# Patient Record
Sex: Male | Born: 1955 | Race: White | Hispanic: No | Marital: Married | Smoking: Former smoker
Health system: Southern US, Community
[De-identification: ages and names within clinical notes are randomized; demographics above are authoritative.]

## PROBLEM LIST (undated history)

## (undated) DIAGNOSIS — I2510 Atherosclerotic heart disease of native coronary artery without angina pectoris: Secondary | ICD-10-CM

## (undated) DIAGNOSIS — M75100 Unspecified rotator cuff tear or rupture of unspecified shoulder, not specified as traumatic: Secondary | ICD-10-CM

## (undated) DIAGNOSIS — G4733 Obstructive sleep apnea (adult) (pediatric): Secondary | ICD-10-CM

## (undated) DIAGNOSIS — Z8719 Personal history of other diseases of the digestive system: Secondary | ICD-10-CM

## (undated) DIAGNOSIS — T884XXA Failed or difficult intubation, initial encounter: Secondary | ICD-10-CM

## (undated) DIAGNOSIS — R0602 Shortness of breath: Secondary | ICD-10-CM

## (undated) DIAGNOSIS — J302 Other seasonal allergic rhinitis: Secondary | ICD-10-CM

## (undated) DIAGNOSIS — K219 Gastro-esophageal reflux disease without esophagitis: Secondary | ICD-10-CM

## (undated) DIAGNOSIS — T7840XA Allergy, unspecified, initial encounter: Secondary | ICD-10-CM

## (undated) DIAGNOSIS — I10 Essential (primary) hypertension: Secondary | ICD-10-CM

## (undated) DIAGNOSIS — Z8489 Family history of other specified conditions: Secondary | ICD-10-CM

## (undated) DIAGNOSIS — H269 Unspecified cataract: Secondary | ICD-10-CM

## (undated) DIAGNOSIS — E785 Hyperlipidemia, unspecified: Secondary | ICD-10-CM

## (undated) DIAGNOSIS — M5090 Cervical disc disorder, unspecified, unspecified cervical region: Secondary | ICD-10-CM

## (undated) DIAGNOSIS — I714 Abdominal aortic aneurysm, without rupture: Secondary | ICD-10-CM

## (undated) DIAGNOSIS — M1990 Unspecified osteoarthritis, unspecified site: Secondary | ICD-10-CM

## (undated) DIAGNOSIS — G4730 Sleep apnea, unspecified: Secondary | ICD-10-CM

## (undated) DIAGNOSIS — H9190 Unspecified hearing loss, unspecified ear: Secondary | ICD-10-CM

## (undated) DIAGNOSIS — K760 Fatty (change of) liver, not elsewhere classified: Secondary | ICD-10-CM

## (undated) HISTORY — PX: VASECTOMY: SHX75

## (undated) HISTORY — PX: HERNIA REPAIR: SHX51

## (undated) HISTORY — DX: Sleep apnea, unspecified: G47.30

## (undated) HISTORY — DX: Unspecified hearing loss, unspecified ear: H91.90

## (undated) HISTORY — PX: MENISCUS REPAIR: SHX5179

## (undated) HISTORY — DX: Atherosclerotic heart disease of native coronary artery without angina pectoris: I25.10

## (undated) HISTORY — PX: OTHER SURGICAL HISTORY: SHX169

## (undated) HISTORY — DX: Essential (primary) hypertension: I10

## (undated) HISTORY — DX: Allergy, unspecified, initial encounter: T78.40XA

## (undated) HISTORY — DX: Hyperlipidemia, unspecified: E78.5

## (undated) HISTORY — DX: Unspecified cataract: H26.9

## (undated) HISTORY — DX: Morbid (severe) obesity due to excess calories: E66.01

## (undated) HISTORY — DX: Obstructive sleep apnea (adult) (pediatric): G47.33

## (undated) HISTORY — DX: Cervical disc disorder, unspecified, unspecified cervical region: M50.90

## (undated) HISTORY — DX: Unspecified rotator cuff tear or rupture of unspecified shoulder, not specified as traumatic: M75.100

## (undated) HISTORY — DX: Abdominal aortic aneurysm, without rupture: I71.4

## (undated) HISTORY — PX: CARDIAC CATHETERIZATION: SHX172

## (undated) HISTORY — DX: Fatty (change of) liver, not elsewhere classified: K76.0

---

## 2000-12-17 ENCOUNTER — Encounter (INDEPENDENT_AMBULATORY_CARE_PROVIDER_SITE_OTHER): Payer: Self-pay | Admitting: *Deleted

## 2000-12-17 ENCOUNTER — Ambulatory Visit (HOSPITAL_BASED_OUTPATIENT_CLINIC_OR_DEPARTMENT_OTHER): Admission: RE | Admit: 2000-12-17 | Discharge: 2000-12-17 | Payer: Self-pay | Admitting: Urology

## 2002-08-28 ENCOUNTER — Ambulatory Visit (HOSPITAL_COMMUNITY): Admission: RE | Admit: 2002-08-28 | Discharge: 2002-08-28 | Payer: Self-pay | Admitting: *Deleted

## 2002-08-28 ENCOUNTER — Encounter (INDEPENDENT_AMBULATORY_CARE_PROVIDER_SITE_OTHER): Payer: Self-pay | Admitting: Specialist

## 2006-09-12 ENCOUNTER — Ambulatory Visit (HOSPITAL_COMMUNITY): Admission: RE | Admit: 2006-09-12 | Discharge: 2006-09-12 | Payer: Self-pay | Admitting: Internal Medicine

## 2009-07-19 ENCOUNTER — Encounter: Admission: RE | Admit: 2009-07-19 | Discharge: 2009-07-19 | Payer: Self-pay | Admitting: Cardiology

## 2009-07-20 ENCOUNTER — Encounter: Payer: Self-pay | Admitting: Cardiology

## 2009-07-27 ENCOUNTER — Encounter: Payer: Self-pay | Admitting: Cardiology

## 2009-11-17 ENCOUNTER — Ambulatory Visit (HOSPITAL_COMMUNITY): Admission: RE | Admit: 2009-11-17 | Discharge: 2009-11-17 | Payer: Self-pay | Admitting: Interventional Cardiology

## 2009-12-29 ENCOUNTER — Encounter: Admission: RE | Admit: 2009-12-29 | Discharge: 2009-12-29 | Payer: Self-pay | Admitting: Gastroenterology

## 2010-03-01 ENCOUNTER — Encounter: Admission: RE | Admit: 2010-03-01 | Discharge: 2010-03-01 | Payer: Self-pay | Admitting: Neurosurgery

## 2010-07-09 ENCOUNTER — Encounter: Payer: Self-pay | Admitting: Internal Medicine

## 2010-07-10 ENCOUNTER — Encounter: Payer: Self-pay | Admitting: Internal Medicine

## 2010-09-04 LAB — GLUCOSE, CAPILLARY
Glucose-Capillary: 151 mg/dL — ABNORMAL HIGH (ref 70–99)
Glucose-Capillary: 154 mg/dL — ABNORMAL HIGH (ref 70–99)

## 2010-11-03 NOTE — Op Note (Signed)
[name]  Patient:    [name], [name]                       MRN: [id number] Proc. Date: 12/17/00 Attending:  Verl Dicker, M.D.                           Operative Report  DATE OF BIRTH: 1955-08-18  INDICATIONS FOR PROCEDURE: This is a 55 year old male with undesired fertility.  The patient had a thorough preoperative consultation with Dr. Wanda Plump and while on the examination table it was demonstrated to the patient the maneuver involved to bring the vas to the skin.  Most likely related to his body habitus, 5 feet 11 inches and 307 pounds, this was very uncomfortable for the patient and he was "unable to lay still" for office vasectomy and requested intravenous sedation to complete the procedure, and I agreed.  SURGEON: Verl Dicker, M.D.  DESCRIPTION OF PROCEDURE: The patient was prepped and draped in the supine position after institution of an adequate level of intravenous sedation.  The vas was brought to the anterior skin of the left hemiscrotum and the subcu and cord were infiltrated with lidocaine 0.25% without epinephrine.  A transverse incision was carried through the skin and dartos muscle and surrounding subcutaneous attachments were freed using the needle tip hemostat.  The vas was grasped with ring forceps, divided longitudinally as it lifted out of its sheath, and a 1.5 cm segment of the vas was then excised.  A portion of the vas proximally was then sewn back on itself with a Singer suture of 3-0 chromic.  The distal end of the vas was cauterized by placing a needle tip Bovie down the lumen and cauterizing for a distance of about 1-1.5 cm.  The dartos was closed with a figure-of-eight stitch of 3-0 chromic and the skin closed with a figure-of-eight stitch of 3-0 chromic.  Identical technique was used on both the right and left sides and the wound covered with an athletic supporter and ice bag.  The  patient was returned to recovery in satisfactory condition. DD:  12/17/00 TD:  12/17/00 Job: [id number]

## 2011-08-24 ENCOUNTER — Telehealth: Payer: Self-pay

## 2011-08-24 NOTE — Telephone Encounter (Signed)
Message below is from Dr. Netta Corrigan patient.  I have changed to [employer] at work.don't know if that matters. Can you help me get 6 month " single refills" (all at the same time) on the following. I get these two with without any insurance and save money at [employer] on [employer] in 6 month supply. Lisinopril-HCTZ 20-12.5 MG LUP 2 x daily ( 360 tablets= 6 month supply) Amlodipine Besylate 5 MG (ZY) 1 x daily (180 tablets = 6 month supply) I was buying this one (Januvia) using [employer].NO LONGER I am hopeful cheapest way is to buy using BCBS at [facility] for .I don't know the answer..one month, three months, or six months. need your help to figure it out Januvia Tabs 100MG  1 x daily (It would be great to manage in 6 months.don't know if we can.) I "think" BCBS is $30 per month co-pay. As always thanks for your great help, [name] Cell [phone] DOB March 24, 2056

## 2011-08-28 NOTE — Telephone Encounter (Signed)
Chart in your box. Patient has an appt tomorrow.

## 2011-08-28 NOTE — Telephone Encounter (Signed)
Need chart and I'll do this later today

## 2011-08-29 ENCOUNTER — Encounter: Payer: Self-pay | Admitting: Internal Medicine

## 2011-08-29 ENCOUNTER — Ambulatory Visit (INDEPENDENT_AMBULATORY_CARE_PROVIDER_SITE_OTHER): Payer: BC Managed Care – PPO | Admitting: Internal Medicine

## 2011-08-29 VITALS — BP 132/90 | HR 63 | Temp 98.2°F | Resp 16 | Ht 71.0 in | Wt 309.0 lb

## 2011-08-29 DIAGNOSIS — E785 Hyperlipidemia, unspecified: Secondary | ICD-10-CM

## 2011-08-29 DIAGNOSIS — M25559 Pain in unspecified hip: Secondary | ICD-10-CM

## 2011-08-29 DIAGNOSIS — E1159 Type 2 diabetes mellitus with other circulatory complications: Secondary | ICD-10-CM | POA: Insufficient documentation

## 2011-08-29 DIAGNOSIS — Z6839 Body mass index (BMI) 39.0-39.9, adult: Secondary | ICD-10-CM

## 2011-08-29 DIAGNOSIS — D239 Other benign neoplasm of skin, unspecified: Secondary | ICD-10-CM

## 2011-08-29 DIAGNOSIS — R05 Cough: Secondary | ICD-10-CM

## 2011-08-29 DIAGNOSIS — IMO0002 Reserved for concepts with insufficient information to code with codable children: Secondary | ICD-10-CM

## 2011-08-29 DIAGNOSIS — E119 Type 2 diabetes mellitus without complications: Secondary | ICD-10-CM | POA: Insufficient documentation

## 2011-08-29 DIAGNOSIS — E1169 Type 2 diabetes mellitus with other specified complication: Secondary | ICD-10-CM | POA: Insufficient documentation

## 2011-08-29 DIAGNOSIS — I10 Essential (primary) hypertension: Secondary | ICD-10-CM

## 2011-08-29 DIAGNOSIS — Z6841 Body Mass Index (BMI) 40.0 and over, adult: Secondary | ICD-10-CM | POA: Insufficient documentation

## 2011-08-29 DIAGNOSIS — R059 Cough, unspecified: Secondary | ICD-10-CM

## 2011-08-29 LAB — POCT GLYCOSYLATED HEMOGLOBIN (HGB A1C): Hemoglobin A1C: 10.9

## 2011-08-29 MED ORDER — FLUTICASONE PROPIONATE 50 MCG/ACT NA SUSP: 2.0000 | Freq: Every day | NASAL | Status: DC

## 2011-08-29 MED ORDER — SITAGLIPTIN PHOSPHATE 100 MG PO TABS: 100.0000 mg | ORAL_TABLET | Freq: Every day | ORAL | Status: DC

## 2011-08-29 MED ORDER — SIMVASTATIN 20 MG PO TABS: 20.0000 mg | ORAL_TABLET | Freq: Every evening | ORAL | Status: DC

## 2011-08-29 MED ORDER — AMLODIPINE BESYLATE 5 MG PO TABS: 5.0000 mg | ORAL_TABLET | Freq: Every day | ORAL | Status: DC

## 2011-08-29 MED ORDER — METOPROLOL TARTRATE 100 MG PO TABS
100.0000 mg | ORAL_TABLET | Freq: Two times a day (BID) | ORAL | Status: DC
Start: 2011-08-29 — End: 2011-12-26

## 2011-08-29 MED ORDER — FLUOCINONIDE-E 0.05 % EX CREA: TOPICAL_CREAM | Freq: Two times a day (BID) | CUTANEOUS | Status: AC

## 2011-08-29 MED ORDER — LISINOPRIL-HYDROCHLOROTHIAZIDE 20-12.5 MG PO TABS: 1.0000 | ORAL_TABLET | Freq: Every day | ORAL | Status: DC

## 2011-08-29 NOTE — Progress Notes (Signed)
  Subjective:    Patient ID: [name], male    DOB: Feb 19, 1956, 56 y.o.   MRN: [id number]  HPI Patient Active Problem List  Diagnoses  . Type 2 diabetes mellitus  . Hypertension  . Hyperlipidemia  . BMI 40.0-44.9, adult  Here for followup labs in several new problems Complaining of a cough for 2 weeks that is nonproductive, occasional, but irritating. Also note some mild postnasal drip but no concrete allergy symptoms. His left shoulder pain following a rotator cuff tear he is now very episodic and doesn't interfere with his lifestyle. His left hip has an occasional pop as he goes from sitting to standing but no pain affecting his gait or sleep. He has diffuse skin lesions to look at. Over the past 2 months he's developed a bloating sensation with lots of gas at 9:00 every night. He had a recent evaluation by Dr. Madilyn Fireman for what was thought to be laryngeal pharyngeal reflux, but he was just put on samples of DEXA Lien and seems better. No studies were done. He has not been able to lose weight. He continues to he would ever he wants and drink whatever he wants including a lot of wine. His CPAP has not been adjusted recently and he is to his knowledge working well. However he reports afternoon hypersomnolence and sleepiness with driving.      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      Assessment & Plan:   Patient Active Problem List  Diagnoses  . Type 2 diabetes mellitus  . Hypertension  . Hyperlipidemia  . BMI 40.0-44.9, adult  Routine labs are assessed Results for orders placed in visit on 08/29/11  POCT GLYCOSYLATED HEMOGLOBIN (HGB A1C)      Component Value Range   Hemoglobin A1C 10.9    Others are sent out Obviously has lost control of his diabetes/with these medications he was in good control as long as he stuck to his diet/for now we will emphasize this again and offor the addition of insulin is nextt then recheck in 3 months He suggests that at age 49 he will pay more attention, but I think he must focus on weight loss at this point. Problem #5 cough secondary to allergic rhinitis-he should start the nasal spray Flonase Problem #6 gastrointestinal gas? Dietary intolerance-advised to start probiotics and look for what may be the offending substance Problem #7 obstructive sleep apnea-may need pulmonary reevaluation Problem #8 eczematous skin lesions-trial of Lidex plus moisturizer and if fails we'll need a biopsy  45 minute visit

## 2011-08-30 ENCOUNTER — Other Ambulatory Visit: Payer: Self-pay

## 2011-08-30 DIAGNOSIS — E119 Type 2 diabetes mellitus without complications: Secondary | ICD-10-CM

## 2011-08-30 DIAGNOSIS — I10 Essential (primary) hypertension: Secondary | ICD-10-CM

## 2011-08-30 LAB — CBC WITH DIFFERENTIAL/PLATELET
Basophils Absolute: 0 10*3/uL (ref 0.0–0.1)
Basophils Relative: 0 % (ref 0–1)
Eosinophils Absolute: 0.1 10*3/uL (ref 0.0–0.7)
Eosinophils Relative: 1 % (ref 0–5)
HCT: 45.2 % (ref 39.0–52.0)
Hemoglobin: 15.7 g/dL (ref 13.0–17.0)
Lymphocytes Relative: 31 % (ref 12–46)
Lymphs Abs: 1.3 10*3/uL (ref 0.7–4.0)
MCH: 31.6 pg (ref 26.0–34.0)
MCHC: 34.7 g/dL (ref 30.0–36.0)
MCV: 90.9 fL (ref 78.0–100.0)
Monocytes Absolute: 0.4 10*3/uL (ref 0.1–1.0)
Monocytes Relative: 9 % (ref 3–12)
Neutro Abs: 2.5 10*3/uL (ref 1.7–7.7)
Neutrophils Relative %: 59 % (ref 43–77)
Platelets: 145 10*3/uL — ABNORMAL LOW (ref 150–400)
RBC: 4.97 MIL/uL (ref 4.22–5.81)
RDW: 12.4 % (ref 11.5–15.5)
WBC: 4.2 10*3/uL (ref 4.0–10.5)

## 2011-08-30 LAB — LIPID PANEL
Cholesterol: 197 mg/dL (ref 0–200)
HDL: 37 mg/dL — ABNORMAL LOW (ref >39)
LDL Cholesterol: 100 mg/dL — ABNORMAL HIGH (ref 0–99)
Total CHOL/HDL Ratio: 5.3 Ratio
Triglycerides: 298 mg/dL — ABNORMAL HIGH (ref <150)
VLDL: 60 mg/dL — ABNORMAL HIGH (ref 0–40)

## 2011-08-30 LAB — COMPREHENSIVE METABOLIC PANEL
ALT: 38 U/L (ref 0–53)
AST: 40 U/L — ABNORMAL HIGH (ref 0–37)
Albumin: 4.4 g/dL (ref 3.5–5.2)
Alkaline Phosphatase: 52 U/L (ref 39–117)
BUN: 15 mg/dL (ref 6–23)
CO2: 26 mEq/L (ref 19–32)
Calcium: 8.7 mg/dL (ref 8.4–10.5)
Chloride: 98 mEq/L (ref 96–112)
Creat: 1.16 mg/dL (ref 0.50–1.35)
Glucose, Bld: 268 mg/dL — ABNORMAL HIGH (ref 70–99)
Potassium: 3.5 mEq/L (ref 3.5–5.3)
Sodium: 136 mEq/L (ref 135–145)
Total Bilirubin: 0.7 mg/dL (ref 0.3–1.2)
Total Protein: 6.3 g/dL (ref 6.0–8.3)

## 2011-08-30 LAB — TSH: TSH: 2.081 u[IU]/mL (ref 0.350–4.500)

## 2011-08-30 MED ORDER — SITAGLIPTIN PHOSPHATE 50 MG PO TABS: 50.0000 mg | ORAL_TABLET | Freq: Every day | ORAL | Status: DC

## 2011-08-30 MED ORDER — AMLODIPINE BESYLATE 5 MG PO TABS: 5.0000 mg | ORAL_TABLET | Freq: Every day | ORAL | Status: DC

## 2011-08-30 MED ORDER — LISINOPRIL-HYDROCHLOROTHIAZIDE 20-12.5 MG PO TABS: 1.0000 | ORAL_TABLET | Freq: Every day | ORAL | Status: DC

## 2011-08-31 ENCOUNTER — Encounter: Payer: Self-pay | Admitting: Internal Medicine

## 2011-09-03 ENCOUNTER — Telehealth: Payer: Self-pay

## 2011-09-03 NOTE — Telephone Encounter (Signed)
Dr. Merla Riches:  [place] E-mailed the following information... Hey Doc, The little cough and slight sore throat have now turned into sore throat and coughing up yellow/brown crud.yuck. I am taking one tablet MucinexDM (expectorant and cough suppressant) every 12 hours and inbetween Delsym syrup 10ml. I continue to take my morning 10mg . of Loratadine. Any concern with the Januvia that I am taking? 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 ..maybe Metformin again.but want to hear your thoughts first. [email or web address] August 30, 2011 -- The FDA is looking into an increased risk of pancreatitis and precancerous changes to the pancreas from widely used drugs to treat type 2 diabetes. The medications are called incretin mimetics, which mimic a natural hormone in the body that tells the pancreas to release more insulin after eating. Insulin, in turn, lowers the person's blood sugar. Included in this class are the drugs Bydureon, Byetta, Janumet, Janumet XR, Januvia, Juvisync, [place], Kombiglyze XR, [place], [place], [place], [place], and [place]. Back to me-- Thanks for help on both and let me know thoughts,   I let him know you were out of town for a week and he said that he just wants me to give you this information when you get back from out of town.  Thanks, [employer]

## 2011-09-12 ENCOUNTER — Encounter: Payer: Self-pay | Admitting: Internal Medicine

## 2011-11-27 ENCOUNTER — Other Ambulatory Visit: Payer: Self-pay | Admitting: Internal Medicine

## 2011-11-27 MED ORDER — LISINOPRIL-HYDROCHLOROTHIAZIDE 20-12.5 MG PO TABS: 2.0000 | ORAL_TABLET | Freq: Every day | ORAL | Status: DC

## 2011-11-27 NOTE — Telephone Encounter (Signed)
Please see the E-mail request below from this patient..Also, please note that the pharmacy has already sent a surescripts request for a refill but the way they requested it will not get him as many pills as he needs.  He needs it written as 2 times per day so he can get 360 pills at a time.  I need one drug called in at [facility] [phone] for quantity 180 for Lisinopril HCTZ 20/12.5mg  LUP (current is Prinzide 20/12.5 Tab) I will call them and request that they call you guys. The bottle says one daily.Dr. Merla Riches changed to two daily.so that may make it ok for [facility] to refill with 360 quantity. I have a conflict with my appointment to see Dr. Merla Riches tomorrow. (Wed) Please forward the attached spreadsheet along with a copy of this email to Dr. Merla Riches which will update him, show weight loss..etc. I called and rescheduled for Wed. July 10 with Jacki Cones right behind me. 12:15 and 12:30 I think. She might need to come in earlier for blood work as both of her meds need refill but can not be without your help. One note of some importance is I am experimenting with not taking Januvia.Marland Kitchenso far so good. My last A1C was way to high. I am down roughly 15 to 20 pounds with only another 90 to go. J Exercise is next. I have decreased red wine consumption along with decrease in carbs. So far no low blood sugar readings which is cool. See attached spread and simple copy paste below of recent readings for blood sugar and blood pressure. (don't know if you guys can open spreadsheets) Thanks and as always you guys are the best, Pepe Current drugs being taken also below. November 27, 2011 Current prescription medicines taken each morning:- note One tablet daily Januvia 100mg  tablet Stopped taking April 16. Two tablets daily Lisinopril HCTZ 20/12.5mg  LUP (Zestoretic) blood pressure-ACE inhibitor with diuretic added) One tablet Metoprolol 100MG  CAR (Lopressor) One tablet daily Simvastatin (Zocor) 20mg  NOR (high  cholesterol)  One tablet Amlodipine Besylate (ZY) (Norvasc) 5 mg  One tablet Loratadine 10mg  (help with sinus congestion) One tablet baby aspirin 81mg  (blood pressure and helping heart) Over the counter vitamins---- taken each morning: Two chewables- Vitafusion Mutli Vites Gummy Vitamins for adults Two chewable tablets- [place] Vitamin C 500mg  One NatureMade Flaxseed Oil 1400mg  Softgel Recent blood sugar, blood pressure and weight-- Date Time Weight blood sug blood press 09/17/2011 10:00AM 305 267 125/87 69  09/17/2011 5:40PM 305 173 131/83 83  09/18/2011 8:10PM 306 205 152/102 75  09/19/2011 11:20PM 305 173 129/85 72  09/20/2011 9:20AM 304 230 137/89 78  09/21/2011 4:40PM 302 167 133/94 73  09/21/2011 10:20PM 302 167 116/73 76  09/22/2011 9:20AM 302 223 142/85 69  09/23/2011 11:50AM 301 157 145/98 64  09/23/2011 5:00PM 301 157 133/88 68  09/24/2011 8:30AM 301 208 135/83 66  09/24/2011 5:00PM 301 166 124/84 71  09/25/2011 9:00AM 299 184 135/84 78  09/26/2011 4:10PM 300 146 130/85 73  09/27/2011 9:00AM 300 210 122/81 73  09/27/2011 7:20PM 300 217 113/68 71  09/30/2011 6:20PM 303 146 142/87 81  10/01/2011 10:10AM 303 212 142/95 67  10/01/2011 5:00PM 303 156 130/87 72  10/02/2011 1:50PM 301 180 128/80 62  10/02/2011 8:20PM 301 177 132/88 73  10/03/2011 8:10AM 299 172 127/87 69  10/03/2011 9:40AM 299 211 125/85 64  10/03/2011 10:00PM 299 145 128/90 76  10/04/2011 8:30AM 298 227 127/83 64  10/04/2011 9:40AM 298 174 129/84 70  10/04/2011 6:20PM 298  178 123/76 85  10/05/2011 4:10PM 297 141 120/76 72  10/05/2011 9:10PM 298 168 123/85 73  10/06/2011 11:00AM 297 127 121/87 70  10/06/2011 10:40PM 297 153 145/88 70  10/07/2011 10:20AM 297 187 120/83 62  10/07/2011 12:00PM 297 153 134/88 64  10/07/2011 6:10PM 297 146 127/82 68  10/07/2011 8:30PM 297 152 122/80 67  10/08/2011 10:10AM 296 142 113/77 74  10/08/2011 8:40PM 296 147 136/83 86  10/09/2011 9:40AM 296 169 130/82 62  10/09/2011 11:50PM 295 123 127/85 70  10/10/2011 10:00AM  295 171 128/82 65  10/10/2011 6:00PM 295 128 126/83 72  10/10/2011 11:20PM 295 145 124/78 68  10/11/2011 11:00PM 294 127 122/81 75  10/12/2011 8:30AM 293 175 123/82 66  10/12/2011 6:00PM 293 121 122/83 77  10/12/2011 8:00PM 293 163 127/85 73  10/13/2011 3:10PM 292 148 129/78 68  10/13/2011 11:00PM 292 154 131/82 70  10/14/2011 9:20AM 292 139 133/88 65  10/14/2011 2:10PM 291 116 123/80 70  10/14/2011 8:10PM 294 180 131/83 82  10/14/2011 10:50PM 294 138 138/92 66  10/15/2011 11:00AM 291 151 132/86 67  10/15/2011 5:30PM 291 119 125/79 74  10/15/2011 7:40PM 291 157 125/77 75  10/16/2011 10:20AM 291 203 121/79 60  10/17/2011 9:00AM 290 169 123/82 67  10/17/2011 8:40PM 290 118 124/80 81  10/17/2011 10:40PM 290 136 128/86 75  10/19/2011 8:00AM 291 149 121/81 74  10/19/2011 8:20AM 291 183 122/79 64  10/19/2011 6:00PM 291 117 124/82 77  10/19/2011 11:50PM 291 129 138/90 63  10/20/2011 1:00PM 290 119 117/73 70  10/20/2011 3:00PM 290 116 123/72 76  10/20/2011 8:00PM 290 211 129/76 75  10/20/2011 11:30PM 290 136 133/93 68  10/21/2011 10:10AM 290 182 121/74 68  10/21/2011 9:50PM 291 123 132/81 83  10/22/2011 9:40AM 291 170 126/80 62  10/22/2011 8:00PM 291 152 130/83 80  10/23/2011 9:40AM 290 165 114/72 62  10/23/2011 7:10PM 290 147 123/80 72  10/24/2011 8:50AM 290 142 115/72 67  10/24/2011 11:00PM 290 121 138/89 66  10/25/2011 8:50AM 289 188 132/89 64  10/25/2011 5:20PM 289 128 110/69 81  10/26/2011 9:10AM 289 207 133/85 60  10/26/2011 11:00PM 290 130 109/65 82  10/27/2011 10:20AM 287 135 128/75 71  10/27/2011 7:50PM 287 178 120/73 75  10/27/2011 9:20PM 287 214 120/73 75  10/27/2011 11:30PM 287 165 110/70 68  10/28/2011 2:30PM 288 184 111/74 71  10/28/2011 8:40PM 288 152 113/71 69  10/29/2011 10:00AM 289 206 115/77 61  10/30/2011 9:00PM 289 140 126/83 71  10/31/2011 8:40PM 288 113 119/72 70  11/01/2011 9:30AM 289 179 115/75 63  11/04/2011 6:40PM 290 118 136/80 69  11/06/2011 4:40PM 289 130 130/79 64  11/07/2011 9:50PM 288 174 117/73 78  11/08/2011  7:40PM 287 152 121/80 79  11/09/2011 3:00PM 287 228 126/74 68  11/10/2011 12:30AM 287 131 103/61 75  11/10/2011 9:30PM 288 153 123/81 71  11/11/2011 11:00AM 288 136 118/71 60  11/12/2011 2:40PM 287 280 118/66 69  11/12/2011 10:00PM 287 124 109/60 81  11/14/2011 9:40AM 287 187 115/73 64  11/15/2011 10:10AM 286 198 118/79 63  11/15/2011 8:00PM 287 214 122/79 71  11/16/2011 9:30AM 287 194 124/80 61  11/17/2011 9:00AM 286 146 109/65 72  11/18/2011 9:50AM 287 187 123/81 69  11/18/2011 8:00PM 287 165 116/77 80  11/19/2011 10:00PM 287 185 127/76 77  11/21/2011 8:30AM 286 197 122/79 71  11/22/2011 9:30AM 286 240 126/80 59  11/23/2011 9:30AM 287 227 116/79 67  11/23/2011 9:50PM 287 232 126/77 89  11/24/2011 12:40PM 287 159 116/75 66

## 2011-11-27 NOTE — Telephone Encounter (Signed)
Sent Rx of lisinopril hct at bid per patient's instructions. Fwd rest of msg to Dr. Merla Riches to review

## 2011-11-28 ENCOUNTER — Ambulatory Visit: Payer: BC Managed Care – PPO | Admitting: Internal Medicine

## 2011-12-26 ENCOUNTER — Ambulatory Visit (INDEPENDENT_AMBULATORY_CARE_PROVIDER_SITE_OTHER): Payer: BC Managed Care – PPO | Admitting: Internal Medicine

## 2011-12-26 ENCOUNTER — Encounter: Payer: Self-pay | Admitting: Internal Medicine

## 2011-12-26 VITALS — BP 131/86 | HR 64 | Temp 98.3°F | Resp 16 | Ht 71.0 in | Wt 294.6 lb

## 2011-12-26 DIAGNOSIS — R351 Nocturia: Secondary | ICD-10-CM

## 2011-12-26 DIAGNOSIS — E119 Type 2 diabetes mellitus without complications: Secondary | ICD-10-CM

## 2011-12-26 DIAGNOSIS — Z6841 Body Mass Index (BMI) 40.0 and over, adult: Secondary | ICD-10-CM

## 2011-12-26 DIAGNOSIS — M5090 Cervical disc disorder, unspecified, unspecified cervical region: Secondary | ICD-10-CM

## 2011-12-26 DIAGNOSIS — I10 Essential (primary) hypertension: Secondary | ICD-10-CM

## 2011-12-26 DIAGNOSIS — M549 Dorsalgia, unspecified: Secondary | ICD-10-CM | POA: Insufficient documentation

## 2011-12-26 DIAGNOSIS — G4733 Obstructive sleep apnea (adult) (pediatric): Secondary | ICD-10-CM | POA: Insufficient documentation

## 2011-12-26 DIAGNOSIS — E785 Hyperlipidemia, unspecified: Secondary | ICD-10-CM

## 2011-12-26 HISTORY — DX: Cervical disc disorder, unspecified, unspecified cervical region: M50.90

## 2011-12-26 LAB — CBC WITH DIFFERENTIAL/PLATELET
Basophils Absolute: 0 10*3/uL (ref 0.0–0.1)
Basophils Relative: 0 % (ref 0–1)
Eosinophils Absolute: 0.1 10*3/uL (ref 0.0–0.7)
Eosinophils Relative: 2 % (ref 0–5)
HCT: 44.5 % (ref 39.0–52.0)
Hemoglobin: 15.1 g/dL (ref 13.0–17.0)
Lymphocytes Relative: 35 % (ref 12–46)
Lymphs Abs: 1.7 10*3/uL (ref 0.7–4.0)
MCH: 31.2 pg (ref 26.0–34.0)
MCHC: 33.9 g/dL (ref 30.0–36.0)
MCV: 91.9 fL (ref 78.0–100.0)
Monocytes Absolute: 0.4 10*3/uL (ref 0.1–1.0)
Monocytes Relative: 9 % (ref 3–12)
Neutro Abs: 2.6 10*3/uL (ref 1.7–7.7)
Neutrophils Relative %: 54 % (ref 43–77)
Platelets: 182 10*3/uL (ref 150–400)
RBC: 4.84 MIL/uL (ref 4.22–5.81)
RDW: 13.5 % (ref 11.5–15.5)
WBC: 4.8 10*3/uL (ref 4.0–10.5)

## 2011-12-26 LAB — COMPREHENSIVE METABOLIC PANEL
ALT: 24 U/L (ref 0–53)
AST: 27 U/L (ref 0–37)
Albumin: 4.2 g/dL (ref 3.5–5.2)
Alkaline Phosphatase: 43 U/L (ref 39–117)
BUN: 17 mg/dL (ref 6–23)
CO2: 31 mEq/L (ref 19–32)
Calcium: 8.9 mg/dL (ref 8.4–10.5)
Chloride: 99 mEq/L (ref 96–112)
Creat: 1.21 mg/dL (ref 0.50–1.35)
Glucose, Bld: 118 mg/dL — ABNORMAL HIGH (ref 70–99)
Potassium: 3.8 mEq/L (ref 3.5–5.3)
Sodium: 138 mEq/L (ref 135–145)
Total Bilirubin: 0.6 mg/dL (ref 0.3–1.2)
Total Protein: 6.3 g/dL (ref 6.0–8.3)

## 2011-12-26 LAB — LIPID PANEL
Cholesterol: 165 mg/dL (ref 0–200)
HDL: 38 mg/dL — ABNORMAL LOW (ref >39)
LDL Cholesterol: 100 mg/dL — ABNORMAL HIGH (ref 0–99)
Total CHOL/HDL Ratio: 4.3 Ratio
Triglycerides: 133 mg/dL (ref <150)
VLDL: 27 mg/dL (ref 0–40)

## 2011-12-26 LAB — POCT GLYCOSYLATED HEMOGLOBIN (HGB A1C): Hemoglobin A1C: 6.1

## 2011-12-26 NOTE — Progress Notes (Signed)
Subjective:    Patient ID: [name], male    DOB: 12-07-1955, 56 y.o.   MRN: [id number]  HPI Patient Active Problem List  Diagnosis  . Type 2 diabetes mellitus-decided to stop Januvia as didn't see effect Home BS better with wt loss but still may be over 200 carbs hurt Still drinks excessive wine tho says he skips days now  . Hypertension- home BP as low as 110/60 Edema at end of day-gone by am  . Hyperlipidemia  . BMI 40.0-44.9, adult=15lbs loss    -  OSA-CPAP working   -  Back Pain-needing NSAIDs-mostly at night/Past history was significant neck and low back problems/was on narcotics for years but tries to avoid them that     Review of Systems  Constitutional: Negative for fatigue.  Eyes: Negative for visual disturbance.  Respiratory: Negative for shortness of breath.   Cardiovascular: Negative for chest pain and palpitations.  Gastrointestinal: Negative for abdominal pain.  Genitourinary: Positive for frequency and difficulty urinating.       Very occasional symptoms-desires PSA  Neurological: Negative for tremors and weakness.  Psychiatric/Behavioral: Negative for confusion and decreased concentration.       Working on Programmer, multimedia for pilot's lisc       Objective:   Physical Exam  Constitutional: He is oriented to person, place, and time.       obese  Eyes: Conjunctivae and EOM are normal. Pupils are equal, round, and reactive to light.  Neck: No thyromegaly present.  Cardiovascular: Normal rate and regular rhythm.   Pulmonary/Chest: Effort normal.  Musculoskeletal: Normal range of motion. He exhibits no edema.  Neurological: He is alert and oriented to person, place, and time.  Psychiatric: He has a normal mood and affect. Judgment and thought content normal.      Results for orders placed in visit on 12/26/11  POCT GLYCOSYLATED HEMOGLOBIN (HGB A1C)      Component Value Range   Hemoglobin A1C 6.1         Assessment & Plan:   1. Type 2  diabetes mellitus    2. Hypertension    3. Hyperlipidemia    4. BMI 40.0-44.9, adult    5. Nocturia    6. Back pain-chronic DDD     Results for orders placed in visit on 12/26/11  CBC WITH DIFFERENTIAL      Component Value Range   WBC 4.8  4.0 - 10.5 K/uL   RBC 4.84  4.22 - 5.81 MIL/uL   Hemoglobin 15.1  13.0 - 17.0 g/dL   HCT 56.2  13.0 - 86.5 %   MCV 91.9  78.0 - 100.0 fL   MCH 31.2  26.0 - 34.0 pg   MCHC 33.9  30.0 - 36.0 g/dL   RDW 78.4  69.6 - 29.5 %   Platelets 182  150 - 400 K/uL   Neutrophils Relative 54  43 - 77 %   Neutro Abs 2.6  1.7 - 7.7 K/uL   Lymphocytes Relative 35  12 - 46 %   Lymphs Abs 1.7  0.7 - 4.0 K/uL   Monocytes Relative 9  3 - 12 %   Monocytes Absolute 0.4  0.1 - 1.0 K/uL   Eosinophils Relative 2  0 - 5 %   Eosinophils Absolute 0.1  0.0 - 0.7 K/uL   Basophils Relative 0  0 - 1 %   Basophils Absolute 0.0  0.0 - 0.1 K/uL   Smear Review Criteria for review not  met    COMPREHENSIVE METABOLIC PANEL      Component Value Range   Sodium 138  135 - 145 mEq/L   Potassium 3.8  3.5 - 5.3 mEq/L   Chloride 99  96 - 112 mEq/L   CO2 31  19 - 32 mEq/L   Glucose, Bld 118 (*) 70 - 99 mg/dL   BUN 17  6 - 23 mg/dL   Creat 1.61  0.96 - 0.45 mg/dL   Total Bilirubin 0.6  0.3 - 1.2 mg/dL   Alkaline Phosphatase 43  39 - 117 U/L   AST 27  0 - 37 U/L   ALT 24  0 - 53 U/L   Total Protein 6.3  6.0 - 8.3 g/dL   Albumin 4.2  3.5 - 5.2 g/dL   Calcium 8.9  8.4 - 40.9 mg/dL  LIPID PANEL      Component Value Range   Cholesterol 165  0 - 200 mg/dL   Triglycerides 811  <914 mg/dL   HDL 38 (*) >78 mg/dL   Total CHOL/HDL Ratio 4.3     VLDL 27  0 - 40 mg/dL   LDL Cholesterol 295 (*) 0 - 99 mg/dL  POCT GLYCOSYLATED HEMOGLOBIN (HGB A1C)      Component Value Range   Hemoglobin A1C 6.1    PSA      Component Value Range   PSA 1.12  <=4.00 ng/mL  pretty remarkable  Meds ordered this encounter  Medications  . Flaxseed, Linseed, (FLAX SEED OIL PO)    Sig: Take by mouth.  Marland Kitchen  OVER THE COUNTER MEDICATION    Sig: Vitamin C 500 mg chewable  . metoprolol (LOPRESSOR) 100 MG tablet    Sig: Take 100 mg by mouth 1 day or 1 dose.  He has adequate supplies of lisinopril 20/12.5 HCT, metipranolol under, simvastatin 20 mg, and glipizide 2.5 XL. He also has Norvasc 5 mg. He needs a continued focus on weight loss and decreased wine intake.

## 2011-12-27 LAB — PSA: PSA: 1.12 ng/mL (ref <=4.00)

## 2011-12-28 ENCOUNTER — Encounter: Payer: Self-pay | Admitting: Internal Medicine

## 2012-03-14 ENCOUNTER — Other Ambulatory Visit: Payer: Self-pay | Admitting: Internal Medicine

## 2012-03-26 ENCOUNTER — Ambulatory Visit (INDEPENDENT_AMBULATORY_CARE_PROVIDER_SITE_OTHER): Payer: BC Managed Care – PPO | Admitting: Internal Medicine

## 2012-03-26 ENCOUNTER — Encounter: Payer: Self-pay | Admitting: Internal Medicine

## 2012-03-26 VITALS — BP 138/94 | HR 68 | Temp 98.7°F | Resp 16 | Ht 71.0 in | Wt 300.0 lb

## 2012-03-26 DIAGNOSIS — H9190 Unspecified hearing loss, unspecified ear: Secondary | ICD-10-CM | POA: Insufficient documentation

## 2012-03-26 DIAGNOSIS — I10 Essential (primary) hypertension: Secondary | ICD-10-CM

## 2012-03-26 DIAGNOSIS — Z6841 Body Mass Index (BMI) 40.0 and over, adult: Secondary | ICD-10-CM

## 2012-03-26 DIAGNOSIS — G4733 Obstructive sleep apnea (adult) (pediatric): Secondary | ICD-10-CM

## 2012-03-26 DIAGNOSIS — E119 Type 2 diabetes mellitus without complications: Secondary | ICD-10-CM

## 2012-03-26 DIAGNOSIS — E785 Hyperlipidemia, unspecified: Secondary | ICD-10-CM

## 2012-03-26 DIAGNOSIS — M79673 Pain in unspecified foot: Secondary | ICD-10-CM

## 2012-03-26 DIAGNOSIS — M79609 Pain in unspecified limb: Secondary | ICD-10-CM

## 2012-03-26 HISTORY — DX: Unspecified hearing loss, unspecified ear: H91.90

## 2012-03-26 LAB — CBC WITH DIFFERENTIAL/PLATELET
Basophils Absolute: 0 10*3/uL (ref 0.0–0.1)
Basophils Relative: 0 % (ref 0–1)
Eosinophils Absolute: 0.1 10*3/uL (ref 0.0–0.7)
Eosinophils Relative: 2 % (ref 0–5)
HCT: 45.4 % (ref 39.0–52.0)
Hemoglobin: 16.2 g/dL (ref 13.0–17.0)
Lymphocytes Relative: 35 % (ref 12–46)
Lymphs Abs: 1.6 10*3/uL (ref 0.7–4.0)
MCH: 32.1 pg (ref 26.0–34.0)
MCHC: 35.7 g/dL (ref 30.0–36.0)
MCV: 89.9 fL (ref 78.0–100.0)
Monocytes Absolute: 0.4 10*3/uL (ref 0.1–1.0)
Monocytes Relative: 8 % (ref 3–12)
Neutro Abs: 2.6 10*3/uL (ref 1.7–7.7)
Neutrophils Relative %: 55 % (ref 43–77)
Platelets: 197 10*3/uL (ref 150–400)
RBC: 5.05 MIL/uL (ref 4.22–5.81)
RDW: 12.9 % (ref 11.5–15.5)
WBC: 4.7 10*3/uL (ref 4.0–10.5)

## 2012-03-26 LAB — LIPID PANEL
Cholesterol: 217 mg/dL — ABNORMAL HIGH (ref 0–200)
HDL: 38 mg/dL — ABNORMAL LOW (ref >39)
LDL Cholesterol: 124 mg/dL — ABNORMAL HIGH (ref 0–99)
Total CHOL/HDL Ratio: 5.7 Ratio
Triglycerides: 276 mg/dL — ABNORMAL HIGH (ref <150)
VLDL: 55 mg/dL — ABNORMAL HIGH (ref 0–40)

## 2012-03-26 LAB — COMPREHENSIVE METABOLIC PANEL
ALT: 31 U/L (ref 0–53)
AST: 38 U/L — ABNORMAL HIGH (ref 0–37)
Albumin: 4.7 g/dL (ref 3.5–5.2)
Alkaline Phosphatase: 49 U/L (ref 39–117)
BUN: 18 mg/dL (ref 6–23)
CO2: 30 mEq/L (ref 19–32)
Calcium: 9.5 mg/dL (ref 8.4–10.5)
Chloride: 99 mEq/L (ref 96–112)
Creat: 1.19 mg/dL (ref 0.50–1.35)
Glucose, Bld: 134 mg/dL — ABNORMAL HIGH (ref 70–99)
Potassium: 4.1 mEq/L (ref 3.5–5.3)
Sodium: 137 mEq/L (ref 135–145)
Total Bilirubin: 0.6 mg/dL (ref 0.3–1.2)
Total Protein: 7.1 g/dL (ref 6.0–8.3)

## 2012-03-26 LAB — POCT GLYCOSYLATED HEMOGLOBIN (HGB A1C): Hemoglobin A1C: 7.1

## 2012-03-26 NOTE — Progress Notes (Signed)
  Subjective:    Patient ID: [name], male    DOB: 1956-05-31, 56 y.o.   MRN: [id number]  HPI Obstructive sleep apnea responding well to the mask. 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 Madilyn Fireman July 2011 showed redundant colon. The procedure was aborted due to significant looping. Barium enema the was done in followup Please showed tortuous and elongated colon with the recommendation to consider virtual colonoscopy and followup Cardiac cath  Dr. Dwaine Gale in 2011 showed no evidence of obstructive lesions with normal left ventricular function, but with elevated end diastolic blood pressure  Review of Systems Denies chest pain or palpitations No vision changes No dyspnea on exertion No GI or GU complaints Shoulder problems and back problems are stable/Has noticed a new pain in the left foot with weight-bearing for about 6 weeks. No clear injury. No swelling.. Gets worse after weight-bearing. Occasionally stiff in the morning.    Objective:   Physical Exam Filed Vitals:   03/26/12 1148  BP: 138/94  Pulse: 68  Temp: 98.7 F (37.1 C)  Resp: 16   Weight 300 pounds HEENT clear Heart regular without murmur Lungs clear Extremities without sensory loss/no edema Left foot with mild tenderness over the proximal metatarsals #3 and 4/no crepitus and tendon sheath Arch stable/Ankle stable No involvement of the MTPs   Results for orders placed in visit on 03/26/12  POCT GLYCOSYLATED HEMOGLOBIN (HGB A1C)      Component Value Range   Hemoglobin A1C 7.1         Assessment & Plan:   Patient Active Problem List  Diagnosis  . Type 2 diabetes mellitus-Controlled  . Hypertension-Controlled    . Hyperlipidemia--Recheck labs  . BMI 40.0-44.9, adult-Stress need for weight loss  . Back pain-Stable  . OSA (obstructive sleep apnea)-Improvement with therapy  . Cervical disc disease  . Hearing loss-aides    -  Foot pain probably secondary to tendinitis/to use stretching and ice with arch support if desired  Labs to Dr Cleta Alberts With this note for his documentation for FFA approval of license Declines flu vaccine Declines pneumonia vaccine To check tetanus Status at next visit  No medications needed today  May call for refills Followup 3 months

## 2012-03-31 ENCOUNTER — Encounter: Payer: Self-pay | Admitting: Internal Medicine

## 2012-04-27 ENCOUNTER — Telehealth: Payer: Self-pay | Admitting: *Deleted

## 2012-04-27 NOTE — Telephone Encounter (Signed)
Please call pt to let him know that Dr Cleta Alberts has his labs that he needs to do his physical.

## 2012-04-28 ENCOUNTER — Telehealth: Payer: Self-pay

## 2012-04-28 NOTE — Telephone Encounter (Signed)
The patient has to come in for his physical exam .

## 2012-04-28 NOTE — Telephone Encounter (Signed)
Called pt to advise. 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

## 2012-04-28 NOTE — Telephone Encounter (Signed)
Thanks, I called him to advise. Left message for him.

## 2012-05-05 ENCOUNTER — Other Ambulatory Visit: Payer: Self-pay | Admitting: Internal Medicine

## 2012-05-13 ENCOUNTER — Ambulatory Visit: Payer: BC Managed Care – PPO | Admitting: Emergency Medicine

## 2012-05-20 ENCOUNTER — Encounter: Payer: Self-pay | Admitting: Internal Medicine

## 2012-05-20 ENCOUNTER — Ambulatory Visit: Payer: BC Managed Care – PPO | Admitting: Emergency Medicine

## 2012-06-09 ENCOUNTER — Ambulatory Visit (INDEPENDENT_AMBULATORY_CARE_PROVIDER_SITE_OTHER): Payer: BC Managed Care – PPO | Admitting: Family Medicine

## 2012-06-09 ENCOUNTER — Ambulatory Visit: Payer: BC Managed Care – PPO

## 2012-06-09 VITALS — BP 158/89 | HR 100 | Temp 100.2°F | Resp 18 | Ht 72.5 in | Wt 312.0 lb

## 2012-06-09 DIAGNOSIS — R52 Pain, unspecified: Secondary | ICD-10-CM

## 2012-06-09 DIAGNOSIS — R059 Cough, unspecified: Secondary | ICD-10-CM

## 2012-06-09 DIAGNOSIS — R05 Cough: Secondary | ICD-10-CM

## 2012-06-09 DIAGNOSIS — J189 Pneumonia, unspecified organism: Secondary | ICD-10-CM

## 2012-06-09 DIAGNOSIS — J029 Acute pharyngitis, unspecified: Secondary | ICD-10-CM

## 2012-06-09 DIAGNOSIS — R0989 Other specified symptoms and signs involving the circulatory and respiratory systems: Secondary | ICD-10-CM

## 2012-06-09 LAB — POCT INFLUENZA A/B
Influenza A, POC: NEGATIVE
Influenza B, POC: NEGATIVE

## 2012-06-09 MED ORDER — LEVOFLOXACIN 500 MG PO TABS: 500.0000 mg | ORAL_TABLET | Freq: Every day | ORAL | Status: DC

## 2012-06-09 NOTE — Progress Notes (Signed)
[facility]:  Office Visit  Chief Complaint:  Chief Complaint  Patient presents with  . Cough  . Headache  . Nasal Congestion    HPI: [name] is a 56 y.o. male who complains of URI sxs starting last night started having sxs of chest congestion with cough since last night, took 1 benardyl and ibuprofen. Took Mucinex and coughing green productive sputum. + fever  Past Medical History  Diagnosis Date  . Diabetes mellitus   . Hypertension   . Rotator cuff tear   . Dyslipidemia   . Morbid obesity    Past Surgical History  Procedure Date  . Knee surgery   . Vasectomy    History   Social History  . Marital Status: Married    Spouse Name: N/A    Number of Children: N/A  . Years of Education: N/A   Social History Main Topics  . Smoking status: Former Smoker -- 4.0 packs/day for 23 years    Types: Cigarettes    Start date: 07/20/1991  . Smokeless tobacco: None  . Alcohol Use: Yes     Comment: social drinker  RED WINE  . Drug Use: No  . Sexually Active: None   Other Topics Concern  . None   Social History Narrative  . None   History reviewed. No pertinent family history. Allergies  Allergen Reactions  . Penicillins     Upset stomach  . Pravachol     Unsure of reaction   Prior to Admission medications   Medication Sig Start Date End Date Taking? Authorizing Provider  amLODipine (NORVASC) 5 MG tablet TAKE 1 TABLET BY MOUTH ONCE A DAY 03/14/12  Yes Morrell Riddle, PA-C  aspirin 81 MG chewable tablet Chew 81 mg by mouth daily.   Yes Historical Provider, MD  Flaxseed, Linseed, (FLAX SEED OIL PO) Take by mouth.   Yes Historical Provider, MD  fluocinonide-emollient (LIDEX-E) 0.05 % cream Apply topically 2 (two) times daily. 08/29/11 08/28/12 Yes Tonye Pearson, MD  fluticasone (FLONASE) 50 MCG/ACT nasal spray Place 2 sprays into the nose daily. PER PT USE PRN     Into each nostril 08/29/11 08/28/12 Yes Tonye Pearson, MD   lisinopril-hydrochlorothiazide (PRINZIDE,ZESTORETIC) 20-12.5 MG per tablet Take 2 tablets by mouth daily. 11/27/11 11/26/12 Yes Marzella Schlein McClung, PA-C  loratadine (CLARITIN) 10 MG tablet Take 10 mg by mouth daily.   Yes Historical Provider, MD  metoprolol (LOPRESSOR) 100 MG tablet  03/14/12  Yes Morrell Riddle, PA-C  Multiple Vitamin (MULTIVITAMIN) tablet Take 1 tablet by mouth daily.   Yes Historical Provider, MD  OVER THE COUNTER MEDICATION Vitamin C 500 mg chewable   Yes Historical Provider, MD  simvastatin (ZOCOR) 20 MG tablet TAKE 1 TABLET BY MOUTH EVERY EVENING. 05/05/12  Yes Morrell Riddle, PA-C     ROS: The patient denies chills, night sweats, unintentional weight loss, chest pain, palpitations, wheezing, dyspnea on exertion, nausea, vomiting, abdominal pain, dysuria, hematuria, melena, numbness, weakness, or tingling.   All other systems have been reviewed and were otherwise negative with the exception of those mentioned in the HPI and as above.    PHYSICAL EXAM: Filed Vitals:   06/09/12 1420  BP: 158/89  Pulse: 100  Temp: 100.2 F (37.9 C)  Resp: 18   Filed Vitals:   06/09/12 1420  Height: 6' 0.5" (1.842 m)  Weight: 312 lb (141.522 kg)   Body mass index is 41.73 kg/(m^2).  General: Alert,  no acute distress. Obese HEENT:  Normocephalic, atraumatic, oropharynx patent. TM nl, no exudates. + erythematous notrils Cardiovascular:  Regular rate and rhythm, no rubs murmurs or gallops.  No Carotid bruits, radial pulse intact. No pedal edema.  Respiratory: Clear to auscultation bilaterally.  No wheezes, rales, or rhonchi.  No cyanosis, no use of accessory musculature. Decrease BS GI: No organomegaly, abdomen is soft and non-tender, positive bowel sounds.  No masses. Skin: No rashes. Neurologic: Facial musculature symmetric. Psychiatric: Patient is appropriate throughout our interaction. Lymphatic: No cervical lymphadenopathy Musculoskeletal: Gait intact.   LABS: Results for  orders placed in visit on 06/09/12  POCT INFLUENZA A/B      Component Value Range   Influenza A, POC Negative     Influenza B, POC Negative       EKG/XRAY:   Primary read interpreted by Dr. Conley Rolls at [facility]. ? Right lower lobe opacity ? Early onset PNA vs increase vascular markings compared to 07/2009   ASSESSMENT/PLAN: Encounter Diagnoses  Name Primary?  . Cough Yes  . Sore throat   . Chest congestion   . Body aches   . PNA (pneumonia)    Presume PNA based on sxs and ? xray Rx Levaquin 500 mg daily x 7 days OTC mucinex without D F/u prn     Marcellas Marchant PHUONG, DO 06/09/2012 3:46 PM

## 2012-07-30 ENCOUNTER — Encounter: Payer: Self-pay | Admitting: Internal Medicine

## 2012-07-30 ENCOUNTER — Ambulatory Visit (INDEPENDENT_AMBULATORY_CARE_PROVIDER_SITE_OTHER): Payer: BC Managed Care – PPO | Admitting: Internal Medicine

## 2012-07-30 VITALS — BP 122/83 | HR 69 | Temp 98.2°F | Resp 18 | Ht 72.0 in | Wt 302.0 lb

## 2012-07-30 DIAGNOSIS — E119 Type 2 diabetes mellitus without complications: Secondary | ICD-10-CM

## 2012-07-30 DIAGNOSIS — E785 Hyperlipidemia, unspecified: Secondary | ICD-10-CM

## 2012-07-30 DIAGNOSIS — Z6841 Body Mass Index (BMI) 40.0 and over, adult: Secondary | ICD-10-CM

## 2012-07-30 DIAGNOSIS — I10 Essential (primary) hypertension: Secondary | ICD-10-CM

## 2012-07-30 DIAGNOSIS — Z23 Encounter for immunization: Secondary | ICD-10-CM

## 2012-07-30 NOTE — Progress Notes (Signed)
  Subjective:    Patient ID: [name], male    DOB: [birth year], 57 y.o.   MRN: [id number]  HPIf/u Patient Active Problem List  Diagnosis  . Type 2 diabetes mellitus  . Hypertension  . Hyperlipidemia  . BMI 40.0-44.9, adult  . Back pain  . OSA (obstructive sleep apnea)  . Cervical disc disease  . 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 Need for Tdap vaccination  Tdap vaccine greater than or equal to 7yo IM   2.  DM 3.  HL 4.  HTN 5.  Obesity  His glucose intolerance is clearly related to his weight and his carbohydrate intake [facility] is [facility] training 2-3 times a week with weight loss goal of 30-50 pounds/hemoglobin A1c deferred today because it would be abnormal Other medications continued Lab in 3 months

## 2012-09-17 ENCOUNTER — Other Ambulatory Visit: Payer: Self-pay | Admitting: Physician Assistant

## 2012-10-08 ENCOUNTER — Ambulatory Visit (INDEPENDENT_AMBULATORY_CARE_PROVIDER_SITE_OTHER): Payer: BC Managed Care – PPO | Admitting: Internal Medicine

## 2012-10-08 ENCOUNTER — Encounter: Payer: Self-pay | Admitting: Internal Medicine

## 2012-10-08 VITALS — BP 140/88 | HR 69 | Temp 97.8°F | Resp 18 | Ht 71.5 in | Wt 301.8 lb

## 2012-10-08 DIAGNOSIS — E785 Hyperlipidemia, unspecified: Secondary | ICD-10-CM

## 2012-10-08 DIAGNOSIS — Z6841 Body Mass Index (BMI) 40.0 and over, adult: Secondary | ICD-10-CM

## 2012-10-08 DIAGNOSIS — M25552 Pain in left hip: Secondary | ICD-10-CM

## 2012-10-08 DIAGNOSIS — E119 Type 2 diabetes mellitus without complications: Secondary | ICD-10-CM

## 2012-10-08 DIAGNOSIS — J309 Allergic rhinitis, unspecified: Secondary | ICD-10-CM

## 2012-10-08 DIAGNOSIS — Z23 Encounter for immunization: Secondary | ICD-10-CM

## 2012-10-08 DIAGNOSIS — I10 Essential (primary) hypertension: Secondary | ICD-10-CM

## 2012-10-08 DIAGNOSIS — M25559 Pain in unspecified hip: Secondary | ICD-10-CM

## 2012-10-08 LAB — COMPREHENSIVE METABOLIC PANEL
ALT: 40 U/L (ref 0–53)
AST: 36 U/L (ref 0–37)
Albumin: 4.5 g/dL (ref 3.5–5.2)
Alkaline Phosphatase: 53 U/L (ref 39–117)
BUN: 16 mg/dL (ref 6–23)
CO2: 25 mEq/L (ref 19–32)
Calcium: 8.8 mg/dL (ref 8.4–10.5)
Chloride: 97 mEq/L (ref 96–112)
Creat: 1.11 mg/dL (ref 0.50–1.35)
Glucose, Bld: 247 mg/dL — ABNORMAL HIGH (ref 70–99)
Potassium: 3.6 mEq/L (ref 3.5–5.3)
Sodium: 136 mEq/L (ref 135–145)
Total Bilirubin: 0.7 mg/dL (ref 0.3–1.2)
Total Protein: 6.7 g/dL (ref 6.0–8.3)

## 2012-10-08 LAB — CBC WITH DIFFERENTIAL/PLATELET
Basophils Absolute: 0 10*3/uL (ref 0.0–0.1)
Basophils Relative: 1 % (ref 0–1)
Eosinophils Absolute: 0.1 10*3/uL (ref 0.0–0.7)
Eosinophils Relative: 2 % (ref 0–5)
HCT: 45.3 % (ref 39.0–52.0)
Hemoglobin: 16.1 g/dL (ref 13.0–17.0)
Lymphocytes Relative: 40 % (ref 12–46)
Lymphs Abs: 1.6 10*3/uL (ref 0.7–4.0)
MCH: 31.9 pg (ref 26.0–34.0)
MCHC: 35.5 g/dL (ref 30.0–36.0)
MCV: 89.7 fL (ref 78.0–100.0)
Monocytes Absolute: 0.3 10*3/uL (ref 0.1–1.0)
Monocytes Relative: 9 % (ref 3–12)
Neutro Abs: 1.9 10*3/uL (ref 1.7–7.7)
Neutrophils Relative %: 48 % (ref 43–77)
Platelets: 166 10*3/uL (ref 150–400)
RBC: 5.05 MIL/uL (ref 4.22–5.81)
RDW: 13.6 % (ref 11.5–15.5)
WBC: 3.9 10*3/uL — ABNORMAL LOW (ref 4.0–10.5)

## 2012-10-08 LAB — LIPID PANEL
Cholesterol: 199 mg/dL (ref 0–200)
HDL: 39 mg/dL — ABNORMAL LOW (ref >39)
LDL Cholesterol: 99 mg/dL (ref 0–99)
Total CHOL/HDL Ratio: 5.1 Ratio
Triglycerides: 306 mg/dL — ABNORMAL HIGH (ref <150)
VLDL: 61 mg/dL — ABNORMAL HIGH (ref 0–40)

## 2012-10-08 LAB — POCT GLYCOSYLATED HEMOGLOBIN (HGB A1C): Hemoglobin A1C: 9.5

## 2012-10-08 MED ORDER — FLUTICASONE PROPIONATE 50 MCG/ACT NA SUSP: 2.0000 | Freq: Every day | NASAL | Status: DC

## 2012-10-08 MED ORDER — METOPROLOL TARTRATE 100 MG PO TABS: 100.0000 mg | ORAL_TABLET | Freq: Every day | ORAL | Status: DC

## 2012-10-08 MED ORDER — AMLODIPINE BESYLATE 5 MG PO TABS: 5.0000 mg | ORAL_TABLET | Freq: Every day | ORAL | Status: DC

## 2012-10-08 MED ORDER — SIMVASTATIN 20 MG PO TABS: ORAL_TABLET | ORAL | Status: DC

## 2012-10-08 MED ORDER — LISINOPRIL-HYDROCHLOROTHIAZIDE 20-12.5 MG PO TABS: 2.0000 | ORAL_TABLET | Freq: Every day | ORAL | Status: DC

## 2012-10-08 NOTE — Progress Notes (Signed)
Subjective:    Patient ID: [name], male    DOB: [birth year], 57 y.o.   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 This improves with walking  Also complaining of stiffness and pain in his right shoulder/overhead motion limited by pain at times but this too comes and goes and this week has not been a problem  Noting that allergy season has increased his symptoms over the last 30 days and needs a refill for Flonase to accompany his use of loratadine   his cervical radiculopathy to the left has been active twice in the last 6 months but lasting only for one week at a time/he has no persistent symptoms  Needs Pneumovax #1  Review of Systems Certainly no weight loss    Denies sleep problems No chest pain palpitation No shortness of breath No GI disturbances Stress from taking care of  mother-in-law  Objective:   Physical Exam BP 140/88  Pulse 69  Temp(Src) 97.8 F (36.6 C) (Oral)  Resp 18  Ht 5' 11.5" (1.816 m)  Wt 301 lb 12.8 oz (136.896 kg)  BMI 41.51 kg/m2  SpO2 96% HEENT clear Neck range of motion is fair No neural losses in the upper extremities Heart regular without murmur/no carotid bruits Right shoulder has good range of motion without pain Left hip has adequate range of motion without pain except mild tenderness over  the greater trochanter without swelling Sensation intact on the lower extremities     Results for orders placed in visit on 10/08/12  POCT GLYCOSYLATED HEMOGLOBIN (HGB A1C)      Result Value Range   Hemoglobin A1C 9.5      Assessment & Plan:  Problem #1 uncontrolled diabetes Problem #2 hypertension Problem #3 hyperlipidemia Problem #4 hip bursitis Problem #5 osteoarthritis shoulder Problem #6 cervical radiculopathy Problem #7 morbid obesity  Physical therapy is a must to start exercise program Serious weight loss is a must if he wants to continue flying Decrease in alcohol is a must if he is to lose weight Discussed victosa  Meds ordered this encounter  Medications  . amLODipine (NORVASC) 5 MG tablet    Sig: Take 1 tablet (5 mg total) by mouth daily. Needs office visit    Dispense:  180 tablet    Refill:  0  . lisinopril-hydrochlorothiazide (PRINZIDE,ZESTORETIC) 20-12.5 MG per tablet    Sig: Take 2 tablets by mouth daily.    Dispense:  360 tablet    Refill:  1  . metoprolol (LOPRESSOR) 100 MG tablet    Sig: Take 1 tablet (100 mg total) by mouth daily.    Dispense:  180 tablet    Refill:  0  . simvastatin (ZOCOR) 20 MG tablet    Sig: TAKE 1 TABLET BY MOUTH EVERY EVENING.    Dispense:  180 tablet    Refill:  0  .  fluticasone (FLONASE) 50 MCG/ACT nasal spray    Sig: Place 2 sprays into the nose daily.    Dispense:  16 g    Refill:  11   If hemoglobin A1c not stabilized by weight loss in 3 months we will start injectables/?actos  ov

## 2012-10-14 ENCOUNTER — Encounter: Payer: Self-pay | Admitting: Internal Medicine

## 2012-12-05 ENCOUNTER — Telehealth: Payer: Self-pay | Admitting: *Deleted

## 2012-12-05 NOTE — Telephone Encounter (Signed)
Called left message in voice-mail of cell number with appointment change 12-12-12 at 1:00 p.m.

## 2012-12-10 ENCOUNTER — Ambulatory Visit: Payer: BC Managed Care – PPO | Admitting: Internal Medicine

## 2012-12-12 ENCOUNTER — Encounter: Payer: Self-pay | Admitting: Internal Medicine

## 2012-12-12 ENCOUNTER — Ambulatory Visit (INDEPENDENT_AMBULATORY_CARE_PROVIDER_SITE_OTHER): Payer: BC Managed Care – PPO | Admitting: Internal Medicine

## 2012-12-12 VITALS — BP 126/84 | HR 66 | Temp 98.1°F | Resp 16 | Ht 72.0 in | Wt 301.0 lb

## 2012-12-12 DIAGNOSIS — G4733 Obstructive sleep apnea (adult) (pediatric): Secondary | ICD-10-CM

## 2012-12-12 DIAGNOSIS — E785 Hyperlipidemia, unspecified: Secondary | ICD-10-CM

## 2012-12-12 DIAGNOSIS — E119 Type 2 diabetes mellitus without complications: Secondary | ICD-10-CM

## 2012-12-12 DIAGNOSIS — I10 Essential (primary) hypertension: Secondary | ICD-10-CM

## 2012-12-12 DIAGNOSIS — Z6841 Body Mass Index (BMI) 40.0 and over, adult: Secondary | ICD-10-CM

## 2012-12-12 DIAGNOSIS — L989 Disorder of the skin and subcutaneous tissue, unspecified: Secondary | ICD-10-CM

## 2012-12-12 LAB — COMPREHENSIVE METABOLIC PANEL
ALT: 39 U/L (ref 0–53)
AST: 37 U/L (ref 0–37)
Albumin: 4.2 g/dL (ref 3.5–5.2)
Alkaline Phosphatase: 45 U/L (ref 39–117)
BUN: 16 mg/dL (ref 6–23)
CO2: 29 mEq/L (ref 19–32)
Calcium: 8.9 mg/dL (ref 8.4–10.5)
Chloride: 97 mEq/L (ref 96–112)
Creat: 1.15 mg/dL (ref 0.50–1.35)
Glucose, Bld: 143 mg/dL — ABNORMAL HIGH (ref 70–99)
Potassium: 3.3 mEq/L — ABNORMAL LOW (ref 3.5–5.3)
Sodium: 137 mEq/L (ref 135–145)
Total Bilirubin: 0.7 mg/dL (ref 0.3–1.2)
Total Protein: 6.4 g/dL (ref 6.0–8.3)

## 2012-12-12 LAB — LIPID PANEL
Cholesterol: 178 mg/dL (ref 0–200)
HDL: 39 mg/dL — ABNORMAL LOW (ref >39)
LDL Cholesterol: 98 mg/dL (ref 0–99)
Total CHOL/HDL Ratio: 4.6 Ratio
Triglycerides: 204 mg/dL — ABNORMAL HIGH (ref <150)
VLDL: 41 mg/dL — ABNORMAL HIGH (ref 0–40)

## 2012-12-12 LAB — POCT GLYCOSYLATED HEMOGLOBIN (HGB A1C): Hemoglobin A1C: 8.3

## 2012-12-12 NOTE — Progress Notes (Signed)
Subjective:    Patient ID: [name], male    DOB: [birth year], 57 y.o.   MRN: [id number]  HPIf/u  Patient Active Problem List   Diagnosis Date Noted  . Type 2 diabetes mellitus 08/29/2011    Priority: High  . BMI 40.0-44.9, adult 08/29/2011    Priority: High  . Back pain 12/26/2011    Priority: Medium  . OSA (obstructive sleep apnea) 12/26/2011    Priority: Medium  . Cervical disc disease 12/26/2011    Priority: Medium  . Hypertension 08/29/2011    Priority: Medium  . Hyperlipidemia 08/29/2011    Priority: Medium  . Hearing loss-aides 03/26/2012    aodm-uncontr///so trying to regain control with protein diet but without any antidiabetic meds because of side effects////has been off medications for over a year All bone complaints better ?why---only chg is high prot diet Using stevia C pap compliance 99%  Skin area left malar that remains irritated   Review of Systems No new fatigue No change in appetite or activity No fever or night sweats No Chest pain or palpitations No dyspnea on exertion   no peripheral edema Objective:   Physical Exam BP 126/84  Pulse 66  Temp(Src) 98.1 F (36.7 C) (Oral)  Resp 16  Ht 6' (1.829 m)  Wt 301 lb (136.533 kg)  BMI 40.81 kg/m2  SpO2 96% Mood good affect appropriate Heart regular No carotid bruits No peripheral edema/good peripheral pulses/no sensory loss in lower extremities Skin with mild changes in the left malar area that are skin colored,rough With some scaling and minimal scabbing       Results for orders placed in visit on 12/12/12  POCT GLYCOSYLATED HEMOGLOBIN (HGB A1C)      Result Value Range   Hemoglobin A1C 8.3      Assessment & Plan:  Type 2 diabetes mellitus -improving with diet alone  BMI 40.0-44.9, adult--- needs to focus on weight loss as before  Hypertension - Plan: Comprehensive metabolic panel  Hyperlipidemia - Plan: Lipid panel  OSA (obstructive sleep apnea)  Face lesion - Plan:  Ambulatory referral to Dermatology   Results for orders placed in visit on 12/12/12  LIPID PANEL      Result Value Range   Cholesterol 178  0 - 200 mg/dL   Triglycerides 782 (*) <150 mg/dL   HDL 39 (*) >95 mg/dL   Total CHOL/HDL Ratio 4.6     VLDL 41 (*) 0 - 40 mg/dL   LDL Cholesterol 98  0 - 99 mg/dL  COMPREHENSIVE METABOLIC PANEL      Result Value Range   Sodium 137  135 - 145 mEq/L   Potassium 3.3 (*) 3.5 - 5.3 mEq/L   Chloride 97  96 - 112 mEq/L   CO2 29  19 - 32 mEq/L   Glucose, Bld 143 (*) 70 - 99 mg/dL   BUN 16  6 - 23 mg/dL   Creat 6.21  3.08 - 6.57 mg/dL   Total Bilirubin 0.7  0.3 - 1.2 mg/dL   Alkaline Phosphatase 45  39 - 117 U/L   AST 37  0 - 37 U/L   ALT 39  0 - 53 U/L   Total Protein 6.4  6.0 - 8.3 g/dL   Albumin 4.2  3.5 - 5.2 g/dL   Calcium 8.9  8.4 - 84.6 mg/dL  POCT GLYCOSYLATED HEMOGLOBIN (HGB A1C)      Result Value Range   Hemoglobin A1C 8.3     Will communicate  with FAA re clearance

## 2012-12-14 ENCOUNTER — Encounter: Payer: Self-pay | Admitting: Internal Medicine

## 2013-01-07 ENCOUNTER — Ambulatory Visit: Payer: BC Managed Care – PPO | Admitting: Internal Medicine

## 2013-04-08 ENCOUNTER — Encounter: Payer: Self-pay | Admitting: Internal Medicine

## 2013-04-08 ENCOUNTER — Ambulatory Visit (INDEPENDENT_AMBULATORY_CARE_PROVIDER_SITE_OTHER): Payer: BC Managed Care – PPO | Admitting: Internal Medicine

## 2013-04-08 VITALS — BP 122/90 | HR 72 | Temp 97.3°F | Resp 16 | Ht 72.0 in | Wt 301.6 lb

## 2013-04-08 DIAGNOSIS — I10 Essential (primary) hypertension: Secondary | ICD-10-CM

## 2013-04-08 DIAGNOSIS — E785 Hyperlipidemia, unspecified: Secondary | ICD-10-CM

## 2013-04-08 DIAGNOSIS — E119 Type 2 diabetes mellitus without complications: Secondary | ICD-10-CM

## 2013-04-08 DIAGNOSIS — G4733 Obstructive sleep apnea (adult) (pediatric): Secondary | ICD-10-CM

## 2013-04-08 DIAGNOSIS — Z6841 Body Mass Index (BMI) 40.0 and over, adult: Secondary | ICD-10-CM

## 2013-04-08 LAB — CBC WITH DIFFERENTIAL/PLATELET
Basophils Absolute: 0 10*3/uL (ref 0.0–0.1)
Basophils Relative: 1 % (ref 0–1)
Eosinophils Absolute: 0.1 10*3/uL (ref 0.0–0.7)
Eosinophils Relative: 2 % (ref 0–5)
HCT: 44.7 % (ref 39.0–52.0)
Hemoglobin: 15.7 g/dL (ref 13.0–17.0)
Lymphocytes Relative: 41 % (ref 12–46)
Lymphs Abs: 1.4 10*3/uL (ref 0.7–4.0)
MCH: 31.6 pg (ref 26.0–34.0)
MCHC: 35.1 g/dL (ref 30.0–36.0)
MCV: 89.9 fL (ref 78.0–100.0)
Monocytes Absolute: 0.4 10*3/uL (ref 0.1–1.0)
Monocytes Relative: 11 % (ref 3–12)
Neutro Abs: 1.6 10*3/uL — ABNORMAL LOW (ref 1.7–7.7)
Neutrophils Relative %: 45 % (ref 43–77)
Platelets: 168 10*3/uL (ref 150–400)
RBC: 4.97 MIL/uL (ref 4.22–5.81)
RDW: 13.1 % (ref 11.5–15.5)
WBC: 3.4 10*3/uL — ABNORMAL LOW (ref 4.0–10.5)

## 2013-04-08 LAB — GLUCOSE, POCT (MANUAL RESULT ENTRY): POC Glucose: 230 mg/dl — AB (ref 70–99)

## 2013-04-08 LAB — POCT GLYCOSYLATED HEMOGLOBIN (HGB A1C): Hemoglobin A1C: 9.9

## 2013-04-08 MED ORDER — METFORMIN HCL ER 500 MG PO TB24: 500.0000 mg | ORAL_TABLET | Freq: Every day | ORAL | Status: DC

## 2013-04-08 MED ORDER — SIMVASTATIN 20 MG PO TABS: ORAL_TABLET | ORAL | Status: DC

## 2013-04-08 MED ORDER — AMLODIPINE BESYLATE 5 MG PO TABS: 5.0000 mg | ORAL_TABLET | Freq: Every day | ORAL | Status: DC

## 2013-04-08 MED ORDER — METOPROLOL TARTRATE 100 MG PO TABS
100.0000 mg | ORAL_TABLET | Freq: Every day | ORAL | Status: DC
Start: 2013-04-08 — End: 2013-09-24

## 2013-04-08 MED ORDER — LISINOPRIL-HYDROCHLOROTHIAZIDE 20-12.5 MG PO TABS: 2.0000 | ORAL_TABLET | Freq: Every day | ORAL | Status: DC

## 2013-04-08 MED ORDER — AZITHROMYCIN 250 MG PO TABS: ORAL_TABLET | ORAL | Status: DC

## 2013-04-08 NOTE — Progress Notes (Signed)
Subjective:    Patient ID: [name], male    DOB: 10-14-1955, 57 y.o.   MRN: [id number]  HPIf/u Patient Active Problem List   Diagnosis Date Noted  . Type 2 diabetes mellitus has been only fair with diet/still 4g red/d,little exer/no wt loss 08/29/2011    Priority: High  . BMI 40.0-44.9, adult---has not approached this with rigor 08/29/2011    Priority: High  . Back pain--occas problems but no need for pain meds 12/26/2011    Priority: Medium  . OSA (obstructive sleep apnea)--stable cpap 12/26/2011    Priority: Medium  . Cervical disc disease 12/26/2011    Priority: Medium  . Hypertension---home BPs up when stressed over business-recent downturn 08/29/2011    Priority: Medium  . Hyperlipidemia--no side eff 08/29/2011    Priority: Medium  . Hearing loss-aides 03/26/2012   Had back/flank pain on metf so stopped 2-3 yr ago p 2 days--? [employer] assoc   Day trading w/ algorithms new endeavor  Review of Systems  Constitutional: Negative for fatigue and unexpected weight change.  Eyes: Negative for visual disturbance.  Respiratory: Negative for shortness of breath.   Cardiovascular: Negative for chest pain, palpitations and leg swelling.  Gastrointestinal: Negative for abdominal distention.  Genitourinary: Negative for difficulty urinating.  Neurological: Negative for headaches.       Recently with paresthesias both feet--fleeting  Psychiatric/Behavioral: Negative for dysphoric mood.       Objective:   Physical Exam  Constitutional: He is oriented to person, place, and time. He appears well-nourished. No distress.  Eyes: Conjunctivae and EOM are normal. Pupils are equal, round, and reactive to light.  Cardiovascular: Normal rate, regular rhythm and intact distal pulses.   Pulmonary/Chest: Effort normal.  Musculoskeletal: He exhibits no edema.  Neurological: He is alert and oriented to person, place, and time. He has normal reflexes.  Psychiatric: He has a normal mood and  affect. His behavior is normal.  BP 122/90  Pulse 72  Temp(Src) 97.3 F (36.3 C) (Oral)  Resp 16  Ht 6' (1.829 m)  Wt 301 lb 9.6 oz (136.805 kg)  BMI 40.9 kg/m2  SpO2 97%   Results for orders placed in visit on 04/08/13  CBC WITH DIFFERENTIAL      Result Value Range   WBC 3.4 (*) 4.0 - 10.5 K/uL   RBC 4.97  4.22 - 5.81 MIL/uL   Hemoglobin 15.7  13.0 - 17.0 g/dL   HCT 40.9  81.1 - 91.4 %   MCV 89.9  78.0 - 100.0 fL   MCH 31.6  26.0 - 34.0 pg   MCHC 35.1  30.0 - 36.0 g/dL   RDW 78.2  95.6 - 21.3 %   Platelets 168  150 - 400 K/uL   Neutrophils Relative % 45  43 - 77 %   Neutro Abs 1.6 (*) 1.7 - 7.7 K/uL   Lymphocytes Relative 41  12 - 46 %   Lymphs Abs 1.4  0.7 - 4.0 K/uL   Monocytes Relative 11  3 - 12 %   Monocytes Absolute 0.4  0.1 - 1.0 K/uL   Eosinophils Relative 2  0 - 5 %   Eosinophils Absolute 0.1  0.0 - 0.7 K/uL   Basophils Relative 1  0 - 1 %   Basophils Absolute 0.0  0.0 - 0.1 K/uL   Smear Review Criteria for review not met    COMPREHENSIVE METABOLIC PANEL      Result Value Range   Sodium  135 - 145 mEq/L   Potassium    3.5 - 5.3 mEq/L   Chloride    96 - 112 mEq/L   CO2    19 - 32 mEq/L   Glucose, Bld    70 - 99 mg/dL   BUN    6 - 23 mg/dL   Creat    1.61 - 0.96 mg/dL   Total Bilirubin    0.3 - 1.2 mg/dL   Alkaline Phosphatase    39 - 117 U/L   AST    0 - 37 U/L   ALT    0 - 53 U/L   Total Protein    6.0 - 8.3 g/dL   Albumin    3.5 - 5.2 g/dL   Calcium    8.4 - 04.5 mg/dL  LIPID PANEL      Result Value Range   Cholesterol    0 - 200 mg/dL   Triglycerides    <409 mg/dL   HDL    >81 mg/dL   Total CHOL/HDL Ratio       VLDL    0 - 40 mg/dL   LDL Cholesterol    0 - 99 mg/dL  GLUCOSE, POCT (MANUAL RESULT ENTRY)      Result Value Range   POC Glucose 230 (*) 70 - 99 mg/dl  POCT GLYCOSYLATED HEMOGLOBIN (HGB A1C)      Result Value Range   Hemoglobin A1C 9.9           Assessment & Plan:  Type II or unspecified type diabetes mellitus without  mention of complication, not stated as uncontrolled but now uncontrolled -  Essential hypertension, benign - Plan: CBC with Differential, Comprehensive metabolic panel, metoprolol (LOPRESSOR) 100 MG tablet, lisinopril-hydrochlorothiazide (PRINZIDE,ZESTORETIC) 20-12.5 MG per tablet, amLODipine (NORVASC) 5 MG tablet  Other and unspecified hyperlipidemia - Plan: Lipid panel, simvastatin (ZOCOR) 20 MG tablet  BMI 40.0-44.9, adult  Type 2 diabetes mellitus  Hyperlipidemia  Hypertension  OSA (obstructive sleep apnea)  Long discussion about serious weight loss,reduction of calorie esp wine intake, exercise  Start trial low dose metf and reeval 1 month  Meds ordered this encounter  Medications  . metFORMIN (GLUCOPHAGE-XR) 500 MG 24 hr tablet    Sig: Take 1 tablet (500 mg total) by mouth daily with breakfast.    Dispense:  90 tablet    Refill:  1  . metoprolol (LOPRESSOR) 100 MG tablet    Sig: Take 1 tablet (100 mg total) by mouth daily.    Dispense:  180 tablet    Refill:  0  . simvastatin (ZOCOR) 20 MG tablet    Sig: TAKE 1 TABLET BY MOUTH EVERY EVENING.    Dispense:  180 tablet    Refill:  0  . lisinopril-hydrochlorothiazide (PRINZIDE,ZESTORETIC) 20-12.5 MG per tablet    Sig: Take 2 tablets by mouth daily.    Dispense:  360 tablet    Refill:  0  . amLODipine (NORVASC) 5 MG tablet    Sig: Take 1 tablet (5 mg total) by mouth daily.    Dispense:  180 tablet    Refill:  0  . azithromycin (ZITHROMAX) 250 MG tablet    Sig: As packaged    Dispense:  6 tablet    Refill:  0

## 2013-04-09 LAB — COMPREHENSIVE METABOLIC PANEL
ALT: 51 U/L (ref 0–53)
AST: 45 U/L — ABNORMAL HIGH (ref 0–37)
Albumin: 4.4 g/dL (ref 3.5–5.2)
Alkaline Phosphatase: 47 U/L (ref 39–117)
BUN: 14 mg/dL (ref 6–23)
CO2: 28 mEq/L (ref 19–32)
Calcium: 8.7 mg/dL (ref 8.4–10.5)
Chloride: 96 mEq/L (ref 96–112)
Creat: 0.99 mg/dL (ref 0.50–1.35)
Glucose, Bld: 229 mg/dL — ABNORMAL HIGH (ref 70–99)
Potassium: 3.4 mEq/L — ABNORMAL LOW (ref 3.5–5.3)
Sodium: 134 mEq/L — ABNORMAL LOW (ref 135–145)
Total Bilirubin: 0.8 mg/dL (ref 0.3–1.2)
Total Protein: 6.7 g/dL (ref 6.0–8.3)

## 2013-04-09 LAB — LIPID PANEL
Cholesterol: 187 mg/dL (ref 0–200)
HDL: 39 mg/dL — ABNORMAL LOW (ref >39)
LDL Cholesterol: 87 mg/dL (ref 0–99)
Total CHOL/HDL Ratio: 4.8 Ratio
Triglycerides: 306 mg/dL — ABNORMAL HIGH (ref <150)
VLDL: 61 mg/dL — ABNORMAL HIGH (ref 0–40)

## 2013-04-18 ENCOUNTER — Encounter: Payer: Self-pay | Admitting: Internal Medicine

## 2013-05-13 ENCOUNTER — Ambulatory Visit (INDEPENDENT_AMBULATORY_CARE_PROVIDER_SITE_OTHER): Payer: BC Managed Care – PPO | Admitting: Internal Medicine

## 2013-05-13 VITALS — BP 146/86 | HR 71 | Temp 97.4°F | Resp 16 | Ht 72.0 in | Wt 302.0 lb

## 2013-05-13 DIAGNOSIS — I10 Essential (primary) hypertension: Secondary | ICD-10-CM

## 2013-05-13 DIAGNOSIS — IMO0001 Reserved for inherently not codable concepts without codable children: Secondary | ICD-10-CM

## 2013-05-13 DIAGNOSIS — E119 Type 2 diabetes mellitus without complications: Secondary | ICD-10-CM

## 2013-05-13 DIAGNOSIS — Z6841 Body Mass Index (BMI) 40.0 and over, adult: Secondary | ICD-10-CM

## 2013-05-13 LAB — POCT GLYCOSYLATED HEMOGLOBIN (HGB A1C): Hemoglobin A1C: 9.4

## 2013-05-13 LAB — COMPREHENSIVE METABOLIC PANEL
ALT: 37 U/L (ref 0–53)
AST: 40 U/L — ABNORMAL HIGH (ref 0–37)
Albumin: 4.6 g/dL (ref 3.5–5.2)
Alkaline Phosphatase: 49 U/L (ref 39–117)
BUN: 19 mg/dL (ref 6–23)
CO2: 30 mEq/L (ref 19–32)
Calcium: 9.1 mg/dL (ref 8.4–10.5)
Chloride: 96 mEq/L (ref 96–112)
Creat: 1.1 mg/dL (ref 0.50–1.35)
Glucose, Bld: 228 mg/dL — ABNORMAL HIGH (ref 70–99)
Potassium: 3.7 mEq/L (ref 3.5–5.3)
Sodium: 135 mEq/L (ref 135–145)
Total Bilirubin: 0.7 mg/dL (ref 0.3–1.2)
Total Protein: 7 g/dL (ref 6.0–8.3)

## 2013-05-13 MED ORDER — METFORMIN HCL ER 500 MG PO TB24: 1000.0000 mg | ORAL_TABLET | Freq: Every day | ORAL | Status: DC

## 2013-05-13 MED ORDER — AZITHROMYCIN 250 MG PO TABS: ORAL_TABLET | ORAL | Status: DC

## 2013-05-13 NOTE — Progress Notes (Signed)
Subjective:    Patient ID: [name], male    DOB: 27-Jun-1955, 57 y.o.   MRN: [id number]  HPI here for f/u Has restarted meds due to elev A1C metf 500er qd  Patient Active Problem List   Diagnosis Date Noted  . Type 2 diabetes mellitus 08/29/2011    Priority: High  . BMI 40.0-44.9, adult 08/29/2011    Priority: High  . Back pain 12/26/2011    Priority: Medium  . OSA (obstructive sleep apnea) 12/26/2011    Priority: Medium  . Cervical disc disease 12/26/2011    Priority: Medium  . Hypertension 08/29/2011    Priority: Medium  . Hyperlipidemia 08/29/2011    Priority: Medium  . Hearing loss-aides 03/26/2012   Metformin restarted 3 weeks ago-diarrhea 2 weeks then constipated last few days Home bs 100-300s dep on diet No contr diet 1 bottle red wine daily 4+ stress w/ work encourages lack of exer and incr etoh No reg exer  Current outpatient prescriptions:amLODipine (NORVASC) 5 MG tablet, Take 1 tablet (5 mg total) by mouth daily., Disp: 180 tablet, Rfl: 0;  aspirin 81 MG tablet,   Lactobacillus (DIGESTIVE HEALTH PROBIOTIC PO), Take 1 capsule by mouth 1 day or 1 dose  lisinopril-hydrochlorothiazide (PRINZIDE,ZESTORETIC) 20-12.5 MG per tablet, Take 2 tablets by mouth daily., Disp: 360 tablet, Rfl: 0 loratadine (CLARITIN) 10 MG tablet, Take 10 mg by mouth daily  magnesium 30 MG tablet, Take 30 mg by mouth 2 (two) times daily.,  metFORMIN (GLUCOPHAGE-XR) 500 MG 24 hr tablet, Take 1 tablet (500 mg total) by mouth daily with breakfast.  metoprolol (LOPRESSOR) 100 MG tablet, Take 1 tablet (100 mg total) by mouth daily., Disp: 180 tablet, Rfl: 0 Multiple Vitamin (MULTIVITAMIN) tablet, Take 1 tablet by mouth daily. simvastatin (ZOCOR) 20 MG tablet, TAKE 1 TABLET BY MOUTH EVERY EVENING.   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  HENT: Positive for postnasal drip and sinus pressure.        Continues w/ recurrences of sinusitis related to travel/allergies/URIs--resp well to zpak   Eyes: Negative for visual disturbance.  Respiratory: Negative for shortness of breath.   Cardiovascular: Negative for chest pain, palpitations and leg swelling.  Neurological: Negative for headaches.  Psychiatric/Behavioral: Positive for sleep disturbance. Negative for behavioral problems and dysphoric mood.       Cpap       Objective:   Physical Exam  Nursing note and vitals reviewed. Constitutional: He is oriented to person, place, and time. He appears well-developed and well-nourished. No distress.  HENT:  Head: Normocephalic and atraumatic.  Eyes: Pupils are equal, round, and reactive to light.  Neck: Normal range of motion.  Cardiovascular: Normal rate and regular rhythm.   Pulmonary/Chest: Effort normal. No respiratory distress.  Musculoskeletal: Normal range of motion.  Neurological: He is alert and oriented to person, place, and time.  Skin: Skin is warm and dry.  Psychiatric: He has a normal mood and affect. His behavior is normal.          Results for orders placed in visit on 05/13/13  POCT GLYCOSYLATED HEMOGLOBIN (HGB A1C)      Result Value Range   Hemoglobin A1C 9.4  Assessment & Plan:  Type II or unspecified type diabetes mellitus without mention of complication, uncontrolled  Sl better  incr meds to 1000 qd//then 1500-2000 as side effects stablize if needed  Focus is still on wt loss/less etoh   HTN (hypertension) - Plan: Comprehensive metabolic panel  BMI 40.0-44.9, adult  Sinusitis-recurrent   Meds ordered this encounter  Medications  . metFORMIN (GLUCOPHAGE-XR) 500 MG 24 hr tablet    Sig: Take 2 tablets (1,000 mg total) by mouth daily with breakfast.     Dispense:  180 tablet    Refill:  2   F/u 1-2 mos

## 2013-05-14 ENCOUNTER — Encounter: Payer: Self-pay | Admitting: Internal Medicine

## 2013-06-08 ENCOUNTER — Ambulatory Visit (INDEPENDENT_AMBULATORY_CARE_PROVIDER_SITE_OTHER): Payer: BC Managed Care – PPO | Admitting: Internal Medicine

## 2013-06-08 VITALS — BP 144/90 | HR 71 | Temp 98.0°F | Resp 16 | Ht 71.5 in | Wt 302.6 lb

## 2013-06-08 DIAGNOSIS — E119 Type 2 diabetes mellitus without complications: Secondary | ICD-10-CM

## 2013-06-08 DIAGNOSIS — Z6841 Body Mass Index (BMI) 40.0 and over, adult: Secondary | ICD-10-CM

## 2013-06-08 LAB — POCT GLYCOSYLATED HEMOGLOBIN (HGB A1C): Hemoglobin A1C: 9.2

## 2013-06-08 MED ORDER — AZITHROMYCIN 250 MG PO TABS: ORAL_TABLET | ORAL | Status: DC

## 2013-06-08 NOTE — Progress Notes (Signed)
   Subjective:    Patient ID: [name], male    DOB: December 08, 1955, 57 y.o.   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 2 pound weight loss in 3 weeks Diet has improved but only marginally Exercise has not changed and is still below what is required He has been able to restart metformin and has advance to 1000 mg in the morning without the GI reactions he had before  He has had increased congestion with nasal discharge over the last 2 days without any signs of purulence or relapse of his sinusitis  Review of Systems No changes    Objective:   Physical Exam BP 144/90  Pulse 71  Temp(Src) 98 F (36.7 C) (Oral)  Resp 16  Ht 5' 11.5" (1.816 m)  Wt 302 lb 9.6 oz (137.258 kg)  BMI 41.62 kg/m2  SpO2 97% Stable rest of exam Including ENT notable only for clear rhinorrhea  Results for orders placed in visit on 06/08/13  POCT GLYCOSYLATED HEMOGLOBIN (HGB A1C)      Result Value Range   Hemoglobin A1C 9.2         Assessment & Plan:  Type 2 diabetes mellitus - Plan: POCT glycosylated hemoglobin (Hb A1C)  BMI 40.0-44.9, adult  Viral URI  incr metf to 1000am and 500pm moniter BS and may go to 1000am/1000pm Ck A1C 6 weeks-12 weeks///apparently he can reapply for his license when his hemoglobin A1c is below 9 zpak(in the event of sinusitis on one of his any trips)

## 2013-06-12 ENCOUNTER — Encounter: Payer: Self-pay | Admitting: Internal Medicine

## 2013-07-15 ENCOUNTER — Ambulatory Visit (INDEPENDENT_AMBULATORY_CARE_PROVIDER_SITE_OTHER): Payer: BC Managed Care – PPO | Admitting: Internal Medicine

## 2013-07-15 VITALS — BP 125/83 | HR 72 | Temp 97.9°F | Resp 18 | Ht 71.0 in | Wt 301.0 lb

## 2013-07-15 DIAGNOSIS — E119 Type 2 diabetes mellitus without complications: Secondary | ICD-10-CM

## 2013-07-15 LAB — POCT GLYCOSYLATED HEMOGLOBIN (HGB A1C): Hemoglobin A1C: 10.5

## 2013-07-15 MED ORDER — METFORMIN HCL ER 500 MG PO TB24: 2000.0000 mg | ORAL_TABLET | Freq: Every day | ORAL | Status: DC

## 2013-07-17 NOTE — Progress Notes (Addendum)
Here for followup for diabetes Feels much better with decreased symptoms of neuropathy Diet better  Hemoglobin A1c is worse however Results for orders placed in visit on 07/15/13  POCT GLYCOSYLATED HEMOGLOBIN (HGB A1C)      Result Value Range   Hemoglobin A1C 10.5      Diabetes-uncontrolled - Plan: [facility] Diabetes Foot Exam, POCT glycosylated hemoglobin (Hb A1C)  Reemphasize the need for tighter control through diet and exercise. He is extremely reluctant to give up his wine intake. He has not been able to male that sustained exercise campaign. He hopes to avoid insulin or glipizide because he is trying to recertify his  [employer] license  Because of problems with GI intolerance with his evening meal dose of metformin we will change him to 2 g metformin 500 extended release daily at breakfast/this represents an increase in his total dose  Followup one to 2 months after following directions

## 2013-09-09 ENCOUNTER — Encounter: Payer: Self-pay | Admitting: Internal Medicine

## 2013-09-09 ENCOUNTER — Ambulatory Visit (INDEPENDENT_AMBULATORY_CARE_PROVIDER_SITE_OTHER): Payer: BC Managed Care – PPO | Admitting: Internal Medicine

## 2013-09-09 VITALS — BP 132/90 | HR 72 | Temp 98.4°F | Resp 16 | Ht 71.0 in | Wt 304.2 lb

## 2013-09-09 DIAGNOSIS — E119 Type 2 diabetes mellitus without complications: Secondary | ICD-10-CM

## 2013-09-09 DIAGNOSIS — L219 Seborrheic dermatitis, unspecified: Secondary | ICD-10-CM

## 2013-09-09 DIAGNOSIS — E785 Hyperlipidemia, unspecified: Secondary | ICD-10-CM

## 2013-09-09 DIAGNOSIS — Z6841 Body Mass Index (BMI) 40.0 and over, adult: Secondary | ICD-10-CM

## 2013-09-09 DIAGNOSIS — I10 Essential (primary) hypertension: Secondary | ICD-10-CM

## 2013-09-09 DIAGNOSIS — Z125 Encounter for screening for malignant neoplasm of prostate: Secondary | ICD-10-CM

## 2013-09-09 LAB — CBC WITH DIFFERENTIAL/PLATELET
Basophils Absolute: 0 10*3/uL (ref 0.0–0.1)
Basophils Relative: 0 % (ref 0–1)
Eosinophils Absolute: 0 10*3/uL (ref 0.0–0.7)
Eosinophils Relative: 1 % (ref 0–5)
HCT: 44 % (ref 39.0–52.0)
Hemoglobin: 15.7 g/dL (ref 13.0–17.0)
Lymphocytes Relative: 44 % (ref 12–46)
Lymphs Abs: 1.7 10*3/uL (ref 0.7–4.0)
MCH: 32.1 pg (ref 26.0–34.0)
MCHC: 35.7 g/dL (ref 30.0–36.0)
MCV: 90 fL (ref 78.0–100.0)
Monocytes Absolute: 0.3 10*3/uL (ref 0.1–1.0)
Monocytes Relative: 7 % (ref 3–12)
Neutro Abs: 1.8 10*3/uL (ref 1.7–7.7)
Neutrophils Relative %: 48 % (ref 43–77)
Platelets: 188 10*3/uL (ref 150–400)
RBC: 4.89 MIL/uL (ref 4.22–5.81)
RDW: 12.6 % (ref 11.5–15.5)
WBC: 3.8 10*3/uL — ABNORMAL LOW (ref 4.0–10.5)

## 2013-09-09 LAB — COMPREHENSIVE METABOLIC PANEL
ALT: 36 U/L (ref 0–53)
AST: 33 U/L (ref 0–37)
Albumin: 4.2 g/dL (ref 3.5–5.2)
Alkaline Phosphatase: 42 U/L (ref 39–117)
BUN: 19 mg/dL (ref 6–23)
CO2: 30 mEq/L (ref 19–32)
Calcium: 8.6 mg/dL (ref 8.4–10.5)
Chloride: 94 mEq/L — ABNORMAL LOW (ref 96–112)
Creat: 1.25 mg/dL (ref 0.50–1.35)
Glucose, Bld: 317 mg/dL — ABNORMAL HIGH (ref 70–99)
Potassium: 4.1 mEq/L (ref 3.5–5.3)
Sodium: 135 mEq/L (ref 135–145)
Total Bilirubin: 0.5 mg/dL (ref 0.2–1.2)
Total Protein: 6.2 g/dL (ref 6.0–8.3)

## 2013-09-09 LAB — POCT GLYCOSYLATED HEMOGLOBIN (HGB A1C): Hemoglobin A1C: 9.9

## 2013-09-09 LAB — LIPID PANEL
Cholesterol: 190 mg/dL (ref 0–200)
HDL: 34 mg/dL — ABNORMAL LOW (ref >39)
LDL Cholesterol: 82 mg/dL (ref 0–99)
Total CHOL/HDL Ratio: 5.6 Ratio
Triglycerides: 371 mg/dL — ABNORMAL HIGH (ref <150)
VLDL: 74 mg/dL — ABNORMAL HIGH (ref 0–40)

## 2013-09-09 NOTE — Progress Notes (Signed)
Subjective:    Patient ID: [name], male    DOB: July 08, 1955, 58 y.o.   MRN: [id number]  HPI here for followup, especially concerned about lack of control of diabetes Patient Active Problem List   Diagnosis Date Noted  . Type 2 diabetes mellitus-uncontrolled  08/29/2011    Priority: High  . BMI 40.0-44.9, adult 08/29/2011    Priority: High  . Back pain 12/26/2011    Priority: Medium  . OSA (obstructive sleep apnea) 12/26/2011    Priority: Medium  . Cervical disc disease 12/26/2011    Priority: Medium  . Hypertension 08/29/2011    Priority: Medium  . Hyperlipidemia 08/29/2011    Priority: Medium  . Hearing loss-aides 03/26/2012  Current outpatient prescriptions: amLODipine (NORVASC) 5 MG tablet, Take 1 tablet (5 mg total) by mouth daily. aspirin 81 MG tablet, Take 81 mg by mouth daily Lactobacillus (DIGESTIVE HEALTH PROBIOTIC PO), Take 1 capsule by mouth 1 day or 1 dose.,  lisinopril-hydrochlorothiazide (PRINZIDE,ZESTORETIC) 20-12.5 MG per tablet, Take 2 tablets by  mouth daily loratadine (CLARITIN) 10 MG tablet, Take 10 mg by mouth daily  magnesium 30 MG tablet, Take 30 mg by mouth daily. metFORMIN (GLUCOPHAGE-XR) 500 MG 24 hr tablet, Take 4 tablets (2,000 mg total) by mouth  daily with breakfast. metoprolol (LOPRESSOR) 100 MG tablet, Take 1 tablet (100 mg total) by mouth daily. Multiple Vitamin (MULTIVITAMIN) tablet, Take 1 tablet by mouth daily simvastatin (ZOCOR) 20 MG tablet, TAKE 1 TABLET BY MOUTH EVERY EVENING. 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  HENT: Negative for trouble swallowing.   Eyes: Negative for visual disturbance.  Respiratory: Negative for shortness of breath.   Cardiovascular: Negative for chest pain, palpitations and leg swelling.  Gastrointestinal: Negative for abdominal pain.  Genitourinary:       Joint problems have been stable  Neurological: Negative for weakness and headaches.  Psychiatric/Behavioral:       Using CPAP successfully       Objective:   Physical Exam  Constitutional: He appears well-developed and well-nourished.  Obesity  Eyes: EOM are normal. Pupils are equal, round, and reactive to light.  Neck: No thyromegaly present.  Cardiovascular: Normal rate, regular rhythm, normal heart sounds and intact distal pulses.   Musculoskeletal: He exhibits no edema.  Neurological:  Sensation intact plantar surface both feet  Skin:  There is a small warty growth at the upper nasal border of the left He has seborrheic dermatitis changes of botheyebrows and the nasal borders  Psychiatric: He has a normal mood and affect. His behavior is normal.   BP 132/90  Pulse 72  Temp(Src) 98.4 F (36.9 C) (Oral)  Resp 16  Ht '5\' 11"'  (1.803 m)  Wt 304  lb 3.2 oz (137.984 kg)  BMI 42.45 kg/m2  SpO2 96% Wt Readings from Last 3 Encounters:  09/09/13 304 lb 3.2 oz (137.984 kg)  07/15/13 301 lb (136.533 kg)  06/08/13 302 lb 9.6 oz (137.258 kg)     Hemoglobin A1c still above 9     Assessment & Plan:  Type II or unspecified type diabetes mellitus without mention of complication, not stated as  uncontrolled - Plan:  Ambulatory referral to Endocrinology-Dr Gherghe  BMI 40.0-44.9, adult - Plan: TSH  Hyperlipidemia - Plan: Lipid panel  Hypertension - Plan: CBC with Differential, Comprehensive metabolic panel  Screening for prostate cancer - Plan: PSA  Seborrheic dermatitis--continue head and shoulders/use baby shampoo for face and eyebrows  No change in medications for now   Addendum-labs Results for orders placed in visit on 09/09/13  CBC WITH DIFFERENTIAL      Result Value Ref Range   WBC 3.8 (*) 4.0 - 10.5 K/uL   RBC 4.89  4.22 - 5.81 MIL/uL   Hemoglobin 15.7  13.0 - 17.0 g/dL   HCT 44.0  39.0 - 52.0 %   MCV 90.0  78.0 - 100.0 fL   MCH 32.1  26.0 - 34.0 pg   MCHC 35.7  30.0 - 36.0 g/dL   RDW 12.6  11.5 - 15.5 %   Platelets 188  150 - 400 K/uL   Neutrophils Relative % 48  43 - 77 %   Neutro Abs 1.8  1.7 - 7.7 K/uL   Lymphocytes Relative 44  12 - 46 %   Lymphs Abs 1.7  0.7 - 4.0 K/uL   Monocytes Relative 7  3 - 12 %   Monocytes Absolute 0.3  0.1 - 1.0 K/uL   Eosinophils Relative 1  0 - 5 %   Eosinophils Absolute 0.0  0.0 - 0.7 K/uL   Basophils Relative 0  0 - 1 %   Basophils Absolute 0.0  0.0 - 0.1 K/uL   Smear Review Criteria for review not met    COMPREHENSIVE METABOLIC PANEL      Result Value Ref Range   Sodium 135  135 - 145 mEq/L   Potassium 4.1  3.5 - 5.3 mEq/L   Chloride 94 (*) 96 - 112 mEq/L   CO2 30  19 - 32 mEq/L   Glucose, Bld 317 (*) 70 - 99 mg/dL   BUN 19  6 - 23 mg/dL   Creat 1.25  0.50 - 1.35 mg/dL   Total Bilirubin 0.5  0.2 - 1.2 mg/dL   Alkaline Phosphatase 42  39 - 117 U/L   AST 33  0 - 37 U/L   ALT 36  0 - 53 U/L   Total Protein 6.2  6.0 - 8.3 g/dL   Albumin 4.2  3.5 - 5.2 g/dL   Calcium 8.6  8.4 - 10.5 mg/dL  LIPID PANEL      Result Value Ref Range   Cholesterol 190  0 - 200 mg/dL   Triglycerides 371 (*) <150 mg/dL   HDL 34 (*) >39 mg/dL   Total CHOL/HDL Ratio 5.6     VLDL 74 (*) 0 - 40 mg/dL   LDL Cholesterol 82  0 - 99 mg/dL    PSA      Result Value Ref Range   PSA    <=4.00 ng/mL  TSH      Result Value Ref Range   TSH    0.350 - 4.500 uIU/mL  POCT GLYCOSYLATED HEMOGLOBIN (HGB  A1C)      Result Value Ref Range   Hemoglobin A1C 9.9            Wife needs orthopedic suggestions for consult about knee:  Dr Marcelino Scot  Dr Pilar Plate [facility]  Dr Dara Hoyer

## 2013-09-09 NOTE — Patient Instructions (Signed)
Dr Marcelino Scot Dr Pilar Plate [facility] Dr Dara Hoyer

## 2013-09-10 LAB — TSH: TSH: 1.372 u[IU]/mL (ref 0.350–4.500)

## 2013-09-10 LAB — PSA: PSA: 1.36 ng/mL (ref <=4.00)

## 2013-09-14 ENCOUNTER — Encounter: Payer: Self-pay | Admitting: Internal Medicine

## 2013-09-23 ENCOUNTER — Encounter: Payer: Self-pay | Admitting: Internal Medicine

## 2013-09-23 DIAGNOSIS — E785 Hyperlipidemia, unspecified: Secondary | ICD-10-CM

## 2013-09-23 DIAGNOSIS — I10 Essential (primary) hypertension: Secondary | ICD-10-CM

## 2013-09-24 MED ORDER — LISINOPRIL-HYDROCHLOROTHIAZIDE 20-12.5 MG PO TABS: 2.0000 | ORAL_TABLET | Freq: Every day | ORAL | Status: DC

## 2013-09-24 MED ORDER — SIMVASTATIN 20 MG PO TABS: ORAL_TABLET | ORAL | Status: DC

## 2013-09-24 MED ORDER — METOPROLOL TARTRATE 100 MG PO TABS: 100.0000 mg | ORAL_TABLET | Freq: Every day | ORAL | Status: DC

## 2013-09-24 MED ORDER — METFORMIN HCL ER 500 MG PO TB24: 2000.0000 mg | ORAL_TABLET | Freq: Every day | ORAL | Status: DC

## 2013-09-24 MED ORDER — AMLODIPINE BESYLATE 5 MG PO TABS: 5.0000 mg | ORAL_TABLET | Freq: Every day | ORAL | Status: DC

## 2013-09-24 NOTE — Telephone Encounter (Signed)
Dr. Laney Pastor, I sent these in for him [facility]

## 2013-09-28 ENCOUNTER — Ambulatory Visit (INDEPENDENT_AMBULATORY_CARE_PROVIDER_SITE_OTHER): Payer: BC Managed Care – PPO | Admitting: Internal Medicine

## 2013-09-28 ENCOUNTER — Encounter: Payer: Self-pay | Admitting: Internal Medicine

## 2013-09-28 VITALS — BP 134/84 | HR 79 | Temp 98.3°F | Resp 12 | Ht 72.0 in | Wt 309.0 lb

## 2013-09-28 DIAGNOSIS — E119 Type 2 diabetes mellitus without complications: Secondary | ICD-10-CM

## 2013-09-28 NOTE — Patient Instructions (Addendum)
Continue Metformin XR 1000 mg 2x a day. Start under skin Victoza 0.6 mg daily in am for 1 week, then increase to 1.2 mg daily for 1 week, then increase to 1.8 mg daily. Start Invokana 100 mg daily in am.   Please return in 1 month with your sugar log.   PATIENT INSTRUCTIONS FOR TYPE 2 DIABETES:  **Please join MyChart!** - see attached instructions about how to join   DIET AND EXERCISE Diet and exercise is an important part of diabetic treatment.  We recommended aerobic exercise in the form of brisk walking (working between 40-60% of maximal aerobic capacity, similar to brisk walking) for 150 minutes per week (such as 30 minutes five days per week) along with 3 times per week performing 'resistance' training (using various gauge rubber tubes with handles) 5-10 exercises involving the major muscle groups (upper body, lower body and core) performing 10-15 repetitions (or near fatigue) each exercise. Start at half the above goal but build slowly to reach the above goals. If limited by weight, joint pain, or disability, we recommend daily walking in a swimming pool with water up to waist to reduce pressure from joints while allow for adequate exercise.    BLOOD GLUCOSES Monitoring your blood glucoses is important for continued management of your diabetes. Please check your blood glucoses 2-4 times a day: fasting, before meals and at bedtime (you can rotate these measurements - e.g. one day check before the 3 meals, the next day check before 2 of the meals and before bedtime, etc.   HYPOGLYCEMIA (low blood sugar) Hypoglycemia is usually a reaction to not eating, exercising, or taking too much insulin/ other diabetes drugs.  Symptoms include tremors, sweating, hunger, confusion, headache, etc. Treat IMMEDIATELY with 15 grams of Carbs:   4 glucose tablets    cup regular juice/soda   2 tablespoons raisins   4 teaspoons sugar   1 tablespoon honey Recheck blood glucose in 15 mins and repeat above if  still symptomatic/blood glucose <100. Please contact our office at [phone] if you have questions about how to next handle your insulin.  RECOMMENDATIONS TO REDUCE YOUR RISK OF DIABETIC COMPLICATIONS: * Take your prescribed MEDICATION(S). * Follow a DIABETIC diet: Complex carbs, fiber rich foods, heart healthy fish twice weekly, (monounsaturated and polyunsaturated) fats * AVOID saturated/trans fats, high fat foods, >2,300 mg salt per day. * EXERCISE at least 5 times a week for 30 minutes or preferably daily.  * DO NOT SMOKE OR DRINK more than 1 drink a day. * Check your FEET every day. Do not wear tightfitting shoes. Contact us if you develop an ulcer * See your EYE doctor once a year or more if needed * Get a FLU shot once a year * Get a PNEUMONIA vaccine once before and once after age 70 years  GOALS:  * Your Hemoglobin A1c of <7%  * fasting sugars need to be <130 * after meals sugars need to be <180 (2h after you start eating) * Your Systolic BP should be 098 or lower  * Your Diastolic BP should be 80 or lower  * Your HDL (Good Cholesterol) should be 40 or higher  * Your LDL (Bad Cholesterol) should be 100 or lower  * Your Triglycerides should be 150 or lower  * Your Urine microalbumin (kidney function) should be <30 * Your Body Mass Index should be 25 or lower   We will be glad to help you achieve these goals. Our telephone number is:  [phone].  Please consider the following ways to cut down carbs and fat and increase fiber and micronutrients in your diet:  - substitute whole grain for white bread or pasta - substitute brown rice for white rice - substitute 90-calorie flat bread pieces for slices of bread when possible - substitute sweet potatoes or yams for white potatoes - substitute humus for margarine - substitute tofu for cheese when possible - substitute almond or rice milk for regular milk (would not drink soy milk daily due to concern for soy estrogen  influence on breast cancer risk) - substitute dark chocolate for other sweets when possible - substitute water - can add lemon or orange slices for taste - for diet sodas (artificial sweeteners will trick your body that you can eat sweets without getting calories and will lead you to overeating and weight gain in the long run) - do not skip breakfast or other meals (this will slow down the metabolism and will result in more weight gain over time)  - can try smoothies made from fruit and almond/rice milk in am instead of regular breakfast - can also try old-fashioned (not instant) oatmeal made with almond/rice milk in am - order the dressing on the side when eating salad at a restaurant (pour less than half of the dressing on the salad) - eat as little meat as possible - can try juicing, but should not forget that juicing will get rid of the fiber, so would alternate with eating raw veg./fruits or drinking smoothies - use as little oil as possible, even when using olive oil - can dress a salad with a mix of balsamic vinegar and lemon juice, for e.g. - use agave nectar, stevia sugar, or regular sugar rather than artificial sweateners - steam or broil/roast veggies  - snack on veggies/fruit/nuts (unsalted, preferably) when possible, rather than processed foods - reduce or eliminate aspartame in diet (it is in diet sodas, chewing gum, etc) Read the labels!  Try to read Dr. Janene Harvey book: "Program for Reversing Diabetes" for the vegan concept and other ideas for healthy eating.

## 2013-09-28 NOTE — Progress Notes (Signed)
Patient ID: [name], male   DOB: 1956-03-01, 58 y.o.   MRN: [id number]  HPI: [name] is a 58 y.o.-year-old male, referred by his PCP, Dr.Doolittle, for management of DM2, non-insulin-dependent, uncontrolled, without complications.  Patient has been diagnosed with diabetes in 2000; he has not been on insulin before. Last hemoglobin A1c was: Lab Results  Component Value Date   HGBA1C 9.9 09/09/2013   HGBA1C 10.5 07/15/2013   HGBA1C 9.2 06/08/2013   Pt is on a regimen of: - Metformin XR 1000 mg po bid He tried Januvia >> did not "work" (not approved by [employer] - he is a Insurance underwriter) He tried Glipizide >> hypoglycemia , and also not approved by [employer].  Pt checks his sugars once every 3-4 days a day and they are: - am: 225-350  - 2h after b'fast: n/c - before lunch: 245 - 2h after lunch: n/c - before dinner: n/c - 2h after dinner: n/c - bedtime: 280-300 - nighttime: n/c No lows; he has hypoglycemia awareness at 70.  Highest sugar was 430 (after pancakes and syrup).  Pt's meals are very high calorie/high fat: - Breakfast: 2 fried eggs + bacon - Lunch: wrap or [place] sandwich or steak or BBQ + baked potato or mushrooms from [employer]; [employer], [place] (tuna with mayo) - Dinner: 3-5 glasses of wine!; chicken or fish + steamed veggies + olives; pizza - Snacks: no  No exercise.  - no CKD, last BUN/creatinine:  Lab Results  Component Value Date   BUN 19 09/09/2013   CREATININE 1.25 09/09/2013  On Lisinopril. - last set of lipids: Lab Results  Component Value Date   CHOL 190 09/09/2013   HDL 34* 09/09/2013   LDLCALC 82 09/09/2013   TRIG 371* 09/09/2013   CHOLHDL 5.6 09/09/2013  On Zocor. - last eye exam was in ~09/2012. No DR.  - no numbness and tingling in his feet. He has mild chane in sensation on his soles.   I reviewed his chart and he also has a history of OSA - compliant 100% with CPAP. He had a heart catheterization: 2-3 years ago >> normal.  Pt has no FH of  DM.  ROS: Constitutional: no weight gain/loss, + fatigue, no subjective hyperthermia/hypothermia, + poor sleep, + nocturia Eyes: no blurry vision, no xerophthalmia ENT: no sore throat, no nodules palpated in throat, no dysphagia/odynophagia, no hoarseness, + tinnitus Cardiovascular: no CP/SOB/palpitations/leg swelling Respiratory: no cough/SOB Gastrointestinal: no N/V/+ D/+ C Musculoskeletal: + both: muscle/joint aches: back pain, join pain Skin: no rashes Neurological: no tremors/numbness/tingling/dizziness, + HA Psychiatric: no depression/anxiety  Past Medical History  Diagnosis Date  . Diabetes mellitus   . Hypertension   . Rotator cuff tear   . Dyslipidemia   . Morbid obesity    Past Surgical History  Procedure Laterality Date  . Knee surgery    . Vasectomy     History   Social History  . Marital Status: Married    Spouse Name: N/A    Number of Children: 2   Occupational History  . 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 Smoking status: Former Smoker -- 2.00 packs/day for 18 years    Types: Cigarettes    Start date: 07/19/1973    Quit date: 12/13/1991  . Smokeless tobacco: Not on file  . Alcohol Use: Yes     RED WINE - 3-5 drinks a night  . Drug Use: No  Current Outpatient Prescriptions on File Prior to Visit  Medication Sig Dispense Refill  . amLODipine (NORVASC) 5 MG tablet Take 1 tablet (5 mg total) by mouth daily.  180 tablet  0  . aspirin 81 MG tablet Take 81 mg by mouth daily.      Marland Kitchen azithromycin (ZITHROMAX) 250 MG tablet As packaged  6 tablet  1  . Lactobacillus (DIGESTIVE HEALTH PROBIOTIC PO) Take 1 capsule by mouth 1 day or 1 dose.      Marland Kitchen lisinopril-hydrochlorothiazide (PRINZIDE,ZESTORETIC) 20-12.5 MG per tablet Take 2 tablets by mouth daily.  360 tablet  0  . loratadine (CLARITIN) 10 MG tablet Take 10 mg by mouth daily.      . magnesium 30 MG tablet Take 30 mg by mouth daily.       . metFORMIN  (GLUCOPHAGE-XR) 500 MG 24 hr tablet Take 4 tablets (2,000 mg total) by mouth daily with breakfast.  720 tablet  0  . metoprolol (LOPRESSOR) 100 MG tablet Take 1 tablet (100 mg total) by mouth daily.  180 tablet  0  . Multiple Vitamin (MULTIVITAMIN) tablet Take 1 tablet by mouth daily.      . simvastatin (ZOCOR) 20 MG tablet TAKE 1 TABLET BY MOUTH EVERY EVENING.  180 tablet  0  . vitamin C (ASCORBIC ACID) 500 MG tablet Take 1,000 mg by mouth daily.       No current facility-administered medications on file prior to visit.   No Known Allergies History reviewed. No pertinent family history.  PE: BP 134/84  Pulse 79  Temp(Src) 98.3 F (36.8 C) (Oral)  Resp 12  Ht 6' (1.829 m)  Wt 309 lb (140.161 kg)  BMI 41.90 kg/m2  SpO2 96% Wt Readings from Last 3 Encounters:  09/28/13 309 lb (140.161 kg)  09/09/13 304 lb 3.2 oz (137.984 kg)  07/15/13 301 lb (136.533 kg)   Constitutional: obese, in NAD Eyes: PERRLA, EOMI, no exophthalmos ENT: moist mucous membranes, no thyromegaly, no cervical lymphadenopathy Cardiovascular: RRR, No MRG Respiratory: CTA B Gastrointestinal: abdomen soft, NT, ND, BS+ Musculoskeletal: no deformities, strength intact in all 4 Skin: moist, warm, no rashes Neurological: no tremor with outstretched hands, DTR normal in all 4  ASSESSMENT: 1. DM2, non-insulin-dependent, uncontrolled, without complications  PLAN:  1. Patient with long-standing, recently more uncontrolled diabetes, on oral antidiabetic regimen, which became insufficient - We discussed about options for treatment, and I suggested to start insulin, but he refuses for now. In that case, we can try the following:  Patient Instructions  Continue Metformin XR 1000 mg 2x a day. Start under skin Victoza 0.6 mg daily in am for 1 week, then increase to 1.2 mg daily for 1 week, then increase to 1.8 mg daily. Start Invokana 100 mg daily in am.  Please return in 1 month with your sugar log.  - pt will let me  know if any of the above meds are not approved by [employer] >> if they are >> will send Rx's to pharmacy - we had a long discussion about diet - mostly how to cut down fat, also given written instructions >> see pt instructions.  - will also refer him to nutrition - Strongly advised him to start checking sugars at different times of the day - check 2 times a day, rotating checks - given sugar log and advised how to fill it and to bring it at next appt  - given foot care handout and explained the principles  - given instructions  for hypoglycemia management "15-15 rule"  - advised for yearly eye exams >> he is up to date - Return to clinic in 1 mo with sugar log   10/02/2013 I am still waiting for pt to contact me Re: his med orders.

## 2013-10-10 ENCOUNTER — Encounter: Payer: Self-pay | Admitting: Internal Medicine

## 2013-10-12 ENCOUNTER — Other Ambulatory Visit: Payer: Self-pay | Admitting: *Deleted

## 2013-10-12 MED ORDER — LIRAGLUTIDE 18 MG/3ML ~~LOC~~ SOPN: 1.8000 mg | PEN_INJECTOR | Freq: Every day | SUBCUTANEOUS | Status: DC

## 2013-10-19 ENCOUNTER — Other Ambulatory Visit: Payer: Self-pay | Admitting: Internal Medicine

## 2013-10-19 DIAGNOSIS — E119 Type 2 diabetes mellitus without complications: Secondary | ICD-10-CM

## 2013-10-19 MED ORDER — GLIPIZIDE ER 10 MG PO TB24: 10.0000 mg | ORAL_TABLET | Freq: Every day | ORAL | Status: DC

## 2013-10-21 ENCOUNTER — Ambulatory Visit (INDEPENDENT_AMBULATORY_CARE_PROVIDER_SITE_OTHER): Payer: BC Managed Care – PPO | Admitting: Internal Medicine

## 2013-10-21 ENCOUNTER — Encounter: Payer: Self-pay | Admitting: Internal Medicine

## 2013-10-21 VITALS — BP 120/70 | HR 74 | Temp 98.1°F | Resp 16 | Ht 71.0 in | Wt 304.4 lb

## 2013-10-21 DIAGNOSIS — E119 Type 2 diabetes mellitus without complications: Secondary | ICD-10-CM

## 2013-10-21 DIAGNOSIS — R110 Nausea: Secondary | ICD-10-CM

## 2013-10-21 DIAGNOSIS — E1165 Type 2 diabetes mellitus with hyperglycemia: Secondary | ICD-10-CM

## 2013-10-21 DIAGNOSIS — E785 Hyperlipidemia, unspecified: Secondary | ICD-10-CM

## 2013-10-21 DIAGNOSIS — Z6841 Body Mass Index (BMI) 40.0 and over, adult: Secondary | ICD-10-CM

## 2013-10-21 DIAGNOSIS — IMO0001 Reserved for inherently not codable concepts without codable children: Secondary | ICD-10-CM

## 2013-10-21 DIAGNOSIS — I10 Essential (primary) hypertension: Secondary | ICD-10-CM

## 2013-10-21 LAB — POCT GLYCOSYLATED HEMOGLOBIN (HGB A1C): Hemoglobin A1C: 9.5

## 2013-10-21 LAB — COMPLETE METABOLIC PANEL WITH GFR
ALT: 39 U/L (ref 0–53)
AST: 40 U/L — ABNORMAL HIGH (ref 0–37)
Albumin: 4.4 g/dL (ref 3.5–5.2)
Alkaline Phosphatase: 42 U/L (ref 39–117)
BUN: 20 mg/dL (ref 6–23)
CO2: 26 mEq/L (ref 19–32)
Calcium: 8.9 mg/dL (ref 8.4–10.5)
Chloride: 98 mEq/L (ref 96–112)
Creat: 1.3 mg/dL (ref 0.50–1.35)
GFR, Est African American: 70 mL/min
GFR, Est Non African American: 60 mL/min
Glucose, Bld: 122 mg/dL — ABNORMAL HIGH (ref 70–99)
Potassium: 3.7 mEq/L (ref 3.5–5.3)
Sodium: 138 mEq/L (ref 135–145)
Total Bilirubin: 0.4 mg/dL (ref 0.2–1.2)
Total Protein: 6.6 g/dL (ref 6.0–8.3)

## 2013-10-21 LAB — LIPID PANEL
Cholesterol: 189 mg/dL (ref 0–200)
HDL: 37 mg/dL — ABNORMAL LOW (ref >39)
Total CHOL/HDL Ratio: 5.1 Ratio
Triglycerides: 430 mg/dL — ABNORMAL HIGH (ref <150)

## 2013-10-21 LAB — CBC
HCT: 44 % (ref 39.0–52.0)
Hemoglobin: 15.6 g/dL (ref 13.0–17.0)
MCH: 32.4 pg (ref 26.0–34.0)
MCHC: 35.5 g/dL (ref 30.0–36.0)
MCV: 91.3 fL (ref 78.0–100.0)
Platelets: 172 10*3/uL (ref 150–400)
RBC: 4.82 MIL/uL (ref 4.22–5.81)
RDW: 13.2 % (ref 11.5–15.5)
WBC: 5.7 10*3/uL (ref 4.0–10.5)

## 2013-10-21 LAB — AMYLASE: Amylase: 30 U/L (ref 0–105)

## 2013-10-21 NOTE — Progress Notes (Signed)
   Subjective:    Patient ID: [name], male    DOB: [birth year], 58 y.o.   MRN: [id number] This chart was scribed for [name]. Laney Pastor, MD by Terressa Koyanagi, ED Scribe. This patient was seen in room 22 and the patient's care was started at 3:26 PM  Chief Complaint  Patient presents with  . Diabetes    A1c/bloodwork per Endocrinologist    HPI HPI Comments: [name] is a 58 y.o. male, with a history of HTN, DM TII, HLD, OSA and BMI 40.0-44.9 (adult), who presents to the [facility] for a follow-up. Recently referred to Endo-Dr G at [facility]- reports that his endocrinologist prescribed him Vicotza recently and he began to take the med on 10/14/13; pt reports he did not tolerate the Vicotza and experienced HA, slight nausea, tongue swelling and sensitivity, and fatigue while on the med. Thereafter, pt's endocrinologist put him back on Glipizide. Pt reports that he had 150 blood sugar reading yesterday. Pt states it has been a long time since he had a blood sugar level that low. Pt reports he has been trying to watch his diet. Pt reports that he is properly motivated to eat properly and exercise, however, he has not lost any weight yet.  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 No wt loss so far  Review of Systems  Constitutional: Negative for fever, chills, appetite change and unexpected weight change.   Objective:   Physical Exam  Nursing note and vitals reviewed. Constitutional: He is oriented to person, place, and time. He appears well-developed and well-nourished. No distress.  HENT:  Head: Normocephalic and atraumatic.  Eyes: EOM are normal.  Neck: Neck supple.  Cardiovascular: Normal rate.   Pulmonary/Chest: Effort normal. No respiratory distress.  Musculoskeletal: Normal range of motion.  Neurological: He is alert and oriented to person, place, and time.  Skin: Skin is warm and dry.  Psychiatric: He has a normal mood and affect. His  behavior is normal.   BP 120/70  Pulse 74  Temp(Src) 98.1 F (36.7 C) (Oral)  Resp 16  Ht 5\' 11"  (1.803 m)  Wt 304 lb 6.4 oz (138.075 kg)  BMI 42.47 kg/m2  SpO2 95% Results for orders placed in visit on 10/21/13  POCT GLYCOSYLATED HEMOGLOBIN (HGB A1C)      Result Value Ref Range   Hemoglobin A1C 9.5    down from 9.9   Assessment & Plan:  Type II or unspecified type diabetes mellitus without mention of complication, not stated as uncontrolled - ,   Nausea alone--Amylase at his req worrying re pancr and  victoza  Type 2 diabetes mellitus-uncontr  BMI 40.0-44.9, adult  Hyperlipidemia-Lipid panel  Hypertension     I have completed the patient encounter in its entirety as documented by the scribe, with editing by me where necessary. Royce Stegman P. Laney [name], M.D.

## 2013-10-27 ENCOUNTER — Ambulatory Visit (INDEPENDENT_AMBULATORY_CARE_PROVIDER_SITE_OTHER): Payer: BC Managed Care – PPO | Admitting: Internal Medicine

## 2013-10-27 ENCOUNTER — Encounter: Payer: Self-pay | Admitting: Internal Medicine

## 2013-10-27 VITALS — BP 124/72 | HR 80 | Temp 98.3°F | Resp 12 | Wt 309.0 lb

## 2013-10-27 DIAGNOSIS — E119 Type 2 diabetes mellitus without complications: Secondary | ICD-10-CM

## 2013-10-27 MED ORDER — GLIPIZIDE ER 10 MG PO TB24: 10.0000 mg | ORAL_TABLET | Freq: Every day | ORAL | Status: DC

## 2013-10-27 NOTE — Patient Instructions (Addendum)
Please stay on the same regimen. Continue to check sugars 3x a day and bring log at next visit. Please come back for a follow-up appointment in 2 months with your sugar log.

## 2013-10-27 NOTE — Progress Notes (Signed)
Patient ID: [name], male   DOB: [birth year], 58 y.o.   MRN: [id number]  HPI: [name] is a 58 y.o.-year-old male, returning for f/u for DM2, dx 2000, non-insulin-dependent, uncontrolled, without complications. Last visit 1 mo ago.  Last hemoglobin A1c was: Lab Results  Component Value Date   HGBA1C 9.5 10/21/2013   HGBA1C 9.9 09/09/2013   HGBA1C 10.5 07/15/2013   Pt is on a regimen of: - Metformin XR 1000 mg po bid - Glipizide XL 10 mg daily We tried Victoza >> nausea, then HA We tried Invokana >> not approved by [employer]. 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  Highest sugar was 200.  Pt's meals are very high calorie/high fat: - Breakfast: 2 fried eggs + bacon - Lunch: wrap or [place] sandwich or steak or BBQ + baked potato or mushrooms from [employer]; [employer], [place] (tuna with mayo) - Dinner: 3-5 glasses of wine!; chicken or fish + steamed veggies + olives; pizza - Snacks: no  No exercise.  - + CKD, last BUN/creatinine:  Lab Results  Component Value Date   BUN 20 10/21/2013   CREATININE 1.30 10/21/2013  On Lisinopril. - last set of lipids: Lab Results  Component Value Date   CHOL 189 10/21/2013   HDL 37* 10/21/2013   LDLCALC NOT CALC 10/21/2013   TRIG 430* 10/21/2013   CHOLHDL 5.1 10/21/2013  On Zocor. - last eye exam was in ~09/2012. No DR.  - no numbness and tingling in his feet. He has mild chane in sensation on his soles. He had a normal food exam in PCPs office on 10/21/2013.  He also has a history of OSA - compliant 100% with CPAP. He had a heart catheterization: 2-3 years ago >>  normal.  I reviewed pt's medications, allergies, PMH, social hx, family hx and no changes required, except as mentioned above.  ROS: Constitutional: no weight gain/loss, + fatigue, no subjective hyperthermia/hypothermia, + poor sleep, + nocturia Eyes: no blurry vision, no xerophthalmia ENT: no sore throat, no nodules palpated in throat, no dysphagia/odynophagia, no hoarseness, + tinnitus Cardiovascular: no CP/SOB/palpitations/leg swelling Respiratory: no cough/SOB Gastrointestinal: no N/V/+ D/+ C Musculoskeletal: + both: muscle/joint aches: back pain, join pain Skin: no rashes Neurological: no tremors/numbness/tingling/dizziness, + HA  PE: BP 124/72  Pulse 80  Temp(Src) 98.3 F (36.8 C) (Oral)  Resp 12  Wt 309 lb (140.161 kg)  SpO2 95% Wt Readings from Last 3 Encounters:  10/27/13 309 lb (140.161 kg)  10/21/13 304 lb 6.4 oz (138.075 kg)  09/28/13 309 lb (140.161 kg)   Constitutional: obese, in NAD Eyes: PERRLA, EOMI, no exophthalmos ENT: moist mucous membranes, no thyromegaly, no cervical lymphadenopathy Cardiovascular: RRR, No MRG Respiratory: CTA B Gastrointestinal: abdomen soft, NT, ND, BS+ Musculoskeletal: no deformities, strength intact in all 4 Skin: moist, warm, no rashes Neurological: no tremor with outstretched hands, DTR normal in all 4  ASSESSMENT: 1. DM2, non-insulin-dependent, uncontrolled, without complications  PLAN:  1. Patient with long-standing, recently more uncontrolled diabetes, now much  improved after adding Glipizide (He refused insulin at last visit). - I suggested to:  Patient Instructions  Please stay on the same regimen. Continue to check sugars 3x a day and bring log at next visit. Please come back for a follow-up appointment in 2 months with your sugar log.  - again discussed about diet - did not have appt with nutrition yet - advised for yearly eye exams >> needs one - Return to clinic in 3 mo with sugar log

## 2013-11-11 ENCOUNTER — Encounter: Payer: Self-pay | Admitting: Dietician

## 2013-11-11 ENCOUNTER — Encounter: Payer: BC Managed Care – PPO | Attending: Internal Medicine | Admitting: Dietician

## 2013-11-11 VITALS — Ht 72.0 in | Wt 308.0 lb

## 2013-11-11 DIAGNOSIS — Z713 Dietary counseling and surveillance: Secondary | ICD-10-CM | POA: Insufficient documentation

## 2013-11-11 DIAGNOSIS — R635 Abnormal weight gain: Secondary | ICD-10-CM | POA: Insufficient documentation

## 2013-11-11 DIAGNOSIS — M129 Arthropathy, unspecified: Secondary | ICD-10-CM | POA: Insufficient documentation

## 2013-11-11 DIAGNOSIS — E119 Type 2 diabetes mellitus without complications: Secondary | ICD-10-CM | POA: Insufficient documentation

## 2013-11-11 DIAGNOSIS — Z87891 Personal history of nicotine dependence: Secondary | ICD-10-CM | POA: Insufficient documentation

## 2013-11-11 NOTE — Progress Notes (Signed)
Medical Nutrition Therapy:  Appt start time: 1330 end time:  1500.   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 Doesn't like sweets as much, more of a steak and potato type of guy. Before glipizide, good readings were 200, bad were 300s. Sometimes feels decreased touch sensation in feet at night. Afternoon, getting lower blood sugar readings in the afternoon in 90s, about 3 hours after lunch. Drinks red wine - has arthritis, feels that it helps with arthritis. Former smoker - 4 packs a day and 205 lbs in 90s. Adopted eating to replace smoking.2003-2005 335 lbs. 295-305 recently. Has gained 3-4 lbs since started glipizide. Reports that Dr. Renne Crigler concerned with 90, wants between 100-150. Goes to sleep with 150, wakes with 220. Average morning readings now: 160-170, were 250 before.   Patient feels that they consume less than 4 carbohydrate choices at each meal, but upon closer review, could see that some meals he was consuming 6 carbohydrate servings or more. I am not sure that patient was convinced that carbohydrate intake could greater at other meals, it may take some closer examination and practice on his part before he sees if there is opportunity for improvement. Needs further education on heart healthy fat choices and healthy choices when eating out - patient eats many meals from restaurants and has increased fat and sodium intake. Patient has excessive wine intake, patient has initiated a decrease himself and I supported him in a more moderate intake. Husband plans to have Wife  in attendance at follow-up.  Preferred Learning Style:   No preference indicated   Learning Readiness:   Contemplating   MEDICATIONS: See chart   DIETARY INTAKE:    Avoided foods include light shrimp allergy.    24-hr recall:  B ( AM): Before - 2 fried eggs, 2 pieces of canadian bacon, coffee with flavored stevia and skim milk - 1/4 cup or almond  milk instead of skim milk. [employer] Yogurt - flavored - banana cream or orange and coffee now. Diet mountain dew Snk ( AM): none  L ( PM): [facility] - before would get lettuce wrap, now has glipizide on board, will get bread and BBQ chips. [employer] - bread bowl with soup, grilled cesear salad - 75/25 or 50/50 sweet tea Snk ( PM): none D ( PM): 3-4 glasses a day, sometimes 5-6 glasses. Grew up with people that would drink 20 beers. [employer] - one scoop brown rice, beans, lots of vegetables, shares a bag of chips. Loves vegetables, loves salsa at [employer]. Jacks - double kabob, chicken, hummus and salad. [employer], 9 oz sirloin, lobster tail, bowl mushroom, once piece bread, glass of wine. Grilled chicken, grilled vegetables, fruit  Snk ( PM): pretzels with peanut butter, chips,   Noticed correlation between cake intake and higher numbers. Loves bread  This morning 199  Beverages: wine, diet mountain dew, coffee, water, 75 unsweet/25 sweet tea.  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   Nutritional Diagnosis:  NB-1.1 Food and nutrition-related knowledge deficit As related to carbohydrate portion sizes.  As evidenced by A1C above 9.0.    Intervention:  Nutrition Education and counseling.  Plan:  Aim for 4 Carb Choices per meal (45 grams) +/- 1 either way  Aim  for 0-1 Carbs per snack if hungry Consider including protein with meals and snacks Consider reading food labels for Total Carbohydrate and Fat Grams of foods Continue to decrease wine intake to 1-2 glasses per evening versus 5-6  Teaching Method Utilized:  Visual Auditory Hands on  Handouts given during visit include:  Yellow card   Barriers to learning/adherence to lifestyle change:   Demonstrated degree of understanding  via:  Teach Back   Monitoring/Evaluation:  Dietary intake, exercise, carbohydrate counting, and body weight in 8 week(s).

## 2013-11-11 NOTE — Patient Instructions (Signed)
Plan:  Aim for 4 Carb Choices per meal (45 grams) +/- 1 either way  Aim for 0-1 Carbs per snack if hungry Consider including protein with meals and snacks Consider reading food labels for Total Carbohydrate and Fat Grams of foods Continue to decrease wine intake to 1-2 glasses per evening versus 5-6

## 2013-12-09 ENCOUNTER — Encounter: Payer: BC Managed Care – PPO | Attending: Internal Medicine | Admitting: Dietician

## 2013-12-09 VITALS — Wt 309.1 lb

## 2013-12-09 DIAGNOSIS — E119 Type 2 diabetes mellitus without complications: Secondary | ICD-10-CM | POA: Insufficient documentation

## 2013-12-09 DIAGNOSIS — Z713 Dietary counseling and surveillance: Secondary | ICD-10-CM | POA: Insufficient documentation

## 2013-12-09 DIAGNOSIS — Z6841 Body Mass Index (BMI) 40.0 and over, adult: Secondary | ICD-10-CM

## 2013-12-09 DIAGNOSIS — Z87891 Personal history of nicotine dependence: Secondary | ICD-10-CM | POA: Insufficient documentation

## 2013-12-09 DIAGNOSIS — R635 Abnormal weight gain: Secondary | ICD-10-CM | POA: Insufficient documentation

## 2013-12-09 DIAGNOSIS — M129 Arthropathy, unspecified: Secondary | ICD-10-CM | POA: Insufficient documentation

## 2013-12-09 NOTE — Progress Notes (Signed)
  Medical Nutrition Therapy:  Appt start time: 3748 end time:  1545  Assessment:  Primary concerns today: Diabetes and obesity follow up. Patient wanted to have wife with him to review carbohydrate counting again and ways to help lose weight. Patient is consuming the majority of his calories through snacks, dinner, and wine at the end of the day and I believe that this is contributing to his difficulty with managing his weight. Patient also had questions about lap band procedure and was able to talk with Bariatric dietitian Magda Paganini) to have some questions answered while he was here.   MEDICATIONS: See chart  DIETARY INTAKE:  24-hr recall:  B ( AM): 2 eggs, 2 canadian bacon or sometimes yogurt - greek Snk ( AM) : none L ( PM):  [employer] sub and chips, half and half sweet/unsweet tea Snk ( PM): almonds, pretzel/peanut butter bites,  D ( PM): meat, vegetables, fruit  Snk ( PM): chips, almonds, pretzel/peanut butter bites Beverages: Decreased wine consumption to closer to one bottle instead of two, diet mountain dew -   Recent physical activity: Walking amount has decreased.  Reports that he is very sensitive to the heat and sun (skin).  Estimated energy needs: 2000 calories   Progress Towards Goal(s):  In progress.   Nutritional Diagnosis:  [place]-3.3 Overweight/obesity As related to excessive caloric intake in the evenings.  As evidenced by patient report and BMI greater than 30.    Intervention:  Nutrition education and counseling Reviewed carbohydrate counting again. Reviewed importance of blood sugar management throughout the day. Discussed honoring hunger and fullness cues as part of weight management. Discussed ways to decrease caloric intake. Discussed what moderation is for alcohol intake.    Monitoring/Evaluation:  Dietary intake, exercise, carbohydrate counting, and body weight prn.

## 2013-12-19 ENCOUNTER — Encounter: Payer: Self-pay | Admitting: Internal Medicine

## 2013-12-28 ENCOUNTER — Encounter: Payer: Self-pay | Admitting: Internal Medicine

## 2013-12-28 ENCOUNTER — Ambulatory Visit (INDEPENDENT_AMBULATORY_CARE_PROVIDER_SITE_OTHER): Payer: BC Managed Care – PPO | Admitting: Internal Medicine

## 2013-12-28 VITALS — BP 138/84 | HR 81 | Wt 308.0 lb

## 2013-12-28 DIAGNOSIS — E119 Type 2 diabetes mellitus without complications: Secondary | ICD-10-CM | POA: Insufficient documentation

## 2013-12-28 DIAGNOSIS — IMO0002 Reserved for concepts with insufficient information to code with codable children: Secondary | ICD-10-CM | POA: Insufficient documentation

## 2013-12-28 DIAGNOSIS — E1165 Type 2 diabetes mellitus with hyperglycemia: Principal | ICD-10-CM

## 2013-12-28 DIAGNOSIS — E1129 Type 2 diabetes mellitus with other diabetic kidney complication: Secondary | ICD-10-CM

## 2013-12-28 NOTE — Progress Notes (Signed)
Patient ID: [name], male   DOB: 1955-12-04, 58 y.o.   MRN: [id number]  HPI: [name] is a 58 y.o.-year-old male, returning for f/u for DM2, dx 2000, non-insulin-dependent, uncontrolled, with complications (PN, CKD). Last visit 2 mo ago.  Last hemoglobin A1c was: Lab Results  Component Value Date   HGBA1C 9.5 10/21/2013   HGBA1C 9.9 09/09/2013   HGBA1C 10.5 07/15/2013   Pt is on a regimen of: - Metformin XR 1000 mg po bid - Glipizide XL 10 mg daily We tried Victoza >> nausea, then HA We tried Invokana >> not approved by [employer]. 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  Highest sugar was 200.  He is thinking about the lab band recently. He will meet with Dr Hassell Done to discuss about it.  Pt's meals are very high calorie/high fat: - Breakfast: 2 fried eggs + bacon - Lunch: wrap or [place] sandwich or steak or BBQ + baked potato or mushrooms from [employer]; [employer], [place] (tuna with mayo) - Dinner: 3-5 glasses of wine!; chicken or fish + steamed veggies + olives; pizza - Snacks: no  No exercise.  - + CKD, last BUN/creatinine:  Lab Results  Component Value Date   BUN 20 10/21/2013   CREATININE 1.30 10/21/2013  On Lisinopril. - last set of lipids: Lab Results  Component Value Date   CHOL 189 10/21/2013   HDL 37* 10/21/2013   LDLCALC NOT CALC 10/21/2013   TRIG 430* 10/21/2013   CHOLHDL 5.1 10/21/2013  On Zocor. - last eye exam was in ~09/2012. No DR.  - no numbness and tingling in his feet. He has mild chane in sensation on  his soles. He had a normal food exam in PCPs office on 10/21/2013.  He also has a history of OSA - compliant 100% with CPAP. He had a heart catheterization: 2-3 years ago >> normal.  I reviewed pt's medications, allergies, PMH, social hx, family hx and no changes required, except as mentioned above.  ROS: Constitutional: no weight gain/loss, + fatigue, no subjective hyperthermia/hypothermia Eyes: no blurry vision, no xerophthalmia ENT: no sore throat, no nodules palpated in throat, no dysphagia/odynophagia, no hoarseness, + tinnitus Cardiovascular: no CP/SOB/palpitations/leg swelling Respiratory: no cough/SOB Gastrointestinal: no N/V/D/C Musculoskeletal: no muscle/joint aches: back pain, join pain Skin: no rashes Neurological: no tremors/numbness/tingling/dizziness, + HA  PE: BP 138/84  Pulse 81  Wt 308 lb (139.708 kg) Wt Readings from Last 3 Encounters:  12/28/13 308 lb (139.708 kg)  12/09/13 309 lb 1.6 oz (140.207 kg)  11/11/13 308 lb (139.708 kg)   Constitutional: obese, in NAD Eyes: PERRLA, EOMI, no exophthalmos ENT: moist mucous membranes, no thyromegaly, no cervical lymphadenopathy Cardiovascular: RRR, No MRG Respiratory: CTA B Gastrointestinal: abdomen soft, NT, ND, BS+ Musculoskeletal: no deformities, strength intact in all 4 Skin: moist, warm, no rashes Neurological: no tremor with outstretched hands, DTR normal in all 4  ASSESSMENT: 1. DM2,  non-insulin-dependent, uncontrolled, with complications - PN - CKD  PLAN:  1. Patient with long-standing, uncontrolled diabetes, now much improved after adding Glipizide. He still refuses insulin and we do not have many choices in terms of DM meds as he has CKD. He may have lap band surgery - I think this is a great idea for him. - I suggested to:  Patient Instructions  Continue; - Metformin XR 1000 mg 2x a day - Glipizide XL 10 mg daily Please try to limit your food portions. Please come back for a follow-up appointment  in 3 months. - will check HbA1c by PCP in 3 weeks - advised for yearly eye exams >> needs one - bring log at next visit - Return to clinic in 3 mo with sugar log

## 2013-12-28 NOTE — Patient Instructions (Signed)
Continue; - Metformin XR 1000 mg 2x a day - Glipizide XL 10 mg daily Please try to limit your food portions. Please come back for a follow-up appointment in 3 months.

## 2014-01-13 ENCOUNTER — Encounter: Payer: Self-pay | Admitting: Internal Medicine

## 2014-01-13 ENCOUNTER — Encounter (INDEPENDENT_AMBULATORY_CARE_PROVIDER_SITE_OTHER): Payer: Self-pay | Admitting: Surgery

## 2014-01-13 ENCOUNTER — Ambulatory Visit (INDEPENDENT_AMBULATORY_CARE_PROVIDER_SITE_OTHER): Payer: BC Managed Care – PPO | Admitting: Internal Medicine

## 2014-01-13 ENCOUNTER — Ambulatory Visit (INDEPENDENT_AMBULATORY_CARE_PROVIDER_SITE_OTHER): Payer: BC Managed Care – PPO | Admitting: Surgery

## 2014-01-13 ENCOUNTER — Telehealth: Payer: Self-pay | Admitting: Internal Medicine

## 2014-01-13 VITALS — BP 139/84 | HR 65 | Temp 98.2°F | Resp 16 | Ht 71.0 in | Wt 308.4 lb

## 2014-01-13 VITALS — BP 138/92 | HR 72 | Temp 98.5°F | Resp 16 | Ht 72.0 in | Wt 311.6 lb

## 2014-01-13 DIAGNOSIS — I10 Essential (primary) hypertension: Secondary | ICD-10-CM

## 2014-01-13 DIAGNOSIS — E1129 Type 2 diabetes mellitus with other diabetic kidney complication: Secondary | ICD-10-CM

## 2014-01-13 DIAGNOSIS — IMO0002 Reserved for concepts with insufficient information to code with codable children: Secondary | ICD-10-CM

## 2014-01-13 DIAGNOSIS — Z6841 Body Mass Index (BMI) 40.0 and over, adult: Secondary | ICD-10-CM

## 2014-01-13 DIAGNOSIS — R7989 Other specified abnormal findings of blood chemistry: Secondary | ICD-10-CM

## 2014-01-13 DIAGNOSIS — R945 Abnormal results of liver function studies: Secondary | ICD-10-CM

## 2014-01-13 DIAGNOSIS — E8881 Metabolic syndrome: Secondary | ICD-10-CM

## 2014-01-13 DIAGNOSIS — E785 Hyperlipidemia, unspecified: Secondary | ICD-10-CM

## 2014-01-13 DIAGNOSIS — E1165 Type 2 diabetes mellitus with hyperglycemia: Principal | ICD-10-CM

## 2014-01-13 LAB — CBC WITH DIFFERENTIAL/PLATELET
Basophils Absolute: 0 10*3/uL (ref 0.0–0.1)
Basophils Relative: 0 % (ref 0–1)
Eosinophils Absolute: 0.1 10*3/uL (ref 0.0–0.7)
Eosinophils Relative: 2 % (ref 0–5)
HCT: 43.9 % (ref 39.0–52.0)
Hemoglobin: 15.3 g/dL (ref 13.0–17.0)
Lymphocytes Relative: 40 % (ref 12–46)
Lymphs Abs: 1.8 10*3/uL (ref 0.7–4.0)
MCH: 31.8 pg (ref 26.0–34.0)
MCHC: 34.9 g/dL (ref 30.0–36.0)
MCV: 91.3 fL (ref 78.0–100.0)
Monocytes Absolute: 0.3 10*3/uL (ref 0.1–1.0)
Monocytes Relative: 7 % (ref 3–12)
Neutro Abs: 2.2 10*3/uL (ref 1.7–7.7)
Neutrophils Relative %: 51 % (ref 43–77)
Platelets: 169 10*3/uL (ref 150–400)
RBC: 4.81 MIL/uL (ref 4.22–5.81)
RDW: 12.9 % (ref 11.5–15.5)
WBC: 4.4 10*3/uL (ref 4.0–10.5)

## 2014-01-13 LAB — COMPREHENSIVE METABOLIC PANEL
ALT: 35 U/L (ref 0–53)
AST: 35 U/L (ref 0–37)
Albumin: 4.3 g/dL (ref 3.5–5.2)
Alkaline Phosphatase: 42 U/L (ref 39–117)
BUN: 13 mg/dL (ref 6–23)
CO2: 28 mEq/L (ref 19–32)
Calcium: 9.1 mg/dL (ref 8.4–10.5)
Chloride: 97 mEq/L (ref 96–112)
Creat: 1.13 mg/dL (ref 0.50–1.35)
Glucose, Bld: 227 mg/dL — ABNORMAL HIGH (ref 70–99)
Potassium: 3.7 mEq/L (ref 3.5–5.3)
Sodium: 136 mEq/L (ref 135–145)
Total Bilirubin: 0.6 mg/dL (ref 0.2–1.2)
Total Protein: 6.6 g/dL (ref 6.0–8.3)

## 2014-01-13 LAB — IRON AND TIBC
%SAT: 43 % (ref 20–55)
Iron: 148 ug/dL (ref 42–165)
TIBC: 347 ug/dL (ref 215–435)
UIBC: 199 ug/dL (ref 125–400)

## 2014-01-13 LAB — T4, FREE: Free T4: 1.08 ng/dL (ref 0.80–1.80)

## 2014-01-13 LAB — LIPID PANEL
Cholesterol: 189 mg/dL (ref 0–200)
HDL: 37 mg/dL — ABNORMAL LOW (ref >39)
LDL Cholesterol: 76 mg/dL (ref 0–99)
Total CHOL/HDL Ratio: 5.1 Ratio
Triglycerides: 378 mg/dL — ABNORMAL HIGH (ref <150)
VLDL: 76 mg/dL — ABNORMAL HIGH (ref 0–40)

## 2014-01-13 LAB — TSH: TSH: 1.942 u[IU]/mL (ref 0.350–4.500)

## 2014-01-13 LAB — POCT GLYCOSYLATED HEMOGLOBIN (HGB A1C): Hemoglobin A1C: 8

## 2014-01-13 LAB — FOLATE: Folate: >20 ng/mL

## 2014-01-13 NOTE — Patient Instructions (Signed)
Gastric Banding Surgery Care After Refer to this sheet in the next few weeks. These discharge instructions provide you with general information on caring for yourself after you leave the hospital. Your caregiver may also give you specific instructions. Your treatment has been planned according to the most current medical practices available, but unavoidable complications sometimes occur. If you have any problems or questions after discharge, call your caregiver. HOME CARE INSTRUCTIONS  Activity  Take frequent walks throughout the day. This will help to prevent blood clots. Do not sit for longer than 45 minutes to 1 hour while awake for 4 to 6 weeks after surgery.  Continue to do coughing and deep breathing exercises once you get home. This will help to prevent pneumonia.  Do not do strenuous activities, such as heavy lifting, pushing, or pulling, until after your follow-up visit with your caregiver. Do not lift anything heavier than 10 lb (4.5 kg).  Talk with your caregiver about when you may return to work and your exercise routine.  Do not drive while taking prescription pain medicine. Nutrition  It is very important that you drink at least 80 oz (2,400 mL) of fluid a day.  You should stay on a liquid diet until your follow-up visit with your caregiver. Keep sugar-free, liquid items on hand, including:  Tea: hot or cold. Drink only decaffeinated for the first month.  Broths: beef, chicken, vegetable.  Others: water, sugar-free frozen ice pops, flavored water, gelatin (after 1 week).  Do not consume caffeine for 1 month. Large amounts of caffeine can cause dehydration.  A dietician may also give you specific instructions.  Follow your caregiver's recommendations about vitamins and protein requirements after surgery. Hygiene  You may shower and wash your hair 2 days after surgery. Pat incisions dry. Do not rub incisions with a washcloth or towel.  Follow your caregiver's  recommendations about baths and pools following surgery. Pain control  If a prescription medicine was given, follow your caregiver's directions.  You may feel some gas pain caused by the carbon dioxide used to inflate your abdomen during surgery. This pain can be felt in your chest, shoulder, back, or abdominal area. Moving around often is advised. Incision care You may have 4 or more small incisions. They are closed with skin adhesive strips and have a clear plastic covering over them. You may remove your dressings the number of days directed by your caregiver after surgery. Check your incisions and surrounding area daily for any redness, swelling, discoloration, fluid (drainage), or bleeding. Dark red, dried blood may appear under these coverings. This is normal. SEEK MEDICAL CARE IF:   You develop persistent nausea and vomiting.  You have pain and discomfort with swallowing.  You have pain, swelling, or warmth in the lower extremities.  You have an oral temperature above 102 F (38.9 C).  You develop chills.  Your incision sites look red, swollen, or have drainage.  Your stool is black, tarry, or maroon in color.  You are lightheaded when standing.  You have any questions or concerns. SEEK IMMEDIATE MEDICAL CARE IF:   You have chest pain.  You have severe calf pain or pain not relieved by medicine.  You develop shortness of breath or difficulty breathing.  You feel confused.  You have slurred speech.  You suddenly feel weak. MAKE SURE YOU:   Understand these instructions.  Will watch your condition.  Will get help right away if you are not doing well or get worse. Document Released:  01/17/2004 Document Revised: 09/29/2012 Document Reviewed: 10/25/2009 ExitCare Patient Information 2015 [place], [place]. This information is not intended to replace advice given to you by your health care provider. Make sure you discuss any questions you have with your health care  provider.

## 2014-01-13 NOTE — Telephone Encounter (Signed)
Patient requests that you call him, regarding the [facility] records.

## 2014-01-13 NOTE — Telephone Encounter (Signed)
Called pt back. Pt was confused because he said he already sees the Nutrition [facility] Diabetes group (and has seen them 2 times). Advised pt that we would figure this out. Spoke with Leonia Reader, Diabetes Educater and she said that there was some confusion. Pt was scheduled to see her but said he did not need to see anyone else due to the fact he is being seen upstairs at [place].

## 2014-01-13 NOTE — Progress Notes (Signed)
Chief Complaint:  Morbid obesity BMI 42 with type 2 diabetes  History of Present Illness:  [name] is an 57 y.o. male and comes in with his wife to discuss bariatric surgery. He is attended are all Seminar and is decided that he would like have a laparoscopic adjustable gastric band. Being a type II diabetic I talked to him about the data supporting gastric bypass for type 2 diabetes. Looking at his overall constellation of findings he has metabolic syndrome. Think the lap band would work which is to may be a more difficult achieved that success and has emphasized would require followup.. He has obstructive sleep apnea so we will need to repeat that. We will go ahead and move forward with the other test for journey to lead [place] toward laparoscopic adjustable gastric banding.  Past Medical History  Diagnosis Date  . Diabetes mellitus   . Hypertension   . Rotator cuff tear   . Dyslipidemia   . Morbid obesity   . Hyperlipidemia   . Sleep apnea   . OSA (obstructive sleep apnea)     Past Surgical History  Procedure Laterality Date  . Knee surgery    . Vasectomy      Current Outpatient Prescriptions  Medication Sig Dispense Refill  . amLODipine (NORVASC) 5 MG tablet Take 1 tablet (5 mg total) by mouth daily.  180 tablet  0  . aspirin 81 MG tablet Take 81 mg by mouth daily.      Marland Kitchen glipiZIDE (GLIPIZIDE XL) 10 MG 24 hr tablet Take 1 tablet (10 mg total) by mouth daily with breakfast.  180 tablet  1  . Lactobacillus (DIGESTIVE HEALTH PROBIOTIC PO) Take 1 capsule by mouth 1 day or 1 dose.      Marland Kitchen lisinopril-hydrochlorothiazide (PRINZIDE,ZESTORETIC) 20-12.5 MG per tablet Take 2 tablets by mouth daily.  360 tablet  0  . loratadine (CLARITIN) 10 MG tablet Take 10 mg by mouth daily.      . magnesium 30 MG tablet Take 30 mg by mouth daily.       . metFORMIN (GLUCOPHAGE-XR) 500 MG 24 hr tablet Take 4 tablets (2,000 mg total) by mouth daily with breakfast.  720 tablet  0  . metoprolol  (LOPRESSOR) 100 MG tablet Take 1 tablet (100 mg total) by mouth daily.  180 tablet  0  . Multiple Vitamin (MULTIVITAMIN) tablet Take 1 tablet by mouth daily.      Marland Kitchen PRESCRIPTION MEDICATION Glipizide ER 10 mg taking started this am      . simvastatin (ZOCOR) 20 MG tablet TAKE 1 TABLET BY MOUTH EVERY EVENING.  180 tablet  0  . vitamin C (ASCORBIC ACID) 500 MG tablet Take 1,000 mg by mouth daily.      Marland Kitchen azithromycin (ZITHROMAX) 250 MG tablet As packaged  6 tablet  1   No current facility-administered medications for this visit.   Erythromycin and Penicillins History reviewed. No pertinent family history. Social History:   reports that he quit smoking about 22 years ago. His smoking use included Cigarettes. He started smoking about 40 years ago. He has a 36 pack-year smoking history. He does not have any smokeless tobacco history on file. He reports that he drinks alcohol. He reports that he does not use illicit drugs.   REVIEW OF SYSTEMS : Positive for obstructive sleep apnea even before he was obese. His obesity began in the 90s when he quit smoking ; otherwise negative  Physical Exam:   Blood pressure  138/92, pulse 72, temperature 98.5 F (36.9 C), temperature source Oral, resp. rate 16, height 6' (1.829 m), weight 311 lb 9.6 oz (141.341 kg). Body mass index is 42.25 kg/(m^2).  Gen:  WDWN white male NAD  Neurological: Alert and oriented to person, place, and time. Motor and sensory function is grossly intact  Head: Normocephalic and atraumatic.  Eyes: Conjunctivae are normal. Pupils are equal, round, and reactive to light. No scleral icterus.  Neck: Normal range of motion. Neck supple. No tracheal deviation or thyromegaly present.  Cardiovascular:  SR without murmurs or gallops.  No carotid bruits Breast:  Not examined Respiratory: Effort normal.  No respiratory distress. No chest wall tenderness. Breath sounds normal.  No wheezes, rales or rhonchi.  Abdomen:  Protuberant with a mild  diastases recti GU:  Not examined Musculoskeletal: Normal range of motion. Extremities are nontender. No cyanosis, edema or clubbing noted Lymphadenopathy: No cervical, preauricular, postauricular or axillary adenopathy is present Skin: Skin is warm and dry. No rash noted. No diaphoresis. No erythema. No pallor. Pscyh: Normal mood and affect. Behavior is normal. Judgment and thought content normal.   LABORATORY RESULTS: No results found for this or any previous visit (from the past 48 hour(s)).   RADIOLOGY RESULTS: No results found.  Problem List: Patient Active Problem List   Diagnosis Date Noted  . DM type 2, uncontrolled, with renal complications 68/61/6837  . Hearing loss-aides 03/26/2012  . Back pain 12/26/2011  . OSA (obstructive sleep apnea) 12/26/2011  . Cervical disc disease 12/26/2011  . Hypertension 08/29/2011  . Hyperlipidemia 08/29/2011  . BMI 40.0-44.9, adult 08/29/2011    Assessment & Plan: Morbid obesity type 2 diabetes and metabolic syndrome. Patient would like to pursue laparoscopic adjustable gastric banding. Will move toward that end.    Matt B. Hassell Done, MD, [facility] [phone] beeper [phone]  01/13/2014 10:53 AM

## 2014-01-13 NOTE — Addendum Note (Signed)
Addended by: Ivor Costa on: 01/13/2014 11:00 AM   Modules accepted: Orders

## 2014-01-13 NOTE — Progress Notes (Signed)
Subjective:  This chart was scribed for Dr. Linton Ham. Laney Pastor, MD  by Stacy Gardner, [facility] Scribe. The patient was seen in room and the patient's care was started at 11:39 AM.  Chief Complaint  Patient presents with  . Follow-up    3 mth chk  A1C chk - Pt did eat yogurt this am     Patient ID: [name], male    DOB: [birth year], 58 y.o.   MRN: [id number]  01/13/2014  Follow-up   HPI HPI Comments: [name] is a 58 y.o. male  w/ hx of DM w/ renal complications, HTN, back pain, hyperlipidemia, and cervical disc disease who arrives to the [facility] for a follow up and for a 3 months A1C check. Pt only ate yogurt this morning.   New intermittent sxtoms=Pt feels a cool sensation to his calves at night. Denies aching throughout the day with walking and numbness.  He has generalized body aches for the past three weeks waking in the am,gone by noon. The pain usually resolves by his lunch time.  He feels intermittent SOB with walking over 100 ft.  He also reports feeling overheated at night and have to get out of bed to cool off. He had bilateral hand swelling but this has resolved. He stopped drinking diet soda. Pt reports sleeping normally and denies sleep disturbances --uses cpap  He is interested in the lap band adjustable surgery-has finished the seminar, and has met with Dr. Hassell Done about the surgery. He has been seeing Dr. Cruzita Lederer re AODM control=Pt is on a regimen of:    - Metformin XR 1000 mg po bid    - Glipizide XL 10 mg daily     We tried Victoza >> nausea, then HA     We tried Invokana >> not approved by [employer].     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 Patient Active Problem List   Diagnosis Date Noted  . DM type 2, uncontrolled, with renal complications 31/54/0086  . Hearing loss-aides  03/26/2012  . Back pain 12/26/2011  . OSA (obstructive sleep apnea) 12/26/2011  . Cervical disc disease 12/26/2011  . Hypertension 08/29/2011  . Hyperlipidemia 08/29/2011  . BMI 40.0-44.9, adult 08/29/2011    Past Surgical History  Procedure Laterality Date  . Knee surgery    . Vasectomy     Allergies  Allergen Reactions  . Erythromycin   . Penicillins     Upset stomach   Prior to Admission medications   Medication Sig Start Date End Date Taking? Authorizing Provider  amLODipine (NORVASC) 5 MG tablet Take 1 tablet (5 mg total) by mouth daily. 09/24/13  Yes Leandrew Koyanagi, MD  aspirin 81 MG tablet Take 81 mg by mouth daily.   Yes Historical Provider, MD  glipiZIDE (GLIPIZIDE XL) 10 MG 24 hr tablet Take 1 tablet (10 mg total) by mouth daily with breakfast. 10/27/13  Yes Philemon Kingdom, MD  Lactobacillus (DIGESTIVE HEALTH PROBIOTIC PO) Take 1 capsule by mouth 1 day or 1 dose.   Yes Historical Provider, MD  lisinopril-hydrochlorothiazide (PRINZIDE,ZESTORETIC) 20-12.5 MG per tablet Take 2 tablets by mouth daily. 09/24/13 09/24/14 Yes Leandrew Koyanagi, MD  loratadine (CLARITIN) 10 MG tablet Take 10 mg by mouth daily.   Yes Historical Provider,  MD  magnesium 30 MG tablet Take 30 mg by mouth daily.    Yes Historical Provider, MD  metFORMIN (GLUCOPHAGE-XR) 500 MG 24 hr tablet Take 4 tablets (2,000 mg total) by mouth daily with breakfast. 09/24/13  Yes Leandrew Koyanagi, MD  metoprolol (LOPRESSOR) 100 MG tablet Take 1 tablet (100 mg total) by mouth daily. 09/24/13  Yes Leandrew Koyanagi, MD  Multiple Vitamin (MULTIVITAMIN) tablet Take 1 tablet by mouth daily.   Yes Historical Provider, MD  simvastatin (ZOCOR) 20 MG tablet TAKE 1 TABLET BY MOUTH EVERY EVENING. 09/24/13  Yes Leandrew Koyanagi, MD  vitamin C (ASCORBIC ACID) 500 MG tablet Take 1,000 mg by mouth daily.   Yes Historical Provider, MD    Review of Systems  Constitutional: Negative for activity change, fatigue and unexpected weight  change.  Eyes: Negative for visual disturbance.  Respiratory: Negative for cough and chest tightness.   Cardiovascular: Positive for leg swelling. Negative for chest pain and palpitations.       Intermittent lower extremity edema  Gastrointestinal: Negative for abdominal pain.  Genitourinary: Negative for difficulty urinating.  Musculoskeletal: Positive for arthralgias and myalgias.  Neurological: Negative for weakness, numbness and headaches.  Psychiatric/Behavioral: Negative for confusion, sleep disturbance, dysphoric mood and decreased concentration.      Objective:     Filed Vitals:   01/13/14 1126  BP: 139/84  Pulse: 65  Temp: 98.2 F (36.8 C)  TempSrc: Oral  Resp: 16  Height: '5\' 11"'  (1.803 m)  Weight: 308 lb 6.4 oz (139.889 kg)  SpO2: 96%   Physical Exam  Nursing note and vitals reviewed. Constitutional: He is oriented to person, place, and time. He appears well-developed and well-nourished.  Wt Readings from Last 3 Encounters: 01/13/14 : 308 lb 6.4 oz (139.889 kg) 01/13/14 : 311 lb 9.6 oz (141.341 kg) 12/28/13 : 308 lb (139.708 kg)   HENT:  Head: Normocephalic.  Eyes: Conjunctivae and EOM are normal. Pupils are equal, round, and reactive to light.  Neck: Normal range of motion. No thyromegaly present.  Cardiovascular: Normal rate, regular rhythm and normal heart sounds.   No murmur heard. No carotid bruit  Pulmonary/Chest: Effort normal and breath sounds normal. No respiratory distress.  Musculoskeletal: Normal range of motion. He exhibits edema.  1+ pitting edema of the lower extremities  Lymphadenopathy:    He has no cervical adenopathy.  Neurological: He is alert and oriented to person, place, and time. No cranial nerve deficit.  Skin: He is not diaphoretic.  Psychiatric: He has a normal mood and affect. His behavior is normal. Judgment and thought content normal.     Assessment & Plan:  DM type 2, uncontrolled, with renal complications - Plan: POCT  glycosylated hemoglobin (Hb A1C)  BMI 40.0-44.9, adult - Plan: Iron and TIBC, CBC with Differential/tsh freet4  Essential hypertension - Plan: CBC with Differential, Comprehensive metabolic panel  Hyperlipidemia - Plan: Lipid panel  Abnormal LFTs - Plan: Hepatitis C antibody,  These labs were also recommended by Dr. Dr. Hassell Done before next visit 7/31  Results for orders placed in visit on 01/13/14  LIPID PANEL      Result Value Ref Range   Cholesterol 189  0 - 200 mg/dL   Triglycerides 378 (*) <150 mg/dL   HDL 37 (*) >39 mg/dL   Total CHOL/HDL Ratio 5.1     VLDL 76 (*) 0 - 40 mg/dL   LDL Cholesterol 76  0 - 99 mg/dL  IRON AND TIBC  Result Value Ref Range   Iron 148  42 - 165 ug/dL   UIBC 199  125 - 400 ug/dL   TIBC 347  215 - 435 ug/dL   %SAT 43  20 - 55 %  FOLATE      Result Value Ref Range   Folate >20.0    T4, FREE      Result Value Ref Range   Free T4 1.08  0.80 - 1.80 ng/dL  TSH      Result Value Ref Range   TSH 1.942  0.350 - 4.500 uIU/mL  CBC WITH DIFFERENTIAL      Result Value Ref Range   WBC 4.4  4.0 - 10.5 K/uL   RBC 4.81  4.22 - 5.81 MIL/uL   Hemoglobin 15.3  13.0 - 17.0 g/dL   HCT 43.9  39.0 - 52.0 %   MCV 91.3  78.0 - 100.0 fL   MCH 31.8  26.0 - 34.0 pg   MCHC 34.9  30.0 - 36.0 g/dL   RDW 12.9  11.5 - 15.5 %   Platelets 169  150 - 400 K/uL   Neutrophils Relative % 51  43 - 77 %   Neutro Abs 2.2  1.7 - 7.7 K/uL   Lymphocytes Relative 40  12 - 46 %   Lymphs Abs 1.8  0.7 - 4.0 K/uL   Monocytes Relative 7  3 - 12 %   Monocytes Absolute 0.3  0.1 - 1.0 K/uL   Eosinophils Relative 2  0 - 5 %   Eosinophils Absolute 0.1  0.0 - 0.7 K/uL   Basophils Relative 0  0 - 1 %   Basophils Absolute 0.0  0.0 - 0.1 K/uL   Smear Review Criteria for review not met    COMPREHENSIVE METABOLIC PANEL      Result Value Ref Range   Sodium 136  135 - 145 mEq/L   Potassium 3.7  3.5 - 5.3 mEq/L   Chloride 97  96 - 112 mEq/L   CO2 28  19 - 32 mEq/L   Glucose, Bld 227 (*)  70 - 99 mg/dL   BUN 13  6 - 23 mg/dL   Creat 1.13  0.50 - 1.35 mg/dL   Total Bilirubin 0.6  0.2 - 1.2 mg/dL   Alkaline Phosphatase 42  39 - 117 U/L   AST 35  0 - 37 U/L   ALT 35  0 - 53 U/L   Total Protein 6.6  6.0 - 8.3 g/dL   Albumin 4.3  3.5 - 5.2 g/dL   Calcium 9.1  8.4 - 10.5 mg/dL  HEPATITIS C ANTIBODY      Result Value Ref Range   HCV Ab NEGATIVE  NEGATIVE  POCT GLYCOSYLATED HEMOGLOBIN (HGB A1C)      Result Value Ref Range   Hemoglobin A1C 8.0     This represents a decrease in his hemoglobin A1c below 9 for the first time in several months  F/u 3 mos///continue with Dr. Hassell Done and Dr. Cruzita Lederer He is considering hypnotherapy for diet-controlled He needs to continue to decrease his intake of red wine Better diabetes and weight control will improve his triglyceride profile Letter for bariatric surgery sent  I personally performed the services described in this documentation, which was scribed in my presence. The recorded information has been reviewed and is accurate.

## 2014-01-14 ENCOUNTER — Encounter: Payer: Self-pay | Admitting: Internal Medicine

## 2014-01-14 LAB — HEPATITIS C ANTIBODY: HCV Ab: NEGATIVE

## 2014-01-15 ENCOUNTER — Encounter: Payer: Self-pay | Admitting: Internal Medicine

## 2014-02-04 ENCOUNTER — Other Ambulatory Visit (HOSPITAL_COMMUNITY): Payer: BC Managed Care – PPO

## 2014-02-04 ENCOUNTER — Ambulatory Visit (HOSPITAL_COMMUNITY): Payer: BC Managed Care – PPO

## 2014-02-10 ENCOUNTER — Encounter: Payer: Self-pay | Admitting: Internal Medicine

## 2014-02-10 ENCOUNTER — Ambulatory Visit (HOSPITAL_COMMUNITY)
Admission: RE | Admit: 2014-02-10 | Discharge: 2014-02-10 | Disposition: A | Discharge status: Home / Self Care | Payer: BC Managed Care – PPO | Source: Ambulatory Visit | Attending: Surgery | Admitting: Surgery

## 2014-02-10 DIAGNOSIS — E119 Type 2 diabetes mellitus without complications: Secondary | ICD-10-CM | POA: Diagnosis not present

## 2014-02-10 DIAGNOSIS — E785 Hyperlipidemia, unspecified: Secondary | ICD-10-CM | POA: Insufficient documentation

## 2014-02-10 DIAGNOSIS — Z6841 Body Mass Index (BMI) 40.0 and over, adult: Secondary | ICD-10-CM | POA: Insufficient documentation

## 2014-02-10 DIAGNOSIS — G4733 Obstructive sleep apnea (adult) (pediatric): Secondary | ICD-10-CM | POA: Diagnosis not present

## 2014-02-10 DIAGNOSIS — Q618 Other cystic kidney diseases: Secondary | ICD-10-CM | POA: Diagnosis not present

## 2014-02-10 DIAGNOSIS — I10 Essential (primary) hypertension: Secondary | ICD-10-CM | POA: Insufficient documentation

## 2014-02-10 DIAGNOSIS — E8881 Metabolic syndrome: Secondary | ICD-10-CM

## 2014-02-10 DIAGNOSIS — K449 Diaphragmatic hernia without obstruction or gangrene: Secondary | ICD-10-CM | POA: Insufficient documentation

## 2014-02-15 ENCOUNTER — Encounter: Payer: Self-pay | Admitting: Internal Medicine

## 2014-02-16 ENCOUNTER — Encounter: Payer: BC Managed Care – PPO | Attending: Internal Medicine | Admitting: Dietician

## 2014-02-16 ENCOUNTER — Encounter: Payer: Self-pay | Admitting: Dietician

## 2014-02-16 VITALS — Ht 72.0 in | Wt 312.8 lb

## 2014-02-16 DIAGNOSIS — E119 Type 2 diabetes mellitus without complications: Secondary | ICD-10-CM | POA: Insufficient documentation

## 2014-02-16 DIAGNOSIS — Z87891 Personal history of nicotine dependence: Secondary | ICD-10-CM | POA: Insufficient documentation

## 2014-02-16 DIAGNOSIS — Z713 Dietary counseling and surveillance: Secondary | ICD-10-CM | POA: Insufficient documentation

## 2014-02-16 DIAGNOSIS — M129 Arthropathy, unspecified: Secondary | ICD-10-CM | POA: Insufficient documentation

## 2014-02-16 DIAGNOSIS — Z6841 Body Mass Index (BMI) 40.0 and over, adult: Secondary | ICD-10-CM

## 2014-02-16 DIAGNOSIS — R635 Abnormal weight gain: Secondary | ICD-10-CM | POA: Insufficient documentation

## 2014-02-16 NOTE — Progress Notes (Signed)
  Pre-Op Assessment Visit:  Pre-Operative LAGB Surgery  Medical Nutrition Therapy:  Appt start time: 1400   End time:  1500.  Patient was seen on 02/16/2014 for Pre-Operative LAGB Nutrition Assessment. Assessment and letter of approval faxed to [facility] Bariatric Surgery Program coordinator on 02/16/2014.   Preferred Learning Style:   No preference indicated   Learning Readiness:   Contemplating  Handouts given during visit include:  Pre-Op Goals Bariatric Surgery Protein Shakes  Teaching Method Utilized:  Visual Auditory Hands on  Barriers to learning/adherence to lifestyle change: food preferences  Demonstrated degree of understanding via:  Teach Back   Patient to call the [facility] to enroll in Pre-Op and Post-Op Nutrition Education when surgery date is scheduled.

## 2014-03-16 ENCOUNTER — Telehealth: Payer: Self-pay | Admitting: Dietician

## 2014-03-16 NOTE — Telephone Encounter (Signed)
Sent [place] an email with Pre-Op Diet to follow and which protein shakes to use in the Pre-Op Diet.

## 2014-03-22 ENCOUNTER — Encounter: Payer: BC Managed Care – PPO | Attending: Internal Medicine

## 2014-03-22 VITALS — Ht 72.0 in | Wt 312.5 lb

## 2014-03-22 DIAGNOSIS — E669 Obesity, unspecified: Secondary | ICD-10-CM | POA: Diagnosis not present

## 2014-03-22 DIAGNOSIS — Z01818 Encounter for other preprocedural examination: Secondary | ICD-10-CM | POA: Insufficient documentation

## 2014-03-22 DIAGNOSIS — Z6841 Body Mass Index (BMI) 40.0 and over, adult: Secondary | ICD-10-CM | POA: Diagnosis not present

## 2014-03-22 DIAGNOSIS — Z713 Dietary counseling and surveillance: Secondary | ICD-10-CM | POA: Insufficient documentation

## 2014-03-22 NOTE — Patient Instructions (Signed)
Follow:   Pre-Op Diet per MD 2 weeks prior to surgery  Phase 2- Liquids (clear/full) 2 weeks after surgery  Vitamin/Mineral/Calcium guidelines for purchasing bariatric supplements  Exercise guidelines pre and post-op per MD  Follow-up at [facility] in 2 weeks post-op for diet advancement. Contact Leslie Williams or Liz Schonthal as needed with questions/concerns. 

## 2014-03-22 NOTE — Progress Notes (Signed)
  Pre-Operative Nutrition Class:  Appt start time: 830   End time:  930.  Patient was seen on 03/22/14 for Pre-Operative Bariatric Surgery Education at the [facility].   Surgery date: 04/05/2014 Surgery type: LAGB Start weight at [facility]: 312 lbs on 02/16/2014 Weight today: 312.5 lbs  TANITA  BODY COMP RESULTS  03/22/14   BMI (kg/m^2) 42.4   Fat Mass (lbs) 130.5   Fat Free Mass (lbs) 182.0   Total Body Water (lbs) 133.0   Samples given per MNT protocol. Patient educated on appropriate usage: Bariatric Advantage Multivitamin Lot # [id number] Exp:01/2014  Bariatric Advantage Calcium Citrate Chocolate Lot # [id number] Exp: 11/2014  Bariatric Advantage Peppermint B12 Lot # [id number] Exp: 05/2014  Renee Pain Protein Powder Vanilla Lot # [id number] Exp: 03/2015  Premier Protein Chocolate Lot # [id number] Exp: 02/21/2015  The following the learning objectives were met by the patient during this course:  Identify Pre-Op Dietary Goals and will begin 2 weeks pre-operatively  Identify appropriate sources of fluids and proteins   State protein recommendations and appropriate sources pre and post-operatively  Identify Post-Operative Dietary Goals and will follow for 2 weeks post-operatively  Identify appropriate multivitamin and calcium sources  Describe the need for physical activity post-operatively and will follow MD recommendations  State when to call healthcare provider regarding medication questions or post-operative complications  Handouts given during class include:  Pre-Op Bariatric Surgery Diet Handout  Protein Shake Handout  Post-Op Bariatric Surgery Nutrition Handout  BELT Program Information Flyer  Support Group Information Flyer  [facility] Bariatric Supplements Price List  Follow-Up Plan: Patient will follow-up at [facility] 2 weeks post operatively for diet advancement per MD.

## 2014-03-23 NOTE — Progress Notes (Signed)
Please put orders in Epic surgery 04-05-14 pre op 03-26-14 Thanks

## 2014-03-25 ENCOUNTER — Encounter: Payer: Self-pay | Admitting: Internal Medicine

## 2014-03-25 ENCOUNTER — Other Ambulatory Visit (INDEPENDENT_AMBULATORY_CARE_PROVIDER_SITE_OTHER): Payer: Self-pay | Admitting: Surgery

## 2014-03-25 ENCOUNTER — Encounter (HOSPITAL_COMMUNITY): Payer: Self-pay | Admitting: Pharmacy Technician

## 2014-03-25 NOTE — H&P (Signed)
Chief Complaint: Morbid obesity BMI 42 with type 2 diabetes  History of Present Illness: [name] is an 58 y.o. male and comes in with his wife to discuss bariatric surgery. He is attended are all  Seminar and is decided that he would like have a laparoscopic adjustable gastric band. Being a type II diabetic I talked to him about the data supporting gastric bypass for type 2 diabetes. Looking at his overall constellation of findings he has metabolic syndrome. Think the lap band would work which is to may be a more difficult achieved that success and has emphasized would require followup.. He has obstructive sleep apnea so we will need to repeat that. We will go ahead and move forward with the other test for journey to lead [place] toward laparoscopic adjustable gastric banding.  Past Medical History   Diagnosis  Date   .  Diabetes mellitus    .  Hypertension    .  Rotator cuff tear    .  Dyslipidemia    .  Morbid obesity    .  Hyperlipidemia    .  Sleep apnea    .  OSA (obstructive sleep apnea)     Past Surgical History   Procedure  Laterality  Date   .  Knee surgery     .  Vasectomy      Current Outpatient Prescriptions   Medication  Sig  Dispense  Refill   .  amLODipine (NORVASC) 5 MG tablet  Take 1 tablet (5 mg total) by mouth daily.  180 tablet  0   .  aspirin 81 MG tablet  Take 81 mg by mouth daily.     Marland Kitchen  glipiZIDE (GLIPIZIDE XL) 10 MG 24 hr tablet  Take 1 tablet (10 mg total) by mouth daily with breakfast.  180 tablet  1   .  Lactobacillus (DIGESTIVE HEALTH PROBIOTIC PO)  Take 1 capsule by mouth 1 day or 1 dose.     Marland Kitchen  lisinopril-hydrochlorothiazide (PRINZIDE,ZESTORETIC) 20-12.5 MG per tablet  Take 2 tablets by mouth daily.  360 tablet  0   .  loratadine (CLARITIN) 10 MG tablet  Take 10 mg by mouth daily.     .  magnesium 30 MG tablet  Take 30 mg by mouth daily.     .  metFORMIN (GLUCOPHAGE-XR) 500 MG 24 hr tablet  Take 4 tablets (2,000 mg total) by mouth daily with breakfast.   720 tablet  0   .  metoprolol (LOPRESSOR) 100 MG tablet  Take 1 tablet (100 mg total) by mouth daily.  180 tablet  0   .  Multiple Vitamin (MULTIVITAMIN) tablet  Take 1 tablet by mouth daily.     Marland Kitchen  PRESCRIPTION MEDICATION  Glipizide ER 10 mg taking started this am     .  simvastatin (ZOCOR) 20 MG tablet  TAKE 1 TABLET BY MOUTH EVERY EVENING.  180 tablet  0   .  vitamin C (ASCORBIC ACID) 500 MG tablet  Take 1,000 mg by mouth daily.     Marland Kitchen  azithromycin (ZITHROMAX) 250 MG tablet  As packaged  6 tablet  1    No current facility-administered medications for this visit.   Erythromycin and Penicillins  History reviewed. No pertinent family history.  Social History: reports that he quit smoking about 22 years ago. His smoking use included Cigarettes. He started smoking about 40 years ago. He has a 36 pack-year smoking history. He does not have any  smokeless tobacco history on file. He reports that he drinks alcohol. He reports that he does not use illicit drugs.  REVIEW OF SYSTEMS :  Positive for obstructive sleep apnea even before he was obese. His obesity began in the 90s when he quit smoking ; otherwise negative  Physical Exam:  Blood pressure 138/92, pulse 72, temperature 98.5 F (36.9 C), temperature source Oral, resp. rate 16, height 6' (1.829 m), weight 311 lb 9.6 oz (141.341 kg).  Body mass index is 42.25 kg/(m^2).  Gen: WDWN white male NAD  Neurological: Alert and oriented to person, place, and time. Motor and sensory function is grossly intact  Head: Normocephalic and atraumatic.  Eyes: Conjunctivae are normal. Pupils are equal, round, and reactive to light. No scleral icterus.  Neck: Normal range of motion. Neck supple. No tracheal deviation or thyromegaly present.  Cardiovascular: SR without murmurs or gallops. No carotid bruits  Breast: Not examined  Respiratory: Effort normal. No respiratory distress. No chest wall tenderness. Breath sounds normal. No wheezes, rales or rhonchi.   Abdomen: Protuberant with a mild diastases recti  GU: Not examined  Musculoskeletal: Normal range of motion. Extremities are nontender. No cyanosis, edema or clubbing noted Lymphadenopathy: No cervical, preauricular, postauricular or axillary adenopathy is present Skin: Skin is warm and dry. No rash noted. No diaphoresis. No erythema. No pallor. Pscyh: Normal mood and affect. Behavior is normal. Judgment and thought content normal.  LABORATORY RESULTS:  No results found for this or any previous visit (from the past 48 hour(s)).  RADIOLOGY RESULTS:  No results found.  Problem List:  Patient Active Problem List    Diagnosis  Date Noted   .  DM type 2, uncontrolled, with renal complications  54/36/0677   .  Hearing loss-aides  03/26/2012   .  Back pain  12/26/2011   .  OSA (obstructive sleep apnea)  12/26/2011   .  Cervical disc disease  12/26/2011   .  Hypertension  08/29/2011   .  Hyperlipidemia  08/29/2011   .  BMI 40.0-44.9, adult  08/29/2011   Assessment & Plan:  Morbid obesity type 2 diabetes and metabolic syndrome. Patient would like to pursue laparoscopic adjustable gastric banding. Will move toward that end.  Matt B. Hassell Done, MD, [facility]  [phone] beeper  [phone]

## 2014-03-26 ENCOUNTER — Encounter (HOSPITAL_COMMUNITY): Payer: Self-pay

## 2014-03-26 ENCOUNTER — Encounter (HOSPITAL_COMMUNITY)
Admission: RE | Admit: 2014-03-26 | Discharge: 2014-03-26 | Disposition: A | Discharge status: Home / Self Care | Payer: BC Managed Care – PPO | Source: Ambulatory Visit | Attending: Surgery | Admitting: Surgery

## 2014-03-26 ENCOUNTER — Ambulatory Visit (HOSPITAL_COMMUNITY)
Admission: RE | Admit: 2014-03-26 | Discharge: 2014-03-26 | Disposition: A | Discharge status: Home / Self Care | Payer: BC Managed Care – PPO | Source: Ambulatory Visit | Attending: Anesthesiology | Admitting: Anesthesiology

## 2014-03-26 DIAGNOSIS — E669 Obesity, unspecified: Secondary | ICD-10-CM | POA: Diagnosis not present

## 2014-03-26 DIAGNOSIS — Z6841 Body Mass Index (BMI) 40.0 and over, adult: Secondary | ICD-10-CM | POA: Insufficient documentation

## 2014-03-26 DIAGNOSIS — I10 Essential (primary) hypertension: Secondary | ICD-10-CM

## 2014-03-26 DIAGNOSIS — E119 Type 2 diabetes mellitus without complications: Secondary | ICD-10-CM | POA: Diagnosis not present

## 2014-03-26 DIAGNOSIS — Z01818 Encounter for other preprocedural examination: Secondary | ICD-10-CM | POA: Insufficient documentation

## 2014-03-26 DIAGNOSIS — Z87891 Personal history of nicotine dependence: Secondary | ICD-10-CM | POA: Insufficient documentation

## 2014-03-26 HISTORY — DX: Gastro-esophageal reflux disease without esophagitis: K21.9

## 2014-03-26 HISTORY — DX: Shortness of breath: R06.02

## 2014-03-26 HISTORY — DX: Family history of other specified conditions: Z84.89

## 2014-03-26 HISTORY — DX: Personal history of other diseases of the digestive system: Z87.19

## 2014-03-26 HISTORY — DX: Other seasonal allergic rhinitis: J30.2

## 2014-03-26 HISTORY — DX: Unspecified osteoarthritis, unspecified site: M19.90

## 2014-03-26 LAB — COMPREHENSIVE METABOLIC PANEL
ALT: 38 U/L (ref 0–53)
AST: 37 U/L (ref 0–37)
Albumin: 3.8 g/dL (ref 3.5–5.2)
Alkaline Phosphatase: 49 U/L (ref 39–117)
Anion gap: 16 — ABNORMAL HIGH (ref 5–15)
BUN: 17 mg/dL (ref 6–23)
CO2: 28 mEq/L (ref 19–32)
Calcium: 9 mg/dL (ref 8.4–10.5)
Chloride: 93 mEq/L — ABNORMAL LOW (ref 96–112)
Creatinine, Ser: 1.17 mg/dL (ref 0.50–1.35)
GFR calc Af Amer: 77 mL/min — ABNORMAL LOW (ref >90)
GFR calc non Af Amer: 67 mL/min — ABNORMAL LOW (ref >90)
Glucose, Bld: 320 mg/dL — ABNORMAL HIGH (ref 70–99)
Potassium: 3.9 mEq/L (ref 3.7–5.3)
Sodium: 137 mEq/L (ref 137–147)
Total Bilirubin: 0.5 mg/dL (ref 0.3–1.2)
Total Protein: 6.9 g/dL (ref 6.0–8.3)

## 2014-03-26 LAB — CBC WITH DIFFERENTIAL/PLATELET
Basophils Absolute: 0 10*3/uL (ref 0.0–0.1)
Basophils Relative: 0 % (ref 0–1)
Eosinophils Absolute: 0.1 10*3/uL (ref 0.0–0.7)
Eosinophils Relative: 2 % (ref 0–5)
HCT: 43.4 % (ref 39.0–52.0)
Hemoglobin: 15.3 g/dL (ref 13.0–17.0)
Lymphocytes Relative: 26 % (ref 12–46)
Lymphs Abs: 1.5 10*3/uL (ref 0.7–4.0)
MCH: 32.6 pg (ref 26.0–34.0)
MCHC: 35.3 g/dL (ref 30.0–36.0)
MCV: 92.3 fL (ref 78.0–100.0)
Monocytes Absolute: 0.5 10*3/uL (ref 0.1–1.0)
Monocytes Relative: 9 % (ref 3–12)
Neutro Abs: 3.7 10*3/uL (ref 1.7–7.7)
Neutrophils Relative %: 63 % (ref 43–77)
Platelets: 174 10*3/uL (ref 150–400)
RBC: 4.7 MIL/uL (ref 4.22–5.81)
RDW: 12.4 % (ref 11.5–15.5)
WBC: 5.8 10*3/uL (ref 4.0–10.5)

## 2014-03-26 MED ORDER — LISINOPRIL-HYDROCHLOROTHIAZIDE 20-12.5 MG PO TABS: 1.0000 | ORAL_TABLET | ORAL | Status: DC

## 2014-03-26 MED ORDER — AMLODIPINE BESYLATE 5 MG PO TABS: 5.0000 mg | ORAL_TABLET | ORAL | Status: DC

## 2014-03-26 MED ORDER — SIMVASTATIN 20 MG PO TABS: 20.0000 mg | ORAL_TABLET | ORAL | Status: DC

## 2014-03-26 MED ORDER — METFORMIN HCL ER 500 MG PO TB24: 2000.0000 mg | ORAL_TABLET | Freq: Every day | ORAL | Status: DC

## 2014-03-26 MED ORDER — GLIPIZIDE ER 10 MG PO TB24: 10.0000 mg | ORAL_TABLET | Freq: Every day | ORAL | Status: DC

## 2014-03-26 NOTE — Patient Instructions (Addendum)
[place]  03/26/2014    Your procedure is scheduled on:     04/05/2014              Report to [facility] Main Entrance and follow signs to  [place] arrive at 12:00 PM.   Call this number if you have problems the morning of surgery [phone] or Presurgical Testing [phone].   Remember:  Do not eat food After Midnight but may take clear liquids till 0800 AM. EAT HEALTHY SNACK NIGHT PRIOR TO YOUR SURGERY.   For Living Will and/or Health Care Power Attorney Forms: please provide copy for your medical record, may bring AM of surgery (forms should be already notarized-we do not provide this service).   For Cpap use: bring mask and tubing only.     Take these medicines the morning of surgery with A SIP OF WATER: Amlodipine;Flonase;Claritin;Metoprolol                               You may not have any metal on your body including hair pins and piercings  Do not wear jewelry,lotions, powders, or deodorant.  Men may shave face and neck.               Do not bring valuables to the hospital. [place].  Contacts, dentures or bridgework may not be worn into surgery.  Leave suitcase in the car. After surgery it may be brought to your room.  For patients admitted to the hospital, checkout time is 11:00 AM the day of discharge.     ________________________________________________________________________  [facility] - Preparing for Surgery Before surgery, you can play an important role.  Because skin is not sterile, your skin needs to be as free of germs as possible.  You can reduce the number of germs on your skin by washing with CHG (chlorahexidine gluconate) soap before surgery.  CHG is an antiseptic cleaner which kills germs and bonds with the skin to continue killing germs even after washing. Please DO NOT use if you have an allergy to CHG or antibacterial soaps.  If your skin becomes reddened/irritated stop using the CHG  and inform your nurse when you arrive at Short Stay. Do not shave (including legs and underarms) for at least 48 hours prior to the first CHG shower.  You may shave your face/neck. Please follow these instructions carefully:  1.  Shower with CHG Soap the night before surgery and the  morning of Surgery.  2.  If you choose to wash your hair, wash your hair first as usual with your  normal  shampoo.  3.  After you shampoo, rinse your hair and body thoroughly to remove the  shampoo.                           4.  Use CHG as you would any other liquid soap.  You can apply chg directly  to the skin and wash                       Gently with a scrungie or clean washcloth.  5.  Apply the CHG Soap to your body ONLY FROM THE NECK DOWN.   Do not use on face/ open  Wound or open sores. Avoid contact with eyes, ears mouth and genitals (private parts).                       Wash face,  Genitals (private parts) with your normal soap.             6.  Wash thoroughly, paying special attention to the area where your surgery  will be performed.  7.  Thoroughly rinse your body with warm water from the neck down.  8.  DO NOT shower/wash with your normal soap after using and rinsing off  the CHG Soap.                9.  Pat yourself dry with a clean towel.            10.  Wear clean pajamas.            11.  Place clean sheets on your bed the night of your first shower and do not  sleep with pets. Day of Surgery : Do not apply any lotions/deodorants the morning of surgery.  Please wear clean clothes to the hospital/surgery center.  FAILURE TO FOLLOW THESE INSTRUCTIONS MAY RESULT IN THE CANCELLATION OF YOUR SURGERY PATIENT SIGNATURE_________________________________  NURSE SIGNATURE__________________________________  ________________________________________________________________________    CLEAR LIQUID DIET   Foods Allowed                                                                      Foods Excluded  Coffee and tea, regular and decaf                             liquids that you cannot  Plain Jell-O in any flavor                                             see through such as: Fruit ices (not with fruit pulp)                                     milk, soups, orange juice  Iced Popsicles                                    All solid food Carbonated beverages, regular and diet                                    Cranberry, grape and apple juices Sports drinks like Gatorade Lightly seasoned clear broth or consume(fat free) Sugar, honey syrup  Sample Menu Breakfast                                Lunch  Supper Cranberry juice                    Beef broth                            Chicken broth Jell-O                                     Grape juice                           Apple juice Coffee or tea                        Jell-O                                      Popsicle                                                Coffee or tea                        Coffee or tea  _____________________________________________________________________

## 2014-03-26 NOTE — Progress Notes (Signed)
EKG / epic 02/10/2014

## 2014-03-26 NOTE — Telephone Encounter (Signed)
Two tablets daily Lisinopril HCTZ 20/12.5mg  LUP (Zestoretic) blood pressure-ACE inhibitor with diuretic added) (360 tablets)  One tablet Metoprolol 100MG  CAR (Lopressor) (180 tablets)  One tablet daily Simvastatin (Zocor) 20mg  NOR (180 tablets)  One tablet Amlodipine Besylate (ZY) (Norvasc) 5 mg added March 2012 (180 tablets)  One tablet Glipizide ER 10mg  (180 tablets)  Four tablets Metformin 500mg . HCL ER Tablet (720 tablets)

## 2014-03-26 NOTE — Progress Notes (Signed)
CMP results per PAT visit on 03/26/2014 / epic sent to Dr Kaylyn Lim

## 2014-03-26 NOTE — Telephone Encounter (Signed)
Sent in #90 for medications requested. Pt has an appt on 10/28

## 2014-03-26 NOTE — Progress Notes (Signed)
Cardiac Cath notes from 11/17/2009 on chart

## 2014-03-29 ENCOUNTER — Encounter: Payer: Self-pay | Admitting: Internal Medicine

## 2014-03-29 ENCOUNTER — Ambulatory Visit (INDEPENDENT_AMBULATORY_CARE_PROVIDER_SITE_OTHER): Payer: BC Managed Care – PPO | Admitting: Internal Medicine

## 2014-03-29 ENCOUNTER — Ambulatory Visit: Payer: BC Managed Care – PPO

## 2014-03-29 VITALS — BP 128/88 | HR 83 | Temp 98.1°F | Resp 12 | Wt 313.0 lb

## 2014-03-29 DIAGNOSIS — E1129 Type 2 diabetes mellitus with other diabetic kidney complication: Secondary | ICD-10-CM

## 2014-03-29 DIAGNOSIS — E1165 Type 2 diabetes mellitus with hyperglycemia: Secondary | ICD-10-CM

## 2014-03-29 DIAGNOSIS — IMO0002 Reserved for concepts with insufficient information to code with codable children: Secondary | ICD-10-CM

## 2014-03-29 NOTE — Progress Notes (Signed)
Patient ID: [name], male   DOB: December 31, 1955, 58 y.o.   MRN: [id number]  HPI: [name] is a 58 y.o.-year-old male, returning for f/u for DM2, dx 2000, non-insulin-dependent, uncontrolled, with complications (PN, CKD). Last visit 3 mo ago.  He will have lap band GBP on 04/05/2014.   Last hemoglobin A1c was: Lab Results  Component Value Date   HGBA1C 8.0 01/13/2014   HGBA1C 9.5 10/21/2013   HGBA1C 9.9 09/09/2013   Pt is on a regimen of: - Metformin XR 1000 mg po bid - Glipizide XL 10 mg daily We tried Victoza >> nausea, then HA We tried Invokana >> not approved by [employer]. He tried Januvia >> did not "work" (not approved by [employer] - he is a Insurance underwriter)  Pt checks his sugars once every 1x every 2-3 days a day and they are higher: - am: 225-350 >> 142-175 (297x1) >> 170-180 >> 180-250 - 2h after b'fast: n/c >> 188 >> 170-180 >> 200-230 - before lunch: 245 >> 165 >> n/c - 2h after lunch: n/c >> 147, 182 >> n/c - before dinner: n/c >> 88-146 >> 125-160 >> n/c - 2h after dinner: n/c >> 85, 189 >> n/c - bedtime: 280-300 >> 75, 144-192 >> n/c - nighttime: n/c No lows; he has hypoglycemia awareness at 70.  Highest sugar was 250s  He is thinking about the lab band recently. He will meet with Dr Hassell Done to discuss about it.  Pt's meals were very high calorie/high fat - now on the diet for GBP: - Breakfast: 2 fried eggs + bacon >> now protein shake - Lunch: wrap or [place] sandwich or steak or BBQ + baked potato or mushrooms from [employer]; [employer], [place] (tuna with mayo) >> veggies and shakes - Dinner: 3-5 glasses of wine!; chicken or fish + steamed veggies + olives; pizza >> smaller portions - Snacks: no  No exercise.  - + CKD, last BUN/creatinine:  Lab Results  Component Value Date   BUN 17 03/26/2014   CREATININE 1.17 03/26/2014  On Lisinopril. - last set of lipids: Lab Results  Component Value Date   CHOL 189 01/13/2014   HDL 37* 01/13/2014   LDLCALC 76 01/13/2014   TRIG  378* 01/13/2014   CHOLHDL 5.1 01/13/2014  On Zocor. - last eye exam was in ~09/2012. No DR.  - no numbness and tingling in his feet. He has mild chane in sensation on his soles. He had a normal foot exam in PCPs office on 10/21/2013.  He also has a history of OSA - compliant 100% with CPAP. He had a heart catheterization: 2-3 years ago >> normal.  I reviewed pt's medications, allergies, PMH, social hx, family hx and no changes required, except as mentioned above.  ROS: Constitutional: no weight gain/loss, + fatigue, + subjective hyperthermia Eyes: no blurry vision, no xerophthalmia ENT: no sore throat, no nodules palpated in throat, no dysphagia/odynophagia, no hoarseness, + tinnitus Cardiovascular: no CP/SOB/palpitations/leg swelling Respiratory: no cough/SOB Gastrointestinal: + N/no V/D/C Musculoskeletal: no muscle/joint aches: back pain, join pain Skin: no rashes Neurological: no tremors/numbness/tingling/dizziness, + HA  PE: BP 128/88  Pulse 83  Temp(Src) 98.1 F (36.7 C) (Oral)  Resp 12  Wt 313 lb (141.976 kg)  SpO2 95% Body mass index is 43.67 kg/(m^2).  Wt Readings from Last 3 Encounters:  03/29/14 313 lb (141.976 kg)  03/26/14 312 lb 6 oz (141.692 kg)  03/22/14 312 lb 8 oz (141.749 kg)   Constitutional: obese, in  NAD Eyes: PERRLA, EOMI, no exophthalmos ENT: moist mucous membranes, no thyromegaly, no cervical lymphadenopathy Cardiovascular: RRR, No MRG Respiratory: CTA B Gastrointestinal: abdomen soft, NT, ND, BS+ Musculoskeletal: no deformities, strength intact in all 4 Skin: moist, warm, no rashes Neurological: no tremor with outstretched hands, DTR normal in all 4  ASSESSMENT: 1. DM2, non-insulin-dependent, uncontrolled, with complications - PN - CKD  PLAN:  1. Patient with long-standing, uncontrolled diabetes, initially improved after adding Glipizide, now worsened again. He will have lap band surgery in 1 week >> we will not change his regimen. - I  suggested to:  Patient Instructions  Continue; - Metformin XR 1000 mg 2x a day - Glipizide XL 10 mg daily Please come back for a follow-up appointment in 1.5 months. - advised for yearly eye exams >> needs one - refuses flu vaccine - Return to clinic in 1.5 mo with sugar log

## 2014-03-29 NOTE — Patient Instructions (Signed)
Please continue the current regimen: - Metformin XR 1000 mg 2x a day - Glipizide XL 10 mg daily in am We may need to stop the Glipizide after the gastric bypass.  Please return in 1.5 month with your sugar log.

## 2014-03-30 ENCOUNTER — Other Ambulatory Visit: Payer: Self-pay | Admitting: Internal Medicine

## 2014-04-01 ENCOUNTER — Encounter: Payer: Self-pay | Admitting: Internal Medicine

## 2014-04-01 MED ORDER — METOPROLOL TARTRATE 100 MG PO TABS: 100.0000 mg | ORAL_TABLET | ORAL | Status: DC

## 2014-04-05 ENCOUNTER — Encounter (HOSPITAL_COMMUNITY)
Admission: RE | Disposition: A | Discharge status: Home / Self Care | Payer: Self-pay | Source: Ambulatory Visit | Attending: Surgery

## 2014-04-05 ENCOUNTER — Inpatient Hospital Stay (HOSPITAL_COMMUNITY): Payer: BC Managed Care – PPO | Admitting: Anesthesiology

## 2014-04-05 ENCOUNTER — Encounter (HOSPITAL_COMMUNITY): Payer: Self-pay | Admitting: *Deleted

## 2014-04-05 ENCOUNTER — Encounter (HOSPITAL_COMMUNITY): Payer: BC Managed Care – PPO | Admitting: Anesthesiology

## 2014-04-05 ENCOUNTER — Observation Stay (HOSPITAL_COMMUNITY)
Admission: RE | Admit: 2014-04-05 | Discharge: 2014-04-06 | Disposition: A | Discharge status: Home / Self Care | Payer: BC Managed Care – PPO | Source: Ambulatory Visit | Attending: Surgery | Admitting: Surgery

## 2014-04-05 DIAGNOSIS — Z87891 Personal history of nicotine dependence: Secondary | ICD-10-CM | POA: Diagnosis not present

## 2014-04-05 DIAGNOSIS — Z6841 Body Mass Index (BMI) 40.0 and over, adult: Secondary | ICD-10-CM | POA: Insufficient documentation

## 2014-04-05 DIAGNOSIS — E118 Type 2 diabetes mellitus with unspecified complications: Secondary | ICD-10-CM | POA: Diagnosis not present

## 2014-04-05 DIAGNOSIS — M5090 Cervical disc disorder, unspecified, unspecified cervical region: Secondary | ICD-10-CM | POA: Insufficient documentation

## 2014-04-05 DIAGNOSIS — E785 Hyperlipidemia, unspecified: Secondary | ICD-10-CM | POA: Insufficient documentation

## 2014-04-05 DIAGNOSIS — I10 Essential (primary) hypertension: Secondary | ICD-10-CM | POA: Diagnosis not present

## 2014-04-05 DIAGNOSIS — G4733 Obstructive sleep apnea (adult) (pediatric): Secondary | ICD-10-CM | POA: Diagnosis not present

## 2014-04-05 DIAGNOSIS — Z9884 Bariatric surgery status: Secondary | ICD-10-CM

## 2014-04-05 DIAGNOSIS — Z79899 Other long term (current) drug therapy: Secondary | ICD-10-CM | POA: Insufficient documentation

## 2014-04-05 DIAGNOSIS — K449 Diaphragmatic hernia without obstruction or gangrene: Secondary | ICD-10-CM | POA: Insufficient documentation

## 2014-04-05 DIAGNOSIS — H9193 Unspecified hearing loss, bilateral: Secondary | ICD-10-CM | POA: Diagnosis not present

## 2014-04-05 DIAGNOSIS — E8881 Metabolic syndrome: Secondary | ICD-10-CM | POA: Insufficient documentation

## 2014-04-05 DIAGNOSIS — T884XXA Failed or difficult intubation, initial encounter: Secondary | ICD-10-CM

## 2014-04-05 HISTORY — DX: Failed or difficult intubation, initial encounter: T88.4XXA

## 2014-04-05 HISTORY — DX: Bariatric surgery status: Z98.84

## 2014-04-05 HISTORY — PX: LAPAROSCOPIC GASTRIC BANDING: SHX1100

## 2014-04-05 LAB — CBC
HCT: 41.6 % (ref 39.0–52.0)
Hemoglobin: 14.7 g/dL (ref 13.0–17.0)
MCH: 32.5 pg (ref 26.0–34.0)
MCHC: 35.3 g/dL (ref 30.0–36.0)
MCV: 91.8 fL (ref 78.0–100.0)
Platelets: 168 10*3/uL (ref 150–400)
RBC: 4.53 MIL/uL (ref 4.22–5.81)
RDW: 12.4 % (ref 11.5–15.5)
WBC: 6.6 10*3/uL (ref 4.0–10.5)

## 2014-04-05 LAB — CREATININE, SERUM
Creatinine, Ser: 1.25 mg/dL (ref 0.50–1.35)
GFR calc Af Amer: 71 mL/min — ABNORMAL LOW (ref >90)
GFR calc non Af Amer: 61 mL/min — ABNORMAL LOW (ref >90)

## 2014-04-05 LAB — GLUCOSE, CAPILLARY
Glucose-Capillary: 174 mg/dL — ABNORMAL HIGH (ref 70–99)
Glucose-Capillary: 168 mg/dL — ABNORMAL HIGH (ref 70–99)
Glucose-Capillary: 196 mg/dL — ABNORMAL HIGH (ref 70–99)

## 2014-04-05 SURGERY — GASTRIC BANDING, LAPAROSCOPIC
Anesthesia: General

## 2014-04-05 MED ORDER — METOCLOPRAMIDE HCL 5 MG/ML IJ SOLN
INTRAMUSCULAR | Status: AC
  Filled 2014-04-05: qty 2

## 2014-04-05 MED ORDER — METOPROLOL TARTRATE 100 MG PO TABS
100.0000 mg | ORAL_TABLET | Freq: Every day | ORAL | Status: DC
  Filled 2014-04-05: qty 1

## 2014-04-05 MED ORDER — GLYCOPYRROLATE 0.2 MG/ML IJ SOLN
INTRAMUSCULAR | Status: DC | PRN
  Administered 2014-04-05: .8 mg via INTRAVENOUS

## 2014-04-05 MED ORDER — HEPARIN SODIUM (PORCINE) 5000 UNIT/ML IJ SOLN
5000.0000 [IU] | Freq: Three times a day (TID) | INTRAMUSCULAR | Status: DC
  Administered 2014-04-06: 5000 [IU] via SUBCUTANEOUS
  Filled 2014-04-05 (×4): qty 1

## 2014-04-05 MED ORDER — ACETAMINOPHEN 160 MG/5ML PO SOLN: 650.0000 mg | ORAL | Status: DC | PRN

## 2014-04-05 MED ORDER — ACETAMINOPHEN 160 MG/5ML PO SOLN: 325.0000 mg | ORAL | Status: DC | PRN

## 2014-04-05 MED ORDER — ONDANSETRON HCL 4 MG/2ML IJ SOLN
INTRAMUSCULAR | Status: AC
  Filled 2014-04-05: qty 2

## 2014-04-05 MED ORDER — UNJURY VANILLA POWDER: 2.0000 [oz_av] | Freq: Four times a day (QID) | ORAL | Status: DC

## 2014-04-05 MED ORDER — OXYCODONE HCL 5 MG/5ML PO SOLN: 5.0000 mg | ORAL | Status: DC | PRN

## 2014-04-05 MED ORDER — MIDAZOLAM HCL 2 MG/2ML IJ SOLN
INTRAMUSCULAR | Status: AC
  Filled 2014-04-05: qty 2

## 2014-04-05 MED ORDER — CHLORHEXIDINE GLUCONATE CLOTH 2 % EX PADS: 6.0000 | MEDICATED_PAD | Freq: Once | CUTANEOUS | Status: DC

## 2014-04-05 MED ORDER — 0.9 % SODIUM CHLORIDE (POUR BTL) OPTIME
TOPICAL | Status: DC | PRN
  Administered 2014-04-05: 1000 mL

## 2014-04-05 MED ORDER — UNJURY CHICKEN SOUP POWDER: 2.0000 [oz_av] | Freq: Four times a day (QID) | ORAL | Status: DC

## 2014-04-05 MED ORDER — ACETAMINOPHEN 10 MG/ML IV SOLN
1000.0000 mg | Freq: Once | INTRAVENOUS | Status: DC
  Filled 2014-04-05: qty 100

## 2014-04-05 MED ORDER — INSULIN ASPART 100 UNIT/ML ~~LOC~~ SOLN
0.0000 [IU] | SUBCUTANEOUS | Status: DC
  Administered 2014-04-05: 4 [IU] via SUBCUTANEOUS
  Administered 2014-04-06 (×3): 7 [IU] via SUBCUTANEOUS
  Administered 2014-04-06: 11 [IU] via SUBCUTANEOUS

## 2014-04-05 MED ORDER — NEOSTIGMINE METHYLSULFATE 10 MG/10ML IV SOLN
INTRAVENOUS | Status: DC | PRN
Start: 2014-04-05 — End: 2014-04-05
  Administered 2014-04-05: 5 mg via INTRAVENOUS

## 2014-04-05 MED ORDER — CISATRACURIUM BESYLATE (PF) 10 MG/5ML IV SOLN
INTRAVENOUS | Status: DC | PRN
  Administered 2014-04-05: 2 mg via INTRAVENOUS
  Administered 2014-04-05: 6 mg via INTRAVENOUS

## 2014-04-05 MED ORDER — ONDANSETRON HCL 4 MG/2ML IJ SOLN: 4.0000 mg | INTRAMUSCULAR | Status: DC | PRN

## 2014-04-05 MED ORDER — DEXTROSE 5 % IV SOLN
INTRAVENOUS | Status: AC
  Filled 2014-04-05: qty 2

## 2014-04-05 MED ORDER — LACTATED RINGERS IV SOLN
INTRAVENOUS | Status: DC
Start: 2014-04-05 — End: 2014-04-05

## 2014-04-05 MED ORDER — PROPOFOL 10 MG/ML IV BOLUS
INTRAVENOUS | Status: AC
  Filled 2014-04-05: qty 20

## 2014-04-05 MED ORDER — CISATRACURIUM BESYLATE 20 MG/10ML IV SOLN
INTRAVENOUS | Status: AC
  Filled 2014-04-05: qty 10

## 2014-04-05 MED ORDER — GLYCOPYRROLATE 0.2 MG/ML IJ SOLN
INTRAMUSCULAR | Status: AC
  Filled 2014-04-05: qty 4

## 2014-04-05 MED ORDER — SODIUM CHLORIDE 0.9 % IJ SOLN
INTRAMUSCULAR | Status: AC
  Filled 2014-04-05: qty 20

## 2014-04-05 MED ORDER — FENTANYL CITRATE 0.05 MG/ML IJ SOLN
INTRAMUSCULAR | Status: AC
  Filled 2014-04-05: qty 5

## 2014-04-05 MED ORDER — LIDOCAINE HCL (CARDIAC) 20 MG/ML IV SOLN
INTRAVENOUS | Status: DC | PRN
  Administered 2014-04-05: 60 mg via INTRAVENOUS

## 2014-04-05 MED ORDER — HYDROMORPHONE HCL 1 MG/ML IJ SOLN: 0.2500 mg | INTRAMUSCULAR | Status: DC | PRN

## 2014-04-05 MED ORDER — ACETAMINOPHEN 10 MG/ML IV SOLN
INTRAVENOUS | Status: DC | PRN
  Administered 2014-04-05: 1000 mg via INTRAVENOUS

## 2014-04-05 MED ORDER — HEPARIN SODIUM (PORCINE) 5000 UNIT/ML IJ SOLN
5000.0000 [IU] | INTRAMUSCULAR | Status: AC
  Administered 2014-04-05: 5000 [IU] via SUBCUTANEOUS
  Filled 2014-04-05: qty 1

## 2014-04-05 MED ORDER — LACTATED RINGERS IV SOLN
INTRAVENOUS | Status: DC
  Administered 2014-04-05: 1000 mL via INTRAVENOUS
  Administered 2014-04-05: 16:00:00 via INTRAVENOUS

## 2014-04-05 MED ORDER — AMLODIPINE BESYLATE 5 MG PO TABS
5.0000 mg | ORAL_TABLET | Freq: Every day | ORAL | Status: DC
  Administered 2014-04-06: 5 mg via ORAL
  Filled 2014-04-05: qty 1

## 2014-04-05 MED ORDER — SUCCINYLCHOLINE CHLORIDE 20 MG/ML IJ SOLN
INTRAMUSCULAR | Status: DC | PRN
  Administered 2014-04-05 (×2): 100 mg via INTRAVENOUS

## 2014-04-05 MED ORDER — NEOSTIGMINE METHYLSULFATE 10 MG/10ML IV SOLN
INTRAVENOUS | Status: AC
  Filled 2014-04-05: qty 1

## 2014-04-05 MED ORDER — UNJURY CHOCOLATE CLASSIC POWDER: 2.0000 [oz_av] | Freq: Four times a day (QID) | ORAL | Status: DC

## 2014-04-05 MED ORDER — SODIUM CHLORIDE 0.9 % IJ SOLN
INTRAMUSCULAR | Status: DC | PRN
  Administered 2014-04-05: 20 mL via INTRAVENOUS

## 2014-04-05 MED ORDER — MIDAZOLAM HCL 5 MG/5ML IJ SOLN
INTRAMUSCULAR | Status: DC | PRN
  Administered 2014-04-05: 2 mg via INTRAVENOUS

## 2014-04-05 MED ORDER — BUPIVACAINE LIPOSOME 1.3 % IJ SUSP
20.0000 mL | Freq: Once | INTRAMUSCULAR | Status: DC
  Filled 2014-04-05: qty 20

## 2014-04-05 MED ORDER — LACTATED RINGERS IR SOLN
Status: DC | PRN
  Administered 2014-04-05: 1000 mL

## 2014-04-05 MED ORDER — DEXAMETHASONE SODIUM PHOSPHATE 10 MG/ML IJ SOLN
INTRAMUSCULAR | Status: DC | PRN
  Administered 2014-04-05: 10 mg via INTRAVENOUS

## 2014-04-05 MED ORDER — FENTANYL CITRATE 0.05 MG/ML IJ SOLN
INTRAMUSCULAR | Status: DC | PRN
  Administered 2014-04-05: 100 ug via INTRAVENOUS
  Administered 2014-04-05 (×3): 50 ug via INTRAVENOUS

## 2014-04-05 MED ORDER — PROPOFOL 10 MG/ML IV BOLUS
INTRAVENOUS | Status: DC | PRN
  Administered 2014-04-05: 50 mg via INTRAVENOUS
  Administered 2014-04-05: 200 mg via INTRAVENOUS
  Administered 2014-04-05: 50 mg via INTRAVENOUS

## 2014-04-05 MED ORDER — ONDANSETRON HCL 4 MG/2ML IJ SOLN
INTRAMUSCULAR | Status: DC | PRN
  Administered 2014-04-05: 4 mg via INTRAVENOUS

## 2014-04-05 MED ORDER — DEXTROSE 5 % IV SOLN
2.0000 g | INTRAVENOUS | Status: AC
  Administered 2014-04-05: 2 g via INTRAVENOUS

## 2014-04-05 MED ORDER — MORPHINE SULFATE 2 MG/ML IJ SOLN: 2.0000 mg | INTRAMUSCULAR | Status: DC | PRN

## 2014-04-05 MED ORDER — LIDOCAINE HCL (CARDIAC) 20 MG/ML IV SOLN
INTRAVENOUS | Status: AC
  Filled 2014-04-05: qty 5

## 2014-04-05 MED ORDER — BUPIVACAINE LIPOSOME 1.3 % IJ SUSP
INTRAMUSCULAR | Status: DC | PRN
  Administered 2014-04-05: 20 mL

## 2014-04-05 MED ORDER — KCL IN DEXTROSE-NACL 20-5-0.45 MEQ/L-%-% IV SOLN
INTRAVENOUS | Status: DC
  Administered 2014-04-05 – 2014-04-06 (×2): via INTRAVENOUS
  Filled 2014-04-05 (×3): qty 1000

## 2014-04-05 SURGICAL SUPPLY — 56 items
ADH SKN CLS APL DERMABOND .7 (GAUZE/BANDAGES/DRESSINGS) ×1
APL SKNCLS STERI-STRIP NONHPOA (GAUZE/BANDAGES/DRESSINGS)
BAND LAP 10.0 W/TUBES (Band) ×2 IMPLANT
BENZOIN TINCTURE PRP APPL 2/3 (GAUZE/BANDAGES/DRESSINGS) IMPLANT
BLADE SURG 15 STRL LF DISP TIS (BLADE) ×1 IMPLANT
BLADE SURG 15 STRL SS (BLADE) ×2
DECANTER SPIKE VIAL GLASS SM (MISCELLANEOUS) ×2 IMPLANT
DERMABOND ADVANCED (GAUZE/BANDAGES/DRESSINGS) ×1
DERMABOND ADVANCED .7 DNX12 (GAUZE/BANDAGES/DRESSINGS) ×1 IMPLANT
DEVICE SUT QUICK LOAD TK 5 (STAPLE) ×7 IMPLANT
DEVICE SUT TI-KNOT TK 5X26 (MISCELLANEOUS) ×2 IMPLANT
DEVICE SUTURE ENDOST 10MM (ENDOMECHANICALS) IMPLANT
DISSECTOR BLUNT TIP ENDO 5MM (MISCELLANEOUS) ×1 IMPLANT
DRAPE CAMERA CLOSED 9X96 (DRAPES) ×2 IMPLANT
DRAPE UNIVERSAL PACK (DRAPES) ×2 IMPLANT
ELECT REM PT RETURN 9FT ADLT (ELECTROSURGICAL) ×2
ELECTRODE REM PT RTRN 9FT ADLT (ELECTROSURGICAL) ×1 IMPLANT
GAUZE SPONGE 4X4 12PLY STRL (GAUZE/BANDAGES/DRESSINGS) IMPLANT
GLOVE BIOGEL M 8.0 STRL (GLOVE) ×2 IMPLANT
GOWN SPEC L4 XLG W/TWL (GOWN DISPOSABLE) ×1 IMPLANT
GOWN STRL REUS W/TWL XL LVL3 (GOWN DISPOSABLE) ×8 IMPLANT
HOVERMATT SINGLE USE (MISCELLANEOUS) ×2 IMPLANT
KIT BASIN OR (CUSTOM PROCEDURE TRAY) ×2 IMPLANT
MESH HERNIA 1X4 RECT BARD (Mesh General) ×1 IMPLANT
MESH HERNIA BARD 1X4 (Mesh General) ×1 IMPLANT
NDL SPNL 22GX3.5 QUINCKE BK (NEEDLE) ×1 IMPLANT
NEEDLE SPNL 22GX3.5 QUINCKE BK (NEEDLE) ×2 IMPLANT
SCISSORS LAP 5X45 EPIX DISP (ENDOMECHANICALS) ×1 IMPLANT
SET IRRIG TUBING LAPAROSCOPIC (IRRIGATION / IRRIGATOR) ×1 IMPLANT
SHEARS CURVED HARMONIC AC 45CM (MISCELLANEOUS) IMPLANT
SLEEVE ADV FIXATION 5X100MM (TROCAR) ×2 IMPLANT
SOLUTION ANTI FOG 6CC (MISCELLANEOUS) ×2 IMPLANT
SPONGE LAP 18X18 X RAY DECT (DISPOSABLE) ×2 IMPLANT
STAPLER VISISTAT 35W (STAPLE) ×2 IMPLANT
STRIP CLOSURE SKIN 1/2X4 (GAUZE/BANDAGES/DRESSINGS) IMPLANT
SUT DEVICE BRAIDED 0X39 (SUTURE) ×1 IMPLANT
SUT DEVICE BRAIDED 2.0X39 (SUTURE) ×6 IMPLANT
SUT ETHIBOND 2 0 SH (SUTURE) ×6
SUT ETHIBOND 2 0 SH 36X2 (SUTURE) ×3 IMPLANT
SUT PROLENE 2 0 CT2 30 (SUTURE) ×2 IMPLANT
SUT SILK 0 (SUTURE) ×2
SUT SILK 0 30XBRD TIE 6 (SUTURE) ×1 IMPLANT
SUT SURGIDAC NAB ES-9 0 48 120 (SUTURE) IMPLANT
SUT VIC AB 2-0 SH 27 (SUTURE)
SUT VIC AB 2-0 SH 27X BRD (SUTURE) IMPLANT
SUT VIC AB 4-0 SH 18 (SUTURE) ×4 IMPLANT
SYR 20CC LL (SYRINGE) ×4 IMPLANT
TOWEL OR 17X26 10 PK STRL BLUE (TOWEL DISPOSABLE) ×4 IMPLANT
TOWEL OR NON WOVEN STRL DISP B (DISPOSABLE) ×2 IMPLANT
TROCAR ADV FIXATION 12X100MM (TROCAR) ×2 IMPLANT
TROCAR BLADELESS 15MM (ENDOMECHANICALS) ×2 IMPLANT
TROCAR BLADELESS OPT 5 100 (ENDOMECHANICALS) ×2 IMPLANT
TROCAR XCEL NON-BLD 11X100MML (ENDOMECHANICALS) IMPLANT
TROCAR XCEL UNIV SLVE 11M 100M (ENDOMECHANICALS) IMPLANT
TUBE CALIBRATION LAPBAND (TUBING) ×2 IMPLANT
TUBING INSUFFLATION 10FT LAP (TUBING) ×2 IMPLANT

## 2014-04-05 NOTE — Op Note (Signed)
04/05/2014  Surgeon: Kaylyn Lim, MD, FACS Asst:  Greer Pickerel, MD,FACS  Procedure: Laparoscopic adjustable gastric banding with APS system and 1 suture closure of the hiatus posteriorally  Anes:  General  EBL:  Minimal  Description of Procedure  The patient was taken to OR # 1 and given general anesthesia.  After a prep with PCMX the patient was draped and a timeout performed.  Access to the abdomen was achieved with a 0 degree Optiview technique through the left upper quadrant.    Adhesions were minimal.  Ports were placed to the the right of the midline including a 15 trocar in  the right upper quadrant placed obliquely.  The Sherrie Sport was used to retract the left lateral segment and the peritoneum was incised along the left crus.   The EJ junction as assessed for a hiatus hernia and a dimple was seen.  A balloon test was postitive for hernia with 10cc air in balloon. I dissected the hiatus posteriorally then placed a single stitch with 0 surgidek using the Endo 360 secured with a Ty-knot.     The pars flaccida technique was utilized to insert the blunt "finger " dissector from right to left behind the stomach.  This created a target zone to pass the band passer.     The lapband APS  Had been previously flushed and was inserted through the 15 trocar.  It was placed in the tip of "the finger"  and pulled around behind the stomach.   The band was plicated with 3 sutures of 2-0 surgidek using the Endo 360.  The tubing was brought out through the lower incision on the right and connected to the port which had mesh sewn onto the back and was placed into the subcutaneous pocket.  The incisions were injected with Exparel and closed with 4-0 vicryl and Dermabond.     The patient was taken to the PACU in stable condition.    Matt B. Hassell Done, [place], [facility], [place]

## 2014-04-05 NOTE — Anesthesia Preprocedure Evaluation (Signed)
Anesthesia Evaluation  Patient identified by MRN, date of birth, ID band Patient awake    Reviewed: Allergy & Precautions, H&P , NPO status , Patient's Chart, lab work & pertinent test results, reviewed documented beta blocker date and time   Airway Mallampati: II TM Distance: >3 FB Neck ROM: full    Dental no notable dental hx. (+) Teeth Intact, Dental Advisory Given   Pulmonary shortness of breath and with exertion, sleep apnea , former smoker,  breath sounds clear to auscultation  Pulmonary exam normal       Cardiovascular Exercise Tolerance: Good hypertension, Pt. on home beta blockers and Pt. on medications negative cardio ROS  Rhythm:regular Rate:Normal     Neuro/Psych negative neurological ROS  negative psych ROS   GI/Hepatic negative GI ROS, Neg liver ROS,   Endo/Other  diabetes, Poorly Controlled, Type 2, Oral Hypoglycemic AgentsMorbid obesity  Renal/GU negative Renal ROSRenal issues with diabetes but labs are normal  negative genitourinary   Musculoskeletal   Abdominal   Peds  Hematology negative hematology ROS (+)   Anesthesia Other Findings   Reproductive/Obstetrics negative OB ROS                           Anesthesia Physical Anesthesia Plan  ASA: III  Anesthesia Plan: General   Post-op Pain Management:    Induction: Intravenous  Airway Management Planned: Oral ETT  Additional Equipment:   Intra-op Plan:   Post-operative Plan: Extubation in OR  Informed Consent: I have reviewed the patients History and Physical, chart, labs and discussed the procedure including the risks, benefits and alternatives for the proposed anesthesia with the patient or authorized representative who has indicated his/her understanding and acceptance.   Dental Advisory Given  Plan Discussed with: CRNA and Surgeon  Anesthesia Plan Comments:         Anesthesia Quick Evaluation

## 2014-04-05 NOTE — Transfer of Care (Signed)
Immediate Anesthesia Transfer of Care Note  Patient: [name]  Procedure(s) Performed: Procedure(s): LAPAROSCOPIC GASTRIC BANDING (N/A)  Patient Location: PACU  Anesthesia Type:General  Level of Consciousness: awake  Airway & Oxygen Therapy: Patient Spontanous Breathing and Patient connected to face mask oxygen  Post-op Assessment: Report given to PACU RN and Post -op Vital signs reviewed and stable  Post vital signs: Reviewed and stable  Complications: No apparent anesthesia complications

## 2014-04-05 NOTE — H&P (Signed)
Chief Complaint: Morbid obesity BMI 42 with type 2 diabetes  History of Present Illness: [name] is an 58 y.o. male and comes in with his wife to discuss bariatric surgery. He is attended are all  Seminar and is decided that he would like have a laparoscopic adjustable gastric band. Being a type II diabetic I talked to him about the data supporting gastric bypass for type 2 diabetes. Looking at his overall constellation of findings he has metabolic syndrome. Think the lap band would work which is to may be a more difficult achieved that success and has emphasized would require followup.. He has obstructive sleep apnea so we will need to repeat that. We will go ahead and move forward with the other test for journey to lead [place] toward laparoscopic adjustable gastric banding.  Past Medical History   Diagnosis  Date   .  Diabetes mellitus    .  Hypertension    .  Rotator cuff tear    .  Dyslipidemia    .  Morbid obesity    .  Hyperlipidemia    .  Sleep apnea    .  OSA (obstructive sleep apnea)     Past Surgical History   Procedure  Laterality  Date   .  Knee surgery     .  Vasectomy      Current Outpatient Prescriptions   Medication  Sig  Dispense  Refill   .  amLODipine (NORVASC) 5 MG tablet  Take 1 tablet (5 mg total) by mouth daily.  180 tablet  0   .  aspirin 81 MG tablet  Take 81 mg by mouth daily.     Marland Kitchen  glipiZIDE (GLIPIZIDE XL) 10 MG 24 hr tablet  Take 1 tablet (10 mg total) by mouth daily with breakfast.  180 tablet  1   .  Lactobacillus (DIGESTIVE HEALTH PROBIOTIC PO)  Take 1 capsule by mouth 1 day or 1 dose.     Marland Kitchen  lisinopril-hydrochlorothiazide (PRINZIDE,ZESTORETIC) 20-12.5 MG per tablet  Take 2 tablets by mouth daily.  360 tablet  0   .  loratadine (CLARITIN) 10 MG tablet  Take 10 mg by mouth daily.     .  magnesium 30 MG tablet  Take 30 mg by mouth daily.     .  metFORMIN (GLUCOPHAGE-XR) 500 MG 24 hr tablet  Take 4 tablets (2,000 mg total) by mouth daily with breakfast.   720 tablet  0   .  metoprolol (LOPRESSOR) 100 MG tablet  Take 1 tablet (100 mg total) by mouth daily.  180 tablet  0   .  Multiple Vitamin (MULTIVITAMIN) tablet  Take 1 tablet by mouth daily.     Marland Kitchen  PRESCRIPTION MEDICATION  Glipizide ER 10 mg taking started this am     .  simvastatin (ZOCOR) 20 MG tablet  TAKE 1 TABLET BY MOUTH EVERY EVENING.  180 tablet  0   .  vitamin C (ASCORBIC ACID) 500 MG tablet  Take 1,000 mg by mouth daily.     Marland Kitchen  azithromycin (ZITHROMAX) 250 MG tablet  As packaged  6 tablet  1    No current facility-administered medications for this visit.   Erythromycin and Penicillins  History reviewed. No pertinent family history.  Social History: reports that he quit smoking about 22 years ago. His smoking use included Cigarettes. He started smoking about 40 years ago. He has a 36 pack-year smoking history. He does not have any  smokeless tobacco history on file. He reports that he drinks alcohol. He reports that he does not use illicit drugs.  REVIEW OF SYSTEMS :  Positive for obstructive sleep apnea even before he was obese. His obesity began in the 90s when he quit smoking ; otherwise negative  Physical Exam:  Blood pressure 138/92, pulse 72, temperature 98.5 F (36.9 C), temperature source Oral, resp. rate 16, height 6' (1.829 m), weight 311 lb 9.6 oz (141.341 kg).  Body mass index is 42.25 kg/(m^2).  Gen: WDWN white male NAD  Neurological: Alert and oriented to person, place, and time. Motor and sensory function is grossly intact  Head: Normocephalic and atraumatic.  Eyes: Conjunctivae are normal. Pupils are equal, round, and reactive to light. No scleral icterus.  Neck: Normal range of motion. Neck supple. No tracheal deviation or thyromegaly present.  Cardiovascular: SR without murmurs or gallops. No carotid bruits  Breast: Not examined  Respiratory: Effort normal. No respiratory distress. No chest wall tenderness. Breath sounds normal. No wheezes, rales or rhonchi.   Abdomen: Protuberant with a mild diastases recti  GU: Not examined  Musculoskeletal: Normal range of motion. Extremities are nontender. No cyanosis, edema or clubbing noted Lymphadenopathy: No cervical, preauricular, postauricular or axillary adenopathy is present Skin: Skin is warm and dry. No rash noted. No diaphoresis. No erythema. No pallor. Pscyh: Normal mood and affect. Behavior is normal. Judgment and thought content normal.  LABORATORY RESULTS:  No results found for this or any previous visit (from the past 48 hour(s)).  RADIOLOGY RESULTS:  No results found.  Problem List:  Patient Active Problem List    Diagnosis  Date Noted   .  DM type 2, uncontrolled, with renal complications  68/37/2902   .  Hearing loss-aides  03/26/2012   .  Back pain  12/26/2011   .  OSA (obstructive sleep apnea)  12/26/2011   .  Cervical disc disease  12/26/2011   .  Hypertension  08/29/2011   .  Hyperlipidemia  08/29/2011   .  BMI 40.0-44.9, adult  08/29/2011   Assessment & Plan:  Morbid obesity type 2 diabetes and metabolic syndrome. Patient would like to pursue laparoscopic adjustable gastric banding.  Matt B. Hassell Done, MD, [facility]  [phone] beeper  [phone]

## 2014-04-05 NOTE — Anesthesia Postprocedure Evaluation (Signed)
  Anesthesia Post-op Note  Patient: [name]  Procedure(s) Performed: Procedure(s) (LRB): LAPAROSCOPIC GASTRIC BANDING (N/A)  Patient Location: PACU  Anesthesia Type: General  Level of Consciousness: awake and alert   Airway and Oxygen Therapy: Patient Spontanous Breathing  Post-op Pain: mild  Post-op Assessment: Post-op Vital signs reviewed, Patient's Cardiovascular Status Stable, Respiratory Function Stable, Patent Airway and No signs of Nausea or vomiting  Last Vitals:  Filed Vitals:   04/05/14 1900  BP: 137/81  Pulse: 75  Temp: 36.7 C  Resp: 18    Post-op Vital Signs: stable   Complications: No apparent anesthesia complications

## 2014-04-05 NOTE — Progress Notes (Signed)
Pt placed on CPAP QHS on our machine with his tubing and nasal pillows.  Machine plugged into red outlet and humidification chamber filled.

## 2014-04-05 NOTE — Anesthesia Procedure Notes (Signed)
Procedure Name: Intubation Date/Time: 04/05/2014 4:11 PM Performed by: Sharlette Dense Pre-anesthesia Checklist: Patient identified, Emergency Drugs available, Suction available and Patient being monitored Patient Re-evaluated:Patient Re-evaluated prior to inductionOxygen Delivery Method: Circle system utilized Preoxygenation: Pre-oxygenation with 100% oxygen Intubation Type: IV induction Ventilation: Two handed mask ventilation required, Mask ventilation with difficulty and Oral airway inserted - appropriate to patient size Grade View: Grade I Tube type: Oral Tube size: 8.0 mm Number of attempts: 4 Airway Equipment and Method: Video-laryngoscopy Placement Confirmation: positive ETCO2,  breath sounds checked- equal and bilateral and ETT inserted through vocal cords under direct vision Secured at: 23 cm Tube secured with: Tape Dental Injury: Teeth and Oropharynx as per pre-operative assessment  Difficulty Due To: Difficulty was anticipated and Difficult Airway- due to reduced neck mobility Future Recommendations: Recommend- induction with short-acting agent, and alternative techniques readily available Comments: Smooth IV induction. Two handed mask with 9 cm OA. DL x 1 with Mac 4 by SRNA grade 3 view. DL x 1 by MDA, with Mac 4 grade 3 view. Attempt to pass ETT with bougie, unable to pass ETT. DL x 1 with Miller 3, grade 3 view. VL x 1 with glidescope #4 by MDA, Grade 1 view. Successful intubation. Atraumatic oral intubation with 8.0 ETT. Secured at 23 cm. =BBS and +ETCO2.

## 2014-04-05 NOTE — Interval H&P Note (Signed)
History and Physical Interval Note:  04/05/2014 2:34 PM  [name]  has presented today for surgery, with the diagnosis of Morbid Obesity  The various methods of treatment have been discussed with the patient and family. After consideration of risks, benefits and other options for treatment, the patient has consented to  Procedure(s): LAPAROSCOPIC GASTRIC BANDING (N/A) as a surgical intervention .  The patient's history has been reviewed, patient examined, no change in status, stable for surgery.  I have reviewed the patient's chart and labs.  Questions were answered to the patient's satisfaction.     [name]

## 2014-04-06 ENCOUNTER — Observation Stay (HOSPITAL_COMMUNITY): Payer: BC Managed Care – PPO

## 2014-04-06 ENCOUNTER — Encounter (HOSPITAL_COMMUNITY): Payer: Self-pay | Admitting: Surgery

## 2014-04-06 LAB — GLUCOSE, CAPILLARY
Glucose-Capillary: 213 mg/dL — ABNORMAL HIGH (ref 70–99)
Glucose-Capillary: 228 mg/dL — ABNORMAL HIGH (ref 70–99)
Glucose-Capillary: 238 mg/dL — ABNORMAL HIGH (ref 70–99)
Glucose-Capillary: 259 mg/dL — ABNORMAL HIGH (ref 70–99)

## 2014-04-06 LAB — HEMOGLOBIN A1C
Hgb A1c MFr Bld: 9.1 % — ABNORMAL HIGH (ref <5.7)
Mean Plasma Glucose: 214 mg/dL — ABNORMAL HIGH (ref <117)

## 2014-04-06 MED ORDER — IOHEXOL 300 MG/ML  SOLN
20.0000 mL | Freq: Once | INTRAMUSCULAR | Status: AC | PRN
  Administered 2014-04-06: 20 mL via ORAL

## 2014-04-06 NOTE — Progress Notes (Signed)
Patient alert and oriented, Post op day 1.  Provided support and encouragement.  Encouraged pulmonary toilet, ambulation and small sips of liquids.  All questions answered.  Will continue to monitor. 

## 2014-04-06 NOTE — Progress Notes (Signed)
Patient is alert and oriented.  Pain is controlled, and patient is tolerating fluids.  Advanced to protein shake today, patient tolerated well.  Reviewed Adjustable gastric band discharge instructions with patient, patient able to articulate understanding.  Provided information on BELT program, Support Group and [facility] outpatient pharmacy. All questions answered, will continue to monitor.  

## 2014-04-06 NOTE — Plan of Care (Signed)
Problem: Food- and Nutrition-Related Knowledge Deficit (NB-1.1) Goal: Nutrition education Formal process to instruct or train a patient/client in a skill or to impart knowledge to help patients/clients voluntarily manage or modify food choices and eating behavior to maintain or improve health. Outcome: Completed/Met Date Met:  04/06/14 Nutrition Education Note  Received consult for diet education per DROP protocol.  S/P Lap-band surgery  Discussed 2 week post op diet with pt. Emphasized that liquids must be non carbonated, non caffeinated, and sugar free. Fluid goals discussed. Pt to follow up with outpatient bariatric RD for further diet progression after 2 weeks. Multivitamins and minerals also reviewed. Teach back method used, pt expressed understanding, expect good compliance.   Diet: First 2 Weeks  You will see the nutritionist about two (2) weeks after your surgery. The nutritionist will increase the types of foods you can eat if you are handling liquids well:  If you have severe vomiting or nausea and cannot handle clear liquids lasting longer than 1 day, call your surgeon  Protein Shake  Drink at least 2 ounces of shake 5-6 times per day  Each serving of protein shakes (usually 8 - 12 ounces) should have a minimum of:  15 grams of protein  And no more than 5 grams of carbohydrate  Goal for protein each day:  Men = 80 grams per day  Women = 60 grams per day  Protein powder may be added to fluids such as non-fat milk or Lactaid milk or Soy milk (limit to 35 grams added protein powder per serving)   Hydration  Slowly increase the amount of water and other clear liquids as tolerated (See Acceptable Fluids)  Slowly increase the amount of protein shake as tolerated  Sip fluids slowly and throughout the day  May use sugar substitutes in small amounts (no more than 6 - 8 packets per day; i.e. Splenda)   Fluid Goal  The first goal is to drink at least 8 ounces of protein shake/drink  per day (or as directed by the nutritionist); some examples of protein shakes are [employer], [employer], EAS Edge HP, and Unjury. See handout from pre-op Bariatric Education Class:  Slowly increase the amount of protein shake you drink as tolerated  You may find it easier to slowly sip shakes throughout the day  It is important to get your proteins in first  Your fluid goal is to drink 64 - 100 ounces of fluid daily  It may take a few weeks to build up to this  32 oz (or more) should be clear liquids  And  32 oz (or more) should be full liquids (see below for examples)  Liquids should not contain sugar, caffeine, or carbonation   Clear Liquids:  Water or Sugar-free flavored water (i.e. Fruit H2O, Propel)  Decaffeinated coffee or tea (sugar-free)  Crystal Lite, Wyler's Lite, Minute Maid Lite  Sugar-free Jell-O  Bouillon or broth  Sugar-free Popsicle: *Less than 20 calories each; Limit 1 per day   Full Liquids:  Protein Shakes/Drinks + 2 choices per day of other full liquids  Full liquids must be:  No More Than 12 grams of Carbs per serving  No More Than 3 grams of Fat per serving  Strained low-fat cream soup  Non-Fat milk  Fat-free Lactaid Milk  Sugar-free yogurt (Dannon Lite & Fit, Greek yogurt)     Clayton Bibles, MS, RD, LDN Pager: [phone] After Hours Pager: [phone]

## 2014-04-06 NOTE — Progress Notes (Signed)
Inpatient Diabetes Program Recommendations  AACE/ADA: New Consensus Statement on Inpatient Glycemic Control (2013)  Target Ranges:  Prepandial:   less than 140 mg/dL      Peak postprandial:   less than 180 mg/dL (1-2 hours)      Critically ill patients:  140 - 180 mg/dL     Results for [name], [name] (MRN [id number]) as of 04/06/2014 09:11  Ref. Range 04/05/2014 12:05 04/05/2014 17:38 04/05/2014 20:01 04/05/2014 23:59 04/06/2014 04:10  Glucose-Capillary Latest Range: 70-99 mg/dL 174 (H) 168 (H) 196 (H) 238 (H) 259 (H)    Results for [name], [name] (MRN [id number]) as of 04/06/2014 09:11  Ref. Range 04/05/2014 17:38  Hemoglobin A1C Latest Range: <5.7 % 9.1 (H)      Patient admitted for Gastric band surgery with Dr. Hassell Done.  History of DM2, HTN, OSA.  Home DM Meds: Metformin 2000 mg daily       Glipizide 10 mg daily  Current Meds: Novolog Resistant SSI Q4 hours (started last night at 8pm)    Of note, patient saw his Endocrinologist (Dr. Benjiman Core with [facility]) on 03/29/14.  Per Dr. Arman Filter notes, no changes were made to patient's oral DM medication regimen.  Patient is to follow up with Dr. Cruzita Lederer on 05/11/14 for further glucose management post-surgery.     Will follow while inpatient Wyn Quaker RN, MSN, CDE Diabetes Coordinator Inpatient Diabetes Program Team Pager: [phone] (8a-10p)

## 2014-04-06 NOTE — Discharge Summary (Signed)
Physician Discharge Summary  Patient ID: [name] MRN: [id number] DOB/AGE: 59-Aug-1957 59 y.o.  Admit date: 04/05/2014 Discharge date: 04/06/2014  Admission Diagnoses:  Metabolic syndrome  Discharge Diagnoses:  same  Active Problems:   Lapband APS + hiatal hernia repair Oct 1448   Metabolic syndrome X   Surgery:  lapband APS with hiatal hernia repair  Discharged Condition: improved   Hospital Course:   Had surgery.  PD 1 had swallow that looked ok.  Ready for discharge  Consults: none  Significant Diagnostic Studies: UGI    Discharge Exam: Blood pressure 144/95, pulse 90, temperature 98.3 F (36.8 C), temperature source Oral, resp. rate 13, height 5\' 11"  (1.803 m), weight 313 lb (141.976 kg), SpO2 97.00%. Incisions ok.    Disposition:   Discharge Instructions   Discharge instructions    Complete by:  As directed   Follow the bariatric diet postop     Discharge wound care:    Complete by:  As directed   May shower and leave Dermabond to peel off on its own     Increase activity slowly    Complete by:  As directed      No dressing needed    Complete by:  As directed             Medication List         amLODipine 5 MG tablet  Commonly known as:  NORVASC  Take 1 tablet (5 mg total) by mouth every morning.     aspirin 81 MG tablet  Take 81 mg by mouth every morning.     calcium carbonate 1250 MG chewable tablet  Commonly known as:  OS-CAL  Chew 1 tablet by mouth daily.     fluticasone 50 MCG/ACT nasal spray  Commonly known as:  FLONASE  Place 1 spray into both nostrils daily as needed for allergies or rhinitis.     glipiZIDE 10 MG 24 hr tablet  Commonly known as:  GLUCOTROL XL  Take 1 tablet (10 mg total) by mouth daily with breakfast.     HYDROcodone-acetaminophen 7.5-325 mg/15 ml solution  Commonly known as:  HYCET     ibuprofen 200 MG tablet  Commonly known as:  ADVIL,MOTRIN  Take 400-600 mg by mouth once as needed for headache or mild  pain.     lisinopril-hydrochlorothiazide 20-12.5 MG per tablet  Commonly known as:  PRINZIDE,ZESTORETIC  Take 2 tablets by mouth every morning.     loratadine 10 MG tablet  Commonly known as:  CLARITIN  Take 10 mg by mouth every morning.     metFORMIN 500 MG 24 hr tablet  Commonly known as:  GLUCOPHAGE-XR  Take 4 tablets (2,000 mg total) by mouth daily with breakfast.     metoprolol 100 MG tablet  Commonly known as:  LOPRESSOR  Take 1 tablet (100 mg total) by mouth every morning.     multivitamin tablet  Take 1 tablet by mouth every morning.     PROBIOTIC DAILY Caps  Take 1 capsule by mouth daily.     simvastatin 20 MG tablet  Commonly known as:  ZOCOR  Take 1 tablet (20 mg total) by mouth every morning.     vitamin C 100 MG tablet  Take 300 mg by mouth every morning.           Follow-up Information   Follow up with Pedro Earls, MD.   Specialty:  General Surgery   Contact information:   [address] [place] [address] [phone]       Signed: Pedro Earls 04/06/2014, 11:23 AM

## 2014-04-06 NOTE — Progress Notes (Signed)
Discharge instructions given per Vilinda Flake Bariatric coordinator. No prescriptions

## 2014-04-06 NOTE — Discharge Instructions (Signed)
Laparoscopic Gastric Band Surgery, Care After These instructions give you information on caring for yourself after your procedure. Your doctor may also give you more specific instructions. Call your doctor if you have any problems or questions after your procedure. HOME CARE   Take walks throughout the day. Do not sit for longer than 1 hour while awake for 4 to 6 weeks.  You may shower 2 days after surgery. Pat the surgery cuts (incisions) dry. Do not rub the surgery cuts.  Do your coughing and deep breathing exercises.  Do not lift, push, or pull anything heavy until your doctor says it is okay.  Only take medicines as told by your doctor. Do not drive while taking pain medicine.  Drink plenty of fluids to keep your pee (urine) clear or pale yellow.  Stay on a liquid diet as long as your doctor tells you to.  Do not drink caffeine for 1 month.  Change bandages (dressings) as told by your doctor.  Check your surgery cuts for redness, puffiness (swelling), abnormal coloring, fluid, or bleeding.  Follow your doctor's advice about vitamin and protein needs after surgery. GET HELP RIGHT AWAY IF:  You feel sick to your stomach (nauseous) and throw up (vomit).  You have pain and discomfort with swallowing.  You develop shortness of breath or difficulty breathing.  You have pain, puffiness, or feel warmth on your lower body.  You have very bad calf pain or pain not relieved by medicine.  You have a temperature by mouth above 102 F (38.9 C).  Your surgery cuts look red, puffy, or they leak fluid.  Your poop (stool) is black, tarry, or dark red.  You have chills.  You have chest pain.  You feel confused.  You have slurred speech.  You feel light-headed when standing.  You suddenly feel weak.  You have any questions or concerns. MAKE SURE YOU:   Understand these instructions.  Will watch your condition.  Will get help right away if you are not doing well or get  worse. Document Released: 07/07/2010 Document Revised: 10/19/2013 Document Reviewed: 07/07/2010 [facility] Patient Information 2015 [place], [name]. This information is not intended to replace advice given to you by your health care provider. Make sure you discuss any questions you have with your health care provider.                    ADJUSTABLE GASTRIC BAND  Home Care Instructions   These instructions are to help you care for yourself when you go home.  Call: If you have any problems.   Call [phone] and ask for the surgeon on call   If you need immediate assistance come to the ER at [facility]. Tell the ER staff you are a new post-op gastric banding patient  Signs and symptoms to report:   Severe  vomiting or nausea o If you cannot handle clear liquids for longer than 1 day, call your surgeon   Abdominal pain which does not get better after taking your pain medication   Fever greater than 100.4  F and chills   Heart rate over 100 beats a minute   Trouble breathing   Chest pain   Redness,  swelling, drainage, or foul odor at incision (surgical) sites   If your incisions open or pull apart   Swelling or pain in calf (lower leg)   Diarrhea (Loose bowel movements that happen often), frequent watery, uncontrolled bowel movements   Constipation, (no bowel  movements for 3 days) if this happens: o Take Milk of Magnesia, 2 tablespoons by mouth, 3 times a day for 2 days if needed o Stop taking Milk of Magnesia once you have had a bowel movement o Call your doctor if constipation continues Or o Take Miralax  (instead of Milk of Magnesia) following the label instructions o Stop taking Miralax once you have had a bowel movement o Call your doctor if constipation continues   Anything you think is abnormal for you   Normal side effects after surgery:   Unable to sleep at night or unable to concentrate   Irritability   Being tearful (crying) or depressed  These are common  complaints, possibly related to your anesthesia, stress of surgery, and change in lifestyle, that usually go away a few weeks after surgery. If these feelings continue, call your medical doctor.  Wound Care: You may have surgical glue, steri-strips, or staples over your incisions after surgery   Surgical glue: Looks like clear film over your incisions and will wear off a little at a time   Steri-strips: Adhesive strips of tape over your incisions. You may notice a yellowish color on skin under the steri-strips. This is used to make the steri-strips stick better. Do not pull the steri-strips off - let them fall off   Staples: Staples may be removed before you leave the hospital o If you go home with staples, call [facility] for an appointment with your surgeons nurse to have staples removed 10 days after surgery, [phone]   Showering: You may shower two (2) days after your surgery unless your surgeon tells you differently o Wash gently around incisions with warm soapy water, rinse well, and gently pat dry o If you have a drain (tube from your incision), you may need someone to hold this while you shower o No tub baths until staples are removed and incisions are healed   Medications:   Medications should be liquid or crushed if larger than the size of a dime   Extended release pills (medication that releases a little bit at a time through the  day) should not be crushed   Depending on the size and number of medications you take, you may need to space (take a few throughout the day)/change the time you take your medications so that you do not over-fill your pouch (smaller stomach)   Make sure you follow-up with you primary care physician to make medication changes needed during rapid weight loss and life -style changes   If you have diabetes, follow up with your doctor that orders your diabetes medication(s) within one week after surgery and check your blood sugar regularly    Do  not drive while taking narcotics (pain medications)    Do not take acetaminophen (Tylenol) and Roxicet or Lortab Elixir at the same time since these pain medications contain acetaminophen   Diet:  First 2 Weeks You will see the nutritionist about two (2) weeks after your surgery. The nutritionist will increase the types of foods you can eat if you are handling liquids well:   If you have severe vomiting or nausea and cannot handle clear liquids lasting longer than 1 day call your surgeon For Same Day Surgery Discharge Patients:   The day of surgery drink water only: 2 ounces every 4 hours   If you are handling water, start drinking your high protein shake the next morning For Overnight Stay Patients:   Begin by drinking  2 ounces of a high protein every 3 hours, 5-6 times per day   Slowly increase the amount you drink as tolerated   You may find it easier to slowly sip shakes throughout the day. It is important to get your proteins in first    Protein Shake   Drink at least 2 ounces of shake 5-6 times per day   Each serving of protein shakes (usually 8-12 ounces) should have a minimum of: o 15 grams of protein o And no more than 5 grams of carbohydrate   Goal for protein each day: o Men = 80 grams per day o Women = 60 grams per day   Protein powder may be added to fluids such as non-fat milk or Lactaid milk or Soy milk (limit to 35 grams added protein powder per serving)  Hydration   Slowly increase the amount of water and other clear liquids as tolerated (See Acceptable Fluids)   Slowly increase the amount of protein shake as tolerated   Sip fluids slowly and throughout the day   May use sugar substitutes in small amounts (no more than 6-8 packets per day; i.e. Splenda)  Fluid Goal   The first goal is to drink at least 8 ounces of protein shake/drink per day (or as directed by the nutritionist); some examples of protein shakes are Syntrax, Nectar, [employer], EAS Edge HP, and  Unjury. - See handout from pre-op Bariatric Education Class: o Slowly increase the amount of protein shake you drink as tolerated o You may find it easier to slowly sip shakes throughout the day o It is important to get your proteins in first   Your fluid goal is to drink 64-100 ounces of fluid daily o It may take a few weeks to build up to this    32 oz. (or more) should be full liquids (see below for examples)   Liquids should not contain sugar, caffeine, or carbonation  Clear Liquids:   Water of Sugar-free flavored water (i.e. Fruit HO, Propel)   Decaffeinated coffee or tea (sugar-free)   Crystal lite, Wylers Lite, Minute Maid Lite   Sugar-free Jell-O   Bouillon or broth   Sugar-free Popsicle:    - Less than 20 calories each; Limit 1 per day        Full Liquids:                   Protein Shakes/Drinks + 2 choices per day of other full liquids   Full liquids must be: o No More Than 12 grams of Carbs per serving o No More Than 3 grams of Fat per serving   Strained low-fat cream soup   Non-Fat milk   Fat-free Lactaid Milk   Sugar-free yogurt (Dannon Lite & Fit, Greek yogurt)   Vitamins and Minerals   Start 1 day after surgery unless otherwise directed by your surgeon   1 Chewable Multivitamin / Multimineral Supplement with iron (i.e. Centrum for Adults)   Chewable Calcium Citrate with Vitamin D-3 (Example: 3 Chewable Calcium  Plus 600 with vitamin D-3) o Take 500 mg three (3) times a day for a total of 1500 mg per day o Do not take all 3 doses of calcium at one time as it may cause constipation, and you can only absorb 500 mg at a time o Do not mix multivitamins containing iron with calcium supplements;  take 2 hours apart o Do not substitute Tums (calcium carbonate) for your calcium  Menstruating women and those at risk for anemia ( a blood disease that causes weakness) may need extra iron o Talk to your doctor to see if you need more iron   If you need extra iron: total  daily iron recommendation (including Vitamins) is 50 to 100 mg Iron/day   Do not stop taking or change any vitamins or minerals until you talk to your nutritionist or surgeon   Your nutritionist and/or surgeon must approve all vitamin and mineral supplements  Activity and Exercise: It is important to continue walking at home. Limit your physical activity as instructed by your doctor. During this time, use these guidelines:   Do not lift anything greater than ten  (10) pounds for at least two (2) weeks   Do not go back to work or drive until Engineer, production says you can   You may have sex when you feel comfortable o It is VERY important for male patients to use a reliable birth control method; fertility often increase after surgery o Do not get pregnant for at least 18 months   Start exercising as soon as your doctor tells you that you can o Make sure your doctor approves any physical activity   Start with a simple walking program   Walk 5-15 minutes each day, 7 days per week   Slowly increase until you are walking 30-45 minutes per day   Consider joining our [place] program. [phone] or email [email or web address]    Special Instructions  Things to remember:   Free counseling is available for you and your family through collaboration between [facility] and [place]. Please call [phone] and leave a message   Use your CPAP when sleeping if this applies to you   Consider buying a medical alert bracelet that says you had lap-band surgery   You will likely have your first fill (fluid added to your band) 6 - 8 weeks after surgery   [facility] has a free Bariatric Surgery Support Group that meets monthly, the 3rd Thursday, [place]. You can see classes online at [email or web address]   It is very important to keep all follow up appointments with your surgeon, nutritionist, primary care physician, and behavioral health practitioner o After the first  year, please follow up with your bariatric surgeon and nutritionist at least once a year in order to maintain best weight loss results                    [facility]:  Success: [phone]               Bariatric Nurse Coordinator: [phone]      Adjustable Gastric Band Home Care Instructions  Rev. 07/2012                                                                  Reviewed and Vilinda Boehringer  by [facility] Patient Education Committee, Jan, 2014

## 2014-04-08 ENCOUNTER — Telehealth (HOSPITAL_COMMUNITY): Payer: Self-pay

## 2014-04-08 NOTE — Telephone Encounter (Signed)
Made discharge phone call to patient per DROP protocol. Asking the following questions.    1. Do you have someone to care for you now that you are home?  yes 2. Are you having pain now that is not relieved by your pain medication?  no 3. Are you able to drink the recommended daily amount of fluids (48 ounces minimum/day) and protein (60-80 grams/day) as prescribed by the dietitian or nutritional counselor?  yes 4. Are you taking the vitamins and minerals as prescribed?  yes 5. Do you have the "on call" number to contact your surgeon if you have a problem or question?  yes 6. Are your incisions free of redness, swelling or drainage? (If steri strips, address that these can fall off, shower as tolerated) yes 7. Have your bowels moved since your surgery?  If not, are you passing gas?  No, yes 8. Are you up and walking 3-4 times per day?  yes    1. Do you have an appointment made to see your surgeon in the next month?  yes 2. Were you provided your discharge medications before your surgery or before you were discharged from the hospital and are you taking them without problem?  yes 3. Were you provided phone numbers to the clinic/surgeon's office?  yes 4. Did you watch the patient education video module in the (clinic, surgeon's office, etc.) before your surgery? yes 5. Do you have a discharge checklist that was provided to you in the hospital to reference with instructions on how to take care of yourself after surgery?  yes 6. Did you see a dietitian or nutritional counselor while you were in the hospital?  yes 7. Do you have an appointment to see a dietitian or nutritional counselor in the next month?  yes   

## 2014-04-09 ENCOUNTER — Encounter: Payer: Self-pay | Admitting: Internal Medicine

## 2014-04-09 ENCOUNTER — Telehealth: Payer: Self-pay

## 2014-04-09 ENCOUNTER — Telehealth (HOSPITAL_COMMUNITY): Payer: Self-pay

## 2014-04-09 ENCOUNTER — Ambulatory Visit (INDEPENDENT_AMBULATORY_CARE_PROVIDER_SITE_OTHER): Payer: BC Managed Care – PPO | Admitting: Family Medicine

## 2014-04-09 VITALS — BP 122/80 | HR 72 | Temp 98.0°F | Resp 16 | Ht 73.0 in | Wt 303.0 lb

## 2014-04-09 DIAGNOSIS — N390 Urinary tract infection, site not specified: Secondary | ICD-10-CM

## 2014-04-09 DIAGNOSIS — R319 Hematuria, unspecified: Secondary | ICD-10-CM

## 2014-04-09 DIAGNOSIS — R300 Dysuria: Secondary | ICD-10-CM

## 2014-04-09 LAB — POCT URINALYSIS DIPSTICK
Bilirubin, UA: NEGATIVE
Glucose, UA: NEGATIVE
Ketones, UA: NEGATIVE
Nitrite, UA: NEGATIVE
Protein, UA: NEGATIVE
Spec Grav, UA: <=1.005
Urobilinogen, UA: 0.2
pH, UA: 6.5

## 2014-04-09 LAB — POCT UA - MICROSCOPIC ONLY
Casts, Ur, LPF, POC: NEGATIVE
Crystals, Ur, HPF, POC: NEGATIVE
Mucus, UA: NEGATIVE
Yeast, UA: NEGATIVE

## 2014-04-09 MED ORDER — CEPHALEXIN 500 MG PO CAPS: 500.0000 mg | ORAL_CAPSULE | Freq: Three times a day (TID) | ORAL | Status: DC

## 2014-04-09 NOTE — Telephone Encounter (Signed)
Patient called in with complaints of tingling with urination, particularly towards the end of the stream.  This morning having burning, tingling and blood in urine.  Informed patient to call PCP and surgeon to make aware.  Possible UTI?  Patient questioned about possible kidney stones, again encouraged to call PCP and Bariatric Surgeon.    Will Continue to Monitor.

## 2014-04-09 NOTE — Telephone Encounter (Signed)
Pt had lap band surgery on Monday. Pt noticed blood in his urine this morning. Before the surgery he had some tingling sensation right after urinating. The tingling/burning sensation is still going on today.  I advised him to come into the clinic to be seen. He said he will either be in today or come in tomorrow to see Dr. Laney Pastor. I told him if he starts to develop pain or any worsening symptoms that he needs to come to the clinic and be seen today.  Pt did call [facility] and they told him the blood in his urine is probably not related to his surgery. They advised him to see Dr. Laney Pastor.

## 2014-04-09 NOTE — Progress Notes (Signed)
Chief Complaint:  Chief Complaint  Patient presents with  . Hematuria  . Dysuria    HPI: [name] is a 58 y.o. male who is here for  Dysuria, he actually has a 2 day hsitory oof tingling at the tip of his penis when he urinates, he denies any rashes. On Monday he had a  Lap band surgery for obesity, however a week before that he was having the tingling sensation . He underwent the surgery , he thinks they did not put a foley cath in him for the procedure, he states he called and asked the office and they told him they did not put in foley cath. During and after the hospital stay hhe wa sable to eat drink and urinate without any problems. He did not have any problems after surgery but then he went home and started having tingling feeling at the tip if his penis after urination starting on Thursday. He denies any hx of kidney stones but since he has been doing the prep for the lap band he has been on a high protein diet and also taking calcium chewable  He is here because he not only had the worsening tingling but he also had  2-3 red drops in the toilet bowel. Burning and uncomfortable with urination.  Deneis back pain that is acute, nausea, vomiting, fevers, chill, rashes.  He is a former smoker   Past Medical History  Diagnosis Date  . Diabetes mellitus   . Hypertension   . Rotator cuff tear   . Dyslipidemia   . Morbid obesity   . Hyperlipidemia   . Sleep apnea   . OSA (obstructive sleep apnea)   . Family history of anesthesia complication     pt states took days for his father to awaken after mask was used   . Seasonal allergies   . Shortness of breath     pt states related to high BP meds with extended walking or climbling stairs  . GERD (gastroesophageal reflux disease)     has been having acid reflux since recent endoscopy   . H/O hiatal hernia   . Arthritis   . Difficult intubation 04/05/2014   Past Surgical History  Procedure Laterality Date  . Knee  surgery      left knee torn meniscus  . Vasectomy    . Cardiac catheterization      approx 3 years ago   . Colonscopy       2012  . Laparoscopic gastric banding N/A 04/05/2014    Procedure: LAPAROSCOPIC GASTRIC BANDING;  Surgeon: Pedro Earls, MD;  Location: [facility];  Service: General;  Laterality: N/A;   History   Social History  . Marital Status: Married    Spouse Name: N/A    Number of Children: N/A  . Years of Education: N/A   Social History Main Topics  . Smoking status: Former Smoker -- 2.00 packs/day for 18 years    Types: Cigarettes    Start date: 07/19/1973    Quit date: 12/13/1991  . Smokeless tobacco: Never Used  . Alcohol Use: 1.8 - 3.0 oz/week    3-5 Glasses of wine per week     Comment: takes 3 to 5 glasses of wine nightly   . Drug Use: No  . Sexual Activity: None   Other Topics Concern  . None   Social History Narrative  . None   No family history on file. Allergies  Allergen  Reactions  . Erythromycin   . Penicillins     Upset stomach  . Shellfish Allergy     Leg swelling    Prior to Admission medications   Medication Sig Start Date End Date Taking? Authorizing Provider  amLODipine (NORVASC) 5 MG tablet Take 1 tablet (5 mg total) by mouth every morning. 03/26/14  Yes Mancel Bale, PA-C  Ascorbic Acid (VITAMIN C) 100 MG tablet Take 300 mg by mouth every morning.   Yes Historical Provider, MD  aspirin 81 MG tablet Take 81 mg by mouth every morning.    Yes Historical Provider, MD  calcium carbonate (OS-CAL) 1250 MG chewable tablet Chew 1 tablet by mouth daily.   Yes Historical Provider, MD  fluticasone (FLONASE) 50 MCG/ACT nasal spray Place 1 spray into both nostrils daily as needed for allergies or rhinitis.   Yes Historical Provider, MD  glipiZIDE (GLUCOTROL XL) 10 MG 24 hr tablet Take 1 tablet (10 mg total) by mouth daily with breakfast. 03/26/14  Yes Mancel Bale, PA-C  HYDROcodone-acetaminophen (HYCET) 7.5-325 mg/15 ml solution  03/26/14  Yes  Historical Provider, MD  ibuprofen (ADVIL,MOTRIN) 200 MG tablet Take 400-600 mg by mouth once as needed for headache or mild pain.   Yes Historical Provider, MD  lisinopril-hydrochlorothiazide (PRINZIDE,ZESTORETIC) 20-12.5 MG per tablet Take 2 tablets by mouth every morning. 03/26/14  Yes Mancel Bale, PA-C  loratadine (CLARITIN) 10 MG tablet Take 10 mg by mouth every morning.    Yes Historical Provider, MD  metFORMIN (GLUCOPHAGE-XR) 500 MG 24 hr tablet Take 4 tablets (2,000 mg total) by mouth daily with breakfast. 03/26/14  Yes Mancel Bale, PA-C  metoprolol (LOPRESSOR) 100 MG tablet Take 1 tablet (100 mg total) by mouth every morning. 04/01/14  Yes Leandrew Koyanagi, MD  Multiple Vitamin (MULTIVITAMIN) tablet Take 1 tablet by mouth every morning.    Yes Historical Provider, MD  Probiotic Product (PROBIOTIC DAILY) CAPS Take 1 capsule by mouth daily.   Yes Historical Provider, MD  simvastatin (ZOCOR) 20 MG tablet Take 1 tablet (20 mg total) by mouth every morning. 03/26/14  Yes Sarah Alleen Borne, PA-C     ROS: The patient denies fevers, chills, night sweats, unintentional weight loss, chest pain, palpitations, wheezing, dyspnea on exertion, nausea, vomiting, abdominal pain,  melena, numbness, weakness,. + dysuria, hematuria,  All other systems have been reviewed and were otherwise negative with the exception of those mentioned in the HPI and as above.    PHYSICAL EXAM: Filed Vitals:   04/09/14 1316  BP: 122/80  Pulse: 72  Temp: 98 F (36.7 C)  Resp: 16   Filed Vitals:   04/09/14 1316  Height: 6\' 1"  (1.854 m)  Weight: 303 lb (137.44 kg)   Body mass index is 39.98 kg/(m^2).  General: Alert, no acute distress, obese white male.  HEENT:  Normocephalic, atraumatic, oropharynx patent. EOMI, PERRLA Cardiovascular:  Regular rate and rhythm, no rubs murmurs or gallops.  Radial pulse intact. No pedal edema.  Respiratory: Clear to auscultation bilaterally.  No wheezes, rales, or rhonchi.  No  cyanosis, no use of accessory musculature GI: No organomegaly, abdomen is soft and non-tender, positive bowel sounds.  No masses. Skin: No rashes. + abd wounds, healing well. + erythema, no e.o infection Neurologic: Facial musculature symmetric. Psychiatric: Patient is appropriate throughout our interaction. Lymphatic: No cervical lymphadenopathy Musculoskeletal: Gait intact. No CVA tenderness   LABS: Results for orders placed in visit on 04/09/14  POCT URINALYSIS DIPSTICK  Result Value Ref Range   Color, UA light yellow     Clarity, UA hazy     Glucose, UA neg     Bilirubin, UA neg     Ketones, UA neg     Spec Grav, UA <=1.005     Blood, UA large     pH, UA 6.5     Protein, UA neg     Urobilinogen, UA 0.2     Nitrite, UA neg     Leukocytes, UA large (3+)    POCT UA - MICROSCOPIC ONLY      Result Value Ref Range   WBC, Ur, HPF, POC 15-20     RBC, urine, microscopic 4-7     Bacteria, U Microscopic small     Mucus, UA neg     Epithelial cells, urine per micros 0-1     Crystals, Ur, HPF, POC neg     Casts, Ur, LPF, POC neg     Yeast, UA neg       EKG/XRAY:   Primary read interpreted by Dr. Marin Comment at [facility].   ASSESSMENT/PLAN: Encounter Diagnoses  Name Primary?  . Dysuria   . Hematuria   . UTI (lower urinary tract infection) Yes   58 y/o obese male who is here with an UTI after recent lap band procedure. He had sxs prior to the procedure but it went away and so he did not mention it during pre-op. He did not get a foley cath during surgery, he was fine post surgery until yesterday.  He did receive IV abx during or prio to the start of the operation . PMH poorly controlled DM prior to lap band surgery on Monday (his glucose has been in the 80-100s), HTN , Hyperlipidemia, OSA   Rx Keflex 500 mg TID x 7 days Will culture urine Advise to monitor for hypoglycemia.  Fu prn  Gross sideeffects, risk and benefits, and alternatives of medications d/w patient. Patient is aware  that all medications have potential sideeffects and we are unable to predict every sideeffect or drug-drug interaction that may occur.  LE, Tynd[place], DO 04/09/2014 6:52 PM    2

## 2014-04-09 NOTE — Patient Instructions (Signed)
Hypoglycemia Hypoglycemia occurs when the glucose in your blood is too low. Glucose is a type of sugar that is your body's main energy source. Hormones, such as insulin and glucagon, control the level of glucose in the blood. Insulin lowers blood glucose and glucagon increases blood glucose. Having too much insulin in your blood stream, or not eating enough food containing sugar, can result in hypoglycemia. Hypoglycemia can happen to people with or without diabetes. It can develop quickly and can be a medical emergency.  CAUSES   Missing or delaying meals.  Not eating enough carbohydrates at meals.  Taking too much diabetes medicine.  Not timing your oral diabetes medicine or insulin doses with meals, snacks, and exercise.  Nausea and vomiting.  Certain medicines.  Severe illnesses, such as hepatitis, kidney disorders, and certain eating disorders.  Increased activity or exercise without eating something extra or adjusting medicines.  Drinking too much alcohol.  A nerve disorder that affects body functions like your heart rate, blood pressure, and digestion (autonomic neuropathy).  A condition where the stomach muscles do not function properly (gastroparesis). Therefore, medicines and food may not absorb properly.  Rarely, a tumor of the pancreas can produce too much insulin. SYMPTOMS   Hunger.  Sweating (diaphoresis).  Change in body temperature.  Shakiness.  Headache.  Anxiety.  Lightheadedness.  Irritability.  Difficulty concentrating.  Dry mouth.  Tingling or numbness in the hands or feet.  Restless sleep or sleep disturbances.  Altered speech and coordination.  Change in mental status.  Seizures or prolonged convulsions.  Combativeness.  Drowsiness (lethargic).  Weakness.  Increased heart rate or palpitations.  Confusion.  Pale, gray skin color.  Blurred or double vision.  Fainting. DIAGNOSIS  A physical exam and medical history will be  performed. Your caregiver may make a diagnosis based on your symptoms. Blood tests and other lab tests may be performed to confirm a diagnosis. Once the diagnosis is made, your caregiver will see if your signs and symptoms go away once your blood glucose is raised.  TREATMENT  Usually, you can easily treat your hypoglycemia when you notice symptoms.  Check your blood glucose. If it is less than 70 mg/dl, take one of the following:   3-4 glucose tablets.    cup juice.    cup regular soda.   1 cup skim milk.   -1 tube of glucose gel.   5-6 hard candies.   Avoid high-fat drinks or food that may delay a rise in blood glucose levels.  Do not take more than the recommended amount of sugary foods, drinks, gel, or tablets. Doing so will cause your blood glucose to go too high.   Wait 10-15 minutes and recheck your blood glucose. If it is still less than 70 mg/dl or below your target range, repeat treatment.   Eat a snack if it is more than 1 hour until your next meal.  There may be a time when your blood glucose may go so low that you are unable to treat yourself at home when you start to notice symptoms. You may need someone to help you. You may even faint or be unable to swallow. If you cannot treat yourself, someone will need to bring you to the hospital.  HOME CARE INSTRUCTIONS  If you have diabetes, follow your diabetes management plan by:  Taking your medicines as directed.  Following your exercise plan.  Following your meal plan. Do not skip meals. Eat on time.  Testing your blood   glucose regularly. Check your blood glucose before and after exercise. If you exercise longer or different than usual, be sure to check blood glucose more frequently.  Wearing your medical alert jewelry that says you have diabetes.  Identify the cause of your hypoglycemia. Then, develop ways to prevent the recurrence of hypoglycemia.  Do not take a hot bath or shower right after an  insulin shot.  Always carry treatment with you. Glucose tablets are the easiest to carry.  If you are going to drink alcohol, drink it only with meals.  Tell friends or family members ways to keep you safe during a seizure. This may include removing hard or sharp objects from the area or turning you on your side.  Maintain a healthy weight. SEEK MEDICAL CARE IF:   You are having problems keeping your blood glucose in your target range.  You are having frequent episodes of hypoglycemia.  You feel you might be having side effects from your medicines.  You are not sure why your blood glucose is dropping so low.  You notice a change in vision or a new problem with your vision. SEEK IMMEDIATE MEDICAL CARE IF:   Confusion develops.  A change in mental status occurs.  The inability to swallow develops.  Fainting occurs. Document Released: 06/04/2005 Document Revised: 06/09/2013 Document Reviewed: 10/01/2011 [facility] Patient Information 2015 [place], [name]. This information is not intended to replace advice given to you by your health care provider. Make sure you discuss any questions you have with your health care provider. Urinary Tract Infection Urinary tract infections (UTIs) can develop anywhere along your urinary tract. Your urinary tract is your body's drainage system for removing wastes and extra water. Your urinary tract includes two kidneys, two ureters, a bladder, and a urethra. Your kidneys are a pair of bean-shaped organs. Each kidney is about the size of your fist. They are located below your ribs, one on each side of your spine. CAUSES Infections are caused by microbes, which are microscopic organisms, including fungi, viruses, and bacteria. These organisms are so small that they can only be seen through a microscope. Bacteria are the microbes that most commonly cause UTIs. SYMPTOMS  Symptoms of UTIs may vary by age and gender of the patient and by the location of the  infection. Symptoms in young women typically include a frequent and intense urge to urinate and a painful, burning feeling in the bladder or urethra during urination. Older women and men are more likely to be tired, shaky, and weak and have muscle aches and abdominal pain. A fever may mean the infection is in your kidneys. Other symptoms of a kidney infection include pain in your back or sides below the ribs, nausea, and vomiting. DIAGNOSIS To diagnose a UTI, your caregiver will ask you about your symptoms. Your caregiver also will ask to provide a urine sample. The urine sample will be tested for bacteria and white blood cells. White blood cells are made by your body to help fight infection. TREATMENT  Typically, UTIs can be treated with medication. Because most UTIs are caused by a bacterial infection, they usually can be treated with the use of antibiotics. The choice of antibiotic and length of treatment depend on your symptoms and the type of bacteria causing your infection. HOME CARE INSTRUCTIONS  If you were prescribed antibiotics, take them exactly as your caregiver instructs you. Finish the medication even if you feel better after you have only taken some of the medication.  Drink  enough water and fluids to keep your urine clear or pale yellow.  Avoid caffeine, tea, and carbonated beverages. They tend to irritate your bladder.  Empty your bladder often. Avoid holding urine for long periods of time.  Empty your bladder before and after sexual intercourse.  After a bowel movement, women should cleanse from front to back. Use each tissue only once. SEEK MEDICAL CARE IF:   You have back pain.  You develop a fever.  Your symptoms do not begin to resolve within 3 days. SEEK IMMEDIATE MEDICAL CARE IF:   You have severe back pain or lower abdominal pain.  You develop chills.  You have nausea or vomiting.  You have continued burning or discomfort with urination. MAKE SURE YOU:    Understand these instructions.  Will watch your condition.  Will get help right away if you are not doing well or get worse. Document Released: 03/14/2005 Document Revised: 12/04/2011 Document Reviewed: 07/13/2011 [facility] Patient Information 2015 [place], [name]. This information is not intended to replace advice given to you by your health care provider. Make sure you discuss any questions you have with your health care provider.

## 2014-04-10 LAB — URINE CULTURE
Colony Count: NO GROWTH
Organism ID, Bacteria: NO GROWTH

## 2014-04-12 ENCOUNTER — Encounter: Payer: Self-pay | Admitting: Internal Medicine

## 2014-04-14 ENCOUNTER — Encounter: Payer: Self-pay | Admitting: Internal Medicine

## 2014-04-14 ENCOUNTER — Ambulatory Visit (INDEPENDENT_AMBULATORY_CARE_PROVIDER_SITE_OTHER): Payer: BLUE CROSS/BLUE SHIELD | Admitting: Internal Medicine

## 2014-04-14 VITALS — BP 105/75 | HR 67 | Temp 98.3°F | Resp 16 | Ht 71.5 in | Wt 294.0 lb

## 2014-04-14 DIAGNOSIS — E8881 Metabolic syndrome: Secondary | ICD-10-CM | POA: Diagnosis not present

## 2014-04-14 DIAGNOSIS — N189 Chronic kidney disease, unspecified: Secondary | ICD-10-CM | POA: Diagnosis not present

## 2014-04-14 DIAGNOSIS — E785 Hyperlipidemia, unspecified: Secondary | ICD-10-CM | POA: Diagnosis not present

## 2014-04-14 DIAGNOSIS — Z6841 Body Mass Index (BMI) 40.0 and over, adult: Secondary | ICD-10-CM

## 2014-04-14 DIAGNOSIS — Z9884 Bariatric surgery status: Secondary | ICD-10-CM | POA: Diagnosis not present

## 2014-04-14 DIAGNOSIS — E1122 Type 2 diabetes mellitus with diabetic chronic kidney disease: Secondary | ICD-10-CM | POA: Diagnosis not present

## 2014-04-14 DIAGNOSIS — I10 Essential (primary) hypertension: Secondary | ICD-10-CM

## 2014-04-14 MED ORDER — METOPROLOL TARTRATE 100 MG PO TABS: 100.0000 mg | ORAL_TABLET | ORAL | Status: DC

## 2014-04-14 MED ORDER — METFORMIN HCL ER 500 MG PO TB24: 2000.0000 mg | ORAL_TABLET | Freq: Every day | ORAL | Status: DC

## 2014-04-14 MED ORDER — SIMVASTATIN 20 MG PO TABS: 20.0000 mg | ORAL_TABLET | ORAL | Status: DC

## 2014-04-14 MED ORDER — LISINOPRIL-HYDROCHLOROTHIAZIDE 20-12.5 MG PO TABS: 2.0000 | ORAL_TABLET | ORAL | Status: DC

## 2014-04-14 NOTE — Patient Instructions (Signed)
Wt Readings from Last 3 Encounters:  04/14/14 294 lb (133.358 kg)  04/09/14 303 lb (137.44 kg)  04/05/14 313 lb (141.976 kg)

## 2014-04-14 NOTE — Progress Notes (Signed)
Subjective:  This chart was scribed for Leandrew Koyanagi, MD by Ladene Artist, ED Scribe. The patient was seen in room 25. Patient's care was started at 11:09 AM.   Patient ID: [name], male    DOB: 1956-04-07, 58 y.o.   MRN: [id number]  Chief Complaint  Patient presents with  . Follow-up    DM, lapband surgery   HPI HPI Comments: [name] is a 58 y.o. male, with a h/o DM, HTN, hyperlipidemia, who presents to the [facility] for follow-up regarding lapband surgery. Pt states that he has lost approximately 15 lbs since surgery on 04/05/14. Pt consumes [phone] calories daily. He states that he will have more fluid added in 4-5 weeks to the band.   Preventative Maintenance  Pt states that he has had 2 colonoscopies with the last being within 5 years.? When next  Rx Refill Pt reports that he went to [facility] to pick up his prescriptions when he noticed that his prescription was not written for 6 months.He called for refills.  Vitamins Pt takes 2 Flintstones chewable tablets daily. Has researched formulas and this gets everything.  Past Medical History  Diagnosis Date  . Diabetes mellitus   . Hypertension   . Rotator cuff tear   . Dyslipidemia   . Morbid obesity   . Hyperlipidemia   . Sleep apnea   . OSA (obstructive sleep apnea)   . Family history of anesthesia complication     pt states took days for his father to awaken after mask was used   . Seasonal allergies   . Shortness of breath     pt states related to high BP meds with extended walking or climbling stairs  . GERD (gastroesophageal reflux disease)     has been having acid reflux since recent endoscopy   . H/O hiatal hernia   . Arthritis   . Difficult intubation 04/05/2014   Current Outpatient Prescriptions on File Prior to Visit  Medication Sig Dispense Refill  . amLODipine (NORVASC) 5 MG tablet Take 1 tablet (5 mg total) by mouth every morning.  90 tablet  0  . Ascorbic  Acid (VITAMIN C) 100 MG tablet Take 300 mg by mouth every morning.      Marland Kitchen aspirin 81 MG tablet Take 81 mg by mouth every morning.       . cephALEXin (KEFLEX) 500 MG capsule Take 1 capsule (500 mg total) by mouth 3 (three) times daily.  21 capsule  0  . fluticasone (FLONASE) 50 MCG/ACT nasal spray Place 1 spray into both nostrils daily as needed for allergies or rhinitis.      Marland Kitchen ibuprofen (ADVIL,MOTRIN) 200 MG tablet Take 400-600 mg by mouth once as needed for headache or mild pain.      Marland Kitchen lisinopril-hydrochlorothiazide (PRINZIDE,ZESTORETIC) 20-12.5 MG per tablet Take 2 tablets by mouth every morning.      . loratadine (CLARITIN) 10 MG tablet Take 10 mg by mouth every morning.       . metFORMIN (GLUCOPHAGE-XR) 500 MG 24 hr tablet Take 4 tablets (2,000 mg total) by mouth daily with breakfast.  120 tablet  0  . metoprolol (LOPRESSOR) 100 MG tablet Take 1 tablet (100 mg total) by mouth every morning.  90 tablet  0  . Multiple Vitamin (MULTIVITAMIN) tablet Take 1 tablet by mouth every morning.       . simvastatin (ZOCOR) 20 MG tablet Take 1 tablet (20 mg total) by mouth  every morning.  90 tablet  0  . glipiZIDE (GLUCOTROL XL) 10 MG 24 hr tablet Take 1 tablet (10 mg total) by mouth daily with breakfast.  90 tablet  0   No current facility-administered medications on file prior to visit.   Allergies  Allergen Reactions  . Erythromycin   . Penicillins     Upset stomach  . Shellfish Allergy     Leg swelling    Review of Systems  Constitutional: Negative for fever, chills and fatigue.  no chest pain or palpita CPAP compl very good as in past-no daytime issues/less snoring/notes incr day activ     Objective:   Physical Exam  Nursing note and vitals reviewed. Constitutional: He is oriented to person, place, and time. He appears well-developed and well-nourished. No distress.  HENT:  Head: Normocephalic and atraumatic.  Eyes: Conjunctivae and EOM are normal.  Neck: Neck supple.    Pulmonary/Chest: Effort normal. No respiratory distress.  Musculoskeletal: Normal range of motion.  Neurological: He is alert and oriented to person, place, and time.  Skin: Skin is warm and dry.  Operative wounds all look good w/out redness tenderness or echymoses  Psychiatric: He has a normal mood and affect. His behavior is normal.   Triage Vitals: BP 105/75  Pulse 67  Temp(Src) 98.3 F (36.8 C)  Resp 16  Ht 5' 11.5" (1.816 m)  Wt 294 lb (133.358 kg)  BMI 40.44 kg/m2  SpO2 95%    Assessment & Plan:  BMI 40.0-44.9, adult---has started loss post band  Type 2 diabetes mellitus with diabetic chronic kidney disease/PN---expecting drop in med needs --already off glpi cause  some sugars in 70s  Hyperlipidemia  Labs 2 mos  Essential hypertension  Report any postural dizziness to reduce meds  Metabolic syndrome X  Banded!!!!  Lapband APS + hiatal hernia repair Oct 2015  stable  I personally performed the services described in this documentation, which was scribed in my presence. The recorded information has been reviewed and is accurate.  Meds ordered this encounter  Medications  . lisinopril-hydrochlorothiazide (PRINZIDE,ZESTORETIC) 20-12.5 MG per tablet    Sig: Take 2 tablets by mouth every morning.    Dispense:  360 tablet    Refill:  1  . simvastatin (ZOCOR) 20 MG tablet    Sig: Take 1 tablet (20 mg total) by mouth every morning.    Dispense:  180 tablet    Refill:  1  . metFORMIN (GLUCOPHAGE-XR) 500 MG 24 hr tablet    Sig: Take 4 tablets (2,000 mg total) by mouth daily with breakfast.    Dispense:  720 tablet    Refill:  0  . metoprolol (LOPRESSOR) 100 MG tablet    Sig: Take 1 tablet (100 mg total) by mouth every morning.    Dispense:  180 tablet    Refill:  1  Ordered to clear up 6 mo request F/u 2 mos

## 2014-04-16 DIAGNOSIS — Z01818 Encounter for other preprocedural examination: Secondary | ICD-10-CM | POA: Diagnosis not present

## 2014-04-16 NOTE — Progress Notes (Signed)
  Bariatric Class:  Appt start time: 1000 end time:  1100.  2 Week Post-Operative Nutrition Class  Patient was seen on 04/16/2014 for Post-Operative Nutrition education at [facility].   Surgery date: 04/05/2014 Surgery type: LAGB Start weight at [facility]: 312 lbs on 02/16/2014 Weight today: 293.0 lbs  Weight change: 19 lbs  TANITA  BODY COMP RESULTS  03/22/14 04/16/14   BMI (kg/m^2) 42.4 39.7   Fat Mass (lbs) 130.5 127.0   Fat Free Mass (lbs) 182.0 166.0   Total Body Water (lbs) 133.0 121.5    The following the learning objectives were met by the patient during this course:  Identifies Phase 3A (Soft, High Proteins) Dietary Goals and will begin from 2 weeks post-operatively to 2 months post-operatively  Identifies appropriate sources of fluids and proteins   States protein recommendations and appropriate sources post-operatively  Identifies the need for appropriate texture modifications, mastication, and bite sizes when consuming solids  Identifies appropriate multivitamin and calcium sources post-operatively  Describes the need for physical activity post-operatively and will follow MD recommendations  States when to call healthcare provider regarding medication questions or post-operative complications  Handouts given during class include:  Phase 3A: Soft, High Protein Diet Handout  Follow-Up Plan: Patient will follow-up at [facility] in 6 weeks for 2 month post-op nutrition visit for diet advancement per MD.

## 2014-04-20 ENCOUNTER — Ambulatory Visit: Payer: BC Managed Care – PPO

## 2014-04-21 ENCOUNTER — Encounter: Payer: Self-pay | Admitting: Internal Medicine

## 2014-05-11 ENCOUNTER — Encounter: Payer: Self-pay | Admitting: Internal Medicine

## 2014-05-11 ENCOUNTER — Ambulatory Visit (INDEPENDENT_AMBULATORY_CARE_PROVIDER_SITE_OTHER): Payer: BC Managed Care – PPO | Admitting: Internal Medicine

## 2014-05-11 VITALS — BP 118/72 | HR 73 | Temp 98.3°F | Resp 12 | Wt 289.0 lb

## 2014-05-11 DIAGNOSIS — IMO0002 Reserved for concepts with insufficient information to code with codable children: Secondary | ICD-10-CM

## 2014-05-11 DIAGNOSIS — E1165 Type 2 diabetes mellitus with hyperglycemia: Secondary | ICD-10-CM

## 2014-05-11 DIAGNOSIS — E1129 Type 2 diabetes mellitus with other diabetic kidney complication: Secondary | ICD-10-CM

## 2014-05-11 NOTE — Patient Instructions (Signed)
Please continue Metformin XR 500 mg daily.  Please come back for a follow-up appointment in 3 months.

## 2014-05-11 NOTE — Progress Notes (Signed)
Patient ID: [name], male   DOB: January 25, 1956, 58 y.o.   MRN: [id number]  HPI: AYDRIEN [name] is a 58 y.o.-year-old male, returning for f/u for DM2, dx 2000, non-insulin-dependent, uncontrolled, with complications (PN, CKD). Last visit 1.5 mo ago.  He had lap band GBP on 04/05/2014.   Last hemoglobin A1c was: Lab Results  Component Value Date   HGBA1C 9.1* 04/05/2014   HGBA1C 8.0 01/13/2014   HGBA1C 9.5 10/21/2013   Pt is on a regimen of: He reduced Metformin XR 1000 mg po bid >> 500 mg in am We stopped Glipizide XL 10 mg. We tried Victoza >> nausea, then HA We tried Invokana >> not approved by [employer]. 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  Highest sugar was 163.  Pt's meals were very high calorie/high fat - now on the diet for GBP: - Breakfast: 2 fried eggs + bacon >> now protein shake - Lunch: wrap or [place] sandwich or steak or BBQ + baked potato or mushrooms from [employer]; [employer], [place] (tuna with mayo) >> veggies and shakes - Dinner: 3-5 glasses of wine!; chicken or fish + steamed veggies + olives; pizza >> smaller portions - Snacks: no  No exercise.  - + CKD, last BUN/creatinine:  Lab Results  Component Value Date   BUN 17 03/26/2014   CREATININE 1.25 04/05/2014  On Lisinopril. - last set of lipids: Lab Results  Component Value Date   CHOL 189 01/13/2014   HDL 37* 01/13/2014   LDLCALC 76 01/13/2014   TRIG  378* 01/13/2014   CHOLHDL 5.1 01/13/2014  On Zocor. - last eye exam was in ~09/2012. No DR.  - no numbness and tingling in his feet. He has mild chane in sensation on his soles. He had a normal foot exam in PCPs office on 10/21/2013.  He also has a history of OSA - compliant 100% with CPAP. He had a heart catheterization: 3 years ago >> normal.  I reviewed pt's medications, allergies, PMH, social hx, family hx and no changes required, except as mentioned above.  ROS: Constitutional: + weight loss, no fatigue Eyes: no blurry vision, no xerophthalmia ENT: no sore throat, no nodules palpated in throat, no dysphagia/odynophagia, no hoarseness Cardiovascular: no CP/SOB/palpitations/leg swelling Respiratory: no cough/SOB Gastrointestinal: no N/V/D/+ C Musculoskeletal: no muscle/joint aches: back pain, join pain Skin: no rashes Neurological: no tremors/numbness/tingling/dizziness, + HA  PE: BP 118/72 mmHg  Pulse 73  Temp(Src) 98.3 F (36.8 C) (Oral)  Resp 12  Wt 289 lb (131.09 kg)  SpO2 95% Body mass index is 39.19 kg/(m^2).  Wt Readings from Last 3 Encounters:  05/11/14 289 lb (131.09 kg)  04/16/14 293 lb (132.904 kg)  04/14/14 294 lb (133.358 kg)   Constitutional: obese, in NAD Eyes: PERRLA, EOMI, no exophthalmos ENT: moist mucous membranes, no  thyromegaly, no cervical lymphadenopathy Cardiovascular: RRR, No MRG Respiratory: CTA B Gastrointestinal: abdomen soft, NT, ND, BS+ Musculoskeletal: no deformities, strength intact in all 4 Skin: moist, warm, no rashes Neurological: no tremor with outstretched hands, DTR normal in all 4  ASSESSMENT: 1. DM2, non-insulin-dependent, uncontrolled, with complications - PN - CKD  PLAN:  1. Patient with long-standing, uncontrolled diabetes, now much improved on his GBP mostly liquid diet, despite stopping Glipizide XL and decreasing Metformin to 500 mg daily - I suggested to:  Patient Instructions  Please continue Metformin XR 500 mg  daily. Please come back for a follow-up appointment in 3 months. - needs a new yearly eye exam - refuses flu vaccine - he has many Qs about diet >> I answered most of them, but due to time limit, could not address them all. I suggested the Myfitnesspal app - Return to clinic in 3 mo with sugar log

## 2014-05-18 ENCOUNTER — Encounter: Payer: Self-pay | Admitting: Internal Medicine

## 2014-05-18 ENCOUNTER — Encounter: Payer: BC Managed Care – PPO | Attending: Internal Medicine | Admitting: Dietician

## 2014-05-18 VITALS — Ht 72.0 in | Wt 287.5 lb

## 2014-05-18 DIAGNOSIS — Z01818 Encounter for other preprocedural examination: Secondary | ICD-10-CM | POA: Insufficient documentation

## 2014-05-18 DIAGNOSIS — E669 Obesity, unspecified: Secondary | ICD-10-CM | POA: Diagnosis not present

## 2014-05-18 DIAGNOSIS — Z6841 Body Mass Index (BMI) 40.0 and over, adult: Secondary | ICD-10-CM | POA: Diagnosis not present

## 2014-05-18 DIAGNOSIS — Z713 Dietary counseling and surveillance: Secondary | ICD-10-CM | POA: Insufficient documentation

## 2014-05-18 NOTE — Patient Instructions (Addendum)
Goals:  Follow Phase 3B: High Protein + Non-Starchy Vegetables  Eat 3-6 small meals/snacks, every 3-5 hrs  Avoid drinking 15 minutes before, during and 30 minutes after eating  Aim for >30 min of physical activity daily  Try Citracal Petites  Limit wine  TANITA  BODY COMP RESULTS  03/22/14 04/16/14 05/18/14   BMI (kg/m^2) 42.4 39.7 39   Fat Mass (lbs) 130.5 127.0 99   Fat Free Mass (lbs) 182.0 166.0 188.5   Total Body Water (lbs) 133.0 121.5 138

## 2014-05-18 NOTE — Progress Notes (Signed)
  Follow-up visit:  6 Weeks Post-Operative LAGB Surgery  Medical Nutrition Therapy:  Appt start time: 345 end time:  430.  Primary concerns today: Post-operative Bariatric Surgery Nutrition Management.  [name] returns having lost 5.5 lbs. He has not had a band fill yet. Patient estimates eating 1,000-1,200 calories per day and states "I am eating the way I'm going to be eating for the rest of my life." He also reports that he drank a whole bottle of red wine 2 nights in a row recently. On Thanksgiving he ate both baked and deep fried [place] with gravy and stuffing and a crescent roll. He reports that he deliberately tried to wash food through pouch by drinking water while eating.   Surgery date: 04/05/2014 Surgery type: LAGB Start weight at [facility]: 312 lbs on 02/16/2014 Weight today: 287.5 lbs Weight change: 5.5 lbs Total weight lost: 24.5 lbs  TANITA  BODY COMP RESULTS  03/22/14 04/16/14 05/18/14   BMI (kg/m^2) 42.4 39.7 39   Fat Mass (lbs) 130.5 127.0 99   Fat Free Mass (lbs) 182.0 166.0 188.5   Total Body Water (lbs) 133.0 121.5 138    Preferred Learning Style:   No preference indicated   Learning Readiness:   Ready  24-hr recall: B (AM): Premier protein shake and yogurt (42g) Snk (AM):   L (PM): soup or 2 yogurts or Premier protein shake Snk (PM):   D (PM): 4-6 oz meat OR chipotle beans, chicken, cheese, and sour cream Snk (PM):   Fluid intake: 64+ oz water Estimated total protein intake: 90 grams per patient  Medications: Reduced metformin, no longer on Glipizide  Supplementation: taking multivitamin; not taking Calcium  Average CBG per patient: 100-120 mg/dL (drinks juice when BG gets into 90s) Last patient reported A1c: no recent A1c  Drinking while eating: yes Hair loss: none Carbonated beverages: had a rum and coke N/V/D/C: constipation Dumping syndrome: none Last Lap-Band fill: none, appointment on Thursday  Recent physical activity:  Upper body dumbbells  at home  Progress Towards Goal(s):  In progress.  Handouts given during visit include:  Phase 3B lean protein + non starchy vegetables   Nutritional Diagnosis:  Simms-3.3 Overweight/obesity related to past poor dietary habits and physical inactivity as evidenced by patient w/ recent LAGB surgery following dietary guidelines for continued weight loss.     Intervention:  Nutrition counseling provided. Reiterated post operative nutrition recommendations.   Teaching Method Utilized:  Visual Auditory  Barriers to learning/adherence to lifestyle change: unwillingness to comply with recommendations  Demonstrated degree of understanding via:  Teach Back   Monitoring/Evaluation:  Dietary intake, exercise, lap band fills, and body weight. Follow up in 2 months for 4 month post-op visit.

## 2014-07-03 ENCOUNTER — Encounter: Payer: Self-pay | Admitting: Internal Medicine

## 2014-07-03 ENCOUNTER — Ambulatory Visit (INDEPENDENT_AMBULATORY_CARE_PROVIDER_SITE_OTHER): Payer: BLUE CROSS/BLUE SHIELD

## 2014-07-03 ENCOUNTER — Ambulatory Visit (INDEPENDENT_AMBULATORY_CARE_PROVIDER_SITE_OTHER): Payer: BLUE CROSS/BLUE SHIELD | Admitting: Internal Medicine

## 2014-07-03 VITALS — BP 110/80 | HR 82 | Temp 98.7°F | Resp 16 | Ht 71.0 in | Wt 288.0 lb

## 2014-07-03 DIAGNOSIS — IMO0002 Reserved for concepts with insufficient information to code with codable children: Secondary | ICD-10-CM

## 2014-07-03 DIAGNOSIS — R0602 Shortness of breath: Secondary | ICD-10-CM

## 2014-07-03 DIAGNOSIS — R059 Cough, unspecified: Secondary | ICD-10-CM

## 2014-07-03 DIAGNOSIS — E1165 Type 2 diabetes mellitus with hyperglycemia: Secondary | ICD-10-CM

## 2014-07-03 DIAGNOSIS — R05 Cough: Secondary | ICD-10-CM

## 2014-07-03 DIAGNOSIS — J189 Pneumonia, unspecified organism: Secondary | ICD-10-CM

## 2014-07-03 LAB — POCT CBC
Granulocyte percent: 73.6 % (ref 37–80)
HCT, POC: 47.8 % (ref 43.5–53.7)
Hemoglobin: 15.8 g/dL (ref 14.1–18.1)
Lymph, poc: 2.2 (ref 0.6–3.4)
MCH, POC: 31.7 pg — AB (ref 27–31.2)
MCHC: 33 g/dL (ref 31.8–35.4)
MCV: 96 fL (ref 80–97)
MID (cbc): 0.1 (ref 0–0.9)
MPV: 6 fL (ref 0–99.8)
POC Granulocyte: 6.6 (ref 2–6.9)
POC LYMPH PERCENT: 24.8 % (ref 10–50)
POC MID %: 1.6 % (ref 0–12)
Platelet Count, POC: 199 K/uL (ref 142–424)
RBC: 4.98 M/uL (ref 4.69–6.13)
RDW, POC: 13.5 %
WBC: 8.9 K/uL (ref 4.6–10.2)

## 2014-07-03 LAB — POCT INFLUENZA A/B
Influenza A, POC: NEGATIVE
Influenza B, POC: NEGATIVE

## 2014-07-03 LAB — GLUCOSE, POCT (MANUAL RESULT ENTRY): POC Glucose: 140 mg/dL — AB (ref 70–99)

## 2014-07-03 LAB — POCT GLYCOSYLATED HEMOGLOBIN (HGB A1C): Hemoglobin A1C: 6.6

## 2014-07-03 MED ORDER — CEFTRIAXONE SODIUM 1 G IJ SOLR
1.0000 g | Freq: Once | INTRAMUSCULAR | Status: AC
  Administered 2014-07-03: 1 g via INTRAMUSCULAR

## 2014-07-03 MED ORDER — AZITHROMYCIN 500 MG PO TABS: 500.0000 mg | ORAL_TABLET | Freq: Every day | ORAL | Status: DC

## 2014-07-03 NOTE — Progress Notes (Signed)
   Subjective:    Patient ID: [name], male    DOB: 16-Dec-1955, 59 y.o.   MRN: [id number]  HPI Obese 59 yo with uncontrolled DM presents with cough, brown mucus and sob.Has lost 30 lbs had lap band surgery 2 -3 months ago. No cp and minimal fever. No record of flu shot, has had pneumovax. He thinks his DM is controlled, his last A1c was 9.1. Home glucoses have been better. Scheduled for f/up with Dr. Laney Pastor next week.    Review of Systems     Objective:   Physical Exam  Constitutional: He is oriented to person, place, and time. He appears well-nourished. No distress.  HENT:  Head: Normocephalic.  Right Ear: External ear normal.  Left Ear: External ear normal.  Nose: Mucosal edema, rhinorrhea and sinus tenderness present. Right sinus exhibits no maxillary sinus tenderness and no frontal sinus tenderness. Left sinus exhibits no maxillary sinus tenderness and no frontal sinus tenderness.  Mouth/Throat: Oropharynx is clear and moist.  Neck: Normal range of motion.  Cardiovascular: Normal rate, regular rhythm and normal heart sounds.   Pulmonary/Chest: Effort normal. No accessory muscle usage. No tachypnea. No respiratory distress. He has no decreased breath sounds. He has no wheezes. He has rhonchi. He has no rales.  Lymphadenopathy:    He has no cervical adenopathy.  Neurological: He is alert and oriented to person, place, and time. He exhibits normal muscle tone. Coordination normal.  Psychiatric: He has a normal mood and affect. His behavior is normal.     [facility] reading (PRIMARY) by  Dr.Guest. RLL infiltrate Results for orders placed or performed in visit on 07/03/14  POCT CBC  Result Value Ref Range   WBC 8.9 4.6 - 10.2 K/uL   Lymph, poc 2.2 0.6 - 3.4   POC LYMPH PERCENT 24.8 10 - 50 %L   MID (cbc) 0.1 0 - 0.9   POC MID % 1.6 0 - 12 %M   POC Granulocyte 6.6 2 - 6.9   Granulocyte percent 73.6 37 - 80 %G   RBC 4.98 4.69 - 6.13 M/uL   Hemoglobin 15.8 14.1 - 18.1  g/dL   HCT, POC 47.8 43.5 - 53.7 %   MCV 96.0 80 - 97 fL   MCH, POC 31.7 (A) 27 - 31.2 pg   MCHC 33.0 31.8 - 35.4 g/dL   RDW, POC 13.5 %   Platelet Count, POC 199.0 142 - 424 K/uL   MPV 6.0 0 - 99.8 fL  POCT Influenza A/B  Result Value Ref Range   Influenza A, POC Negative    Influenza B, POC Negative   POCT glucose (manual entry)  Result Value Ref Range   POC Glucose 140 (A) 70 - 99 mg/dl  POCT glycosylated hemoglobin (Hb A1C)  Result Value Ref Range   Hemoglobin A1C 6.6          Assessment & Plan:  Pneumonia/Rocephin 1g/Zithromax 500mg  5d/see Dr. Cammy Brochure next week T2DM now controlled 30lbs weight loss with Lap band surgery

## 2014-07-03 NOTE — Patient Instructions (Addendum)
Cough, Adult  A cough is a reflex that helps clear your throat and airways. It can help heal the body or may be a reaction to an irritated airway. A cough may only last 2 or 3 weeks (acute) or may last more than 8 weeks (chronic).  CAUSES Acute cough:  Viral or bacterial infections. Chronic cough:  Infections.  Allergies.  Asthma.  Post-nasal drip.  Smoking.  Heartburn or acid reflux.  Some medicines.  Chronic lung problems (COPD).  Cancer. SYMPTOMS   Cough.  Fever.  Chest pain.  Increased breathing rate.  High-pitched whistling sound when breathing (wheezing).  Colored mucus that you cough up (sputum). TREATMENT   A bacterial cough may be treated with antibiotic medicine.  A viral cough must run its course and will not respond to antibiotics.  Your caregiver may recommend other treatments if you have a chronic cough. HOME CARE INSTRUCTIONS   Only take over-the-counter or prescription medicines for pain, discomfort, or fever as directed by your caregiver. Use cough suppressants only as directed by your caregiver.  Use a cold steam vaporizer or humidifier in your bedroom or home to help loosen secretions.  Sleep in a semi-upright position if your cough is worse at night.  Rest as needed.  Stop smoking if you smoke. SEEK IMMEDIATE MEDICAL CARE IF:   You have pus in your sputum.  Your cough starts to worsen.  You cannot control your cough with suppressants and are losing sleep.  You begin coughing up blood.  You have difficulty breathing.  You develop pain which is getting worse or is uncontrolled with medicine.  You have a fever. MAKE SURE YOU:   Understand these instructions.  Will watch your condition.  Will get help right away if you are not doing well or get worse. Document Released: 12/01/2010 Document Revised: 08/27/2011 Document Reviewed: 12/01/2010 [facility] Patient Information 2015 [place], [name]. This information is not intended  to replace advice given to you by your health care provider. Make sure you discuss any questions you have with your health care provider. Pneumonia Pneumonia is an infection of the lungs.  CAUSES Pneumonia may be caused by bacteria or a virus. Usually, these infections are caused by breathing infectious particles into the lungs (respiratory tract). SIGNS AND SYMPTOMS   Cough.  Fever.  Chest pain.  Increased rate of breathing.  Wheezing.  Mucus production. DIAGNOSIS  If you have the common symptoms of pneumonia, your health care provider will typically confirm the diagnosis with a chest X-ray. The X-ray will show an abnormality in the lung (pulmonary infiltrate) if you have pneumonia. Other tests of your blood, urine, or sputum may be done to find the specific cause of your pneumonia. Your health care provider may also do tests (blood gases or pulse oximetry) to see how well your lungs are working. TREATMENT  Some forms of pneumonia may be spread to other people when you cough or sneeze. You may be asked to wear a mask before and during your exam. Pneumonia that is caused by bacteria is treated with antibiotic medicine. Pneumonia that is caused by the influenza virus may be treated with an antiviral medicine. Most other viral infections must run their course. These infections will not respond to antibiotics.  HOME CARE INSTRUCTIONS   Cough suppressants may be used if you are losing too much rest. However, coughing protects you by clearing your lungs. You should avoid using cough suppressants if you can.  Your health care provider may have  prescribed medicine if he or she thinks your pneumonia is caused by bacteria or influenza. Finish your medicine even if you start to feel better.  Your health care provider may also prescribe an expectorant. This loosens the mucus to be coughed up.  Take medicines only as directed by your health care provider.  Do not smoke. Smoking is a common cause  of bronchitis and can contribute to pneumonia. If you are a smoker and continue to smoke, your cough may last several weeks after your pneumonia has cleared.  A cold steam vaporizer or humidifier in your room or home may help loosen mucus.  Coughing is often worse at night. Sleeping in a semi-upright position in a recliner or using a couple pillows under your head will help with this.  Get rest as you feel it is needed. Your body will usually let you know when you need to rest. PREVENTION A pneumococcal shot (vaccine) is available to prevent a common bacterial cause of pneumonia. This is usually suggested for:  People over 40 years old.  Patients on chemotherapy.  People with chronic lung problems, such as bronchitis or emphysema.  People with immune system problems. If you are over 65 or have a high risk condition, you may receive the pneumococcal vaccine if you have not received it before. In some countries, a routine influenza vaccine is also recommended. This vaccine can help prevent some cases of pneumonia.You may be offered the influenza vaccine as part of your care. If you smoke, it is time to quit. You may receive instructions on how to stop smoking. Your health care provider can provide medicines and counseling to help you quit. SEEK MEDICAL CARE IF: You have a fever. SEEK IMMEDIATE MEDICAL CARE IF:   Your illness becomes worse. This is especially true if you are elderly or weakened from any other disease.  You cannot control your cough with suppressants and are losing sleep.  You begin coughing up blood.  You develop pain which is getting worse or is uncontrolled with medicines.  Any of the symptoms which initially brought you in for treatment are getting worse rather than better.  You develop shortness of breath or chest pain. MAKE SURE YOU:   Understand these instructions.  Will watch your condition.  Will get help right away if you are not doing well or get  worse. Document Released: 06/04/2005 Document Revised: 10/19/2013 Document Reviewed: 08/24/2010 [facility] Patient Information 2015 [place], [name]. This information is not intended to replace advice given to you by your health care provider. Make sure you discuss any questions you have with your health care provider.

## 2014-07-07 ENCOUNTER — Encounter: Payer: Self-pay | Admitting: Internal Medicine

## 2014-07-07 ENCOUNTER — Ambulatory Visit (INDEPENDENT_AMBULATORY_CARE_PROVIDER_SITE_OTHER): Payer: BLUE CROSS/BLUE SHIELD | Admitting: Internal Medicine

## 2014-07-07 VITALS — BP 142/90 | HR 70 | Temp 97.7°F | Resp 16 | Ht 72.0 in | Wt 283.6 lb

## 2014-07-07 DIAGNOSIS — E1165 Type 2 diabetes mellitus with hyperglycemia: Secondary | ICD-10-CM

## 2014-07-07 DIAGNOSIS — R21 Rash and other nonspecific skin eruption: Secondary | ICD-10-CM

## 2014-07-07 DIAGNOSIS — IMO0002 Reserved for concepts with insufficient information to code with codable children: Secondary | ICD-10-CM

## 2014-07-07 MED ORDER — METOPROLOL TARTRATE 100 MG PO TABS: 100.0000 mg | ORAL_TABLET | ORAL | Status: DC

## 2014-07-07 MED ORDER — AZITHROMYCIN 250 MG PO TABS: ORAL_TABLET | ORAL | Status: DC

## 2014-07-07 MED ORDER — AMLODIPINE BESYLATE 5 MG PO TABS: 5.0000 mg | ORAL_TABLET | ORAL | Status: DC

## 2014-07-07 NOTE — Progress Notes (Signed)
Subjective:    Patient ID: [name], male    DOB: 1955/12/03, 59 y.o.   MRN: [id number]  HPIf/u s/p lap band with signif wt loss here to follow-up on treatment for community-acquired pneumonia and to review labs 1/16  Patient Active Problem List   Diagnosis Date Noted  . DM type 2, uncontrolled, with renal complications 70/96/2836    Priority: High  . BMI 40.0-44.9, adult 08/29/2011    Priority: High  . Back pain 12/26/2011    Priority: Medium  . OSA (obstructive sleep apnea) 12/26/2011    Priority: Medium  . Cervical disc disease 12/26/2011    Priority: Medium  . Hypertension 08/29/2011    Priority: Medium  . Hyperlipidemia 08/29/2011    Priority: Medium  . Lapband APS + hiatal hernia repair Oct 2015 04/05/2014  . Metabolic syndrome X 62/94/7654  . Hearing loss-aides 03/26/2012    Current outpatient prescriptions:  .  amLODipine (NORVASC) 5 MG tablet, Take 1 tablet (5 mg total) by mouth every morning., Disp: 180 tablet, Rfl: 1 .  Ascorbic Acid (VITAMIN C) 100 MG tablet, Take 300 mg by mouth every morning., Disp: , Rfl:  .  aspirin 81 MG tablet, Take 81 mg by mouth every morning. , Disp: , Rfl:  .  azithromycin (ZITHROMAX) 500 MG tablet, Take 1 tablet (500 mg total) by mouth daily., Disp: 5 tablet, Rfl: 0 .  Cyanocobalamin (B-12 PO), Take 2,500 mg by mouth., Disp: , Rfl:  .  fluticasone (FLONASE) 50 MCG/ACT nasal spray, Place 1 spray into both nostrils daily as needed for allergies or rhinitis., Disp: , Rfl:  .  ibuprofen (ADVIL,MOTRIN) 200 MG tablet, Take 400-600 mg by mouth once as needed for headache or mild pain., Disp: , Rfl:  .  lisinopril-hydrochlorothiazide (PRINZIDE,ZESTORETIC) 20-12.5 MG per tablet, Take 2 tablets by mouth every morning., Disp: 360 tablet, Rfl: 1 .  loratadine (CLARITIN) 10 MG tablet, Take 10 mg by mouth every morning. , Disp: , Rfl:  .  metFORMIN (GLUCOPHAGE-XR) 500 MG 24 hr tablet, Take 4 tablets (2,000 mg total) by mouth daily with  breakfast. (Patient taking differently: Take 500 mg by mouth daily with breakfast. ), Disp: 720 tablet, Rfl: 0 .  metoprolol (LOPRESSOR) 100 MG tablet, Take 1 tablet (100 mg total) by mouth every morning., Disp: 180 tablet, Rfl: 1 .  Multiple Vitamin (MULTIVITAMIN) tablet, Take 1 tablet by mouth every morning. , Disp: , Rfl:  .  simvastatin (ZOCOR) 20 MG tablet, Take 1 tablet (20 mg total) by mouth every morning., Disp: 180 tablet, Rfl: 1 .  azithromycin (ZITHROMAX) 250 MG tablet, As packaged, Disp: 6 tablet, Rfl: 1  New skin rash over last few months, worse last 2 weeks -little red spots, dry areas, one area scabbing over eyebrow--he has had no real change in medications. He was at the beach for the last for 5 days and did notice the flare of the rash when out in direct sunlight. There is mild itching. Has a past history of dry skin in the winter.  5lb wt loss Post lapband---More liquid injected today-Dr Hassell Done They discussed incr wine consumpt- Diet better//still constip Lab Results  Component Value Date   HGBA1C 6.6 07/03/2014   Has completed antibiotic therapy and is much improved with only a slight nonproductive cough remaining  Review of Systems No fatigue fever No vision changes No chest pain or palpitations/no shortness of breath No abdominal pain nausea vomiting or diarrhea except for recent frequent loose  stools for a few days while traveling that resolved without treatment No peripheral edema    Objective:   Physical Exam BP 142/90 mmHg  Pulse 70  Temp(Src) 97.7 F (36.5 C) (Oral)  Resp 16  Ht 6' (1.829 m)  Wt 283 lb 9.6 oz (128.64 kg)  BMI 38.45 kg/m2  SpO2 96% 138/86 on repeat HEENT clear Lungs clear to auscultation Heart regular without murmur/no carotid bruits  Skin-has generalized dryness/also has several discrete small scaly plaques less than 1 cm scattered mainly over face and extremities but present in sun exposed areas more than and protected areas. The  rash is symmetrical  Wt Readings from Last 3 Encounters:  07/07/14 283 lb 9.6 oz (128.64 kg)  07/03/14 288 lb (130.636 kg)  05/19/14 287 lb 8 oz (130.409 kg)       Assessment & Plan:   Type II diabetes mellitus, uncontrolled - Plan: [facility] Diabetes Foot Exam  Because of his LAP-BAND surgery he is now controlled//he will follow-up with endocrinology soon Rash and nonspecific skin eruption - Plan: Ambulatory referral to Dermatology  This may all be dry skin reaction but it seems like a photosensitivity dermatitis or some more  generalized disorder must be ruled out  F/u Pneumonia--resolving and should be well without further treatment

## 2014-07-08 LAB — MICROALBUMIN, URINE: Microalb, Ur: 1 mg/dL (ref <2.0)

## 2014-07-20 ENCOUNTER — Ambulatory Visit: Payer: BC Managed Care – PPO | Admitting: Dietician

## 2014-08-11 ENCOUNTER — Ambulatory Visit: Payer: BC Managed Care – PPO | Admitting: Internal Medicine

## 2014-08-18 ENCOUNTER — Encounter: Payer: Self-pay | Admitting: Internal Medicine

## 2014-09-22 ENCOUNTER — Ambulatory Visit: Payer: Self-pay | Admitting: Dietician

## 2014-10-06 ENCOUNTER — Ambulatory Visit (INDEPENDENT_AMBULATORY_CARE_PROVIDER_SITE_OTHER): Payer: BLUE CROSS/BLUE SHIELD | Admitting: Internal Medicine

## 2014-10-06 ENCOUNTER — Encounter: Payer: Self-pay | Admitting: Internal Medicine

## 2014-10-06 VITALS — BP 131/87 | HR 69 | Temp 98.3°F | Resp 16 | Ht 71.25 in | Wt 286.6 lb

## 2014-10-06 DIAGNOSIS — E1129 Type 2 diabetes mellitus with other diabetic kidney complication: Secondary | ICD-10-CM

## 2014-10-06 DIAGNOSIS — K5900 Constipation, unspecified: Secondary | ICD-10-CM

## 2014-10-06 DIAGNOSIS — E1165 Type 2 diabetes mellitus with hyperglycemia: Secondary | ICD-10-CM

## 2014-10-06 DIAGNOSIS — E785 Hyperlipidemia, unspecified: Secondary | ICD-10-CM

## 2014-10-06 DIAGNOSIS — Z6841 Body Mass Index (BMI) 40.0 and over, adult: Secondary | ICD-10-CM

## 2014-10-06 DIAGNOSIS — IMO0002 Reserved for concepts with insufficient information to code with codable children: Secondary | ICD-10-CM

## 2014-10-06 LAB — LIPID PANEL
Cholesterol: 184 mg/dL (ref 0–200)
HDL: 42 mg/dL (ref >=40)
LDL Cholesterol: 71 mg/dL (ref 0–99)
Total CHOL/HDL Ratio: 4.4 Ratio
Triglycerides: 355 mg/dL — ABNORMAL HIGH (ref <150)
VLDL: 71 mg/dL — ABNORMAL HIGH (ref 0–40)

## 2014-10-06 LAB — POCT GLYCOSYLATED HEMOGLOBIN (HGB A1C): Hemoglobin A1C: 8.4

## 2014-10-06 MED ORDER — METFORMIN HCL ER 500 MG PO TB24: 1000.0000 mg | ORAL_TABLET | Freq: Every day | ORAL | Status: DC

## 2014-10-06 NOTE — Progress Notes (Signed)
Subjective:    Patient ID: [name], male    DOB: 12-30-1955, 59 y.o.   MRN: [id number]  HPI  Patient Active Problem List   Diagnosis Date Noted  . DM type 2, uncontrolled, with renal complications 70/78/6754    Priority: High  . BMI 40.0-44.9, adult 08/29/2011    Priority: High  . Back pain 12/26/2011    Priority: Medium  . OSA (obstructive sleep apnea) 12/26/2011    Priority: Medium  . Cervical disc disease 12/26/2011    Priority: Medium  . Hypertension 08/29/2011    Priority: Medium  . Hyperlipidemia 08/29/2011    Priority: Medium  . Lapband APS + hiatal hernia repair Oct 2015----didn't go for tightening again --beating restrictions by drinking his calories---eats small and frequently 04/05/2014  . Metabolic syndrome X 49/20/1007  . 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 He would love to  retire.    -  He has had significant problems with constipation since his surgery. He blames this on trying to eliminate carbohydrates and fats and go up on the protein. He has tried numerous medications. miralax made his bp go up. Ducal axes unpleasant increase lots of gas. Fiber didn't work.  Prior to Admission medications   Medication Sig Start Date End Date Taking? Authorizing Provider  amLODipine (NORVASC) 5 MG tablet Take 1 tablet (5 mg total) by mouth every morning. 07/07/14  Yes Leandrew Koyanagi, MD  Ascorbic Acid (VITAMIN C) 100 MG tablet Take 300 mg by mouth every morning.   Yes Historical Provider, MD  aspirin 81 MG tablet Take 81 mg by mouth every morning.    Yes Historical Provider, MD  Cyanocobalamin (B-12 PO) Take 2,500 mg by mouth.   Yes Historical Provider, MD    fluticasone (FLONASE) 50 MCG/ACT nasal spray Place 1 spray into both nostrils daily as needed for allergies or rhinitis.   Yes Historical Provider, MD  ibuprofen (ADVIL,MOTRIN) 200 MG tablet Take 400-600 mg by mouth once as needed for headache or mild pain.   Yes Historical Provider, MD  lisinopril-hydrochlorothiazide (PRINZIDE,ZESTORETIC) 20-12.5 MG per tablet Take 2 tablets by mouth every morning. 04/14/14  Yes Leandrew Koyanagi, MD  loratadine (CLARITIN) 10 MG tablet Take 10 mg by mouth every morning.    Yes Historical Provider, MD  metFORMIN (GLUCOPHAGE-XR) 500 MG 24 hr tablet  500 mg by mouth daily with breakfast. Takes 1 pill a day 04/14/14 1 500 a day since 12/1 Yes Leandrew Koyanagi, MD  metoprolol (LOPRESSOR) 100 MG tablet Take 1 tablet (100 mg total) by mouth every morning. 07/07/14  Yes Leandrew Koyanagi, MD  Multiple Vitamin (MULTIVITAMIN) tablet Take 1 tablet by mouth every morning.    Yes Historical Provider, MD  simvastatin (ZOCOR) 20 MG tablet Take 1 tablet (20 mg total) by mouth every morning. 04/14/14  Yes Leandrew Koyanagi, MD    Review of Systems     Objective:   Physical Exam  Wt Readings from Last 3 Encounters:  10/06/14 286 lb 9.6 oz (130.001 kg)  07/07/14 283 lb 9.6 oz (128.64 kg)  07/03/14  288 lb (130.636 kg)  BP 131/87 mmHg  Pulse 69  Temp(Src) 98.3 F (36.8 C) (Oral)  Resp 16  Ht 5' 11.25" (1.81 m)  Wt 286 lb 9.6 oz (130.001 kg)  BMI 39.68 kg/m2  SpO2 97% HEENT clear Heart regular Lungs clear Extremities with good peripheral pulses and minimal edema Cranial nerves II through XII intact Mood good affect appropriate-appropriately concerned. Diabetic foot exam completed as noted    A1C 8.4 Assessment & Plan:  DM type 2, uncontrolled, with renal complications - Plan: HM Diabetes Foot Exam, POCT glycosylated hemoglobin (Hb A1C)  BMI 40.0-44.9, adult  Hyperlipidemia - Plan: Lipid panel  Constipation, unspecified constipation  type  Plan Metformin increase to 1000 mg in the morning Weight loss Decrease wine consumption Gave 2 names for cognitive behavioral therapy Handout for other constipation remedies Recheck lipids Follow-up 3 months

## 2014-10-06 NOTE — Patient Instructions (Signed)
[facility] [address]

## 2014-11-02 LAB — HM DIABETES EYE EXAM

## 2014-11-24 ENCOUNTER — Encounter: Payer: Self-pay | Admitting: Internal Medicine

## 2014-11-24 ENCOUNTER — Ambulatory Visit (INDEPENDENT_AMBULATORY_CARE_PROVIDER_SITE_OTHER): Payer: BLUE CROSS/BLUE SHIELD | Admitting: Internal Medicine

## 2014-11-24 VITALS — BP 121/77 | HR 74 | Temp 98.2°F | Resp 16 | Ht 71.5 in | Wt 289.0 lb

## 2014-11-24 DIAGNOSIS — I10 Essential (primary) hypertension: Secondary | ICD-10-CM | POA: Diagnosis not present

## 2014-11-24 DIAGNOSIS — E785 Hyperlipidemia, unspecified: Secondary | ICD-10-CM

## 2014-11-24 DIAGNOSIS — Z6841 Body Mass Index (BMI) 40.0 and over, adult: Secondary | ICD-10-CM | POA: Diagnosis not present

## 2014-11-24 DIAGNOSIS — E1165 Type 2 diabetes mellitus with hyperglycemia: Secondary | ICD-10-CM | POA: Diagnosis not present

## 2014-11-24 DIAGNOSIS — K5900 Constipation, unspecified: Secondary | ICD-10-CM

## 2014-11-24 DIAGNOSIS — E1129 Type 2 diabetes mellitus with other diabetic kidney complication: Secondary | ICD-10-CM

## 2014-11-24 DIAGNOSIS — M545 Low back pain, unspecified: Secondary | ICD-10-CM

## 2014-11-24 DIAGNOSIS — IMO0002 Reserved for concepts with insufficient information to code with codable children: Secondary | ICD-10-CM

## 2014-11-24 LAB — POCT GLYCOSYLATED HEMOGLOBIN (HGB A1C): Hemoglobin A1C: 8.1

## 2014-11-24 NOTE — Progress Notes (Signed)
Subjective:    Patient ID: [name], male    DOB: 06/30/1955, 59 y.o.   MRN: [id number]  HPI here for follow-up Patient Active Problem List   Diagnosis Date Noted  . DM type 2, uncontrolled, with renal complications 65/08/5463    Priority: High He did not increase his metformin as directed but has tried to be better with diet. He continues 4 glasses a wine a day   . BMI 40.0-44.9, adult 08/29/2011    Priority: High No further weight loss status post gastric banding   . Back pain 12/26/2011    Priority: Medium He has had several episodes of back pain recently in the lumbar area so with radicular symptoms to the right side although this is not a constant finding and these had no weakness or sensation loss   . OSA (obstructive sleep apnea) 12/26/2011    Priority: Medium Continues to respond to CPAP   . Cervical disc disease 12/26/2011    Priority: Medium Neck pain is an issue at times   . Hypertension 08/29/2011    Priority: Medium  . Hyperlipidemia 08/29/2011    Priority: Medium  . Lapband APS + hiatal hernia repair Oct 2015 04/05/2014  . Metabolic syndrome X 68/05/7516  . Hearing loss-aides 03/26/2012    S/p lapband--gas/random BMs variable cosistency/constipation His pattern of bowel movements is extremely distressing to him and unexplainable based on the diet he's eating as there no precipitating factors to explain his shift from constipation to diarrhea with increased gas and bloating. 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 His last colonoscopy was incomplete in 2011 and needed a postprocedure barium enema which was thought to be negative. This was a terribly bad experience for him. No stool blood. No weight loss.  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 for substance abuse and ask for further resources  Review of Systems No other positive  symptoms outside of present illness    Objective:   Physical Exam BP 121/77 mmHg  Pulse 74  Temp(Src) 98.2 F (36.8 C) (Oral)  Resp 16  Ht 5' 11.5" (1.816 m)  Wt 289 lb (131.09 kg)  BMI 39.75 kg/m2  SpO2 95% He has a fair amount of trapezius tightness with limited neck range of motion secondary to discomfort Pupils equal round reactive to light and accommodation/EOMs conjugate Heart regular Lumbar area not specifically tender Straight leg raise negative bilaterally at 90 Intact distal motor and sensory with symmetrical reflexes Diabetes foot exam intact  Results for orders placed or performed in visit on 11/24/14  POCT glycosylated hemoglobin (Hb A1C)  Result Value Ref Range   Hemoglobin A1C 8.1         Assessment & Plan:  DM type 2, uncontrolled, with renal complications  - Plan: Comprehensive metabolic panel  He feels like additional metformin has not been helpful and wants to continue to work on diet and weight loss at this point///he did not follow-up with endocrinology as scheduled because he did not understand their recommendations  BMI 40.0-44.9, adult - Plan: TSH///status post gastric banding///needs more concentrated effort at diet  Essential hypertension--- stable  Hyperlipidemia - Plan: Lipid panel  Difficult and altered bowel movements  - Plan: Ambulatory referral to Gastroenterology--- this does not seem a likely aftereffect of gastric banding and I'm concerned that a repeat colonoscopy may be necessary--- I will refer for an  office visit first to consider his symptoms  Midline low back pain without sciatica that is persistent but with occasional right-sided radicular symptoms --He continues with physical therapy with [facility]  No med changes at this point Recheck 3 months

## 2014-11-25 LAB — COMPREHENSIVE METABOLIC PANEL
ALT: 28 U/L (ref 0–53)
AST: 35 U/L (ref 0–37)
Albumin: 3.9 g/dL (ref 3.5–5.2)
Alkaline Phosphatase: 42 U/L (ref 39–117)
BUN: 17 mg/dL (ref 6–23)
CO2: 22 mEq/L (ref 19–32)
Calcium: 8.7 mg/dL (ref 8.4–10.5)
Chloride: 93 mEq/L — ABNORMAL LOW (ref 96–112)
Creat: 1.08 mg/dL (ref 0.50–1.35)
Glucose, Bld: 301 mg/dL — ABNORMAL HIGH (ref 70–99)
Potassium: 3.5 mEq/L (ref 3.5–5.3)
Sodium: 134 mEq/L — ABNORMAL LOW (ref 135–145)
Total Bilirubin: 0.7 mg/dL (ref 0.2–1.2)
Total Protein: 6 g/dL (ref 6.0–8.3)

## 2014-11-25 LAB — LIPID PANEL
Cholesterol: 163 mg/dL (ref 0–200)
HDL: 30 mg/dL — ABNORMAL LOW (ref >=40)
Total CHOL/HDL Ratio: 5.4 Ratio
Triglycerides: 574 mg/dL — ABNORMAL HIGH (ref <150)

## 2014-11-25 LAB — CBC WITH DIFFERENTIAL/PLATELET
Basophils Absolute: 0 10*3/uL (ref 0.0–0.1)
Basophils Relative: 0 % (ref 0–1)
Eosinophils Absolute: 0.1 10*3/uL (ref 0.0–0.7)
Eosinophils Relative: 2 % (ref 0–5)
HCT: 43.5 % (ref 39.0–52.0)
Hemoglobin: 15 g/dL (ref 13.0–17.0)
Lymphocytes Relative: 32 % (ref 12–46)
Lymphs Abs: 1.4 10*3/uL (ref 0.7–4.0)
MCH: 32.6 pg (ref 26.0–34.0)
MCHC: 34.5 g/dL (ref 30.0–36.0)
MCV: 94.6 fL (ref 78.0–100.0)
MPV: 9.8 fL (ref 8.6–12.4)
Monocytes Absolute: 0.4 10*3/uL (ref 0.1–1.0)
Monocytes Relative: 8 % (ref 3–12)
Neutro Abs: 2.6 10*3/uL (ref 1.7–7.7)
Neutrophils Relative %: 58 % (ref 43–77)
Platelets: 196 10*3/uL (ref 150–400)
RBC: 4.6 MIL/uL (ref 4.22–5.81)
RDW: 13.6 % (ref 11.5–15.5)
WBC: 4.5 10*3/uL (ref 4.0–10.5)

## 2014-11-25 LAB — TSH: TSH: 1.408 u[IU]/mL (ref 0.350–4.500)

## 2014-11-26 ENCOUNTER — Encounter: Payer: Self-pay | Admitting: Gastroenterology

## 2015-01-26 ENCOUNTER — Encounter: Payer: Self-pay | Admitting: Gastroenterology

## 2015-01-26 ENCOUNTER — Ambulatory Visit (INDEPENDENT_AMBULATORY_CARE_PROVIDER_SITE_OTHER): Payer: BLUE CROSS/BLUE SHIELD | Admitting: Gastroenterology

## 2015-01-26 ENCOUNTER — Ambulatory Visit: Payer: BLUE CROSS/BLUE SHIELD | Admitting: Internal Medicine

## 2015-01-26 VITALS — BP 130/86 | HR 64 | Ht 71.0 in | Wt 292.5 lb

## 2015-01-26 DIAGNOSIS — R198 Other specified symptoms and signs involving the digestive system and abdomen: Secondary | ICD-10-CM

## 2015-01-26 DIAGNOSIS — R194 Change in bowel habit: Secondary | ICD-10-CM

## 2015-01-26 MED ORDER — LINACLOTIDE 145 MCG PO CAPS: 145.0000 ug | ORAL_CAPSULE | Freq: Every day | ORAL | Status: DC

## 2015-01-26 NOTE — Progress Notes (Signed)
HPI: This is a   pleasant 59 year old man   who was referred to me by Leandrew Koyanagi, MD  to evaluate  constipation, change in bowels .    Labs 11/2014 cbc, cmet, tsh were normal.  HbA1c was 8.   Chief complaint is constipation  Colonoscopies twice, has never had polyps.  No colon cancer in his family.  Lap band surgery 03/2015.  Has a lot of belching and flatus.   Lost 30 pounds, keeping it off.  Has not gone for back in for tightening.  Has been constipated since the surgery. Previously really varied a lot (loose stools alternating with constipation).   He is prone to upset stomach, usually late at night.  Also has continuous gas late at night. This is normal for him.  Drinks 3-4 glasses of wine daily (more at times), sometimes 5-6 glasses per night.    Drinks a lot of water throughout the day.      Review of systems: Pertinent positive and negative review of systems were noted in the above HPI section. Complete review of systems was performed and was otherwise normal.   Past Medical History  Diagnosis Date  . Diabetes mellitus   . Hypertension   . Rotator cuff tear   . Dyslipidemia   . Morbid obesity   . Hyperlipidemia   . Sleep apnea   . OSA (obstructive sleep apnea)   . Family history of anesthesia complication     pt states took days for his father to awaken after mask was used   . Seasonal allergies   . Shortness of breath     pt states related to high BP meds with extended walking or climbling stairs  . GERD (gastroesophageal reflux disease)     has been having acid reflux since recent endoscopy   . H/O hiatal hernia   . Arthritis   . 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 Meniscus repair Left     left knee torn meniscus  . Vasectomy    . Cardiac catheterization      approx 3 years ago   . Colonscopy       2012  . Laparoscopic gastric banding N/A 04/05/2014    Procedure: LAPAROSCOPIC GASTRIC  BANDING;  Surgeon: Pedro Earls, MD;  Location: [facility];  Service: General;  Laterality: N/A;  . Hernia repair      with gastric banding    Current Outpatient Prescriptions  Medication Sig Dispense Refill  . amLODipine (NORVASC) 5 MG tablet Take 1 tablet (5 mg total) by mouth every morning. 180 tablet 1  . Ascorbic Acid (VITAMIN C) 100 MG tablet Take 500 mg by mouth every morning.     Marland Kitchen aspirin 81 MG tablet Take 81 mg by mouth every morning.     Marland Kitchen BIOTIN PO Take by mouth daily.    . Cyanocobalamin 2500 MCG CHEW Chew 1 tablet by mouth daily.    . fluticasone (FLONASE) 50 MCG/ACT nasal spray Place 1 spray into both nostrils daily as needed for allergies or rhinitis.    Marland Kitchen ibuprofen (ADVIL,MOTRIN) 200 MG tablet Take 400-600 mg by mouth once as needed for headache or mild pain.    Marland Kitchen lisinopril-hydrochlorothiazide (PRINZIDE,ZESTORETIC) 20-12.5 MG per tablet Take 2 tablets by mouth every morning. 360 tablet 1  . loratadine (CLARITIN) 10 MG tablet Take 10 mg by mouth every morning.     . metFORMIN (GLUCOPHAGE-XR) 500 MG 24  hr tablet Take 500 mg by mouth daily with breakfast.    . metoprolol (LOPRESSOR) 100 MG tablet Take 1 tablet (100 mg total) by mouth every morning. 180 tablet 1  . Multiple Vitamin (MULTIVITAMIN) tablet Take 1 tablet by mouth every morning.     . Probiotic Product (PROBIOTIC DAILY PO) Take by mouth.    . simvastatin (ZOCOR) 20 MG tablet Take 1 tablet (20 mg total) by mouth every morning. 180 tablet 1   No current facility-administered medications for this visit.    Allergies as of 01/26/2015 - Review Complete 01/26/2015  Allergen Reaction Noted  . Erythromycin Nausea Only 01/13/2014  . Shellfish allergy  03/26/2014    Family History  Problem Relation Age of Onset  . Hypertension Brother   . Clotting disorder Mother     lung clot  . Mesothelioma Father   . Skin cancer Brother     Social History   Social History  . Marital Status: Married    Spouse Name: N/A   . Number of Children: 2  . Years of Education: N/A   Occupational History  . owner     [place] History Main Topics  . Smoking status: Former Smoker -- 2.00 packs/day for 18 years    Types: Cigarettes    Start date: 07/19/1973    Quit date: 12/13/1991  . Smokeless tobacco: Never Used  . Alcohol Use: 1.8 - 3.0 oz/week    3-5 Glasses of wine per week     Comment: takes 3 to 5 glasses of wine nightly   . Drug Use: No  . Sexual Activity: Not on file   Other Topics Concern  . Not on file   Social History Narrative     Physical Exam: Ht 5\' 11"  (1.803 m)  Wt 292 lb 8 oz (132.677 kg)  BMI 40.81 kg/m2 Constitutional: generally well-appearing Psychiatric: alert and oriented x3 Eyes: extraocular movements intact Mouth: oral pharynx moist, no lesions Neck: supple no lymphadenopathy Cardiovascular: heart regular rate and rhythm Lungs: clear to auscultation bilaterally Abdomen: soft, nontender, nondistended, no obvious ascites, no peritoneal signs, normal bowel sounds Extremities: no lower extremity edema bilaterally Skin: no lesions on visible extremities   Assessment and plan: 59 y.o. male with  chronic constipation, morbid obesity, status post LAP-BAND surgery 2015  He had a LAP-BAND surgery less than a year ago and does not seem to be really conforming to the recommended diet. It does seem possible, likely that the diet restrictions have contributed to some of his bowel changes. I recommended MiraLAX however he said that MiraLAX caused hypertension. Fiber supplements are usually carbohydrate based and those are not recommended with his surgery. In the end we decided he would try Linzess pills 145 g per pill one pill once daily. I'm also very struck by the fact that he drinks 3-4 and sometimes 5-6 glasses of wine daily. This is about a bottle of wine a day. I explained to him several times this is 5-600 completely worthless calories nutritionally. I have a  feeling that if he were to cut back on this he might have much more success with his weight loss, probably blood sugar control, and his bowel function may improve as well. He will call to report on his response after Linzess trial. We will call to get his previous gastroenterologist records sent here for review. It does not sound like he's ever had colon polyps removed and he does not have a family history  of colon cancer and so likely he will need recall colonoscopy 10 years from the time of his last one for colon cancer screening.   Owens Loffler, MD [facility] 01/26/2015, 10:38 AM  Cc: Leandrew Koyanagi, MD

## 2015-01-26 NOTE — Patient Instructions (Addendum)
We will get records sent from your previous gastroenterologist Dr. Teena Irani for review.  This will include any endoscopic (colonoscopy or upper endoscopy) procedures and any associated pathology reports.  Will determine when you need repeat colon cancer screening.   Try to conform with your post banding diet as best that you can. Trial of linzess 171mcg, one pill once daily. Call back in two weeks to report on the linzess. Cutting back on daily wine may help your bowels and will likely help your weight.

## 2015-01-27 ENCOUNTER — Telehealth: Payer: Self-pay | Admitting: Emergency Medicine

## 2015-01-27 NOTE — Telephone Encounter (Signed)
Error

## 2015-02-01 ENCOUNTER — Telehealth: Payer: Self-pay | Admitting: Gastroenterology

## 2015-02-01 NOTE — Telephone Encounter (Signed)
Pt has been notified and pre visit and colon scheduled

## 2015-02-01 NOTE — Telephone Encounter (Signed)
Colonoscopy 08/2002 Dr. Amedeo Plenty for hematochezia; 2 polyps removed, 1 retrieved; was TA on pathology.  Patty, Please call him. From what I can tell his last colonoscopy was 2004, + TA.  He needs surveillance colonoscopy ([place]).

## 2015-02-03 ENCOUNTER — Encounter: Payer: Self-pay | Admitting: Gastroenterology

## 2015-02-09 ENCOUNTER — Ambulatory Visit (INDEPENDENT_AMBULATORY_CARE_PROVIDER_SITE_OTHER): Payer: BLUE CROSS/BLUE SHIELD | Admitting: Internal Medicine

## 2015-02-09 ENCOUNTER — Encounter: Payer: Self-pay | Admitting: Internal Medicine

## 2015-02-09 ENCOUNTER — Telehealth: Payer: Self-pay | Admitting: *Deleted

## 2015-02-09 ENCOUNTER — Ambulatory Visit (AMBULATORY_SURGERY_CENTER): Payer: Self-pay | Admitting: *Deleted

## 2015-02-09 VITALS — Ht 72.0 in | Wt 291.4 lb

## 2015-02-09 VITALS — BP 146/91 | HR 70 | Resp 18 | Ht 71.0 in | Wt 289.2 lb

## 2015-02-09 DIAGNOSIS — Z6841 Body Mass Index (BMI) 40.0 and over, adult: Secondary | ICD-10-CM

## 2015-02-09 DIAGNOSIS — I10 Essential (primary) hypertension: Secondary | ICD-10-CM | POA: Diagnosis not present

## 2015-02-09 DIAGNOSIS — E1129 Type 2 diabetes mellitus with other diabetic kidney complication: Secondary | ICD-10-CM | POA: Diagnosis not present

## 2015-02-09 DIAGNOSIS — Z23 Encounter for immunization: Secondary | ICD-10-CM

## 2015-02-09 DIAGNOSIS — Z86010 Personal history of colonic polyps: Secondary | ICD-10-CM

## 2015-02-09 DIAGNOSIS — IMO0002 Reserved for concepts with insufficient information to code with codable children: Secondary | ICD-10-CM

## 2015-02-09 DIAGNOSIS — E1165 Type 2 diabetes mellitus with hyperglycemia: Secondary | ICD-10-CM

## 2015-02-09 DIAGNOSIS — E785 Hyperlipidemia, unspecified: Secondary | ICD-10-CM | POA: Diagnosis not present

## 2015-02-09 LAB — LIPID PANEL
Cholesterol: 188 mg/dL (ref 125–200)
HDL: 38 mg/dL — ABNORMAL LOW (ref >=40)
LDL Cholesterol: 77 mg/dL (ref <130)
Total CHOL/HDL Ratio: 4.9 Ratio (ref <=5.0)
Triglycerides: 364 mg/dL — ABNORMAL HIGH (ref <150)
VLDL: 73 mg/dL — ABNORMAL HIGH (ref <30)

## 2015-02-09 LAB — COMPREHENSIVE METABOLIC PANEL
ALT: 47 U/L — ABNORMAL HIGH (ref 9–46)
AST: 58 U/L — ABNORMAL HIGH (ref 10–35)
Albumin: 4.4 g/dL (ref 3.6–5.1)
Alkaline Phosphatase: 49 U/L (ref 40–115)
BUN: 11 mg/dL (ref 7–25)
CO2: 30 mmol/L (ref 20–31)
Calcium: 8.7 mg/dL (ref 8.6–10.3)
Chloride: 92 mmol/L — ABNORMAL LOW (ref 98–110)
Creat: 0.97 mg/dL (ref 0.70–1.33)
Glucose, Bld: 220 mg/dL — ABNORMAL HIGH (ref 65–99)
Potassium: 3.7 mmol/L (ref 3.5–5.3)
Sodium: 135 mmol/L (ref 135–146)
Total Bilirubin: 0.7 mg/dL (ref 0.2–1.2)
Total Protein: 6.4 g/dL (ref 6.1–8.1)

## 2015-02-09 LAB — POCT GLYCOSYLATED HEMOGLOBIN (HGB A1C): Hemoglobin A1C: 9.7

## 2015-02-09 MED ORDER — INSULIN PEN NEEDLE 31G X 5 MM MISC: Status: DC

## 2015-02-09 MED ORDER — MOVIPREP 100 G PO SOLR: 1.0000 | Freq: Once | ORAL | Status: DC

## 2015-02-09 MED ORDER — INSULIN GLARGINE 100 UNIT/ML SOLOSTAR PEN: 10.0000 [IU] | PEN_INJECTOR | Freq: Every day | SUBCUTANEOUS | Status: DC

## 2015-02-09 NOTE — Telephone Encounter (Signed)
Olivia Mackie and Dr. Ardis Hughs,  Thank you for referring the matter.  This pt is a difficult intubation so does not qualify for care at [facility].  Thanks,  Jenny Reichmann

## 2015-02-09 NOTE — Telephone Encounter (Signed)
Dr. Ardis Hughs and Jenny Reichmann,  I met with Mr. Rockhill this morning in [facility], he is scheduled for colonoscopy with you 02/16/15. During his PV appointment he discussed that he questions if he is having symptoms of GERD, difficulty swallowing of if his lapband has questionably moved. He is hoping to have an endoscopy in addition to his colonoscopy. Office visit appointments are scarce on your schedule and I discussed with Remo Lipps and Lanelle Bal the need for an office visit. Remo Lipps graciously allowed him to be scheduled with you tomorrow 02/10/15. He is aware that he may have to reschedule his colonoscopy appointment to accommodate an ECL, I was not comfortable just cancelling his colon given his history you reviewed records for with TA in 2004 and no repeat colonoscopy since. Also, I noted while doing his previsit he has "difficult intubation" commented in his history. It looks like while they were doing his lapband placement and hernia repair it took two attempts to intubate.  So, Mr. Colleran sees you tomorrow to assess need for [name] and when that might be appropriate to schedule or if it is more appropriate to do two different procedures. Also need clearance from anesthesia regarding appropriateness for [facility] procedure or if need for hospital procedure. Just a heads up and please advise.

## 2015-02-09 NOTE — Progress Notes (Signed)
Denies allergies to eggs or soy products. Denies complications with sedation or anesthesia. Denies O2 use. Denies use of diet or weight loss medications.  Emmi instructions given for colonoscopy.  

## 2015-02-09 NOTE — Progress Notes (Signed)
Subjective:    Patient ID: [name], male    DOB: 24-Apr-1956, 59 y.o.   MRN: [id number]  HPI TG very elevated last OV Ready to start insulin Not making progress with diet, decr etoh etc  constip-linzess led to quick BM then diarrhea later Colo and endo soon Dr Ardis Hughs  Patient Active Problem List   Diagnosis Date Noted  . DM type 2, uncontrolled, with renal complications 22/48/2500    Priority: High  . BMI 40.0-44.9, adult 08/29/2011    Priority: High  . Back pain 12/26/2011    Priority: Medium  . OSA (obstructive sleep apnea) 12/26/2011    Priority: Medium  . Cervical disc disease 12/26/2011    Priority: Medium  . Hypertension 08/29/2011    Priority: Medium  . Hyperlipidemia 08/29/2011    Priority: Medium  . Lapband APS + hiatal hernia repair Oct 2015 04/05/2014  . Metabolic syndrome X 37/09/8887  . Hearing loss-aides 03/26/2012    Current outpatient prescriptions:  .  amLODipine (NORVASC) 5 MG tablet, Take 1 tablet (5 mg total) by mouth every morning., Disp: 180 tablet, Rfl: 1 .  Ascorbic Acid (VITAMIN C) 100 MG tablet, Take 500 mg by mouth every morning. , Disp: , Rfl:  .  aspirin 81 MG tablet, Take 81 mg by mouth every morning. , Disp: , Rfl:  .  BIOTIN PO, Take by mouth daily., Disp: , Rfl:  .  Cyanocobalamin 2500 MCG CHEW, Chew 1 tablet by mouth daily., Disp: , Rfl:  .  fluticasone (FLONASE) 50 MCG/ACT nasal spray, Place 1 spray into both nostrils daily as needed for allergies or rhinitis., Disp: , Rfl:  .  ibuprofen (ADVIL,MOTRIN) 200 MG tablet, Take 400-600 mg by mouth once as needed for headache or mild pain., Disp: , Rfl:  .  lisinopril-hydrochlorothiazide (PRINZIDE,ZESTORETIC) 20-12.5 MG per tablet, Take 2 tablets by mouth every morning., Disp: 360 tablet, Rfl: 1 .  loratadine (CLARITIN) 10 MG tablet, Take 10 mg by mouth every morning. , Disp: , Rfl:  .  metFORMIN (GLUCOPHAGE-XR) 500 MG 24 hr tablet, Take 500 mg by mouth daily with breakfast., Disp: ,  Rfl:  .  metoprolol (LOPRESSOR) 100 MG tablet, Take 1 tablet (100 mg total) by mouth every morning., Disp: 180 tablet, Rfl: 1 .  MOVIPREP 100 G SOLR, Take 1 kit (200 g total) by mouth once., Disp: 1 kit, Rfl: 0 .  Multiple Vitamin (MULTIVITAMIN) tablet, Take 1 tablet by mouth every morning. , Disp: , Rfl:  .  Probiotic Product (PROBIOTIC DAILY PO), Take by mouth., Disp: , Rfl:  .  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 He appears well-developed and well-nourished. No distress.  HENT:  Head: Normocephalic and atraumatic.  Eyes: Pupils are equal, round, and reactive to light.  Neck: Normal range of motion.  Cardiovascular: Normal rate and regular rhythm.   Pulmonary/Chest: Effort normal. No respiratory distress.  Musculoskeletal: Normal range of motion.  Neurological: He is alert and oriented to person, place, and time.  Skin: Skin is warm and dry.  Psychiatric: He has a normal mood and affect. His behavior is normal.  Nursing note and vitals reviewed.  BP 146/91 mmHg  Pulse 70  Resp 18  Ht _0  (1.803 m)  Wt 289 lb 3.2 oz (131.18 kg)  BMI 40.35 kg/m2  SpO2 96% Wt Readings from Last 3 Encounters:  02/09/15 289 lb 3.2 oz (131.18 kg)  02/09/15 291 lb 6.4 oz (132.178 kg)  01/26/15 292 lb 8 oz (132.677 kg)      Assessment & Plan:  DM type 2, uncontrolled, with renal complications - Plan: POCT glycosylated hemoglobin (Hb A1C), Comprehensive metabolic panel, Microalbumin, urine  Hyperlipidemia - Plan: Lipid panel  BMI 40.0-44.9, adult  Essential hypertension  - Plan: Comprehensive metabolic panel  He is concerned and wanted to repeat some of his blood work as well as his urine microalbumin since close relative is just developed full kidney failure from diabetes   Labs Results for orders placed or performed in visit on 02/09/15  Lipid panel  Result Value Ref Range   Cholesterol 188 125 - 200 mg/dL   Triglycerides 364 (H) <150 mg/dL   HDL 38 (L) >=40 mg/dL   Total CHOL/HDL Ratio 4.9 <=5.0 Ratio   VLDL 73 (H) <30 mg/dL   LDL Cholesterol 77 <130 mg/dL  Comprehensive metabolic panel  Result Value Ref Range   Sodium 135 135 - 146 mmol/L   Potassium 3.7 3.5 - 5.3 mmol/L   Chloride 92 (L) 98 - 110 mmol/L   CO2 30 20 - 31 mmol/L   Glucose, Bld 220 (H) 65 - 99 mg/dL   BUN 11 7 - 25 mg/dL   Creat 0.97 0.70 - 1.33 mg/dL   Total Bilirubin 0.7 0.2 - 1.2 mg/dL   Alkaline Phosphatase 49 40 - 115 U/L   AST 58 (H) 10 - 35 U/L   ALT 47 (H) 9 - 46 U/L   Total Protein 6.4 6.1 - 8.1 g/dL   Albumin 4.4 3.6 - 5.1 g/dL   Calcium 8.7 8.6 - 10.3 mg/dL  Microalbumin, urine  Result Value Ref Range   Microalb, Ur 2.5 (H) <2.0 mg/dL  POCT glycosylated hemoglobin (Hb A1C)  Result Value Ref Range   Hemoglobin A1C 9.7    He will start lantus 10hs and send me BS pattern one month--cont metf

## 2015-02-10 ENCOUNTER — Ambulatory Visit (INDEPENDENT_AMBULATORY_CARE_PROVIDER_SITE_OTHER): Payer: BLUE CROSS/BLUE SHIELD | Admitting: Gastroenterology

## 2015-02-10 ENCOUNTER — Encounter: Payer: Self-pay | Admitting: Gastroenterology

## 2015-02-10 VITALS — BP 122/60 | HR 60 | Ht 71.0 in | Wt 291.2 lb

## 2015-02-10 DIAGNOSIS — R1314 Dysphagia, pharyngoesophageal phase: Secondary | ICD-10-CM | POA: Diagnosis not present

## 2015-02-10 DIAGNOSIS — R194 Change in bowel habit: Secondary | ICD-10-CM | POA: Diagnosis not present

## 2015-02-10 DIAGNOSIS — R198 Other specified symptoms and signs involving the digestive system and abdomen: Secondary | ICD-10-CM

## 2015-02-10 DIAGNOSIS — Z86010 Personal history of colonic polyps: Secondary | ICD-10-CM

## 2015-02-10 LAB — MICROALBUMIN, URINE: Microalb, Ur: 2.5 mg/dL — ABNORMAL HIGH (ref <2.0)

## 2015-02-10 NOTE — Patient Instructions (Addendum)
We will again get records from [place] (Dr. Amedeo Plenty colonoscopy 2011 and also another colonoscopy possibly done in 2004 at same group). We will cancel your upcoming colonoscopy for next week so that we reschedule for colonoscopy and EGD at same setting (need longer appt time for that double procedure) You will be set up for an upper endoscopy for dysphagia, swallowing sensation.  You will adjust your paperwork already received for 03/10/15 730 am.  Please arrive at 6 am at [facility] outpatient registration.  You have already purchased your prep.  Please follow the instructions.  You will be set up for a colonoscopy for change in bowels. Due to your difficult intubation previously, the procedures will have to be done the hospital (MAC sedation)

## 2015-02-10 NOTE — Progress Notes (Signed)
Review of pertinent gastrointestinal problems: 1. Adenomatous polyps: Colonoscopy 08/2002 Dr. Amedeo Plenty for hematochezia; 2 polyps removed, 1 retrieved; was TA on pathology. 2. Morbid obesity: s/p lap band procedure 03/2014 with moderate results (bmi 40 about year post op).  Same time 1 suture closure of HH posteriorly.   HPI: This is a  59 year old man    who was referred to me by Leandrew Koyanagi, MD  to evaluate  dysphasia .    Chief complaint is sensation in his esophagus, dysphagia like  Feels the sensation of an abrasion in his esophagus in the area of pouch, lap band.    He ate a salad last night and had gas all night.  When he   He mentioned the swallowing sensation to Dr. Hassell Done and thinks he was told to take miralax.  I saw him 2 weeks ago for different problem and I have scheduled him for colonoscopy after reviewing his outside information. He tells me that the 2000 for colonoscopy which I reviewed was not done by the physician I thought it was from and that the findings were different as well. Obviously we have incomplete, possibly inaccurate information here.  He explained he had a difficult airway intubation in the past and because of that would not be amenable to having procedure done in our endoscopy center here.   Review of systems: Pertinent positive and negative review of systems were noted in the above HPI section. Complete review of systems was performed and was otherwise normal.   Past Medical History  Diagnosis Date  . Diabetes mellitus   . Hypertension   . Rotator cuff tear   . Dyslipidemia   . Morbid obesity   . Hyperlipidemia   . Sleep apnea   . OSA (obstructive sleep apnea)   . Family history of anesthesia complication     pt states took days for his father to awaken after mask was used   . Seasonal allergies   . Shortness of breath     pt states related to high BP meds with extended walking or climbling stairs  . GERD (gastroesophageal reflux  disease)     has been having acid reflux since recent endoscopy   . H/O hiatal hernia   . Arthritis   . 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 Meniscus repair Left     left knee torn meniscus  . Vasectomy    . Cardiac catheterization      approx 3 years ago   . Colonscopy       2012  . Laparoscopic gastric banding N/A 04/05/2014    Procedure: LAPAROSCOPIC GASTRIC BANDING;  Surgeon: Pedro Earls, MD;  Location: [facility];  Service: General;  Laterality: N/A;  . Hernia repair      with gastric banding    Current Outpatient Prescriptions  Medication Sig Dispense Refill  . amLODipine (NORVASC) 5 MG tablet Take 1 tablet (5 mg total) by mouth every morning. 180 tablet 1  . Ascorbic Acid (VITAMIN C) 100 MG tablet Take 500 mg by mouth every morning.     Marland Kitchen aspirin 81 MG tablet Take 81 mg by mouth every morning.     Marland Kitchen BIOTIN PO Take by mouth daily.    . Cyanocobalamin 2500 MCG CHEW Chew 1 tablet by mouth daily.    . fluticasone (FLONASE) 50 MCG/ACT nasal spray Place 1 spray into both nostrils daily as needed for allergies or rhinitis.    Marland Kitchen  ibuprofen (ADVIL,MOTRIN) 200 MG tablet Take 400-600 mg by mouth once as needed for headache or mild pain.    . Insulin Glargine (LANTUS SOLOSTAR) 100 UNIT/ML Solostar Pen Inject 10 Units into the skin daily at 10 pm. Or at bedtime 5 pen PRN  . Insulin Pen Needle (B-D UF III MINI PEN NEEDLES) 31G X 5 MM MISC Use as directed once a day 100 each 2  . lisinopril-hydrochlorothiazide (PRINZIDE,ZESTORETIC) 20-12.5 MG per tablet Take 2 tablets by mouth every morning. 360 tablet 1  . loratadine (CLARITIN) 10 MG tablet Take 10 mg by mouth every morning.     . metFORMIN (GLUCOPHAGE-XR) 500 MG 24 hr tablet Take 500 mg by mouth daily with breakfast.    . metoprolol (LOPRESSOR) 100 MG tablet Take 1 tablet (100 mg total) by mouth every morning. 180 tablet 1  . MOVIPREP 100 G SOLR Take 1 kit (200 g total) by mouth once. 1  kit 0  . Multiple Vitamin (MULTIVITAMIN) tablet Take 1 tablet by mouth every morning.     . Probiotic Product (PROBIOTIC DAILY PO) Take by mouth.    . simvastatin (ZOCOR) 20 MG tablet Take 1 tablet (20 mg total) by mouth every morning. 180 tablet 1   No current facility-administered medications for this visit.    Allergies as of 02/10/2015 - Review Complete 02/10/2015  Allergen Reaction Noted  . Erythromycin Nausea Only 01/13/2014  . Shellfish allergy  03/26/2014    Family History  Problem Relation Age of Onset  . Hypertension Brother   . Clotting disorder Mother     lung clot  . Mesothelioma Father   . Skin cancer Brother   . Colon cancer Neg Hx     Social History   Social History  . Marital Status: Married    Spouse Name: N/A  . Number of Children: 2  . Years of Education: N/A   Occupational History  . owner     [place] History Main Topics  . Smoking status: Former Smoker -- 2.00 packs/day for 18 years    Types: Cigarettes    Start date: 07/19/1973    Quit date: 12/13/1991  . Smokeless tobacco: Never Used  . Alcohol Use: 1.8 - 3.0 oz/week    3-5 Glasses of wine per week     Comment: takes 3 to 5 glasses of wine nightly   . Drug Use: No  . Sexual Activity: Not on file   Other Topics Concern  . Not on file   Social History Narrative     Physical Exam: BP 122/60 mmHg  Pulse 60  Ht _0  (1.803 m)  Wt 291 lb 4 oz (132.11 kg)  BMI 40.64 kg/m2 Constitutional: generally well-appearing Psychiatric: alert and oriented x3 Eyes: extraocular movements intact Mouth: oral pharynx moist, no lesions Neck: supple no lymphadenopathy Cardiovascular: heart regular rate and rhythm Lungs: clear to auscultation bilaterally Abdomen: soft, nontender, nondistended, no obvious ascites, no peritoneal signs, normal bowel sounds Extremities: no lower extremity edema bilaterally Skin: no lesions on visible extremities   Assessment and plan: 59 y.o. male  with  change in bowel habits, dysphagia like symptoms, previous history of colon polyps, previous difficult airway intubation.  First because of his previous difficult airway intubation he is not an appropriate candidate for outpatient endoscopy center. We will therefore cancel our previously scheduled colonoscopy for next week and rescheduled to be done at the hospital. At the same time I would like to proceed with  EGD for his dysphagia like symptoms. It does seem that since he started around the time of his lap band procedure that they are probably related to the lap band placement. I asked him if he mention this to his bariatric surgeon and he said yes and that the surgeon recommended he take MiraLAX for it. But does not really make sense to me.   Owens Loffler, MD [facility] 02/10/2015, 2:22 PM  Cc: Leandrew Koyanagi, MD

## 2015-02-10 NOTE — Telephone Encounter (Signed)
Ok, thanks.

## 2015-02-11 MED ORDER — INSULIN GLARGINE 100 UNIT/ML SOLOSTAR PEN: 20.0000 [IU] | PEN_INJECTOR | Freq: Every day | SUBCUTANEOUS | Status: DC

## 2015-02-16 ENCOUNTER — Encounter: Payer: BLUE CROSS/BLUE SHIELD | Admitting: Gastroenterology

## 2015-02-27 ENCOUNTER — Encounter: Payer: Self-pay | Admitting: Internal Medicine

## 2015-03-01 ENCOUNTER — Encounter (HOSPITAL_COMMUNITY): Payer: Self-pay | Admitting: *Deleted

## 2015-03-04 ENCOUNTER — Other Ambulatory Visit: Payer: Self-pay

## 2015-03-04 NOTE — Telephone Encounter (Signed)
Please call in to [employer] for--- TrueTest Quad Electrode Laser Accuracy Quantity ---at least a 100 ...more if not a problem with [employer] being my provider.        Thanks and let me know when done....and I will go by and pick up and see what savings look like.        How many times per day does he need to test his sugar? I have to put in the sig.

## 2015-03-05 MED ORDER — GLUCOSE BLOOD VI STRP: ORAL_STRIP | Status: DC

## 2015-03-05 NOTE — Telephone Encounter (Signed)
TrueTest Quad Electrode Laser Accuracy Quantity ---#100  3 refills use as directed(monitor insulin therapy) Call in [employer]

## 2015-03-10 ENCOUNTER — Ambulatory Visit (HOSPITAL_COMMUNITY): Payer: BLUE CROSS/BLUE SHIELD | Admitting: Anesthesiology

## 2015-03-10 ENCOUNTER — Ambulatory Visit (HOSPITAL_COMMUNITY)
Admission: RE | Admit: 2015-03-10 | Discharge: 2015-03-10 | Disposition: A | Discharge status: Home / Self Care | Payer: BLUE CROSS/BLUE SHIELD | Source: Ambulatory Visit | Attending: Gastroenterology | Admitting: Gastroenterology

## 2015-03-10 ENCOUNTER — Encounter (HOSPITAL_COMMUNITY): Payer: Self-pay | Admitting: Certified Registered"

## 2015-03-10 ENCOUNTER — Encounter (HOSPITAL_COMMUNITY)
Admission: RE | Disposition: A | Discharge status: Home / Self Care | Payer: Self-pay | Source: Ambulatory Visit | Attending: Gastroenterology

## 2015-03-10 DIAGNOSIS — Z87891 Personal history of nicotine dependence: Secondary | ICD-10-CM | POA: Insufficient documentation

## 2015-03-10 DIAGNOSIS — Z791 Long term (current) use of non-steroidal anti-inflammatories (NSAID): Secondary | ICD-10-CM | POA: Insufficient documentation

## 2015-03-10 DIAGNOSIS — D125 Benign neoplasm of sigmoid colon: Secondary | ICD-10-CM

## 2015-03-10 DIAGNOSIS — R1310 Dysphagia, unspecified: Secondary | ICD-10-CM | POA: Insufficient documentation

## 2015-03-10 DIAGNOSIS — E119 Type 2 diabetes mellitus without complications: Secondary | ICD-10-CM | POA: Insufficient documentation

## 2015-03-10 DIAGNOSIS — Z794 Long term (current) use of insulin: Secondary | ICD-10-CM | POA: Diagnosis not present

## 2015-03-10 DIAGNOSIS — G4733 Obstructive sleep apnea (adult) (pediatric): Secondary | ICD-10-CM | POA: Diagnosis not present

## 2015-03-10 DIAGNOSIS — I10 Essential (primary) hypertension: Secondary | ICD-10-CM | POA: Insufficient documentation

## 2015-03-10 DIAGNOSIS — R1314 Dysphagia, pharyngoesophageal phase: Secondary | ICD-10-CM

## 2015-03-10 DIAGNOSIS — Z7982 Long term (current) use of aspirin: Secondary | ICD-10-CM | POA: Insufficient documentation

## 2015-03-10 DIAGNOSIS — R194 Change in bowel habit: Secondary | ICD-10-CM | POA: Diagnosis not present

## 2015-03-10 DIAGNOSIS — Z9884 Bariatric surgery status: Secondary | ICD-10-CM | POA: Insufficient documentation

## 2015-03-10 DIAGNOSIS — Z1211 Encounter for screening for malignant neoplasm of colon: Secondary | ICD-10-CM | POA: Insufficient documentation

## 2015-03-10 DIAGNOSIS — E785 Hyperlipidemia, unspecified: Secondary | ICD-10-CM | POA: Diagnosis not present

## 2015-03-10 DIAGNOSIS — K449 Diaphragmatic hernia without obstruction or gangrene: Secondary | ICD-10-CM | POA: Diagnosis not present

## 2015-03-10 DIAGNOSIS — Z6841 Body Mass Index (BMI) 40.0 and over, adult: Secondary | ICD-10-CM | POA: Diagnosis not present

## 2015-03-10 DIAGNOSIS — R198 Other specified symptoms and signs involving the digestive system and abdomen: Secondary | ICD-10-CM

## 2015-03-10 DIAGNOSIS — Z86010 Personal history of colonic polyps: Secondary | ICD-10-CM | POA: Insufficient documentation

## 2015-03-10 DIAGNOSIS — K219 Gastro-esophageal reflux disease without esophagitis: Secondary | ICD-10-CM | POA: Insufficient documentation

## 2015-03-10 DIAGNOSIS — Z7951 Long term (current) use of inhaled steroids: Secondary | ICD-10-CM | POA: Diagnosis not present

## 2015-03-10 DIAGNOSIS — M1990 Unspecified osteoarthritis, unspecified site: Secondary | ICD-10-CM | POA: Diagnosis not present

## 2015-03-10 DIAGNOSIS — Z79899 Other long term (current) drug therapy: Secondary | ICD-10-CM | POA: Insufficient documentation

## 2015-03-10 HISTORY — PX: COLONOSCOPY WITH PROPOFOL: SHX5780

## 2015-03-10 HISTORY — PX: ESOPHAGOGASTRODUODENOSCOPY (EGD) WITH PROPOFOL: SHX5813

## 2015-03-10 SURGERY — COLONOSCOPY WITH PROPOFOL
Anesthesia: Monitor Anesthesia Care

## 2015-03-10 MED ORDER — PROPOFOL 10 MG/ML IV BOLUS
INTRAVENOUS | Status: AC
  Filled 2015-03-10: qty 20

## 2015-03-10 MED ORDER — SODIUM CHLORIDE 0.9 % IV SOLN: INTRAVENOUS | Status: DC

## 2015-03-10 MED ORDER — LIDOCAINE HCL (PF) 2 % IJ SOLN
INTRAMUSCULAR | Status: DC | PRN
  Administered 2015-03-10: 20 mg via INTRADERMAL

## 2015-03-10 MED ORDER — LIDOCAINE HCL (CARDIAC) 20 MG/ML IV SOLN
INTRAVENOUS | Status: AC
Start: 2015-03-10 — End: 2015-03-10
  Filled 2015-03-10: qty 5

## 2015-03-10 MED ORDER — LACTATED RINGERS IV SOLN
INTRAVENOUS | Status: DC
  Administered 2015-03-10: 1000 mL via INTRAVENOUS

## 2015-03-10 MED ORDER — PROPOFOL 10 MG/ML IV BOLUS
INTRAVENOUS | Status: DC | PRN
  Administered 2015-03-10 (×2): 50 mg via INTRAVENOUS
  Administered 2015-03-10: 100 mg via INTRAVENOUS
  Administered 2015-03-10 (×3): 50 mg via INTRAVENOUS

## 2015-03-10 SURGICAL SUPPLY — 25 items

## 2015-03-10 NOTE — Anesthesia Postprocedure Evaluation (Signed)
  Anesthesia Post-op Note  Patient: [name]  Procedure(s) Performed: Procedure(s) (LRB): COLONOSCOPY WITH PROPOFOL (N/A) ESOPHAGOGASTRODUODENOSCOPY (EGD) WITH PROPOFOL (N/A)  Patient Location: PACU  Anesthesia Type: MAC  Level of Consciousness: awake and alert   Airway and Oxygen Therapy: Patient Spontanous Breathing  Post-op Pain: mild  Post-op Assessment: Post-op Vital signs reviewed, Patient's Cardiovascular Status Stable, Respiratory Function Stable, Patent Airway and No signs of Nausea or vomiting  Last Vitals:  Filed Vitals:   03/10/15 0653  BP: 144/104  Pulse: 65  Temp: 36.7 C  Resp: 15    Post-op Vital Signs: stable   Complications: No apparent anesthesia complications

## 2015-03-10 NOTE — Op Note (Signed)
[facility] [place], [address]   COLONOSCOPY PROCEDURE REPORT  PATIENT: [name], [name]  MR#: [id number] BIRTHDATE: Nov 06, 1955 , 59  yrs. old GENDER: male ENDOSCOPIST: Milus Banister, MD PROCEDURE DATE:  03/10/2015 PROCEDURE:   Colonoscopy, surveillance and Colonoscopy with snare polypectomy First Screening Colonoscopy - Avg.  risk and is 50 yrs.  old or older - No.  Prior Negative Screening - Now for repeat screening. N/A  History of Adenoma - Now for follow-up colonoscopy & has been > or = to 3 yrs.  Yes hx of adenoma.  Has been 3 or more years since last colonoscopy.  Polyps removed today? Yes ASA CLASS:   Class II INDICATIONS:Surveillance due to prior colonic neoplasia and Adenomatous polyps: Colonoscopy 08/2002 Dr.  Amedeo Plenty for hematochezia; 2 polyps removed, 1 retrieved; was TA on pathology.Marland Kitchen MEDICATIONS: Monitored anesthesia care  DESCRIPTION OF PROCEDURE:   After the risks benefits and alternatives of the procedure were thoroughly explained, informed consent was obtained.  The digital rectal exam revealed no abnormalities of the rectum.   The EC-[id number] ([id number])  endoscope was introduced through the anus and advanced to the cecum, which was identified by both the appendix and ileocecal valve. No adverse events experienced.   The quality of the prep was excellent.  The instrument was then slowly withdrawn as the colon was fully examined. Estimated blood loss is zero unless otherwise noted in this procedure report.   COLON FINDINGS: A pedunculated polyp measuring 7 mm in size was found in the sigmoid colon.  A polypectomy was performed using snare cautery.  The resection was complete, the polyp tissue was completely retrieved and sent to histology.   The examination was otherwise normal.  Retroflexed views revealed no abnormalities. The time to cecum = 6.7 Withdrawal time = 9.1   The scope was withdrawn and the procedure  completed. COMPLICATIONS: There were no immediate complications.  ENDOSCOPIC IMPRESSION: 1.   Pedunculated polyp was found in the sigmoid colon; polypectomy was performed using snare cautery 2.   The examination was otherwise normal  RECOMMENDATIONS: If the polyp(s) removed today are proven to be adenomatous (pre-cancerous) polyps, you will need a repeat colonoscopy in 5 years.  Otherwise you should continue to follow colorectal cancer screening guidelines for "routine risk" patients with colonoscopy in 10 years.  You will receive a letter within 1-2 weeks with the results of your biopsy as well as final recommendations.  Please call my office if you have not received a letter after 3 weeks.  eSigned:  Milus Banister, MD 03/10/2015 8:07 AM   cc: Tami Lin, MD

## 2015-03-10 NOTE — Discharge Instructions (Addendum)
YOU HAD AN ENDOSCOPIC PROCEDURE TODAY: Refer to the procedure report that was given to you for any specific questions about what was found during the examination.  If the procedure report does not answer your questions, please call your gastroenterologist to clarify. ° °YOU SHOULD EXPECT: Some feelings of bloating in the abdomen. Passage of more gas than usual.  Walking can help get rid of the air that was put into your GI tract during the procedure and reduce the bloating. If you had a lower endoscopy (such as a colonoscopy or flexible sigmoidoscopy) you may notice spotting of blood in your stool or on the toilet paper.  ° °DIET: Your first meal following the procedure should be a light meal and then it is ok to progress to your normal diet.  A half-sandwich or bowl of soup is an example of a good first meal.  Heavy or fried foods are harder to digest and may make you feel nasueas or bloated.  Drink plenty of fluids but you should avoid alcoholic beverages for 24 hours. ° °ACTIVITY: Your care partner should take you home directly after the procedure.  You should plan to take it easy, moving slowly for the rest of the day.  You can resume normal activity the day after the procedure however you should NOT DRIVE or use heavy machinery for 24 hours (because of the sedation medicines used during the test).   ° °SYMPTOMS TO REPORT IMMEDIATELY  °A gastroenterologist can be reached at any hour.  Please call your doctor's office for any of the following symptoms: ° °· Following lower endoscopy (colonoscopy, flexible sigmoidoscopy) ° Excessive amounts of blood in the stool ° Significant tenderness, worsening of abdominal pains ° Swelling of the abdomen that is new, acute ° Fever of 100° or higher °· Following upper endoscopy (EGD, EUS, ERCP) ° Vomiting of blood or coffee ground material ° New, significant abdominal pain ° New, significant chest pain or pain under the shoulder blades ° Painful or persistently difficult  swallowing ° New shortness of breath ° Black, tarry-looking stools ° °FOLLOW UP: °If any biopsies were taken you will be contacted by phone or by letter within the next 1-3 weeks.  Call your gastroenterologist if you have not heard about the biopsies in 3 weeks.  °Please also call your gastroenterologist's office with any specific questions about appointments or follow up tests. ° ° °Colonoscopy, Care After °These instructions give you information on caring for yourself after your procedure. Your doctor may also give you more specific instructions. Call your doctor if you have any problems or questions after your procedure. °HOME CARE °· Do not drive for 24 hours. °· Do not sign important papers or use machinery for 24 hours. °· You may shower. °· You may go back to your usual activities, but go slower for the first 24 hours. °· Take rest breaks often during the first 24 hours. °· Walk around or use warm packs on your belly (abdomen) if you have belly cramping or gas. °· Drink enough fluids to keep your pee (urine) clear or pale yellow. °· Resume your normal diet. Avoid heavy or fried foods. °· Avoid drinking alcohol for 24 hours or as told by your doctor. °· Only take medicines as told by your doctor. °If a tissue sample (biopsy) was taken during the procedure:  °· Do not take aspirin or blood thinners for 7 days, or as told by your doctor. °· Do not drink alcohol for 7 days, or   by your doctor.  Eat soft foods for the first 24 hours. GET HELP IF: You still have a small amount of blood in your poop (stool) 2-3 days after the procedure. GET HELP RIGHT AWAY IF:  You have more than a small amount of blood in your poop.  You see clumps of tissue (blood clots) in your poop.  Your belly is puffy (swollen).  You feel sick to your stomach (nauseous) or throw up (vomit).  You have a fever.  You have belly pain that gets worse and medicine does not help. MAKE SURE YOU:  Understand these  instructions.  Will watch your condition.  Will get help right away if you are not doing well or get worse. Document Released: 07/07/2010 Document Revised: 06/09/2013 Document Reviewed: 02/09/2013 [facility] Patient Information 2015 [place], [name]. This information is not intended to replace advice given to you by your health care provider. Make sure you discuss any questions you have with your health care provider. Esophagogastroduodenoscopy Care After Refer to this sheet in the next few weeks. These instructions provide you with information on caring for yourself after your procedure. Your caregiver may also give you more specific instructions. Your treatment has been planned according to current medical practices, but problems sometimes occur. Call your caregiver if you have any problems or questions after your procedure.  HOME CARE INSTRUCTIONS  Do not eat or drink anything until the numbing medicine (local anesthetic) has worn off and your gag reflex has returned. You will know that the local anesthetic has worn off when you can swallow comfortably.  Do not drive for 12 hours after the procedure or as directed by your caregiver.  Only take medicines as directed by your caregiver. SEEK MEDICAL CARE IF:   You cannot stop coughing.  You are not urinating at all or less than usual. SEEK IMMEDIATE MEDICAL CARE IF:  You have difficulty swallowing.  You cannot eat or drink.  You have worsening throat or chest pain.  You have dizziness, lightheadedness, or you faint.  You have nausea or vomiting.  You have chills.  You have a fever.  You have severe abdominal pain.  You have black, tarry, or bloody stools. Document Released: 05/21/2012 Document Reviewed: 05/21/2012 [facility] Patient Information 2015 [place]. This information is not intended to replace advice given to you by your health care provider. Make sure you discuss any questions you have with your health care  provider.  Removal of one colon polyp; will need repeat surveillance colonoscopy in 5 years.  Chew your food carefully and slow; don't talk while eating. Take small bites  Biopsy sent to pathology.   Area where lap band is is fine; area is snug enough; doesn't need to be tightened up.

## 2015-03-10 NOTE — H&P (View-Only) (Signed)
Review of pertinent gastrointestinal problems: 1. Adenomatous polyps: Colonoscopy 08/2002 Dr. Amedeo Plenty for hematochezia; 2 polyps removed, 1 retrieved; was TA on pathology. 2. Morbid obesity: s/p lap band procedure 03/2014 with moderate results (bmi 40 about year post op).  Same time 1 suture closure of HH posteriorly.   HPI: This is a  59 year old man    who was referred to me by Leandrew Koyanagi, MD  to evaluate  dysphasia .    Chief complaint is sensation in his esophagus, dysphagia like  Feels the sensation of an abrasion in his esophagus in the area of pouch, lap band.    He ate a salad last night and had gas all night.  When he   He mentioned the swallowing sensation to Dr. Hassell Done and thinks he was told to take miralax.  I saw him 2 weeks ago for different problem and I have scheduled him for colonoscopy after reviewing his outside information. He tells me that the 2000 for colonoscopy which I reviewed was not done by the physician I thought it was from and that the findings were different as well. Obviously we have incomplete, possibly inaccurate information here.  He explained he had a difficult airway intubation in the past and because of that would not be amenable to having procedure done in our endoscopy center here.   Review of systems: Pertinent positive and negative review of systems were noted in the above HPI section. Complete review of systems was performed and was otherwise normal.   Past Medical History  Diagnosis Date  . Diabetes mellitus   . Hypertension   . Rotator cuff tear   . Dyslipidemia   . Morbid obesity   . Hyperlipidemia   . Sleep apnea   . OSA (obstructive sleep apnea)   . Family history of anesthesia complication     pt states took days for his father to awaken after mask was used   . Seasonal allergies   . Shortness of breath     pt states related to high BP meds with extended walking or climbling stairs  . GERD (gastroesophageal reflux  disease)     has been having acid reflux since recent endoscopy   . H/O hiatal hernia   . Arthritis   . 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 Meniscus repair Left     left knee torn meniscus  . Vasectomy    . Cardiac catheterization      approx 3 years ago   . Colonscopy       2012  . Laparoscopic gastric banding N/A 04/05/2014    Procedure: LAPAROSCOPIC GASTRIC BANDING;  Surgeon: Pedro Earls, MD;  Location: [facility];  Service: General;  Laterality: N/A;  . Hernia repair      with gastric banding    Current Outpatient Prescriptions  Medication Sig Dispense Refill  . amLODipine (NORVASC) 5 MG tablet Take 1 tablet (5 mg total) by mouth every morning. 180 tablet 1  . Ascorbic Acid (VITAMIN C) 100 MG tablet Take 500 mg by mouth every morning.     Marland Kitchen aspirin 81 MG tablet Take 81 mg by mouth every morning.     Marland Kitchen BIOTIN PO Take by mouth daily.    . Cyanocobalamin 2500 MCG CHEW Chew 1 tablet by mouth daily.    . fluticasone (FLONASE) 50 MCG/ACT nasal spray Place 1 spray into both nostrils daily as needed for allergies or rhinitis.    Marland Kitchen  ibuprofen (ADVIL,MOTRIN) 200 MG tablet Take 400-600 mg by mouth once as needed for headache or mild pain.    . Insulin Glargine (LANTUS SOLOSTAR) 100 UNIT/ML Solostar Pen Inject 10 Units into the skin daily at 10 pm. Or at bedtime 5 pen PRN  . Insulin Pen Needle (B-D UF III MINI PEN NEEDLES) 31G X 5 MM MISC Use as directed once a day 100 each 2  . lisinopril-hydrochlorothiazide (PRINZIDE,ZESTORETIC) 20-12.5 MG per tablet Take 2 tablets by mouth every morning. 360 tablet 1  . loratadine (CLARITIN) 10 MG tablet Take 10 mg by mouth every morning.     . metFORMIN (GLUCOPHAGE-XR) 500 MG 24 hr tablet Take 500 mg by mouth daily with breakfast.    . metoprolol (LOPRESSOR) 100 MG tablet Take 1 tablet (100 mg total) by mouth every morning. 180 tablet 1  . MOVIPREP 100 G SOLR Take 1 kit (200 g total) by mouth once. 1  kit 0  . Multiple Vitamin (MULTIVITAMIN) tablet Take 1 tablet by mouth every morning.     . Probiotic Product (PROBIOTIC DAILY PO) Take by mouth.    . simvastatin (ZOCOR) 20 MG tablet Take 1 tablet (20 mg total) by mouth every morning. 180 tablet 1   No current facility-administered medications for this visit.    Allergies as of 02/10/2015 - Review Complete 02/10/2015  Allergen Reaction Noted  . Erythromycin Nausea Only 01/13/2014  . Shellfish allergy  03/26/2014    Family History  Problem Relation Age of Onset  . Hypertension Brother   . Clotting disorder Mother     lung clot  . Mesothelioma Father   . Skin cancer Brother   . Colon cancer Neg Hx     Social History   Social History  . Marital Status: Married    Spouse Name: N/A  . Number of Children: 2  . Years of Education: N/A   Occupational History  . owner     [employer]   Social History Main Topics  . Smoking status: Former Smoker -- 2.00 packs/day for 18 years    Types: Cigarettes    Start date: 07/19/1973    Quit date: 12/13/1991  . Smokeless tobacco: Never Used  . Alcohol Use: 1.8 - 3.0 oz/week    3-5 Glasses of wine per week     Comment: takes 3 to 5 glasses of wine nightly   . Drug Use: No  . Sexual Activity: Not on file   Other Topics Concern  . Not on file   Social History Narrative     Physical Exam: BP 122/60 mmHg  Pulse 60  Ht 5' 11" (1.803 m)  Wt 291 lb 4 oz (132.11 kg)  BMI 40.64 kg/m2 Constitutional: generally well-appearing Psychiatric: alert and oriented x3 Eyes: extraocular movements intact Mouth: oral pharynx moist, no lesions Neck: supple no lymphadenopathy Cardiovascular: heart regular rate and rhythm Lungs: clear to auscultation bilaterally Abdomen: soft, nontender, nondistended, no obvious ascites, no peritoneal signs, normal bowel sounds Extremities: no lower extremity edema bilaterally Skin: no lesions on visible extremities   Assessment and plan: 58 y.o. male  with  change in bowel habits, dysphagia like symptoms, previous history of colon polyps, previous difficult airway intubation.  First because of his previous difficult airway intubation he is not an appropriate candidate for outpatient endoscopy center. We will therefore cancel our previously scheduled colonoscopy for next week and rescheduled to be done at the hospital. At the same time I would like to proceed with   EGD for his dysphagia like symptoms. It does seem that since he started around the time of his lap band procedure that they are probably related to the lap band placement. I asked him if he mention this to his bariatric surgeon and he said yes and that the surgeon recommended he take MiraLAX for it. But does not really make sense to me.   Owens Loffler, MD [facility] 02/10/2015, 2:22 PM  Cc: Leandrew Koyanagi, MD

## 2015-03-10 NOTE — Interval H&P Note (Signed)
History and Physical Interval Note:  03/10/2015 7:27 AM  [name]  has presented today for surgery, with the diagnosis of dysphagia, change in bowels  The various methods of treatment have been discussed with the patient and family. After consideration of risks, benefits and other options for treatment, the patient has consented to  Procedure(s): COLONOSCOPY WITH PROPOFOL (N/A) ESOPHAGOGASTRODUODENOSCOPY (EGD) WITH PROPOFOL (N/A) as a surgical intervention .  The patient's history has been reviewed, patient examined, no change in status, stable for surgery.  I have reviewed the patient's chart and labs.  Questions were answered to the patient's satisfaction.     Milus Banister

## 2015-03-10 NOTE — Transfer of Care (Signed)
Immediate Anesthesia Transfer of Care Note  Patient: [name]  Procedure(s) Performed: Procedure(s): COLONOSCOPY WITH PROPOFOL (N/A) ESOPHAGOGASTRODUODENOSCOPY (EGD) WITH PROPOFOL (N/A)  Patient Location: PACU  Anesthesia Type:MAC  Level of Consciousness:  sedated, patient cooperative and responds to stimulation  Airway & Oxygen Therapy:Patient Spontanous Breathing and Patient connected to face mask oxgen  Post-op Assessment:  Report given to PACU RN and Post -op Vital signs reviewed and stable  Post vital signs:  Reviewed and stable  Last Vitals:  Filed Vitals:   03/10/15 0653  BP: 144/104  Pulse: 65  Temp: 36.7 C  Resp: 15    Complications: No apparent anesthesia complications

## 2015-03-10 NOTE — Anesthesia Preprocedure Evaluation (Signed)
Anesthesia Evaluation  Patient identified by MRN, date of birth, ID band Patient awake    Reviewed: Allergy & Precautions, NPO status , Patient's Chart, lab work & pertinent test results, reviewed documented beta blocker date and time   History of Anesthesia Complications (+) DIFFICULT AIRWAY, Family history of anesthesia reaction and history of anesthetic complications  Airway Mallampati: III  TM Distance: <3 FB Neck ROM: Full    Dental  (+) Teeth Intact, Dental Advisory Given   Pulmonary shortness of breath and with exertion, sleep apnea and Continuous Positive Airway Pressure Ventilation , former smoker,    Pulmonary exam normal breath sounds clear to auscultation       Cardiovascular hypertension, Pt. on medications and Pt. on home beta blockers (-) angina(-) Past MI negative cardio ROS Normal cardiovascular exam Rhythm:Regular Rate:Normal     Neuro/Psych negative neurological ROS  negative psych ROS   GI/Hepatic Neg liver ROS, hiatal hernia, GERD  Medicated,  Endo/Other  diabetes, Type 2, Insulin Dependent, Oral Hypoglycemic AgentsMorbid obesity  Renal/GU negative Renal ROS     Musculoskeletal  (+) Arthritis , Osteoarthritis,    Abdominal (+) + obese,   Peds  Hematology negative hematology ROS (+)   Anesthesia Other Findings Day of surgery medications reviewed with the patient.  Reproductive/Obstetrics                             Anesthesia Physical Anesthesia Plan  ASA: III  Anesthesia Plan: MAC   Post-op Pain Management:    Induction: Intravenous  Airway Management Planned: Nasal Cannula  Additional Equipment:   Intra-op Plan:   Post-operative Plan:   Informed Consent: I have reviewed the patients History and Physical, chart, labs and discussed the procedure including the risks, benefits and alternatives for the proposed anesthesia with the patient or authorized  representative who has indicated his/her understanding and acceptance.   Dental advisory given  Plan Discussed with: CRNA and Anesthesiologist  Anesthesia Plan Comments: (Discussed risks/benefits/alternatives to MAC sedation including need for ventilatory support, hypotension, need for conversion to general anesthesia.  All patient questions answered.  Patient wished to proceed.)        Anesthesia Quick Evaluation

## 2015-03-10 NOTE — Op Note (Signed)
[facility] [place], [address]   ENDOSCOPY PROCEDURE REPORT  PATIENT: Grayson, Pfefferle  MR#: [id number] BIRTHDATE: 06-15-1956 , 59  yrs. old GENDER: male ENDOSCOPIST: Milus Banister, MD PROCEDURE DATE:  03/10/2015 PROCEDURE:  EGD, diagnostic ASA CLASS:     Class II INDICATIONS:  dysphagia since lap band several months ago. MEDICATIONS: Monitored anesthesia care TOPICAL ANESTHETIC: none  DESCRIPTION OF PROCEDURE: After the risks benefits and alternatives of the procedure were thoroughly explained, informed consent was obtained.  The Pentax Gastroscope [id number] endoscope was introduced through the mouth and advanced to the second portion of the duodenum , Without limitations.  The instrument was slowly withdrawn as the mucosa was fully examined.    The lap band indentation was just distal to the GE junction, creating very slightly luminal narrowing of the proximal stomach. The gastroscope easily advanced through this area.  The examination was otherwise normal.  Retroflexed views revealed no abnormalities. The scope was then withdrawn from the patient and the procedure completed.  COMPLICATIONS: There were no immediate complications.  ENDOSCOPIC IMPRESSION: The lap band indentation was just distal to the GE junction, creating very slightly luminal narrowing of the proximal stomach. The gastroscope easily advanced through this area.  The examination was otherwise normal  RECOMMENDATIONS: Try to adhere to lap band dietary recommendations as best that you can.  Chew your food well, eat slowly and take small bites.   eSigned:  Milus Banister, MD 03/10/2015 8:10 AM    [id number] Laney Pastor, MD

## 2015-03-11 ENCOUNTER — Encounter (HOSPITAL_COMMUNITY): Payer: Self-pay | Admitting: Gastroenterology

## 2015-03-11 LAB — GLUCOSE, CAPILLARY: Glucose-Capillary: 131 mg/dL — ABNORMAL HIGH (ref 65–99)

## 2015-03-13 ENCOUNTER — Encounter: Payer: Self-pay | Admitting: Internal Medicine

## 2015-05-05 ENCOUNTER — Encounter: Payer: Self-pay | Admitting: Internal Medicine

## 2015-05-06 MED ORDER — AMLODIPINE BESYLATE 10 MG PO TABS: 10.0000 mg | ORAL_TABLET | ORAL | Status: DC

## 2015-05-06 MED ORDER — INSULIN GLARGINE 100 UNIT/ML SOLOSTAR PEN: 40.0000 [IU] | PEN_INJECTOR | Freq: Every day | SUBCUTANEOUS | Status: DC

## 2015-05-06 NOTE — Telephone Encounter (Signed)
Meds ordered this encounter  Medications  . Insulin Glargine (LANTUS SOLOSTAR) 100 UNIT/ML Solostar Pen    Sig: Inject 40 Units into the skin daily at 10 pm. Or at bedtime    Dispense:  5 pen    Refill:  PRN  . amLODipine (NORVASC) 10 MG tablet    Sig: Take 1 tablet (10 mg total) by mouth every morning.    Dispense:  180 tablet    Refill:  1    71mo supply

## 2015-05-07 MED ORDER — INSULIN GLARGINE 100 UNIT/ML SOLOSTAR PEN: 40.0000 [IU] | PEN_INJECTOR | Freq: Every day | SUBCUTANEOUS | Status: DC

## 2015-05-07 NOTE — Addendum Note (Signed)
Addended by: Leandrew Koyanagi on: 05/07/2015 07:31 AM   Modules accepted: Orders

## 2015-05-07 NOTE — Telephone Encounter (Signed)
Chg to 3 mo supply Meds ordered this encounter  Medications  . DISCONTD: Insulin Glargine (LANTUS SOLOSTAR) 100 UNIT/ML Solostar Pen    Sig: Inject 40 Units into the skin daily at 10 pm. Or at bedtime    Dispense:  5 pen    Refill:  PRN  . amLODipine (NORVASC) 10 MG tablet    Sig: Take 1 tablet (10 mg total) by mouth every morning.    Dispense:  180 tablet    Refill:  1    63mo supply  . Insulin Glargine (LANTUS SOLOSTAR) 100 UNIT/ML Solostar Pen    Sig: Inject 40 Units into the skin daily at 10 pm. Or at bedtime    Dispense:  15 pen    Refill:  PRN    65mo supply

## 2015-05-16 ENCOUNTER — Encounter: Payer: Self-pay | Admitting: Internal Medicine

## 2015-05-18 ENCOUNTER — Ambulatory Visit (INDEPENDENT_AMBULATORY_CARE_PROVIDER_SITE_OTHER): Payer: BLUE CROSS/BLUE SHIELD | Admitting: Internal Medicine

## 2015-05-18 VITALS — BP 119/78 | HR 71 | Temp 98.1°F | Resp 16 | Ht 71.0 in | Wt 299.0 lb

## 2015-05-18 DIAGNOSIS — E1165 Type 2 diabetes mellitus with hyperglycemia: Secondary | ICD-10-CM

## 2015-05-18 DIAGNOSIS — IMO0002 Reserved for concepts with insufficient information to code with codable children: Secondary | ICD-10-CM

## 2015-05-18 DIAGNOSIS — E1129 Type 2 diabetes mellitus with other diabetic kidney complication: Secondary | ICD-10-CM | POA: Diagnosis not present

## 2015-05-18 LAB — POCT GLYCOSYLATED HEMOGLOBIN (HGB A1C): Hemoglobin A1C: 7.8

## 2015-05-18 LAB — HEMOGLOBIN A1C: Hgb A1c MFr Bld: 7.8 % — AB (ref 4.0–6.0)

## 2015-05-19 ENCOUNTER — Encounter: Payer: Self-pay | Admitting: Family Medicine

## 2015-05-20 NOTE — Progress Notes (Signed)
   Subjective:    Patient ID: [name], male    DOB: July 12, 1955, 59 y.o.   MRN: [id number]  HPIfu for a1c Not able to lose weight tho diet better Little contr of wine still  Completed colonoscopy 9/16  Home BS around 160-180 BPs upper normal--better on incr norvasc since last ov  Outpatient Encounter Prescriptions as of 05/18/2015  Medication Sig Note  . amLODipine (NORVASC) 10 MG tablet Take 1 tablet (10 mg total) by mouth every morning.   . Ascorbic Acid (VITAMIN C) 100 MG tablet Take 500 mg by mouth every morning.    Marland Kitchen aspirin 81 MG tablet Take 81 mg by mouth every morning.  02/16/2015: -  . BIOTIN PO Take 1 tablet by mouth daily.    . Cyanocobalamin 2500 MCG CHEW Chew 1 tablet by mouth daily.   . fluticasone (FLONASE) 50 MCG/ACT nasal spray Place 1 spray into both nostrils daily as needed for allergies or rhinitis.   Marland Kitchen glucose blood (TRUETEST TEST) test strip Use as instructed   . ibuprofen (ADVIL,MOTRIN) 200 MG tablet Take 400-600 mg by mouth once as needed for headache or mild pain. 02/16/2015: -  . Insulin Glargine (LANTUS SOLOSTAR) 100 UNIT/ML Solostar Pen Inject 40 Units into the skin daily at 10 pm. Or at bedtime   . Insulin Pen Needle (B-D UF III MINI PEN NEEDLES) 31G X 5 MM MISC Use as directed once a day   . lisinopril-hydrochlorothiazide (PRINZIDE,ZESTORETIC) 20-12.5 MG per tablet Take 2 tablets by mouth every morning.   . loratadine (CLARITIN) 10 MG tablet Take 10 mg by mouth every morning.    . metFORMIN (GLUCOPHAGE-XR) 500 MG 24 hr tablet Take 500 mg by mouth 2 (two) times daily.    . metoprolol (LOPRESSOR) 100 MG tablet Take 1 tablet (100 mg total) by mouth every morning.   Marland Kitchen MOVIPREP 100 G SOLR Take 1 kit (200 g total) by mouth once. 02/16/2015: FOR THIS PROCEDURE  . Multiple Vitamin (MULTIVITAMIN) tablet Take 1 tablet by mouth every morning.    . Probiotic Product (PROBIOTIC DAILY PO) Take by mouth.   . simvastatin (ZOCOR) 20 MG tablet Take 1 tablet (20 mg  total) by mouth every morning.    No facility-administered encounter medications on file as of 05/18/2015.       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

## 2015-06-29 ENCOUNTER — Encounter: Payer: Self-pay | Admitting: Internal Medicine

## 2015-06-30 MED ORDER — METOPROLOL TARTRATE 100 MG PO TABS: 100.0000 mg | ORAL_TABLET | ORAL | Status: DC

## 2015-06-30 NOTE — Telephone Encounter (Signed)
Sent in RF and notified pt on MyChart. 

## 2015-07-01 ENCOUNTER — Encounter: Payer: Self-pay | Admitting: Internal Medicine

## 2015-07-02 ENCOUNTER — Other Ambulatory Visit: Payer: Self-pay | Admitting: Internal Medicine

## 2015-07-02 MED ORDER — LISINOPRIL-HYDROCHLOROTHIAZIDE 20-12.5 MG PO TABS: 2.0000 | ORAL_TABLET | ORAL | Status: DC

## 2015-07-02 NOTE — Telephone Encounter (Signed)
Meds ordered this encounter  Medications  . lisinopril-hydrochlorothiazide (PRINZIDE,ZESTORETIC) 20-12.5 MG tablet    Sig: Take 2 tablets by mouth every morning.    Dispense:  360 tablet    Refill:  1

## 2015-07-20 ENCOUNTER — Encounter: Payer: Self-pay | Admitting: Internal Medicine

## 2015-07-20 ENCOUNTER — Other Ambulatory Visit: Payer: Self-pay | Admitting: Internal Medicine

## 2015-07-20 MED ORDER — SIMVASTATIN 20 MG PO TABS: 20.0000 mg | ORAL_TABLET | ORAL | Status: DC

## 2015-07-20 NOTE — Telephone Encounter (Signed)
Dr Laney Pastor, you saw pt in Nov for DM, due for next f/up end of Feb, but you haven't seen pt for chol since Aug, and his triglycerides were high then. Pt wants 32 day RF and I wasn't sure if you wanted to check his lipids again now or wait for the entire year? Pended RFs. Question: Last Rx was written for, AND pt is requesting it to be written for one tablet daily with #180 - not sure why the discrepancy?

## 2015-07-21 NOTE — Telephone Encounter (Signed)
RF done. .

## 2015-07-21 NOTE — Telephone Encounter (Signed)
Dr Laney Pastor sent the refills. Notified pt on MyChart.

## 2015-07-31 ENCOUNTER — Encounter (HOSPITAL_BASED_OUTPATIENT_CLINIC_OR_DEPARTMENT_OTHER): Payer: Self-pay | Admitting: *Deleted

## 2015-07-31 ENCOUNTER — Emergency Department (HOSPITAL_BASED_OUTPATIENT_CLINIC_OR_DEPARTMENT_OTHER)
Admission: EM | Admit: 2015-07-31 | Discharge: 2015-07-31 | Disposition: A | Discharge status: Home / Self Care | Payer: BLUE CROSS/BLUE SHIELD | Attending: Emergency Medicine | Admitting: Emergency Medicine

## 2015-07-31 DIAGNOSIS — R21 Rash and other nonspecific skin eruption: Secondary | ICD-10-CM | POA: Diagnosis present

## 2015-07-31 DIAGNOSIS — E119 Type 2 diabetes mellitus without complications: Secondary | ICD-10-CM | POA: Diagnosis not present

## 2015-07-31 DIAGNOSIS — Z7984 Long term (current) use of oral hypoglycemic drugs: Secondary | ICD-10-CM | POA: Insufficient documentation

## 2015-07-31 DIAGNOSIS — Z8669 Personal history of other diseases of the nervous system and sense organs: Secondary | ICD-10-CM | POA: Diagnosis not present

## 2015-07-31 DIAGNOSIS — Z7982 Long term (current) use of aspirin: Secondary | ICD-10-CM | POA: Insufficient documentation

## 2015-07-31 DIAGNOSIS — M1990 Unspecified osteoarthritis, unspecified site: Secondary | ICD-10-CM | POA: Insufficient documentation

## 2015-07-31 DIAGNOSIS — Z8719 Personal history of other diseases of the digestive system: Secondary | ICD-10-CM | POA: Diagnosis not present

## 2015-07-31 DIAGNOSIS — L500 Allergic urticaria: Secondary | ICD-10-CM | POA: Insufficient documentation

## 2015-07-31 DIAGNOSIS — L509 Urticaria, unspecified: Secondary | ICD-10-CM

## 2015-07-31 DIAGNOSIS — Z79899 Other long term (current) drug therapy: Secondary | ICD-10-CM | POA: Diagnosis not present

## 2015-07-31 DIAGNOSIS — I10 Essential (primary) hypertension: Secondary | ICD-10-CM | POA: Diagnosis not present

## 2015-07-31 DIAGNOSIS — Z794 Long term (current) use of insulin: Secondary | ICD-10-CM | POA: Insufficient documentation

## 2015-07-31 MED ORDER — FAMOTIDINE IN NACL 20-0.9 MG/50ML-% IV SOLN
20.0000 mg | Freq: Once | INTRAVENOUS | Status: AC
  Administered 2015-07-31: 20 mg via INTRAVENOUS
  Filled 2015-07-31: qty 50

## 2015-07-31 NOTE — ED Notes (Signed)
Pt states starting having itching, then began having welts on back. States took 2 benadryl PO at approx 0900hrs today. Unknown to trigger

## 2015-07-31 NOTE — ED Notes (Signed)
MD at bedside for re-evaluation.

## 2015-07-31 NOTE — Discharge Instructions (Signed)
Hives Hives are itchy, red, swollen areas of the skin. They can vary in size and location on your body. Hives can come and go for hours or several days (acute hives) or for several weeks (chronic hives). Hives do not spread from person to person (noncontagious). They may get worse with scratching, exercise, and emotional stress. CAUSES   Allergic reaction to food, additives, or drugs.  Infections, including the common cold.  Illness, such as vasculitis, lupus, or thyroid disease.  Exposure to sunlight, heat, or cold.  Exercise.  Stress.  Contact with chemicals. SYMPTOMS   Red or white swollen patches on the skin. The patches may change size, shape, and location quickly and repeatedly.  Itching.  Swelling of the hands, feet, and face. This may occur if hives develop deeper in the skin. DIAGNOSIS  Your caregiver can usually tell what is wrong by performing a physical exam. Skin or blood tests may also be done to determine the cause of your hives. In some cases, the cause cannot be determined. TREATMENT  Mild cases usually get better with medicines such as antihistamines. Severe cases may require an emergency epinephrine injection. If the cause of your hives is known, treatment includes avoiding that trigger.  HOME CARE INSTRUCTIONS   Avoid causes that trigger your hives.  Take antihistamines as directed by your caregiver to reduce the severity of your hives. Non-sedating or low-sedating antihistamines are usually recommended. Do not drive while taking an antihistamine.  Take any other medicines prescribed for itching as directed by your caregiver.  Wear loose-fitting clothing.  Keep all follow-up appointments as directed by your caregiver. SEEK MEDICAL CARE IF:   You have persistent or severe itching that is not relieved with medicine.  You have painful or swollen joints. SEEK IMMEDIATE MEDICAL CARE IF:   You have a fever.  Your tongue or lips are swollen.  You have  trouble breathing or swallowing.  You feel tightness in the throat or chest.  You have abdominal pain. These problems may be the first sign of a life-threatening allergic reaction. Call your local emergency services (911 in [place] MAKE SURE YOU:   Understand these instructions.  Will watch your condition.  Will get help right away if you are not doing well or get worse.   This information is not intended to replace advice given to you by your health care provider. Make sure you discuss any questions you have with your health care provider.   Document Released: 06/04/2005 Document Revised: 06/09/2013 Document Reviewed: 08/28/2011 Elsevier Interactive Patient Education 2016 [employer]  

## 2015-07-31 NOTE — ED Notes (Signed)
MD at bedside. 

## 2015-07-31 NOTE — ED Notes (Signed)
Skin appears red in face, no facial swelling, no wheezing, speech wnl, no dysphagia or dysphonia

## 2015-07-31 NOTE — ED Provider Notes (Signed)
CSN: [id number]     Arrival date & time 07/31/15  1018 History   First MD Initiated Contact with Patient 07/31/15 1019     Chief Complaint  Patient presents with  . Allergic Reaction    [name] is a 60 y.o. male who presents to the emergency department after developing "welts" after eating breakfast this morning. Patient reports after eating his breakfast she noticed welts to his back, face, and abdomen around 9 am this morning or about 1.5 hours prior to evaluation. They are itchy. He also reports feeling shallow breathing initially. He reports this is resolved. No wheezing or trouble breathing. No shortness of breath. He took 50 mg of Benadryl and reports he feels his symptoms are improving. He denies hx of anaphylactic reaction. He reports known allergies to shrimp and a certain type of shampoo. He denies any known triggers. No new soaps, lotions, perfumes, detergents, plants or animals in the home. No new foods recently. He denies fevers, trouble breathing, shortness of breath, wheezing, lip swelling, tongue swelling, throat swelling, trouble swallowing, drooling, abdominal pain, vomiting, diarrhea, nausea.   Patient is a 61 y.o. male presenting with allergic reaction. The history is provided by the patient and the spouse. No language interpreter was used.  Allergic Reaction Presenting symptoms: rash   Presenting symptoms: no difficulty swallowing and no wheezing     Past Medical History  Diagnosis Date  . Diabetes mellitus   . Hypertension   . Rotator cuff tear   . Dyslipidemia   . Morbid obesity ([place])   . Hyperlipidemia   . Sleep apnea   . OSA (obstructive sleep apnea)   . Family history of anesthesia complication     pt states took days for his father to awaken after mask was used   . Seasonal allergies   . Shortness of breath     pt states related to high BP meds with extended walking or climbling stairs  . GERD (gastroesophageal reflux disease)     has been having  acid reflux since recent endoscopy   . Arthritis   . Difficult intubation 04/05/2014  . H/O hiatal hernia     repair with lap band, now has ventral hernia midline abd   Past Surgical History  Procedure Laterality Date  . Meniscus repair Left     left knee torn meniscus  . Vasectomy    . Cardiac catheterization      approx 3 years ago   . Colonscopy       2012  . Laparoscopic gastric banding N/A 04/05/2014    Procedure: LAPAROSCOPIC GASTRIC BANDING;  Surgeon: Pedro Earls, MD;  Location: [facility];  Service: General;  Laterality: N/A;  . Hernia repair      with gastric banding  . Colonoscopy with propofol N/A 03/10/2015    Procedure: COLONOSCOPY WITH PROPOFOL;  Surgeon: Milus Banister, MD;  Location: [facility];  Service: Endoscopy;  Laterality: N/A;  . Esophagogastroduodenoscopy (egd) with propofol N/A 03/10/2015    Procedure: ESOPHAGOGASTRODUODENOSCOPY (EGD) WITH PROPOFOL;  Surgeon: Milus Banister, MD;  Location: [facility];  Service: Endoscopy;  Laterality: N/A;   Family History  Problem Relation Age of Onset  . Hypertension Brother   . Clotting disorder Mother     lung clot  . Mesothelioma Father   . Skin cancer Brother   . Colon cancer Neg Hx    Social History  Substance Use Topics  . Smoking status: Former Smoker -- 2.00 packs/day  for 18 years    Types: Cigarettes    Start date: 07/19/1973    Quit date: 12/13/1991  . Smokeless tobacco: Never Used  . Alcohol Use: 1.8 - 3.0 oz/week    3-5 Glasses of wine per week     Comment: takes 3 to 5 glasses of wine nightly     Review of Systems  Constitutional: Negative for fever and chills.  HENT: Negative for congestion, drooling, facial swelling, sore throat and trouble swallowing.   Eyes: Negative for itching and visual disturbance.  Respiratory: Negative for cough, shortness of breath and wheezing.   Cardiovascular: Negative for chest pain and palpitations.  Gastrointestinal: Negative for nausea, vomiting,  abdominal pain and diarrhea.  Genitourinary: Negative for dysuria.  Musculoskeletal: Negative for back pain and neck pain.  Skin: Positive for rash.  Neurological: Negative for syncope, light-headedness and headaches.      Allergies  Erythromycin and Shellfish allergy  Home Medications   Prior to Admission medications   Medication Sig Start Date End Date Taking? Authorizing Provider  amLODipine (NORVASC) 10 MG tablet Take 1 tablet (10 mg total) by mouth every morning. 05/06/15  Yes Leandrew Koyanagi, MD  Ascorbic Acid (VITAMIN C) 100 MG tablet Take 500 mg by mouth every morning.    Yes Historical Provider, MD  aspirin 81 MG tablet Take 81 mg by mouth every morning.    Yes Historical Provider, MD  fluticasone (FLONASE) 50 MCG/ACT nasal spray Place 1 spray into both nostrils daily as needed for allergies or rhinitis.   Yes Historical Provider, MD  glucose blood (TRUETEST TEST) test strip Use as instructed 03/05/15  Yes Leandrew Koyanagi, MD  ibuprofen (ADVIL,MOTRIN) 200 MG tablet Take 400-600 mg by mouth once as needed for headache or mild pain.   Yes Historical Provider, MD  Insulin Glargine (LANTUS SOLOSTAR) 100 UNIT/ML Solostar Pen Inject 40 Units into the skin daily at 10 pm. Or at bedtime 05/07/15  Yes Leandrew Koyanagi, MD  lisinopril-hydrochlorothiazide (PRINZIDE,ZESTORETIC) 20-12.5 MG tablet Take 2 tablets by mouth every morning. 07/02/15  Yes Leandrew Koyanagi, MD  metFORMIN (GLUCOPHAGE-XR) 500 MG 24 hr tablet Take 500 mg by mouth 2 (two) times daily.    Yes Historical Provider, MD  metoprolol (LOPRESSOR) 100 MG tablet Take 1 tablet (100 mg total) by mouth every morning. 06/30/15  Yes Leandrew Koyanagi, MD  MOVIPREP 100 G SOLR Take 1 kit (200 g total) by mouth once. 02/09/15  Yes Milus Banister, MD  Multiple Vitamin (MULTIVITAMIN) tablet Take 1 tablet by mouth every morning.    Yes Historical Provider, MD  Probiotic Product (PROBIOTIC DAILY PO) Take by mouth.   Yes Historical  Provider, MD  simvastatin (ZOCOR) 20 MG tablet Take 1 tablet (20 mg total) by mouth every morning. 07/20/15  Yes Leandrew Koyanagi, MD  BIOTIN PO Take 1 tablet by mouth daily.     Historical Provider, MD  Cyanocobalamin 2500 MCG CHEW Chew 1 tablet by mouth daily.    Historical Provider, MD  Insulin Pen Needle (B-D UF III MINI PEN NEEDLES) 31G X 5 MM MISC Use as directed once a day 02/09/15   Leandrew Koyanagi, MD  loratadine (CLARITIN) 10 MG tablet Take 10 mg by mouth every morning.     Historical Provider, MD   BP 134/89 mmHg  Pulse 72  Temp(Src) 97.8 F (36.6 C) (Oral)  Resp 18  Ht 6' (1.829 m)  Wt 136.079 kg  BMI 40.68 kg/m2  SpO2 97% Physical Exam  Constitutional: He is oriented to person, place, and time. He appears well-developed and well-nourished. No distress.  Nontoxic-appearing.  HENT:  Head: Normocephalic and atraumatic.  Right Ear: External ear normal.  Left Ear: External ear normal.  Mouth/Throat: Oropharynx is clear and moist. No oropharyngeal exudate.  No lip or tongue swelling. No drooling.  Eyes: Conjunctivae are normal. Pupils are equal, round, and reactive to light. Right eye exhibits no discharge. Left eye exhibits no discharge.  Neck: Normal range of motion. Neck supple. No JVD present. No tracheal deviation present.  Cardiovascular: Normal rate, regular rhythm, normal heart sounds and intact distal pulses.  Exam reveals no gallop and no friction rub.   No murmur heard. Pulmonary/Chest: Effort normal and breath sounds normal. No respiratory distress. He has no wheezes. He has no rales.  Lungs are clear to auscultation bilaterally.  Abdominal: Soft. Bowel sounds are normal. There is no tenderness. There is no guarding.  Musculoskeletal: He exhibits no edema.  Lymphadenopathy:    He has no cervical adenopathy.  Neurological: He is alert and oriented to person, place, and time. Coordination normal.  Skin: Skin is warm and dry. Rash noted. He is not diaphoretic. No  erythema. No pallor.  Faint urticaria noted to the patient's back and abdomen. No rashes noted to his face or legs. No vesicles or bulla.   Psychiatric: He has a normal mood and affect. His behavior is normal.  Nursing note and vitals reviewed.   ED Course  Procedures (including critical care time) Labs Review Labs Reviewed - No data to display  Imaging Review No results found. I have personally reviewed and evaluated these images and lab results as part of my medical decision-making.   EKG Interpretation None      Filed Vitals:   07/31/15 1020 07/31/15 1206  BP: 159/92 134/89  Pulse: 73 72  Temp: 97.8 F (36.6 C)   TempSrc: Oral   Resp: 20 18  Height: 6' (1.829 m)   Weight: 136.079 kg   SpO2: 98% 97%     MDM   Meds given in ED:  Medications  famotidine (PEPCID) IVPB 20 mg premix (0 mg Intravenous Stopped 07/31/15 1112)    New Prescriptions   No medications on file    Final diagnoses:  Urticaria   This  is a 60 y.o. male who presents to the emergency department after developing "welts" after eating breakfast this morning. Patient reports after eating his breakfast she noticed welts to his back, face, and abdomen around 9 am this morning or about 1.5 hours prior to evaluation. They are itchy. He also reports feeling shallow breathing initially. He reports this is resolved. No wheezing or trouble breathing. No shortness of breath. He took 50 mg of Benadryl and reports he feels his symptoms are improving. He denies hx of anaphylactic reaction. He reports known allergies to shrimp and a certain type of shampoo. He denies any known triggers. No new soaps, lotions, perfumes, detergents, plants or animals in the home. No new foods recently. He denies fevers, trouble breathing, shortness of breath, wheezing, lip swelling, tongue swelling, throat swelling, trouble swallowing. On exam the patient is afebrile nontoxic appearing. No tongue or lip swelling. No drooling. Lungs are  clear to auscultation bilaterally. Patient does have several areas of urticaria noted to his abdomen and back. No other rashes noted. No vesicles or bulla. No trouble breathing. As the patient is R Eddie received 50 mg of Benadryl at home,  I will provide with Pepcid and reevaluate. At reevaluation, patient reports feeling much better. He has no itching. He still has slight urticaria. It is not worsened or changed. He denies any trouble breathing or swallowing. He denies any tongue or lip swelling. Will discharge with strict and specific return precautions. I advised he can continue using Benadryl as needed for any urticaria. I advised the patient to follow-up with their primary care provider this week. I advised the patient to return to the emergency department with new or worsening symptoms or new concerns. The patient verbalized understanding and agreement with plan.       Waynetta Pean, PA-C 07/31/15 1216  Gareth Morgan, MD 08/01/15 1659

## 2015-07-31 NOTE — ED Notes (Signed)
Speech wnl, no lip/ tongue swelling noted

## 2015-08-10 ENCOUNTER — Encounter: Payer: Self-pay | Admitting: Internal Medicine

## 2015-08-10 ENCOUNTER — Other Ambulatory Visit: Payer: Self-pay | Admitting: Internal Medicine

## 2015-08-11 NOTE — Telephone Encounter (Signed)
Dr Laney Pastor, also forwarding a refill req from pharm for this.

## 2015-08-11 NOTE — Telephone Encounter (Signed)
Dr Laney Pastor, I was going to refuse this per protocol, but see Pt sent you an email about this, so will send this and please see email also.

## 2015-08-17 ENCOUNTER — Ambulatory Visit: Payer: BLUE CROSS/BLUE SHIELD | Admitting: Internal Medicine

## 2015-08-25 ENCOUNTER — Encounter: Payer: Self-pay | Admitting: Internal Medicine

## 2015-08-27 ENCOUNTER — Encounter: Payer: Self-pay | Admitting: Internal Medicine

## 2015-08-29 MED ORDER — INSULIN GLARGINE 100 UNIT/ML SOLOSTAR PEN: 60.0000 [IU] | PEN_INJECTOR | Freq: Every day | SUBCUTANEOUS | Status: DC

## 2015-09-07 ENCOUNTER — Ambulatory Visit (INDEPENDENT_AMBULATORY_CARE_PROVIDER_SITE_OTHER): Payer: BLUE CROSS/BLUE SHIELD | Admitting: Internal Medicine

## 2015-09-07 ENCOUNTER — Encounter: Payer: Self-pay | Admitting: Internal Medicine

## 2015-09-07 VITALS — BP 125/82 | HR 72 | Temp 98.5°F | Resp 16 | Ht 72.0 in | Wt 301.0 lb

## 2015-09-07 DIAGNOSIS — E785 Hyperlipidemia, unspecified: Secondary | ICD-10-CM | POA: Diagnosis not present

## 2015-09-07 DIAGNOSIS — IMO0002 Reserved for concepts with insufficient information to code with codable children: Secondary | ICD-10-CM

## 2015-09-07 DIAGNOSIS — Z794 Long term (current) use of insulin: Secondary | ICD-10-CM

## 2015-09-07 DIAGNOSIS — E1165 Type 2 diabetes mellitus with hyperglycemia: Secondary | ICD-10-CM

## 2015-09-07 DIAGNOSIS — E1310 Other specified diabetes mellitus with ketoacidosis without coma: Secondary | ICD-10-CM | POA: Diagnosis not present

## 2015-09-07 DIAGNOSIS — Z6841 Body Mass Index (BMI) 40.0 and over, adult: Secondary | ICD-10-CM

## 2015-09-07 DIAGNOSIS — I10 Essential (primary) hypertension: Secondary | ICD-10-CM

## 2015-09-07 DIAGNOSIS — E1129 Type 2 diabetes mellitus with other diabetic kidney complication: Secondary | ICD-10-CM | POA: Diagnosis not present

## 2015-09-07 DIAGNOSIS — E1110 Type 2 diabetes mellitus with ketoacidosis without coma: Secondary | ICD-10-CM

## 2015-09-07 LAB — POCT GLYCOSYLATED HEMOGLOBIN (HGB A1C): Hemoglobin A1C: 7.8

## 2015-09-07 MED ORDER — VERAPAMIL HCL ER 240 MG PO TBCR: 240.0000 mg | EXTENDED_RELEASE_TABLET | Freq: Every day | ORAL | Status: DC

## 2015-09-07 NOTE — Patient Instructions (Signed)
     IF you received an x-ray today, you will receive an invoice from [facility]. Please contact [facility] at [phone] with questions or concerns regarding your invoice.   IF you received labwork today, you will receive an invoice from [facility]. Please contact [facility] at [phone] with questions or concerns regarding your invoice.   Our billing staff will not be able to assist you with questions regarding bills from these companies.  You will be contacted with the lab results as soon as they are available. The fastest way to get your results is to activate your My Chart account. Instructions are located on the last page of this paperwork. If you have not heard from us regarding the results in 2 weeks, please contact this office.      

## 2015-09-08 ENCOUNTER — Telehealth: Payer: Self-pay

## 2015-09-08 LAB — LIPID PANEL
Cholesterol: 206 mg/dL — ABNORMAL HIGH (ref 125–200)
HDL: 40 mg/dL (ref >=40)
Total CHOL/HDL Ratio: 5.2 Ratio — ABNORMAL HIGH (ref <=5.0)
Triglycerides: 540 mg/dL — ABNORMAL HIGH (ref <150)

## 2015-09-08 LAB — COMPREHENSIVE METABOLIC PANEL
ALT: 36 U/L (ref 9–46)
AST: 40 U/L — ABNORMAL HIGH (ref 10–35)
Albumin: 4.2 g/dL (ref 3.6–5.1)
Alkaline Phosphatase: 43 U/L (ref 40–115)
BUN: 14 mg/dL (ref 7–25)
CO2: 28 mmol/L (ref 20–31)
Calcium: 9 mg/dL (ref 8.6–10.3)
Chloride: 95 mmol/L — ABNORMAL LOW (ref 98–110)
Creat: 1.17 mg/dL (ref 0.70–1.25)
Glucose, Bld: 164 mg/dL — ABNORMAL HIGH (ref 65–99)
Potassium: 3.5 mmol/L (ref 3.5–5.3)
Sodium: 136 mmol/L (ref 135–146)
Total Bilirubin: 0.5 mg/dL (ref 0.2–1.2)
Total Protein: 6.7 g/dL (ref 6.1–8.1)

## 2015-09-08 NOTE — Progress Notes (Signed)
Subjective:    Patient ID: [name], male    DOB: 28-Jul-1955, 60 y.o.   MRN: [id number]  HPIf/u DM2 Has been adjusting Lantus and now up to 50 with improved home readings. Unfortunately, weight is unchanged. He continues to drink 4-6 glasses of wine every night which he thoroughly enjoys which he claims will help him to sleep better in which he has done for many many years.  Post lap band he continues with tremendous gas and irregular large BMs.  Patient Active Problem List   Diagnosis Date Noted  . DM type 2, uncontrolled, with renal complications ([place]) 79/48/0165    Priority: High  . BMI 40.0-44.9, adult ([place]) 08/29/2011    Priority: High  . Back pain 12/26/2011    Priority: Medium  . OSA (obstructive sleep apnea) 12/26/2011    Priority: Medium  . Cervical disc disease 12/26/2011    Priority: Medium  . Hypertension 08/29/2011    Priority: Medium  . Hyperlipidemia 08/29/2011    Priority: Medium  . Lapband APS + hiatal hernia repair Oct 2015 04/05/2014  . Metabolic syndrome X 53/74/8270  . Hearing loss-aides 03/26/2012   Outpatient Encounter Prescriptions as of 09/07/2015  Medication Sig Note  . amLODipine (NORVASC) 10 MG tablet Take 1 tablet (10 mg total) by mouth every morning.   . Ascorbic Acid (VITAMIN C) 100 MG tablet Take 500 mg by mouth 3 (three) times daily.    Marland Kitchen aspirin 81 MG tablet Take 81 mg by mouth every morning.  02/16/2015: -  . BIOTIN PO Take 1 tablet by mouth daily.    . Cyanocobalamin 2500 MCG CHEW Chew 1 tablet by mouth daily.   . fluticasone (FLONASE) 50 MCG/ACT nasal spray Place 1 spray into both nostrils daily as needed for allergies or rhinitis.   Marland Kitchen glucose blood (TRUETEST TEST) test strip Use as instructed   . ibuprofen (ADVIL,MOTRIN) 200 MG tablet Take 400-600 mg by mouth once as needed for headache or mild pain. 02/16/2015: -  . Insulin Glargine (LANTUS SOLOSTAR) 100 UNIT/ML Solostar Pen Inject 50 Units into the skin daily at 10 pm. Or at  bedtime   . Insulin Pen Needle (B-D UF III MINI PEN NEEDLES) 31G X 5 MM MISC Use as directed once a day   . lisinopril-hydrochlorothiazide (PRINZIDE,ZESTORETIC) 20-12.5 MG tablet Take 2 tablets by mouth every morning.   . loratadine (CLARITIN) 10 MG tablet Take 10 mg by mouth every morning.    . metFORMIN (GLUCOPHAGE-XR) 500 MG 24 hr tablet Take 500 mg by mouth 2 (two) times daily.    . metoprolol (LOPRESSOR) 100 MG tablet Take 1 tablet (100 mg total) by mouth every morning.   Marland Kitchen MOVIPREP 100 G SOLR Take 1 kit (200 g total) by mouth once. 02/16/2015: FOR THIS PROCEDURE  . Multiple Vitamin (MULTIVITAMIN) tablet Take 1 tablet by mouth every morning.    . Probiotic Product (PROBIOTIC DAILY PO) Take by mouth.   . simvastatin (ZOCOR) 20 MG tablet Take 1 tablet (20 mg total) by mouth every morning.   . verapamil (CALAN-SR) 240 MG CR tablet Take 1 tablet (240 mg total) by mouth at bedtime.   . [DISCONTINUED] azithromycin (ZITHROMAX) 250 MG tablet TAKE 2 TABLETS NOW, THEN 1 TABLET EACH DAY UNTIL FINISHED    No facility-administered encounter medications on file as of 09/07/2015.   He has had trouble with intermittent edema in the lower extremities without shortness of breath or chest pain or change in activity level  This seems to parallel his increase in amlodipine over the past several months  Review of Systems No chest pain or palpitations His sensitivity to light touch on his plantar aspects of both feet has resolved with better glucose control No headaches or vision changes No chest pain or palpitations No abdominal pain/no loss of appetite Still struggles with obstructive sleep apnea and can't tolerate mask Sleeps 4-5 hours and then wakes to urinate but has trouble going back to sleep often until 6 AM//this is been his pattern for 15 or 20 years He recognizes that he should not use sleep aids because of his sleep apnea 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 He used  physical therapist Bjorn Loser for several years with great success    Objective:   Physical Exam BP 125/82 mmHg  Pulse 72  Temp(Src) 98.5 F (36.9 C)  Resp 16  Ht 6' (1.829 m)  Wt 301 lb (136.533 kg)  BMI 40.81 kg/m2 PERRLA with EOMs conjugate  Range of motion of neck decreased by known degenerative disease  Heart regular without murmur 2+ edema of ankles Sensation intact on his feet Resolving ingrown toenail on the left great toe Cranial nerves II through XII intact Gait normal Mood good/ affect appropriate   Results for orders placed or performed in visit on 09/07/15  POCT glycosylated hemoglobin (Hb A1C)  Result Value Ref Range   Hemoglobin A1C 7.8    Represents improvement    Assessment & Plan:  Uncontrolled type 2 diabetes mellitus with ketoacidosis without coma, with long-term current use of insulin ([place]) - Plan: Comprehensive metabolic panel, Lipid panel He will continue to slowly increase Lantus/continue metformin  BMI 40.0-44.9, adult ([facility])  Essential hypertension  Hyperlipidemia with hypertriglyceridemia--on simvastatin 20 mg  Edema secondary to amlodipine--will change to verapamil  After his lipid profile is completely we will also change simvastatin to Pravachol to lessen the risk of myopathy as a secondary effect of the combination of calcium channel blockers and simvastatin//he needs to focus on alcohol reduction in order to control his triglycerides although improving diabetic control will also help this//he needs to work on weight loss seriously  We discussed options follow-up

## 2015-09-08 NOTE — Telephone Encounter (Signed)
[facility] pharmacy called regarding verapamil and simvastatin. Pharmacy said when a patient is taking verapamil they usually drop the simvastatin dose to 10mg . Pharmacy wanted to know if you wanted to keep simvastatin at 20mg  or drop it to 10mg ?

## 2015-09-08 NOTE — Telephone Encounter (Signed)
Thanks for the call---Tell them I am waiting on his lipid profile--I'm going to lessen his risk of the combo causing myopathy by changing him to pravachol but need to see what dose he needs

## 2015-09-09 ENCOUNTER — Other Ambulatory Visit: Payer: Self-pay | Admitting: Internal Medicine

## 2015-09-09 MED ORDER — PRAVASTATIN SODIUM 40 MG PO TABS: 40.0000 mg | ORAL_TABLET | Freq: Every day | ORAL | Status: DC

## 2015-09-09 NOTE — Telephone Encounter (Signed)
Pharmacy notified.

## 2015-09-22 ENCOUNTER — Encounter: Payer: Self-pay | Admitting: Internal Medicine

## 2015-09-22 DIAGNOSIS — E1165 Type 2 diabetes mellitus with hyperglycemia: Secondary | ICD-10-CM

## 2015-09-22 DIAGNOSIS — R0600 Dyspnea, unspecified: Secondary | ICD-10-CM

## 2015-09-22 DIAGNOSIS — Z794 Long term (current) use of insulin: Secondary | ICD-10-CM

## 2015-09-22 DIAGNOSIS — I10 Essential (primary) hypertension: Secondary | ICD-10-CM

## 2015-09-22 DIAGNOSIS — R609 Edema, unspecified: Secondary | ICD-10-CM

## 2015-09-27 MED ORDER — NIFEDIPINE ER 60 MG PO TB24: 60.0000 mg | ORAL_TABLET | Freq: Every day | ORAL | Status: DC

## 2015-09-27 NOTE — Telephone Encounter (Signed)
At this point you should be on Lisin/HCT20/12.5x 2 daily, verapamil 240SR, and lopressor 100 No longer on norvasc This is max and not working so implies that your breathing issues might be coming from plaque in your coronary arteries--I need for cardiology to see if time for a stress test--Did you say you have seen Hank Tamala Julian? Or do you have a preference??

## 2015-09-27 NOTE — Telephone Encounter (Signed)
Nifedipine for verap as it was not as effec as norvasc---? Will it cause edema like norvasc--if so could change lisin hct to ARB/hct as those affect renal to offset edema due to Ca Ch blockers

## 2015-10-06 NOTE — Addendum Note (Signed)
Addended by: Leandrew Koyanagi on: 10/06/2015 03:08 PM   Modules accepted: Orders

## 2015-10-26 ENCOUNTER — Ambulatory Visit: Payer: BLUE CROSS/BLUE SHIELD | Admitting: Interventional Cardiology

## 2015-10-29 ENCOUNTER — Other Ambulatory Visit: Payer: Self-pay | Admitting: Internal Medicine

## 2015-11-03 DIAGNOSIS — U099 Post covid-19 condition, unspecified: Secondary | ICD-10-CM | POA: Insufficient documentation

## 2015-11-03 DIAGNOSIS — R0609 Other forms of dyspnea: Secondary | ICD-10-CM | POA: Insufficient documentation

## 2015-11-03 DIAGNOSIS — R0600 Dyspnea, unspecified: Secondary | ICD-10-CM | POA: Insufficient documentation

## 2015-11-04 ENCOUNTER — Ambulatory Visit (INDEPENDENT_AMBULATORY_CARE_PROVIDER_SITE_OTHER): Payer: BLUE CROSS/BLUE SHIELD | Admitting: Interventional Cardiology

## 2015-11-04 ENCOUNTER — Encounter: Payer: Self-pay | Admitting: Interventional Cardiology

## 2015-11-04 VITALS — BP 116/78 | HR 68 | Ht 72.0 in | Wt 305.0 lb

## 2015-11-04 DIAGNOSIS — E1129 Type 2 diabetes mellitus with other diabetic kidney complication: Secondary | ICD-10-CM | POA: Diagnosis not present

## 2015-11-04 DIAGNOSIS — G4733 Obstructive sleep apnea (adult) (pediatric): Secondary | ICD-10-CM

## 2015-11-04 DIAGNOSIS — R0600 Dyspnea, unspecified: Secondary | ICD-10-CM

## 2015-11-04 DIAGNOSIS — E785 Hyperlipidemia, unspecified: Secondary | ICD-10-CM

## 2015-11-04 DIAGNOSIS — I10 Essential (primary) hypertension: Secondary | ICD-10-CM | POA: Diagnosis not present

## 2015-11-04 DIAGNOSIS — E1165 Type 2 diabetes mellitus with hyperglycemia: Secondary | ICD-10-CM

## 2015-11-04 DIAGNOSIS — IMO0002 Reserved for concepts with insufficient information to code with codable children: Secondary | ICD-10-CM

## 2015-11-04 MED ORDER — NIFEDIPINE ER 60 MG PO TB24: 60.0000 mg | ORAL_TABLET | Freq: Every day | ORAL | Status: DC

## 2015-11-04 NOTE — Addendum Note (Signed)
Addended by: Jannette Spanner on: 11/04/2015 02:03 PM   Modules accepted: Orders

## 2015-11-04 NOTE — Patient Instructions (Signed)
Medication Instructions:  Your physician recommends that you continue on your current medications as directed. Please refer to the Current Medication list given to you today.   Labwork: -None  Testing/Procedures: Your physician has requested that you have a lexiscan myoview. For further information please visit [email or web address]. Please follow instruction sheet, as given.     Follow-Up: To be determined following test results  Any Other Special Instructions Will Be Listed Below (If Applicable).     If you need a refill on your cardiac medications before your next appointment, please call your pharmacy.

## 2015-11-04 NOTE — Progress Notes (Signed)
Cardiology Office Note    Date:  11/04/2015   ID:  [name], DOB 02/26/1956, MRN [id number]  PCP:  Leandrew Koyanagi, MD  Cardiologist: Sinclair Grooms, MD   Chief Complaint  Patient presents with  . Shortness of Breath    History of Present Illness:  [name] is a 60 y.o. male for evaluation of dyspnea on exertion.  He has a history of multiple risk factors for coronary disease. These include obesity, struck to sleep apnea, hypertension, dyslipidemia, and family history of vascular disease. He is referred by Dr. Laney Pastor because of a complaint of exertional dyspnea with as low as 100 yards of walking. There is no associated discomfort. He denies orthopnea and PND. He has lower extremity swelling which is probably related to amlodipine and nifedipine. He denies orthopnea, PND, and tachycardia. He has not had syncope. He is relatively inactive.    Past Medical History  Diagnosis Date  . Diabetes mellitus   . Hypertension   . Rotator cuff tear   . Dyslipidemia   . Morbid obesity ([place])   . Hyperlipidemia   . Sleep apnea   . OSA (obstructive sleep apnea)   . Family history of anesthesia complication     pt states took days for his father to awaken after mask was used   . Seasonal allergies   . Shortness of breath     pt states related to high BP meds with extended walking or climbling stairs  . GERD (gastroesophageal reflux disease)     has been having acid reflux since recent endoscopy   . Arthritis   . Difficult intubation 04/05/2014  . H/O hiatal hernia     repair with lap band, now has ventral hernia midline abd    Past Surgical History  Procedure Laterality Date  . Meniscus repair Left     left knee torn meniscus  . Vasectomy    . Cardiac catheterization      approx 3 years ago   . Colonscopy       2012  . Laparoscopic gastric banding N/A 04/05/2014    Procedure: LAPAROSCOPIC GASTRIC BANDING;  Surgeon: Pedro Earls, MD;  Location: [facility];  Service: General;  Laterality: N/A;  . Hernia repair      with gastric banding  . Colonoscopy with propofol N/A 03/10/2015    Procedure: COLONOSCOPY WITH PROPOFOL;  Surgeon: Milus Banister, MD;  Location: [facility];  Service: Endoscopy;  Laterality: N/A;  . Esophagogastroduodenoscopy (egd) with propofol N/A 03/10/2015    Procedure: ESOPHAGOGASTRODUODENOSCOPY (EGD) WITH PROPOFOL;  Surgeon: Milus Banister, MD;  Location: [facility];  Service: Endoscopy;  Laterality: N/A;    Current Medications: Outpatient Prescriptions Prior to Visit  Medication Sig Dispense Refill  . amLODipine (NORVASC) 10 MG tablet Take 1 tablet (10 mg total) by mouth every morning. 180 tablet 1  . Ascorbic Acid (VITAMIN C) 100 MG tablet Take 500 mg by mouth 3 (three) times daily.     Marland Kitchen aspirin 81 MG tablet Take 81 mg by mouth every morning.     Marland Kitchen BIOTIN PO Take 1 tablet by mouth daily.     . Cyanocobalamin 2500 MCG CHEW Chew 1 tablet by mouth daily.    . fluticasone (FLONASE) 50 MCG/ACT nasal spray Place 1 spray into both nostrils daily as needed for allergies or rhinitis.    Marland Kitchen glucose blood (TRUETEST TEST) test strip Use as instructed 100 each 2  .  ibuprofen (ADVIL,MOTRIN) 200 MG tablet Take 400-600 mg by mouth once as needed for headache or mild pain.    . Insulin Pen Needle (B-D UF III MINI PEN NEEDLES) 31G X 5 MM MISC Use as directed once a day 100 each 2  . lisinopril-hydrochlorothiazide (PRINZIDE,ZESTORETIC) 20-12.5 MG tablet Take 2 tablets by mouth every morning. 360 tablet 1  . loratadine (CLARITIN) 10 MG tablet Take 10 mg by mouth every morning.     . metoprolol (LOPRESSOR) 100 MG tablet Take 1 tablet (100 mg total) by mouth every morning. 180 tablet 0  . Multiple Vitamin (MULTIVITAMIN) tablet Take 1 tablet by mouth every morning.     Marland Kitchen NIFEdipine (PROCARDIA XL/ADALAT-CC) 60 MG 24 hr tablet TAKE 1 TABLET (60 MG TOTAL) BY MOUTH DAILY. 30 tablet 3  . Probiotic Product (PROBIOTIC DAILY PO) Take 1 capsule  by mouth daily.     . pravastatin (PRAVACHOL) 40 MG tablet Take 1 tablet (40 mg total) by mouth daily. (Patient not taking: Reported on 11/04/2015) 90 tablet 3  . Insulin Glargine (LANTUS SOLOSTAR) 100 UNIT/ML Solostar Pen Inject 60 Units into the skin daily at 10 pm. Or at bedtime (Patient not taking: Reported on 11/04/2015) 15 pen PRN  . metFORMIN (GLUCOPHAGE-XR) 500 MG 24 hr tablet Take 500 mg by mouth 2 (two) times daily. Reported on 11/04/2015    . MOVIPREP 100 G SOLR Take 1 kit (200 g total) by mouth once. (Patient not taking: Reported on 11/04/2015) 1 kit 0  . verapamil (CALAN-SR) 240 MG CR tablet Take 1 tablet (240 mg total) by mouth at bedtime. (Patient not taking: Reported on 11/04/2015) 90 tablet 3   No facility-administered medications prior to visit.     Allergies:   Erythromycin and Shellfish allergy   Social History   Social History  . Marital Status: Married    Spouse Name: N/A  . Number of Children: 2  . Years of Education: N/A   Occupational History  . owner     [place] History Main Topics  . Smoking status: Former Smoker -- 2.00 packs/day for 18 years    Types: Cigarettes    Start date: 07/19/1973    Quit date: 12/13/1991  . Smokeless tobacco: Never Used  . Alcohol Use: 1.8 - 3.0 oz/week    3-5 Glasses of wine per week     Comment: takes 3 to 5 glasses of wine nightly   . Drug Use: No  . Sexual Activity: Not Asked   Other Topics Concern  . None   Social History Narrative     Family History:  The patient's family history includes Clotting disorder in his mother; Hypertension in his brother; Mesothelioma in his father; Skin cancer in his brother. There is no history of Colon cancer.   ROS:   Please see the history of present illness.    Back discomfort, muscle pain,  All other systems reviewed and are negative.   PHYSICAL EXAM:   VS:  BP 116/78 mmHg  Pulse 68  Ht 6' (1.829 m)  Wt 305 lb (138.347 kg)  BMI 41.36 kg/m2   GEN: Well  nourished, well developed, in no acute distress HEENT: normal Neck: no JVD, carotid bruits, or masses Cardiac: RRR; no murmurs, rubs, or gallops,no edema  Respiratory:  clear to auscultation bilaterally, normal work of breathing GI: soft, nontender, nondistended, + BS MS: no deformity or atrophy Skin: warm and dry, no rash Neuro:  Alert and Oriented x  3, Strength and sensation are intact Psych: euthymic mood, full affect  Wt Readings from Last 3 Encounters:  11/04/15 305 lb (138.347 kg)  09/07/15 301 lb (136.533 kg)  07/31/15 300 lb (136.079 kg)      Studies/Labs Reviewed:   EKG:  EKG  Is performed and demonstrates normal sinus rhythm and normal appearance.  Recent Labs: 11/24/2014: Hemoglobin 15.0; Platelets 196; TSH 1.408 09/07/2015: ALT 36; BUN 14; Creat 1.17; Potassium 3.5; Sodium 136   Lipid Panel    Component Value Date/Time   CHOL 206* 09/07/2015 1802   TRIG 540* 09/07/2015 1802   HDL 40 09/07/2015 1802   CHOLHDL 5.2* 09/07/2015 1802   VLDL NOT CALC 09/07/2015 1802   Roberts NOT CALC 09/07/2015 1802    Additional studies/ records that were reviewed today include:  Previous nuclear perfusion study had abnormality noted. Coronary angiography demonstrated patent coronaries and suggested that the nuclear study was falsely positive. This test was done greater than 5 years ago.    ASSESSMENT:    1. Dyspnea   2. Essential hypertension   3. Hyperlipidemia   4. 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 OSA (obstructive sleep apnea)      PLAN:  In order of problems listed above:  1. Dyspnea could be multifactorial. In the setting of poorly controlled diabetes, coronary disease anginal equivalent needs to be excluded with a repeat myocardial perfusion study and comparison with old study. 2. Blood pressures were well controlled. Lower extremity edema is likely related to the combination of amlodipine and nifedipine. I  discussed this with the patient. There may also be a component related to his obstructive sleep apnea. 3. Lipids are terribly controlled. He may need a fibroid added to statin therapy to get triglyceride levels down. He has not yet started pravastatin which Dr. Laney Pastor prescribed. 4. Diabetes with A1c of 7.8 is not under great control. He needs to increase aerobic activity to help with management. 5. Sleep apnea is being treated with CPAP. He has had a recent update in [employer].    Medication Adjustments/Labs and Tests Ordered: Current medicines are reviewed at length with the patient today.  Concerns regarding medicines are outlined above.  Medication changes, Labs and Tests ordered today are listed in the Patient Instructions below. Patient Instructions  Medication Instructions:  Your physician recommends that you continue on your current medications as directed. Please refer to the Current Medication list given to you today.   Labwork: -None  Testing/Procedures: Your physician has requested that you have a lexiscan myoview. For further information please visit [email or web address]. Please follow instruction sheet, as given.     Follow-Up: To be determined following test results  Any Other Special Instructions Will Be Listed Below (If Applicable).     If you need a refill on your cardiac medications before your next appointment, please call your pharmacy.       Signed, Sinclair Grooms, MD  11/04/2015 10:02 AM    [facility] [address], [place], [place]  [address] Phone: [phone]; Fax: [phone]

## 2015-11-07 ENCOUNTER — Ambulatory Visit (HOSPITAL_COMMUNITY): Payer: BLUE CROSS/BLUE SHIELD | Attending: Cardiology

## 2015-11-07 DIAGNOSIS — IMO0002 Reserved for concepts with insufficient information to code with codable children: Secondary | ICD-10-CM

## 2015-11-07 DIAGNOSIS — E785 Hyperlipidemia, unspecified: Secondary | ICD-10-CM | POA: Diagnosis not present

## 2015-11-07 DIAGNOSIS — G4733 Obstructive sleep apnea (adult) (pediatric): Secondary | ICD-10-CM | POA: Insufficient documentation

## 2015-11-07 DIAGNOSIS — E1165 Type 2 diabetes mellitus with hyperglycemia: Secondary | ICD-10-CM | POA: Diagnosis not present

## 2015-11-07 DIAGNOSIS — R0600 Dyspnea, unspecified: Secondary | ICD-10-CM | POA: Diagnosis not present

## 2015-11-07 DIAGNOSIS — E1129 Type 2 diabetes mellitus with other diabetic kidney complication: Secondary | ICD-10-CM | POA: Insufficient documentation

## 2015-11-07 DIAGNOSIS — I10 Essential (primary) hypertension: Secondary | ICD-10-CM | POA: Diagnosis not present

## 2015-11-07 MED ORDER — REGADENOSON 0.4 MG/5ML IV SOLN
0.4000 mg | Freq: Once | INTRAVENOUS | Status: AC
  Administered 2015-11-07: 0.4 mg via INTRAVENOUS

## 2015-11-07 MED ORDER — TECHNETIUM TC 99M TETROFOSMIN IV KIT
32.8000 | PACK | Freq: Once | INTRAVENOUS | Status: AC | PRN
  Administered 2015-11-07: 32.8 via INTRAVENOUS
  Filled 2015-11-07: qty 33

## 2015-11-08 ENCOUNTER — Ambulatory Visit (HOSPITAL_COMMUNITY): Payer: BLUE CROSS/BLUE SHIELD | Attending: Cardiology

## 2015-11-08 LAB — MYOCARDIAL PERFUSION IMAGING
LV dias vol: 125 mL (ref 62–150)
LV sys vol: 66 mL
Peak HR: 79 {beats}/min
RATE: 0.29
Rest HR: 71 {beats}/min
SDS: 5
SRS: 3
SSS: 8
TID: 1.02

## 2015-11-08 MED ORDER — TECHNETIUM TC 99M TETROFOSMIN IV KIT
33.0000 | PACK | Freq: Once | INTRAVENOUS | Status: AC | PRN
  Administered 2015-11-08: 33 via INTRAVENOUS
  Filled 2015-11-08: qty 33

## 2015-11-16 ENCOUNTER — Encounter: Payer: Self-pay | Admitting: Internal Medicine

## 2015-11-16 DIAGNOSIS — K921 Melena: Secondary | ICD-10-CM

## 2015-11-18 ENCOUNTER — Encounter: Payer: Self-pay | Admitting: Interventional Cardiology

## 2015-11-21 ENCOUNTER — Encounter: Payer: Self-pay | Admitting: Interventional Cardiology

## 2015-11-30 ENCOUNTER — Other Ambulatory Visit: Payer: Self-pay | Admitting: Internal Medicine

## 2015-12-09 ENCOUNTER — Encounter: Payer: Self-pay | Admitting: Internal Medicine

## 2015-12-09 ENCOUNTER — Encounter: Payer: Self-pay | Admitting: Interventional Cardiology

## 2015-12-13 MED ORDER — ROSUVASTATIN CALCIUM 20 MG PO TABS: 20.0000 mg | ORAL_TABLET | Freq: Every day | ORAL | Status: DC

## 2015-12-13 NOTE — Addendum Note (Signed)
Addended by: Leandrew Koyanagi on: 12/13/2015 02:09 PM   Modules accepted: Orders

## 2015-12-14 ENCOUNTER — Other Ambulatory Visit: Payer: Self-pay

## 2015-12-14 DIAGNOSIS — E785 Hyperlipidemia, unspecified: Secondary | ICD-10-CM

## 2015-12-22 ENCOUNTER — Encounter: Payer: Self-pay | Admitting: Internal Medicine

## 2015-12-27 ENCOUNTER — Other Ambulatory Visit: Payer: Self-pay | Admitting: Internal Medicine

## 2016-01-03 ENCOUNTER — Ambulatory Visit (INDEPENDENT_AMBULATORY_CARE_PROVIDER_SITE_OTHER): Payer: BLUE CROSS/BLUE SHIELD | Admitting: Family Medicine

## 2016-01-03 ENCOUNTER — Ambulatory Visit (INDEPENDENT_AMBULATORY_CARE_PROVIDER_SITE_OTHER): Payer: BLUE CROSS/BLUE SHIELD

## 2016-01-03 VITALS — BP 132/80 | HR 93 | Temp 98.6°F | Resp 17 | Ht 72.0 in | Wt 305.0 lb

## 2016-01-03 DIAGNOSIS — R0789 Other chest pain: Secondary | ICD-10-CM

## 2016-01-03 DIAGNOSIS — L509 Urticaria, unspecified: Secondary | ICD-10-CM | POA: Diagnosis not present

## 2016-01-03 DIAGNOSIS — IMO0002 Reserved for concepts with insufficient information to code with codable children: Secondary | ICD-10-CM

## 2016-01-03 DIAGNOSIS — E1129 Type 2 diabetes mellitus with other diabetic kidney complication: Secondary | ICD-10-CM | POA: Diagnosis not present

## 2016-01-03 DIAGNOSIS — E785 Hyperlipidemia, unspecified: Secondary | ICD-10-CM

## 2016-01-03 DIAGNOSIS — R091 Pleurisy: Secondary | ICD-10-CM

## 2016-01-03 DIAGNOSIS — E1165 Type 2 diabetes mellitus with hyperglycemia: Secondary | ICD-10-CM | POA: Diagnosis not present

## 2016-01-03 DIAGNOSIS — I10 Essential (primary) hypertension: Secondary | ICD-10-CM

## 2016-01-03 LAB — GLUCOSE, POCT (MANUAL RESULT ENTRY): POC Glucose: 242 mg/dl — AB (ref 70–99)

## 2016-01-03 LAB — POCT GLYCOSYLATED HEMOGLOBIN (HGB A1C): Hemoglobin A1C: 7.2

## 2016-01-03 MED ORDER — NIFEDIPINE ER 60 MG PO TB24: 60.0000 mg | ORAL_TABLET | Freq: Every day | ORAL | Status: DC

## 2016-01-03 MED ORDER — METOPROLOL SUCCINATE ER 100 MG PO TB24: 100.0000 mg | ORAL_TABLET | Freq: Every day | ORAL | Status: DC

## 2016-01-03 MED ORDER — INSULIN GLARGINE 100 UNIT/ML SOLOSTAR PEN: 65.0000 [IU] | PEN_INJECTOR | Freq: Every day | SUBCUTANEOUS | Status: DC

## 2016-01-03 MED ORDER — MELOXICAM 15 MG PO TABS: 15.0000 mg | ORAL_TABLET | Freq: Every day | ORAL | Status: DC

## 2016-01-03 MED ORDER — LISINOPRIL-HYDROCHLOROTHIAZIDE 20-12.5 MG PO TABS: 2.0000 | ORAL_TABLET | ORAL | Status: DC

## 2016-01-03 MED ORDER — METFORMIN HCL 500 MG PO TABS: 500.0000 mg | ORAL_TABLET | Freq: Every day | ORAL | Status: DC

## 2016-01-03 NOTE — Patient Instructions (Addendum)
  Coenzyme Q10 daily with the crestor is supposed to decrease joint pains. Try the meloxcam daily along with exercise and using your incentive spirometer to treat the pleurisy  IF you received an x-ray today, you will receive an invoice from [facility]. Please contact [facility] at [phone] with questions or concerns regarding your invoice.   IF you received labwork today, you will receive an invoice from [employer]. Please contact [facility] at [phone] with questions or concerns regarding your invoice.   Our billing staff will not be able to assist you with questions regarding bills from these companies.  You will be contacted with the lab results as soon as they are available. The fastest way to get your results is to activate your My Chart account. Instructions are located on the last page of this paperwork. If you have not heard from [place] regarding the results in 2 weeks, please contact this office.     Pleurisy Pleurisy is an inflammation and swelling of the lining of the lungs (pleura). Because of this inflammation, it hurts to breathe. It can be aggravated by coughing, laughing, or deep breathing. Pleurisy is often caused by an underlying infection or disease.  HOME CARE INSTRUCTIONS  Monitor your pleurisy for any changes. The following actions may help to alleviate any discomfort you are experiencing:  Medicine may help with pain. Only take over-the-counter or prescription medicines for pain, discomfort, or fever as directed by your health care provider.  Only take antibiotic medicine as directed. Make sure to finish it even if you start to feel better. SEEK MEDICAL CARE IF:   Your pain is not controlled with medicine or is increasing.  You have an increase in pus-like (purulent) secretions brought up with coughing. SEEK IMMEDIATE MEDICAL CARE IF:   You have blue or dark lips, fingernails, or toenails.  You are coughing up  blood.  You have increased difficulty breathing.  You have continuing pain unrelieved by medicine or pain lasting more than 1 week.  You have pain that radiates into your neck, arms, or jaw.  You develop increased shortness of breath or wheezing.  You develop a fever, rash, vomiting, fainting, or other serious symptoms. MAKE SURE YOU:  Understand these instructions.   Will watch your condition.   Will get help right away if you are not doing well or get worse.    This information is not intended to replace advice given to you by your health care provider. Make sure you discuss any questions you have with your health care provider.   Document Released: 06/04/2005 Document Revised: 02/04/2013 Document Reviewed: 11/16/2012 Elsevier Interactive Patient Education [employer].

## 2016-01-03 NOTE — Progress Notes (Addendum)
Subjective:  By signing my name below, I, [name], attest that this documentation has been prepared under the direction and in the presence of [name], MD. Electronically Signed: [name], [place]. 01/03/2016 , 3:01 PM .  Patient was seen in Room 8 .   Patient ID: [name], male    DOB: [birth year], 60 y.o.   MRN: [id number] Chief Complaint  Patient presents with  . Allergies    referral request   . patient says he has a sensation on left side chest   HPI [name] is a 60 y.o. male who presents to [facility] complaining of intermittent left chest "poking" discomfort. He feels this ache without any specific activity. He denies feeling any masses or lumps in the area.   He also requested allergist referral because he recently noticed bumps appearing after eating peanut-base foods. He states he was eating Ritz crackers with cheese spread and glass of red wine, and noticed red bumps appearing on his upper arm. He took 2 benadryl for relief.   Patient has brought in data:  Weight is steady at around 296 lbs AM fasting sugar range from 120s to 190s, but largely in the 150~160s BP 120s~140s/70s, with average pulse 80  He recently switched to insulin, stopped at 65 and still takes metformin. He's been taking crestor medication and recently noticed lower extremities swelling. He's switched to taking his crestor once every other morning with some improvement. Prior to crestor, he was taking norvasc but this also caused him to have lower extremity edema.   He sees optometrist, and denies being informed retinopathy.   Has a h/o several cervical DDD for which he was recommended to have surgery which he never did.  He was on oxycodone for a while.  He does have arthritis.  He drinks 1-2 bottles of red wine a day - if I tell him to stop this he wants to restart oxycodone.  He did have a URI about 6-8 wks prior   Past Medical History  Diagnosis Date  . Diabetes mellitus   .  Hypertension   . Rotator cuff tear   . Dyslipidemia   . Morbid obesity ([place])   . Hyperlipidemia   . Sleep apnea   . OSA (obstructive sleep apnea)   . Family history of anesthesia complication     pt states took days for his father to awaken after mask was used   . Seasonal allergies   . Shortness of breath     pt states related to high BP meds with extended walking or climbling stairs  . GERD (gastroesophageal reflux disease)     has been having acid reflux since recent endoscopy   . Arthritis   . Difficult intubation 04/05/2014  . H/O hiatal hernia     repair with lap band, now has ventral hernia midline abd   Prior to Admission medications   Medication Sig Start Date End Date Taking? Authorizing Provider  Ascorbic Acid (VITAMIN C) 100 MG tablet Take 500 mg by mouth once.    Yes Historical Provider, MD  aspirin 81 MG tablet Take 81 mg by mouth every morning.    Yes Historical Provider, MD  B-D UF III MINI PEN NEEDLES 31G X 5 MM MISC USE AS DIRECTED ONCE A DAY 12/01/15  Yes Leandrew Koyanagi, MD  BIOTIN PO Take 1 tablet by mouth daily.    Yes Historical Provider, MD  Cyanocobalamin 2500 MCG CHEW Chew 1 tablet by  mouth daily.   Yes Historical Provider, MD  fluticasone (FLONASE) 50 MCG/ACT nasal spray Place 1 spray into both nostrils daily as needed for allergies or rhinitis.   Yes Historical Provider, MD  glucose blood (TRUETEST TEST) test strip Use as instructed 03/05/15  Yes Leandrew Koyanagi, MD  ibuprofen (ADVIL,MOTRIN) 200 MG tablet Take 400-600 mg by mouth once as needed for headache or mild pain.   Yes Historical Provider, MD  Insulin Glargine (LANTUS) 100 UNIT/ML Solostar Pen Inject 65 Units into the skin at bedtime.   Yes Historical Provider, MD  lisinopril-hydrochlorothiazide (PRINZIDE,ZESTORETIC) 20-12.5 MG tablet Take 2 tablets by mouth every morning. 07/02/15  Yes Leandrew Koyanagi, MD  metFORMIN (GLUCOPHAGE) 500 MG tablet Take 500 mg by mouth daily with breakfast.   Yes  Historical Provider, MD  metoprolol (LOPRESSOR) 100 MG tablet TAKE 1 TABLET (100 MG TOTAL) BY MOUTH EVERY MORNING. 12/29/15  Yes Jaynee Eagles, PA-C  Multiple Vitamin (MULTIVITAMIN) tablet Take 1 tablet by mouth every morning.    Yes Historical Provider, MD  NIFEdipine (PROCARDIA-XL/ADALAT CC) 60 MG 24 hr tablet Take 1 tablet (60 mg total) by mouth daily. 11/04/15  Yes Leandrew Koyanagi, MD  Probiotic Product (PROBIOTIC DAILY PO) Take 1 capsule by mouth daily.    Yes Historical Provider, MD  rosuvastatin (CRESTOR) 20 MG tablet Take 1 tablet (20 mg total) by mouth daily. 12/13/15  Yes Leandrew Koyanagi, MD   Allergies  Allergen Reactions  . Erythromycin Nausea Only  . Shellfish Allergy     Leg swelling     Review of Systems  Constitutional: Negative for fever, chills and fatigue.  Respiratory: Negative for cough and shortness of breath.   Cardiovascular: Positive for chest pain and leg swelling. Negative for palpitations.  Gastrointestinal: Negative for nausea, vomiting and diarrhea.  Skin: Positive for rash. Negative for wound.       Objective:   Physical Exam  Constitutional: He is oriented to person, place, and time. He appears well-developed and well-nourished. No distress.  HENT:  Head: Normocephalic and atraumatic.  Eyes: EOM are normal. Pupils are equal, round, and reactive to light.  Neck: Neck supple.  Cardiovascular: Normal rate.   Pulmonary/Chest: Effort normal. No respiratory distress.  Musculoskeletal: Normal range of motion.  Neurological: He is alert and oriented to person, place, and time.  Skin: Skin is warm and dry.  Psychiatric: He has a normal mood and affect. His behavior is normal.  Nursing note and vitals reviewed.   BP 132/80 mmHg  Pulse 93  Temp(Src) 98.6 F (37 C) (Oral)  Resp 17  Ht 6' (1.829 m)  Wt 305 lb (138.347 kg)  BMI 41.36 kg/m2  SpO2 99%   Results for orders placed or performed in visit on 01/03/16  POCT glucose (manual entry)  Result  Value Ref Range   POC Glucose 242 (A) 70 - 99 mg/dl  POCT glycosylated hemoglobin (Hb A1C)  Result Value Ref Range   Hemoglobin A1C 7.2       Dg Ribs Unilateral W/chest Left  01/03/2016  CLINICAL DATA:  Intermittent LEFT chest poking discomfort. History of hypertension, diabetes. EXAM: LEFT RIBS AND CHEST - 3+ VIEW COMPARISON:  Chest radiograph July 03, 2014 FINDINGS: No fracture or other bone lesions are seen involving the ribs. Persistently elevated LEFT hemidiaphragm with mild strandy densities. There is no evidence of pneumothorax or pleural effusion. Both lungs are clear. Heart size and mediastinal contours are within normal limits. Lapband in LEFT upper  quadrant. IMPRESSION: LEFT lung base atelectasis.  No rib fracture deformity. Electronically Signed   By: Elon Alas M.D.   On: 01/03/2016 15:42    Assessment & Plan:    1. 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 Essential hypertension   3. Hyperlipidemia   4. Other chest pain   5. Hives - relatively recent development to some foods - refer for allergy testing.  6. Pleurisy - try nsaid    Orders Placed This Encounter  Procedures  . DG Ribs Unilateral W/Chest Left    Standing Status: Future     Number of Occurrences: 1     Standing Expiration Date: 01/02/2017    Order Specific Question:  Reason for Exam (SYMPTOM  OR DIAGNOSIS REQUIRED)    Answer:  left lateral anterior chest wall pain underneath    Order Specific Question:  Preferred imaging location?    Answer:  External  . Comprehensive metabolic panel  . Microalbumin/Creatinine Ratio, Urine  . Ambulatory referral to Allergy    Referral Priority:  Routine    Referral Type:  Allergy Testing    Referral Reason:  Specialty Services Required    Requested Specialty:  Allergy    Number of Visits Requested:  1  . POCT glucose (manual entry)  . POCT glycosylated hemoglobin (Hb A1C)    Meds ordered this encounter    Medications  . Insulin Glargine (LANTUS) 100 UNIT/ML Solostar Pen    Sig: Inject 65 Units into the skin at bedtime.    Dispense:  42 pen    Refill:  0  . NIFEdipine (PROCARDIA-XL/ADALAT CC) 60 MG 24 hr tablet    Sig: Take 1 tablet (60 mg total) by mouth daily.    Dispense:  90 tablet    Refill:  1  . metFORMIN (GLUCOPHAGE) 500 MG tablet    Sig: Take 1 tablet (500 mg total) by mouth daily with breakfast.    Dispense:  90 tablet    Refill:  1  . lisinopril-hydrochlorothiazide (PRINZIDE,ZESTORETIC) 20-12.5 MG tablet    Sig: Take 2 tablets by mouth every morning.    Dispense:  360 tablet    Refill:  1  . metoprolol succinate (TOPROL-XL) 100 MG 24 hr tablet    Sig: Take 1 tablet (100 mg total) by mouth daily. Take with or immediately following a meal.    Dispense:  90 tablet    Refill:  1  . meloxicam (MOBIC) 15 MG tablet    Sig: Take 1 tablet (15 mg total) by mouth daily.    Dispense:  30 tablet    Refill:  0   Over 40 min spent in face-to-face evaluation of and consultation with patient and coordination of care.  Over 50% of this time was spent counseling this patient.   I personally performed the services described in this documentation, which was scribed in my presence. The recorded information has been reviewed and considered, and addended by me as needed.   [name], M.D.  [facility] [address] [place], [place] [address] [phone] phone [phone] fax  01/03/2016 4:01 PM

## 2016-01-04 LAB — COMPREHENSIVE METABOLIC PANEL
ALT: 35 U/L (ref 9–46)
AST: 41 U/L — ABNORMAL HIGH (ref 10–35)
Albumin: 4.4 g/dL (ref 3.6–5.1)
Alkaline Phosphatase: 54 U/L (ref 40–115)
BUN: 15 mg/dL (ref 7–25)
CO2: 27 mmol/L (ref 20–31)
Calcium: 9 mg/dL (ref 8.6–10.3)
Chloride: 95 mmol/L — ABNORMAL LOW (ref 98–110)
Creat: 1.33 mg/dL — ABNORMAL HIGH (ref 0.70–1.25)
Glucose, Bld: 236 mg/dL — ABNORMAL HIGH (ref 65–99)
Potassium: 3.4 mmol/L — ABNORMAL LOW (ref 3.5–5.3)
Sodium: 135 mmol/L (ref 135–146)
Total Bilirubin: 0.6 mg/dL (ref 0.2–1.2)
Total Protein: 6.4 g/dL (ref 6.1–8.1)

## 2016-01-04 LAB — MICROALBUMIN / CREATININE URINE RATIO
Creatinine, Urine: 94 mg/dL (ref 20–370)
Microalb Creat Ratio: 13 mcg/mg creat (ref <30)
Microalb, Ur: 1.2 mg/dL

## 2016-01-06 ENCOUNTER — Encounter: Payer: Self-pay | Admitting: Family Medicine

## 2016-01-07 ENCOUNTER — Encounter: Payer: Self-pay | Admitting: Family Medicine

## 2016-01-08 ENCOUNTER — Encounter: Payer: Self-pay | Admitting: Family Medicine

## 2016-01-08 DIAGNOSIS — R0781 Pleurodynia: Secondary | ICD-10-CM

## 2016-01-16 ENCOUNTER — Encounter: Payer: Self-pay | Admitting: Family Medicine

## 2016-01-19 ENCOUNTER — Encounter: Payer: Self-pay | Admitting: Internal Medicine

## 2016-01-20 ENCOUNTER — Ambulatory Visit (INDEPENDENT_AMBULATORY_CARE_PROVIDER_SITE_OTHER): Payer: BLUE CROSS/BLUE SHIELD | Admitting: Internal Medicine

## 2016-01-20 ENCOUNTER — Encounter: Payer: Self-pay | Admitting: Internal Medicine

## 2016-01-20 ENCOUNTER — Other Ambulatory Visit: Payer: BLUE CROSS/BLUE SHIELD

## 2016-01-20 VITALS — BP 132/80 | HR 76 | Ht 72.0 in | Wt 302.0 lb

## 2016-01-20 DIAGNOSIS — S20219A Contusion of unspecified front wall of thorax, initial encounter: Secondary | ICD-10-CM | POA: Insufficient documentation

## 2016-01-20 DIAGNOSIS — S20212A Contusion of left front wall of thorax, initial encounter: Secondary | ICD-10-CM | POA: Diagnosis not present

## 2016-01-20 DIAGNOSIS — R0781 Pleurodynia: Secondary | ICD-10-CM | POA: Diagnosis not present

## 2016-01-20 DIAGNOSIS — J986 Disorders of diaphragm: Secondary | ICD-10-CM | POA: Diagnosis not present

## 2016-01-20 NOTE — Progress Notes (Signed)
Subjective:    Patient ID: [name], male    DOB: 1955-07-11, 60 y.o.   MRN: [id number]  PCP Leandrew Koyanagi, MD > Dr [name]  HPI   IOV 01/20/2016  Chief Complaint  Patient presents with  . Pulmonary Consult    Pt referred by Dr. [name] for pleuritic chest pain - left side. Pt states the pain is better when sleeping on right side. Pt denies significant cough. Pt c/o intermittent DOE.    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 He is accompanied by his wife. He has obesity and metabolic syndrome. He says that starting early June 2017 started noticing a sensation in his left lower chest left upper quadrant anterior axillary area. The pain was constant but occasionally would have spontaneous increase in intensity. The best describes this as a pain but although he says it might not be a pain but moreover sensation. It is definitely relieved when he lies down on his right side and occasionally made worse if he were to take multiple deep breaths but no other clear cut aggravating or relieving factors. Then in mid July 2017 he saw primary care physician in 01/03/2016 had Rib x-ray that was normal. Does report of atelectasis left base and therefore referred. While waiting for this appointment on 01/16/2016 he said he had several more wine drinks than usual and passed out and probably bump the left lower quadrant area of the chest against the desk but he is not sure and then this afternoon and new bruising appeared in the same area as the sensation/pain. Is no change in dyspnea or orthopnea. He does not have. His chronic sleep apnea which uses CPAP at managed by primary care physician. He is worried about pulmonary embolism because his mom died of DVT and pulmonary embolism. There is no hemoptysis  Review of chest x-ray from 2015 and prior chest films Personally visualized shows chronic elevation of the left diaphragm but he is not aware of this  problem    has a past medical history of Arthritis; Diabetes mellitus; Difficult intubation (04/05/2014); Dyslipidemia; Family history of anesthesia complication; GERD (gastroesophageal reflux disease); H/O hiatal hernia; Hyperlipidemia; Hypertension; Morbid obesity ([place]); OSA (obstructive sleep apnea); Rotator cuff tear; Seasonal allergies; Shortness of breath; and Sleep apnea.   reports that he quit smoking about 24 years ago. His smoking use included Cigarettes. He started smoking about 42 years ago. He has a 80.00 pack-year smoking history. He has never used smokeless tobacco.  Past Surgical History:  Procedure Laterality Date  . CARDIAC CATHETERIZATION     approx 3 years ago   . COLONOSCOPY WITH PROPOFOL N/A 03/10/2015   Procedure: COLONOSCOPY WITH PROPOFOL;  Surgeon: Milus Banister, MD;  Location: [facility];  Service: Endoscopy;  Laterality: N/A;  . colonscopy      2012  . ESOPHAGOGASTRODUODENOSCOPY (EGD) WITH PROPOFOL N/A 03/10/2015   Procedure: ESOPHAGOGASTRODUODENOSCOPY (EGD) WITH PROPOFOL;  Surgeon: Milus Banister, MD;  Location: [facility];  Service: Endoscopy;  Laterality: N/A;  . HERNIA REPAIR     with gastric banding  . LAPAROSCOPIC GASTRIC BANDING N/A 04/05/2014   Procedure: LAPAROSCOPIC GASTRIC BANDING;  Surgeon: Pedro Earls, MD;  Location: [facility];  Service: General;  Laterality: N/A;  . MENISCUS REPAIR Left    left knee torn meniscus  . VASECTOMY      Allergies  Allergen Reactions  . Erythromycin Nausea Only  . Peanut-Containing Drug Products  Hives   . Shellfish Allergy     Leg swelling     Immunization History  Administered Date(s) Administered  . Pneumococcal Polysaccharide-23 10/09/2012  . Tdap 02/09/2015    Family History  Problem Relation Age of Onset  . Clotting disorder Mother     lung clot  . Mesothelioma Father   . Hypertension Brother   . Skin cancer Brother   . Colon cancer Neg Hx      Current Outpatient Prescriptions:  .   Ascorbic Acid (VITAMIN C) 100 MG tablet, Take 500 mg by mouth once. , Disp: , Rfl:  .  aspirin 81 MG tablet, Take 81 mg by mouth every morning. , Disp: , Rfl:  .  B-D UF III MINI PEN NEEDLES 31G X 5 MM MISC, USE AS DIRECTED ONCE A DAY, Disp: 100 each, Rfl: 1 .  BIOTIN PO, Take 1 tablet by mouth daily. , Disp: , Rfl:  .  Cyanocobalamin 2500 MCG CHEW, Chew 1 tablet by mouth daily., Disp: , Rfl:  .  fluticasone (FLONASE) 50 MCG/ACT nasal spray, Place 1 spray into both nostrils daily as needed for allergies or rhinitis., Disp: , Rfl:  .  glucose blood (TRUETEST TEST) test strip, Use as instructed, Disp: 100 each, Rfl: 2 .  ibuprofen (ADVIL,MOTRIN) 200 MG tablet, Take 400-600 mg by mouth once as needed for headache or mild pain., Disp: , Rfl:  .  Insulin Glargine (LANTUS) 100 UNIT/ML Solostar Pen, Inject 65 Units into the skin at bedtime. (Patient taking differently: Inject 65 Units into the skin every morning. ), Disp: 42 pen, Rfl: 0 .  lisinopril-hydrochlorothiazide (PRINZIDE,ZESTORETIC) 20-12.5 MG tablet, Take 2 tablets by mouth every morning., Disp: 360 tablet, Rfl: 1 .  metFORMIN (GLUCOPHAGE) 500 MG tablet, Take 1 tablet (500 mg total) by mouth daily with breakfast., Disp: 90 tablet, Rfl: 1 .  metoprolol succinate (TOPROL-XL) 100 MG 24 hr tablet, Take 1 tablet (100 mg total) by mouth daily. Take with or immediately following a meal., Disp: 90 tablet, Rfl: 1 .  Multiple Vitamin (MULTIVITAMIN) tablet, Take 1 tablet by mouth every morning. , Disp: , Rfl:  .  NIFEdipine (PROCARDIA-XL/ADALAT CC) 60 MG 24 hr tablet, Take 1 tablet (60 mg total) by mouth daily., Disp: 90 tablet, Rfl: 1 .  Probiotic Product (PROBIOTIC DAILY PO), Take 1 capsule by mouth daily. , Disp: , Rfl:  .  rosuvastatin (CRESTOR) 20 MG tablet, Take 1 tablet (20 mg total) by mouth daily. (Patient taking differently: Take 20 mg by mouth every other day. ), Disp: 90 tablet, Rfl: 1   Review of Systems  Constitutional: Negative for fever  and unexpected weight change.  HENT: Negative for congestion, dental problem, ear pain, nosebleeds, postnasal drip, rhinorrhea, sinus pressure, sneezing, sore throat and trouble swallowing.   Eyes: Negative for redness and itching.  Respiratory: Positive for shortness of breath. Negative for cough, chest tightness and wheezing.   Cardiovascular: Positive for chest pain and leg swelling. Negative for palpitations.  Gastrointestinal: Negative for nausea and vomiting.  Genitourinary: Negative for dysuria.  Musculoskeletal: Negative for joint swelling.  Skin: Negative for rash.  Neurological: Negative for headaches.  Hematological: Does not bruise/bleed easily.  Psychiatric/Behavioral: Negative for dysphoric mood. The patient is not nervous/anxious.        Objective:   Physical Exam  Constitutional: He is oriented to person, place, and time. He appears well-developed and well-nourished. No distress.  HENT:  Head: Normocephalic and atraumatic.  Right Ear: External ear  normal.  Left Ear: External ear normal.  Mouth/Throat: Oropharynx is clear and moist. No oropharyngeal exudate.  Eyes: Conjunctivae and EOM are normal. Pupils are equal, round, and reactive to light. Right eye exhibits no discharge. Left eye exhibits no discharge. No scleral icterus.  Neck: Normal range of motion. Neck supple. No JVD present. No tracheal deviation present. No thyromegaly present.  Cardiovascular: Normal rate, regular rhythm and intact distal pulses.  Exam reveals no gallop and no friction rub.   No murmur heard. Pulmonary/Chest: Effort normal and breath sounds normal. No respiratory distress. He has no wheezes. He has no rales. He exhibits no tenderness.  Bruise around left anterior axillary and LUQ area - yellowish.Non tender to palpation  Abdominal: Soft. Bowel sounds are normal. He exhibits no distension and no mass. There is no tenderness. There is no rebound and no guarding.  Visceral obesity +    Musculoskeletal: Normal range of motion. He exhibits no edema or tenderness.  Lymphadenopathy:    He has no cervical adenopathy.  Neurological: He is alert and oriented to person, place, and time. He has normal reflexes. No cranial nerve deficit. Coordination normal.  Skin: Skin is warm and dry. No rash noted. He is not diaphoretic. No erythema. No pallor.  Psychiatric: He has a normal mood and affect. His behavior is normal. Judgment and thought content normal.  Nursing note and vitals reviewed.   Vitals:   01/20/16 1644  BP: 132/80  Pulse: 76  SpO2: 96%  Weight: (!) 302 lb (137 kg)  Height: 6' (1.829 m)    Estimated body mass index is 40.96 kg/m as calculated from the following:   Height as of this encounter: 6' (1.829 m).   Weight as of this encounter: 302 lb (137 kg).       Assessment & Plan:     ICD-9-CM ICD-10-CM   1. Chest pain, pleuritic 786.52 R07.81   2. Superficial bruising of chest wall, left, initial encounter 922.1 S20.212A   3. 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 Nevertheless he wants is ruled out. The superficial bruising of the same area.post dates the onset of symptoms by significant period and I think is related to trauma. He also appears to have chronic elevated hemidiaphragm for many years and I'm unsure if this is related to his symptoms are not. At this point in time he wants workup. We'll start with a d-dimer blood test. If this is normal we will do CT chest without contrast. If it is abnormal we will do CT chest with contrast. We'll also get a sniff test looking at his diaphragmatic function   Do d-dimer blood work 01/20/2016 CT chest with or without contrast depending on d-dimer blood test Do sniff test next week  Followup  - depending on test results   He and wife agreement with the plan and verbalized  understanding   Dr. Brand Males, M.D., [facility].C.P Pulmonary and Critical Care Medicine Staff Physician [place] [facility] Pager: [phone], If no answer or between  15:00h - 7:00h: call [phone]  01/20/2016 5:57 PM

## 2016-01-20 NOTE — Patient Instructions (Signed)
ICD-9-CM ICD-10-CM   1. Chest pain, pleuritic 786.52 R07.81   2. Superficial bruising of chest wall, left, initial encounter 922.1 S20.212A   3. 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

## 2016-01-21 ENCOUNTER — Telehealth: Payer: Self-pay | Admitting: Internal Medicine

## 2016-01-21 DIAGNOSIS — R0781 Pleurodynia: Secondary | ICD-10-CM

## 2016-01-21 LAB — D-DIMER, QUANTITATIVE: D-Dimer, Quant: 0.42 mcg/mL FEU (ref <0.50)

## 2016-01-21 NOTE — Telephone Encounter (Signed)
Let [name] know that d-dimer normal. He should go ahead with CT chest wo contrast and sniff test  Dr. Brand Males, M.D., [facility].C.P Pulmonary and Critical Care Medicine Staff Physician [place] [facility] Pager: [phone], If no answer or between  15:00h - 7:00h: call [phone]  01/21/2016 11:39 PM   g

## 2016-01-23 NOTE — Telephone Encounter (Signed)
Patient aware of Dr. Golden Pop recommendations.  Patient states that he is scheduled to have CT done on 8/9, would like to have Sniff test done at same time.  Ordered Sniff test and requested that it be done same time.  Nothing further needed.

## 2016-01-24 ENCOUNTER — Ambulatory Visit (HOSPITAL_COMMUNITY)
Admission: RE | Admit: 2016-01-24 | Discharge: 2016-01-24 | Disposition: A | Discharge status: Home / Self Care | Payer: BLUE CROSS/BLUE SHIELD | Source: Ambulatory Visit | Attending: Internal Medicine | Admitting: Internal Medicine

## 2016-01-24 DIAGNOSIS — R0781 Pleurodynia: Secondary | ICD-10-CM

## 2016-01-24 DIAGNOSIS — K3189 Other diseases of stomach and duodenum: Secondary | ICD-10-CM | POA: Diagnosis not present

## 2016-01-25 ENCOUNTER — Ambulatory Visit
Admission: RE | Admit: 2016-01-25 | Discharge: 2016-01-25 | Disposition: A | Discharge status: Home / Self Care | Payer: BLUE CROSS/BLUE SHIELD | Source: Ambulatory Visit | Attending: Family Medicine | Admitting: Family Medicine

## 2016-01-25 DIAGNOSIS — R0781 Pleurodynia: Secondary | ICD-10-CM

## 2016-01-29 ENCOUNTER — Encounter: Payer: Self-pay | Admitting: Interventional Cardiology

## 2016-01-31 ENCOUNTER — Encounter: Payer: Self-pay | Admitting: Family Medicine

## 2016-02-04 ENCOUNTER — Telehealth: Payer: Self-pay | Admitting: Internal Medicine

## 2016-02-04 NOTE — Telephone Encounter (Addendum)
    Let [name] know that sniff test and CT chest are normal. SO cause for pain not identified  Dr. Brand Males, M.D., [facility].C.P Pulmonary and Critical Care Medicine Staff Physician [place] [facility] Pager: [phone], If no answer or between  15:00h - 7:00h: call [phone]  02/04/2016 12:46 AM

## 2016-02-07 NOTE — Telephone Encounter (Signed)
lmtcb for pt.  

## 2016-02-07 NOTE — Telephone Encounter (Signed)
Pt returning call to elise for results, please advise pt can be reached @ [phone].Hillery Hunter

## 2016-02-07 NOTE — Telephone Encounter (Signed)
I spoke with patient about results and he verbalized understanding and had no questions. He would like to know what Dr. Chase Caller thinks this is coming from? He wants to know if he thinks the lap band could be causing this? Or possible a "gas pocket" pushing his lung around?  Please advise MR thanks

## 2016-02-13 NOTE — Telephone Encounter (Signed)
Called [name] 11:18 AM 02/13/2016 and told him I would try to call him again to answer question

## 2016-03-01 ENCOUNTER — Other Ambulatory Visit: Payer: Self-pay | Admitting: Family Medicine

## 2016-04-05 ENCOUNTER — Ambulatory Visit: Payer: BLUE CROSS/BLUE SHIELD | Admitting: Family Medicine

## 2016-04-30 ENCOUNTER — Other Ambulatory Visit: Payer: Self-pay | Admitting: Family Medicine

## 2016-04-30 NOTE — Telephone Encounter (Signed)
12/2015 last ov 08/2015 last lab

## 2016-06-16 ENCOUNTER — Telehealth: Payer: Self-pay

## 2016-06-16 MED ORDER — INSULIN PEN NEEDLE 31G X 5 MM MISC: 0 refills | Status: DC

## 2016-06-16 NOTE — Telephone Encounter (Signed)
Fax req for BD Ultra-fine pens [facility]  sent

## 2016-06-20 ENCOUNTER — Other Ambulatory Visit: Payer: Self-pay | Admitting: Family Medicine

## 2016-06-26 ENCOUNTER — Other Ambulatory Visit: Payer: Self-pay | Admitting: Family Medicine

## 2016-06-27 ENCOUNTER — Other Ambulatory Visit: Payer: Self-pay | Admitting: Family Medicine

## 2016-06-27 MED ORDER — INSULIN GLARGINE 100 UNIT/ML SOLOSTAR PEN
65.0000 [IU] | PEN_INJECTOR | Freq: Every day | SUBCUTANEOUS | 0 refills | Status: DC
Start: 2016-06-27 — End: 2016-06-30

## 2016-06-27 NOTE — Telephone Encounter (Signed)
Last ov and lab 12/2015 due for ov/scheduled 06/30/16

## 2016-06-28 ENCOUNTER — Encounter (HOSPITAL_COMMUNITY): Payer: Self-pay

## 2016-06-30 ENCOUNTER — Encounter: Payer: Self-pay | Admitting: Family Medicine

## 2016-06-30 ENCOUNTER — Ambulatory Visit (INDEPENDENT_AMBULATORY_CARE_PROVIDER_SITE_OTHER): Payer: BLUE CROSS/BLUE SHIELD

## 2016-06-30 ENCOUNTER — Ambulatory Visit (INDEPENDENT_AMBULATORY_CARE_PROVIDER_SITE_OTHER): Payer: BLUE CROSS/BLUE SHIELD | Admitting: Family Medicine

## 2016-06-30 VITALS — BP 110/80 | HR 75 | Temp 97.6°F | Resp 17 | Ht 72.0 in | Wt 304.0 lb

## 2016-06-30 DIAGNOSIS — R0609 Other forms of dyspnea: Secondary | ICD-10-CM

## 2016-06-30 DIAGNOSIS — R0601 Orthopnea: Secondary | ICD-10-CM | POA: Diagnosis not present

## 2016-06-30 DIAGNOSIS — I10 Essential (primary) hypertension: Secondary | ICD-10-CM | POA: Diagnosis not present

## 2016-06-30 DIAGNOSIS — E1165 Type 2 diabetes mellitus with hyperglycemia: Secondary | ICD-10-CM | POA: Diagnosis not present

## 2016-06-30 DIAGNOSIS — Z6841 Body Mass Index (BMI) 40.0 and over, adult: Secondary | ICD-10-CM | POA: Diagnosis not present

## 2016-06-30 DIAGNOSIS — I7789 Other specified disorders of arteries and arterioles: Secondary | ICD-10-CM

## 2016-06-30 DIAGNOSIS — R600 Localized edema: Secondary | ICD-10-CM

## 2016-06-30 DIAGNOSIS — I77811 Abdominal aortic ectasia: Secondary | ICD-10-CM

## 2016-06-30 DIAGNOSIS — R0600 Dyspnea, unspecified: Secondary | ICD-10-CM

## 2016-06-30 DIAGNOSIS — D72819 Decreased white blood cell count, unspecified: Secondary | ICD-10-CM

## 2016-06-30 DIAGNOSIS — I700 Atherosclerosis of aorta: Secondary | ICD-10-CM

## 2016-06-30 DIAGNOSIS — M545 Low back pain, unspecified: Secondary | ICD-10-CM

## 2016-06-30 DIAGNOSIS — D696 Thrombocytopenia, unspecified: Secondary | ICD-10-CM | POA: Diagnosis not present

## 2016-06-30 DIAGNOSIS — D751 Secondary polycythemia: Secondary | ICD-10-CM | POA: Diagnosis not present

## 2016-06-30 DIAGNOSIS — E785 Hyperlipidemia, unspecified: Secondary | ICD-10-CM

## 2016-06-30 DIAGNOSIS — G8929 Other chronic pain: Secondary | ICD-10-CM

## 2016-06-30 DIAGNOSIS — E1129 Type 2 diabetes mellitus with other diabetic kidney complication: Secondary | ICD-10-CM

## 2016-06-30 DIAGNOSIS — M25511 Pain in right shoulder: Secondary | ICD-10-CM

## 2016-06-30 DIAGNOSIS — Z23 Encounter for immunization: Secondary | ICD-10-CM

## 2016-06-30 DIAGNOSIS — IMO0002 Reserved for concepts with insufficient information to code with codable children: Secondary | ICD-10-CM

## 2016-06-30 LAB — POCT CBC
Granulocyte percent: 60.9 %G (ref 37–80)
HCT, POC: 56.1 % — AB (ref 43.5–53.7)
Hemoglobin: 19.9 g/dL — AB (ref 14.1–18.1)
Lymph, poc: 1 (ref 0.6–3.4)
MCH, POC: 33.7 pg — AB (ref 27–31.2)
MCHC: 35.5 g/dL — AB (ref 31.8–35.4)
MCV: 95.1 fL (ref 80–97)
MID (cbc): 0.1 (ref 0–0.9)
MPV: 6.3 fL (ref 0–99.8)
POC Granulocyte: 1.6 — AB (ref 2–6.9)
POC LYMPH PERCENT: 35.4 %L (ref 10–50)
POC MID %: 3.7 %M (ref 0–12)
Platelet Count, POC: 90 10*3/uL — AB (ref 142–424)
RBC: 5.89 M/uL (ref 4.69–6.13)
RDW, POC: 12.4 %
WBC: 2.7 10*3/uL — AB (ref 4.6–10.2)

## 2016-06-30 LAB — POCT SEDIMENTATION RATE: POCT SED RATE: 22 mm/hr (ref 0–22)

## 2016-06-30 LAB — POCT GLYCOSYLATED HEMOGLOBIN (HGB A1C): Hemoglobin A1C: 8.3

## 2016-06-30 MED ORDER — POTASSIUM CHLORIDE CRYS ER 20 MEQ PO TBCR: 20.0000 meq | EXTENDED_RELEASE_TABLET | Freq: Every day | ORAL | 3 refills | Status: DC

## 2016-06-30 MED ORDER — FUROSEMIDE 40 MG PO TABS: 40.0000 mg | ORAL_TABLET | Freq: Every day | ORAL | 3 refills | Status: DC

## 2016-06-30 MED ORDER — INSULIN GLARGINE 100 UNIT/ML SOLOSTAR PEN: 70.0000 [IU] | PEN_INJECTOR | Freq: Every day | SUBCUTANEOUS | 1 refills | Status: DC

## 2016-06-30 MED ORDER — METOPROLOL SUCCINATE ER 100 MG PO TB24: 100.0000 mg | ORAL_TABLET | Freq: Every day | ORAL | 1 refills | Status: DC

## 2016-06-30 MED ORDER — ROSUVASTATIN CALCIUM 20 MG PO TABS: 20.0000 mg | ORAL_TABLET | ORAL | 1 refills | Status: DC

## 2016-06-30 MED ORDER — METFORMIN HCL 500 MG PO TABS: 1000.0000 mg | ORAL_TABLET | Freq: Two times a day (BID) | ORAL | 1 refills | Status: DC

## 2016-06-30 MED ORDER — LISINOPRIL-HYDROCHLOROTHIAZIDE 20-12.5 MG PO TABS: 2.0000 | ORAL_TABLET | ORAL | 1 refills | Status: DC

## 2016-06-30 NOTE — Progress Notes (Signed)
Subjective:  By signing my name below, I, [name], attest that this documentation has been prepared under the direction and in the presence of [name] Cheadle, MD. Electronically Signed: [name], [place]. 06/30/2016 , 10:29 AM .  Patient was seen in Room 11 .   Patient ID: [name], male    DOB: July 19, 1955, 61 y.o.   MRN: [id number] Chief Complaint  Patient presents with  . Medication Refill    Needs labs (DM) & not sure if he wants the flu shot yet.   HPI [name] is a 61 y.o. male who presents to [facility] for medication refill and requests labs for his diabetes. Patient was last seen 6 months ago. At that time, he reports checking his sugar and BP at home. He is on 65 units of Lantus with metformin 500mg  qam. Thought daily Crestor was causing pedal edema so imprved with every other day dosing with cessation of Norvasc. His last A1C was 7.2. He sees his eye doctor annually with no history of retinopathy. His micro albumin was normal 6 months prior. He is also on lisinopril. His last renal function decreased from baseline creatinine of 1.0, up to 1.3. He was asked to recheck in several weeks but did not return. His GFR was 57, down from 84 a year prior. His last lipid panel 10 months prior showed triglycerides at 540 and total cholesterol of 206. At last visit, he also mentions drinking about 1-2 bottles of red wine a day, and "if he stopped, he'll require restarting oxycodone".   Patient reports still on Lantus 65 units. He checks his sugars in the morning, and it runs from 70 to 200, usually higher than evening readings. He stopped checking evening readings. He denies any symptomatic lows. His highest reading noted is 205, but when eating poorly. He eats dinner around 6:00-7:00PM - last night's dinner was spaghetti. He usually eats chicken, prepared in frying pan or stir fry. He usually sleeps about 10:00PM-12:00AM. He reports his biggest meal is usually lunch. He also reports  occasional pressuring pain in his feet when he's laying down. He's changed different shoes with relief.   He also noticed daily Crestor caused his leg swelling with cramping pain, so he switched to taking it every other day. For the past 6 months, his skin over lower extremities would sometimes feel uncomfortable when he rubs it. He has occasional dizziness when changing position from sitting to standing. He would drink 3-4 glasses of red wine a night for the past 20 years. He also mentions having occasional shortness of breath when taking out the trash. He denies shortness of breath when laying down supine. He is still using cpap at night, and rare nocturia. He had normal stress test with cardiologist in May 2017, showed EF 47% mildly decreased but no risk of ischemia.   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 He also had torn rotator cuff, now into more discomfort in his right bicep. He has pain over the central lower back. He denies shooting pain down into his legs. He had MRI in 2011 that showed L3-S1 was spurred or narrowed.   Regarding his red wine consumption, he has 0 urges throughout the day, but when 4:00PM-8:00PM, he can sleep easier if he drinks 5-6 glasses of red wine. He would rather continue this than going back to oxycodone.   He reports going from no cough to coughing up drainage when he lays  down. He denies taking Claritin daily. He hasn't received his flu shot this season. He takes occasional benadryl at night because he feels general itchiness and it also helps him to rest.   Past Medical History:  Diagnosis Date  . Arthritis   . Diabetes mellitus   . Difficult intubation 04/05/2014  . Dyslipidemia   . Family history of anesthesia complication    pt states took days for his father to awaken after mask was used   . GERD (gastroesophageal reflux disease)    has been having acid reflux since recent endoscopy   . H/O hiatal hernia    repair with  lap band, now has ventral hernia midline abd  . Hyperlipidemia   . Hypertension   . Morbid obesity ([place])   . OSA (obstructive sleep apnea)   . Rotator cuff tear   . Seasonal allergies   . Shortness of breath    pt states related to high BP meds with extended walking or climbling stairs  . Sleep apnea    Prior to Admission medications   Medication Sig Start Date End Date Taking? Authorizing Provider  Ascorbic Acid (VITAMIN C) 100 MG tablet Take 500 mg by mouth once.    Yes Historical Provider, MD  aspirin 81 MG tablet Take 81 mg by mouth every morning.    Yes Historical Provider, MD  BIOTIN PO Take 1 tablet by mouth daily.    Yes Historical Provider, MD  Cyanocobalamin 2500 MCG CHEW Chew 1 tablet by mouth daily.   Yes Historical Provider, MD  fluticasone (FLONASE) 50 MCG/ACT nasal spray Place 1 spray into both nostrils daily as needed for allergies or rhinitis.   Yes Historical Provider, MD  glucose [name] (TRUETEST TEST) test strip Use as instructed 03/05/15  Yes [name] Koyanagi, MD  ibuprofen (ADVIL,MOTRIN) 200 MG tablet Take 400-600 mg by mouth once as needed for headache or mild pain.   Yes Historical Provider, MD  Insulin Glargine (LANTUS SOLOSTAR) 100 UNIT/ML Solostar Pen Inject 65 Units into the skin daily at 10 pm. Patient taking differently: Inject 65 Units into the skin every morning.  06/27/16  Yes [name] Knapp, MD  Insulin Pen Needle (B-D UF III MINI PEN NEEDLES) 31G X 5 MM MISC USE AS DIRECTED ONCE A DAY 06/16/16  Yes [name] Knapp, MD  lisinopril-hydrochlorothiazide (PRINZIDE,ZESTORETIC) 20-12.5 MG tablet Take 2 tablets by mouth every morning. 01/03/16  Yes [name] Knapp, MD  metFORMIN (GLUCOPHAGE) 500 MG tablet Take 1 tablet (500 mg total) by mouth daily with breakfast. 01/03/16  Yes [name] Knapp, MD  metoprolol succinate (TOPROL-XL) 100 MG 24 hr tablet Take 1 tablet (100 mg total) by mouth daily. Take with or immediately following a meal. 01/03/16  Yes [name] Knapp, MD  Multiple Vitamin  (MULTIVITAMIN) tablet Take 1 tablet by mouth every morning.    Yes Historical Provider, MD  NIFEdipine (PROCARDIA-XL/ADALAT CC) 60 MG 24 hr tablet Take 1 tablet (60 mg total) by mouth daily. 01/03/16  Yes [name] Knapp, MD  Probiotic Product (PROBIOTIC DAILY PO) Take 1 capsule by mouth daily.    Yes Historical Provider, MD  rosuvastatin (CRESTOR) 20 MG tablet Take 1 tablet (20 mg total) by mouth daily. Patient taking differently: Take 20 mg by mouth every other day.  12/13/15  Yes [name] Koyanagi, MD   Allergies  Allergen Reactions  . Erythromycin Nausea Only  . Peanut-Containing Drug Products     Hives   . Shellfish  Allergy     Leg swelling    Review of Systems  Constitutional: Negative for fatigue and unexpected weight change.  HENT: Positive for congestion.   Eyes: Negative for visual disturbance.  Respiratory: Positive for cough and shortness of breath. Negative for chest tightness.   Cardiovascular: Positive for leg swelling. Negative for chest pain and palpitations.  Gastrointestinal: Negative for abdominal pain and [name] in stool.  Musculoskeletal: Positive for arthralgias and back pain.  Skin: Negative for rash.  Neurological: Negative for dizziness, weakness, light-headedness, numbness and headaches.       Objective:   Physical Exam  Constitutional: He is oriented to person, place, and time. He appears well-developed and well-nourished. No distress.  HENT:  Head: Normocephalic and atraumatic.  Eyes: EOM are normal. Pupils are equal, round, and reactive to light.  Neck: Neck supple.  Cardiovascular: Normal rate.   Pulmonary/Chest: Effort normal. No respiratory distress. He has rales in the right lower field and the left lower field.  Musculoskeletal: Normal range of motion.       Right lower leg: He exhibits edema (2+).       Left lower leg: He exhibits edema (2+).  Neurological: He is alert and oriented to person, place, and time.  Skin: Skin is warm and dry.    Psychiatric: He has a normal mood and affect. His behavior is normal.  Nursing note and vitals reviewed.  Diabetic Foot Exam - Simple   Simple Foot Form Diabetic Foot exam was performed with the following findings:  Yes 06/30/2016 10:29 AM  Visual Inspection See comments:  Yes Sensation Testing Intact to touch and monofilament testing bilaterally:  Yes Pulse Check Posterior Tibialis and Dorsalis pulse intact bilaterally:  Yes Comments Decreased cap refill to up to 4-5 sec, purple, cold, 2 eschars on dorsum of left proximal foot 1 cm and 1/2 cm - non tender, no drainage, no fluctuation, no induration, no warmth, no color change      BP 110/80   Pulse 75   Temp 97.6 F (36.4 C) (Oral)   Resp 17   Ht 6' (1.829 m)   Wt (!) 304 lb (137.9 kg)   SpO2 96%   BMI 41.23 kg/m    Results for orders placed or performed in visit on 06/30/16  POCT glycosylated hemoglobin (Hb A1C)  Result Value Ref Range   Hemoglobin A1C 8.3   POCT CBC  Result Value Ref Range   WBC 2.7 (A) 4.6 - 10.2 K/uL   Lymph, poc 1.0 0.6 - 3.4   POC LYMPH PERCENT 35.4 10 - 50 %L   MID (cbc) 0.1 0 - 0.9   POC MID % 3.7 0 - 12 %M   POC Granulocyte 1.6 (A) 2 - 6.9   Granulocyte percent 60.9 37 - 80 %G   RBC 5.89 4.69 - 6.13 M/uL   Hemoglobin 19.9 (A) 14.1 - 18.1 g/dL   HCT, POC 56.1 (A) 43.5 - 53.7 %   MCV 95.1 80 - 97 fL   MCH, POC 33.7 (A) 27 - 31.2 pg   MCHC 35.5 (A) 31.8 - 35.4 g/dL   RDW, POC 12.4 %   Platelet Count, POC 90 (A) 142 - 424 K/uL   MPV 6.3 0 - 99.8 fL   Dg Chest 2 View  Result Date: 06/30/2016 CLINICAL DATA:  Shortness of breath. EXAM: CHEST  2 VIEW COMPARISON:  CT of the chest 01/25/2016 FINDINGS: Heart size is normal. Lung bases are clear. Degenerative changes are  present in the thoracic spine. The visualized soft tissues and bony thorax are unremarkable. IMPRESSION: No active cardiopulmonary disease. Electronically Signed   By: San Morelle M.D.   On: 06/30/2016 11:01   Dg Lumbar  Spine 2-3 Views  Result Date: 06/30/2016 CLINICAL DATA:  Low back pain known arthritis  No known injury EXAM: LUMBAR SPINE - 2-3 VIEW COMPARISON:  MR 03/01/2010 FINDINGS: There is no evidence of lumbar spine fracture. Mild narrowing of the L3-4 interspace. Endplate spurring at all lumbar levels. Facet DJD L4-S1. Components of gastric band apparatus partially visualized. Atheromatous calcifications in the abdominal aorta suggesting 4 cm aneurysm. IMPRESSION: 1. Negative for fracture or other acute bone abnormality. 2. Multilevel degenerative changes as above. 3. Suspect 4 cm abdominal aortic aneurysm. Consider ultrasound aorta for further evaluation. Electronically Signed   By: Lucrezia Europe M.D.   On: 06/30/2016 11:12   Dg Shoulder Right  Result Date: 06/30/2016 CLINICAL DATA:  Arthritis, pain without trauma EXAM: RIGHT SHOULDER - 2+ VIEW COMPARISON:  None. FINDINGS: There is no evidence of fracture or dislocation. There is no evidence of arthropathy or other focal bone abnormality. Soft tissues are unremarkable. IMPRESSION: Negative. Electronically Signed   By: Lucrezia Europe M.D.   On: 06/30/2016 11:13       Assessment & Plan:   1. 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 Could increase lantus but would need to split dose into 2 injections - pt opts to work on tlc/diet first.     2. Essential hypertension - well controlled, checks at home  3. Hyperlipidemia, unspecified hyperlipidemia type   4. BMI 40.0-44.9, adult ([place])   5. Pedal edema - stop nifedipine 60 as likely exacerbating and start lasix 40 with K 20  6. Pain in joint of right shoulder - xray today, rec pt RTC for a visit with sports med Dr. Carlota Raspberry for further eval  7. Chronic bilateral low back pain without  sciatica - arthritis and DDD from weight  8. Need for prophylactic vaccination and inoculation against influenza   9. Dyspnea on exertion  - bibasilar rales on exam, CXR nml so suspect atelectasis from sedentary lifestyle  10. Orthopnea   11. Leukopenia, unspecified type - new, all prior cbcs normal, but very concerning that all 3 cell lines are abnormal - recheck with send out CBC for ANC and peripheral smear. Check inflammatory factors. If send out cbc is abnml, will refer to hematology for further eval  12. Thrombocytopenia ([place])   13. Polycythemia, secondary   14. Enlargement of abdominal aorta ([place]) - [place] 2015 noted nml abd aorta but 4 cm on lumbar films today, recheck [facility]  15. Calcification of abdominal aorta ([place])     Orders Placed This Encounter  Procedures  . DG Lumbar Spine 2-3 Views    Standing Status:   Future    Number of Occurrences:   1    Standing Expiration Date:   06/30/2017    Order Specific Question:   Reason for Exam (SYMPTOM  OR DIAGNOSIS REQUIRED)    Answer:   low back pain worsening, no radiculopathy, new right shoulder pain and weakness for sev mos.    Order Specific Question:   Preferred imaging location?    Answer:  External  . DG Shoulder Right    Standing Status:   Future    Number of Occurrences:   1    Standing Expiration Date:   06/30/2017    Order Specific Question:   Reason for Exam (SYMPTOM  OR DIAGNOSIS REQUIRED)    Answer:   low back pain worsening, no radiculopathy, new right shoulder pain and weakness for sev mos.    Order Specific Question:   Preferred imaging location?    Answer:   External  . DG Chest 2 View    Standing Status:   Future    Number of Occurrences:   1    Standing Expiration Date:   06/30/2017    Order Specific Question:   Reason for Exam (SYMPTOM  OR DIAGNOSIS REQUIRED)    Answer:   bibasilar rales, increased prod cough and orthopnea    Order Specific Question:   Preferred imaging location?    Answer:   External  . Flu Vaccine  QUAD 36+ mos IM  . Comprehensive metabolic panel    Order Specific Question:   Has the patient fasted?    Answer:   Yes  . Lipid panel    Order Specific Question:   Has the patient fasted?    Answer:   Yes  . Brain natriuretic peptide  . CBC with Differential/Platelet  . Pathologist smear review  . C-reactive protein  . Protime-INR  . APTT  . Care order/instruction:    AVS and GO after assistant does foot exam please    Scheduling Instructions:     AVS and GO after assistant does foot exam please  . POCT glycosylated hemoglobin (Hb A1C)  . POCT CBC  . POCT SEDIMENTATION RATE  . HM DIABETES FOOT EXAM    Meds ordered this encounter  Medications  . rosuvastatin (CRESTOR) 20 MG tablet    Sig: Take 1 tablet (20 mg total) by mouth every other day.    Dispense:  90 tablet    Refill:  1  . metoprolol succinate (TOPROL-XL) 100 MG 24 hr tablet    Sig: Take 1 tablet (100 mg total) by mouth daily. Take with or immediately following a meal.    Dispense:  90 tablet    Refill:  1  . lisinopril-hydrochlorothiazide (PRINZIDE,ZESTORETIC) 20-12.5 MG tablet    Sig: Take 2 tablets by mouth every morning.    Dispense:  180 tablet    Refill:  1  . furosemide (LASIX) 40 MG tablet    Sig: Take 1 tablet (40 mg total) by mouth daily.    Dispense:  30 tablet    Refill:  3  . potassium chloride SA (K-DUR,KLOR-CON) 20 MEQ tablet    Sig: Take 1 tablet (20 mEq total) by mouth daily.    Dispense:  30 tablet    Refill:  3   Over 40 min spent in face-to-face evaluation of and consultation with patient and coordination of care.  Over 50% of this time was spent counseling this patient.  I personally performed the services described in this documentation, which was scribed in my presence. The recorded information has been reviewed and considered, and addended by me as needed.   [name], M.D.  [facility] [address] [place], [place] [address] [phone]  phone [phone] fax  06/30/16 9:27 PM

## 2016-06-30 NOTE — Patient Instructions (Addendum)
Lets do a trial off of the procardia for a month.  If you don't notice any change in your swelling, then we can restart it.  Start the lasix and potassium every morning to help decrease the swelling. Wear knee high compression socks throughout the day.  Drink plenty of water and get additional potassium in your diet. If your blood pressure is elevated even with the lasix, we could do a repeat trial of procardia while keeping the lasix on board or call with numbers and I can send some hydralazine in to your pharmacy.    Diabetes Mellitus and Standards of Medical Care Managing diabetes (diabetes mellitus) can be complicated. Your diabetes treatment may be managed by a team of health care providers, including:  A diet and nutrition specialist (registered dietitian).  A nurse.  A certified diabetes educator (CDE).  A diabetes specialist (endocrinologist).  An eye doctor.  A primary care provider.  A dentist. Your health care providers follow a schedule in order to help you get the best quality of care. The following schedule is a general guideline for your diabetes management plan. Your health care providers may also give you more specific instructions. HbA1c ( hemoglobin A1c) test This test provides information about blood sugar (glucose) control over the previous 2-3 months. It is used to check whether your diabetes management plan needs to be adjusted.  If you are meeting your treatment goals, this test is done at least 2 times a year.  If you are not meeting treatment goals or if your treatment goals have changed, this test is done 4 times a year. Blood pressure test  This test is done at every routine medical visit. For most people, the goal is less than 140/90. In some cases, your goal blood pressure may be 130/80 or less. Ask your health care provider what your goal blood pressure should be. Dental and eye exams  Visit your dentist two times a year.  If you have type 1  diabetes, get an eye exam 3-5 years after you are diagnosed, and then once a year after your first exam.  If you were diagnosed with type 1 diabetes as a child, get an eye exam when you are age 42 or older and have had diabetes for 3-5 years. After the first exam, you should get an eye exam once a year.  If you have type 2 diabetes, have an eye exam as soon as you are diagnosed, and then once a year after your first exam. Foot care exam  Visual foot exams are done at every routine medical visit. The exams check for cuts, bruises, redness, blisters, sores, or other problems with the feet.  A complete foot exam is done by your health care provider once a year. This exam includes an inspection of the structure and skin of your feet, and a check of the pulses and sensation in your feet.  Type 1 diabetes: Get your first exam 3-5 years after diagnosis.  Type 2 diabetes: Get your first exam as soon as you are diagnosed.  Check your feet every day for cuts, bruises, redness, blisters, or sores. If you have any of these or other problems that are not healing, contact your health care provider. Kidney function test ( urine microalbumin)  This test is done once a year.  Type 1 diabetes: Get your first test 5 years after diagnosis.  Type 2 diabetes: Get your first test as soon as you are diagnosed.  If you have  chronic kidney disease (CKD), get a serum creatinine and estimated glomerular filtration rate (eGFR) test once a year. Lipid profile (cholesterol, HDL, LDL, triglycerides)  This test should be done when you are diagnosed with diabetes, and every 5 years after the first test. If you are on medicines to lower your cholesterol, you may need to get this test done every year.  The goal for LDL is less than 100 mg/dL (5.5 mmol/L). If you are at high risk, the goal is less than 70 mg/dL (3.9 mmol/L).  The goal for HDL is 40 mg/dL (2.2 mmol/L) for men and 50 mg/dL(2.8 mmol/L) for women. An HDL  cholesterol of 60 mg/dL (3.3 mmol/L) or higher gives some protection against heart disease.  The goal for triglycerides is less than 150 mg/dL (8.3 mmol/L). Immunizations  The yearly flu (influenza) vaccine is recommended for everyone 6 months or older who has diabetes.  The pneumonia (pneumococcal) vaccine is recommended for everyone 2 years or older who has diabetes. If you are 51 or older, you may get the pneumonia vaccine as a series of two separate shots.  The hepatitis B vaccine is recommended for adults shortly after they have been diagnosed with diabetes.  The Tdap (tetanus, diphtheria, and pertussis) vaccine should be given:  According to normal childhood vaccination schedules, for children.  Every 10 years, for adults who have diabetes.  The shingles vaccine is recommended for people who have had chicken pox and are 50 years or older. Mental and emotional health  Screening for symptoms of eating disorders, anxiety, and depression is recommended at the time of diagnosis and afterward as needed. If your screening shows that you have symptoms (you have a positive screening result), you may need further evaluation and be referred to a mental health care provider. Diabetes self-management education  Education about how to manage your diabetes is recommended at diagnosis and ongoing as needed. Treatment plan  Your treatment plan will be reviewed at every medical visit. Summary  Managing diabetes (diabetes mellitus) can be complicated. Your diabetes treatment may be managed by a team of health care providers.  Your health care providers follow a schedule in order to help you get the best quality of care.  Standards of care including having regular physical exams, blood tests, blood pressure monitoring, immunizations, screening tests, and education about how to manage your diabetes.  Your health care providers may also give you more specific instructions based on your individual  health. This information is not intended to replace advice given to you by your health care provider. Make sure you discuss any questions you have with your health care provider. Document Released: 04/01/2009 Document Revised: 03/02/2016 Document Reviewed: 03/02/2016 Elsevier Interactive Patient Education  2017 [employer].

## 2016-06-30 NOTE — Progress Notes (Signed)
     IF you received an x-ray today, you will receive an invoice from [facility]. Please contact [facility] at [phone] with questions or concerns regarding your invoice.   IF you received labwork today, you will receive an invoice from [facility]. Please contact [facility] at [phone] with questions or concerns regarding your invoice.   Our billing staff will not be able to assist you with questions regarding bills from these companies.  You will be contacted with the lab results as soon as they are available. The fastest way to get your results is to activate your My Chart account. Instructions are located on the last page of this paperwork. If you have not heard from us regarding the results in 2 weeks, please contact this office.     

## 2016-07-01 ENCOUNTER — Encounter: Payer: Self-pay | Admitting: Family Medicine

## 2016-07-01 LAB — COMPREHENSIVE METABOLIC PANEL
ALT: 38 IU/L (ref 0–44)
AST: 42 IU/L — ABNORMAL HIGH (ref 0–40)
Albumin/Globulin Ratio: 2.1 (ref 1.2–2.2)
Albumin: 4.2 g/dL (ref 3.6–4.8)
Alkaline Phosphatase: 54 IU/L (ref 39–117)
BUN/Creatinine Ratio: 11 (ref 10–24)
BUN: 14 mg/dL (ref 8–27)
Bilirubin Total: 0.5 mg/dL (ref 0.0–1.2)
CO2: 29 mmol/L (ref 18–29)
Calcium: 8.9 mg/dL (ref 8.6–10.2)
Chloride: 95 mmol/L — ABNORMAL LOW (ref 96–106)
Creatinine, Ser: 1.22 mg/dL (ref 0.76–1.27)
GFR calc Af Amer: 74 mL/min/{1.73_m2} (ref >59)
GFR calc non Af Amer: 64 mL/min/{1.73_m2} (ref >59)
Globulin, Total: 2 g/dL (ref 1.5–4.5)
Glucose: 201 mg/dL — ABNORMAL HIGH (ref 65–99)
Potassium: 3.5 mmol/L (ref 3.5–5.2)
Sodium: 140 mmol/L (ref 134–144)
Total Protein: 6.2 g/dL (ref 6.0–8.5)

## 2016-07-01 LAB — CBC WITH DIFFERENTIAL/PLATELET
Basophils Absolute: 0 10*3/uL (ref 0.0–0.2)
Basos: 0 %
EOS (ABSOLUTE): 0.1 10*3/uL (ref 0.0–0.4)
Eos: 2 %
Hematocrit: 42.9 % (ref 37.5–51.0)
Hemoglobin: 15.1 g/dL (ref 13.0–17.7)
Immature Grans (Abs): 0 10*3/uL (ref 0.0–0.1)
Immature Granulocytes: 0 %
Lymphocytes Absolute: 1.9 10*3/uL (ref 0.7–3.1)
Lymphs: 34 %
MCH: 33.9 pg — ABNORMAL HIGH (ref 26.6–33.0)
MCHC: 35.2 g/dL (ref 31.5–35.7)
MCV: 96 fL (ref 79–97)
Monocytes Absolute: 0.5 10*3/uL (ref 0.1–0.9)
Monocytes: 9 %
Neutrophils Absolute: 3 10*3/uL (ref 1.4–7.0)
Neutrophils: 55 %
Platelets: 205 10*3/uL (ref 150–379)
RBC: 4.46 x10E6/uL (ref 4.14–5.80)
RDW: 13.1 % (ref 12.3–15.4)
WBC: 5.5 10*3/uL (ref 3.4–10.8)

## 2016-07-01 LAB — LIPID PANEL
Chol/HDL Ratio: 4.4 ratio units (ref 0.0–5.0)
Cholesterol, Total: 176 mg/dL (ref 100–199)
HDL: 40 mg/dL (ref >39)
LDL Calculated: 74 mg/dL (ref 0–99)
Triglycerides: 308 mg/dL — ABNORMAL HIGH (ref 0–149)
VLDL Cholesterol Cal: 62 mg/dL — ABNORMAL HIGH (ref 5–40)

## 2016-07-02 LAB — PROTIME-INR
INR: 1 (ref 0.8–1.2)
Prothrombin Time: 10.6 s (ref 9.1–12.0)

## 2016-07-02 LAB — C-REACTIVE PROTEIN: CRP: 2.5 mg/L (ref 0.0–4.9)

## 2016-07-02 LAB — APTT: aPTT: 25 s (ref 24–33)

## 2016-07-02 LAB — BRAIN NATRIURETIC PEPTIDE: BNP: 3.6 pg/mL (ref 0.0–100.0)

## 2016-07-03 ENCOUNTER — Telehealth: Payer: Self-pay

## 2016-07-03 DIAGNOSIS — I700 Atherosclerosis of aorta: Secondary | ICD-10-CM

## 2016-07-03 NOTE — Telephone Encounter (Signed)
Hospital scheduling not available . GI will see pt. Needs aorta US order not duplex aaa. Changed .

## 2016-07-03 NOTE — Telephone Encounter (Signed)
You are so wonderfully helpful to myself and our patients. Thank you

## 2016-07-04 LAB — PATHOLOGIST SMEAR REVIEW
Basophils Absolute: 0 10*3/uL (ref 0.0–0.2)
Basos: 0 %
EOS (ABSOLUTE): 0.1 10*3/uL (ref 0.0–0.4)
Eos: 2 %
Hematocrit: 43.5 % (ref 37.5–51.0)
Hemoglobin: 15.4 g/dL (ref 13.0–17.7)
Immature Grans (Abs): 0 10*3/uL (ref 0.0–0.1)
Immature Granulocytes: 0 %
Lymphocytes Absolute: 1.8 10*3/uL (ref 0.7–3.1)
Lymphs: 33 %
MCH: 34.1 pg — ABNORMAL HIGH (ref 26.6–33.0)
MCHC: 35.4 g/dL (ref 31.5–35.7)
MCV: 97 fL (ref 79–97)
Monocytes Absolute: 0.5 10*3/uL (ref 0.1–0.9)
Monocytes: 9 %
Neutrophils Absolute: 3 10*3/uL (ref 1.4–7.0)
Neutrophils: 56 %
Path Rev PLTs: NORMAL
Path Rev RBC: NORMAL
Path Rev WBC: NORMAL
Platelets: 216 10*3/uL (ref 150–379)
RBC: 4.51 x10E6/uL (ref 4.14–5.80)
RDW: 12.8 % (ref 12.3–15.4)
WBC: 5.4 10*3/uL (ref 3.4–10.8)

## 2016-07-06 ENCOUNTER — Ambulatory Visit
Admission: RE | Admit: 2016-07-06 | Discharge: 2016-07-06 | Disposition: A | Discharge status: Home / Self Care | Payer: BLUE CROSS/BLUE SHIELD | Source: Ambulatory Visit | Attending: Family Medicine | Admitting: Family Medicine

## 2016-07-06 ENCOUNTER — Encounter: Payer: Self-pay | Admitting: Family Medicine

## 2016-07-06 DIAGNOSIS — I700 Atherosclerosis of aorta: Secondary | ICD-10-CM

## 2016-07-12 ENCOUNTER — Encounter: Payer: Self-pay | Admitting: Family Medicine

## 2016-07-13 ENCOUNTER — Encounter: Payer: Self-pay | Admitting: Family Medicine

## 2016-07-20 ENCOUNTER — Telehealth: Payer: Self-pay | Admitting: *Deleted

## 2016-07-20 MED ORDER — HYDRALAZINE HCL 25 MG PO TABS: 25.0000 mg | ORAL_TABLET | Freq: Three times a day (TID) | ORAL | 2 refills | Status: DC

## 2016-07-20 NOTE — Telephone Encounter (Signed)
Sent mychart message response to pt.

## 2016-07-20 NOTE — Telephone Encounter (Signed)
Refer to your my chart messages and readings, pt would like hydralazine called in or advise of plan

## 2016-07-20 NOTE — Telephone Encounter (Signed)
Patient called stating he has sent 3 or 4 messages through the protal to Dr Brigitte Pulse about his blood pressure being high and has not received a Response back. Patient is requesting to talk with Dr Brigitte Pulse or the nurse. Please advise [phone]

## 2016-08-07 ENCOUNTER — Encounter: Payer: Self-pay | Admitting: Family Medicine

## 2016-08-17 ENCOUNTER — Telehealth: Payer: Self-pay | Admitting: Family Medicine

## 2016-08-17 NOTE — Telephone Encounter (Signed)
See message from Butch Penny.

## 2016-08-17 NOTE — Telephone Encounter (Signed)
Pt has questions about his blood pressure medication   Best number is [phone]

## 2016-08-17 NOTE — Telephone Encounter (Signed)
What is the question? 

## 2016-08-18 MED ORDER — DILTIAZEM HCL ER COATED BEADS 240 MG PO CP24: 240.0000 mg | ORAL_CAPSULE | Freq: Every day | ORAL | 1 refills | Status: DC

## 2016-08-18 NOTE — Telephone Encounter (Signed)
Wonders what new plan should be regarding meds. (see notes)

## 2016-08-18 NOTE — Telephone Encounter (Signed)
He stopped the hydralazine and had restarted taking procardia but only bid though his lower ext edema did improve off of the procardia and has seemingly not recurred on bid dosing. Advised stopping procardia and trying diltiazem instead - sent 240 to his pharmacy.  Recheck in OV in 6 wks.

## 2016-08-18 NOTE — Telephone Encounter (Signed)
Pt states he has sent multi emails with b/p  readings

## 2016-08-27 ENCOUNTER — Ambulatory Visit (INDEPENDENT_AMBULATORY_CARE_PROVIDER_SITE_OTHER): Payer: BLUE CROSS/BLUE SHIELD | Admitting: Family Medicine

## 2016-08-27 ENCOUNTER — Encounter: Payer: Self-pay | Admitting: Family Medicine

## 2016-08-27 VITALS — BP 130/81 | HR 72 | Temp 98.2°F | Resp 18 | Ht 72.0 in | Wt 298.8 lb

## 2016-08-27 DIAGNOSIS — E785 Hyperlipidemia, unspecified: Secondary | ICD-10-CM

## 2016-08-27 DIAGNOSIS — E1129 Type 2 diabetes mellitus with other diabetic kidney complication: Secondary | ICD-10-CM | POA: Diagnosis not present

## 2016-08-27 DIAGNOSIS — N401 Enlarged prostate with lower urinary tract symptoms: Secondary | ICD-10-CM | POA: Diagnosis not present

## 2016-08-27 DIAGNOSIS — I10 Essential (primary) hypertension: Secondary | ICD-10-CM | POA: Diagnosis not present

## 2016-08-27 DIAGNOSIS — E1165 Type 2 diabetes mellitus with hyperglycemia: Secondary | ICD-10-CM

## 2016-08-27 DIAGNOSIS — IMO0002 Reserved for concepts with insufficient information to code with codable children: Secondary | ICD-10-CM

## 2016-08-27 DIAGNOSIS — R3916 Straining to void: Secondary | ICD-10-CM

## 2016-08-27 MED ORDER — INSULIN GLARGINE-LIXISENATIDE 100-33 UNT-MCG/ML ~~LOC~~ SOPN: 0.6000 mL | PEN_INJECTOR | Freq: Every day | SUBCUTANEOUS | 3 refills | Status: DC

## 2016-08-27 MED ORDER — DOXAZOSIN MESYLATE 2 MG PO TABS: 2.0000 mg | ORAL_TABLET | Freq: Every day | ORAL | 3 refills | Status: DC

## 2016-08-27 MED ORDER — METFORMIN HCL 500 MG PO TABS: 500.0000 mg | ORAL_TABLET | Freq: Every day | ORAL | 1 refills | Status: DC

## 2016-08-27 NOTE — Patient Instructions (Signed)
     IF you received an x-ray today, you will receive an invoice from [facility]. Please contact [facility] at [phone] with questions or concerns regarding your invoice.   IF you received labwork today, you will receive an invoice from [facility]. Please contact [facility] at [phone] with questions or concerns regarding your invoice.   Our billing staff will not be able to assist you with questions regarding bills from these companies.  You will be contacted with the lab results as soon as they are available. The fastest way to get your results is to activate your My Chart account. Instructions are located on the last page of this paperwork. If you have not heard from us regarding the results in 2 weeks, please contact this office.     

## 2016-08-27 NOTE — Progress Notes (Signed)
Subjective:    Patient ID: [name], male    DOB: 1955-11-22, 61 y.o.   MRN: [id number]  Cc: DM f/u   HPI   [name] is a 61 y.o. male who presents to [facility] for medication refill and requests labs for his diabetes. He has been under poor control for most of his chronic medical problems largely due to stressful lifestyle and poor diet/exercise patterns. Patient was last seen 2 months ago.   DMII: Check CBGs but remain unctonrolled. FASTING cbgs 180-200. Stayed at 65u lantus and did not titrate up as I had advised as he is really not interested in having to administer twice a day as I advised he do at a higher dose. He also has no interest in increasing metformin as I have recommended several times prior as he is adamant that higher doses prior have made absolutely no impact on his CBGs.  Tried victoza prior but it made him stop biting his fingernails which weirded him out - made him uncomfortable that a diabetic medicine was somehow working on his brain to change lifelong habit - didn't feel like the same person so he stopped the Victoza despite otherwise having a decent response. Has failed Januvia and glipizide prior. He is drinking water, coffee, diet sodas as well as some diluted sweet tea (though made with real sugar), and 1-2 bottles of red wine on a daily basis but he is very convinced this is not negatively impacting his cbgs as times when he has but these things out cbgs stay about the same or he replaces them with foods or stress that make his cbgs even higher. He admits that he does eat a fair amount of potato chips which do raise his cbgs. He sees his eye doctor annually with no history of retinopathy. His micro albumin was normal 9 months prior. He is also on lisinopril.  At visit 2 mos ago: Patient reports still on Lantus 65 units. He checks his sugars in the morning, and it runs from 70 to 200, usually higher than evening readings. He stopped checking evening readings. He  denies any symptomatic lows. His highest reading noted is 205, but when eating poorly. He eats dinner around 6:00-7:00PM - last night's dinner was spaghetti. He usually eats chicken, prepared in frying pan or stir fry. He usually sleeps about 10:00PM-12:00AM. He reports his biggest meal is usually lunch. He also reports occasional pressuring pain in his feet when he's laying down. He's changed different shoes with relief.   Sometimes his legs feel cold from mid calf distally. his skin over lower extremities would sometimes feel uncomfortable when he rubs it.   HTN:   Does monitor BP at home. At last visit we stopped procardia 60which he had been taking qod due to persistent lower ext edema (had had severe edema with amlodipine prior) and started lasix 40 w/ K 20. Pedal edema has now resolved so he never needed to try the compression hose. However his blood pressures at home increased to 378H-885O systolic so we started patient on hydralazine several times a day. Patient thought that on the hydralazine for a week his blood pressure actually got a little worse with 140s-160s/ 90s and pulse in the 70s. Then he had several unprovoked episodes of shortness of breath at rest and some were accompanied by headaches and radiated down his arms. Apparently had similar experiences a side effect to a blood pressure medicine prior so he stopped the hydralazine and  has not had any recurrence of symptoms since. Then we started patient on diltiazem instead. SBP 130s-140s on the diltiazem - much improved.    Kidneys nml  GU: Trouble voiding. Is having to sit down to pee. Has the urge but has trouble getting his stream started. He drinks plenty of fluids throughout the day. Has a set regimen with water, coffee, half/half sweet and unsweet tea, and several diet Mountain Dew's daily. Then he has several glasses of wine a night.  HLD: His lipid panel last year showed triglycerides at 540 and total cholesterol of 206.    He takes  Crestor qod due to leg cramping pain.    He had normal stress test with cardiologist in May 2017, showed EF 47% mildly decreased but no risk of ischemia.  Has SHoB with even minimal exertion - deconditioned. He has occasional dizziness when changing position from sitting to standing. No orthopnea.  Cardiologist Dr. Tamala Julian noted: "He had coronary angiography in 2011. There was irregularity within the LAD with up to 30% narrowing.  The myocardial perfusion imaging that was done recently does not demonstrate any evidence of ischemia.  Continuous chest discomfort is unlikely to be related to ischemia.   Therefore my recommendation is continued conservative management of risk factors. LDL less than 30. Blood pressure controlled to 130/90 mmHg or less, exercise, weight loss, treatment of sleep apnea present, and glycemic control. If he continues to have symptoms or failure is further concern, I would not repeat a nuclear study, but rather go straight to coronary angiography to decide the matter. "  He is still using cpap at night.   Throbbing toe pain when he wakes in the morning which relief when he wiggles them. standing helps. Sometimes feels cold from mid calf distally Gets random cramping of his right ulnar-side fingers which resolves with stretching.   Very bad gas. Doesn't burp due to lap band so lots of flatulance..  No gerd/indigestion, cramping.  kellogs miniwheats for breakfast keeps his bowels normal.  Adjustment d/o: He has been under stress in the past few years. 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 She had access to the company credit card in the company baked count. She was maxing out the company credit card almost every 1-2 months and pain off immediately so was never aware. Now him and his wife have had to rebuild and they don't trust other people to help them and so instead of winding down as they  expected to at this point in life, they're both having to work harder than ever. 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 Occasionally at night when he can't sleep his thoughts turned very dark. However no SI or plan and readily contracts for safety. Sometimes takes benadryl to help sleep. He tried an antidepressant years prior- perhaps prozac - but made him not care as much about anything and he wasn't able to ejaculate despite good libido and erectile ability. He is really not interested in trying another antidepressant.  Chronic right shoulder pain: 10 yrs ago tore his rotator cuff Bjorn Loser - PT - is fantastic and has had 10 years myofascial release treatments by him but does not take insurance so unable to return  for the past several yrs (since his Environmental health practitioner embezzled from him).  Also has chronic low back pain from 3 levels of DDD/arthritis made worse by weight and very sedentary lifestyle. No exercise. arthritis in his back from 2-3 ruptured disks 10 years ago. He also had torn rotator cuff, now into more discomfort in his right bicep. He has pain over the central lower back. He denies shooting pain down into his legs. He had MRI in 2011 that showed L3-S1 was spurred or narrowed.  Because of this, at one point a provider (reports Dr. Karma Greaser) recommended he be maintained on narcotics for a while after a surgery. He reports he was taking dramatically less than rx'ed- was just taking 5-7.5mg  qhs x 30d and this low dose worked beautifully for his pain but even with this low levels did notice some w/d symptoms after he decided to get off them so doesn't think he would be a good candidate for chronic pain medicine - much prefers to have a bottle of red wine which help control pain, help him relax and sleep well and  he thinks going without would cause more elev BP with pain, poor sleep, etc.  He does not want to stop having his wine at night which he admits might be viewed by some as a problem as it is more than the recommended allowance but he never drinks to get drunk, rarely drinks more than his 4-6 glasses, and never drinks earlier than 4 pm and always stops drinking at 8 pm to go to bed sev eral hours later so he does not view it as a problem. Reports his use has not changed in > 20 years. This is the same as he reported at his last visit: "Regarding his red wine consumption, he has 0 urges throughout the day, but when 4:00PM-8:00PM, he can sleep easier if he drinks 5-6 glasses of red wine. He would rather continue this than going back to oxycodone. "  hives in an ER visit.   Second treated with benadryl. Allergy tested showed oak leaves.  Sev wks ago had tons of hives on trunk and arms.  He does have epi-pen for this in case (?)  Past Medical History:  Diagnosis Date  . Arthritis   . Diabetes mellitus   . Difficult intubation 04/05/2014  . Dyslipidemia   . Family history of anesthesia complication    pt states took days for his father to awaken after mask was used   . GERD (gastroesophageal reflux disease)    has been having acid reflux since recent endoscopy   . H/O hiatal hernia    repair with lap band, now has ventral hernia midline abd  . Hyperlipidemia   . Hypertension   . Morbid obesity ([place])   . OSA (obstructive sleep apnea)   . Rotator cuff tear   . Seasonal allergies   . Shortness of breath    pt states related to high BP meds with extended walking or climbling stairs  . Sleep apnea    Past Surgical History:  Procedure Laterality Date  . CARDIAC CATHETERIZATION     approx 3 years ago   . COLONOSCOPY WITH PROPOFOL N/A 03/10/2015   Procedure: COLONOSCOPY WITH PROPOFOL;  Surgeon: Milus Banister, MD;  Location: [facility];  Service: Endoscopy;  Laterality: N/A;  . colonscopy       2012  . ESOPHAGOGASTRODUODENOSCOPY (EGD) WITH PROPOFOL N/A 03/10/2015   Procedure: ESOPHAGOGASTRODUODENOSCOPY (EGD) WITH PROPOFOL;  Surgeon: Quillian Quince  Merrily Brittle, MD;  Location: [facility];  Service: Endoscopy;  Laterality: N/A;  . HERNIA REPAIR     with gastric banding  . LAPAROSCOPIC GASTRIC BANDING N/A 04/05/2014   Procedure: LAPAROSCOPIC GASTRIC BANDING;  Surgeon: Pedro Earls, MD;  Location: [facility];  Service: General;  Laterality: N/A;  . MENISCUS REPAIR Left    left knee torn meniscus  . VASECTOMY     Current Outpatient Prescriptions on File Prior to Visit  Medication Sig Dispense Refill  . Ascorbic Acid (VITAMIN C) 100 MG tablet Take 500 mg by mouth once.     Marland Kitchen aspirin 81 MG tablet Take 81 mg by mouth every morning.     Marland Kitchen BIOTIN PO Take 1 tablet by mouth daily.     . Cyanocobalamin 2500 MCG CHEW Chew 1 tablet by mouth daily.    Marland Kitchen diltiazem (DILTIAZEM CD) 240 MG 24 hr capsule Take 1 capsule (240 mg total) by mouth daily. 30 capsule 1  . fluticasone (FLONASE) 50 MCG/ACT nasal spray Place 1 spray into both nostrils daily as needed for allergies or rhinitis.    . furosemide (LASIX) 40 MG tablet Take 1 tablet (40 mg total) by mouth daily. 30 tablet 3  . glucose blood (TRUETEST TEST) test strip Use as instructed 100 each 2  . ibuprofen (ADVIL,MOTRIN) 200 MG tablet Take 400-600 mg by mouth once as needed for headache or mild pain.    . Insulin Glargine (LANTUS SOLOSTAR) 100 UNIT/ML Solostar Pen Inject 70 Units into the skin daily at 10 pm. 60 mL 1  . Insulin Pen Needle (B-D UF III MINI PEN NEEDLES) 31G X 5 MM MISC USE AS DIRECTED ONCE A DAY 100 each 0  . lisinopril-hydrochlorothiazide (PRINZIDE,ZESTORETIC) 20-12.5 MG tablet Take 2 tablets by mouth every morning. 180 tablet 1  . metoprolol succinate (TOPROL-XL) 100 MG 24 hr tablet Take 1 tablet (100 mg total) by mouth daily. Take with or immediately following a meal. 90 tablet 1  . Multiple Vitamin (MULTIVITAMIN) tablet Take 1 tablet by  mouth every morning.     . potassium chloride SA (K-DUR,KLOR-CON) 20 MEQ tablet Take 1 tablet (20 mEq total) by mouth daily. 30 tablet 3  . Probiotic Product (PROBIOTIC DAILY PO) Take 1 capsule by mouth daily.     . rosuvastatin (CRESTOR) 20 MG tablet Take 1 tablet (20 mg total) by mouth every other day. 90 tablet 1   No current facility-administered medications on file prior to visit.    Allergies  Allergen Reactions  . Erythromycin Nausea Only  . Peanut-Containing Drug Products     Hives   . Shellfish Allergy     Leg swelling    Family History  Problem Relation Age of Onset  . Clotting disorder Mother     lung clot  . Mesothelioma Father   . Hypertension Brother   . Skin cancer Brother   . Colon cancer Neg Hx    Social History   Social History  . Marital status: Married    Spouse name: N/A  . Number of children: 2  . Years of education: N/A   Occupational History  . owner     [place] History Main Topics  . Smoking status: Former Smoker    Packs/day: 4.00    Years: 20.00    Types: Cigarettes    Start date: 07/19/1973    Quit date: 12/13/1991  . Smokeless tobacco: Never Used  . Alcohol use 1.8 - 3.0  oz/week    3 - 5 Glasses of wine per week     Comment: takes 3 to 5 glasses of wine nightly   . Drug use: No  . Sexual activity: Not Asked   Other Topics Concern  . None   Social History Narrative  . None   Depression screen [facility] 2/9 08/27/2016 06/30/2016 01/03/2016 09/07/2015 02/09/2015  Decreased Interest 0 0 0 0 0  Down, Depressed, Hopeless 0 0 0 0 0  PHQ - 2 Score 0 0 0 0 0     Review of Systems See hpi    Objective:   Physical Exam  Constitutional: He is oriented to person, place, and time. He appears well-developed and well-nourished. No distress.  HENT:  Head: Normocephalic and atraumatic.  Eyes: Conjunctivae are normal. Pupils are equal, round, and reactive to light. No scleral icterus.  Neck: Normal range of motion. Neck supple. No  thyromegaly present.  Cardiovascular: Normal rate, regular rhythm, normal heart sounds and intact distal pulses.   Pulmonary/Chest: Effort normal and breath sounds normal. No respiratory distress.  Musculoskeletal: He exhibits no edema.  Lymphadenopathy:    He has no cervical adenopathy.  Neurological: He is alert and oriented to person, place, and time.  Skin: Skin is warm and dry. He is not diaphoretic.  Psychiatric: He has a normal mood and affect. His behavior is normal.      BP 130/81 (BP Location: Right Arm, Patient Position: Sitting, Cuff Size: Large)   Pulse 72   Temp 98.2 F (36.8 C) (Oral)   Resp 18   Ht 6' (1.829 m)   Wt 298 lb 12.8 oz (135.5 kg)   SpO2 96%   BMI 40.52 kg/m      Assessment & Plan:   1. 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  Rec endocrine referral but pt declines for now. Refuses to increase metformin dose.  2. Essential hypertension - bp recently improved on diltiazem but has been difficult to control with the meds he can tolerate. Have offered pt nephrology referral for further help with his prior but doing better on dilt so cont current regimen for now.  3. Hyperlipidemia, unspecified hyperlipidemia type - not fasting today. Goal LDL <30 per cardiology Dr. Tamala Julian. Had low risk stress test last yr. Suspect current sxs due to sedentary lifestyle and severe deconditioning but if persist he will need to return to cardiology for cath.  4. Benign prostatic hyperplasia (BPH) with straining on urination - Start trial of doxazosin qhs - maybe this will help bp as well. Will need to titrate up but as already endorsing orthostatic sxs occ will go slowly.    Orders Placed This Encounter  Procedures  . Fructosamine  . Comprehensive metabolic panel  . PSA     Meds ordered this encounter  Medications  . doxazosin (CARDURA) 2 MG tablet    Sig: Take 1 tablet (2 mg total) by mouth daily. Increase to 2 tabs if tolerated after 3 weeks    Dispense:  60 tablet    Refill:  3  . Insulin Glargine-Lixisenatide (SOLIQUA) 100-33 UNT-MCG/ML SOPN    Sig: Inject 0.6 mLs into the skin daily.    Dispense:  6 pen    Refill:  3  . metFORMIN (GLUCOPHAGE) 500 MG tablet  Sig: Take 1 tablet (500 mg total) by mouth daily with breakfast.    Dispense:  360 tablet    Refill:  1   Over 40 min spent in face-to-face evaluation of and consultation with patient and coordination of care.  Over 50% of this time was spent counseling this patient.  [name], M.D.  Primary Care at [facility] [address] [place], [place] [address] [phone] phone [phone] fax  10/05/16 9:27 PM

## 2016-08-28 ENCOUNTER — Telehealth: Payer: Self-pay

## 2016-08-28 LAB — COMPREHENSIVE METABOLIC PANEL
ALT: 44 IU/L (ref 0–44)
AST: 47 IU/L — ABNORMAL HIGH (ref 0–40)
Albumin/Globulin Ratio: 2 (ref 1.2–2.2)
Albumin: 4.4 g/dL (ref 3.6–4.8)
Alkaline Phosphatase: 54 IU/L (ref 39–117)
BUN/Creatinine Ratio: 10 (ref 10–24)
BUN: 13 mg/dL (ref 8–27)
Bilirubin Total: 0.6 mg/dL (ref 0.0–1.2)
CO2: 29 mmol/L (ref 18–29)
Calcium: 9.1 mg/dL (ref 8.6–10.2)
Chloride: 88 mmol/L — ABNORMAL LOW (ref 96–106)
Creatinine, Ser: 1.26 mg/dL (ref 0.76–1.27)
GFR calc Af Amer: 71 mL/min/{1.73_m2} (ref >59)
GFR calc non Af Amer: 61 mL/min/{1.73_m2} (ref >59)
Globulin, Total: 2.2 g/dL (ref 1.5–4.5)
Glucose: 211 mg/dL — ABNORMAL HIGH (ref 65–99)
Potassium: 3.2 mmol/L — ABNORMAL LOW (ref 3.5–5.2)
Sodium: 135 mmol/L (ref 134–144)
Total Protein: 6.6 g/dL (ref 6.0–8.5)

## 2016-08-28 LAB — FRUCTOSAMINE: Fructosamine: 341 umol/L — ABNORMAL HIGH (ref 0–285)

## 2016-08-28 LAB — PSA: Prostate Specific Ag, Serum: 1.4 ng/mL (ref 0.0–4.0)

## 2016-08-28 NOTE — Telephone Encounter (Signed)
soliqua pa needed  [id number] key

## 2016-08-28 NOTE — Telephone Encounter (Signed)
Please review

## 2016-08-29 ENCOUNTER — Encounter: Payer: Self-pay | Admitting: Family Medicine

## 2016-08-29 NOTE — Telephone Encounter (Signed)
Approvedtoday  Effective from 08/29/2016 through 08/28/2017 Pt advised

## 2016-09-12 ENCOUNTER — Encounter: Payer: Self-pay | Admitting: Family Medicine

## 2016-09-22 ENCOUNTER — Other Ambulatory Visit: Payer: Self-pay

## 2016-09-22 MED ORDER — INSULIN PEN NEEDLE 31G X 5 MM MISC: 0 refills | Status: DC

## 2016-10-05 ENCOUNTER — Other Ambulatory Visit: Payer: Self-pay | Admitting: Family Medicine

## 2016-10-09 MED ORDER — POTASSIUM CHLORIDE CRYS ER 20 MEQ PO TBCR: 20.0000 meq | EXTENDED_RELEASE_TABLET | Freq: Every day | ORAL | 0 refills | Status: DC

## 2016-10-10 ENCOUNTER — Encounter: Payer: Self-pay | Admitting: Family Medicine

## 2016-10-11 ENCOUNTER — Encounter: Payer: Self-pay | Admitting: Family Medicine

## 2016-10-11 DIAGNOSIS — I10 Essential (primary) hypertension: Secondary | ICD-10-CM

## 2016-10-11 MED ORDER — INSULIN GLARGINE-LIXISENATIDE 100-33 UNT-MCG/ML ~~LOC~~ SOPN: 0.6000 mL | PEN_INJECTOR | Freq: Every day | SUBCUTANEOUS | 1 refills | Status: DC

## 2016-10-11 MED ORDER — FUROSEMIDE 40 MG PO TABS: 40.0000 mg | ORAL_TABLET | Freq: Every day | ORAL | 1 refills | Status: DC

## 2016-10-11 MED ORDER — DILTIAZEM HCL ER COATED BEADS 240 MG PO CP24: 240.0000 mg | ORAL_CAPSULE | Freq: Every day | ORAL | 1 refills | Status: DC

## 2016-10-11 MED ORDER — METOPROLOL SUCCINATE ER 100 MG PO TB24: 100.0000 mg | ORAL_TABLET | Freq: Every day | ORAL | 1 refills | Status: DC

## 2016-10-11 NOTE — Telephone Encounter (Signed)
Patient requests 90-180 day supply of meds.  Meds ordered this encounter  Medications  . diltiazem (CARTIA XT) 240 MG 24 hr capsule    Sig: Take 1 capsule (240 mg total) by mouth daily.    Dispense:  90 capsule    Refill:  1    Order Specific Question:   Supervising Provider    Answer:   SHAW, EVA N [[id number]]  . furosemide (LASIX) 40 MG tablet    Sig: Take 1 tablet (40 mg total) by mouth daily.    Dispense:  90 tablet    Refill:  1    Order Specific Question:   Supervising Provider    Answer:   SHAW, EVA N [[id number]]  . Insulin Glargine-Lixisenatide (SOLIQUA) 100-33 UNT-MCG/ML SOPN    Sig: Inject 0.6 mLs into the skin daily.    Dispense:  18 pen    Refill:  1    Please dispense 90-day supply, RF x 1    Order Specific Question:   Supervising Provider    Answer:   SHAW, EVA N [[id number]]  . metoprolol succinate (TOPROL-XL) 100 MG 24 hr tablet    Sig: Take 1 tablet (100 mg total) by mouth daily. Take with or immediately following a meal.    Dispense:  90 tablet    Refill:  1    Order Specific Question:   Supervising Provider    Answer:   Brigitte Pulse, EVA N [[id number]]

## 2016-10-24 ENCOUNTER — Telehealth: Payer: Self-pay

## 2016-10-24 NOTE — Telephone Encounter (Signed)
Received denial on patient's soliqua today. Preferred medications are glipize XR and Lantus solstar.  Please advise next step.

## 2016-10-27 NOTE — Telephone Encounter (Signed)
[name] is covered at 5 pens for every 30d so all that we need to do is adjust [name] amount dispensed and resubmit which I have done.

## 2016-11-16 ENCOUNTER — Encounter: Payer: Self-pay | Admitting: Family Medicine

## 2016-11-17 ENCOUNTER — Ambulatory Visit (INDEPENDENT_AMBULATORY_CARE_PROVIDER_SITE_OTHER): Payer: BLUE CROSS/BLUE SHIELD

## 2016-11-17 ENCOUNTER — Encounter: Payer: Self-pay | Admitting: Family Medicine

## 2016-11-17 ENCOUNTER — Ambulatory Visit (INDEPENDENT_AMBULATORY_CARE_PROVIDER_SITE_OTHER): Payer: BLUE CROSS/BLUE SHIELD | Admitting: Family Medicine

## 2016-11-17 VITALS — BP 107/68 | HR 84 | Temp 98.3°F | Resp 16 | Ht 71.0 in | Wt 293.0 lb

## 2016-11-17 DIAGNOSIS — H65191 Other acute nonsuppurative otitis media, right ear: Secondary | ICD-10-CM

## 2016-11-17 DIAGNOSIS — R1084 Generalized abdominal pain: Secondary | ICD-10-CM

## 2016-11-17 DIAGNOSIS — M545 Low back pain: Secondary | ICD-10-CM | POA: Diagnosis not present

## 2016-11-17 DIAGNOSIS — R110 Nausea: Secondary | ICD-10-CM | POA: Diagnosis not present

## 2016-11-17 DIAGNOSIS — R197 Diarrhea, unspecified: Secondary | ICD-10-CM

## 2016-11-17 DIAGNOSIS — E1165 Type 2 diabetes mellitus with hyperglycemia: Secondary | ICD-10-CM

## 2016-11-17 DIAGNOSIS — G8929 Other chronic pain: Secondary | ICD-10-CM

## 2016-11-17 DIAGNOSIS — Z794 Long term (current) use of insulin: Secondary | ICD-10-CM

## 2016-11-17 DIAGNOSIS — IMO0002 Reserved for concepts with insufficient information to code with codable children: Secondary | ICD-10-CM

## 2016-11-17 LAB — POCT CBC
Granulocyte percent: 69.1 %G (ref 37–80)
HCT, POC: 38.4 % — AB (ref 43.5–53.7)
Hemoglobin: 15.2 g/dL (ref 14.1–18.1)
Lymph, poc: 1.7 (ref 0.6–3.4)
MCH, POC: 37.1 pg — AB (ref 27–31.2)
MCHC: 39.6 g/dL — AB (ref 31.8–35.4)
MCV: 93.6 fL (ref 80–97)
MID (cbc): 0.3 (ref 0–0.9)
MPV: 6 fL (ref 0–99.8)
POC Granulocyte: 4.6 (ref 2–6.9)
POC LYMPH PERCENT: 25.8 %L (ref 10–50)
POC MID %: 5.1 %M (ref 0–12)
Platelet Count, POC: 232 10*3/uL (ref 142–424)
RBC: 4.1 M/uL — AB (ref 4.69–6.13)
RDW, POC: 12.8 %
WBC: 6.7 10*3/uL (ref 4.6–10.2)

## 2016-11-17 LAB — POCT SEDIMENTATION RATE: POCT SED RATE: 20 mm/hr (ref 0–22)

## 2016-11-17 LAB — POCT GLYCOSYLATED HEMOGLOBIN (HGB A1C): Hemoglobin A1C: 7.2

## 2016-11-17 LAB — GLUCOSE, POCT (MANUAL RESULT ENTRY): POC Glucose: 205 mg/dl — AB (ref 70–99)

## 2016-11-17 MED ORDER — FLUTICASONE PROPIONATE 50 MCG/ACT NA SUSP: 2.0000 | Freq: Every day | NASAL | 2 refills | Status: DC

## 2016-11-17 NOTE — Patient Instructions (Signed)
Food Choices to Help Relieve Diarrhea, Adult When you have diarrhea, the foods you eat and your eating habits are very important. Choosing the right foods and drinks can help:  Relieve diarrhea.  Replace lost fluids and nutrients.  Prevent dehydration.  What general guidelines should I follow? Relieving diarrhea  Choose foods with less than 2 g or .07 oz. of fiber per serving.  Limit fats to less than 8 tsp (38 g or 1.34 oz.) a day.  Avoid the following: ? Foods and beverages sweetened with high-fructose corn syrup, honey, or sugar alcohols such as xylitol, sorbitol, and mannitol. ? Foods that contain a lot of fat or sugar. ? Fried, greasy, or spicy foods. ? High-fiber grains, breads, and cereals. ? Raw fruits and vegetables.  Eat foods that are rich in probiotics. These foods include dairy products such as yogurt and fermented milk products. They help increase healthy bacteria in the stomach and intestines (gastrointestinal tract, or GI tract).  If you have lactose intolerance, avoid dairy products. These may make your diarrhea worse.  Take medicine to help stop diarrhea (antidiarrheal medicine) only as told by your health care provider. Replacing nutrients  Eat small meals or snacks every 3-4 hours.  Eat bland foods, such as white rice, toast, or baked potato, until your diarrhea starts to get better. Gradually reintroduce nutrient-rich foods as tolerated or as told by your health care provider. This includes: ? Well-cooked protein foods. ? Peeled, seeded, and soft-cooked fruits and vegetables. ? Low-fat dairy products.  Take vitamin and mineral supplements as told by your health care provider. Preventing dehydration   Start by sipping water or a special solution to prevent dehydration (oral rehydration solution, ORS). Urine that is clear or pale yellow means that you are getting enough fluid.  Try to drink at least 8-10 cups of fluid each day to help replace lost  fluids.  You may add other liquids in addition to water, such as clear juice or decaffeinated sports drinks, as tolerated or as told by your health care provider.  Avoid drinks with caffeine, such as coffee, tea, or soft drinks.  Avoid alcohol. What foods are recommended? The items listed may not be a complete list. Talk with your health care provider about what dietary choices are best for you. Grains White rice. White, French, or pita breads (fresh or toasted), including plain rolls, buns, or bagels. White pasta. Saltine, soda, or graham crackers. Pretzels. Low-fiber cereal. Cooked cereals made with water (such as cornmeal, farina, or cream cereals). Plain muffins. Matzo. Melba toast. Zwieback. Vegetables Potatoes (without the skin). Most well-cooked and canned vegetables without skins or seeds. Tender lettuce. Fruits Apple sauce. Fruits canned in juice. Cooked apricots, cherries, grapefruit, peaches, pears, or plums. Fresh bananas and cantaloupe. Meats and other protein foods Baked or boiled chicken. Eggs. Tofu. Fish. Seafood. Smooth nut butters. Ground or well-cooked tender beef, ham, veal, lamb, pork, or poultry. Dairy Plain yogurt, kefir, and unsweetened liquid yogurt. Lactose-free milk, buttermilk, skim milk, or soy milk. Low-fat or nonfat hard cheese. Beverages Water. Low-calorie sports drinks. Fruit juices without pulp. Strained tomato and vegetable juices. Decaffeinated teas. Sugar-free beverages not sweetened with sugar alcohols. Oral rehydration solutions, if approved by your health care provider. Seasoning and other foods Bouillon, broth, or soups made from recommended foods. What foods are not recommended? The items listed may not be a complete list. Talk with your health care provider about what dietary choices are best for you. Grains Whole grain, whole wheat,   bran, or rye breads, rolls, pastas, and crackers. Wild or brown rice. Whole grain or bran cereals. Barley. Oats and  oatmeal. Corn tortillas or taco shells. Granola. Popcorn. Vegetables Raw vegetables. Fried vegetables. Cabbage, broccoli, Brussels sprouts, artichokes, baked beans, beet greens, corn, kale, legumes, peas, sweet potatoes, and yams. Potato skins. Cooked spinach and cabbage. Fruits Dried fruit, including raisins and dates. Raw fruits. Stewed or dried prunes. Canned fruits with syrup. Meat and other protein foods Fried or fatty meats. Deli meats. Chunky nut butters. Nuts and seeds. Beans and lentils. Bacon. Hot dogs. Sausage. Dairy High-fat cheeses. Whole milk, chocolate milk, and beverages made with milk, such as milk shakes. Half-and-half. Cream. sour cream. Ice cream. Beverages Caffeinated beverages (such as coffee, tea, soda, or energy drinks). Alcoholic beverages. Fruit juices with pulp. Prune juice. Soft drinks sweetened with high-fructose corn syrup or sugar alcohols. High-calorie sports drinks. Fats and oils Butter. Cream sauces. Margarine. Salad oils. Plain salad dressings. Olives. Avocados. Mayonnaise. Sweets and desserts Sweet rolls, doughnuts, and sweet breads. Sugar-free desserts sweetened with sugar alcohols such as xylitol and sorbitol. Seasoning and other foods Honey. Hot sauce. Chili powder. Gravy. Cream-based or milk-based soups. Pancakes and waffles. Summary  When you have diarrhea, the foods you eat and your eating habits are very important.  Make sure you get at least 8-10 cups of fluid each day, or enough to keep your urine clear or pale yellow.  Eat bland foods and gradually reintroduce healthy, nutrient-rich foods as tolerated, or as told by your health care provider.  Avoid high-fiber, fried, greasy, or spicy foods. This information is not intended to replace advice given to you by your health care provider. Make sure you discuss any questions you have with your health care provider. Document Released: 08/25/2003 Document Revised: 06/01/2016 Document Reviewed:  06/01/2016 Elsevier Interactive Patient Education  2017 Elsevier Inc.  

## 2016-11-17 NOTE — Progress Notes (Addendum)
Subjective:    Patient ID: [name], male    DOB: [birth year], 61 y.o.   MRN: [id number]  HPI Chief Complaint  Patient presents with  . Abdominal Pain    Diarrhea, nausea   . Ear Problem    Right ear pain  . Back Pain    kidneys problems, discuss referral     HPI Comments: [name] is a 61 y.o. male who presents to Primary Care at [facility] complaining of GI sxs of quickly alternating c/d for the past 2 mos and then the past 2 wks he will have 8-10hrs of nausea.  The gas (flatulence) is horrific and very odiferous. For the past several weeks he has had daily diarrhea as well as the nausea.  He did have lunch in [place] with ceviche about 2 wks ago.  He can vomit due to lapband but about 12 hrs he had diarrhea and dry heaves all that night and really never got back to normal since. Has had to leave work several times since then. He had tried tums and peptobismol for the nausea. Trying to push water for the dehydration from the diarrhea. Has been exhausted. Tried some imodium once for the diarrhea last wk and now developed severe constipation/almost feels like gastroparesis/obstruction with food backed into stomach which has now resolved.  He is having boryborygmi. He was using frosted miniwheats for the fiber to keep his bowel regular but thought that might be contributing to his increased bloating and gas.   He is still taking his potassium pill daily.  He stopped his lasix x 3d, then started every other day.  \He has an appt at [place]   Dr. Joelyn Oms suspected that his metoprolol was contributing to his fatigue so he has stopped it and BP has still been good.  BP 125/80 with pulse 70s If he has 6-7 glasses BP and pulse and higher 4-5 glasses everything is ok.  Doxazosin did not help at all with his urination.  The Leighton Parody is doing better at 60 than the lantus did at 70 He is loosing weight and having less appetite.  He is considering stopping EtOH as nows he does feel  tired sometimes. He wants to get down to 3-4 glasses. Still very stressed. He knows   He is having some difficulty and decreased hearing with his right ear. He is using qtips to take out.   Taking 1 tab probiotic in the morning.    Lab Results  Component Value Date   HGBA1C 7.2 11/17/2016   HGBA1C 8.3 06/30/2016   HGBA1C 7.2 01/03/2016   HGBA1C 7.8 09/07/2015   HGBA1C 7.8 05/18/2015      Patient Active Problem List   Diagnosis Date Noted  . Chest pain, pleuritic 01/20/2016  . Superficial bruising of chest wall 01/20/2016  . 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 Dyspnea 11/03/2015  . Lapband APS + hiatal hernia repair Oct 2015 04/05/2014  . Metabolic syndrome X 18/84/1660  . DM type 2, uncontrolled, with renal complications ([place]) 63/06/6008  . Hearing loss-aides 03/26/2012  . Back pain 12/26/2011  . OSA (obstructive sleep apnea) 12/26/2011  . Cervical disc disease 12/26/2011  . Hypertension 08/29/2011  . Hyperlipidemia 08/29/2011  . BMI 40.0-44.9, adult ([place]) 08/29/2011   Past Medical History:  Diagnosis Date  . Arthritis   . Diabetes mellitus   . Difficult intubation 04/05/2014  . Dyslipidemia   . Family history of anesthesia complication    pt states took days for  his father to awaken after mask was used   . GERD (gastroesophageal reflux disease)    has been having acid reflux since recent endoscopy   . H/O hiatal hernia    repair with lap band, now has ventral hernia midline abd  . Hyperlipidemia   . Hypertension   . Morbid obesity ([place])   . OSA (obstructive sleep apnea)   . Rotator cuff tear   . Seasonal allergies   . Shortness of breath    pt states related to high BP meds with extended walking or climbling stairs  . Sleep apnea    Past Surgical History:  Procedure Laterality Date  . CARDIAC CATHETERIZATION     approx 3 years ago   . COLONOSCOPY WITH PROPOFOL N/A 03/10/2015   Procedure: COLONOSCOPY WITH PROPOFOL;  Surgeon: Milus Banister, MD;  Location: [facility]  ENDOSCOPY;  Service: Endoscopy;  Laterality: N/A;  . colonscopy      2012  . ESOPHAGOGASTRODUODENOSCOPY (EGD) WITH PROPOFOL N/A 03/10/2015   Procedure: ESOPHAGOGASTRODUODENOSCOPY (EGD) WITH PROPOFOL;  Surgeon: Milus Banister, MD;  Location: [facility] ENDOSCOPY;  Service: Endoscopy;  Laterality: N/A;  . HERNIA REPAIR     with gastric banding  . LAPAROSCOPIC GASTRIC BANDING N/A 04/05/2014   Procedure: LAPAROSCOPIC GASTRIC BANDING;  Surgeon: Pedro Earls, MD;  Location: [facility] ORS;  Service: General;  Laterality: N/A;  . MENISCUS REPAIR Left    left knee torn meniscus  . VASECTOMY     Allergies  Allergen Reactions  . Erythromycin Nausea Only  . Penicillins Nausea And Vomiting    Upset stomach   Prior to Admission medications   Medication Sig Start Date End Date Taking? Authorizing Provider  Ascorbic Acid (VITAMIN C) 100 MG tablet Take 500 mg by mouth once.    Yes [provider]  aspirin 81 MG tablet Take 81 mg by mouth every morning.    Yes [provider]  Cyanocobalamin 2500 MCG CHEW Chew 1 tablet by mouth daily.   Yes [provider]  diltiazem (CARTIA XT) 240 MG 24 hr capsule Take 1 capsule (240 mg total) by mouth daily. 10/11/16  Yes Jeffery, Chelle, PA-C  doxazosin (CARDURA) 2 MG tablet Take 1 tablet (2 mg total) by mouth daily. Increase to 2 tabs if tolerated after 3 weeks 08/27/16  Yes Shawnee Knapp, MD  fluticasone [facility]) 50 MCG/ACT nasal spray Place 1 spray into both nostrils daily as needed for allergies or rhinitis.   Yes [provider]  furosemide (LASIX) 40 MG tablet Take 1 tablet (40 mg total) by mouth daily. 10/11/16  Yes Jeffery, Chelle, PA-C  glucose blood (TRUETEST TEST) test strip Use as instructed 03/05/15  Yes Leandrew Koyanagi, MD  ibuprofen (ADVIL,MOTRIN) 200 MG tablet Take 400-600 mg by mouth once as needed for headache or mild pain.   Yes [provider]  Insulin Glargine-Lixisenatide (SOLIQUA) 100-33 UNT-MCG/ML SOPN Inject  0.6 mLs into the skin daily. 10/11/16  Yes Jeffery, Chelle, PA-C  Insulin Pen Needle (B-D UF III MINI PEN NEEDLES) 31G X 5 MM MISC USE AS DIRECTED ONCE A DAY 09/22/16  Yes Shawnee Knapp, MD  lisinopril-hydrochlorothiazide (PRINZIDE,ZESTORETIC) 20-12.5 MG tablet Take 2 tablets by mouth every morning. 06/30/16  Yes Shawnee Knapp, MD  metFORMIN (GLUCOPHAGE) 500 MG tablet Take 1 tablet (500 mg total) by mouth daily with breakfast. 08/27/16  Yes Shawnee Knapp, MD  Multiple Vitamin (MULTIVITAMIN) tablet Take 1 tablet by mouth every morning.  Yes [provider]  potassium chloride SA (K-DUR,KLOR-CON) 20 MEQ tablet Take 1 tablet (20 mEq total) by mouth daily. 10/09/16  Yes Shawnee Knapp, MD  Probiotic Product (PROBIOTIC DAILY PO) Take 1 capsule by mouth daily.    Yes [provider]  rosuvastatin (CRESTOR) 20 MG tablet Take 1 tablet (20 mg total) by mouth every other day. 06/30/16  Yes Shawnee Knapp, MD  metoprolol succinate (TOPROL-XL) 100 MG 24 hr tablet Take 1 tablet (100 mg total) by mouth daily. Take with or immediately following a meal. Patient not taking: Reported on 11/17/2016 10/11/16   Harrison Mons, PA-C   Social History   Social History  . Marital status: Married    Spouse name: N/A  . Number of children: 2  . Years of education: N/A   Occupational History  . owner     Joice History Main Topics  . Smoking status: Former Smoker    Packs/day: 4.00    Years: 20.00    Types: Cigarettes    Start date: 07/19/1973    Quit date: 12/13/1991  . Smokeless tobacco: Never Used  . Alcohol use 1.8 - 3.0 oz/week    3 - 5 Glasses of wine per week     Comment: takes 3 to 5 glasses of wine nightly   . Drug use: No  . Sexual activity: Not on file   Other Topics Concern  . Not on file   Social History Narrative  . No narrative on file   Review of Systems Objective:  Physical Exam  Constitutional: He appears well-developed and well-nourished. No distress.  HENT:    Head: Normocephalic and atraumatic.  Neck: Normal range of motion. Neck supple. No thyromegaly present.  Cardiovascular: Normal rate, regular rhythm and normal heart sounds.   Pulmonary/Chest: Effort normal and breath sounds normal.  Abdominal: Soft. Normal appearance and bowel sounds are normal. He exhibits distension. He exhibits no mass. There is no hepatosplenomegaly (exam limited by body habitus). There is generalized tenderness. There is no rigidity, no rebound, no guarding and no CVA tenderness.  Lymphadenopathy:    He has no cervical adenopathy.  Skin: He is not diaphoretic.    Vitals:   11/17/16 1202  BP: 107/68  Pulse: 84  Resp: 16  Temp: 98.3 F (36.8 C)  TempSrc: Oral  SpO2: 96%  Weight: 293 lb (132.9 kg)  Height: 5\' 11"  (1.803 m)  Body mass index is 40.87 kg/m.   Dg Abd Acute W/chest  Result Date: 11/17/2016 CLINICAL DATA:  Abdominal pain EXAM: DG ABDOMEN ACUTE W/ 1V CHEST COMPARISON:  None. FINDINGS: Cardiac shadow is within normal limits. Elevation of the left hemidiaphragm is noted. Stomach is distended with air. Scattered large and small bowel gas is noted. No free air is seen. No obstructive changes are noted. Gastric lap band is noted in satisfactory position. Degenerative changes of the lumbar spine are seen. No other focal abnormality is noted. IMPRESSION: No definitive obstructive changes are seen.  No free air is noted. Electronically Signed   By: Inez Catalina M.D.   On: 11/17/2016 14:09    Results for orders placed or performed in visit on 11/17/16  Comprehensive metabolic panel  Result Value Ref Range   Glucose 189 (H) 65 - 99 mg/dL   BUN 12 8 - 27 mg/dL   Creatinine, Ser 1.51 (H) 0.76 - 1.27 mg/dL   GFR calc non Af Amer 49 (L) >59 mL/min/1.73   GFR  calc Af Amer 57 (L) >59 mL/min/1.73   BUN/Creatinine Ratio 8 (L) 10 - 24   Sodium 134 134 - 144 mmol/L   Potassium 3.5 3.5 - 5.2 mmol/L   Chloride 86 (L) 96 - 106 mmol/L   CO2 23 18 - 29 mmol/L   Calcium  9.6 8.6 - 10.2 mg/dL   Total Protein 6.5 6.0 - 8.5 g/dL   Albumin 4.4 3.6 - 4.8 g/dL   Globulin, Total 2.1 1.5 - 4.5 g/dL   Albumin/Globulin Ratio 2.1 1.2 - 2.2   Bilirubin Total 0.4 0.0 - 1.2 mg/dL   Alkaline Phosphatase 45 39 - 117 IU/L   AST 53 (H) 0 - 40 IU/L   ALT 39 0 - 44 IU/L  Lipase  Result Value Ref Range   Lipase 62 13 - 78 U/L  GI Profile, Stool, PCR  Result Value Ref Range   Campylobacter Not Detected Not Detected   C difficile toxin A/B Not Detected Not Detected   Plesiomonas shigelloides Not Detected Not Detected   Salmonella Not Detected Not Detected   Vibrio Not Detected Not Detected   Vibrio cholerae Not Detected Not Detected   Yersinia enterocolitica Not Detected Not Detected   Enteroaggregative E coli Not Detected Not Detected   Enteropathogenic E coli Not Detected Not Detected   Enterotoxigenic E coli Not Detected Not Detected   Shiga-toxin-producing E coli Not Detected Not Detected   E coli W546 Not applicable Not Detected   Shigella/Enteroinvasive E coli Not Detected Not Detected   Cryptosporidium Not Detected Not Detected   Cyclospora cayetanensis Not Detected Not Detected   Entamoeba histolytica Not Detected Not Detected   Giardia lamblia Not Detected Not Detected   Adenovirus F 40/41 Not Detected Not Detected   Astrovirus Not Detected Not Detected   Norovirus GI/GII Not Detected Not Detected   Rotavirus A Not Detected Not Detected   Sapovirus Not Detected Not Detected  Fecal lactoferrin, quant  Result Value Ref Range   Lactoferrin, Fecal, Quant. <1.00 0.00 - 7.24 ug/mL(g)  Microalbumin / creatinine urine ratio  Result Value Ref Range   Creatinine, Urine CANCELED mg/dL   Albumin, Urine CANCELED   POCT glycosylated hemoglobin (Hb A1C)  Result Value Ref Range   Hemoglobin A1C 7.2   POCT glucose (manual entry)  Result Value Ref Range   POC Glucose 205 (A) 70 - 99 mg/dl  POCT CBC  Result Value Ref Range   WBC 6.7 4.6 - 10.2 K/uL   Lymph, poc  1.7 0.6 - 3.4   POC LYMPH PERCENT 25.8 10 - 50 %L   MID (cbc) 0.3 0 - 0.9   POC MID % 5.1 0 - 12 %M   POC Granulocyte 4.6 2 - 6.9   Granulocyte percent 69.1 37 - 80 %G   RBC 4.10 (A) 4.69 - 6.13 M/uL   Hemoglobin 15.2 14.1 - 18.1 g/dL   HCT, POC 38.4 (A) 43.5 - 53.7 %   MCV 93.6 80 - 97 fL   MCH, POC 37.1 (A) 27 - 31.2 pg   MCHC 39.6 (A) 31.8 - 35.4 g/dL   RDW, POC 12.8 %   Platelet Count, POC 232 142 - 424 K/uL   MPV 6.0 0 - 99.8 fL  POCT SEDIMENTATION RATE  Result Value Ref Range   POCT SED RATE 20 0 - 22 mm/hr  POC Hemoccult Bld/Stl (3-Cd Home Screen)  Result Value Ref Range   Card #1 Date 11-19-16    Fecal Occult  Blood, POC Negative Negative   Card #2 Date 11-20-16    Card #2 Fecal Occult Blod, POC Negative    Card #3 Date 11-21-16    Card #3 Fecal Occult Blood, POC Negative      Assessment & Plan:  FAX LABS TO DR. SANFORD AT [facility].  1. Generalized abdominal pain   2. Nausea without vomiting   3. Diarrhea, unspecified type   4. Insulin dependent type 2 diabetes mellitus, uncontrolled ([place])   5. Chronic bilateral low back pain without sciatica   6. Acute middle ear effusion, right     Orders Placed This Encounter  Procedures  . DG Abd Acute W/Chest    Standing Status:   Future    Number of Occurrences:   1    Standing Expiration Date:   11/17/2017    Order Specific Question:   Reason for exam:    Answer:   2 weeks of worsening vomiting diarrhea after 2 mos of alternating constipation/diarrhea/bloating/inreased flatulence. h/o lap band    Order Specific Question:   Preferred imaging location?    Answer:   External  . Comprehensive metabolic panel  . Lipase  . GI Profile, Stool, PCR  . Fecal lactoferrin, quant  . Microalbumin / creatinine urine ratio  . Ambulatory referral to ENT    Referral Priority:   Routine    Referral Type:   Consultation    Referral Reason:   Specialty Services Required    Requested Specialty:   Otolaryngology    Number of Visits Requested:   1   . POCT glycosylated hemoglobin (Hb A1C)  . POCT glucose (manual entry)  . POCT CBC  . POCT SEDIMENTATION RATE  . POC Hemoccult Bld/Stl (3-Cd Home Screen)    Standing Status:   Future    Standing Expiration Date:   11/17/2017    Meds ordered this encounter  Medications  . fluticasone (FLONASE) 50 MCG/ACT nasal spray    Sig: Place 2 sprays into both nostrils daily.    Dispense:  16 g    Refill:  2     [name], M.D.  Primary Care at [facility] [address] [place], [place] [address] [phone] phone [phone] fax  11/19/16 11:57 PM

## 2016-11-19 LAB — COMPREHENSIVE METABOLIC PANEL
ALT: 39 IU/L (ref 0–44)
AST: 53 IU/L — ABNORMAL HIGH (ref 0–40)
Albumin/Globulin Ratio: 2.1 (ref 1.2–2.2)
Albumin: 4.4 g/dL (ref 3.6–4.8)
Alkaline Phosphatase: 45 IU/L (ref 39–117)
BUN/Creatinine Ratio: 8 — ABNORMAL LOW (ref 10–24)
BUN: 12 mg/dL (ref 8–27)
Bilirubin Total: 0.4 mg/dL (ref 0.0–1.2)
CO2: 23 mmol/L (ref 18–29)
Calcium: 9.6 mg/dL (ref 8.6–10.2)
Chloride: 86 mmol/L — ABNORMAL LOW (ref 96–106)
Creatinine, Ser: 1.51 mg/dL — ABNORMAL HIGH (ref 0.76–1.27)
GFR calc Af Amer: 57 mL/min/{1.73_m2} — ABNORMAL LOW (ref >59)
GFR calc non Af Amer: 49 mL/min/{1.73_m2} — ABNORMAL LOW (ref >59)
Globulin, Total: 2.1 g/dL (ref 1.5–4.5)
Glucose: 189 mg/dL — ABNORMAL HIGH (ref 65–99)
Potassium: 3.5 mmol/L (ref 3.5–5.2)
Sodium: 134 mmol/L (ref 134–144)
Total Protein: 6.5 g/dL (ref 6.0–8.5)

## 2016-11-19 LAB — MICROALBUMIN / CREATININE URINE RATIO

## 2016-11-19 LAB — LIPASE: Lipase: 62 U/L (ref 13–78)

## 2016-11-20 LAB — GI PROFILE, STOOL, PCR

## 2016-11-22 ENCOUNTER — Encounter: Payer: Self-pay | Admitting: Family Medicine

## 2016-11-22 LAB — POC HEMOCCULT BLD/STL (HOME/3-CARD/SCREEN)
Card #2 Fecal Occult Blod, POC: NEGATIVE
Card #3 Fecal Occult Blood, POC: NEGATIVE
Fecal Occult Blood, POC: NEGATIVE

## 2016-11-22 LAB — FECAL LACTOFERRIN, QUANT: Lactoferrin, Fecal, Quant.: <1 ug/mL(g) (ref 0.00–7.24)

## 2016-11-22 NOTE — Addendum Note (Signed)
Addended by: Gari Crown on: 11/22/2016 04:31 PM   Modules accepted: Orders

## 2016-11-25 ENCOUNTER — Encounter: Payer: Self-pay | Admitting: Family Medicine

## 2016-11-28 ENCOUNTER — Telehealth: Payer: Self-pay | Admitting: Family Medicine

## 2016-11-28 NOTE — Telephone Encounter (Signed)
Please advise. I am sure you can better explain then me.

## 2016-11-28 NOTE — Addendum Note (Signed)
Addended by: Shawnee Knapp on: 11/28/2016 11:08 PM   Modules accepted: Orders

## 2016-11-28 NOTE — Telephone Encounter (Signed)
DR SHAW PT CALLING WANTING YOU TO GO TO HIS MY CHART AND DOCUMENT ABOUT HIS LABS HE DO NOT UNDERSTAND WHAT ANY OF RESULTS MEAN HE ALSO SAYS THAT YOU CAN CALL HIM IS YOU WANT TO TO EXPLAIN LABS TO HIM

## 2016-11-28 NOTE — Telephone Encounter (Signed)
Will do. If pt calls again, please let pt know that I am on vacation this wk so only doing urgent work and will address these routine/minor abnml when I am back in the office next wk.

## 2016-11-29 ENCOUNTER — Telehealth: Payer: Self-pay | Admitting: Gastroenterology

## 2016-11-29 NOTE — Telephone Encounter (Signed)
The pt has been having daily diarrhea for 3 weeks. Had seafood in [place] the night prior to this starting.  His PCP did stool studies that were normal.  Pt has been scheduled to see Jessica on 12/05/16 at 3 pm

## 2016-11-30 NOTE — Progress Notes (Signed)
Lab reports resent.  Please let us know if they do not receive them and we will resend them again.

## 2016-12-01 ENCOUNTER — Encounter: Payer: Self-pay | Admitting: Family Medicine

## 2016-12-01 DIAGNOSIS — I2510 Atherosclerotic heart disease of native coronary artery without angina pectoris: Secondary | ICD-10-CM

## 2016-12-01 HISTORY — DX: Atherosclerotic heart disease of native coronary artery without angina pectoris: I25.10

## 2016-12-05 ENCOUNTER — Encounter: Payer: Self-pay | Admitting: Gastroenterology

## 2016-12-05 ENCOUNTER — Ambulatory Visit (INDEPENDENT_AMBULATORY_CARE_PROVIDER_SITE_OTHER): Payer: BLUE CROSS/BLUE SHIELD | Admitting: Gastroenterology

## 2016-12-05 VITALS — BP 120/76 | HR 76 | Ht 72.0 in | Wt 270.0 lb

## 2016-12-05 DIAGNOSIS — R197 Diarrhea, unspecified: Secondary | ICD-10-CM | POA: Diagnosis not present

## 2016-12-05 MED ORDER — RIFAXIMIN 550 MG PO TABS: 550.0000 mg | ORAL_TABLET | Freq: Two times a day (BID) | ORAL | 0 refills | Status: DC

## 2016-12-05 NOTE — Progress Notes (Signed)
I agree with the above note, plan 

## 2016-12-05 NOTE — Patient Instructions (Signed)
We have sent your demographic information and a prescription for Xifaxan to [facility]. This pharmacy is able to get medication approved through insurance and get you the lowest copay possible. If you have not heard from them within 1 week, please call our office at [phone] to let us know.  Continue your Probiotic.   Send a Mychart message with an update of your symptoms after starting Xifaxan.

## 2016-12-05 NOTE — Progress Notes (Signed)
12/05/2016 [name] [id number] Apr 16, 1956   HISTORY OF PRESENT ILLNESS:  This is a 61 year old male who is known to Dr. Ardis Hughs for colonoscopy in September 2016 at which time he was sent have 1 pedunculated polyp that was removed from the sigmoid colon and was a tubular adenoma on pathology.  He is here today with complaints of ongoing loose stools. He says that 5 weeks ago he ate some odd seafood at a restaurant in [place] for which included octopus, etc. That night he got violently ill with nausea and dry heaves; is unable to vomit due to his lap band, however. Also developed severe diarrhea that he described as pure liquid. Now he has continued with nausea, a lot of gas and bubbles/gurgling in his abdomen and stools alternating anywhere from liquid to soft and mushy. On June 18 he was started on some Cipro by an ENT physician for a right ear infection. He says that yesterday was the first day that he had a solid bowel movement 5 weeks. Today than he had one episode of mushy stools again. He says that he has a trip to probably planned for mid July and needs to have this straightened out by then.o abdominal pain or rectal bleeding.  Past Medical History:  Diagnosis Date  . Arthritis   . Diabetes mellitus   . Difficult intubation 04/05/2014  . Dyslipidemia   . Family history of anesthesia complication    pt states took days for his father to awaken after mask was used   . GERD (gastroesophageal reflux disease)    has been having acid reflux since recent endoscopy   . H/O hiatal hernia    repair with lap band, now has ventral hernia midline abd  . Hyperlipidemia   . Hypertension   . Morbid obesity ([place])   . OSA (obstructive sleep apnea)   . Rotator cuff tear   . Seasonal allergies   . Shortness of breath    pt states related to high BP meds with extended walking or climbling stairs  . Sleep apnea    Past Surgical History:  Procedure Laterality Date  . CARDIAC  CATHETERIZATION     approx 3 years ago   . COLONOSCOPY WITH PROPOFOL N/A 03/10/2015   Procedure: COLONOSCOPY WITH PROPOFOL;  Surgeon: Milus Banister, MD;  Location: [facility];  Service: Endoscopy;  Laterality: N/A;  . colonscopy      2012  . ESOPHAGOGASTRODUODENOSCOPY (EGD) WITH PROPOFOL N/A 03/10/2015   Procedure: ESOPHAGOGASTRODUODENOSCOPY (EGD) WITH PROPOFOL;  Surgeon: Milus Banister, MD;  Location: [facility];  Service: Endoscopy;  Laterality: N/A;  . HERNIA REPAIR     with gastric banding  . LAPAROSCOPIC GASTRIC BANDING N/A 04/05/2014   Procedure: LAPAROSCOPIC GASTRIC BANDING;  Surgeon: Pedro Earls, MD;  Location: [facility];  Service: General;  Laterality: N/A;  . MENISCUS REPAIR Left    left knee torn meniscus  . VASECTOMY      reports that he quit smoking about 24 years ago. His smoking use included Cigarettes. He started smoking about 43 years ago. He has a 80.00 pack-year smoking history. He has never used smokeless tobacco. He reports that he drinks about 1.8 - 3.0 oz of alcohol per week . He reports that he does not use drugs. family history includes Clotting disorder in his mother; Hypertension in his brother; Mesothelioma in his father; Skin cancer in his brother. Allergies  Allergen Reactions  . Erythromycin Nausea Only  .  Penicillins Nausea Only    Upset stomach      Outpatient Encounter Prescriptions as of 12/05/2016  Medication Sig  . Ascorbic Acid (VITAMIN C) 100 MG tablet Take 500 mg by mouth once.   Marland Kitchen aspirin 81 MG tablet Take 81 mg by mouth every morning.   . ciprofloxacin (CIPRO) 500 MG tablet Take 500 mg by mouth 2 (two) times daily.  . Cyanocobalamin 2500 MCG CHEW Chew 1 tablet by mouth daily.  Marland Kitchen diltiazem (CARTIA XT) 240 MG 24 hr capsule Take 1 capsule (240 mg total) by mouth daily.  . fluticasone (FLONASE) 50 MCG/ACT nasal spray Place 2 sprays into both nostrils daily.  . furosemide (LASIX) 40 MG tablet Take 1 tablet (40 mg total) by mouth daily.  (Patient taking differently: Take 40 mg by mouth every other day. )  . glucose blood (TRUETEST TEST) test strip Use as instructed  . ibuprofen (ADVIL,MOTRIN) 200 MG tablet Take 400-600 mg by mouth once as needed for headache or mild pain.  . Insulin Glargine-Lixisenatide (SOLIQUA) 100-33 UNT-MCG/ML SOPN Inject 0.6 mLs into the skin daily.  . Insulin Pen Needle (B-D UF III MINI PEN NEEDLES) 31G X 5 MM MISC USE AS DIRECTED ONCE A DAY  . lisinopril-hydrochlorothiazide (PRINZIDE,ZESTORETIC) 20-12.5 MG tablet Take 2 tablets by mouth every morning.  . metFORMIN (GLUCOPHAGE) 500 MG tablet Take 1 tablet (500 mg total) by mouth daily with breakfast.  . Multiple Vitamin (MULTIVITAMIN) tablet Take 1 tablet by mouth every morning.   . potassium chloride SA (K-DUR,KLOR-CON) 20 MEQ tablet Take 1 tablet (20 mEq total) by mouth daily.  . Probiotic Product (PROBIOTIC DAILY PO) Take 2 capsules by mouth daily.   . [DISCONTINUED] doxazosin (CARDURA) 2 MG tablet Take 1 tablet (2 mg total) by mouth daily. Increase to 2 tabs if tolerated after 3 weeks  . [DISCONTINUED] rosuvastatin (CRESTOR) 20 MG tablet Take 1 tablet (20 mg total) by mouth every other day.   No facility-administered encounter medications on file as of 12/05/2016.      REVIEW OF SYSTEMS  : All other systems reviewed and negative except where noted in the History of Present Illness.   PHYSICAL EXAM: BP 120/76   Pulse 76   Ht 6' (1.829 m)   Wt 270 lb (122.5 kg)   BMI 36.62 kg/m  General: Well developed white male in no acute distress Head: Normocephalic and atraumatic Eyes:  Sclerae anicteric, conjunctiva pink. Ears: Normal auditory acuity Lungs: Clear throughout to auscultation; no increased WOB. Heart: Regular rate and rhythm Abdomen: Soft, non-distended. Somewhat hyperactive bowel sounds.  Non-tender. Musculoskeletal: Symmetrical with no gross deformities  Skin: No lesions on visible extremities Extremities: No edema  Neurological:  Alert oriented x 4, grossly non-focal Psychological:  Alert and cooperative. 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  Will try a course of Xifaxan 550 mg TID for 14 days.  Continue daily probiotic.  Will call back or e-mail after completion of Xifaxan with an update on symptoms.  CC:  Shawnee Knapp, MD

## 2016-12-06 ENCOUNTER — Telehealth: Payer: Self-pay | Admitting: Gastroenterology

## 2016-12-06 NOTE — Telephone Encounter (Signed)
Records have been faxed to [facility]

## 2016-12-10 ENCOUNTER — Encounter: Payer: Self-pay | Admitting: Family Medicine

## 2016-12-10 ENCOUNTER — Ambulatory Visit (INDEPENDENT_AMBULATORY_CARE_PROVIDER_SITE_OTHER): Payer: BLUE CROSS/BLUE SHIELD | Admitting: Family Medicine

## 2016-12-10 VITALS — BP 134/87 | HR 66 | Temp 97.9°F | Resp 18 | Ht 72.0 in | Wt 296.4 lb

## 2016-12-10 DIAGNOSIS — H60391 Other infective otitis externa, right ear: Secondary | ICD-10-CM

## 2016-12-10 DIAGNOSIS — R6889 Other general symptoms and signs: Secondary | ICD-10-CM | POA: Diagnosis not present

## 2016-12-10 DIAGNOSIS — F518 Other sleep disorders not due to a substance or known physiological condition: Secondary | ICD-10-CM

## 2016-12-10 DIAGNOSIS — Z794 Long term (current) use of insulin: Secondary | ICD-10-CM | POA: Diagnosis not present

## 2016-12-10 DIAGNOSIS — E1165 Type 2 diabetes mellitus with hyperglycemia: Secondary | ICD-10-CM

## 2016-12-10 DIAGNOSIS — E1129 Type 2 diabetes mellitus with other diabetic kidney complication: Secondary | ICD-10-CM | POA: Diagnosis not present

## 2016-12-10 DIAGNOSIS — R710 Precipitous drop in hematocrit: Secondary | ICD-10-CM | POA: Diagnosis not present

## 2016-12-10 DIAGNOSIS — IMO0002 Reserved for concepts with insufficient information to code with codable children: Secondary | ICD-10-CM

## 2016-12-10 DIAGNOSIS — D649 Anemia, unspecified: Secondary | ICD-10-CM

## 2016-12-10 MED ORDER — FUROSEMIDE 20 MG PO TABS: 20.0000 mg | ORAL_TABLET | Freq: Every day | ORAL | 1 refills | Status: DC

## 2016-12-10 NOTE — Patient Instructions (Addendum)
Dr. Joelyn Oms (kidney doc) suspected that metoprolol was contributing to his fatigue so he has stopped it and BP has still been good.   Doxazosin did not help at all with urination so ok to stop.    IF you received an x-ray today, you will receive an invoice from [facility]. Please contact [facility] at [phone] with questions or concerns regarding your invoice.   IF you received labwork today, you will receive an invoice from [facility]. Please contact [facility] at [phone] with questions or concerns regarding your invoice.   Our billing staff will not be able to assist you with questions regarding bills from these companies.  You will be contacted with the lab results as soon as they are available. The fastest way to get your results is to activate your My Chart account. Instructions are located on the last page of this paperwork. If you have not heard from [place] regarding the results in 2 weeks, please contact this office.

## 2016-12-10 NOTE — Progress Notes (Addendum)
Subjective:    Patient ID: [name], male    DOB: 1955-12-03, 61 y.o.   MRN: [id number] Chief Complaint  Patient presents with  . Abdominal Pain    nausea x3weeks   . Follow-up    HPI  Mr. Dettman was seen by gastroenterology 5 days prior.  Ms. 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  He thinks maybe he couldn't throw it up because of his lapband and so has had to put up with it for 6-7 weeks. He has had all of the fluid removed from his lapband but it is at a restriction at baseline and he wonders if he should have it removed.   He was starting to get a little bit better but his ENT doctor Dr. Ernesto Rutherford started him on Cipro 500 bid x 10d and has gotten a significant amount of improvement.  His diarrhea has gotten a lot better. He did have a lot of otitis externa which he used drops for this > 5-6 years - Cortisporin he thinks.   He is taking lasix 40 qod and he would like change to 20 a day.  He was also taking  BP 130-145/80-90 by taking metoprolol and lasix qod.   Did not notice any improvement on doxazosin so he thinks he stopped this - he is not sure.  He would like to get repeat labs to check on his kidneys   CBGs have been doing better on the lantus   Toprol XL 100mg  qd was stopped on 6/2  Doxazosin did not help at all with his urination.  Is having increase realistic dreams and sometimes is getting confused about dreams.  Feels like sleep quality is the same - is using cpap as prescribed regularly and is definitely beneficial -sleeps much better with it with less daytime fatigue, morning headaches, snoring, etc.  Not exhausted. Only a little more tired. Things that he is remember he is now forgetting and so concerned about memory - new problem.   Dr. Joelyn Oms suspected that his metoprolol was contributing to his fatigue so he has stopped it and BP has still been  good.   Past Medical History:  Diagnosis Date  . Arthritis   . Diabetes mellitus   . Difficult intubation 04/05/2014  . Dyslipidemia   . Family history of anesthesia complication    pt states took days for his father to awaken after mask was used   . GERD (gastroesophageal reflux disease)    has been having acid reflux since recent endoscopy   . H/O hiatal hernia    repair with lap band, now has ventral hernia midline abd  . Hyperlipidemia   . Hypertension   . Morbid obesity ([place])   . OSA (obstructive sleep apnea)   . Rotator cuff tear   . Seasonal allergies   . Shortness of breath    pt states related to high BP meds with extended walking or climbling stairs  . Sleep apnea    Past Surgical History:  Procedure Laterality Date  . CARDIAC CATHETERIZATION     approx 3 years ago   . COLONOSCOPY WITH PROPOFOL N/A 03/10/2015   Procedure: COLONOSCOPY WITH PROPOFOL;  Surgeon: Milus Banister, MD;  [facility]: [facility];  Service: Endoscopy;  Laterality: N/A;  . colonscopy      2012  . ESOPHAGOGASTRODUODENOSCOPY (EGD) WITH PROPOFOL  N/A 03/10/2015   Procedure: ESOPHAGOGASTRODUODENOSCOPY (EGD) WITH PROPOFOL;  Surgeon: Milus Banister, MD;  Location: [facility];  Service: Endoscopy;  Laterality: N/A;  . HERNIA REPAIR     with gastric banding  . LAPAROSCOPIC GASTRIC BANDING N/A 04/05/2014   Procedure: LAPAROSCOPIC GASTRIC BANDING;  Surgeon: Pedro Earls, MD;  Location: [facility];  Service: General;  Laterality: N/A;  . MENISCUS REPAIR Left    left knee torn meniscus  . VASECTOMY     Current Outpatient Prescriptions on File Prior to Visit  Medication Sig Dispense Refill  . Ascorbic Acid (VITAMIN C) 100 MG tablet Take 500 mg by mouth once.     Marland Kitchen aspirin 81 MG tablet Take 81 mg by mouth every morning.     . ciprofloxacin (CIPRO) 500 MG tablet Take 500 mg by mouth 2 (two) times daily.    . Cyanocobalamin 2500 MCG CHEW Chew 1 tablet by mouth daily.    . fluticasone (FLONASE) 50  MCG/ACT nasal spray Place 2 sprays into both nostrils daily. 16 g 2  . glucose blood (TRUETEST TEST) test strip Use as instructed 100 each 2  . ibuprofen (ADVIL,MOTRIN) 200 MG tablet Take 400-600 mg by mouth once as needed for headache or mild pain.    . Insulin Glargine-Lixisenatide (SOLIQUA) 100-33 UNT-MCG/ML SOPN Inject 0.6 mLs into the skin daily. 18 pen 1  . Insulin Pen Needle (B-D UF III MINI PEN NEEDLES) 31G X 5 MM MISC USE AS DIRECTED ONCE A DAY 100 each 0  . lisinopril-hydrochlorothiazide (PRINZIDE,ZESTORETIC) 20-12.5 MG tablet Take 2 tablets by mouth every morning. 180 tablet 1  . metFORMIN (GLUCOPHAGE) 500 MG tablet Take 1 tablet (500 mg total) by mouth daily with breakfast. 360 tablet 1  . Multiple Vitamin (MULTIVITAMIN) tablet Take 1 tablet by mouth every morning.     . potassium chloride SA (K-DUR,KLOR-CON) 20 MEQ tablet Take 1 tablet (20 mEq total) by mouth daily. 90 tablet 0  . Probiotic Product (PROBIOTIC DAILY PO) Take 2 capsules by mouth daily.     . rifaximin (XIFAXAN) 550 MG TABS tablet Take 1 tablet (550 mg total) by mouth 2 (two) times daily. (Patient not taking: Reported on 12/10/2016) 42 tablet 0   No current facility-administered medications on file prior to visit.    Allergies  Allergen Reactions  . Erythromycin Nausea Only  . Penicillins Nausea Only    Upset stomach   Family History  Problem Relation Age of Onset  . Clotting disorder Mother        lung clot  . Mesothelioma Father   . Hypertension Brother   . Skin cancer Brother   . Colon cancer Neg Hx    Social History   Social History  . Marital status: Married    Spouse name: N/A  . Number of children: 2  . Years of education: N/A   Occupational History  . owner     Bear History Main Topics  . Smoking status: Former Smoker    Packs/day: 4.00    Years: 20.00    Types: Cigarettes    Start date: 07/19/1973    Quit date: 12/13/1991  . Smokeless tobacco: Never Used  . Alcohol  use 1.8 - 3.0 oz/week    3 - 5 Glasses of wine per week     Comment: takes 3 to 5 glasses of wine nightly   . Drug use: No  . Sexual activity: Not Asked   Other Topics Concern  .  None   Social History Narrative  . None   Depression screen [facility] 2/9 11/17/2016 08/27/2016 06/30/2016 01/03/2016 09/07/2015  Decreased Interest 0 0 0 0 0  Down, Depressed, Hopeless 0 0 0 0 0  PHQ - 2 Score 0 0 0 0 0  Altered sleeping 1 - - - -  Tired, decreased energy 1 - - - -  Change in appetite 1 - - - -  Feeling bad or failure about yourself  0 - - - -  Trouble concentrating 0 - - - -  Moving slowly or fidgety/restless 0 - - - -  Suicidal thoughts 0 - - - -  PHQ-9 Score 3 - - - -    Review of Systems See hpi    Objective:   Physical Exam  Constitutional: He is oriented to person, place, and time. He appears well-developed and well-nourished. No distress.  HENT:  Head: Normocephalic and atraumatic.  Eyes: Conjunctivae are normal. Pupils are equal, round, and reactive to light. No scleral icterus.  Neck: Normal range of motion. Neck supple. No thyromegaly present.  Cardiovascular: Normal rate, regular rhythm, normal heart sounds and intact distal pulses.   Pulmonary/Chest: Effort normal and breath sounds normal. No respiratory distress.  Musculoskeletal: He exhibits no edema.  Lymphadenopathy:    He has no cervical adenopathy.  Neurological: He is alert and oriented to person, place, and time.  Skin: Skin is warm and dry. He is not diaphoretic.  Psychiatric: He has a normal mood and affect. His behavior is normal.      BP 134/87   Pulse 66   Temp 97.9 F (36.6 C) (Oral)   Resp 18   Ht 6' (1.829 m)   Wt 296 lb 6.4 oz (134.4 kg)   SpO2 96%   BMI 40.20 kg/m      Assessment & Plan:  Ask pharmacist  Will need 3-4 wks of all meds.  1. Infective otitis externa of right ear   2. Type 2 diabetes mellitus with hyperglycemia, with long-term current use of insulin (HCC)   3. 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 Sensation of change in body temperature   5. Vivid dream   6. Low hematocrit     Orders Placed This Encounter  Procedures  . WOUND CULTURE    Order Specific Question:   Source    Answer:   right ear  . Fungus culture w smear    Order Specific Question:   Source    Answer:   right ear  . Comprehensive metabolic panel  . Microalbumin/Creatinine Ratio, Urine  . TSH  . Vitamin B12  . CBC with Differential/Platelet  . Folate  . Ferritin  . Care order/instruction:    Scheduling Instructions:     Complete orders, AVS and go.    Meds ordered this encounter  Medications  . furosemide (LASIX) 20 MG tablet    Sig: Take 1 tablet (20 mg total) by mouth daily.    Dispense:  90 tablet    Refill:  1   Over 40 min spent in face-to-face evaluation of and consultation with patient and coordination of care.  Over 50% of this time was spent counseling this patient.  [name], M.D.  Primary Care at [facility] [address] [place], [place] [address] [phone] phone [phone] fax  12/12/16 10:19 PM

## 2016-12-11 LAB — CBC WITH DIFFERENTIAL/PLATELET
Basophils Absolute: 0 10*3/uL (ref 0.0–0.2)
Basos: 0 %
EOS (ABSOLUTE): 0.1 10*3/uL (ref 0.0–0.4)
Eos: 2 %
Hematocrit: 43.8 % (ref 37.5–51.0)
Hemoglobin: 15 g/dL (ref 13.0–17.7)
Immature Grans (Abs): 0 10*3/uL (ref 0.0–0.1)
Immature Granulocytes: 0 %
Lymphocytes Absolute: 1.9 10*3/uL (ref 0.7–3.1)
Lymphs: 32 %
MCH: 34.1 pg — ABNORMAL HIGH (ref 26.6–33.0)
MCHC: 34.2 g/dL (ref 31.5–35.7)
MCV: 100 fL — ABNORMAL HIGH (ref 79–97)
Monocytes Absolute: 0.6 10*3/uL (ref 0.1–0.9)
Monocytes: 10 %
Neutrophils Absolute: 3.4 10*3/uL (ref 1.4–7.0)
Neutrophils: 56 %
Platelets: 184 10*3/uL (ref 150–379)
RBC: 4.4 x10E6/uL (ref 4.14–5.80)
RDW: 13.7 % (ref 12.3–15.4)
WBC: 6 10*3/uL (ref 3.4–10.8)

## 2016-12-11 LAB — COMPREHENSIVE METABOLIC PANEL
ALT: 34 IU/L (ref 0–44)
AST: 46 IU/L — ABNORMAL HIGH (ref 0–40)
Albumin/Globulin Ratio: 1.6 (ref 1.2–2.2)
Albumin: 4.2 g/dL (ref 3.6–4.8)
Alkaline Phosphatase: 43 IU/L (ref 39–117)
BUN/Creatinine Ratio: 9 — ABNORMAL LOW (ref 10–24)
BUN: 11 mg/dL (ref 8–27)
Bilirubin Total: 0.6 mg/dL (ref 0.0–1.2)
CO2: 27 mmol/L (ref 20–29)
Calcium: 8.8 mg/dL (ref 8.6–10.2)
Chloride: 94 mmol/L — ABNORMAL LOW (ref 96–106)
Creatinine, Ser: 1.25 mg/dL (ref 0.76–1.27)
GFR calc Af Amer: 71 mL/min/{1.73_m2} (ref >59)
GFR calc non Af Amer: 62 mL/min/{1.73_m2} (ref >59)
Globulin, Total: 2.7 g/dL (ref 1.5–4.5)
Glucose: 113 mg/dL — ABNORMAL HIGH (ref 65–99)
Potassium: 3.8 mmol/L (ref 3.5–5.2)
Sodium: 139 mmol/L (ref 134–144)
Total Protein: 6.9 g/dL (ref 6.0–8.5)

## 2016-12-11 LAB — FOLATE: Folate: >20 ng/mL (ref >3.0)

## 2016-12-11 LAB — TSH: TSH: 1.7 u[IU]/mL (ref 0.450–4.500)

## 2016-12-11 LAB — MICROALBUMIN / CREATININE URINE RATIO
Creatinine, Urine: 67.8 mg/dL
Microalb/Creat Ratio: 9 mg/g creat (ref 0.0–30.0)
Microalbumin, Urine: 6.1 ug/mL

## 2016-12-11 LAB — VITAMIN B12: Vitamin B-12: 346 pg/mL (ref 232–1245)

## 2016-12-11 LAB — FERRITIN: Ferritin: 391 ng/mL (ref 30–400)

## 2016-12-12 NOTE — Telephone Encounter (Signed)
[name] from [facility] states that he has not received this fax. Please refax to F: [phone]

## 2016-12-12 NOTE — Telephone Encounter (Signed)
Records resent to [facility]

## 2016-12-17 ENCOUNTER — Encounter: Payer: Self-pay | Admitting: Family Medicine

## 2016-12-17 ENCOUNTER — Telehealth: Payer: Self-pay | Admitting: Family Medicine

## 2016-12-17 LAB — WOUND CULTURE

## 2016-12-17 NOTE — Telephone Encounter (Signed)
Dr Brigitte Pulse pt calling wanting you to go over his labs with him or send the information about his labs through [facility] please respond

## 2016-12-18 NOTE — Telephone Encounter (Signed)
My chart message forwarded to Dr. Brigitte Pulse

## 2016-12-19 ENCOUNTER — Encounter: Payer: Self-pay | Admitting: Family Medicine

## 2016-12-20 ENCOUNTER — Telehealth: Payer: Self-pay | Admitting: Gastroenterology

## 2016-12-20 NOTE — Telephone Encounter (Signed)
Spoke to representative from [employer]  Asked her to give Gae Bon from [facility] information regarding the Xifaxan 550 mg.  To clarify, the prescription is, 1 tablet 3 times daily for 14 days.  tAhis information will be forwarded to [place] at [employer]

## 2016-12-21 ENCOUNTER — Encounter: Payer: Self-pay | Admitting: Family Medicine

## 2016-12-21 ENCOUNTER — Telehealth: Payer: Self-pay | Admitting: Family Medicine

## 2016-12-21 ENCOUNTER — Encounter: Payer: Self-pay | Admitting: Gastroenterology

## 2016-12-21 NOTE — Telephone Encounter (Signed)
Pt is seeking an interpretation of his lab work. Please advise.  [phone]

## 2016-12-22 NOTE — Telephone Encounter (Signed)
Dr. Brigitte Pulse, please see this message. If you can release lab results please, to call the patient. Thanks.

## 2016-12-26 ENCOUNTER — Encounter: Payer: Self-pay | Admitting: Family Medicine

## 2016-12-27 NOTE — Telephone Encounter (Signed)
Note sent to MyChart. Will call Dr. Berle Mull office.

## 2016-12-28 NOTE — Progress Notes (Signed)
Labs sent 12/28/16 10:09am

## 2016-12-28 NOTE — Progress Notes (Signed)
Labs sent 12/28/16 10:12am

## 2016-12-29 ENCOUNTER — Other Ambulatory Visit: Payer: Self-pay | Admitting: Family Medicine

## 2017-01-01 ENCOUNTER — Telehealth: Payer: Self-pay | Admitting: Family Medicine

## 2017-01-01 NOTE — Telephone Encounter (Signed)
Pt is leaving for [place] on Wednesday and is needing to get a refill on his potassium and his lisinopril -he had the pharmacy request this on Saturday and he really needs this taken care of today   Best number  [phone]

## 2017-01-01 NOTE — Telephone Encounter (Signed)
Dr Brigitte Pulse please advise or fill if appropriate Thanks

## 2017-01-02 ENCOUNTER — Encounter: Payer: Self-pay | Admitting: Family Medicine

## 2017-01-02 LAB — FUNGUS CULTURE W SMEAR

## 2017-01-02 MED ORDER — LISINOPRIL-HYDROCHLOROTHIAZIDE 20-12.5 MG PO TABS: 2.0000 | ORAL_TABLET | ORAL | 0 refills | Status: DC

## 2017-01-02 MED ORDER — POTASSIUM CHLORIDE CRYS ER 20 MEQ PO TBCR: 20.0000 meq | EXTENDED_RELEASE_TABLET | Freq: Every day | ORAL | 0 refills | Status: DC

## 2017-01-02 MED ORDER — FUROSEMIDE 20 MG PO TABS: 20.0000 mg | ORAL_TABLET | Freq: Every day | ORAL | 1 refills | Status: DC

## 2017-01-02 NOTE — Telephone Encounter (Signed)
Sorry - I have been out of th office since Friday on vacation and so this should have been directed to anyone actually in the office (preferably verbaly as well as by message) that day since it needed to be taken care of same day and he would CLEARLY have absolutely no way to get his necessary bp meds if not addressed within the next several hours.  Unfortunately, I assume he is going to be abroad for sev wks with high bp and muscle cramps. . . .   Please call pt immed w/ our sincerest apologist (hopefully he knows our inefficiencies and gave [place] a falsely early departure date)

## 2017-01-08 NOTE — Telephone Encounter (Signed)
Pt received medications before vacation.

## 2017-01-29 ENCOUNTER — Encounter: Payer: Self-pay | Admitting: Family Medicine

## 2017-02-06 ENCOUNTER — Telehealth: Payer: Self-pay | Admitting: Family Medicine

## 2017-02-06 DIAGNOSIS — G4733 Obstructive sleep apnea (adult) (pediatric): Secondary | ICD-10-CM

## 2017-02-06 NOTE — Telephone Encounter (Signed)
Raven with [facility] callled and she needs records on patient. Pt has been on cpap for 20 years.  Needs notes addended  to reflect that he has been compliant with the cpap usage.  This is how he can get the new one he needs.  Call her at [phone]

## 2017-02-06 NOTE — Telephone Encounter (Addendum)
I noted in the notes on 12/10/16, 08/27/16, and 06/30/16 that he is using his CPAP already. Why is this not sufficient? What exactly do they need?

## 2017-02-14 NOTE — Telephone Encounter (Signed)
Raven from [facility] states that the order need to say that the pt is using this CPAP machine and its beneficial for pt to use Ravens number is [phone]

## 2017-02-20 ENCOUNTER — Telehealth: Payer: Self-pay

## 2017-02-20 NOTE — Telephone Encounter (Signed)
Spoke with Raven at [employer] [facility]. She was able to see the result notes but needs a physical order for the CPAP. Once order is complete it can faxed to [phone]

## 2017-02-22 NOTE — Telephone Encounter (Signed)
Spoke with provider CMA, paperwork awaiting provider signature and will be faxed today./ S.[place],CMA

## 2017-02-22 NOTE — Telephone Encounter (Signed)
Order placed in epic - please fax over - should have printed out. Addended 6/25 note to ensure it met requirements.

## 2017-03-09 NOTE — Telephone Encounter (Signed)
error 

## 2017-03-14 ENCOUNTER — Ambulatory Visit (INDEPENDENT_AMBULATORY_CARE_PROVIDER_SITE_OTHER): Payer: BLUE CROSS/BLUE SHIELD | Admitting: Family Medicine

## 2017-03-14 ENCOUNTER — Encounter: Payer: Self-pay | Admitting: Family Medicine

## 2017-03-14 VITALS — BP 122/72 | HR 68 | Temp 97.6°F | Resp 16 | Ht 72.05 in | Wt 296.0 lb

## 2017-03-14 DIAGNOSIS — G4700 Insomnia, unspecified: Secondary | ICD-10-CM

## 2017-03-14 DIAGNOSIS — G4733 Obstructive sleep apnea (adult) (pediatric): Secondary | ICD-10-CM

## 2017-03-14 DIAGNOSIS — R740 Nonspecific elevation of levels of transaminase and lactic acid dehydrogenase [LDH]: Secondary | ICD-10-CM

## 2017-03-14 DIAGNOSIS — Z23 Encounter for immunization: Secondary | ICD-10-CM | POA: Diagnosis not present

## 2017-03-14 DIAGNOSIS — IMO0002 Reserved for concepts with insufficient information to code with codable children: Secondary | ICD-10-CM

## 2017-03-14 DIAGNOSIS — E1165 Type 2 diabetes mellitus with hyperglycemia: Secondary | ICD-10-CM

## 2017-03-14 DIAGNOSIS — E1129 Type 2 diabetes mellitus with other diabetic kidney complication: Secondary | ICD-10-CM | POA: Diagnosis not present

## 2017-03-14 DIAGNOSIS — R197 Diarrhea, unspecified: Secondary | ICD-10-CM | POA: Diagnosis not present

## 2017-03-14 DIAGNOSIS — E785 Hyperlipidemia, unspecified: Secondary | ICD-10-CM | POA: Diagnosis not present

## 2017-03-14 DIAGNOSIS — I10 Essential (primary) hypertension: Secondary | ICD-10-CM | POA: Diagnosis not present

## 2017-03-14 DIAGNOSIS — R140 Abdominal distension (gaseous): Secondary | ICD-10-CM | POA: Diagnosis not present

## 2017-03-14 DIAGNOSIS — R7401 Elevation of levels of liver transaminase levels: Secondary | ICD-10-CM

## 2017-03-14 LAB — POCT GLYCOSYLATED HEMOGLOBIN (HGB A1C): Hemoglobin A1C: 7

## 2017-03-14 MED ORDER — DOXEPIN HCL 10 MG PO CAPS: 10.0000 mg | ORAL_CAPSULE | Freq: Every day | ORAL | 3 refills | Status: DC

## 2017-03-14 NOTE — Progress Notes (Addendum)
Subjective:    Patient ID: [name], male    DOB: September 10, 1955, 61 y.o.   MRN: [id number] Chief Complaint  Patient presents with  . Hypertension    follow-up  . Hyperlipidemia  . Diabetes    HPI DMII: Lantus 70u.  120-150s cbgs. No lows. Is fasting today.  microalb 6/25 Lab Results  Component Value Date   HGBA1C 7.2 11/17/2016   HGBA1C 8.3 06/30/2016   HGBA1C 7.2 01/03/2016    H/o lap band.  H/O AKD - now resolved: Saw Dr. Joelyn Oms last year.  GFR temporarily dropped >60 once but now returned to >60 on last sev labs (G2) with no microalb/cr ratio <30 (A1) so no CKD at this time.  At last visit changed from lasix 40 qod to 20 qd  HTN: On dilitiazem 240 qd and lasix 20 qd. metoprolol stopped as thought it was contriputting to fatigue.  BP at home was unchanged after this.  He has an upper arm cuff and he is checking it every morning and have been running 140-150s/80s, occ 130s-160/90.  Diarrhea: States he is still having "exploding poop".  He is taking 1 imodium every 3-4d and will cause occ constipation but better than the diarrhea. Probiotic and yogurt every morning still w/ minimal improvement. Did see Dr. Ardis Hughs.  ENT dr gave him cipro for his GI sxs and then Alonza Bogus rx'd him the rifaxim but he couldn't tolerate it and had to stop the course after just sev d due to severe worsening of GI sxs -  after the initial improvement from cipro. Wonders about EPI?? 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  Started the rifaximin for 2-3d after the cipro  Did notice that perhaps the GI sxs started around the same time as the lasix/K change.   Historically was more constipated. Using tums, peptobismol but imodium much more effective.    Though he take a second course of cipro from a friend while they were traveling and pt contracted a URI/sinus infxn.  Reports the cipro worked great for the URI sxs but GI sxs were unchanged by it this second time. Wakes in the middle of the night routinely for sev times and is often awake from 3-6 and would like to try a sleeping pill.   H/o lichen planus over skin - worries it might be flaring again as lots of little spots.  Past Medical History:  Diagnosis Date  . Arthritis   . Diabetes mellitus   . Difficult intubation 04/05/2014  . Dyslipidemia   . Family history of anesthesia complication    pt states took days for his father to awaken after mask was used   . GERD (gastroesophageal reflux disease)    has been having acid reflux since recent endoscopy   . H/O hiatal hernia    repair with lap band, now has ventral hernia midline abd  . Hyperlipidemia   . Hypertension   . Morbid obesity ([place])   . OSA (obstructive sleep apnea)   . Rotator cuff tear   . Seasonal allergies   . Shortness of breath    pt states related to high BP meds with extended walking or climbling stairs  . Sleep apnea    Past Surgical History:  Procedure Laterality Date  . CARDIAC CATHETERIZATION     approx 3 years ago   .  COLONOSCOPY WITH PROPOFOL N/A 03/10/2015   Procedure: COLONOSCOPY WITH PROPOFOL;  Surgeon: Milus Banister, MD;  Location: [facility];  Service: Endoscopy;  Laterality: N/A;  . colonscopy      2012  . ESOPHAGOGASTRODUODENOSCOPY (EGD) WITH PROPOFOL N/A 03/10/2015   Procedure: ESOPHAGOGASTRODUODENOSCOPY (EGD) WITH PROPOFOL;  Surgeon: Milus Banister, MD;  Location: [facility];  Service: Endoscopy;  Laterality: N/A;  . HERNIA REPAIR     with gastric banding  . LAPAROSCOPIC GASTRIC BANDING N/A 04/05/2014   Procedure: LAPAROSCOPIC GASTRIC BANDING;  Surgeon: Pedro Earls, MD;  Location: [facility];  Service: General;  Laterality: N/A;  . MENISCUS REPAIR Left    left knee torn meniscus  . VASECTOMY     Current Outpatient Prescriptions on File Prior to Visit  Medication Sig Dispense Refill  . Ascorbic  Acid (VITAMIN C) 100 MG tablet Take 500 mg by mouth once.     Marland Kitchen aspirin 81 MG tablet Take 81 mg by mouth every morning.     . Cyanocobalamin 2500 MCG CHEW Chew 1 tablet by mouth daily.    . fluticasone (FLONASE) 50 MCG/ACT nasal spray Place 2 sprays into both nostrils daily. 16 g 2  . furosemide (LASIX) 20 MG tablet Take 1 tablet (20 mg total) by mouth daily. 90 tablet 1  . glucose blood (TRUETEST TEST) test strip Use as instructed 100 each 2  . ibuprofen (ADVIL,MOTRIN) 200 MG tablet Take 400-600 mg by mouth once as needed for headache or mild pain.    . Insulin Glargine-Lixisenatide (SOLIQUA) 100-33 UNT-MCG/ML SOPN Inject 0.6 mLs into the skin daily. 18 pen 1  . Insulin Pen Needle (B-D UF III MINI PEN NEEDLES) 31G X 5 MM MISC USE AS DIRECTED ONCE A DAY 100 each 0  . lisinopril-hydrochlorothiazide (PRINZIDE,ZESTORETIC) 20-12.5 MG tablet Take 2 tablets by mouth every morning. 180 tablet 0  . metFORMIN (GLUCOPHAGE) 500 MG tablet Take 1 tablet (500 mg total) by mouth daily with breakfast. 360 tablet 1  . Multiple Vitamin (MULTIVITAMIN) tablet Take 1 tablet by mouth every morning.     . potassium chloride SA (K-DUR,KLOR-CON) 20 MEQ tablet Take 1 tablet (20 mEq total) by mouth daily. 90 tablet 0  . Probiotic Product (PROBIOTIC DAILY PO) Take 2 capsules by mouth daily.      No current facility-administered medications on file prior to visit.    Allergies  Allergen Reactions  . Erythromycin Nausea Only  . Penicillins Nausea Only    Upset stomach   Family History  Problem Relation Age of Onset  . Clotting disorder Mother        lung clot  . Mesothelioma Father   . Hypertension Brother   . Skin cancer Brother   . Colon cancer Neg Hx    Social History   Social History  . Marital status: Married    Spouse name: N/A  . Number of children: 2  . Years of education: N/A   Occupational History  . owner     [place] History Main Topics  . Smoking status: Former Smoker     Packs/day: 4.00    Years: 20.00    Types: Cigarettes    Start date: 07/19/1973    Quit date: 12/13/1991  . Smokeless tobacco: Never Used  . Alcohol use 1.8 - 3.0 oz/week    3 - 5 Glasses of wine per week     Comment: takes 3 to 5 glasses of wine nightly   .  Drug use: No  . Sexual activity: Not Asked   Other Topics Concern  . None   Social History Narrative  . None   Depression screen [facility] 2/9 03/14/2017 11/17/2016 08/27/2016 06/30/2016 01/03/2016  Decreased Interest 0 0 0 0 0  Down, Depressed, Hopeless 0 0 0 0 0  PHQ - 2 Score 0 0 0 0 0  Altered sleeping - 1 - - -  Tired, decreased energy - 1 - - -  Change in appetite - 1 - - -  Feeling bad or failure about yourself  - 0 - - -  Trouble concentrating - 0 - - -  Moving slowly or fidgety/restless - 0 - - -  Suicidal thoughts - 0 - - -  PHQ-9 Score - 3 - - -     Review of Systems  Cardiovascular: Positive for leg swelling.  Skin: Positive for rash. Negative for pallor and wound.  Psychiatric/Behavioral: Positive for sleep disturbance.       Objective:   Physical Exam  Constitutional: He is oriented to person, place, and time. He appears well-developed and well-nourished. No distress.  HENT:  Head: Normocephalic and atraumatic.  Eyes: Pupils are equal, round, and reactive to light. Conjunctivae are normal. No scleral icterus.  Neck: Normal range of motion. Neck supple. No thyromegaly present.  Cardiovascular: Normal rate, regular rhythm, normal heart sounds and intact distal pulses.   Pulmonary/Chest: Effort normal and breath sounds normal. No respiratory distress.  Abdominal: He exhibits distension. He exhibits no pulsatile midline mass and no mass. Bowel sounds are increased. There is no tenderness. There is no rebound.  Unable to assess for organomegaly  Or masses due to body habitus and bloating/distension.  Musculoskeletal: He exhibits no edema.  Lymphadenopathy:    He has no cervical adenopathy.  Neurological: He is alert  and oriented to person, place, and time.  Skin: Skin is warm and dry. He is not diaphoretic.  Psychiatric: He has a normal mood and affect. His behavior is normal.   BP 122/72   Pulse 68   Temp 97.6 F (36.4 C) (Oral)   Resp 16   Ht 6' 0.05" (1.83 m)   Wt 296 lb (134.3 kg)   SpO2 95%   BMI 40.09 kg/m   Results for orders placed or performed in visit on 03/14/17  POCT glycosylated hemoglobin (Hb A1C)  Result Value Ref Range   Hemoglobin A1C 7.0        Assessment & Plan:  Cmp, a1c, lipid Update eye exam   1. Essential hypertension - much better today than normally is at home so pt wondering about cuff accuracy - encouraged him to bring it in for a nurse BP check against his home to see if needs calibrated.  Cont cartia XT (dilt) 240 qd with lasix 20 qd. Cont lisinopril-hctz 20-12.5 2 tabs qd.  2. OSA (obstructive sleep apnea)   3. 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  4. Hyperlipidemia, unspecified hyperlipidemia type - for some reason one of the GI nurses took crestor 20 off his chart - would likely have ran out >2 mos ago (last rx 06/30/16 for 6 mos) - pt confirms that he did stop taking it 4 mos prior in June.  Lipids uncontrolled - restart crestor 20..  5. Need for prophylactic vaccination and inoculation against influenza   6. Diarrhea, unspecified type - encouraged pt to sched another appt with GI - Dr. Ardis Hughs since sxs persist. Never took more than a few d of the rifaximin. Ok to use imodium2x/wk for sx control. Consider empiric course of metronidazole if sxs sig worsen prior to GI visit???  Has already had 2 courses of cipro - thought the first helped great, the second not at all.  May want to try holding metformin  though hard to believe 500 qam only is only cause of sxs and has been on that for a long time. Has not had any abd imaging other than xrays since UGI and [place] 3 yrs ago. Will check abd [place] to eval pancreas and liver. (or could cons CT). Does have a h/o gastric banding 3 yrs prior which may be complicating issues.  Pt wonders if he could have exocrine pancreatic insuffiency so consider imaging to r/o pancreatic cancer, could consider checking fecal fat and elastase.  Lipase nml 4 mos prior. No h/o pancreatitis. 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 Need GI to get jejunal aspirate or a carbohydrate breath test to check for this. However, trx for this is rifaximin 1650 mg/day for 14 days (=550mg  tid) and pt could not tolerate it even for several days - stating it worsened his sxs which is odd as rifaximin is usually very well tolerated. . . .  However, could try augmentin 875 bid x 7-10d.  Or treat with metronidazole 500 tid+ either keflex 500 tid or bactrim ds bid. Did have a negative full stool GI PCR profile for ~20 pathogens inc giardia when pt first presented w/ persistent sxs in the beginning of June (4 mos ago) as was as a neg fecal lactoferrin.  Do we need to check for celiac??  Seems unlikely.  Have not checked H. Pylori. . .   7. Insomnia, unspecified type - Try doxepin for sleep    Orders Placed This Encounter  Procedures  . Flu Vaccine QUAD 36+ mos IM  . Comprehensive metabolic panel    Order Specific Question:   Has the patient fasted?    Answer:   Yes  . Lipid panel    Order Specific Question:   Has the patient fasted?    Answer:   Yes  . POCT glycosylated hemoglobin (Hb A1C)  . POCT urinalysis dipstick    Meds ordered this encounter  Medications  . CARTIA XT 240 MG 24 hr capsule    Sig: Take 240 mg by mouth daily.    Refill:  1  . neomycin-polymyxin-hydrocortisone (CORTISPORIN) 3.5-10000-1 OTIC suspension    Sig: Place 1 drop into the right ear 2  (two) times daily.    Refill:  1  . LANTUS SOLOSTAR 100 UNIT/ML Solostar Pen    Sig: Inject 70 Units into the skin at bedtime.    Refill:  1  . doxepin (SINEQUAN) 10 MG capsule    Sig: Take 1 capsule (10 mg total) by mouth at bedtime.    Dispense:  30 capsule    Refill:  3   Greater than 50% of the 40 minute visit was spent in counseling/coordination of care regarding evaluation and treatment of his chronic diarrhea.  [name], M.D.  Primary [facility] at [facility] [address] [place], [place] [address] [phone] phone [phone] fax  03/16/17 2:01 PM

## 2017-03-14 NOTE — Patient Instructions (Addendum)
Your blood sugar is doing better and really heading in the right direction. I'm proud of him. Keep it up.  Bring your blood pressure cuff by the office and let us check it against our blood pressure cuff to see if it needs to be calibrated. I certainly like our level today much better. Try checking your blood pressure in 2-4 hours after you take your blood pressure medicine rather than before.  If your symptoms continue, we can may want to try an empiric course of the metronidazole antibiotic.  Make an appointment with Dr. Ardis Hughs at his first available in case this doesn't work. However it is fine to continue Imodium twice a week.  Try taking the sleep medicine doxepin 1-2 hours before bed on an empty stomach. The dose on this can go very high, even up to 150 mg so if it's not working, it is fine to increase it.    IF you received an x-ray today, you will receive an invoice from [facility]. Please contact [facility] at [phone] with questions or concerns regarding your invoice.   IF you received labwork today, you will receive an invoice from [place]. Please contact [facility] at [phone] with questions or concerns regarding your invoice.   Our billing staff will not be able to assist you with questions regarding bills from these companies.  You will be contacted with the lab results as soon as they are available. The fastest way to get your results is to activate your My Chart account. Instructions are located on the last page of this paperwork. If you have not heard from [place] regarding the results in 2 weeks, please contact this office.     Food Choices to Help Relieve Diarrhea, Adult When you have diarrhea, the foods you eat and your eating habits are very important. Choosing the right foods and drinks can help:  Relieve diarrhea.  Replace lost fluids and nutrients.  Prevent dehydration.  What general guidelines should I follow? Relieving diarrhea  Choose  foods with less than 2 g or .07 oz. of fiber per serving.  Limit fats to less than 8 tsp (38 g or 1.34 oz.) a day.  Avoid the following: ? Foods and beverages sweetened with high-fructose corn syrup, honey, or sugar alcohols such as xylitol, sorbitol, and mannitol. ? Foods that contain a lot of fat or sugar. ? Fried, greasy, or spicy foods. ? High-fiber grains, breads, and cereals. ? Raw fruits and vegetables.  Eat foods that are rich in probiotics. These foods include dairy products such as yogurt and fermented milk products. They help increase healthy bacteria in the stomach and intestines (gastrointestinal tract, or GI tract).  If you have lactose intolerance, avoid dairy products. These may make your diarrhea worse.  Take medicine to help stop diarrhea (antidiarrheal medicine) only as told by your health care provider. Replacing nutrients  Eat small meals or snacks every 3-4 hours.  Eat bland foods, such as white rice, toast, or baked potato, until your diarrhea starts to get better. Gradually reintroduce nutrient-rich foods as tolerated or as told by your health care provider. This includes: ? Well-cooked protein foods. ? Peeled, seeded, and soft-cooked fruits and vegetables. ? Low-fat dairy products.  Take vitamin and mineral supplements as told by your health care provider. Preventing dehydration   Start by sipping water or a special solution to prevent dehydration (oral rehydration solution, ORS). Urine that is clear or pale yellow means that you are getting enough fluid.  Try  to drink at least 8-10 cups of fluid each day to help replace lost fluids.  You may add other liquids in addition to water, such as clear juice or decaffeinated sports drinks, as tolerated or as told by your health care provider.  Avoid drinks with caffeine, such as coffee, tea, or soft drinks.  Avoid alcohol. What foods are recommended? The items listed may not be a complete list. Talk with your  health care provider about what dietary choices are best for you. Grains White rice. White, [place], or pita breads (fresh or toasted), including plain rolls, buns, or bagels. White pasta. Saltine, soda, or graham crackers. Pretzels. Low-fiber cereal. Cooked cereals made with water (such as cornmeal, farina, or cream cereals). Plain muffins. Matzo. Melba toast. Zwieback. Vegetables Potatoes (without the skin). Most well-cooked and canned vegetables without skins or seeds. Tender lettuce. Fruits Apple sauce. Fruits canned in juice. Cooked apricots, cherries, grapefruit, peaches, pears, or plums. Fresh bananas and cantaloupe. Meats and other protein foods Baked or boiled chicken. Eggs. Tofu. Fish. Seafood. Smooth nut butters. Ground or well-cooked tender beef, ham, veal, lamb, pork, or poultry. Dairy Plain yogurt, kefir, and unsweetened liquid yogurt. Lactose-free milk, buttermilk, skim milk, or soy milk. Low-fat or nonfat hard cheese. Beverages Water. Low-calorie sports drinks. Fruit juices without pulp. Strained tomato and vegetable juices. Decaffeinated teas. Sugar-free beverages not sweetened with sugar alcohols. Oral rehydration solutions, if approved by your health care provider. Seasoning and other foods Bouillon, broth, or soups made from recommended foods. What foods are not recommended? The items listed may not be a complete list. Talk with your health care provider about what dietary choices are best for you. Grains Whole grain, whole wheat, bran, or rye breads, rolls, pastas, and crackers. Wild or brown rice. Whole grain or bran cereals. Barley. Oats and oatmeal. Corn tortillas or taco shells. Granola. Popcorn. Vegetables Raw vegetables. Fried vegetables. Cabbage, broccoli, Brussels sprouts, artichokes, baked beans, beet greens, corn, kale, legumes, peas, sweet potatoes, and yams. Potato skins. Cooked spinach and cabbage. Fruits Dried fruit, including raisins and dates. Raw fruits.  Stewed or dried prunes. Canned fruits with syrup. Meat and other protein foods Fried or fatty meats. Deli meats. Chunky nut butters. Nuts and seeds. Beans and lentils. Berniece Salines. Hot dogs. Sausage. Dairy High-fat cheeses. Whole milk, chocolate milk, and beverages made with milk, such as milk shakes. Half-and-half. Cream. sour cream. Ice cream. Beverages Caffeinated beverages (such as coffee, tea, soda, or energy drinks). Alcoholic beverages. Fruit juices with pulp. Prune juice. Soft drinks sweetened with high-fructose corn syrup or sugar alcohols. High-calorie sports drinks. Fats and oils Butter. Cream sauces. Margarine. Salad oils. Plain salad dressings. Olives. Avocados. Mayonnaise. Sweets and desserts Sweet rolls, doughnuts, and sweet breads. Sugar-free desserts sweetened with sugar alcohols such as xylitol and sorbitol. Seasoning and other foods Honey. Hot sauce. Chili powder. Gravy. Cream-based or milk-based soups. Pancakes and waffles. Summary  When you have diarrhea, the foods you eat and your eating habits are very important.  Make sure you get at least 8-10 cups of fluid each day, or enough to keep your urine clear or pale yellow.  Eat bland foods and gradually reintroduce healthy, nutrient-rich foods as tolerated, or as told by your health care provider.  Avoid high-fiber, fried, greasy, or spicy foods. This information is not intended to replace advice given to you by your health care provider. Make sure you discuss any questions you have with your health care provider. Document Released: 08/25/2003 Document Revised: 06/01/2016 Document  Reviewed: 06/01/2016 Elsevier Interactive Patient Education  2017 [employer].

## 2017-03-15 ENCOUNTER — Other Ambulatory Visit: Payer: Self-pay | Admitting: Family Medicine

## 2017-03-15 LAB — COMPREHENSIVE METABOLIC PANEL
ALT: 36 IU/L (ref 0–44)
AST: 51 IU/L — ABNORMAL HIGH (ref 0–40)
Albumin/Globulin Ratio: 1.9 (ref 1.2–2.2)
Albumin: 4.2 g/dL (ref 3.6–4.8)
Alkaline Phosphatase: 48 IU/L (ref 39–117)
BUN/Creatinine Ratio: 10 (ref 10–24)
BUN: 11 mg/dL (ref 8–27)
Bilirubin Total: 0.7 mg/dL (ref 0.0–1.2)
CO2: 28 mmol/L (ref 20–29)
Calcium: 8.8 mg/dL (ref 8.6–10.2)
Chloride: 92 mmol/L — ABNORMAL LOW (ref 96–106)
Creatinine, Ser: 1.1 mg/dL (ref 0.76–1.27)
GFR calc Af Amer: 83 mL/min/{1.73_m2} (ref >59)
GFR calc non Af Amer: 72 mL/min/{1.73_m2} (ref >59)
Globulin, Total: 2.2 g/dL (ref 1.5–4.5)
Glucose: 106 mg/dL — ABNORMAL HIGH (ref 65–99)
Potassium: 3.4 mmol/L — ABNORMAL LOW (ref 3.5–5.2)
Sodium: 136 mmol/L (ref 134–144)
Total Protein: 6.4 g/dL (ref 6.0–8.5)

## 2017-03-15 LAB — LIPID PANEL
Chol/HDL Ratio: 4.8 ratio (ref 0.0–5.0)
Cholesterol, Total: 218 mg/dL — ABNORMAL HIGH (ref 100–199)
HDL: 45 mg/dL (ref >39)
LDL Calculated: 126 mg/dL — ABNORMAL HIGH (ref 0–99)
Triglycerides: 237 mg/dL — ABNORMAL HIGH (ref 0–149)
VLDL Cholesterol Cal: 47 mg/dL — ABNORMAL HIGH (ref 5–40)

## 2017-03-16 MED ORDER — POTASSIUM CHLORIDE CRYS ER 20 MEQ PO TBCR: 20.0000 meq | EXTENDED_RELEASE_TABLET | Freq: Every day | ORAL | 0 refills | Status: DC

## 2017-03-16 MED ORDER — ROSUVASTATIN CALCIUM 20 MG PO TABS: 20.0000 mg | ORAL_TABLET | Freq: Every day | ORAL | 3 refills | Status: DC

## 2017-03-16 MED ORDER — CARTIA XT 240 MG PO CP24: 240.0000 mg | ORAL_CAPSULE | Freq: Every day | ORAL | 1 refills | Status: DC

## 2017-03-16 MED ORDER — LISINOPRIL-HYDROCHLOROTHIAZIDE 20-12.5 MG PO TABS: 2.0000 | ORAL_TABLET | ORAL | 1 refills | Status: DC

## 2017-03-23 ENCOUNTER — Encounter: Payer: Self-pay | Admitting: Family Medicine

## 2017-03-23 DIAGNOSIS — R3129 Other microscopic hematuria: Secondary | ICD-10-CM

## 2017-03-23 NOTE — Telephone Encounter (Signed)
Adv pt his MyChart message has been recevied and the time frame was 48-72 business hours.

## 2017-04-03 ENCOUNTER — Encounter: Payer: Self-pay | Admitting: Family Medicine

## 2017-04-04 ENCOUNTER — Telehealth: Payer: Self-pay

## 2017-04-04 NOTE — Telephone Encounter (Signed)
Pt wanted latest email printed and given to Dr. Brigitte Pulse,. REsponded it was printed and put in her box for assistant. She is out of town until 10/29.  If he needs assistance prior to that date to email back and we will send to [employer] PA-C who is covering her messages.

## 2017-04-09 NOTE — Telephone Encounter (Signed)
Received and responded to MyChart messages. Thanks!

## 2017-04-09 NOTE — Addendum Note (Signed)
Addended by: Shawnee Knapp on: 04/09/2017 11:39 AM   Modules accepted: Level of Service

## 2017-04-09 NOTE — Addendum Note (Signed)
Addended by: Shawnee Knapp on: 04/09/2017 11:29 AM   Modules accepted: Orders

## 2017-05-01 ENCOUNTER — Ambulatory Visit
Admission: RE | Admit: 2017-05-01 | Discharge: 2017-05-01 | Disposition: A | Discharge status: Home / Self Care | Payer: BLUE CROSS/BLUE SHIELD | Source: Ambulatory Visit | Attending: Family Medicine | Admitting: Family Medicine

## 2017-05-01 DIAGNOSIS — R197 Diarrhea, unspecified: Secondary | ICD-10-CM

## 2017-05-01 DIAGNOSIS — R740 Nonspecific elevation of levels of transaminase and lactic acid dehydrogenase [LDH]: Secondary | ICD-10-CM

## 2017-05-01 DIAGNOSIS — R140 Abdominal distension (gaseous): Secondary | ICD-10-CM

## 2017-05-01 DIAGNOSIS — R7401 Elevation of levels of liver transaminase levels: Secondary | ICD-10-CM

## 2017-05-07 ENCOUNTER — Encounter: Payer: Self-pay | Admitting: Family Medicine

## 2017-05-17 ENCOUNTER — Encounter: Payer: Self-pay | Admitting: Family Medicine

## 2017-05-18 ENCOUNTER — Encounter: Payer: Self-pay | Admitting: Family Medicine

## 2017-05-23 ENCOUNTER — Encounter: Payer: Self-pay | Admitting: Family Medicine

## 2017-05-27 ENCOUNTER — Ambulatory Visit: Payer: BLUE CROSS/BLUE SHIELD | Admitting: Family Medicine

## 2017-06-01 ENCOUNTER — Other Ambulatory Visit: Payer: Self-pay

## 2017-06-01 ENCOUNTER — Ambulatory Visit: Payer: BLUE CROSS/BLUE SHIELD | Admitting: Family Medicine

## 2017-06-01 ENCOUNTER — Encounter: Payer: Self-pay | Admitting: Family Medicine

## 2017-06-01 VITALS — BP 138/84 | HR 71 | Temp 98.5°F | Resp 18 | Ht 72.5 in | Wt 296.4 lb

## 2017-06-01 DIAGNOSIS — E1129 Type 2 diabetes mellitus with other diabetic kidney complication: Secondary | ICD-10-CM

## 2017-06-01 DIAGNOSIS — R140 Abdominal distension (gaseous): Secondary | ICD-10-CM

## 2017-06-01 DIAGNOSIS — K760 Fatty (change of) liver, not elsewhere classified: Secondary | ICD-10-CM | POA: Diagnosis not present

## 2017-06-01 DIAGNOSIS — I10 Essential (primary) hypertension: Secondary | ICD-10-CM | POA: Diagnosis not present

## 2017-06-01 DIAGNOSIS — I714 Abdominal aortic aneurysm, without rupture, unspecified: Secondary | ICD-10-CM

## 2017-06-01 DIAGNOSIS — R740 Nonspecific elevation of levels of transaminase and lactic acid dehydrogenase [LDH]: Secondary | ICD-10-CM

## 2017-06-01 DIAGNOSIS — R7401 Elevation of levels of liver transaminase levels: Secondary | ICD-10-CM

## 2017-06-01 DIAGNOSIS — R3129 Other microscopic hematuria: Secondary | ICD-10-CM

## 2017-06-01 DIAGNOSIS — Z119 Encounter for screening for infectious and parasitic diseases, unspecified: Secondary | ICD-10-CM

## 2017-06-01 DIAGNOSIS — IMO0002 Reserved for concepts with insufficient information to code with codable children: Secondary | ICD-10-CM

## 2017-06-01 DIAGNOSIS — E1165 Type 2 diabetes mellitus with hyperglycemia: Secondary | ICD-10-CM | POA: Diagnosis not present

## 2017-06-01 DIAGNOSIS — R197 Diarrhea, unspecified: Secondary | ICD-10-CM

## 2017-06-01 HISTORY — DX: Abdominal aortic aneurysm, without rupture: I71.4

## 2017-06-01 HISTORY — DX: Fatty (change of) liver, not elsewhere classified: K76.0

## 2017-06-01 HISTORY — DX: Abdominal aortic aneurysm, without rupture, unspecified: I71.40

## 2017-06-01 LAB — POCT URINALYSIS DIP (MANUAL ENTRY)
Bilirubin, UA: NEGATIVE
Blood, UA: NEGATIVE
Glucose, UA: NEGATIVE mg/dL
Ketones, POC UA: NEGATIVE mg/dL
Leukocytes, UA: NEGATIVE
Nitrite, UA: NEGATIVE
Protein Ur, POC: NEGATIVE mg/dL
Spec Grav, UA: 1.01 (ref 1.010–1.025)
Urobilinogen, UA: 0.2 E.U./dL
pH, UA: 7 (ref 5.0–8.0)

## 2017-06-01 LAB — POC MICROSCOPIC URINALYSIS (UMFC): Mucus: ABSENT

## 2017-06-01 LAB — POCT GLYCOSYLATED HEMOGLOBIN (HGB A1C): Hemoglobin A1C: 7.1

## 2017-06-01 MED ORDER — METOPROLOL SUCCINATE ER 100 MG PO TB24: 100.0000 mg | ORAL_TABLET | Freq: Every day | ORAL | 3 refills | Status: DC

## 2017-06-01 MED ORDER — CHOLESTYRAMINE 4 G PO PACK: 4.0000 g | PACK | Freq: Three times a day (TID) | ORAL | 2 refills | Status: DC

## 2017-06-01 MED ORDER — AZITHROMYCIN 250 MG PO TABS
ORAL_TABLET | ORAL | 0 refills | Status: DC
Start: 2017-06-01 — End: 2017-10-04

## 2017-06-01 MED ORDER — RIFAXIMIN 550 MG PO TABS: 550.0000 mg | ORAL_TABLET | Freq: Three times a day (TID) | ORAL | 0 refills | Status: DC

## 2017-06-01 MED ORDER — INSULIN GLARGINE 100 UNIT/ML SOLOSTAR PEN: 80.0000 [IU] | PEN_INJECTOR | Freq: Every day | SUBCUTANEOUS | 0 refills | Status: DC

## 2017-06-01 NOTE — Progress Notes (Addendum)
Subjective:  By signing my name below, I, [name], attest that this documentation has been prepared under the direction and in the presence of [name], MD. Electronically Signed: [name], [place]. 06/01/2017 , 10:53 AM .  Patient was seen in Room 1 .   Patient ID: SEDDRICK FLAX, male    DOB: 25-Jul-1955, 61 y.o.   MRN: [id number] Chief Complaint  Patient presents with  . Diabetes  . Hypertension  . Diarrhea  . Follow-up   HPI LAKE CINQUEMANI is a 61 y.o. male who presents to Primary Care at [facility] for follow up. He's planning to go on a cruise early January for a week.   Hyperlipidemia Last lipid panel very uncontrolled. He is supposed to be on Crestor 20mg  but fallen off medication list. If patient isn't taking it, resuming. If taking, need to be increased to 40mg .   HTN He's on Cartia XT (dilt) 240mg , Lasix 20mg  QD and Lisinopril-HCTZ 40-25mg  QD, and metoprolol.  No improvement in fatigue sxs when we did trial off of metoprolol.  Brought in log of home BP x 2 mos - checks every morning before breakfast around 7-8:30 a.m. timeframe and runs 130s-150s/70-90 -> usually 140s/80s.   He denies any exercise at all - does some walk on vacation (as required for the tourism).   Type 2 DM on Lantus 70 units but increased it to 75u 1 mo prior when he stopped his metformin to see if the latter was having the adverse affect on his bowels (no improvement in GI sxs at all after the cessation of metformin). Since stopped the metformin and increasing lantus 5u, his cbgs have increased precipitously to 160s-190s qam fasting, once was 215, once was 142.  Prior to that his fasting qam cbgs were slightly better at 110-170s but largely 130s-150s. Microalbumin done on 12/10/16.  Optometrist is Christain [place], seen every June.   He's increased drinking his wine from a bottle to 1.5-2 bottles.   He has gained 4-6 lbs in the past month since stopping metformin.  Abdominal aortic  aneurysm Suspect 4cm abdominal aortic aneurysm on Lumbar xray. Patient had ultrasound of aorta done on Jan 2018 that showed maximum diameter 3cm without aneurysmal dilatation. Guideline notes repeat in 3 years.   Diarrhea symptoms  Patient started having diarrhea symptoms that started back in April 2018. Patient states he's currently taking imodium daily. He tried stopping it one day and his "exploding poop" continued. Prior to the diarrhea, he notes constipation as baseline. He noticed eating, and then having cramps before he has a bowel movement. He takes Tums if he has more indigestion, which is about twice a week.   His last colonoscopy was done by Dr. Ardis Hughs in Sept 2016. He did have a 75mm sigmoid colon polyp that was adenomatous, repeat in 5 years. Follow up with GI not yet scheduled.   Past Medical History:  Diagnosis Date  . Arthritis   . Diabetes mellitus   . Difficult intubation 04/05/2014  . Dyslipidemia   . Family history of anesthesia complication    pt states took days for his father to awaken after mask was used   . GERD (gastroesophageal reflux disease)    has been having acid reflux since recent endoscopy   . H/O hiatal hernia    repair with lap band, now has ventral hernia midline abd  . Hyperlipidemia   . Hypertension   . Morbid obesity ([place])   . OSA (obstructive sleep apnea)   .  Rotator cuff tear   . Seasonal allergies   . Shortness of breath    pt states related to high BP meds with extended walking or climbling stairs  . Sleep apnea    Past Surgical History:  Procedure Laterality Date  . CARDIAC CATHETERIZATION     approx 3 years ago   . COLONOSCOPY WITH PROPOFOL N/A 03/10/2015   Procedure: COLONOSCOPY WITH PROPOFOL;  Surgeon: Milus Banister, MD;  Location: [facility];  Service: Endoscopy;  Laterality: N/A;  . colonscopy      2012  . ESOPHAGOGASTRODUODENOSCOPY (EGD) WITH PROPOFOL N/A 03/10/2015   Procedure: ESOPHAGOGASTRODUODENOSCOPY (EGD) WITH PROPOFOL;   Surgeon: Milus Banister, MD;  Location: [facility];  Service: Endoscopy;  Laterality: N/A;  . HERNIA REPAIR     with gastric banding  . LAPAROSCOPIC GASTRIC BANDING N/A 04/05/2014   Procedure: LAPAROSCOPIC GASTRIC BANDING;  Surgeon: Pedro Earls, MD;  Location: [facility] ORS;  Service: General;  Laterality: N/A;  . MENISCUS REPAIR Left    left knee torn meniscus  . VASECTOMY     Prior to Admission medications   Medication Sig Start Date End Date Taking? Authorizing Provider  Ascorbic Acid (VITAMIN C) 100 MG tablet Take 500 mg by mouth once.    Yes [provider]  aspirin 81 MG tablet Take 81 mg by mouth every morning.    Yes [provider]  CARTIA XT 240 MG 24 hr capsule Take 1 capsule (240 mg total) by mouth daily. 03/16/17  Yes Shawnee Knapp, MD  Cyanocobalamin 2500 MCG CHEW Chew 1 tablet by mouth daily.   Yes [provider]  doxepin (SINEQUAN) 10 MG capsule Take 1 capsule (10 mg total) by mouth at bedtime. 03/14/17  Yes Shawnee Knapp, MD  fluticasone (FLONASE) 50 MCG/ACT nasal spray Place 2 sprays into both nostrils daily. 11/17/16  Yes Shawnee Knapp, MD  furosemide (LASIX) 20 MG tablet Take 1 tablet (20 mg total) by mouth daily. 01/02/17  Yes Shawnee Knapp, MD  glucose blood (TRUETEST TEST) test strip Use as instructed 03/05/15  Yes Leandrew Koyanagi, MD  ibuprofen (ADVIL,MOTRIN) 200 MG tablet Take 400-600 mg by mouth once as needed for headache or mild pain.   Yes [provider]  Insulin Pen Needle (B-D UF III MINI PEN NEEDLES) 31G X 5 MM MISC USE AS DIRECTED ONCE A DAY 09/22/16  Yes Shawnee Knapp, MD  LANTUS SOLOSTAR 100 UNIT/ML Solostar Pen INJECT 70 UNITS INTO THE SKIN DAILY AT 10 PM. 03/19/17  Yes Shawnee Knapp, MD  lisinopril-hydrochlorothiazide [facility]) 20-12.5 MG tablet Take 2 tablets by mouth every morning. 03/16/17  Yes Shawnee Knapp, MD  METOPROLOL SUCCINATE PO Take by mouth.   Yes [provider]  Multiple Vitamin (MULTIVITAMIN) tablet  Take 1 tablet by mouth every morning.    Yes [provider]  neomycin-polymyxin-hydrocortisone (CORTISPORIN) 3.5-10000-1 OTIC suspension Place 1 drop into the right ear 2 (two) times daily. 01/01/17  Yes [provider]  potassium chloride SA (K-DUR,KLOR-CON) 20 MEQ tablet Take 1 tablet (20 mEq total) by mouth daily. 03/16/17  Yes Shawnee Knapp, MD  Probiotic Product (PROBIOTIC DAILY PO) Take 2 capsules by mouth daily.    Yes [provider]  rifaximin (XIFAXAN) 550 MG TABS tablet Take 1 tablet (550 mg total) by mouth 3 (three) times daily. 06/01/17  Yes Shawnee Knapp, MD  rosuvastatin (CRESTOR) 20 MG tablet Take 1 tablet (20 mg total) by  mouth daily. 03/16/17  Yes Shawnee Knapp, MD  metFORMIN (GLUCOPHAGE) 500 MG tablet Take 1 tablet (500 mg total) by mouth daily with breakfast. Patient not taking: Reported on 06/01/2017 08/27/16   Shawnee Knapp, MD   Allergies  Allergen Reactions  . Erythromycin Nausea Only  . Penicillins Nausea Only    Upset stomach   Family History  Problem Relation Age of Onset  . Clotting disorder Mother        lung clot  . Mesothelioma Father   . Hypertension Brother   . Skin cancer Brother   . Colon cancer Neg Hx    Social History   Socioeconomic History  . Marital status: Married    Spouse name: None  . Number of children: 2  . Years of education: None  . Highest education level: None  Social Needs  . Financial resource strain: None  . Food insecurity - worry: None  . Food insecurity - inability: None  . Transportation needs - medical: None  . Transportation needs - non-medical: None  Occupational History  . Occupation: owner    Comment: Associate Professor  Tobacco Use  . Smoking status: Former Smoker    Packs/day: 4.00    Years: 20.00    Pack years: 80.00    Types: Cigarettes    Start date: 07/19/1973    Last attempt to quit: 12/13/1991    Years since quitting: 25.4  . Smokeless tobacco: Never Used  Substance and Sexual Activity   . Alcohol use: Yes    Alcohol/week: 1.8 - 3.0 oz    Types: 3 - 5 Glasses of wine per week    Comment: takes 3 to 5 glasses of wine nightly   . Drug use: No  . Sexual activity: None  Other Topics Concern  . None  Social History Narrative  . None   Depression screen [facility] 2/9 03/14/2017 11/17/2016 08/27/2016 06/30/2016 01/03/2016  Decreased Interest 0 0 0 0 0  Down, Depressed, Hopeless 0 0 0 0 0  PHQ - 2 Score 0 0 0 0 0  Altered sleeping - 1 - - -  Tired, decreased energy - 1 - - -  Change in appetite - 1 - - -  Feeling bad or failure about yourself  - 0 - - -  Trouble concentrating - 0 - - -  Moving slowly or fidgety/restless - 0 - - -  Suicidal thoughts - 0 - - -  PHQ-9 Score - 3 - - -    Review of Systems  Constitutional: Negative for fatigue and unexpected weight change.  Eyes: Negative for visual disturbance.  Respiratory: Negative for cough, chest tightness and shortness of breath.   Cardiovascular: Negative for chest pain, palpitations and leg swelling.  Gastrointestinal: Positive for constipation and diarrhea. Negative for abdominal pain and blood in stool.  Neurological: Negative for dizziness, light-headedness and headaches.       Objective:   Physical Exam  Constitutional: He is oriented to person, place, and time. He appears well-developed and well-nourished. No distress.  HENT:  Head: Normocephalic and atraumatic.  Right TM with 3 small black punctuated deposits on erythematous, opaque TM  Eyes: EOM are normal. Pupils are equal, round, and reactive to light.  Neck: Neck supple.  Cardiovascular: Normal rate.  Pulmonary/Chest: Effort normal. No respiratory distress. He has rales (inspiratory, bibasilar) in the right lower field and the left lower field.  Abdominal:  Tympanitic bowel sounds heard in chest  Musculoskeletal: Normal range  of motion.  Neurological: He is alert and oriented to person, place, and time.  Skin: Skin is warm and dry.  Psychiatric: He has a  normal mood and affect. His behavior is normal.  Nursing note and vitals reviewed.   BP 138/84 (BP Location: Left Arm, Patient Position: Sitting, Cuff Size: Large)   Pulse 71   Temp 98.5 F (36.9 C) (Oral)   Resp 18   Ht 6' 0.5" (1.842 m)   Wt 296 lb 6.4 oz (134.4 kg)   SpO2 97%   BMI 39.65 kg/m   Results for orders placed or performed in visit on 06/01/17  POCT glycosylated hemoglobin (Hb A1C)  Result Value Ref Range   Hemoglobin A1C 7.1   POCT Microscopic Urinalysis (UMFC)  Result Value Ref Range   WBC,UR,HPF,POC None None WBC/hpf   RBC,UR,HPF,POC None None RBC/hpf   Bacteria Few (A) None, Too numerous to count   Mucus Absent Absent   Epithelial Cells, UR Per Microscopy None None, Too numerous to count cells/hpf  POCT urinalysis dipstick  Result Value Ref Range   Color, UA yellow yellow   Clarity, UA clear clear   Glucose, UA negative negative mg/dL   Bilirubin, UA negative negative   Ketones, POC UA negative negative mg/dL   Spec Grav, UA 1.010 1.010 - 1.025   Blood, UA negative negative   pH, UA 7.0 5.0 - 8.0   Protein Ur, POC negative negative mg/dL   Urobilinogen, UA 0.2 0.2 or 1.0 E.U./dL   Nitrite, UA Negative Negative   Leukocytes, UA Negative Negative       Assessment & Plan:   1. DM type 2, uncontrolled, with renal complications (HCC) -  Restart metformin since diarrhea/bloating/gas did not improve at all during trial off.  Start splitting lantus insulin up into 2 sites/injections - increase from 75u to 80u (40 & 40) - may not have been absorbing any of the dose increase explaining the worsening cbgs. Only 1500u insulin/box - not getting enough to last him the month.  2. Essential hypertension -on diltiazem SR 240 qd, lasix 20 qd/K 20 qd, lisinopril-hctz 40-25, and toprol XL 100 qd.  Uncontrolled. Sees nephrology 1-2x/hr.  Consider increasing diltiazem, changing metoprolol to BB w/ more BP lowing effect (carvedilol), and/or changing lisinopril to more  effective arb -> olmesartan - discuss at f/u.  3. Diarrhea, unspecified type - no improvement with metformin cessation so ok to restart.  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  Did 3-7d of rifaximin July from GI PA-Cbefore stopping as felt sxs were worsening but he is now willing to retry.  He has only seen GI once in mid June with PA for acute visit but hasn't been able to get in to see his GI dr - Dr. Ardis Hughs at all - hasn't even been able to sched an appt. Would like to transfer his care to [facility] - referral placed. Handed in fecal sample for fecal elastase today to r/o EPI (pt concern after googling) and will check a few other labs for very unlikely dx but still need to be r/o.  Due to age and change in bowels that has persisted, he really does warrant a repeat colonoscopy I think and pt agrees - will refer to [facility] per his request for this  4. Transaminitis -mild considering fatty liver and daily EtOH use of >4x recommended amount  5. Abdominal bloating   6. Other microscopic hematuria   7. Screening examination for infectious disease   8. Fatty liver   9. Abdominal aortic aneurysm (AAA), 30-34 mm diameter (HCC)     pt adament that increasing his nightly wine intake from 1 to 1.5 bottles to 1.5 to 2 bottles has had no perceivable adverse effect but simultaneously brings in documents showing 5 lbs weight gain and fasting a.m. cbg increase by 30 and even slight BP increase but attributes that all do stress of ongoing diarrhea and stopping metformin 500mg  qam  Orders Placed This Encounter  Procedures  . Comprehensive metabolic panel  . CBC with Differential/Platelet  . HIV antibody  . Ambulatory referral to Gastroenterology    Referral Priority:   Routine    Referral Type:   Consultation    Referral  Reason:   Specialty Services Required    Number of Visits Requested:   1  . POCT glycosylated hemoglobin (Hb A1C)    Meds ordered this encounter  Medications  . rifaximin (XIFAXAN) 550 MG TABS tablet    Sig: Take 1 tablet (550 mg total) by mouth 3 (three) times daily.    Dispense:  42 tablet    Refill:  0  . cholestyramine (QUESTRAN) 4 g packet    Sig: Take 1 packet (4 g total) by mouth 3 (three) times daily with meals.    Dispense:  90 each    Refill:  2  . Insulin Glargine (LANTUS SOLOSTAR) 100 UNIT/ML Solostar Pen    Sig: Inject 80 Units into the skin daily at 10 pm. Split into 2 doses administered at 2 different sites    Dispense:  10 pen    Refill:  0  . metoprolol succinate (TOPROL-XL) 100 MG 24 hr tablet    Sig: Take 1 tablet (100 mg total) by mouth daily. Take with or immediately following a meal.    Dispense:  90 tablet    Refill:  3  . azithromycin (ZITHROMAX) 250 MG tablet    Sig: Take 2 tabs PO x 1 dose, then 1 tab PO QD x 4 days    Dispense:  6 tablet    Refill:  0    I personally performed the services described in this documentation, which was scribed in my presence. The recorded information has been reviewed and considered, and addended by me as needed.   [name], M.D.  Primary Care at [facility] [address] [place], [place] [address] [phone] phone [phone] fax  06/04/17 7:42 AM

## 2017-06-01 NOTE — Patient Instructions (Addendum)
IF you received an x-ray today, you will receive an invoice from [facility]. Please contact [facility] at [phone] with questions or concerns regarding your invoice.   IF you received labwork today, you will receive an invoice from [facility]. Please contact [facility] at [phone] with questions or concerns regarding your invoice.   Our billing staff will not be able to assist you with questions regarding bills from these companies.  You will be contacted with the lab results as soon as they are available. The fastest way to get your results is to activate your My Chart account. Instructions are located on the last page of this paperwork. If you have not heard from [place] regarding the results in 2 weeks, please contact this office.    Irritable Bowel Syndrome, Adult Irritable bowel syndrome (IBS) is not one specific disease. It is a group of symptoms that affects the organs responsible for digestion (gastrointestinal or GI tract). To regulate how your GI tract works, your body sends signals back and forth between your intestines and your brain. If you have IBS, there may be a problem with these signals. As a result, your GI tract does not function normally. Your intestines may become more sensitive and overreact to certain things. This is especially true when you eat certain foods or when you are under stress. There are four types of IBS. These may be determined based on the consistency of your stool:  IBS with diarrhea.  IBS with constipation.  Mixed IBS.  Unsubtyped IBS.  It is important to know which type of IBS you have. Some treatments are more likely to be helpful for certain types of IBS. What are the causes? The exact cause of IBS is not known. What increases the risk? You may have a higher risk of IBS if:  You are a woman.  You are younger than 62 years old.  You have a family history of IBS.  You have mental health problems.  You have had  bacterial infection of your GI tract.  What are the signs or symptoms? Symptoms of IBS vary from person to person. The main symptom is abdominal pain or discomfort. Additional symptoms usually include one or more of the following:  Diarrhea, constipation, or both.  Abdominal swelling or bloating.  Feeling full or sick after eating a small or regular-size meal.  Frequent gas.  Mucus in the stool.  A feeling of having more stool left after a bowel movement.  Symptoms tend to come and go. They may be associated with stress, psychiatric conditions, or nothing at all. How is this diagnosed? There is no specific test to diagnose IBS. Your health care provider will make a diagnosis based on a physical exam, medical history, and your symptoms. You may have other tests to rule out other conditions that may be causing your symptoms. These may include:  Blood tests.  X-rays.  CT scan.  Endoscopy and colonoscopy. This is a test in which your GI tract is viewed with a long, thin, flexible tube.  How is this treated? There is no cure for IBS, but treatment can help relieve symptoms. IBS treatment often includes:  Changes to your diet, such as: ? Eating more fiber. ? Avoiding foods that cause symptoms. ? Drinking more water. ? Eating regular, medium-sized portioned meals.  Medicines. These may include: ? Fiber supplements if you have constipation. ? Medicine to control diarrhea (antidiarrheal medicines). ? Medicine to help control muscle spasms in your GI tract (antispasmodic  medicines). ? Medicines to help with any mental health issues, such as antidepressants or tranquilizers.  Therapy. ? Talk therapy may help with anxiety, depression, or other mental health issues that can make IBS symptoms worse.  Stress reduction. ? Managing your stress can help keep symptoms under control.  Follow these instructions at home:  Take medicines only as directed by your health care  provider.  Eat a healthy diet. ? Avoid foods and drinks with added sugar. ? Include more whole grains, fruits, and vegetables gradually into your diet. This may be especially helpful if you have IBS with constipation. ? Avoid any foods and drinks that make your symptoms worse. These may include dairy products and caffeinated or carbonated drinks. ? Do not eat large meals. ? Drink enough fluid to keep your urine clear or pale yellow.  Exercise regularly. Ask your health care provider for recommendations of good activities for you.  Keep all follow-up visits as directed by your health care provider. This is important. Contact a health care provider if:  You have constant pain.  You have trouble or pain with swallowing.  You have worsening diarrhea. Get help right away if:  You have severe and worsening abdominal pain.  You have diarrhea and: ? You have a rash, stiff neck, or severe headache. ? You are irritable, sleepy, or difficult to awaken. ? You are weak, dizzy, or extremely thirsty.  You have bright red blood in your stool or you have black tarry stools.  You have unusual abdominal swelling that is painful.  You vomit continuously.  You vomit blood (hematemesis).  You have both abdominal pain and a fever. This information is not intended to replace advice given to you by your health care provider. Make sure you discuss any questions you have with your health care provider. Document Released: 06/04/2005 Document Revised: 11/04/2015 Document Reviewed: 02/19/2014 Elsevier Interactive Patient Education  2018 [employer]. Diet for Irritable Bowel Syndrome When you have irritable bowel syndrome (IBS), the foods you eat and your eating habits are very important. IBS may cause various symptoms, such as abdominal pain, constipation, or diarrhea. Choosing the right foods can help ease discomfort caused by these symptoms. Work with your health care provider and dietitian to find  the best eating plan to help control your symptoms. What general guidelines do I need to follow?  Keep a food diary. This will help you identify foods that cause symptoms. Write down: ? What you eat and when. ? What symptoms you have. ? When symptoms occur in relation to your meals.  Avoid foods that cause symptoms. Talk with your dietitian about other ways to get the same nutrients that are in these foods.  Eat more foods that contain fiber. Take a fiber supplement if directed by your dietitian.  Eat your meals slowly, in a relaxed setting.  Aim to eat 5-6 small meals per day. Do not skip meals.  Drink enough fluids to keep your urine clear or pale yellow.  Ask your health care provider if you should take an over-the-counter probiotic during flare-ups to help restore healthy gut bacteria.  If you have cramping or diarrhea, try making your meals low in fat and high in carbohydrates. Examples of carbohydrates are pasta, rice, whole grain breads and cereals, fruits, and vegetables.  If dairy products cause your symptoms to flare up, try eating less of them. You might be able to handle yogurt better than other dairy products because it contains bacteria that help with  digestion. What foods are not recommended? The following are some foods and drinks that may worsen your symptoms:  Fatty foods, such as [place] fries.  Milk products, such as cheese or ice cream.  Chocolate.  Alcohol.  Products with caffeine, such as coffee.  Carbonated drinks, such as soda.  The items listed above may not be a complete list of foods and beverages to avoid. Contact your dietitian for more information. What foods are good sources of fiber? Your health care provider or dietitian may recommend that you eat more foods that contain fiber. Fiber can help reduce constipation and other IBS symptoms. Add foods with fiber to your diet a little at a time so that your body can get used to them. Too much fiber at  once might cause gas and swelling of your abdomen. The following are some foods that are good sources of fiber:  Apples.  Peaches.  Pears.  Berries.  Figs.  Broccoli (raw).  Cabbage.  Carrots.  Raw peas.  Kidney beans.  Lima beans.  Whole grain bread.  Whole grain cereal.  Where to find more information: [employer] for Functional Gastrointestinal Disorders: www.iffgd.[employer] of Diabetes and Digestive and Kidney Diseases: NetworkAffair.co.za.aspx This information is not intended to replace advice given to you by your health care provider. Make sure you discuss any questions you have with your health care provider. Document Released: 08/25/2003 Document Revised: 11/10/2015 Document Reviewed: 09/04/2013 Elsevier Interactive Patient Education  2018 [employer].

## 2017-06-02 LAB — COMPREHENSIVE METABOLIC PANEL
ALT: 59 IU/L — ABNORMAL HIGH (ref 0–44)
AST: 72 IU/L — ABNORMAL HIGH (ref 0–40)
Albumin/Globulin Ratio: 1.8 (ref 1.2–2.2)
Albumin: 4.3 g/dL (ref 3.6–4.8)
Alkaline Phosphatase: 55 IU/L (ref 39–117)
BUN/Creatinine Ratio: 10 (ref 10–24)
BUN: 12 mg/dL (ref 8–27)
Bilirubin Total: 0.7 mg/dL (ref 0.0–1.2)
CO2: 27 mmol/L (ref 20–29)
Calcium: 9.2 mg/dL (ref 8.6–10.2)
Chloride: 93 mmol/L — ABNORMAL LOW (ref 96–106)
Creatinine, Ser: 1.22 mg/dL (ref 0.76–1.27)
GFR calc Af Amer: 73 mL/min/{1.73_m2} (ref >59)
GFR calc non Af Amer: 63 mL/min/{1.73_m2} (ref >59)
Globulin, Total: 2.4 g/dL (ref 1.5–4.5)
Glucose: 116 mg/dL — ABNORMAL HIGH (ref 65–99)
Potassium: 3.7 mmol/L (ref 3.5–5.2)
Sodium: 140 mmol/L (ref 134–144)
Total Protein: 6.7 g/dL (ref 6.0–8.5)

## 2017-06-02 LAB — CBC WITH DIFFERENTIAL/PLATELET
Basophils Absolute: 0 10*3/uL (ref 0.0–0.2)
Basos: 0 %
EOS (ABSOLUTE): 0.2 10*3/uL (ref 0.0–0.4)
Eos: 4 %
Hematocrit: 44.5 % (ref 37.5–51.0)
Hemoglobin: 15 g/dL (ref 13.0–17.7)
Immature Grans (Abs): 0 10*3/uL (ref 0.0–0.1)
Immature Granulocytes: 0 %
Lymphocytes Absolute: 1.9 10*3/uL (ref 0.7–3.1)
Lymphs: 35 %
MCH: 33.7 pg — ABNORMAL HIGH (ref 26.6–33.0)
MCHC: 33.7 g/dL (ref 31.5–35.7)
MCV: 100 fL — ABNORMAL HIGH (ref 79–97)
Monocytes Absolute: 0.5 10*3/uL (ref 0.1–0.9)
Monocytes: 9 %
Neutrophils Absolute: 2.8 10*3/uL (ref 1.4–7.0)
Neutrophils: 52 %
Platelets: 197 10*3/uL (ref 150–379)
RBC: 4.45 x10E6/uL (ref 4.14–5.80)
RDW: 13.3 % (ref 12.3–15.4)
WBC: 5.4 10*3/uL (ref 3.4–10.8)

## 2017-06-02 LAB — H. PYLORI BREATH TEST: H pylori Breath Test: NEGATIVE

## 2017-06-02 LAB — HIV ANTIBODY (ROUTINE TESTING W REFLEX): HIV Screen 4th Generation wRfx: NONREACTIVE

## 2017-06-03 ENCOUNTER — Telehealth: Payer: Self-pay

## 2017-06-03 LAB — CELIAC DISEASE AB SCREEN W/RFX
Antigliadin Abs, IgA: 4 units (ref 0–19)
IgA/Immunoglobulin A, Serum: 172 mg/dL (ref 61–437)
Transglutaminase IgA: <2 U/mL (ref 0–3)

## 2017-06-03 NOTE — Telephone Encounter (Signed)
Received approval for patient's Xifaxan.  Pharmacy notified.  They will notify patient.

## 2017-06-05 LAB — PANCREATIC ELASTASE, FECAL: Pancreatic Elastase, Fecal: >500 ug Elast./g (ref >200)

## 2017-06-06 ENCOUNTER — Encounter: Payer: Self-pay | Admitting: Family Medicine

## 2017-06-14 ENCOUNTER — Telehealth: Payer: Self-pay

## 2017-06-14 ENCOUNTER — Encounter: Payer: Self-pay | Admitting: Family Medicine

## 2017-06-14 NOTE — Telephone Encounter (Signed)
Copied from [place] [id number]. Topic: Referral - Request >> Jun 13, 2017  4:42 PM Ivar Drape wrote: Reason for CRM: Patient needs a referral for the [facility]. Patient would also like for the doctor to read the messages he sent to her on MyChart.

## 2017-06-16 NOTE — Telephone Encounter (Signed)
Referral was placed 12/15 and sent 12/19.

## 2017-07-17 ENCOUNTER — Other Ambulatory Visit: Payer: Self-pay | Admitting: *Deleted

## 2017-07-17 ENCOUNTER — Other Ambulatory Visit: Payer: Self-pay | Admitting: Family Medicine

## 2017-07-17 NOTE — Telephone Encounter (Signed)
Requesting refill of K-Dur  LOV 03/14/17 with Dr. Brigitte Pulse  [facility] 03/16/17  #90  0 refills  [facility] # [place], [place]

## 2017-07-18 NOTE — Telephone Encounter (Signed)
lov -06/01/17 Pt is supposed to follow up around 08/30/17 but No appt scheduled at this time. Pls advise

## 2017-07-20 ENCOUNTER — Other Ambulatory Visit: Payer: Self-pay | Admitting: Family Medicine

## 2017-07-22 NOTE — Telephone Encounter (Signed)
LR: 06/01/17 10 pens LOV: 06/01/17  06/01/17 Hgb A1C filled for 2 months

## 2017-08-06 LAB — HM DIABETES EYE EXAM

## 2017-09-10 ENCOUNTER — Other Ambulatory Visit: Payer: Self-pay | Admitting: Family Medicine

## 2017-09-10 ENCOUNTER — Encounter: Payer: Self-pay | Admitting: Family Medicine

## 2017-10-01 ENCOUNTER — Encounter: Payer: Self-pay | Admitting: Physician Assistant

## 2017-10-01 ENCOUNTER — Ambulatory Visit: Payer: BLUE CROSS/BLUE SHIELD | Admitting: Family Medicine

## 2017-10-01 ENCOUNTER — Encounter: Payer: Self-pay | Admitting: Family Medicine

## 2017-10-01 ENCOUNTER — Ambulatory Visit: Payer: Self-pay | Admitting: *Deleted

## 2017-10-01 VITALS — BP 135/81 | HR 74 | Temp 98.9°F | Resp 17 | Ht 72.0 in | Wt 298.0 lb

## 2017-10-01 DIAGNOSIS — R3129 Other microscopic hematuria: Secondary | ICD-10-CM

## 2017-10-01 DIAGNOSIS — R197 Diarrhea, unspecified: Secondary | ICD-10-CM | POA: Diagnosis not present

## 2017-10-01 DIAGNOSIS — E1129 Type 2 diabetes mellitus with other diabetic kidney complication: Secondary | ICD-10-CM | POA: Diagnosis not present

## 2017-10-01 DIAGNOSIS — E1165 Type 2 diabetes mellitus with hyperglycemia: Secondary | ICD-10-CM | POA: Diagnosis not present

## 2017-10-01 DIAGNOSIS — Z6841 Body Mass Index (BMI) 40.0 and over, adult: Secondary | ICD-10-CM

## 2017-10-01 DIAGNOSIS — I10 Essential (primary) hypertension: Secondary | ICD-10-CM | POA: Diagnosis not present

## 2017-10-01 DIAGNOSIS — E785 Hyperlipidemia, unspecified: Secondary | ICD-10-CM

## 2017-10-01 DIAGNOSIS — IMO0002 Reserved for concepts with insufficient information to code with codable children: Secondary | ICD-10-CM

## 2017-10-01 DIAGNOSIS — R310 Gross hematuria: Secondary | ICD-10-CM | POA: Diagnosis not present

## 2017-10-01 LAB — POCT CBC
Granulocyte percent: 79.1 %G (ref 37–80)
HCT, POC: 47.5 % (ref 43.5–53.7)
Hemoglobin: 15.9 g/dL (ref 14.1–18.1)
Lymph, poc: 0.9 (ref 0.6–3.4)
MCH, POC: 32.4 pg — AB (ref 27–31.2)
MCHC: 33.5 g/dL (ref 31.8–35.4)
MCV: 96.8 fL (ref 80–97)
MID (cbc): 1.2 — AB (ref 0–0.9)
MPV: 6.2 fL (ref 0–99.8)
POC Granulocyte: 7.8 — AB (ref 2–6.9)
POC LYMPH PERCENT: 8.9 %L — AB (ref 10–50)
POC MID %: 12 %M (ref 0–12)
Platelet Count, POC: 226 10*3/uL (ref 142–424)
RBC: 4.9 M/uL (ref 4.69–6.13)
RDW, POC: 12.6 %
WBC: 9.8 10*3/uL (ref 4.6–10.2)

## 2017-10-01 LAB — POCT URINALYSIS DIP (MANUAL ENTRY)
Bilirubin, UA: NEGATIVE
Glucose, UA: 100 mg/dL — AB
Ketones, POC UA: NEGATIVE mg/dL
Nitrite, UA: NEGATIVE
Spec Grav, UA: 1.02 (ref 1.010–1.025)
Urobilinogen, UA: 0.2 E.U./dL
pH, UA: 6.5 (ref 5.0–8.0)

## 2017-10-01 LAB — POC MICROSCOPIC URINALYSIS (UMFC): Mucus: ABSENT

## 2017-10-01 MED ORDER — SULFAMETHOXAZOLE-TRIMETHOPRIM 800-160 MG PO TABS: 1.0000 | ORAL_TABLET | Freq: Two times a day (BID) | ORAL | 0 refills | Status: DC

## 2017-10-01 MED ORDER — INSULIN DEGLUDEC 200 UNIT/ML ~~LOC~~ SOPN: 80.0000 [IU] | PEN_INJECTOR | Freq: Every day | SUBCUTANEOUS | 0 refills | Status: DC

## 2017-10-01 NOTE — Patient Instructions (Addendum)
     IF you received an x-ray today, you will receive an invoice from [facility]. Please contact [facility] at [phone] with questions or concerns regarding your invoice.   IF you received labwork today, you will receive an invoice from [place]. Please contact [facility] at [phone] with questions or concerns regarding your invoice.   Our billing staff will not be able to assist you with questions regarding bills from these companies.  You will be contacted with the lab results as soon as they are available. The fastest way to get your results is to activate your My Chart account. Instructions are located on the last page of this paperwork. If you have not heard from [place] regarding the results in 2 weeks, please contact this office.      Hematuria, Adult Hematuria is blood in your urine. It can be caused by a bladder infection, kidney infection, prostate infection, kidney stone, or cancer of your urinary tract. Infections can usually be treated with medicine, and a kidney stone usually will pass through your urine. If neither of these is the cause of your hematuria, further workup to find out the reason may be needed. It is very important that you tell your health care provider about any blood you see in your urine, even if the blood stops without treatment or happens without causing pain. Blood in your urine that happens and then stops and then happens again can be a symptom of a very serious condition. Also, pain is not a symptom in the initial stages of many urinary cancers. Follow these instructions at home:  Drink lots of fluid, 3-4 quarts a day. If you have been diagnosed with an infection, cranberry juice is especially recommended, in addition to large amounts of water.  Avoid caffeine, tea, and carbonated beverages because they tend to irritate the bladder.  Avoid alcohol because it may irritate the prostate.  Take all medicines as directed by your health  care provider.  If you were prescribed an antibiotic medicine, finish it all even if you start to feel better.  If you have been diagnosed with a kidney stone, follow your health care provider's instructions regarding straining your urine to catch the stone.  Empty your bladder often. Avoid holding urine for long periods of time.  After a bowel movement, women should cleanse front to back. Use each tissue only once.  Empty your bladder before and after sexual intercourse if you are a male. Contact a health care provider if:  You develop back pain.  You have a fever.  You have a feeling of sickness in your stomach (nausea) or vomiting.  Your symptoms are not better in 3 days. Return sooner if you are getting worse. Get help right away if:  You develop severe vomiting and are unable to keep the medicine down.  You develop severe back or abdominal pain despite taking your medicines.  You begin passing a large amount of blood or clots in your urine.  You feel extremely weak or faint, or you pass out. This information is not intended to replace advice given to you by your health care provider. Make sure you discuss any questions you have with your health care provider. Document Released: 06/04/2005 Document Revised: 11/10/2015 Document Reviewed: 02/02/2013 Elsevier Interactive Patient Education  2017 [employer].

## 2017-10-01 NOTE — Progress Notes (Signed)
PRIMARY CARE AT [facility] [address] [place] [address] [phone]  Date:  10/01/2017   Name:  NIKOLAJ GERAGHTY   DOB:  06-11-1956   MRN:  [id number]  PCP:  Shawnee Knapp, MD    History of Present Illness:  [name] is a 62 y.o. male patient who presents to PCP with  Chief Complaint  Patient presents with  . Hematuria    3 days ago, burning with urination, and uncomfortable.  5-6/10 pain.  10/10 pain again with urination.  There is hematuria, and blood clots in the urine.  Progressively gross blood in urine.  No abdominal pain, just pain at the penis.  No penile discharge.  No fever.  He did have some subjective chills and diaphoresis.    He had urgency.  He has not noticed an abnormal frequency from an already frequent urination secondary to diuretics.   Constipation recent ly this last week, though he generally has diarrhea and diagnosed with ibs-d.  He takes immodium daily.   Smoked 4ppd 25 years ago for 20 years.    Patient Active Problem List   Diagnosis Date Noted  . Fatty liver 06/01/2017  . Abdominal aortic aneurysm (AAA), 30-34 mm diameter (HCC) 06/01/2017  . Diarrhea 12/05/2016  . Coronary artery disease involving native coronary artery of native heart without angina pectoris 12/01/2016  . Chest pain, pleuritic 01/20/2016  . Superficial bruising of chest wall 01/20/2016  . 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 Dyspnea 11/03/2015  . Lapband APS + hiatal hernia repair Oct 2015 04/05/2014  . Metabolic syndrome X 44/31/5400  . DM type 2, uncontrolled, with renal complications ([place]) 86[birth year]76/1950  . Hearing loss-aides 03/26/2012  . Back pain 12/26/2011  . OSA (obstructive sleep apnea) 12/26/2011  . Cervical disc disease 12/26/2011  . Hypertension 08/29/2011  . Hyperlipidemia 08/29/2011  . BMI 40.0-44.9, adult ([place]) 08/29/2011    Past Medical History:  Diagnosis Date  . Arthritis   . Diabetes mellitus   . Difficult intubation 04/05/2014  . Dyslipidemia   .  Family history of anesthesia complication    pt states took days for his father to awaken after mask was used   . GERD (gastroesophageal reflux disease)    has been having acid reflux since recent endoscopy   . H/O hiatal hernia    repair with lap band, now has ventral hernia midline abd  . Hyperlipidemia   . Hypertension   . Morbid obesity ([place])   . OSA (obstructive sleep apnea)   . Rotator cuff tear   . Seasonal allergies   . Shortness of breath    pt states related to high BP meds with extended walking or climbling stairs  . Sleep apnea     Past Surgical History:  Procedure Laterality Date  . CARDIAC CATHETERIZATION     approx 3 years ago   . COLONOSCOPY WITH PROPOFOL N/A 03/10/2015   Procedure: COLONOSCOPY WITH PROPOFOL;  Surgeon: Milus Banister, MD;  Location: [facility];  Service: Endoscopy;  Laterality: N/A;  . colonscopy      2012  . ESOPHAGOGASTRODUODENOSCOPY (EGD) WITH PROPOFOL N/A 03/10/2015   Procedure: ESOPHAGOGASTRODUODENOSCOPY (EGD) WITH PROPOFOL;  Surgeon: Milus Banister, MD;  Location: [facility];  Service: Endoscopy;  Laterality: N/A;  . HERNIA REPAIR     with gastric banding  . LAPAROSCOPIC GASTRIC BANDING N/A 04/05/2014   Procedure: LAPAROSCOPIC GASTRIC BANDING;  Surgeon: Pedro Earls, MD;  Location: [facility] ORS;  Service: General;  Laterality: N/A;  . MENISCUS REPAIR Left    left knee torn meniscus  . VASECTOMY      Social History   Tobacco Use  . Smoking status: Former Smoker    Packs/day: 4.00    Years: 20.00    Pack years: 80.00    Types: Cigarettes    Start date: 07/19/1973    Last attempt to quit: 12/13/1991    Years since quitting: 25.8  . Smokeless tobacco: Never Used  Substance Use Topics  . Alcohol use: Yes    Alcohol/week: 1.8 - 3.0 oz    Types: 3 - 5 Glasses of wine per week    Comment: takes 3 to 5 glasses of wine nightly   . Drug use: No    Family History  Problem Relation Age of Onset  . Clotting disorder Mother        lung  clot  . Mesothelioma Father   . Hypertension Brother   . Skin cancer Brother   . Colon cancer Neg Hx     Allergies  Allergen Reactions  . Erythromycin Nausea Only  . Penicillins Nausea Only    Upset stomach    Medication list has been reviewed and updated.  Current Outpatient Medications on File Prior to Visit  Medication Sig Dispense Refill  . Ascorbic Acid (VITAMIN C) 100 MG tablet Take 500 mg by mouth once.     Marland Kitchen aspirin 81 MG tablet Take 81 mg by mouth every morning.     Marland Kitchen azithromycin (ZITHROMAX) 250 MG tablet Take 2 tabs PO x 1 dose, then 1 tab PO QD x 4 days 6 tablet 0  . CARTIA XT 240 MG 24 hr capsule Take 1 capsule (240 mg total) by mouth daily. 90 capsule 1  . cholestyramine (QUESTRAN) 4 g packet Take 1 packet (4 g total) by mouth 3 (three) times daily with meals. 90 each 2  . Cyanocobalamin 2500 MCG CHEW Chew 1 tablet by mouth daily.    Marland Kitchen doxepin (SINEQUAN) 10 MG capsule Take 1 capsule (10 mg total) by mouth at bedtime. 30 capsule 3  . fluticasone (FLONASE) 50 MCG/ACT nasal spray Place 2 sprays into both nostrils daily. 16 g 2  . furosemide (LASIX) 20 MG tablet Take 1 tablet (20 mg total) by mouth daily. 90 tablet 1  . glucose blood (TRUETEST TEST) test strip Use as instructed 100 each 2  . ibuprofen (ADVIL,MOTRIN) 200 MG tablet Take 400-600 mg by mouth once as needed for headache or mild pain.    . Insulin Pen Needle (B-D UF III MINI PEN NEEDLES) 31G X 5 MM MISC USE AS DIRECTED ONCE A DAY 100 each 0  . LANTUS SOLOSTAR 100 UNIT/ML Solostar Pen INJECT 80 UNITS INTO SKIN DAILY AT 10 PM, SPLIT INTO 2 DOSES ADMINSTERED AT 2 DIFFERENT SITES  15 mL 6  . lisinopril-hydrochlorothiazide (PRINZIDE,ZESTORETIC) 20-12.5 MG tablet Take 2 tablets by mouth every morning. 180 tablet 1  . metoprolol succinate (TOPROL-XL) 100 MG 24 hr tablet Take 1 tablet (100 mg total) by mouth daily. Take with or immediately following a meal. 90 tablet 3  . Multiple Vitamin (MULTIVITAMIN) tablet Take 1  tablet by mouth every morning.     . neomycin-polymyxin-hydrocortisone (CORTISPORIN) 3.5-10000-1 OTIC suspension Place 1 drop into the right ear 2 (two) times daily.  1  . potassium chloride SA (K-DUR,KLOR-CON) 20 MEQ tablet TAKE ONE TABLET BY MOUTH ONE TIME DAILY  90 tablet 0  . Probiotic Product (PROBIOTIC DAILY  PO) Take 2 capsules by mouth daily.     . rifaximin (XIFAXAN) 550 MG TABS tablet Take 1 tablet (550 mg total) by mouth 3 (three) times daily. 42 tablet 0  . rosuvastatin (CRESTOR) 20 MG tablet Take 1 tablet (20 mg total) by mouth daily. 90 tablet 3   No current facility-administered medications on file prior to visit.     ROS ROS otherwise unremarkable unless listed above.  Physical Examination: BP 135/81   Pulse 74   Temp 98.9 F (37.2 C) (Oral)   Resp 17   Ht 6' (1.829 m)   Wt 298 lb (135.2 kg)   SpO2 98%   BMI 40.42 kg/m  Ideal Body Weight: Weight in (lb) to have BMI = 25: 183.9  Physical Exam  Constitutional: He is oriented to person, place, and time. He appears well-developed and well-nourished. No distress.  HENT:  Head: Normocephalic and atraumatic.  Eyes: No scleral icterus.  Pulmonary/Chest: Effort normal.  Neurological: He is alert and oriented to person, place, and time.  Skin: Skin is warm and dry. He is not diaphoretic.  Psychiatric: He has a normal mood and affect. His behavior is normal.    Results for orders placed or performed in visit on 10/01/17  POCT urinalysis dipstick  Result Value Ref Range   Color, UA yellow yellow   Clarity, UA cloudy (A) clear   Glucose, UA =100 (A) negative mg/dL   Bilirubin, UA negative negative   Ketones, POC UA negative negative mg/dL   Spec Grav, UA 1.020 1.010 - 1.025   Blood, UA moderate (A) negative   pH, UA 6.5 5.0 - 8.0   Protein Ur, POC trace (A) negative mg/dL   Urobilinogen, UA 0.2 0.2 or 1.0 E.U./dL   Nitrite, UA Negative Negative   Leukocytes, UA Large (3+) (A) Negative  POCT Microscopic Urinalysis  ([facility])  Result Value Ref Range   WBC,UR,HPF,POC Moderate (A) None WBC/hpf   RBC,UR,HPF,POC Too numerous to count  (A) None RBC/hpf   Bacteria Many (A) None, Too numerous to count   Mucus Absent Absent   Epithelial Cells, UR Per Microscopy Few (A) None, Too numerous to count cells/hpf    Assessment and Plan: ABOUBACAR MATSUO is a 62 y.o. male who is here today, late afternoon and is accompanied by his wife Margarita Grizzle to discuss concerns of 3d of intermittent dysuria and now w/ gross hematuria with large blood clots which began this morning.   1. Hematuria, gross - suspect cystitis, start bactrim 1 bid x 14d while UClx and labs P (bmp, psa). Low threshhold to refer to urology if can't find etiology on labs or doesn't resolve completely w/ abx treatment.  2. Other microscopic hematuria   3. DM type 2, uncontrolled, with renal complications [facility]) - Optometrist is Christain [place], seen every June - request her to send OV note for confirmation this yr - discuss at next visit.  Microalbumin done on 12/10/16 NEEDS FOOT EXAM AT NEXT OV. Taking Lantus 80u - doesn't notice any difference between splitting 40u at 2 sites vs 80u at one though sometimes will see insulin bubble out after - try changing to [place] 80u as on same copay and hopefully as it is more concentrated at 200u/mL pt can do just one injection and less hypoglycemia/variability since lasts almost twice as long. Could consider restarting metformin which he felt did help with weight loss prior even at very low doses of 500mg  qam now that we know diarrhea is  not linked to this. . .  4. Essential hypertension - cont toprol xl 100 qd, should have ran out of both cartia (dilt) XT 240 qd and lisinopril-hctz 20-12.5 2 tab qam sev wks ago - refill x 6 mos,  6 mos of lasix 20 qd was rx'd 9 mos ago so unsure on compliance but KCl20 was refilled more recently - will be due for refill in 2 wks - will hold off on refilling lasix and K to see when pt asks for them.  . .   5. Hyperlipidemia, unspecified hyperlipidemia type - crestor 20 was restarted > 6 mos ago when he did trial off for a while  to ensure it wasn't causing his diarrhea (it wasn't).  Needs fasting labs at next visit as may need to increase to 40 - last lipid 02/2017 pt was NOT on any statin.  6. Diarrhea, unspecified type - Saw [facility] who dx'd IBS-D following gut infxn from [place] - hopefully will resolve in several yrs, rec best generic otc probiotics. Has cholestyramine to try to thicken stools but not sure if pt ever tried this???  Taking imodium daily  7. BMI 40.0-44.9, adult ([place])   8.      Insomnia - taking doxepin? Helped?  Orders Placed This Encounter  Procedures  . Urine Culture  . PSA  . Basic metabolic panel    Order Specific Question:   Has the patient fasted?    Answer:   No  . Hemoglobin A1c  . POCT urinalysis dipstick  . POCT CBC  . POCT Microscopic Urinalysis ([facility])    Meds ordered this encounter  Medications  . Insulin Degludec (TRESIBA FLEXTOUCH) 200 UNIT/ML SOPN    Sig: Inject 80 Units into the skin daily.    Dispense:  45 mL    Refill:  0  . sulfamethoxazole-trimethoprim (BACTRIM DS,SEPTRA DS) 800-160 MG tablet    Sig: Take 1 tablet by mouth 2 (two) times daily.    Dispense:  28 tablet    Refill:  0    I personally performed the services described in this documentation. Patient was seen in conjunction with PA Colletta [place] English who contributed to above documentation as well and was done  in my presence. The recorded information has been reviewed and considered, and addended by me.   [name], M.D.  Primary Care at [facility] [address] [place], [place] [address] [phone] phone [phone] fax  10/01/17 6:25 PM

## 2017-10-01 NOTE — Telephone Encounter (Signed)
Pt  Has  Blood  In  Urine   With  Burning    Sensation as  Well   When he  Urinates.  No  Flank pain  Noted  Appoint made  Today  At  [facility]       Reason for Disposition . Blood in urine  (Exception: could be normal menstrual bleeding)  Answer Assessment - Initial Assessment Questions 1. COLOR of URINE: "Describe the color of the urine."  (e.g., tea-colored, pink, red, blood clots, bloody)       Dripping  Blood  Bright  Red   Blood    2. ONSET: "When did the bleeding start?"         1  Hour  Ago   3. EPISODES: "How many times has there been blood in the urine?" or "How many times today?"          3  Times  In past    4. PAIN with URINATION: "Is there any pain with passing your urine?" If so, ask: "How bad is the pain?"  (Scale 1-10; or mild, moderate, severe)    - MILD - complains slightly about urination hurting    - MODERATE - interferes with normal activities      - SEVERE - excruciating, unwilling or unable to urinate because of the pain         Burning  Sensation    5. FEVER: "Do you have a fever?" If so, ask: "What is your temperature, how was it measured, and when did it start?"        Feels  Warm  6. ASSOCIATED SYMPTOMS: "Are you passing urine more frequently than usual?"         Yes     Smaller  Amounts    7. OTHER SYMPTOMS: "Do you have any other symptoms?" (e.g., back/flank pain, abdominal pain, vomiting)         No  8. PREGNANCY: "Is there any chance you are pregnant?" "When was your last menstrual period?"      n/a  Protocols used: URINE - BLOOD IN-A-AH

## 2017-10-02 LAB — HEMOGLOBIN A1C
Est. average glucose Bld gHb Est-mCnc: 177 mg/dL
Hgb A1c MFr Bld: 7.8 % — ABNORMAL HIGH (ref 4.8–5.6)

## 2017-10-02 LAB — PSA: Prostate Specific Ag, Serum: 2.2 ng/mL (ref 0.0–4.0)

## 2017-10-02 LAB — BASIC METABOLIC PANEL
BUN/Creatinine Ratio: 14 (ref 10–24)
BUN: 17 mg/dL (ref 8–27)
CO2: 28 mmol/L (ref 20–29)
Calcium: 9.2 mg/dL (ref 8.6–10.2)
Chloride: 96 mmol/L (ref 96–106)
Creatinine, Ser: 1.22 mg/dL (ref 0.76–1.27)
GFR calc Af Amer: 73 mL/min/{1.73_m2} (ref >59)
GFR calc non Af Amer: 63 mL/min/{1.73_m2} (ref >59)
Glucose: 122 mg/dL — ABNORMAL HIGH (ref 65–99)
Potassium: 3.5 mmol/L (ref 3.5–5.2)
Sodium: 141 mmol/L (ref 134–144)

## 2017-10-03 LAB — URINE CULTURE

## 2017-10-04 MED ORDER — LISINOPRIL-HYDROCHLOROTHIAZIDE 20-12.5 MG PO TABS: 2.0000 | ORAL_TABLET | ORAL | 1 refills | Status: DC

## 2017-10-04 MED ORDER — TETRACYCLINE HCL 500 MG PO CAPS: 500.0000 mg | ORAL_CAPSULE | Freq: Three times a day (TID) | ORAL | 0 refills | Status: DC

## 2017-10-04 MED ORDER — CARTIA XT 240 MG PO CP24: 240.0000 mg | ORAL_CAPSULE | Freq: Every day | ORAL | 1 refills | Status: DC

## 2017-10-05 ENCOUNTER — Encounter: Payer: Self-pay | Admitting: Family Medicine

## 2017-10-22 ENCOUNTER — Other Ambulatory Visit: Payer: Self-pay | Admitting: Family Medicine

## 2017-10-23 NOTE — Telephone Encounter (Signed)
yes

## 2017-11-13 ENCOUNTER — Encounter: Payer: Self-pay | Admitting: Family Medicine

## 2017-12-01 ENCOUNTER — Other Ambulatory Visit: Payer: Self-pay | Admitting: Family Medicine

## 2018-01-09 ENCOUNTER — Encounter: Payer: Self-pay | Admitting: *Deleted

## 2018-01-12 ENCOUNTER — Other Ambulatory Visit: Payer: Self-pay | Admitting: Family Medicine

## 2018-01-14 NOTE — Telephone Encounter (Signed)
K-Dur 20 mEq ER tablet  LOV 10/01/17 with Dr. Brigitte Pulse  LR:  10/23/17   #90  Refills:  [place], [place]

## 2018-02-09 ENCOUNTER — Encounter: Payer: Self-pay | Admitting: Family Medicine

## 2018-02-09 MED ORDER — LISINOPRIL-HYDROCHLOROTHIAZIDE 20-12.5 MG PO TABS: 2.0000 | ORAL_TABLET | ORAL | 0 refills | Status: DC

## 2018-02-09 MED ORDER — INSULIN PEN NEEDLE 31G X 5 MM MISC: 11 refills | Status: DC

## 2018-02-09 MED ORDER — CARTIA XT 240 MG PO CP24: 240.0000 mg | ORAL_CAPSULE | Freq: Every day | ORAL | 0 refills | Status: DC

## 2018-02-09 MED ORDER — ROSUVASTATIN CALCIUM 20 MG PO TABS: 20.0000 mg | ORAL_TABLET | Freq: Every day | ORAL | 0 refills | Status: DC

## 2018-02-09 MED ORDER — INSULIN DEGLUDEC 200 UNIT/ML ~~LOC~~ SOPN: 80.0000 [IU] | PEN_INJECTOR | Freq: Every day | SUBCUTANEOUS | 0 refills | Status: DC

## 2018-02-09 MED ORDER — FUROSEMIDE 20 MG PO TABS: 20.0000 mg | ORAL_TABLET | Freq: Every day | ORAL | 0 refills | Status: DC

## 2018-02-09 MED ORDER — POTASSIUM CHLORIDE CRYS ER 20 MEQ PO TBCR: 20.0000 meq | EXTENDED_RELEASE_TABLET | Freq: Every day | ORAL | 0 refills | Status: DC

## 2018-03-20 ENCOUNTER — Encounter: Payer: BLUE CROSS/BLUE SHIELD | Admitting: Family Medicine

## 2018-04-30 ENCOUNTER — Ambulatory Visit (INDEPENDENT_AMBULATORY_CARE_PROVIDER_SITE_OTHER): Payer: BLUE CROSS/BLUE SHIELD | Admitting: Family Medicine

## 2018-04-30 VITALS — BP 124/80 | HR 69 | Temp 97.6°F | Ht 72.0 in | Wt 296.4 lb

## 2018-04-30 DIAGNOSIS — E785 Hyperlipidemia, unspecified: Secondary | ICD-10-CM

## 2018-04-30 DIAGNOSIS — Z0001 Encounter for general adult medical examination with abnormal findings: Secondary | ICD-10-CM

## 2018-04-30 DIAGNOSIS — R29818 Other symptoms and signs involving the nervous system: Secondary | ICD-10-CM

## 2018-04-30 DIAGNOSIS — IMO0002 Reserved for concepts with insufficient information to code with codable children: Secondary | ICD-10-CM

## 2018-04-30 DIAGNOSIS — F10288 Alcohol dependence with other alcohol-induced disorder: Secondary | ICD-10-CM

## 2018-04-30 DIAGNOSIS — R399 Unspecified symptoms and signs involving the genitourinary system: Secondary | ICD-10-CM | POA: Diagnosis not present

## 2018-04-30 DIAGNOSIS — Z23 Encounter for immunization: Secondary | ICD-10-CM

## 2018-04-30 DIAGNOSIS — B353 Tinea pedis: Secondary | ICD-10-CM | POA: Diagnosis not present

## 2018-04-30 DIAGNOSIS — R413 Other amnesia: Secondary | ICD-10-CM

## 2018-04-30 DIAGNOSIS — E1129 Type 2 diabetes mellitus with other diabetic kidney complication: Secondary | ICD-10-CM

## 2018-04-30 DIAGNOSIS — E1165 Type 2 diabetes mellitus with hyperglycemia: Secondary | ICD-10-CM

## 2018-04-30 DIAGNOSIS — Z0000 Encounter for general adult medical examination without abnormal findings: Secondary | ICD-10-CM

## 2018-04-30 DIAGNOSIS — Z125 Encounter for screening for malignant neoplasm of prostate: Secondary | ICD-10-CM | POA: Diagnosis not present

## 2018-04-30 LAB — POCT URINALYSIS DIP (MANUAL ENTRY)
Bilirubin, UA: NEGATIVE
Blood, UA: NEGATIVE
Glucose, UA: NEGATIVE mg/dL
Ketones, POC UA: NEGATIVE mg/dL
Leukocytes, UA: NEGATIVE
Nitrite, UA: NEGATIVE
Protein Ur, POC: NEGATIVE mg/dL
Spec Grav, UA: 1.01 (ref 1.010–1.025)
Urobilinogen, UA: 0.2 E.U./dL
pH, UA: 7 (ref 5.0–8.0)

## 2018-04-30 LAB — POCT GLYCOSYLATED HEMOGLOBIN (HGB A1C): Hemoglobin A1C: 7.1 % — AB (ref 4.0–5.6)

## 2018-04-30 LAB — POC MICROSCOPIC URINALYSIS (UMFC): Mucus: ABSENT

## 2018-04-30 MED ORDER — NYSTATIN 100000 UNIT/GM EX POWD: Freq: Four times a day (QID) | CUTANEOUS | 1 refills | Status: DC

## 2018-04-30 MED ORDER — TAMSULOSIN HCL 0.4 MG PO CAPS: 0.4000 mg | ORAL_CAPSULE | Freq: Every day | ORAL | 0 refills | Status: DC

## 2018-04-30 MED ORDER — KETOCONAZOLE 2 % EX CREA: 1.0000 "application " | TOPICAL_CREAM | Freq: Two times a day (BID) | CUTANEOUS | 2 refills | Status: DC

## 2018-04-30 MED ORDER — ZOSTER VAC RECOMB ADJUVANTED 50 MCG/0.5ML IM SUSR: 0.5000 mL | Freq: Once | INTRAMUSCULAR | 1 refills | Status: AC

## 2018-04-30 MED ORDER — AZITHROMYCIN 250 MG PO TABS: ORAL_TABLET | ORAL | 0 refills | Status: DC

## 2018-04-30 NOTE — Patient Instructions (Addendum)
[email or web address] or go to the [place] website to see if they are having any studies.   If you have lab work done today you will be contacted with your lab results within the next 2 weeks.  If you have not heard from [place] then please contact us. The fastest way to get your results is to register for My Chart.  !!!!!!!!!!!  Restart Triple-Coated Peppermint Oil 180mg  three times a day - find at a health food/natural foods store probably - natural smooth muscle relaxant - the Triple coated means that the peppermint has a coating so that it won't be released in your stomach which worsen your reflux symptoms but instead not be released until it is in your bowels where it is needed to work.   1. Schedule an appointment with your eye doctor. 2. Make a follow-up appointment with the [facility] 3. If the dizziness continues see ENT Dr. Ernesto Rutherford 4. Go see the audiologist Dr. Ernesto Rutherford recommended. 5. Continue with the vitamin B complex supplements.  6. Make an appointment with me in about 6-12 wks for Bilateral hip and foot xrays. We also need to do formal memory testing, can review your labs and MRI at that time, then we will discuss whether there is indication for a neurology or neuropsychology referral for further diagnostic evaluation..   7.  I highly recommend you start seeing a therapist to help you cut down on your alcohol use. Consider [facility] and [facility] and [facility] are both great.  !!!!!!Highly recommend [facility] with Dr. Vanita Ingles or one of his colleagues. [email or web address]  IF you received an x-ray today, you will receive an invoice from [facility]. Please contact [facility] at [phone] with questions or concerns regarding your invoice.   IF you received labwork today, you will receive an invoice from [facility]. Please contact [facility] at  [phone] with questions or concerns regarding your invoice.   Our billing staff will not be able to assist you with questions regarding bills from these companies.  You will be contacted with the lab results as soon as they are available. The fastest way to get your results is to activate your My Chart account. Instructions are located on the last page of this paperwork. If you have not heard from [place] regarding the results in 2 weeks, please contact this office.      What You Need To Know About Alcohol Abuse and Dependence, Adult Alcohol is a widely available drug. People who use alcohol will consume it in varying amounts. People who drink alcohol in excess, and have behavior problems during and after drinking alcohol, may have what is called an alcohol use disorder. Alcohol abuse and alcohol dependence are the two main types of alcohol use disorders:  Alcohol abuse is when you use alcohol too much or too often. You may use alcohol to make yourself feel happy or to reduce stress, but you may have a hard time setting a limit on the amount you drink.  Alcohol dependence is when you use alcohol excessively for a period of time, and your body and brain chemistry changes as a result. This can make it hard to stop drinking because you may start to feel sick or feel different when you do not use alcohol.  How can alcohol abuse and dependence affect me? Alcohol abuse and dependence can have a negative effect on your life. Excessive use of alcohol  may lead to an addiction. You may feel like you need alcohol to function normally. You may drink alcohol before work in the morning, during the day, or as soon as you get home from work in the evening. These actions can result in:  Poor performance at work.  Losing your job.  Financial problems.  Car crashes or criminal charges from driving after drinking alcohol.  Problems in your relationships with friends and family.  Losing the trust and  respect of co-workers, friends, and family.  Drinking heavily over a long period of time can permanently damage your body and brain, and can cause lifelong health issues, such as:  Liver disease.  Heart problems, high blood pressure, or stroke.  Damage to your pancreas.  Certain cancers.  Decreased ability to fight infections.  Numbness or tingling in hands or feet (neuropathy).  Brain damage.  Depression.  Early (premature) death.  When your body craves alcohol, it is easy to drink more than your body can handle. As a result, you may overdose. Alcohol overdose is a serious situation that requires hospitalization. It may lead to permanent injuries or death. What are the benefits of avoiding alcohol use? Limiting or avoiding alcohol can help you:  Avoid risks to your body, brain, and relationships.  Avoid the risk of abusing or becoming dependent on alcohol.  Keep your mind and body healthy. As a result, you may be more likely to accomplish your life goals.  Avoid permanent injury, organ damage, or death due to alcohol use.  What steps can I take to stop drinking?  The best way to avoid alcohol abuse, dependence, and addiction is not to drink at all, or to drink measured amounts. Measured drinking means no more than 1 drink a day for nonpregnant women and 2 drinks a day for men. One drink equals 12 oz of beer, 5 oz of wine, or 1 oz of hard liquor.  Stop drinking if you have been drinking too much. This can be very hard to do if you are used to abusing alcohol. If you find it hard to stop drinking, talk about your experience with someone you trust. This person may be able to help you change your drinking behavior.  Instead of drinking alcohol, do something else, like a hobby or exercise.  Find healthy ways to cope with stress, such as exercise, meditation, or spending time with people you care about.  In social gatherings and places where there may be alcohol, make  intentional choices to drink non-alcohol beverages.  If your family, co-workers, or friends drink, talk to them about supporting you in your efforts to stop drinking. Ask them not to drink around you. Spend more time with people who do not drink alcohol.  If you think that you have an alcohol dependency problem: ? Tell friends or family about your concerns. ? Talk with your health care provider or another health professional about where to get help. ? Work with a Transport planner and a Regulatory affairs officer. ? Consider joining a support group for people who struggle with alcohol abuse, dependence, and addiction. Where to find support: You can get support for preventing alcohol abuse, dependence, and addiction from:  Your health care provider.  Alcoholics Anonymous (AA): NicTax.com.pt  SMART Recovery: www.smartrecovery.org  Local treatment centers or chemical dependency counselors.  Where to find more information: Learn more about alcohol abuse and dependence from:  Centers for Disease Control and Prevention: GoalForum.com.au  [employer] on Alcohol Abuse and Alcoholism:  https://clark.org/  Local AA groups in your community.  Contact a health care provider if:  You drink more or for longer than you intended, on more than one occasion.  You tried to stop drinking or to cut back on how much you drink, but you were not able to.  You often drink to the point of vomiting or passing out.  You want to drink so badly that you cannot think about anything else.  Drinking has created problems in your life, but you continue to drink.  You keep drinking even though you feel anxious, depressed, or have experienced memory loss.  You have stopped doing the things you used to enjoy in order to drink.  You have to drink more than you used to in order to get the effect you want.  You experience anxiety,  sweating, nausea, shakiness, and trouble sleeping when you try to stop drinking.  You have thoughts about hurting yourself or others. If you ever feel like you may hurt yourself or others, or have thoughts about taking your own life, get help right away. You can go to your nearest emergency department or call:  Your local emergency services (911 in the [place]  A suicide crisis helpline, such as the [facility] at 713-535-5615. This is open 24 hours a day.  Summary  Alcohol is a widely available drug. Misusing, abusing, and becoming dependent on alcohol can cause many problems.  It is important to measure and limit the amount of alcohol you consume. It is recommended to limit alcohol use to 1 drink a day for nonpregnant women and 2 drinks a day for men.  The risks associated with drinking too much will have a direct negative impact on your work, relationships, and health.  If you realize that you are having some challenges keeping your drinking under control, find some ways to change your behavior. Hobbies, self calming activities, exercise, or support groups can help.  If you feel you need help with changing your drinking habits, talk with your health care provider, a good friend, or a therapist, or go to an [facility] group. This information is not intended to replace advice given to you by your health care provider. Make sure you discuss any questions you have with your health care provider. Document Released: 05/29/2016 Document Revised: 05/29/2016 Document Reviewed: 05/29/2016 Elsevier Interactive Patient Education  [employer].

## 2018-04-30 NOTE — Progress Notes (Addendum)
Subjective:    Patient ID: [name]; male   DOB: 02/17/1956; 62 y.o.   MRN: [id number]  Chief Complaint  Patient presents with  . Annual Exam    CPE  (zpack refill for travel)  . Fall    after 2-3 glass of wine/ 2-3 falls this past year    HPI Primary Preventative Screenings: Prostate Cancer: STI screening:  Colorectal Cancer: Tobacco use/AAA/Lung/EtOH/Illicit substances:  Cardiac: EKG repeated today Weight/Blood sugar/Diet/Exercise: averaging 3000+steps/d but no formal exercise but wants to restart now that he is retired. BMI Readings from Last 3 Encounters:  04/30/18 40.20 kg/m  10/01/17 40.42 kg/m  06/01/17 39.65 kg/m   Lab Results  Component Value Date   HGBA1C 7.1 (A) 04/30/2018   OTC/Vit/Supp/Herbal: He stopped vit C and probiotics due to stomach issues but restarted a vitamin B complex sev wks ago as well as the gummy mvi.  Dentist/Optho: Saw eye doctor last year.  Immunizations:  Immunization History  Administered Date(s) Administered  . Influenza, High Dose Seasonal PF 04/30/2018  . Influenza,inj,Quad PF,6+ Mos 06/30/2016, 03/14/2017  . Pneumococcal Polysaccharide-23 10/09/2012  . Tdap 02/09/2015    Chronic Medical Conditions: Over the past year is more like vertigo during the day more so when walking and turning in house - but never occurs when sitting/driving - it only happens for moment and instantaneously resolves. Horrible hearing and last levels showed he can only hear 45-50% of women's voices but didn't wear them as had [employer] best they had. His ENT Dr. Ernesto Rutherford at [facility] referred him to the hearing doctor that is next yr. Did not mention dizziness to Dr. 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  W/ most  recent fall he got a large knot on his right inner forearm for a week - felt like an index finger beneath his skin and now has eventually turned into a large bruise.  Tripped and hit his forearm on a little modem box stuck out of the wall - did hit his head once and gave self black eye about 6 mos ago.  Memory sober is not as good as it used to be and now after 4-6 glasses/wine can't remember anymore.  Can carry on a conversation and next day he won't remember it happened at all.   Urine sxs normal at time and at other times feels urge to pee but hesitency and can't get stream started - takes 5 minutes. Much more often in the middle of the night but occ during the day. No dysruia, stream slowed. Applies pressure to bladder to finish and empty.   GI stopped working him up   HTN: Checks BP outside office all the time and with current cuff he will get 135/80 with 68-72 pulse but if cuff is turned sideways it will get 145 on top.   He only takes the doxepin once every 3-4 wks. If he really wants to knock himself out he will take a benadryl and flonase for his sinuses and sleep great.   DM:  Tyler Aas 80u a day.  Just got on Cobra   DOesn't get any w/d sxs from the [facility] when he wakes up.  The longest he has  gone without drinking is the 24 hrs he had to go for his colonoscopy.    Medical History: Past Medical History:  Diagnosis Date  . Abdominal aortic aneurysm (AAA), 30-34 mm diameter (HCC) 06/01/2017   Nml w/ greatest dm 3 cm US Aorta 06/2016, Abd [place] 05/01/2017 showed infrarenal dilatation at 3.4 cm - rec repeat US in 3 yrs.  . Arthritis   . Cervical disc disease 12/26/2011  . Coronary artery disease involving native coronary artery of native heart without angina pectoris 12/01/2016   He had coronary angiography in 2011. There was irregularity within the LAD with up to 30% narrowing. The myocardial perfusion imaging done 11/07/2015 did not demonstrate any evidence of ischemia with a low risk  nuclear stress test other than EF estimated at 47%. Chest CT 01/25/2016 showed diffuse coronary artery calcifications with heavy calcifications in the LAD. Pt seen by cardiology Dr. Linard Millers   . Diabetes mellitus   . Difficult intubation 04/05/2014  . Dyslipidemia   . Family history of anesthesia complication    pt states took days for his father to awaken after mask was used   . Fatty liver 06/01/2017   [place] 05/01/2017  . GERD (gastroesophageal reflux disease)    has been having acid reflux since recent endoscopy   . H/O hiatal hernia    repair with lap band, now has ventral hernia midline abd  . Hearing loss-aides 03/26/2012  . Hyperlipidemia   . Hypertension   . Morbid obesity ([place])   . OSA (obstructive sleep apnea)   . Rotator cuff tear   . Seasonal allergies   . Shortness of breath    pt states related to high BP meds with extended walking or climbling stairs  . Sleep apnea    Past Surgical History:  Procedure Laterality Date  . CARDIAC CATHETERIZATION     approx 3 years ago   . COLONOSCOPY WITH PROPOFOL N/A 03/10/2015   Procedure: COLONOSCOPY WITH PROPOFOL;  Surgeon: Milus Banister, MD;  Location: [facility];  Service: Endoscopy;  Laterality: N/A;  . colonscopy      2012  . ESOPHAGOGASTRODUODENOSCOPY (EGD) WITH PROPOFOL N/A 03/10/2015   Procedure: ESOPHAGOGASTRODUODENOSCOPY (EGD) WITH PROPOFOL;  Surgeon: Milus Banister, MD;  Location: [facility];  Service: Endoscopy;  Laterality: N/A;  . HERNIA REPAIR     with gastric banding  . LAPAROSCOPIC GASTRIC BANDING N/A 04/05/2014   Procedure: LAPAROSCOPIC GASTRIC BANDING;  Surgeon: Pedro Earls, MD;  Location: [facility];  Service: General;  Laterality: N/A;  . MENISCUS REPAIR Left    left knee torn meniscus  . VASECTOMY     Current Outpatient Medications on File Prior to Visit  Medication Sig Dispense Refill  . aspirin 81 MG tablet Take 81 mg by mouth every morning.     Marland Kitchen azithromycin (ZITHROMAX) 250 MG tablet as needed.      Marland Kitchen CARTIA XT 240 MG 24 hr capsule Take 1 capsule (240 mg total) by mouth daily. 90 capsule 0  . doxepin (SINEQUAN) 10 MG capsule Take 1 capsule (10 mg total) by mouth at bedtime. 30 capsule 3  . fluticasone (FLONASE) 50 MCG/ACT nasal spray Place 2 sprays into both nostrils daily. 16 g 2  . furosemide (LASIX) 20 MG tablet Take 1 tablet (20 mg total) by mouth daily. 90 tablet 0  . glucose blood (TRUETEST TEST) test strip Use as instructed 100 each 2  . ibuprofen (ADVIL,MOTRIN) 200 MG tablet Take 400-600 mg by mouth once  as needed for headache or mild pain.    . Insulin Degludec (TRESIBA FLEXTOUCH) 200 UNIT/ML SOPN Inject 80 Units into the skin daily. 45 mL 0  . Insulin Pen Needle (B-D UF III MINI PEN NEEDLES) 31G X 5 MM MISC USE AS DIRECTED ONCE A DAY 100 each 11  . lisinopril-hydrochlorothiazide (PRINZIDE,ZESTORETIC) 20-12.5 MG tablet Take 2 tablets by mouth every morning. 180 tablet 0  . metoprolol succinate (TOPROL-XL) 100 MG 24 hr tablet Take 1 tablet (100 mg total) by mouth daily. Take with or immediately following a meal. 90 tablet 3  . Multiple Vitamin (MULTIVITAMIN) tablet Take 1 tablet by mouth every morning.     . potassium chloride SA (K-DUR,KLOR-CON) 20 MEQ tablet Take 1 tablet (20 mEq total) by mouth daily. 90 tablet 0  . rosuvastatin (CRESTOR) 20 MG tablet Take 1 tablet (20 mg total) by mouth daily. 90 tablet 0   No current facility-administered medications on file prior to visit.    Allergies  Allergen Reactions  . Erythromycin Nausea Only  . Penicillins Nausea Only    Upset stomach  . Quercus Robur Itching   Family History  Problem Relation Age of Onset  . Clotting disorder Mother        lung clot  . Mesothelioma Father   . Hypertension Brother   . Skin cancer Brother   . Colon cancer Neg Hx    Social History   Socioeconomic History  . Marital status: Married    Spouse name: Not on file  . Number of children: 2  . Years of education: Not on file  . Highest  education level: Not on file  Occupational History  . Occupation: owner    Comment: [place]  . Financial resource strain: Not on file  . Food insecurity:    Worry: Not on file    Inability: Not on file  . Transportation needs:    Medical: Not on file    Non-medical: Not on file  Tobacco Use  . Smoking status: Former Smoker    Packs/day: 4.00    Years: 20.00    Pack years: 80.00    Types: Cigarettes    Start date: 07/19/1973    Last attempt to quit: 12/13/1991    Years since quitting: 26.3  . Smokeless tobacco: Never Used  Substance and Sexual Activity  . Alcohol use: Yes    Alcohol/week: 3.0 - 5.0 standard drinks    Types: 3 - 5 Glasses of wine per week    Comment: takes 3 to 5 glasses of wine nightly   . Drug use: No  . Sexual activity: Not on file  Lifestyle  . Physical activity:    Days per week: Not on file    Minutes per session: Not on file  . Stress: Not on file  Relationships  . Social connections:    Talks on phone: Not on file    Gets together: Not on file    Attends religious service: Not on file    Active member of club or organization: Not on file    Attends meetings of clubs or organizations: Not on file    Relationship status: Not on file  Other Topics Concern  . Not on file  Social History Narrative  . Not on file   Depression screen [facility] 2/9 04/30/2018 10/01/2017 03/14/2017 11/17/2016 08/27/2016  Decreased Interest 0 0 0 0 0  Down, Depressed, Hopeless 0 0 0 0 0  PHQ -  2 Score 0 0 0 0 0  Altered sleeping - - - 1 -  Tired, decreased energy - - - 1 -  Change in appetite - - - 1 -  Feeling bad or failure about yourself  - - - 0 -  Trouble concentrating - - - 0 -  Moving slowly or fidgety/restless - - - 0 -  Suicidal thoughts - - - 0 -  PHQ-9 Score - - - 3 -     Review of Systems  Gastrointestinal:       Severe digestion problems and GAS  Genitourinary: Positive for frequency and urgency.       Prostate concerns    Neurological: Positive for dizziness, focal weakness (right arm and legs) and weakness.       Memory, falling   As noted in HPI All others reviewed and negative.     Objective:  BP (!) 146/84 (BP Location: Left Arm, Patient Position: Sitting, Cuff Size: Large)   Pulse 69   Temp 97.6 F (36.4 C) (Oral)   Ht 6' (1.829 m)   Wt 296 lb 6.4 oz (134.4 kg)   SpO2 96%   BMI 40.20 kg/m  No exam data present Physical Exam  Constitutional: He is oriented to person, place, and time. He appears well-developed and well-nourished. No distress.  HENT:  Head: Normocephalic and atraumatic.  Eyes: Pupils are equal, round, and reactive to light. Conjunctivae are normal. No scleral icterus.  Neck: Normal range of motion. Neck supple. No thyromegaly present.  Cardiovascular: Normal rate, regular rhythm, normal heart sounds and intact distal pulses.  Pulmonary/Chest: Effort normal and breath sounds normal. No respiratory distress.  Musculoskeletal: He exhibits no edema.  Lymphadenopathy:    He has no cervical adenopathy.  Neurological: He is alert and oriented to person, place, and time.  Skin: Skin is warm and dry. He is not diaphoretic.  Psychiatric: He has a normal mood and affect. His behavior is normal.   BP recheck 124/80  Diabetic Foot Exam - Simple   Simple Foot Form Diabetic Foot exam was performed with the following findings:  Yes 04/30/2018 10:53 AM  Visual Inspection See comments:  Yes Sensation Testing Intact to touch and monofilament testing bilaterally:  Yes Pulse Check Posterior Tibialis and Dorsalis pulse intact bilaterally:  Yes Comments Both feet are a bit swollen and he stated that there is numbness and tingling from time to time.      EKG: NSR, no acute ischemic changes noted. No significant change noted when compared to prior EKG done .   I have personally reviewed the EKG tracing and agree with the computer interpretation with the exception of   [place]  TESTING Office Visit on 04/30/2018  Component Date Value Ref Range Status  . Hemoglobin A1C 04/30/2018 7.1* 4.0 - 5.6 % Final  . Color, UA 04/30/2018 yellow  yellow Final  . Clarity, UA 04/30/2018 clear  clear Final  . Glucose, UA 04/30/2018 negative  negative mg/dL Final  . Bilirubin, UA 04/30/2018 negative  negative Final  . Ketones, POC UA 04/30/2018 negative  negative mg/dL Final  . Spec Grav, UA 04/30/2018 1.010  1.010 - 1.025 Final  . Blood, UA 04/30/2018 negative  negative Final  . pH, UA 04/30/2018 7.0  5.0 - 8.0 Final  . Protein Ur, POC 04/30/2018 negative  negative mg/dL Final  . Urobilinogen, UA 04/30/2018 0.2  0.2 or 1.0 E.U./dL Final  . Nitrite, UA 04/30/2018 Negative  Negative Final  .  Leukocytes, UA 04/30/2018 Negative  Negative Final     Assessment & Plan:   1. Annual physical exam   2. Need for influenza vaccination   3. DM type 2, uncontrolled, with renal complications ([place])   4. Hyperlipidemia, unspecified hyperlipidemia type   5. Screening for prostate cancer   6. UTI symptoms   7. Tinea pedis of both feet   8. Memory loss of unknown cause   9. Memory loss   10. Other symptoms and signs involving the nervous system    Rec cut down on alcohol to help improve weight, sleep, DM, GI tract - check for folate and niacin def, B12 def Encouraged to come up w/ plan for decrease/cessation other than just limiting self as unlikely to work. Check brain MRI if sxs cont  Patient will continue on current chronic medications other than changes noted above, so ok to refill when needed.   Reviewed all health maintenance recommendations per USPSTF guidelines.   See after visit summary for patient specific instructions.  Orders Placed This Encounter  Procedures  . Urine Culture  . MR Brain Wo Contrast    Open MRI please pt requests due to abdominal girth 143 cm Epic or=der Ins-bcbs Wt 290/ ht 6'0/ yes claus/ no needs/ no stents or implants/ no metal removed from eyes by  physician and no metal in body no surgeries/ fwp and pt    Standing Status:   Future    Standing Expiration Date:   07/01/2019    Order Specific Question:   What is the patient's sedation requirement?    Answer:   No Sedation    Order Specific Question:   Does the patient have a pacemaker or implanted devices?    Answer:   Yes    Comments:   Lapband    Order Specific Question:   Preferred imaging location?    Answer:   [facility] (table limit-550lbs)    Order Specific Question:   Radiology Contrast Protocol - do NOT remove file path    Answer:   \\[email or web address]  . Flu vaccine HIGH DOSE PF (Fluzone High dose)  . Comprehensive metabolic panel  . CBC with Differential/Platelet  . Lipid panel  . PSA  . Microalbumin/Creatinine Ratio, Urine  . TSH  . Vitamin B12  . Folate  . Vitamin B1  . RPR  . POCT glycosylated hemoglobin (Hb A1C)  . POCT urinalysis dipstick  . POCT Microscopic Urinalysis ([facility])  . EKG 12-Lead  . HM DIABETES FOOT EXAM    Meds ordered this encounter  Medications  . nystatin (MYCOSTATIN/NYSTOP) powder    Sig: Apply topically 4 (four) times daily.    Dispense:  60 g    Refill:  1  . ketoconazole (NIZORAL) 2 % cream    Sig: Apply 1 application topically 2 (two) times daily.    Dispense:  60 g    Refill:  2  . azithromycin (ZITHROMAX) 250 MG tablet    Sig: as needed.    Dispense:  6 each    Refill:  0  . Zoster Vaccine Adjuvanted [facility]) injection    Sig: Inject 0.5 mLs into the muscle once for 1 dose. Repeat dose once 2 to 6 months later.    Dispense:  0.5 mL    Refill:  1  . tamsulosin (FLOMAX) 0.4 MG CAPS capsule    Sig: Take 1 capsule (0.4 mg total) by mouth daily after supper.    Dispense:  90 capsule  Refill:  0    Patient verbalized to me that they understand the following: diagnosis, what is being done for them, what to expect and what should be done at home.  Their questions have been answered. They  understand that I am unable to predict every possible medication interaction or adverse outcome and that if any unexpected symptoms arise, they should contact us and their pharmacist, as well as never hesitate to seek urgent/emergent care at [facility] or ER if they think it might be warranted.    [name], MD, MPH [facility] [address] [place], [place]  [address] [phone] Office phone  [phone] Office fax   04/30/18 12:37 PM

## 2018-05-01 LAB — TSH: TSH: 3.48 u[IU]/mL (ref 0.450–4.500)

## 2018-05-01 LAB — LIPID PANEL
Chol/HDL Ratio: 2.8 ratio (ref 0.0–5.0)
Cholesterol, Total: 151 mg/dL (ref 100–199)
HDL: 54 mg/dL (ref >39)
LDL Calculated: 67 mg/dL (ref 0–99)
Triglycerides: 152 mg/dL — ABNORMAL HIGH (ref 0–149)
VLDL Cholesterol Cal: 30 mg/dL (ref 5–40)

## 2018-05-01 LAB — CBC WITH DIFFERENTIAL/PLATELET
Basophils Absolute: 0 10*3/uL (ref 0.0–0.2)
Basos: 1 %
EOS (ABSOLUTE): 0.1 10*3/uL (ref 0.0–0.4)
Eos: 2 %
Hematocrit: 45.8 % (ref 37.5–51.0)
Hemoglobin: 15.9 g/dL (ref 13.0–17.7)
Immature Grans (Abs): 0 10*3/uL (ref 0.0–0.1)
Immature Granulocytes: 0 %
Lymphocytes Absolute: 1.8 10*3/uL (ref 0.7–3.1)
Lymphs: 33 %
MCH: 33.9 pg — ABNORMAL HIGH (ref 26.6–33.0)
MCHC: 34.7 g/dL (ref 31.5–35.7)
MCV: 98 fL — ABNORMAL HIGH (ref 79–97)
Monocytes Absolute: 0.5 10*3/uL (ref 0.1–0.9)
Monocytes: 9 %
Neutrophils Absolute: 2.9 10*3/uL (ref 1.4–7.0)
Neutrophils: 55 %
Platelets: 204 10*3/uL (ref 150–450)
RBC: 4.69 x10E6/uL (ref 4.14–5.80)
RDW: 12.5 % (ref 12.3–15.4)
WBC: 5.3 10*3/uL (ref 3.4–10.8)

## 2018-05-01 LAB — COMPREHENSIVE METABOLIC PANEL
ALT: 46 IU/L — ABNORMAL HIGH (ref 0–44)
AST: 55 IU/L — ABNORMAL HIGH (ref 0–40)
Albumin/Globulin Ratio: 1.9 (ref 1.2–2.2)
Albumin: 4.4 g/dL (ref 3.6–4.8)
Alkaline Phosphatase: 52 IU/L (ref 39–117)
BUN/Creatinine Ratio: 10 (ref 10–24)
BUN: 11 mg/dL (ref 8–27)
Bilirubin Total: 0.6 mg/dL (ref 0.0–1.2)
CO2: 26 mmol/L (ref 20–29)
Calcium: 9.2 mg/dL (ref 8.6–10.2)
Chloride: 93 mmol/L — ABNORMAL LOW (ref 96–106)
Creatinine, Ser: 1.14 mg/dL (ref 0.76–1.27)
GFR calc Af Amer: 79 mL/min/{1.73_m2} (ref >59)
GFR calc non Af Amer: 68 mL/min/{1.73_m2} (ref >59)
Globulin, Total: 2.3 g/dL (ref 1.5–4.5)
Glucose: 140 mg/dL — ABNORMAL HIGH (ref 65–99)
Potassium: 3.4 mmol/L — ABNORMAL LOW (ref 3.5–5.2)
Sodium: 138 mmol/L (ref 134–144)
Total Protein: 6.7 g/dL (ref 6.0–8.5)

## 2018-05-01 LAB — MICROALBUMIN / CREATININE URINE RATIO
Creatinine, Urine: 32.4 mg/dL
Microalb/Creat Ratio: <9.3 mg/g creat (ref 0.0–30.0)
Microalbumin, Urine: <3 ug/mL

## 2018-05-01 LAB — PSA: Prostate Specific Ag, Serum: 1 ng/mL (ref 0.0–4.0)

## 2018-05-01 LAB — URINE CULTURE

## 2018-05-04 LAB — VITAMIN B1: Thiamine: 207.9 nmol/L — ABNORMAL HIGH (ref 66.5–200.0)

## 2018-05-04 LAB — VITAMIN B12: Vitamin B-12: 481 pg/mL (ref 232–1245)

## 2018-05-04 LAB — RPR: RPR Ser Ql: NONREACTIVE

## 2018-05-04 LAB — FOLATE: Folate: >20 ng/mL (ref >3.0)

## 2018-05-08 ENCOUNTER — Telehealth: Payer: Self-pay | Admitting: Family Medicine

## 2018-05-08 NOTE — Telephone Encounter (Signed)
Called and spoke with pt regarding their appt with Dr. Shaw on 07/28/18. Due to Dr. Shaw’s template change, appt time is pushed up by 10 minutes. I asked pt if this was acceptable and pt agreed. I advised pt of time, building number and late policy. Pt acknowledged.  °

## 2018-05-10 ENCOUNTER — Encounter: Payer: Self-pay | Admitting: Family Medicine

## 2018-05-14 ENCOUNTER — Other Ambulatory Visit: Payer: Self-pay

## 2018-05-20 ENCOUNTER — Ambulatory Visit
Admission: RE | Admit: 2018-05-20 | Discharge: 2018-05-20 | Disposition: A | Discharge status: Home / Self Care | Payer: BLUE CROSS/BLUE SHIELD | Source: Ambulatory Visit | Attending: Family Medicine | Admitting: Family Medicine

## 2018-05-20 DIAGNOSIS — R413 Other amnesia: Secondary | ICD-10-CM

## 2018-05-20 DIAGNOSIS — R29818 Other symptoms and signs involving the nervous system: Secondary | ICD-10-CM

## 2018-05-21 ENCOUNTER — Other Ambulatory Visit: Payer: Self-pay | Admitting: Family Medicine

## 2018-05-27 ENCOUNTER — Encounter: Payer: Self-pay | Admitting: Family Medicine

## 2018-05-29 ENCOUNTER — Telehealth: Payer: Self-pay | Admitting: Family Medicine

## 2018-05-29 NOTE — Telephone Encounter (Signed)
Copied from [place] [id number]. Topic: General - Other >> May 28, 2018  2:47 PM Oneta Rack wrote: Relation to pt: self Call back number: [phone]   Reason for call:  Patient states he would like to speak with a nurse regarding his lab results, patient able to view thru [place] but requesting a nurse to interpret via phone  or thru my chart regarding lab and imaging results, please advise

## 2018-05-30 NOTE — Telephone Encounter (Signed)
Patient would like some clarification on his lab results.

## 2018-05-31 ENCOUNTER — Telehealth: Payer: Self-pay | Admitting: Family Medicine

## 2018-05-31 NOTE — Telephone Encounter (Signed)
Sent pt Mychart note and pt alerted

## 2018-06-02 ENCOUNTER — Telehealth: Payer: Self-pay | Admitting: Emergency Medicine

## 2018-06-02 NOTE — Telephone Encounter (Signed)
[facility] called and reported mild gerneralized atrophy , no accute abdomalities

## 2018-06-10 NOTE — Telephone Encounter (Signed)
Patient needs [name] and any other script sent of 06/02/2018 that was written as a paper script, resent to [facility] on [address] as an E-script. All medications should be 90 day supply

## 2018-06-12 ENCOUNTER — Other Ambulatory Visit: Payer: Self-pay | Admitting: Family Medicine

## 2018-06-12 ENCOUNTER — Other Ambulatory Visit: Payer: Self-pay | Admitting: *Deleted

## 2018-06-12 DIAGNOSIS — E1129 Type 2 diabetes mellitus with other diabetic kidney complication: Secondary | ICD-10-CM

## 2018-06-12 DIAGNOSIS — E1165 Type 2 diabetes mellitus with hyperglycemia: Principal | ICD-10-CM

## 2018-06-12 DIAGNOSIS — IMO0002 Reserved for concepts with insufficient information to code with codable children: Secondary | ICD-10-CM

## 2018-06-12 MED ORDER — INSULIN DEGLUDEC 200 UNIT/ML ~~LOC~~ SOPN: 80.0000 [IU] | PEN_INJECTOR | Freq: Every day | SUBCUTANEOUS | 1 refills | Status: DC

## 2018-06-12 NOTE — Telephone Encounter (Signed)
Prescription sent

## 2018-06-17 ENCOUNTER — Other Ambulatory Visit: Payer: Self-pay | Admitting: Family Medicine

## 2018-06-17 NOTE — Telephone Encounter (Signed)
Requested medication (s) are due for refill today: yes  Requested medication (s) are on the active medication list: yes  Last refill:  02/09/18  Future visit scheduled: yes  Notes to clinic:  Diuretic combos failed.  Requested Prescriptions  Pending Prescriptions Disp Refills   lisinopril-hydrochlorothiazide (PRINZIDE,ZESTORETIC) 20-12.5 MG tablet [Pharmacy Med Name: Lisinopril-hydroCHLOROthiazide Oral Tablet 20-12.5 MG] 180 tablet 0    Sig: Take 2 tablets by mouth every morning.     Cardiovascular:  ACEI + Diuretic Combos Failed - 06/17/2018 10:51 AM      Failed - K in normal range and within 180 days    Potassium  Date Value Ref Range Status  04/30/2018 3.4 (L) 3.5 - 5.2 mmol/L Final         Passed - Na in normal range and within 180 days    Sodium  Date Value Ref Range Status  04/30/2018 138 134 - 144 mmol/L Final         Passed - Cr in normal range and within 180 days    Creat  Date Value Ref Range Status  01/03/2016 1.33 (H) 0.70 - 1.25 mg/dL Final    Comment:      For patients > or = 63 years of age: The upper reference limit for Creatinine is approximately 13% higher for people identified as African-American.      Creatinine, Ser  Date Value Ref Range Status  04/30/2018 1.14 0.76 - 1.27 mg/dL Final         Passed - Ca in normal range and within 180 days    Calcium  Date Value Ref Range Status  04/30/2018 9.2 8.6 - 10.2 mg/dL Final         Passed - Patient is not pregnant      Passed - Last BP in normal range    BP Readings from Last 1 Encounters:  04/30/18 124/80         Passed - Valid encounter within last 6 months    Recent Outpatient Visits          1 month ago Annual physical exam   [facility], Laurey Arrow, MD   8 months ago Hematuria, gross   [facility], Laurey Arrow, MD   1 year ago DM type 2, uncontrolled, with renal complications [facility])   [facility], Laurey Arrow, MD   1 year ago Essential hypertension   [facility], Laurey Arrow, MD   1 year ago Infective otitis externa of right ear   [facility], Laurey Arrow, MD      Future Appointments            In 1 month Brigitte Pulse Laurey Arrow, MD [facility] [place], [facility]

## 2018-06-23 ENCOUNTER — Other Ambulatory Visit: Payer: Self-pay | Admitting: Family Medicine

## 2018-06-23 ENCOUNTER — Telehealth: Payer: Self-pay | Admitting: Family Medicine

## 2018-06-23 NOTE — Telephone Encounter (Signed)
Copied from [place] [id number]. Topic: Quick Communication - Rx Refill/Question >> Jun 23, 2018  2:24 PM Windy Kalata wrote: Medication: lisinopril-hydrochlorothiazide (PRINZIDE,ZESTORETIC) 20-12.5 MG tablet  Has the patient contacted their pharmacy? Yes.   (Agent: If no, request that the patient contact the pharmacy for the refill.) (Agent: If yes, when and what did the pharmacy advise?)Call office for refill  Preferred Pharmacy (with phone number or street name): [facility] [address] [place] [phone] (Phone) [phone] (Fax)    Agent: Please be advised that RX refills may take up to 3 business days. We ask that you follow-up with your pharmacy.

## 2018-06-23 NOTE — Telephone Encounter (Unsigned)
Copied from [place] [id number]. Topic: General - Inquiry >> Jun 23, 2018  2:26 PM [name] wrote: Reason for CRM: Patient states that his Tyler Aas Flextouch is going to be going up to over $700.00, please advise if there is something else that can be prescribed for him .  Best call back is [phone]

## 2018-06-24 NOTE — Telephone Encounter (Signed)
Can we call something else in

## 2018-06-26 NOTE — Telephone Encounter (Signed)
Please let the patient know that his plan may have changed this January. If the [name] is not covered insurance will either cover it after a deductible or it will cover something else. I would like him to call his insurance or check the mailing sent out by insurance. Once we know which medication is no on formulary I can change it.

## 2018-06-27 NOTE — Telephone Encounter (Signed)
Called pt and LVM for pt to call the office back about Rx details.

## 2018-07-01 ENCOUNTER — Encounter: Payer: Self-pay | Admitting: Family Medicine

## 2018-07-01 LAB — HM DIABETES EYE EXAM

## 2018-07-04 ENCOUNTER — Encounter: Payer: Self-pay | Admitting: Family Medicine

## 2018-07-04 ENCOUNTER — Telehealth: Payer: Self-pay | Admitting: Family Medicine

## 2018-07-04 NOTE — Telephone Encounter (Signed)
MyChart message sent to pt about their appointment on 07/28/18 with Dr Brigitte Pulse.

## 2018-07-25 ENCOUNTER — Encounter: Payer: Self-pay | Admitting: Family Medicine

## 2018-07-28 ENCOUNTER — Ambulatory Visit: Payer: Self-pay | Admitting: Family Medicine

## 2018-08-15 ENCOUNTER — Ambulatory Visit (INDEPENDENT_AMBULATORY_CARE_PROVIDER_SITE_OTHER): Payer: BLUE CROSS/BLUE SHIELD | Admitting: Family Medicine

## 2018-08-15 ENCOUNTER — Other Ambulatory Visit: Payer: Self-pay

## 2018-08-15 VITALS — BP 136/87 | HR 71 | Temp 98.2°F | Resp 18 | Ht 71.85 in | Wt 292.2 lb

## 2018-08-15 DIAGNOSIS — Z6839 Body mass index (BMI) 39.0-39.9, adult: Secondary | ICD-10-CM

## 2018-08-15 DIAGNOSIS — I10 Essential (primary) hypertension: Secondary | ICD-10-CM

## 2018-08-15 DIAGNOSIS — E782 Mixed hyperlipidemia: Secondary | ICD-10-CM

## 2018-08-15 DIAGNOSIS — G4700 Insomnia, unspecified: Secondary | ICD-10-CM

## 2018-08-15 DIAGNOSIS — E1159 Type 2 diabetes mellitus with other circulatory complications: Secondary | ICD-10-CM | POA: Diagnosis not present

## 2018-08-15 DIAGNOSIS — R197 Diarrhea, unspecified: Secondary | ICD-10-CM

## 2018-08-15 DIAGNOSIS — Z794 Long term (current) use of insulin: Secondary | ICD-10-CM

## 2018-08-15 DIAGNOSIS — R399 Unspecified symptoms and signs involving the genitourinary system: Secondary | ICD-10-CM

## 2018-08-15 LAB — POCT GLYCOSYLATED HEMOGLOBIN (HGB A1C): Hemoglobin A1C: 7.1 % — AB (ref 4.0–5.6)

## 2018-08-15 LAB — POCT URINALYSIS DIP (MANUAL ENTRY)
Bilirubin, UA: NEGATIVE
Blood, UA: NEGATIVE
Glucose, UA: NEGATIVE mg/dL
Ketones, POC UA: NEGATIVE mg/dL
Leukocytes, UA: NEGATIVE
Nitrite, UA: NEGATIVE
Protein Ur, POC: NEGATIVE mg/dL
Spec Grav, UA: 1.015 (ref 1.010–1.025)
Urobilinogen, UA: 0.2 E.U./dL
pH, UA: 7 (ref 5.0–8.0)

## 2018-08-15 LAB — GLUCOSE, POCT (MANUAL RESULT ENTRY): POC Glucose: 103 mg/dl — AB (ref 70–99)

## 2018-08-15 MED ORDER — FUROSEMIDE 20 MG PO TABS: 20.0000 mg | ORAL_TABLET | Freq: Every day | ORAL | 1 refills | Status: DC

## 2018-08-15 MED ORDER — INSULIN DEGLUDEC 200 UNIT/ML ~~LOC~~ SOPN: 80.0000 [IU] | PEN_INJECTOR | Freq: Every day | SUBCUTANEOUS | 1 refills | Status: DC

## 2018-08-15 MED ORDER — INSULIN PEN NEEDLE 31G X 5 MM MISC: 11 refills | Status: DC

## 2018-08-15 MED ORDER — LISINOPRIL-HYDROCHLOROTHIAZIDE 20-12.5 MG PO TABS: 2.0000 | ORAL_TABLET | ORAL | 1 refills | Status: DC

## 2018-08-15 MED ORDER — METOPROLOL SUCCINATE ER 100 MG PO TB24: ORAL_TABLET | ORAL | 2 refills | Status: DC

## 2018-08-15 MED ORDER — AZITHROMYCIN 250 MG PO TABS: ORAL_TABLET | ORAL | 0 refills | Status: DC

## 2018-08-15 MED ORDER — TAMSULOSIN HCL 0.4 MG PO CAPS: 0.4000 mg | ORAL_CAPSULE | Freq: Every day | ORAL | 0 refills | Status: DC

## 2018-08-15 MED ORDER — ROSUVASTATIN CALCIUM 20 MG PO TABS: 20.0000 mg | ORAL_TABLET | Freq: Every day | ORAL | 1 refills | Status: DC

## 2018-08-15 MED ORDER — POTASSIUM CHLORIDE CRYS ER 20 MEQ PO TBCR: 20.0000 meq | EXTENDED_RELEASE_TABLET | Freq: Every day | ORAL | 1 refills | Status: DC

## 2018-08-15 MED ORDER — CARTIA XT 240 MG PO CP24: 240.0000 mg | ORAL_CAPSULE | Freq: Every day | ORAL | 1 refills | Status: DC

## 2018-08-15 NOTE — Patient Instructions (Signed)
° ° ° °  If you have lab work done today you will be contacted with your lab results within the next 2 weeks.  If you have not heard from us then please contact us. The fastest way to get your results is to register for My Chart. ° ° °IF you received an x-ray today, you will receive an invoice from [facility]. Please contact [facility] at [phone] with questions or concerns regarding your invoice.  ° °IF you received labwork today, you will receive an invoice from [facility]. Please contact [facility] at [phone] with questions or concerns regarding your invoice.  ° °Our billing staff will not be able to assist you with questions regarding bills from these companies. ° °You will be contacted with the lab results as soon as they are available. The fastest way to get your results is to activate your My Chart account. Instructions are located on the last page of this paperwork. If you have not heard from us regarding the results in 2 weeks, please contact this office. °  ° ° ° °

## 2018-08-15 NOTE — Progress Notes (Signed)
Subjective:    Patient: [name]  DOB: Jun 05, 1956; 63 y.o.   MRN: [id number]  Chief Complaint  Patient presents with  . Diabetes    f/u- CHRONIC MEDICAL CONDITIONS    HPI Over the past year is more like vertigo during the day more so when walking and turning in house - but never occurs when sitting/driving - it only happens for moment and instantaneously resolves. Horrible hearing and last levels showed he can only hear 45-50% of women's voices but didn't wear them as had [employer] best they had. His ENT Dr. Ernesto Rutherford at [facility] referred him to the hearing doctor that is next yr. Did not mention dizziness to Dr. Ernesto Rutherford Still having some vertigo sxs  Has not seen Allyson Sabal in a number of years  Has now retired - a 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   Urine sxs improved so never started the flomax.  GI stopped working him up - h as been able to get down to 1/2 of an imodium/day.  Still has stomach ache and nausea every day.   HTN: Checks BP outside office all the time and with current cuff he will get 135/80 with 68-72 pulse but if cuff is turned sideways it will get 145 on top.   He only takes the doxepin once every 3-4 wks. If he really wants to knock himself out he will take a benadryl and flonase for his sinuses and sleep great.   DM:  Tyler Aas 80u a day.  CBGs have been great - 100 - 120 in the a.m. fasting in the morning   Requests another zpack for his upcoming travels abroad  Needs any mention of alcohol use deleted from medical records  Has had 2-3 months of left elbow pain at [place].     Medical History Past Medical History:  Diagnosis Date  . Abdominal aortic aneurysm (AAA), 30-34 mm diameter (HCC) 06/01/2017   Nml w/ greatest dm 3 cm US Aorta 06/2016, Abd [place] 05/01/2017 showed  infrarenal dilatation at 3.4 cm - rec repeat US in 3 yrs.  . Arthritis   . Cervical disc disease 12/26/2011  . Coronary artery disease involving native coronary artery of native heart without angina pectoris 12/01/2016   He had coronary angiography in 2011. There was irregularity within the LAD with up to 30% narrowing. The myocardial perfusion imaging done 11/07/2015 did not demonstrate any evidence of ischemia with a low risk nuclear stress test other than EF estimated at 47%. Chest CT 01/25/2016 showed diffuse coronary artery calcifications with heavy calcifications in the LAD. Pt seen by cardiology Dr. Linard Millers   . Diabetes mellitus   . Difficult intubation 04/05/2014  . Dyslipidemia   . Family history of anesthesia complication    pt states took days for his father to awaken after mask was used   . Fatty liver 06/01/2017   [place] 05/01/2017  . GERD (gastroesophageal reflux disease)    has been having acid reflux since recent endoscopy   . H/O hiatal hernia    repair with lap band, now has ventral hernia midline abd  . Hearing loss-aides 03/26/2012  . Hyperlipidemia   . Hypertension   . Morbid obesity ([place])   . OSA (obstructive sleep apnea)   . Rotator cuff tear   .  Seasonal allergies   . Shortness of breath    pt states related to high BP meds with extended walking or climbling stairs  . Sleep apnea    Past Surgical History:  Procedure Laterality Date  . CARDIAC CATHETERIZATION     approx 3 years ago   . COLONOSCOPY WITH PROPOFOL N/A 03/10/2015   Procedure: COLONOSCOPY WITH PROPOFOL;  Surgeon: Milus Banister, MD;  Location: [facility];  Service: Endoscopy;  Laterality: N/A;  . colonscopy      2012  . ESOPHAGOGASTRODUODENOSCOPY (EGD) WITH PROPOFOL N/A 03/10/2015   Procedure: ESOPHAGOGASTRODUODENOSCOPY (EGD) WITH PROPOFOL;  Surgeon: Milus Banister, MD;  Location: [facility];  Service: Endoscopy;  Laterality: N/A;  . HERNIA REPAIR     with gastric banding  . LAPAROSCOPIC GASTRIC  BANDING N/A 04/05/2014   Procedure: LAPAROSCOPIC GASTRIC BANDING;  Surgeon: Pedro Earls, MD;  Location: [facility];  Service: General;  Laterality: N/A;  . MENISCUS REPAIR Left    left knee torn meniscus  . VASECTOMY     Current Outpatient Medications on File Prior to Visit  Medication Sig Dispense Refill  . aspirin 81 MG tablet Take 81 mg by mouth every morning.     Marland Kitchen azithromycin (ZITHROMAX) 250 MG tablet as needed. 6 each 0  . CARTIA XT 240 MG 24 hr capsule TAKE ONE CAPSULE BY MOUTH ONE TIME DAILY  90 capsule 0  . fluticasone (FLONASE) 50 MCG/ACT nasal spray Place 2 sprays into both nostrils daily. 16 g 2  . furosemide (LASIX) 20 MG tablet TAKE ONE TABLET BY MOUTH ONE TIME DAILY  90 tablet 0  . glucose blood (TRUETEST TEST) test strip Use as instructed 100 each 2  . ibuprofen (ADVIL,MOTRIN) 200 MG tablet Take 400-600 mg by mouth once as needed for headache or mild pain.    . Insulin Degludec (TRESIBA FLEXTOUCH) 200 UNIT/ML SOPN Inject 80 Units into the skin daily. 45 mL 1  . Insulin Pen Needle (B-D UF III MINI PEN NEEDLES) 31G X 5 MM MISC USE AS DIRECTED ONCE A DAY 100 each 11  . ketoconazole (NIZORAL) 2 % cream Apply 1 application topically 2 (two) times daily. 60 g 2  . lisinopril-hydrochlorothiazide (PRINZIDE,ZESTORETIC) 20-12.5 MG tablet TAKE 2 TABLETS BY MOUTH EVERY MORNING  180 tablet 0  . metoprolol succinate (TOPROL-XL) 100 MG 24 hr tablet TAKE 1 TABLET BY MOUTH DAILY WITH OR IMMEDIATELY FOLLOWING A MEAL  90 tablet 2  . Multiple Vitamin (MULTIVITAMIN) tablet Take 1 tablet by mouth every morning.     . nystatin (MYCOSTATIN/NYSTOP) powder Apply topically 4 (four) times daily. 60 g 1  . potassium chloride SA (K-DUR,KLOR-CON) 20 MEQ tablet TAKE ONE TABLET BY MOUTH ONE TIME DAILY  90 tablet 0  . rosuvastatin (CRESTOR) 20 MG tablet TAKE ONE TABLET BY MOUTH ONE TIME DAILY  90 tablet 0  . tamsulosin (FLOMAX) 0.4 MG CAPS capsule Take 1 capsule (0.4 mg total) by mouth daily after supper. 90  capsule 0  . doxepin (SINEQUAN) 10 MG capsule Take 1 capsule (10 mg total) by mouth at bedtime. (Patient not taking: Reported on 08/15/2018) 30 capsule 3   No current facility-administered medications on file prior to visit.    Allergies  Allergen Reactions  . Erythromycin Nausea Only  . Penicillins Nausea Only    Upset stomach  . Quercus Robur Itching   Family History  Problem Relation Age of Onset  . Clotting disorder Mother  lung clot  . Mesothelioma Father   . Hypertension Brother   . Skin cancer Brother   . Colon cancer Neg Hx    Social History   Socioeconomic History  . Marital status: Married    Spouse name: Not on file  . Number of children: 2  . Years of education: Not on file  . Highest education level: Not on file  Occupational History  . Occupation: owner    Comment: [employer]  . Financial resource strain: Not on file  . Food insecurity:    Worry: Not on file    Inability: Not on file  . Transportation needs:    Medical: Not on file    Non-medical: Not on file  Tobacco Use  . Smoking status: Former Smoker    Packs/day: 4.00    Years: 20.00    Pack years: 80.00    Types: Cigarettes    Start date: 07/19/1973    Last attempt to quit: 12/13/1991    Years since quitting: 26.6  . Smokeless tobacco: Never Used  Substance and Sexual Activity  . Alcohol use: Yes    Alcohol/week: 3.0 - 5.0 standard drinks    Types: 3 - 5 Glasses of wine per week    Comment: takes 3 to 5 glasses of wine nightly   . Drug use: No  . Sexual activity: Not on file  Lifestyle  . Physical activity:    Days per week: Not on file    Minutes per session: Not on file  . Stress: Not on file  Relationships  . Social connections:    Talks on phone: Not on file    Gets together: Not on file    Attends religious service: Not on file    Active member of club or organization: Not on file    Attends meetings of clubs or organizations: Not on file     Relationship status: Not on file  Other Topics Concern  . Not on file  Social History Narrative  . Not on file   Depression screen [facility] 2/9 08/15/2018 04/30/2018 10/01/2017 03/14/2017 11/17/2016  Decreased Interest 0 0 0 0 0  Down, Depressed, Hopeless 0 0 0 0 0  PHQ - 2 Score 0 0 0 0 0  Altered sleeping - - - - 1  Tired, decreased energy - - - - 1  Change in appetite - - - - 1  Feeling bad or failure about yourself  - - - - 0  Trouble concentrating - - - - 0  Moving slowly or fidgety/restless - - - - 0  Suicidal thoughts - - - - 0  PHQ-9 Score - - - - 3    ROS As noted in HPI  Objective:  BP 136/87   Pulse 71   Temp 98.2 F (36.8 C) (Oral)   Resp 18   Ht 5' 11.85" (1.825 m)   Wt 292 lb 3.2 oz (132.5 kg)   SpO2 96%   BMI 39.80 kg/m  Physical Exam Constitutional:      General: He is not in acute distress.    Appearance: He is well-developed. He is not diaphoretic.  HENT:     Head: Normocephalic and atraumatic.  Eyes:     General: No scleral icterus.    Conjunctiva/sclera: Conjunctivae normal.     Pupils: Pupils are equal, round, and reactive to light.  Neck:     Musculoskeletal: Normal range of motion and neck supple.  Thyroid: No thyromegaly.  Cardiovascular:     Rate and Rhythm: Normal rate and regular rhythm.     Heart sounds: Normal heart sounds.  Pulmonary:     Effort: Pulmonary effort is normal. No respiratory distress.     Breath sounds: Normal breath sounds.  Lymphadenopathy:     Cervical: No cervical adenopathy.  Skin:    General: Skin is warm and dry.  Neurological:     Mental Status: He is alert and oriented to person, place, and time.  Psychiatric:        Behavior: Behavior normal.     [facility] TESTING Office Visit on 08/15/2018  Component Date Value Ref Range Status  . Color, UA 08/15/2018 yellow  yellow Final  . Clarity, UA 08/15/2018 clear  clear Final  . Glucose, UA 08/15/2018 negative  negative mg/dL Final  . Bilirubin, UA 08/15/2018  negative  negative Final  . Ketones, POC UA 08/15/2018 negative  negative mg/dL Final  . Spec Grav, UA 08/15/2018 1.015  1.010 - 1.025 Final  . Blood, UA 08/15/2018 negative  negative Final  . pH, UA 08/15/2018 7.0  5.0 - 8.0 Final  . Protein Ur, POC 08/15/2018 negative  negative mg/dL Final  . Urobilinogen, UA 08/15/2018 0.2  0.2 or 1.0 E.U./dL Final  . Nitrite, UA 08/15/2018 Negative  Negative Final  . Leukocytes, UA 08/15/2018 Negative  Negative Final  . POC Glucose 08/15/2018 103* 70 - 99 mg/dl Final  . Hemoglobin A1C 08/15/2018 7.1* 4.0 - 5.6 % Final    Lab Results  Component Value Date   HGBA1C 7.1 (A) 08/15/2018   HGBA1C 7.1 (A) 04/30/2018   HGBA1C 7.8 (H) 10/01/2017   HGBA1C 7.1 06/01/2017   HGBA1C 7.0 03/14/2017   HGBA1C 7.2 11/17/2016     Assessment & Plan:   1. Type 2 diabetes mellitus with other circulatory complication, with long-term current use of insulin (HCC) - cont tresiba - overall well controlled  2. Essential hypertension - cont current regimen BP Readings from Last 3 Encounters:  08/15/18 136/87  04/30/18 124/80  10/01/17 135/81     3. Mixed hyperlipidemia - at goal - cont crestor 20 Lab Results  Component Value Date   LDLCALC 54 08/15/2018     4. Class 2 severe obesity due to excess calories with serious comorbidity and body mass index (BMI) of 39.0 to 39.9 in adult ([place])   5. Diarrhea, unspecified type - following with GI, improving, IBS-D s/p food-borne GI illness > a yr ago  83. Lower urinary tract symptoms (LUTS) - has not tried flomax yet but might  7.      Insomnia, unspecified type - could not tolerate doxepin   Patient will continue on current chronic medications other than changes noted above, so ok to refill when needed.   See after visit summary for patient specific instructions.  Orders Placed This Encounter  Procedures  . CMP14+EGFR  . CBC with Differential/Platelet  . Lipid panel  . POCT urinalysis dipstick  . POCT glucose  (manual entry)  . POCT glycosylated hemoglobin (Hb A1C)    Meds ordered this encounter  Medications  . azithromycin (ZITHROMAX) 250 MG tablet    Sig: as needed.    Dispense:  6 each    Refill:  0  . Insulin Degludec (TRESIBA FLEXTOUCH) 200 UNIT/ML SOPN    Sig: Inject 80 Units into the skin daily.    Dispense:  45 mL    Refill:  1  . CARTIA  XT 240 MG 24 hr capsule    Sig: Take 1 capsule (240 mg total) by mouth daily.    Dispense:  90 capsule    Refill:  1  . furosemide (LASIX) 20 MG tablet    Sig: Take 1 tablet (20 mg total) by mouth daily.    Dispense:  90 tablet    Refill:  1  . lisinopril-hydrochlorothiazide (PRINZIDE,ZESTORETIC) 20-12.5 MG tablet    Sig: Take 2 tablets by mouth every morning.    Dispense:  180 tablet    Refill:  1  . metoprolol succinate (TOPROL-XL) 100 MG 24 hr tablet    Sig: TAKE 1 TABLET BY MOUTH DAILY WITH OR IMMEDIATELY FOLLOWING A MEAL    Dispense:  90 tablet    Refill:  2  . potassium chloride SA (K-DUR,KLOR-CON) 20 MEQ tablet    Sig: Take 1 tablet (20 mEq total) by mouth daily.    Dispense:  90 tablet    Refill:  1  . rosuvastatin (CRESTOR) 20 MG tablet    Sig: Take 1 tablet (20 mg total) by mouth daily.    Dispense:  90 tablet    Refill:  1  . tamsulosin (FLOMAX) 0.4 MG CAPS capsule    Sig: Take 1 capsule (0.4 mg total) by mouth daily after supper.    Dispense:  90 capsule    Refill:  0    Pt does not need currently. Please add this on as refills to current rx.  . Insulin Pen Needle (B-D UF III MINI PEN NEEDLES) 31G X 5 MM MISC    Sig: USE AS DIRECTED ONCE A DAY    Dispense:  100 each    Refill:  11    Patient verbalized to me that they understand the following: diagnosis, what is being done for them, what to expect and what should be done at home.  Their questions have been answered. They understand that I am unable to predict every possible medication interaction or adverse outcome and that if any unexpected symptoms arise, they should  contact us and their pharmacist, as well as never hesitate to seek urgent/emergent care at [facility] or ER if they think it might be warranted.    [name], MD, MPH [facility] [address] [place], [place]  [address] [phone] Office phone  [phone] Office fax  08/15/18 9:57 AM

## 2018-08-16 LAB — CMP14+EGFR
ALT: 59 IU/L — ABNORMAL HIGH (ref 0–44)
AST: 79 IU/L — ABNORMAL HIGH (ref 0–40)
Albumin/Globulin Ratio: 1.8 (ref 1.2–2.2)
Albumin: 4.4 g/dL (ref 3.8–4.8)
Alkaline Phosphatase: 65 IU/L (ref 39–117)
BUN/Creatinine Ratio: 10 (ref 10–24)
BUN: 12 mg/dL (ref 8–27)
Bilirubin Total: 0.6 mg/dL (ref 0.0–1.2)
CO2: 29 mmol/L (ref 20–29)
Calcium: 9.6 mg/dL (ref 8.6–10.2)
Chloride: 92 mmol/L — ABNORMAL LOW (ref 96–106)
Creatinine, Ser: 1.18 mg/dL (ref 0.76–1.27)
GFR calc Af Amer: 75 mL/min/{1.73_m2} (ref >59)
GFR calc non Af Amer: 65 mL/min/{1.73_m2} (ref >59)
Globulin, Total: 2.5 g/dL (ref 1.5–4.5)
Glucose: 114 mg/dL — ABNORMAL HIGH (ref 65–99)
Potassium: 3.5 mmol/L (ref 3.5–5.2)
Sodium: 137 mmol/L (ref 134–144)
Total Protein: 6.9 g/dL (ref 6.0–8.5)

## 2018-08-16 LAB — CBC WITH DIFFERENTIAL/PLATELET
Basophils Absolute: 0 10*3/uL (ref 0.0–0.2)
Basos: 0 %
EOS (ABSOLUTE): 0.1 10*3/uL (ref 0.0–0.4)
Eos: 2 %
Hematocrit: 46.7 % (ref 37.5–51.0)
Hemoglobin: 16.5 g/dL (ref 13.0–17.7)
Immature Grans (Abs): 0 10*3/uL (ref 0.0–0.1)
Immature Granulocytes: 0 %
Lymphocytes Absolute: 1.8 10*3/uL (ref 0.7–3.1)
Lymphs: 30 %
MCH: 33.2 pg — ABNORMAL HIGH (ref 26.6–33.0)
MCHC: 35.3 g/dL (ref 31.5–35.7)
MCV: 94 fL (ref 79–97)
Monocytes Absolute: 0.5 10*3/uL (ref 0.1–0.9)
Monocytes: 7 %
Neutrophils Absolute: 3.8 10*3/uL (ref 1.4–7.0)
Neutrophils: 61 %
Platelets: 219 10*3/uL (ref 150–450)
RBC: 4.97 x10E6/uL (ref 4.14–5.80)
RDW: 12.2 % (ref 11.6–15.4)
WBC: 6.2 10*3/uL (ref 3.4–10.8)

## 2018-08-16 LAB — LIPID PANEL
Chol/HDL Ratio: 2.7 ratio (ref 0.0–5.0)
Cholesterol, Total: 147 mg/dL (ref 100–199)
HDL: 54 mg/dL (ref >39)
LDL Calculated: 54 mg/dL (ref 0–99)
Triglycerides: 194 mg/dL — ABNORMAL HIGH (ref 0–149)
VLDL Cholesterol Cal: 39 mg/dL (ref 5–40)

## 2018-08-23 ENCOUNTER — Encounter: Payer: Self-pay | Admitting: Family Medicine

## 2018-11-25 ENCOUNTER — Other Ambulatory Visit: Payer: Self-pay

## 2018-11-25 ENCOUNTER — Telehealth: Payer: Self-pay | Admitting: Family Medicine

## 2018-11-25 DIAGNOSIS — E1159 Type 2 diabetes mellitus with other circulatory complications: Secondary | ICD-10-CM

## 2018-11-25 DIAGNOSIS — Z794 Long term (current) use of insulin: Secondary | ICD-10-CM

## 2018-11-25 MED ORDER — INSULIN DEGLUDEC 200 UNIT/ML ~~LOC~~ SOPN: 80.0000 [IU] | PEN_INJECTOR | Freq: Every day | SUBCUTANEOUS | 1 refills | Status: DC

## 2018-11-25 NOTE — Telephone Encounter (Signed)
Copied from [place] [phone]. Topic: General - Other >> Nov 24, 2018  6:34 PM Valla Leaver wrote: Reason for CRM: Patient needs Insulin Degludec (TRESIBA FLEXTOUCH) 200 UNIT/ML SOPN sent to [facility] [address], [place] [address] Phone: [phone] Fax: [phone]  But does not have PCP currently and needs TOC appt.

## 2018-11-25 NOTE — Telephone Encounter (Signed)
Rx sent to pharmacy and has been informed to make an appointment

## 2019-03-17 ENCOUNTER — Ambulatory Visit
Admission: RE | Admit: 2019-03-17 | Discharge: 2019-03-17 | Disposition: A | Discharge status: Home / Self Care | Payer: BLUE CROSS/BLUE SHIELD | Source: Ambulatory Visit | Attending: Family Medicine | Admitting: Family Medicine

## 2019-03-17 ENCOUNTER — Other Ambulatory Visit: Payer: Self-pay | Admitting: Family Medicine

## 2019-03-17 ENCOUNTER — Other Ambulatory Visit: Payer: Self-pay

## 2019-03-17 ENCOUNTER — Other Ambulatory Visit: Payer: Self-pay | Admitting: Pediatrics

## 2019-03-17 DIAGNOSIS — R1032 Left lower quadrant pain: Secondary | ICD-10-CM

## 2019-04-09 ENCOUNTER — Telehealth: Payer: Self-pay

## 2019-04-09 NOTE — Telephone Encounter (Signed)
NOTES ON FILE FROM [facility] [phone] SENT REFERRAL TO [facility]

## 2019-04-10 ENCOUNTER — Other Ambulatory Visit: Payer: Self-pay | Admitting: Family Medicine

## 2019-04-13 ENCOUNTER — Other Ambulatory Visit: Payer: Self-pay | Admitting: Family Medicine

## 2019-04-14 ENCOUNTER — Other Ambulatory Visit (HOSPITAL_COMMUNITY): Payer: Self-pay | Admitting: Family Medicine

## 2019-04-14 DIAGNOSIS — I714 Abdominal aortic aneurysm, without rupture, unspecified: Secondary | ICD-10-CM

## 2019-04-17 ENCOUNTER — Other Ambulatory Visit: Payer: Self-pay

## 2019-04-17 ENCOUNTER — Ambulatory Visit (HOSPITAL_COMMUNITY)
Admission: RE | Admit: 2019-04-17 | Discharge: 2019-04-17 | Disposition: A | Discharge status: Home / Self Care | Payer: BLUE CROSS/BLUE SHIELD | Source: Ambulatory Visit | Attending: Cardiovascular Disease | Admitting: Cardiovascular Disease

## 2019-04-17 DIAGNOSIS — I714 Abdominal aortic aneurysm, without rupture, unspecified: Secondary | ICD-10-CM

## 2019-09-10 ENCOUNTER — Ambulatory Visit: Payer: BLUE CROSS/BLUE SHIELD | Attending: Internal Medicine

## 2019-09-10 DIAGNOSIS — Z23 Encounter for immunization: Secondary | ICD-10-CM

## 2019-09-10 NOTE — Progress Notes (Signed)
   Covid-19 Vaccination Clinic  Name:  [name]    MRN: [id number] DOB: 09-Apr-1956  09/10/2019  [name] was observed post Covid-19 immunization for 15 minutes without incident. He was provided with Vaccine Information Sheet and instruction to access the V-Safe system.   [name] was instructed to call 911 with any severe reactions post vaccine: Marland Kitchen Difficulty breathing  . Swelling of face and throat  . A fast heartbeat  . A bad rash all over body  . Dizziness and weakness   Immunizations Administered    Name Date Dose VIS Date Route   Pfizer COVID-19 Vaccine 09/10/2019 10:53 AM 0.3 mL 05/29/2019 Intramuscular   Manufacturer: [place]   Lot: [id number]   [place]: [id number]

## 2019-10-05 ENCOUNTER — Ambulatory Visit: Payer: BLUE CROSS/BLUE SHIELD | Attending: Internal Medicine

## 2019-10-05 DIAGNOSIS — Z23 Encounter for immunization: Secondary | ICD-10-CM

## 2019-10-05 NOTE — Progress Notes (Signed)
   Covid-19 Vaccination Clinic  Name:  WYAT KOSICK    MRN: [id number] DOB: 06/29/1955  10/05/2019  Mr. Dupler was observed post Covid-19 immunization for 15 minutes without incident. He was provided with Vaccine Information Sheet and instruction to access the V-Safe system.   Mr. Filmore was instructed to call 911 with any severe reactions post vaccine: Marland Kitchen Difficulty breathing  . Swelling of face and throat  . A fast heartbeat  . A bad rash all over body  . Dizziness and weakness   Immunizations Administered    Name Date Dose VIS Date Route   Pfizer COVID-19 Vaccine 10/05/2019 10:28 AM 0.3 mL 08/12/2018 Intramuscular   Manufacturer: [place]   Lot: [id number]   [place]: [id number]

## 2020-02-08 NOTE — Telephone Encounter (Signed)
Old messsage. Will close

## 2020-07-28 ENCOUNTER — Other Ambulatory Visit: Payer: Self-pay

## 2020-07-28 ENCOUNTER — Ambulatory Visit (INDEPENDENT_AMBULATORY_CARE_PROVIDER_SITE_OTHER): Payer: Medicare Other

## 2020-07-28 ENCOUNTER — Ambulatory Visit (INDEPENDENT_AMBULATORY_CARE_PROVIDER_SITE_OTHER): Payer: Medicare Other | Admitting: Podiatry

## 2020-07-28 DIAGNOSIS — L97519 Non-pressure chronic ulcer of other part of right foot with unspecified severity: Secondary | ICD-10-CM

## 2020-07-28 DIAGNOSIS — S9032XA Contusion of left foot, initial encounter: Secondary | ICD-10-CM | POA: Diagnosis not present

## 2020-07-28 DIAGNOSIS — L97512 Non-pressure chronic ulcer of other part of right foot with fat layer exposed: Secondary | ICD-10-CM | POA: Diagnosis not present

## 2020-07-28 DIAGNOSIS — E11621 Type 2 diabetes mellitus with foot ulcer: Secondary | ICD-10-CM

## 2020-07-28 DIAGNOSIS — E1149 Type 2 diabetes mellitus with other diabetic neurological complication: Secondary | ICD-10-CM

## 2020-07-28 MED ORDER — DOXYCYCLINE HYCLATE 100 MG PO TABS: 100.0000 mg | ORAL_TABLET | Freq: Two times a day (BID) | ORAL | 0 refills | Status: DC

## 2020-07-28 NOTE — Patient Instructions (Signed)
Clean wound with soap and water and dry well. Apply a small amount of "medihoney" to the wound (this is what I gave you) and then apply a bandage over the wound. Try to stay off of the foot as much as possible. Watch for any increase in swelling, redness, drainage or any fevers/chills. If any issues, please let me know  If was nice to meet you today. If you have any questions or any further concerns, please feel fee to give me a call. You can call our office at [phone] or please feel fee to send me a message through [facility].

## 2020-07-30 NOTE — Progress Notes (Signed)
Subjective:   Patient ID: [name], male   DOB: 66 y.o.   MRN: [id number]   HPI 65 year old male presents the office today for concerns of a preulcerative callus, wound on the right big toe.  He states that he was in [place] when he noticed redness and swelling to the toe he was seen in urgent care he was prescribed clindamycin on June 23, 2020.  This did help but it upset his stomach.  Currently denies any drainage or pus coming from the toe.  He states that the calluses are constantly come off on their own but this was becoming thick and he is noted some dried blood.  Also has a bruise on the left big toe but denies any injury.  No open lesions otherwise.  He is diabetic his last A1c he reports was greater than 8, which is higher than normal.   Review of Systems  All other systems reviewed and are negative.  Past Medical History:  Diagnosis Date  . Abdominal aortic aneurysm (AAA), 30-34 mm diameter (HCC) 06/01/2017   Nml w/ greatest dm 3 cm US Aorta 06/2016, Abd [place] 05/01/2017 showed infrarenal dilatation at 3.4 cm - rec repeat US in 3 yrs.  . Arthritis   . Cervical disc disease 12/26/2011  . Coronary artery disease involving native coronary artery of native heart without angina pectoris 12/01/2016   He had coronary angiography in 2011. There was irregularity within the LAD with up to 30% narrowing. The myocardial perfusion imaging done 11/07/2015 did not demonstrate any evidence of ischemia with a low risk nuclear stress test other than EF estimated at 47%. Chest CT 01/25/2016 showed diffuse coronary artery calcifications with heavy calcifications in the LAD. Pt seen by cardiology Dr. Linard Millers   . Diabetes mellitus   . Difficult intubation 04/05/2014  . Dyslipidemia   . Family history of anesthesia complication    pt states took days for his father to awaken after mask was used   . Fatty liver 06/01/2017   [place] 05/01/2017  . GERD (gastroesophageal reflux disease)    has been  having acid reflux since recent endoscopy   . H/O hiatal hernia    repair with lap band, now has ventral hernia midline abd  . Hearing loss-aides 03/26/2012  . Hyperlipidemia   . Hypertension   . Morbid obesity ([place])   . OSA (obstructive sleep apnea)   . Rotator cuff tear   . Seasonal allergies   . Shortness of breath    pt states related to high BP meds with extended walking or climbling stairs  . Sleep apnea     Past Surgical History:  Procedure Laterality Date  . CARDIAC CATHETERIZATION     approx 3 years ago   . COLONOSCOPY WITH PROPOFOL N/A 03/10/2015   Procedure: COLONOSCOPY WITH PROPOFOL;  Surgeon: Milus Banister, MD;  Location: [facility];  Service: Endoscopy;  Laterality: N/A;  . colonscopy      2012  . ESOPHAGOGASTRODUODENOSCOPY (EGD) WITH PROPOFOL N/A 03/10/2015   Procedure: ESOPHAGOGASTRODUODENOSCOPY (EGD) WITH PROPOFOL;  Surgeon: Milus Banister, MD;  Location: [facility];  Service: Endoscopy;  Laterality: N/A;  . HERNIA REPAIR     with gastric banding  . LAPAROSCOPIC GASTRIC BANDING N/A 04/05/2014   Procedure: LAPAROSCOPIC GASTRIC BANDING;  Surgeon: Pedro Earls, MD;  Location: [facility];  Service: General;  Laterality: N/A;  . MENISCUS REPAIR Left    left knee torn meniscus  . VASECTOMY  Current Outpatient Medications:  .  diltiazem (TIAZAC) 240 MG 24 hr capsule, Take by mouth., Disp: , Rfl:  .  doxycycline (VIBRA-TABS) 100 MG tablet, Take 1 tablet (100 mg total) by mouth 2 (two) times daily., Disp: 20 tablet, Rfl: 0 .  aspirin 81 MG tablet, Take 81 mg by mouth every morning. , Disp: , Rfl:  .  azithromycin (ZITHROMAX) 250 MG tablet, as needed., Disp: 6 each, Rfl: 0 .  CARTIA XT 240 MG 24 hr capsule, Take 1 capsule (240 mg total) by mouth daily., Disp: 90 capsule, Rfl: 1 .  fluticasone (FLONASE) 50 MCG/ACT nasal spray, Place 2 sprays into both nostrils daily., Disp: 16 g, Rfl: 2 .  furosemide (LASIX) 20 MG tablet, Take 1 tablet (20 mg total) by mouth  daily., Disp: 90 tablet, Rfl: 1 .  glucose blood (TRUETEST TEST) test strip, Use as instructed, Disp: 100 each, Rfl: 2 .  ibuprofen (ADVIL,MOTRIN) 200 MG tablet, Take 400-600 mg by mouth once as needed for headache or mild pain., Disp: , Rfl:  .  Insulin Degludec (TRESIBA FLEXTOUCH) 200 UNIT/ML SOPN, Inject 80 Units into the skin daily. Need Appointment before any more refill, Disp: 45 mL, Rfl: 1 .  Insulin Pen Needle (B-D UF III MINI PEN NEEDLES) 31G X 5 MM MISC, USE AS DIRECTED ONCE A DAY, Disp: 100 each, Rfl: 11 .  ketoconazole (NIZORAL) 2 % cream, Apply 1 application topically 2 (two) times daily., Disp: 60 g, Rfl: 2 .  lisinopril-hydrochlorothiazide (PRINZIDE,ZESTORETIC) 20-12.5 MG tablet, Take 2 tablets by mouth every morning., Disp: 180 tablet, Rfl: 1 .  metoprolol succinate (TOPROL-XL) 100 MG 24 hr tablet, TAKE 1 TABLET BY MOUTH DAILY WITH OR IMMEDIATELY FOLLOWING A MEAL, Disp: 90 tablet, Rfl: 2 .  metoprolol tartrate (LOPRESSOR) 100 MG tablet, Take 100 mg by mouth daily., Disp: , Rfl:  .  Multiple Vitamin (MULTIVITAMIN) tablet, Take 1 tablet by mouth every morning. , Disp: , Rfl:  .  nystatin (MYCOSTATIN/NYSTOP) powder, Apply topically 4 (four) times daily., Disp: 60 g, Rfl: 1 .  Potassium Chloride ER 20 MEQ TBCR, Take 1 tablet by mouth daily., Disp: , Rfl:  .  potassium chloride SA (K-DUR,KLOR-CON) 20 MEQ tablet, Take 1 tablet (20 mEq total) by mouth daily., Disp: 90 tablet, Rfl: 1 .  rosuvastatin (CRESTOR) 20 MG tablet, Take 1 tablet (20 mg total) by mouth daily., Disp: 90 tablet, Rfl: 1 .  tamsulosin (FLOMAX) 0.4 MG CAPS capsule, Take 1 capsule (0.4 mg total) by mouth daily after supper., Disp: 90 capsule, Rfl: 0  Allergies  Allergen Reactions  . Erythromycin Nausea Only  . Penicillins Nausea Only    Upset stomach  . Quercus Robur Itching         Objective:  Physical Exam  General: AAO x3, NAD  Dermatological: Thick hyperkeratotic lesion plantar aspect the right hallux with  dried blood.  Upon debridement superficial granular wound is present.  There is localized edema and faint erythema to the toe.  There is no ascending cellulitis there is no drainage or pus identified there is no fluctuation crepitation but there is no malodor.  Slight bruising to the dorsal left hallux without any skin breakdown.  Vascular: Dorsalis Pedis artery and Posterior Tibial artery pedal pulses are 2/4 bilateral with immedate capillary fill time. There is no pain with calf compression, swelling, warmth, erythema.   Neruologic: Sensation decreased with Semmes Weinstein monofilament  Musculoskeletal: No gross boney pedal deformities bilateral. No pain, crepitus, or limitation  noted with foot and ankle range of motion bilateral. Muscular strength 5/5 in all groups tested bilateral.  Gait: Unassisted, Nonantalgic.      Assessment:   Ulceration right hallux with mild cellulitis; contusion left hallux     Plan:  -Treatment options discussed including all alternatives, risks, and complications -Etiology of symptoms were discussed -X-rays were obtained and reviewed with the patient.  There is no evidence of acute fracture or stress fracture.  No definitive evidence of osteomyelitis identified today. -Today I sharply debrided the hyperkeratotic lesion on the right hallux without any complications to reveal the underlying ulceration utilizing a #312 with scalpel to remove nonviable devitalized tissue to promote wound healing. Medihoney was applied also gave him this to use at home.  Continue offloading.  Prescribed doxycycline. Glucose control.  -Monitor left hallux but no pain or skin breakdown. -Monitor for any clinical signs or symptoms of infection and directed to call the office immediately should any occur or go to the ER.  Trula Slade DPM

## 2020-08-05 ENCOUNTER — Ambulatory Visit (INDEPENDENT_AMBULATORY_CARE_PROVIDER_SITE_OTHER): Payer: Medicare Other | Admitting: Podiatry

## 2020-08-05 ENCOUNTER — Other Ambulatory Visit: Payer: Self-pay

## 2020-08-05 ENCOUNTER — Telehealth: Payer: Self-pay | Admitting: Podiatry

## 2020-08-05 DIAGNOSIS — L97519 Non-pressure chronic ulcer of other part of right foot with unspecified severity: Secondary | ICD-10-CM

## 2020-08-05 DIAGNOSIS — L03031 Cellulitis of right toe: Secondary | ICD-10-CM | POA: Diagnosis not present

## 2020-08-05 DIAGNOSIS — E11621 Type 2 diabetes mellitus with foot ulcer: Secondary | ICD-10-CM

## 2020-08-05 MED ORDER — DOXYCYCLINE HYCLATE 100 MG PO TABS: 100.0000 mg | ORAL_TABLET | Freq: Two times a day (BID) | ORAL | 0 refills | Status: DC

## 2020-08-05 NOTE — Telephone Encounter (Signed)
If nobody can see him in [place] then I will be at the office around 1:30 and can see him.

## 2020-08-05 NOTE — Telephone Encounter (Signed)
Patient calling to inform Jacqualyn Posey that the previously carved callus is now showing signs of irritation and infection. Patient is diabetic and needs to be seen today. Will possibly be seen by another provider in [place].

## 2020-08-05 NOTE — Telephone Encounter (Signed)
That is fine.   Thank you

## 2020-08-05 NOTE — Telephone Encounter (Signed)
I attempted to call patient. Left VM

## 2020-08-05 NOTE — Telephone Encounter (Signed)
Patient is scheduled with [facility] today. Is that ok?

## 2020-08-08 ENCOUNTER — Other Ambulatory Visit: Payer: Self-pay

## 2020-08-08 ENCOUNTER — Ambulatory Visit
Admission: RE | Admit: 2020-08-08 | Discharge: 2020-08-08 | Disposition: A | Discharge status: Home / Self Care | Payer: Medicare Other | Source: Ambulatory Visit | Attending: Family Medicine | Admitting: Family Medicine

## 2020-08-08 ENCOUNTER — Other Ambulatory Visit: Payer: Self-pay | Admitting: Family Medicine

## 2020-08-08 DIAGNOSIS — M25552 Pain in left hip: Secondary | ICD-10-CM

## 2020-08-08 DIAGNOSIS — M545 Low back pain, unspecified: Secondary | ICD-10-CM

## 2020-08-08 DIAGNOSIS — M25551 Pain in right hip: Secondary | ICD-10-CM

## 2020-08-11 ENCOUNTER — Ambulatory Visit: Payer: Medicare Other | Admitting: Podiatry

## 2020-08-15 NOTE — Progress Notes (Signed)
Subjective:   Patient ID: [name], male   DOB: 65 y.o.   MRN: [id number]   HPI Saw Dr. Jacqualyn Posey 03/27/2001 for concerns of a sore to the right great toe states that it was getting warm and red and he was was getting concerned  Review of Systems  All other systems reviewed and are negative.  Objective:  Physical Exam  General: AAO x3, NAD  Dermatological: Thick hyperkeratotic lesion plantar aspect the right hallux with dried blood.  Healing ulceration about the right great toe with some warmth and erythema no probe to bone  Vascular: Dorsalis Pedis artery and Posterior Tibial artery pedal pulses are 2/4 bilateral with immedate capillary fill time. There is no pain with calf compression, swelling, warmth, erythema.   Neruologic: Sensation decreased with Semmes Weinstein monofilament  Musculoskeletal: No gross boney pedal deformities bilateral. No pain, crepitus, or limitation noted with foot and ankle range of motion bilateral. Muscular strength 5/5 in all groups tested bilateral.  Gait: Unassisted, Nonantalgic.      Assessment:   Diabetic ulcer of right great toe ([place]) [M62.947, L97.519]    Plan:  -The ulceration does appear much better. -Rx doxycycline for slight redness -Dressed with povidone and Band-Aid  Evelina Bucy DPM

## 2020-08-23 ENCOUNTER — Other Ambulatory Visit: Payer: Self-pay

## 2020-08-23 ENCOUNTER — Ambulatory Visit (INDEPENDENT_AMBULATORY_CARE_PROVIDER_SITE_OTHER): Payer: Medicare Other | Admitting: Podiatry

## 2020-08-23 DIAGNOSIS — L97519 Non-pressure chronic ulcer of other part of right foot with unspecified severity: Secondary | ICD-10-CM | POA: Diagnosis not present

## 2020-08-23 DIAGNOSIS — E11621 Type 2 diabetes mellitus with foot ulcer: Secondary | ICD-10-CM | POA: Diagnosis not present

## 2020-08-29 ENCOUNTER — Telehealth: Payer: Self-pay | Admitting: *Deleted

## 2020-08-29 NOTE — Telephone Encounter (Signed)
We can do a 3 month sticker - he can come into the office and they can fill it out for him

## 2020-08-29 NOTE — Telephone Encounter (Signed)
Patient is requesting a handicapp sticker for his travels to [place]. Please advise.

## 2020-08-31 ENCOUNTER — Telehealth: Payer: Self-pay | Admitting: *Deleted

## 2020-08-31 NOTE — Telephone Encounter (Signed)
Called and explained  to patient that his handicapp sticker request has been approved for 3 months and he may come into the office to fill out and pick up.

## 2020-08-31 NOTE — Telephone Encounter (Signed)
handicapp sticker has been approved by Dr March Rummage.

## 2020-09-14 ENCOUNTER — Encounter: Payer: Self-pay | Admitting: Podiatry

## 2020-09-15 MED ORDER — DOXYCYCLINE HYCLATE 100 MG PO TABS: 100.0000 mg | ORAL_TABLET | Freq: Two times a day (BID) | ORAL | 0 refills | Status: DC

## 2020-09-30 NOTE — Progress Notes (Signed)
  Subjective:  Patient ID: [name], male    DOB: [birth year],  MRN: [id number]  Chief Complaint  Patient presents with  . Wound Check    Follow up wound check of right great toe. Pt. States healing has been good.     65 y.o. male presents for wound care. Hx confirmed with patient.  Requesting that the wound not be changed to D5.  Last incidence of Objective:  Physical Exam: Wound Location: right great toe Wound Measurement: 0.3x0.4 Wound Base: Granular/Healthy Peri-wound: Normal Exudate: Scant/small amount Serosanguinous exudate wound without warmth, erythema, signs of acute infection  Assessment:   1. Diabetic ulcer of right great toe [place])      Plan:  Patient was evaluated and treated and all questions answered.  Ulcer right great toe -Improved -No signs of acute infection today -Dressed with povidone and Band-Aid   No follow-ups on file.

## 2020-10-07 ENCOUNTER — Other Ambulatory Visit: Payer: Self-pay

## 2020-10-07 ENCOUNTER — Ambulatory Visit (INDEPENDENT_AMBULATORY_CARE_PROVIDER_SITE_OTHER): Payer: Medicare Other | Admitting: Podiatry

## 2020-10-07 ENCOUNTER — Encounter: Payer: Self-pay | Admitting: Internal Medicine

## 2020-10-07 DIAGNOSIS — L97519 Non-pressure chronic ulcer of other part of right foot with unspecified severity: Secondary | ICD-10-CM

## 2020-10-07 DIAGNOSIS — E11621 Type 2 diabetes mellitus with foot ulcer: Secondary | ICD-10-CM

## 2020-10-11 ENCOUNTER — Telehealth: Payer: Self-pay

## 2020-10-11 NOTE — Telephone Encounter (Signed)
NOTES FROM [facility] [phone], SENT REFERRAL TO SCHEDULING

## 2020-10-20 NOTE — Progress Notes (Signed)
  Subjective:  Patient ID: [name], male    DOB: 02-28-1956,  MRN: [id number]  Chief Complaint  Patient presents with  . Toe Pain      right foot great toe pain-bleeding    65 y.o. male presents for wound care. Hx confirmed with patient.  States that is doing a lot better not red anymore but is still hurting and bleeding Objective:  Physical Exam: Wound Location: right great toe Wound Measurement: 0.2x.2 Wound Base: Granular/Healthy Peri-wound: Normal Exudate: None: wound tissue dry wound without warmth, erythema, signs of acute infection Assessment:   1. Diabetic ulcer of right great toe [facility])      Plan:  Patient was evaluated and treated and all questions answered.  Ulcer right great toe -Improving -No further antibiotics indicated at this time -Discussed offloading strategies   No follow-ups on file.

## 2020-10-21 ENCOUNTER — Ambulatory Visit (INDEPENDENT_AMBULATORY_CARE_PROVIDER_SITE_OTHER): Payer: Medicare Other | Admitting: Internal Medicine

## 2020-10-21 ENCOUNTER — Encounter: Payer: Self-pay | Admitting: Internal Medicine

## 2020-10-21 ENCOUNTER — Other Ambulatory Visit: Payer: Self-pay

## 2020-10-21 VITALS — BP 120/60 | HR 55 | Ht 72.0 in | Wt 303.0 lb

## 2020-10-21 DIAGNOSIS — I714 Abdominal aortic aneurysm, without rupture, unspecified: Secondary | ICD-10-CM

## 2020-10-21 DIAGNOSIS — E1165 Type 2 diabetes mellitus with hyperglycemia: Secondary | ICD-10-CM

## 2020-10-21 DIAGNOSIS — I2510 Atherosclerotic heart disease of native coronary artery without angina pectoris: Secondary | ICD-10-CM | POA: Diagnosis not present

## 2020-10-21 DIAGNOSIS — Z6841 Body Mass Index (BMI) 40.0 and over, adult: Secondary | ICD-10-CM

## 2020-10-21 DIAGNOSIS — E1129 Type 2 diabetes mellitus with other diabetic kidney complication: Secondary | ICD-10-CM

## 2020-10-21 DIAGNOSIS — R0609 Other forms of dyspnea: Secondary | ICD-10-CM

## 2020-10-21 DIAGNOSIS — G4733 Obstructive sleep apnea (adult) (pediatric): Secondary | ICD-10-CM

## 2020-10-21 DIAGNOSIS — R0600 Dyspnea, unspecified: Secondary | ICD-10-CM

## 2020-10-21 DIAGNOSIS — I10 Essential (primary) hypertension: Secondary | ICD-10-CM

## 2020-10-21 DIAGNOSIS — I700 Atherosclerosis of aorta: Secondary | ICD-10-CM

## 2020-10-21 DIAGNOSIS — IMO0002 Reserved for concepts with insufficient information to code with codable children: Secondary | ICD-10-CM

## 2020-10-21 MED ORDER — FUROSEMIDE 40 MG PO TABS: 40.0000 mg | ORAL_TABLET | Freq: Every day | ORAL | 3 refills | Status: DC

## 2020-10-21 NOTE — Patient Instructions (Signed)
Medication Instructions:  Your physician has recommended you make the following change in your medication:  INCREASE: furosemide (Lasix) to 40 mg by mouth daily  *If you need a refill on your cardiac medications before your next appointment, please call your pharmacy*   Lab Work: TODAY: BNP In 1 week: BMP If you have labs (blood work) drawn today and your tests are completely normal, you will receive your results only by: Marland Kitchen MyChart Message (if you have MyChart) OR . A paper copy in the mail If you have any lab test that is abnormal or we need to change your treatment, we will call you to review the results.   Testing/Procedures: NONE   Follow-Up: At [facility], you and your health needs are our priority.  As part of our continuing mission to provide you with exceptional heart care, we have created designated Provider Care Teams.  These Care Teams include your primary Cardiologist (physician) and Advanced Practice Providers (APPs -  Physician Assistants and Nurse Practitioners) who all work together to provide you with the care you need, when you need it.  We recommend signing up for the patient portal called "MyChart".  Sign up information is provided on this After Visit Summary.  MyChart is used to connect with patients for Virtual Visits (Telemedicine).  Patients are able to view lab/test results, encounter notes, upcoming appointments, etc.  Non-urgent messages can be sent to your provider as well.   To learn more about what you can do with MyChart, go to [email or web address].    Your next appointment:   6-8 week(s)  The format for your next appointment:   In Person  Provider:   You may see Werner Lean, MD or one of the following Advanced Practice Providers on your designated Care Team:    Melina Copa, PA-C  Ermalinda Barrios, PA-C

## 2020-10-21 NOTE — Progress Notes (Signed)
Cardiology Office Note:    Date:  10/21/2020   ID:  [name], DOB 04-Mar-1956, MRN [id number]  PCP:  [name], MD   [facility] Providers Cardiologist:  Werner Lean, MD     Referring MD: [name], MD   CC: Lack of energy Consulted for the evaluation of CAD at the behest of [name], MD  History of Present Illness:    [name] is a 65 y.o. male with a hx of mild non obstructive CAD, AAA,Morbid Obesity, DM, and HTN; OSA on CPAP COVID-19 2021 who presents for evaluation 10/21/20.  Patient notes that he is feeling that on some days he can walk 100 yards and felt that he could walk a mile.  Can't tell the difference of when he will have a 100 yard day vs 1 mile.  This occurs randomly.   Has had no chest pain, chest pressure, chest tightness, chest stinging.  Discomfort [place] and feels like can't catch up breath.  Had shortness of breath coming up here. Notes shortness of breath presently but attributes this to walk up here.  No PND or orthopnea with CPAP. Tiredness is usually associated with SOB.  Notes bendopnea, no weight gain, and notes leg swelling ,with abdominal swelling.  No syncope or near syncope but occasionally feels dizzy: about once a week when he is washing his hair, and his eyes are closed. Notes  no palpitations or funny heart beats.     Notes new skin itching every evening that improved this and help him sleep.    Ambulatory BP 130/80- pulse.   Past Medical History:  Diagnosis Date  . Abdominal aortic aneurysm (AAA), 30-34 mm diameter (HCC) 06/01/2017   Nml w/ greatest dm 3 cm US Aorta 06/2016, Abd [place] 05/01/2017 showed infrarenal dilatation at 3.4 cm - rec repeat US in 3 yrs.  . Arthritis   . Cervical disc disease 12/26/2011  . Coronary artery disease involving native coronary artery of native heart without angina pectoris 12/01/2016   He had coronary angiography in 2011. There was irregularity  within the LAD with up to 30% narrowing. The myocardial perfusion imaging done 11/07/2015 did not demonstrate any evidence of ischemia with a low risk nuclear stress test other than EF estimated at 47%. Chest CT 01/25/2016 showed diffuse coronary artery calcifications with heavy calcifications in the LAD. Pt seen by cardiology Dr. Linard Millers   . Diabetes mellitus   . Difficult intubation 04/05/2014  . Dyslipidemia   . Family history of anesthesia complication    pt states took days for his father to awaken after mask was used   . Fatty liver 06/01/2017   [place] 05/01/2017  . GERD (gastroesophageal reflux disease)    has been having acid reflux since recent endoscopy   . H/O hiatal hernia    repair with lap band, now has ventral hernia midline abd  . Hearing loss-aides 03/26/2012  . Hyperlipidemia   . Hypertension   . Morbid obesity ([place])   . OSA (obstructive sleep apnea)   . Rotator cuff tear   . Seasonal allergies   . Shortness of breath    pt states related to high BP meds with extended walking or climbling stairs  . Sleep apnea     Past Surgical History:  Procedure Laterality Date  . CARDIAC CATHETERIZATION     approx 3 years ago   . COLONOSCOPY WITH PROPOFOL N/A 03/10/2015  Procedure: COLONOSCOPY WITH PROPOFOL;  Surgeon: Milus Banister, MD;  Location: [facility];  Service: Endoscopy;  Laterality: N/A;  . colonscopy      2012  . ESOPHAGOGASTRODUODENOSCOPY (EGD) WITH PROPOFOL N/A 03/10/2015   Procedure: ESOPHAGOGASTRODUODENOSCOPY (EGD) WITH PROPOFOL;  Surgeon: Milus Banister, MD;  Location: [facility];  Service: Endoscopy;  Laterality: N/A;  . HERNIA REPAIR     with gastric banding  . LAPAROSCOPIC GASTRIC BANDING N/A 04/05/2014   Procedure: LAPAROSCOPIC GASTRIC BANDING;  Surgeon: Pedro Earls, MD;  Location: [facility];  Service: General;  Laterality: N/A;  . MENISCUS REPAIR Left    left knee torn meniscus  . VASECTOMY      Current Medications: Current Meds  Medication Sig   . aspirin 81 MG tablet Take 81 mg by mouth every morning.   Marland Kitchen azithromycin (ZITHROMAX) 250 MG tablet as needed.  Marland Kitchen DILT-XR 120 MG 24 hr capsule Take 120 mg by mouth daily.  Marland Kitchen diltiazem (TIAZAC) 240 MG 24 hr capsule Take by mouth.  . doxycycline (VIBRA-TABS) 100 MG tablet Take 1 tablet (100 mg total) by mouth 2 (two) times daily.  . fluticasone (FLONASE) 50 MCG/ACT nasal spray Place 2 sprays into both nostrils daily.  . furosemide (LASIX) 40 MG tablet Take 1 tablet (40 mg total) by mouth daily.  Marland Kitchen glucose blood (TRUETEST TEST) test strip Use as instructed  . hydrochlorothiazide (HYDRODIURIL) 12.5 MG tablet Take 12.5 mg by mouth daily.  Marland Kitchen ibuprofen (ADVIL,MOTRIN) 200 MG tablet Take 400-600 mg by mouth once as needed for headache or mild pain.  . Insulin Degludec (TRESIBA FLEXTOUCH) 200 UNIT/ML SOPN Inject 80 Units into the skin daily. Need Appointment before any more refill  . Insulin Pen Needle (B-D UF III MINI PEN NEEDLES) 31G X 5 MM MISC USE AS DIRECTED ONCE A DAY  . ketoconazole (NIZORAL) 2 % cream Apply 1 application topically 2 (two) times daily.  Marland Kitchen lisinopril-hydrochlorothiazide (PRINZIDE,ZESTORETIC) 20-12.5 MG tablet Take 2 tablets by mouth every morning.  . metoprolol tartrate (LOPRESSOR) 100 MG tablet Take 100 mg by mouth daily.  . Multiple Vitamin (MULTIVITAMIN) tablet Take 1 tablet by mouth every morning.   . nystatin (MYCOSTATIN/NYSTOP) powder Apply topically 4 (four) times daily.  . Potassium Chloride ER 20 MEQ TBCR Take 1 tablet by mouth daily.  . potassium chloride SA (K-DUR,KLOR-CON) 20 MEQ tablet Take 1 tablet (20 mEq total) by mouth daily.  . rosuvastatin (CRESTOR) 20 MG tablet Take 1 tablet (20 mg total) by mouth daily.  . tamsulosin (FLOMAX) 0.4 MG CAPS capsule Take 1 capsule (0.4 mg total) by mouth daily after supper.  . [DISCONTINUED] CARTIA XT 240 MG 24 hr capsule Take 1 capsule (240 mg total) by mouth daily.  . [DISCONTINUED] furosemide (LASIX) 20 MG tablet Take 1  tablet (20 mg total) by mouth daily.  . [DISCONTINUED] metoprolol succinate (TOPROL-XL) 100 MG 24 hr tablet TAKE 1 TABLET BY MOUTH DAILY WITH OR IMMEDIATELY FOLLOWING A MEAL     Allergies:   Erythromycin, Penicillins, and Quercus robur   Social History   Socioeconomic History  . Marital status: Married    Spouse name: Not on file  . Number of children: 2  . Years of education: Not on file  . Highest education level: Not on file  Occupational History  . Occupation: owner    Comment: Associate Professor  Tobacco Use  . Smoking status: Former Smoker    Packs/day: 4.00    Years: 20.00  Pack years: 80.00    Types: Cigarettes    Start date: 07/19/1973    Quit date: 12/13/1991    Years since quitting: 28.8  . Smokeless tobacco: Never Used  Vaping Use  . Vaping Use: Never used  Substance and Sexual Activity  . Alcohol use: Yes    Alcohol/week: 3.0 - 5.0 standard drinks    Types: 3 - 5 Glasses of wine per week    Comment: takes 3 to 5 glasses of wine nightly   . Drug use: No  . Sexual activity: Not on file  Other Topics Concern  . Not on file  Social History Narrative  . 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 There is no history of Colon cancer. History of coronary artery disease notable for no members. History of heart failure notable for no members. History of arrhythmia notable for no members.  ROS:   Please see the history of present illness.    Notes improving diarrhea from distant seafood illness  All other systems  reviewed and are negative.  EKGs/Labs/Other Studies Reviewed:    The following studies were reviewed today:  EKG:  EKG is  ordered today.  The ekg ordered today demonstrates  10/21/20: Sinus bradycardia rate 55 1st HB  NonCardiac CT: Date: 01/25/2016 Results: Aortic Atherosclerosis 3VD Calcification with LAD predominance  NM Stress Testing : Date: 11/08/2015 Results:  Nuclear stress EF: 47%. The left ventricular ejection fraction is mildly decreased (45-54%).  There was no ST segment deviation noted during stress.  This is a low risk study.  There is no evidence of ischemia or previous infarction  Abdominal Aortic Duplex Date: 04/17/2019 Abdominal Aorta: There is evidence of abnormal dilatation of the distal  Abdominal aorta. The largest aortic measurement is 2.9 cm. The largest  aortic diameter remains essentially unchanged compared to prior exam.  Previous diameter measurement was 3.0 cm  obtained on 06/2016.   Left/Right Heart Catheterizations: [id number] Results: Mild obstructive only per chart review.   Recent Labs: No results found for requested labs within last 8760 hours.  Recent Lipid Panel    Component Value Date/Time   CHOL 147 08/15/2018 1001   TRIG 194 (H) 08/15/2018 1001   HDL 54 08/15/2018 1001   CHOLHDL 2.7 08/15/2018 1001   CHOLHDL 5.2 (H) 09/07/2015 1802   VLDL NOT CALC 09/07/2015 1802   LDLCALC 54 08/15/2018 1001    Risk Assessment/Calculations:     N/A  Physical Exam:    VS:  BP 120/60   Pulse (!) 55   Ht 6' (1.829 m)   Wt (!) 303 lb (137.4 kg)   SpO2 97%   BMI 41.09 kg/m     Wt Readings from Last 3 Encounters:  10/21/20 (!) 303 lb (137.4 kg)  08/15/18 292 lb 3.2 oz (132.5 kg)  04/30/18 296 lb 6.4 oz (134.4 kg)    GEN: Obese male, well developed in no acute distress HEENT: Normal NECK: No JVD;  LYMPHATICS: No lymphadenopathy CARDIAC: RRR, no murmurs, rubs,  gallops RESPIRATORY:  Clear to auscultation without rales, wheezing or  rhonchi; poor respiratory effort ABDOMEN: Soft, non-tender, distended with abdominal hernia MUSCULOSKELETAL:  2+ edema; No deformity  SKIN: Warm and dry NEUROLOGIC:  Alert and oriented x 3 PSYCHIATRIC:  Normal affect   ASSESSMENT:    1. Abdominal aortic aneurysm (AAA), 30-34 mm diameter (HCC)   2. Coronary artery disease involving native coronary artery of native heart without angina pectoris   3. Primary hypertension   4. DM type 2, uncontrolled, with renal complications ([place])   5. OSA (obstructive sleep apnea)   6. BMI 40.0-44.9, adult ([place])   7. Aortic atherosclerosis ([place])   8. DOE (dyspnea on exertion)    PLAN:    In order of problems listed above:  CAD; mild non obstructive in 2011; LAD disease, Asymptomatic Aortic atherosclerosis AAA - 30 mm (vs ULN) Morbid Obesity, DM, and HTN;  OSA on CPAP DOE Sinus bradycardia with 1st HB - will get and echocardiogram and BNP, low threshold for CMR (Patient width 60 cm and could fit) - will continue lisinopril- HCTZ 40/25 mg PO daily; Increase lasix 40 mg Po Daily; keep potassium supplementation and may drop HCTZ soon - continue ASA 81 mg PO Daily rosuvastatin 20 mg PO Daily  - at next repeat AAA duplex - for now keep diltiazem 360 mg PO Daily per PC MD and metoprolol 100 mg PO BID;   6-8 weeks follow up unless new symptoms or abnormal test results warranting change in plan  Would be reasonable for  APP Follow up    Medication Adjustments/Labs and Tests Ordered: Current medicines are reviewed at length with the patient today.  Concerns regarding medicines are outlined above.  Orders Placed This Encounter  Procedures  . Basic metabolic panel  . Pro b natriuretic peptide (BNP)  . EKG 12-Lead   Meds ordered this encounter  Medications  . furosemide (LASIX) 40 MG tablet    Sig: Take 1 tablet (40 mg total) by mouth daily.    Dispense:  90 tablet    Refill:  3    Patient Instructions  Medication Instructions:  Your  physician has recommended you make the following change in your medication:  INCREASE: furosemide (Lasix) to 40 mg by mouth daily  *If you need a refill on your cardiac medications before your next appointment, please call your pharmacy*   Lab Work: TODAY: BNP In 1 week: BMP If you have labs (blood work) drawn today and your tests are completely normal, you will receive your results only by: Marland Kitchen MyChart Message (if you have MyChart) OR . A paper copy in the mail If you have any lab test that is abnormal or we need to change your treatment, we will call you to review the results.   Testing/Procedures: NONE   Follow-Up: At [facility], you and your health needs are our priority.  As part of our continuing mission to provide you with exceptional heart care, we have created designated Provider Care Teams.  These Care Teams include your primary Cardiologist (physician) and Advanced Practice Providers (APPs -  Physician Assistants and Nurse Practitioners) who all work together to provide you with the care you need, when you need it.  We recommend signing up for the patient portal called "MyChart".  Sign up information is provided on this After Visit Summary.  MyChart is used to connect with patients for Virtual Visits (Telemedicine).  Patients are able to view lab/test results, encounter notes, upcoming appointments, etc.  Non-urgent messages can be sent to your provider as well.   To learn more about what you can do with MyChart, go to [email or web address].    Your next appointment:   6-8 week(s)  The format for your next appointment:   In Person  Provider:   You may see Werner Lean, MD or one of the following Advanced Practice Providers on your designated Care Team:    Melina Copa, PA-C  Ermalinda Barrios, PA-C          Signed, Werner Lean, MD  10/21/2020 9:50 AM    [place]

## 2020-10-22 LAB — PRO B NATRIURETIC PEPTIDE: NT-Pro BNP: 18 pg/mL (ref 0–376)

## 2020-10-28 ENCOUNTER — Other Ambulatory Visit: Payer: Self-pay

## 2020-10-28 ENCOUNTER — Other Ambulatory Visit: Payer: Medicare Other | Admitting: *Deleted

## 2020-10-28 DIAGNOSIS — R0609 Other forms of dyspnea: Secondary | ICD-10-CM

## 2020-10-28 DIAGNOSIS — R0600 Dyspnea, unspecified: Secondary | ICD-10-CM

## 2020-10-29 LAB — BASIC METABOLIC PANEL
BUN/Creatinine Ratio: 13 (ref 10–24)
BUN: 19 mg/dL (ref 8–27)
CO2: 29 mmol/L (ref 20–29)
Calcium: 9.5 mg/dL (ref 8.6–10.2)
Chloride: 94 mmol/L — ABNORMAL LOW (ref 96–106)
Creatinine, Ser: 1.41 mg/dL — ABNORMAL HIGH (ref 0.76–1.27)
Glucose: 198 mg/dL — ABNORMAL HIGH (ref 65–99)
Potassium: 3.4 mmol/L — ABNORMAL LOW (ref 3.5–5.2)
Sodium: 140 mmol/L (ref 134–144)
eGFR: 55 mL/min/{1.73_m2} — ABNORMAL LOW (ref >59)

## 2020-10-31 ENCOUNTER — Telehealth: Payer: Self-pay | Admitting: Internal Medicine

## 2020-10-31 DIAGNOSIS — R0600 Dyspnea, unspecified: Secondary | ICD-10-CM

## 2020-10-31 DIAGNOSIS — Z5181 Encounter for therapeutic drug level monitoring: Secondary | ICD-10-CM

## 2020-10-31 DIAGNOSIS — R0609 Other forms of dyspnea: Secondary | ICD-10-CM

## 2020-10-31 MED ORDER — POTASSIUM CHLORIDE CRYS ER 20 MEQ PO TBCR: 40.0000 meq | EXTENDED_RELEASE_TABLET | Freq: Every day | ORAL | 3 refills | Status: DC

## 2020-10-31 NOTE — Telephone Encounter (Signed)
Called patient reviewed lab results.  He reports that increase furosemide dose has improved his symptoms.(swelling)  Patient is unable to have labs drawn in exactly 2 weeks.  He reports he is flying out of town on 11/14/20 and will not return until December 02, 2020.  Lab scheduled for 1/61/09 d/t this conflict.  Increased dose of potassium explained to pt and prescription sent to pharmacy of choice. He verbalizes understanding.

## 2020-10-31 NOTE — Telephone Encounter (Signed)
PT returning a call about results

## 2020-10-31 NOTE — Telephone Encounter (Signed)
-----   Message from Werner Lean, MD sent at 10/31/2020 11:00 AM EDT ----- Results: Increase in creatinine, stable potassium Plan: If symptoms have improved; will continue lasix 40 mg PO Daily, recheck BMP in two weeks, and increase potassium to 40 meq If no change in symptoms, will return to 20 mg PO Daily  Werner Lean, MD

## 2020-11-02 NOTE — Telephone Encounter (Signed)
Labs Received from 10/08/20; TGS 483, unable to calculate LDL from this; LDL from 05/16/21 was 55.  Notes suggest primary has been working with patient to cut back alcohol with concerns for alcohol associated hypertriglyceridemia which is very reasonable.  Hgb 14 Platelet 192 K 3.2-> started on potassium supplementation BNP 34 TSH 1.66  No changes based on old records received.

## 2020-11-08 ENCOUNTER — Ambulatory Visit: Payer: Medicare Other | Admitting: Podiatry

## 2020-11-11 ENCOUNTER — Other Ambulatory Visit: Payer: Self-pay

## 2020-11-11 ENCOUNTER — Other Ambulatory Visit: Payer: Medicare Other | Admitting: *Deleted

## 2020-11-11 DIAGNOSIS — R0609 Other forms of dyspnea: Secondary | ICD-10-CM

## 2020-11-11 DIAGNOSIS — R0600 Dyspnea, unspecified: Secondary | ICD-10-CM

## 2020-11-11 DIAGNOSIS — Z5181 Encounter for therapeutic drug level monitoring: Secondary | ICD-10-CM

## 2020-11-11 LAB — BASIC METABOLIC PANEL
BUN/Creatinine Ratio: 13 (ref 10–24)
BUN: 17 mg/dL (ref 8–27)
CO2: 30 mmol/L — ABNORMAL HIGH (ref 20–29)
Calcium: 8.9 mg/dL (ref 8.6–10.2)
Chloride: 89 mmol/L — ABNORMAL LOW (ref 96–106)
Creatinine, Ser: 1.34 mg/dL — ABNORMAL HIGH (ref 0.76–1.27)
Glucose: 182 mg/dL — ABNORMAL HIGH (ref 65–99)
Potassium: 3.1 mmol/L — ABNORMAL LOW (ref 3.5–5.2)
Sodium: 136 mmol/L (ref 134–144)
eGFR: 59 mL/min/{1.73_m2} — ABNORMAL LOW (ref >59)

## 2020-11-15 ENCOUNTER — Telehealth: Payer: Self-pay

## 2020-11-15 MED ORDER — POTASSIUM CHLORIDE CRYS ER 20 MEQ PO TBCR: 60.0000 meq | EXTENDED_RELEASE_TABLET | Freq: Every day | ORAL | 3 refills | Status: DC

## 2020-11-15 NOTE — Telephone Encounter (Signed)
Called patient notified him of results and MD recommendations.  He is agreeable to plan.  He requested that I send him a list of potassium rich foods to my chart.  All questions answered.

## 2020-11-15 NOTE — Telephone Encounter (Signed)
-----   Message from Werner Lean, MD sent at 11/13/2020 10:15 AM EDT ----- Results: Creatinine improved; K is still low Plan: Increase to 60 meq Potassium, BMP, BNP at next visit Potential aldactone start at next visit  Werner Lean, MD

## 2020-12-16 ENCOUNTER — Other Ambulatory Visit (HOSPITAL_COMMUNITY): Payer: Self-pay

## 2020-12-16 MED ORDER — RIVAROXABAN (XARELTO) VTE STARTER PACK (15 & 20 MG)
ORAL_TABLET | ORAL | 0 refills | Status: DC
  Filled 2020-12-16: qty 51, 30d supply, fill #0

## 2020-12-19 NOTE — Progress Notes (Signed)
Cardiology Office Note:    Date:  12/20/2020   ID:  [name], DOB [birth year], MRN [id number]  PCP:  [name], MD   [facility] Providers Cardiologist:  Werner Lean, MD     Referring MD: [name], MD   CC: Lack of energy; HF follow up  History of Present Illness:    [name] is a 65 y.o. male with a hx of mild non obstructive CAD, AAA,Morbid Obesity, DM, and HTN; OSA on CPAP COVID-19 2021 who presents for evaluation 10/21/20.  In interim of this visit, patient started on lasix with some improved symptoms.  Echo pending.  Seen 12/20/20.  Patient notes that he is doing ok.  Since last visit notes that he started on Xarelto. changes.  Relevant interval testing or therapy include CTPE.  Has pulmonary follow up.  No chest pain or pressure. Since we some still have a fair amount of short windedness and no PND/Orthopnea.  No weight gain.  Leg swelling has improved but persists.  No palpitations or syncope.  Has bilateral knee pain and and has no association of this with his statin (long time).   Past Medical History:  Diagnosis Date   Abdominal aortic aneurysm (AAA), 30-34 mm diameter ([place]) 06/01/2017   Nml w/ greatest dm 3 cm US Aorta 06/2016, Abd [place] 05/01/2017 showed infrarenal dilatation at 3.4 cm - rec repeat US in 3 yrs.   Arthritis    Cervical disc disease 12/26/2011   Coronary artery disease involving native coronary artery of native heart without angina pectoris 12/01/2016   He had coronary angiography in 2011. There was irregularity within the LAD with up to 30% narrowing. The myocardial perfusion imaging done 11/07/2015 did not demonstrate any evidence of ischemia with a low risk nuclear stress test other than EF estimated at 47%. Chest CT 01/25/2016 showed diffuse coronary artery calcifications with heavy calcifications in the LAD. Pt seen by cardiology Dr. Linard Millers    Diabetes mellitus    Difficult intubation 04/05/2014   Dyslipidemia     Family history of anesthesia complication    pt states took days for his father to awaken after mask was used    Fatty liver 06/01/2017   [place] 05/01/2017   GERD (gastroesophageal reflux disease)    has been having acid reflux since recent endoscopy    H/O hiatal hernia    repair with lap band, now has ventral hernia midline abd   Hearing loss-aides 03/26/2012   Hyperlipidemia    Hypertension    Morbid obesity ([place])    OSA (obstructive sleep apnea)    Rotator cuff tear    Seasonal allergies    Shortness of breath    pt states related to high BP meds with extended walking or climbling stairs   Sleep apnea     Past Surgical History:  Procedure Laterality Date   CARDIAC CATHETERIZATION     approx 3 years ago    COLONOSCOPY WITH PROPOFOL N/A 03/10/2015   Procedure: COLONOSCOPY WITH PROPOFOL;  Surgeon: Milus Banister, MD;  Location: [facility];  Service: Endoscopy;  Laterality: N/A;   colonscopy      2012   ESOPHAGOGASTRODUODENOSCOPY (EGD) WITH PROPOFOL N/A 03/10/2015   Procedure: ESOPHAGOGASTRODUODENOSCOPY (EGD) WITH PROPOFOL;  Surgeon: Milus Banister, MD;  Location: [facility];  Service: Endoscopy;  Laterality: N/A;   HERNIA REPAIR     with gastric banding   LAPAROSCOPIC GASTRIC BANDING N/A 04/05/2014   Procedure:  LAPAROSCOPIC GASTRIC BANDING;  Surgeon: Pedro Earls, MD;  Location: [facility];  Service: General;  Laterality: N/A;   MENISCUS REPAIR Left    left knee torn meniscus   VASECTOMY      Current Medications: Current Meds  Medication Sig   azithromycin (ZITHROMAX) 250 MG tablet as needed.   diltiazem (TIAZAC) 300 MG 24 hr capsule Take 300 mg by mouth daily.   doxycycline (VIBRAMYCIN) 100 MG capsule Take 100 mg by mouth 2 (two) times daily.   fluticasone (FLONASE) 50 MCG/ACT nasal spray Place 2 sprays into both nostrils daily.   glucose blood (TRUETEST TEST) test strip Use as instructed   ibuprofen (ADVIL,MOTRIN) 200 MG tablet Take 400-600 mg by mouth once as needed  for headache or mild pain.   insulin degludec (TRESIBA FLEXTOUCH) 200 UNIT/ML FlexTouch Pen Inject 104 Units into the skin daily.   Insulin Pen Needle (B-D UF III MINI PEN NEEDLES) 31G X 5 MM MISC USE AS DIRECTED ONCE A DAY   ketoconazole (NIZORAL) 2 % cream Apply 1 application topically 2 (two) times daily.   Multiple Vitamin (MULTIVITAMIN) tablet Take 1 tablet by mouth every morning.    nystatin (MYCOSTATIN/NYSTOP) powder Apply topically 4 (four) times daily.   Potassium Chloride ER 20 MEQ TBCR Take 40 mEq by mouth daily.   RIVAROXABAN (XARELTO) VTE STARTER PACK (15 & 20 MG) Use as directed on package for pulmonary embolism   rosuvastatin (CRESTOR) 20 MG tablet Take 1 tablet (20 mg total) by mouth daily.   tamsulosin (FLOMAX) 0.4 MG CAPS capsule Take 1 capsule (0.4 mg total) by mouth daily after supper.   [DISCONTINUED] aspirin 81 MG tablet Take 81 mg by mouth every morning.    [DISCONTINUED] furosemide (LASIX) 40 MG tablet Take 1 tablet (40 mg total) by mouth daily.   [DISCONTINUED] lisinopril-hydrochlorothiazide (PRINZIDE,ZESTORETIC) 20-12.5 MG tablet Take 2 tablets by mouth every morning.   [DISCONTINUED] metoprolol tartrate (LOPRESSOR) 100 MG tablet Take 100 mg by mouth daily.     Allergies:   Erythromycin, Penicillins, and Quercus robur   Social History   Socioeconomic History   Marital status: Married    Spouse name: Not on file   Number of children: 2   Years of education: Not on file   Highest education level: Not on file  Occupational History   Occupation: owner    Comment: [employer]  Tobacco Use   Smoking status: Former    Packs/day: 4.00    Years: 20.00    Pack years: 80.00    Types: Cigarettes    Start date: 07/19/1973    Quit date: 12/13/1991    Years since quitting: 29.0   Smokeless tobacco: Never  Vaping Use   Vaping Use: Never used  Substance and Sexual Activity   Alcohol use: Yes    Alcohol/week: 3.0 - 5.0 standard drinks    Types: 3 - 5 Glasses of  wine per week    Comment: takes 3 to 5 glasses of wine nightly    Drug use: No   Sexual activity: Not on file  Other Topics Concern   Not on file  Social History Narrative   Not on file   Social Determinants of Health   Financial Resource Strain: Not on file  Food Insecurity: Not on file  Transportation Needs: Not on file  Physical Activity: Not on file  Stress: Not on file  Social Connections: Not on file    Social: I take care of his wife  Margarita Grizzle as well; 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 There is no history of Colon cancer. History of coronary artery disease notable for no members. History of heart failure notable for no members. History of arrhythmia notable for no members.  ROS:   Please see the history of present illness.    Notes improving diarrhea from distant seafood illness  All other systems reviewed and are negative.  EKGs/Labs/Other Studies Reviewed:    The following studies were reviewed today:  EKG:   10/21/20: Sinus bradycardia rate 55 1st HB  NonCardiac CT: Date: 01/25/2016 Results: Aortic Atherosclerosis 3VD Calcification with LAD predominance  OSH CTPE  DATE 12/16/20 The ascending thoracic aorta measures 3.6 x 3.5 cm in transverse by AP dimension. The descending thoracic aorta measures 2.6 x 2.6 cm in transverse by AP dimension. There is atherosclerotic disease of the thoracic aorta and coronary arteries without evidence of focal intimal irregularity.   There is a central pulmonary arterial filling defect in the right lower lobe segmental pulmonary artery (series 4, image 43). No CT evidence of right heart strain. No pericardial effusion or bulky mediastinal adenopathy.   No focal consolidation. No pleural effusion or pneumothorax.   Gastric band is present. There is a cyst dorsally  seen in the upper pole the right kidney. The visualized portions of the upper abdomen are otherwiseunremarkable.  NM Stress Testing : Date: 11/08/2015 Results: Nuclear stress EF: 47%. The left ventricular ejection fraction is mildly decreased (45-54%). There was no ST segment deviation noted during stress. This is a low risk study. There is no evidence of ischemia or previous infarction  Abdominal Aortic Duplex Date: 04/17/2019 Abdominal Aorta: There is evidence of abnormal dilatation of the distal  Abdominal aorta. The largest aortic measurement is 2.9 cm. The largest  aortic diameter remains essentially unchanged compared to prior exam.  Previous diameter measurement was 3.0 cm  obtained on 06/2016.   Left/Right Heart Catheterizations: [id number] Results: Mild obstructive only per chart review.   Recent Labs: 10/21/2020: NT-Pro BNP 18 11/11/2020: BUN 17; Creatinine, Ser 1.34; Potassium 3.1; Sodium 136  Recent Lipid Panel    Component Value Date/Time   CHOL 147 08/15/2018 1001   TRIG 194 (H) 08/15/2018 1001   HDL 54 08/15/2018 1001   CHOLHDL 2.7 08/15/2018 1001   CHOLHDL 5.2 (H) 09/07/2015 1802   VLDL NOT CALC 09/07/2015 1802   LDLCALC 54 08/15/2018 1001    Risk Assessment/Calculations:     N/A  Physical Exam:    VS:  BP 120/60   Pulse (!) 57   Ht 6' (1.829 m)   Wt 136.1 kg   SpO2 96%   BMI 40.69 kg/m     Wt Readings from Last 3 Encounters:  12/20/20 136.1 kg  10/21/20 (!) 137.4 kg  08/15/18 132.5 kg    GEN: Obese male, well developed in no acute distress HEENT: Normal NECK: No JVD LYMPHATICS: No lymphadenopathy CARDIAC: RRR, no murmurs, rubs, gallops RESPIRATORY:  Clear to auscultation without rales, wheezing or rhonchi; poor respiratory effort ABDOMEN: Soft, non-tender, distended with abdominal hernia MUSCULOSKELETAL:  1+ edema; No deformity  SKIN: Warm and dry NEUROLOGIC:  Alert and oriented x 3 PSYCHIATRIC:  Normal affect   ASSESSMENT:    1.  Other pulmonary embolism without acute cor pulmonale, unspecified chronicity (HCC)   2. Aortic atherosclerosis ([place])  3. Coronary artery disease involving native coronary artery of native heart without angina pectoris   4. Abdominal aortic aneurysm (AAA), 30-34 mm diameter (HCC)   5. DM type 2, uncontrolled, with renal complications ([place])   6. BMI 40.0-44.9, adult [facility])     PLAN:    In order of problems listed above:  HF NOS CAD; mild non obstructive in 2011; LAD disease, Asymptomatic Aortic atherosclerosis AAA - 30 mm (vs ULN)- consider imaging in the future visit Morbid Obesity, DM, and HTN;  OSA on CPAP New PE Sinus bradycardia - will stop ASA and metoprolol tartrate 100 mg PO Daily  - continue Xarelto - will get and echocardiogram - lasix increase to 40 mg BID - stop HCTZ; transition to lisinopril 40 mg PO daily - continue K - repeat BMP in 7-10 days - based on echo results may need stop in CCB; patient to check amb BM - continue statin- likely not related to his leg pain  Time Spent Directly with Patient:   I have spent a total of 50 minutes with the patient reviewing notes, imaging, EKGs, labs and examining the patient as well as establishing an assessment and plan that was discussed personally with the patient.  > 50% of time was spent in direct patient care and/ family and reviewing imaging with patient.   6-8 weeks follow up unless new symptoms or abnormal test results warranting change in plan      Medication Adjustments/Labs and Tests Ordered: Current medicines are reviewed at length with the patient today.  Concerns regarding medicines are outlined above.  No orders of the defined types were placed in this encounter.  No orders of the defined types were placed in this encounter.   There are no Patient Instructions on file for this visit.   Signed, Werner Lean, MD  12/20/2020 10:41 AM    [place]

## 2020-12-20 ENCOUNTER — Encounter: Payer: Self-pay | Admitting: Internal Medicine

## 2020-12-20 ENCOUNTER — Other Ambulatory Visit: Payer: Self-pay

## 2020-12-20 ENCOUNTER — Ambulatory Visit (INDEPENDENT_AMBULATORY_CARE_PROVIDER_SITE_OTHER): Payer: Medicare Other | Admitting: Internal Medicine

## 2020-12-20 VITALS — BP 120/60 | HR 57 | Ht 72.0 in | Wt 300.0 lb

## 2020-12-20 DIAGNOSIS — Z6841 Body Mass Index (BMI) 40.0 and over, adult: Secondary | ICD-10-CM

## 2020-12-20 DIAGNOSIS — I700 Atherosclerosis of aorta: Secondary | ICD-10-CM

## 2020-12-20 DIAGNOSIS — E1129 Type 2 diabetes mellitus with other diabetic kidney complication: Secondary | ICD-10-CM

## 2020-12-20 DIAGNOSIS — I714 Abdominal aortic aneurysm, without rupture, unspecified: Secondary | ICD-10-CM

## 2020-12-20 DIAGNOSIS — I2699 Other pulmonary embolism without acute cor pulmonale: Secondary | ICD-10-CM | POA: Diagnosis not present

## 2020-12-20 DIAGNOSIS — I2510 Atherosclerotic heart disease of native coronary artery without angina pectoris: Secondary | ICD-10-CM

## 2020-12-20 DIAGNOSIS — E1165 Type 2 diabetes mellitus with hyperglycemia: Secondary | ICD-10-CM

## 2020-12-20 DIAGNOSIS — IMO0002 Reserved for concepts with insufficient information to code with codable children: Secondary | ICD-10-CM

## 2020-12-20 MED ORDER — LISINOPRIL 40 MG PO TABS: 40.0000 mg | ORAL_TABLET | Freq: Every day | ORAL | 3 refills | Status: DC

## 2020-12-20 MED ORDER — FUROSEMIDE 40 MG PO TABS: 40.0000 mg | ORAL_TABLET | Freq: Two times a day (BID) | ORAL | 3 refills | Status: DC

## 2020-12-20 NOTE — Patient Instructions (Addendum)
Medication Instructions:  Your physician has recommended you make the following change in your medication:  STOP: aspirin STOP: metoprolol  STOP: lisinopril/ HCTZ  INCREASE: furosemide (Lasix) to 40 mg by mouth twice daily START: lisinopril 40 mg by mouth daily *If you need a refill on your cardiac medications before your next appointment, please call your pharmacy*   Lab Work: IN 7-10 DAYS: BMP If you have labs (blood work) drawn today and your tests are completely normal, you will receive your results only by: [place] (if you have MyChart) OR A paper copy in the mail If you have any lab test that is abnormal or we need to change your treatment, we will call you to review the results.   Testing/Procedures: NONE   Follow-Up: At [facility], you and your health needs are our priority.  As part of our continuing mission to provide you with exceptional heart care, we have created designated Provider Care Teams.  These Care Teams include your primary Cardiologist (physician) and Advanced Practice Providers (APPs -  Physician Assistants and Nurse Practitioners) who all work together to provide you with the care you need, when you need it.  We recommend signing up for the patient portal called "MyChart".  Sign up information is provided on this After Visit Summary.  MyChart is used to connect with patients for Virtual Visits (Telemedicine).  Patients are able to view lab/test results, encounter notes, upcoming appointments, etc.  Non-urgent messages can be sent to your provider as well.   To learn more about what you can do with MyChart, go to [email or web address].    Your next appointment:   6-8 week(s)  The format for your next appointment:   In Person  Provider:   You may see Werner Lean, MD or one of the following Advanced Practice Providers on your designated Care Team:   Melina Copa, PA-C Ermalinda Barrios, PA-C

## 2020-12-21 ENCOUNTER — Ambulatory Visit: Payer: Medicare Other | Admitting: Internal Medicine

## 2020-12-23 ENCOUNTER — Telehealth: Payer: Self-pay | Admitting: Internal Medicine

## 2020-12-23 DIAGNOSIS — R0609 Other forms of dyspnea: Secondary | ICD-10-CM

## 2020-12-23 DIAGNOSIS — I2510 Atherosclerotic heart disease of native coronary artery without angina pectoris: Secondary | ICD-10-CM

## 2020-12-23 DIAGNOSIS — I714 Abdominal aortic aneurysm, without rupture, unspecified: Secondary | ICD-10-CM

## 2020-12-23 DIAGNOSIS — I700 Atherosclerosis of aorta: Secondary | ICD-10-CM

## 2020-12-23 DIAGNOSIS — R0600 Dyspnea, unspecified: Secondary | ICD-10-CM

## 2020-12-23 NOTE — Telephone Encounter (Signed)
Can youy please upload into PACS. Pleas esend for PACS upload. And send a message back to me once dispatched. Then in 2 weeks when I am back from [place] I can take a look

## 2020-12-23 NOTE — Telephone Encounter (Signed)
[name] can you please advise if disc was sent to PACS?

## 2020-12-23 NOTE — Telephone Encounter (Signed)
Noted.   Will route message to MD/nurse to f/u once disc has been reviewed.

## 2020-12-24 ENCOUNTER — Other Ambulatory Visit: Payer: Self-pay | Admitting: Podiatry

## 2020-12-24 ENCOUNTER — Encounter: Payer: Self-pay | Admitting: Podiatry

## 2020-12-24 MED ORDER — DOXYCYCLINE HYCLATE 100 MG PO TABS: 100.0000 mg | ORAL_TABLET | Freq: Two times a day (BID) | ORAL | 0 refills | Status: DC

## 2020-12-25 NOTE — Telephone Encounter (Signed)
FYI for MR 

## 2020-12-26 ENCOUNTER — Other Ambulatory Visit: Payer: Self-pay

## 2020-12-26 ENCOUNTER — Other Ambulatory Visit: Payer: Self-pay | Admitting: Podiatry

## 2020-12-26 ENCOUNTER — Encounter: Payer: Self-pay | Admitting: Podiatry

## 2020-12-26 ENCOUNTER — Ambulatory Visit (INDEPENDENT_AMBULATORY_CARE_PROVIDER_SITE_OTHER): Payer: Medicare Other

## 2020-12-26 ENCOUNTER — Ambulatory Visit (INDEPENDENT_AMBULATORY_CARE_PROVIDER_SITE_OTHER): Payer: Medicare Other | Admitting: Podiatry

## 2020-12-26 DIAGNOSIS — L03031 Cellulitis of right toe: Secondary | ICD-10-CM

## 2020-12-26 DIAGNOSIS — L97519 Non-pressure chronic ulcer of other part of right foot with unspecified severity: Secondary | ICD-10-CM

## 2020-12-26 DIAGNOSIS — I2510 Atherosclerotic heart disease of native coronary artery without angina pectoris: Secondary | ICD-10-CM | POA: Diagnosis not present

## 2020-12-26 DIAGNOSIS — E11621 Type 2 diabetes mellitus with foot ulcer: Secondary | ICD-10-CM

## 2020-12-26 MED ORDER — CLINDAMYCIN HCL 300 MG PO CAPS: 300.0000 mg | ORAL_CAPSULE | Freq: Three times a day (TID) | ORAL | 0 refills | Status: DC

## 2020-12-26 MED ORDER — PANTOPRAZOLE SODIUM 40 MG PO TBEC: 40.0000 mg | DELAYED_RELEASE_TABLET | Freq: Every day | ORAL | 0 refills | Status: DC

## 2020-12-27 ENCOUNTER — Ambulatory Visit
Admission: RE | Admit: 2020-12-27 | Discharge: 2020-12-27 | Disposition: A | Discharge status: Home / Self Care | Payer: Self-pay | Source: Ambulatory Visit | Attending: Internal Medicine | Admitting: Internal Medicine

## 2020-12-27 ENCOUNTER — Other Ambulatory Visit (HOSPITAL_COMMUNITY): Payer: Self-pay | Admitting: Podiatry

## 2020-12-27 ENCOUNTER — Other Ambulatory Visit: Payer: Medicare Other | Admitting: *Deleted

## 2020-12-27 DIAGNOSIS — I2510 Atherosclerotic heart disease of native coronary artery without angina pectoris: Secondary | ICD-10-CM

## 2020-12-27 DIAGNOSIS — I714 Abdominal aortic aneurysm, without rupture, unspecified: Secondary | ICD-10-CM

## 2020-12-27 DIAGNOSIS — R0609 Other forms of dyspnea: Secondary | ICD-10-CM

## 2020-12-27 DIAGNOSIS — I700 Atherosclerosis of aorta: Secondary | ICD-10-CM

## 2020-12-27 DIAGNOSIS — I2699 Other pulmonary embolism without acute cor pulmonale: Secondary | ICD-10-CM

## 2020-12-27 DIAGNOSIS — L97501 Non-pressure chronic ulcer of other part of unspecified foot limited to breakdown of skin: Secondary | ICD-10-CM

## 2020-12-27 DIAGNOSIS — R0600 Dyspnea, unspecified: Secondary | ICD-10-CM

## 2020-12-27 DIAGNOSIS — E08621 Diabetes mellitus due to underlying condition with foot ulcer: Secondary | ICD-10-CM

## 2020-12-27 NOTE — Telephone Encounter (Signed)
Disc has been obtained and has been sent to PACS for them to get it uploaded. Once I have received the disc back, I will update MR.

## 2020-12-28 ENCOUNTER — Telehealth: Payer: Self-pay

## 2020-12-28 LAB — BASIC METABOLIC PANEL
BUN/Creatinine Ratio: 13 (ref 10–24)
BUN: 15 mg/dL (ref 8–27)
CO2: 24 mmol/L (ref 20–29)
Calcium: 8.9 mg/dL (ref 8.6–10.2)
Chloride: 97 mmol/L (ref 96–106)
Creatinine, Ser: 1.19 mg/dL (ref 0.76–1.27)
Glucose: 194 mg/dL — ABNORMAL HIGH (ref 65–99)
Potassium: 3.9 mmol/L (ref 3.5–5.2)
Sodium: 139 mmol/L (ref 134–144)
eGFR: 68 mL/min/{1.73_m2} (ref >59)

## 2020-12-28 NOTE — Progress Notes (Signed)
Subjective: 65 year old male presents the office today for concerns of continued wound on the bottom of his right big toe and has had some chronic growing swelling and redness of the toe.  I last saw him in February for this needs follow-up Dr. March Rummage since then.  He states that he has had some bleeding to the toe since his last appointment in April.  He did go to [place] in it worsened use, clindamycin.  He did well with this medication but he needs to have pantoprazole to take with the medication. Denies any systemic complaints such as fevers, chills, nausea, vomiting. No acute changes since last appointment, and no other complaints at this time.   Objective: AAO x3, NAD DP/PT pulses palpable bilaterally, CRT less than 3 seconds On the plantar aspect of the right hallux the hyperkeratotic tissue.  Upon debridement of the wound measures 0.7 x 0.4 x 0.4 cm.  Difficult to measure the periwound measurement as there is a callus overlying the wound.  There is no probing to bone, undermining or tunneling.  There is localized edema and erythema present to the digit.  There is no malodor.  The hallux toenails bilaterally have some dried blood under the nail the nails are firmly adhered. No pain with calf compression, swelling, warmth, erythema  Assessment: Chronic ulceration right hallux  Plan: -All treatment options discussed with the patient including all alternatives, risks, complications.  -X-rays obtained reviewed.  No definitive osteomyelitis identified there is no soft tissue emphysema. -Sharply debrided the wound today utilizing the 312 with scalpel debride nonviable devitalized tissue to help promote wound healing.  No blood loss identified.  Tolerated the procedure well. -Antibiotic ointment dressing changes for now.  Offloading shoe dispensed. -Given the ongoing nature of the symptoms have ordered ABI as well as an MRI to rule out osteomyelitis -Clindamycin prescribed as well as  pantoprazole -Patient encouraged to call the office with any questions, concerns, change in symptoms.   Return for toe ulcer in 10-14 days.  Trula Slade DPM

## 2020-12-28 NOTE — Telephone Encounter (Signed)
Called pt to review medication list he sent in via my chart.  He was not home and unable to verify all medications.  He did report that he takes furosemide 80 mg PO QD and zestoretic 40 mg PO QD.  Will review at next OV 02/01/21.

## 2020-12-29 ENCOUNTER — Other Ambulatory Visit: Payer: Self-pay

## 2020-12-29 ENCOUNTER — Ambulatory Visit (HOSPITAL_COMMUNITY)
Admission: RE | Admit: 2020-12-29 | Discharge: 2020-12-29 | Disposition: A | Discharge status: Home / Self Care | Payer: Medicare Other | Source: Ambulatory Visit | Attending: Cardiovascular Disease | Admitting: Cardiovascular Disease

## 2020-12-29 DIAGNOSIS — L97501 Non-pressure chronic ulcer of other part of unspecified foot limited to breakdown of skin: Secondary | ICD-10-CM | POA: Insufficient documentation

## 2020-12-29 DIAGNOSIS — L97519 Non-pressure chronic ulcer of other part of right foot with unspecified severity: Secondary | ICD-10-CM | POA: Diagnosis present

## 2020-12-29 DIAGNOSIS — E11621 Type 2 diabetes mellitus with foot ulcer: Secondary | ICD-10-CM | POA: Diagnosis present

## 2020-12-29 DIAGNOSIS — E08621 Diabetes mellitus due to underlying condition with foot ulcer: Secondary | ICD-10-CM | POA: Diagnosis present

## 2020-12-29 DIAGNOSIS — L97521 Non-pressure chronic ulcer of other part of left foot limited to breakdown of skin: Secondary | ICD-10-CM | POA: Diagnosis not present

## 2020-12-30 ENCOUNTER — Telehealth: Payer: Self-pay | Admitting: *Deleted

## 2020-12-30 NOTE — Telephone Encounter (Signed)
Called and left a message for the patient and relayed the message per Dr Wagoner. Toni Hoffmeister 

## 2020-12-30 NOTE — Telephone Encounter (Signed)
-----   Message from Trula Slade, DPM sent at 12/29/2020  7:40 PM EDT ----- Please let him know the circulation test was normal.  Thank you.

## 2021-01-02 NOTE — Telephone Encounter (Signed)
Hello Dr. Chase Caller, please advise on mychart message, thanks!  Hi, So [facility] found a blood clot in my lung ...and my appointment with you is Aug. 18 at 2:15.  I am concerned and would like to meet much sooner.  [facility] did not define the size of the clot which I found very odd.  How big is the clot and can I be seen by you very soon? Thanks, [name] I have been on hold with your front office for the past 17 minutes..(hanging up now) I need your help.

## 2021-01-03 NOTE — Telephone Encounter (Signed)
Note started in error.

## 2021-01-04 ENCOUNTER — Other Ambulatory Visit: Payer: Self-pay

## 2021-01-04 ENCOUNTER — Encounter (HOSPITAL_BASED_OUTPATIENT_CLINIC_OR_DEPARTMENT_OTHER): Payer: Medicare Other | Attending: Physician Assistant | Admitting: Physician Assistant

## 2021-01-04 DIAGNOSIS — I2782 Chronic pulmonary embolism: Secondary | ICD-10-CM | POA: Diagnosis not present

## 2021-01-04 DIAGNOSIS — Z7901 Long term (current) use of anticoagulants: Secondary | ICD-10-CM | POA: Diagnosis not present

## 2021-01-04 DIAGNOSIS — E11621 Type 2 diabetes mellitus with foot ulcer: Secondary | ICD-10-CM | POA: Diagnosis not present

## 2021-01-04 DIAGNOSIS — L97512 Non-pressure chronic ulcer of other part of right foot with fat layer exposed: Secondary | ICD-10-CM | POA: Diagnosis not present

## 2021-01-05 NOTE — Progress Notes (Signed)
DEADRIAN, TOYA ([id number]) Visit Report for 01/04/2021 Abuse/Suicide Risk Screen Details Patient Name: Date of Service: [place], [name] 01/04/2021 9:00 A M Medical Record Number: [id number] Patient Account Number: [id number] Date of Birth/Sex: Treating RN: [birth year] (65 y.o. Marcheta Grammes Primary Care Kelseigh Diver: [name] Other Clinician: Referring Paisyn Guercio: Treating Zyler Hyson/Extender: [name] in Treatment: 0 Abuse/Suicide Risk Screen Items Answer ABUSE RISK SCREEN: Has anyone close to you tried to hurt or harm you recentlyo No Do you feel uncomfortable with anyone in your familyo No Has anyone forced you do things that you didnt want to doo No Electronic Signature(s) Signed: 01/04/2021 5:31:35 PM By: Lorrin Jackson Entered By: Lorrin Jackson on 01/04/2021 09:23:04 -------------------------------------------------------------------------------- Activities of Daily Living Details Patient Name: Date of Service: [name], [name] 01/04/2021 9:00 A M Medical Record Number: [id number] Patient Account Number: [id number] Date of Birth/Sex: Treating RN: Jan 05, 1956 (65 y.o. Marcheta Grammes Primary Care Kinsie Belford: [name] Other Clinician: Referring Analeia Ismael: Treating Shailah Gibbins/Extender: [name] in Treatment: 0 Activities of Daily Living Items Answer Activities of Daily Living (Please select one for each item) Drive Automobile Completely Able T Medications ake Completely Able Use T elephone Completely Able Care for Appearance Completely Able Use T oilet Completely Able Bath / Shower Completely Able Dress Self Completely Able Feed Self Completely Able Walk Completely Able Get In / Out Bed Completely Able Housework Completely Able Prepare Meals Completely [place] for Self Completely Able Electronic Signature(s) Signed: 01/04/2021 5:31:35 PM By: Lorrin Jackson Entered By: Lorrin Jackson on 01/04/2021 32:99:24 -------------------------------------------------------------------------------- Education Screening Details Patient Name: Date of Service: [name], [name] 01/04/2021 9:00 A M Medical Record Number: [id number] Patient Account Number: [id number] Date of Birth/Sex: Treating RN: [birth year] (65 y.o. Marcheta Grammes Primary Care Barb Shear: [name] Other Clinician: Referring Sinclair Alligood: Treating Sakira Dahmer/Extender: [name] in Treatment: 0 Primary Learner Assessed: Patient Learning Preferences/Education Level/Primary Language Learning Preference: Explanation, Demonstration, Printed Material Highest Education Level: College or Above Preferred Language: English Cognitive Barrier Language Barrier: No Translator Needed: No Memory Deficit: No Emotional Barrier: No Cultural/Religious Beliefs Affecting Medical Care: No Physical Barrier Impaired Vision: Yes Glasses Impaired Hearing: No Decreased Hand dexterity: No Knowledge/Comprehension Knowledge Level: High Comprehension Level: High Ability to understand written instructions: High Ability to understand verbal instructions: High Motivation Anxiety Level: Calm Cooperation: Cooperative Education Importance: Acknowledges Need Interest in Health Problems: Asks Questions Perception: Coherent Willingness to Engage in Self-Management High Activities: Readiness to Engage in Self-Management High Activities: Electronic Signature(s) Signed: 01/04/2021 5:31:35 PM By: Lorrin Jackson Entered By: Lorrin Jackson on 01/04/2021 09:23:56 -------------------------------------------------------------------------------- Fall Risk Assessment Details Patient Name: Date of Service: [name], [name] 01/04/2021 9:00 A M Medical Record Number: [id number] Patient Account Number: [id number] Date of Birth/Sex: Treating RN: [birth year] (65 y.o. Marcheta Grammes Primary Care Rawan Riendeau: [name] Other Clinician: Referring Preston Garabedian: Treating Kerrie Timm/Extender: [name] in Treatment: 0 Fall Risk Assessment Items Have you had 2 or more falls in the last 12 monthso 0 No Have you had any fall that resulted in injury in the last 12 monthso 0 No FALLS RISK SCREEN History of falling - immediate or within 3 months 0 No Secondary diagnosis (Do you have 2 or more medical diagnoseso) 0 No Ambulatory aid None/bed rest/wheelchair/nurse 0 Yes Crutches/cane/walker 0 No  Furniture 0 No Intravenous therapy Access/Saline/Heparin Lock 0 No Gait/Transferring Normal/ bed rest/ wheelchair 0 Yes Weak (short steps with or without shuffle, stooped but able to lift head while walking, may seek 0 No support from furniture) Impaired (short steps with shuffle, may have difficulty arising from chair, head down, impaired 0 No balance) Mental Status Oriented to own ability 0 Yes Electronic Signature(s) Signed: 01/04/2021 5:31:35 PM By: Lorrin Jackson Entered By: Lorrin Jackson on 01/04/2021 09:24:10 -------------------------------------------------------------------------------- Foot Assessment Details Patient Name: Date of Service: [name], [name] 01/04/2021 9:00 A M Medical Record Number: [id number] Patient Account Number: [id number] Date of Birth/Sex: Treating RN: Mar 02, 1956 (65 y.o. Marcheta Grammes Primary Care Iyani Dresner: [name] Other Clinician: Referring Lunabella Badgett: Treating Annalyce Lanpher/Extender: [name] in Treatment: 0 Foot Assessment Items Site Locations + = Sensation present, - = Sensation absent, C = Callus, U = Ulcer R = Redness, W = Warmth, M = Maceration, PU = Pre-ulcerative lesion F = Fissure, S = Swelling, D = Dryness Assessment Right: Left: Other Deformity: No No Prior Foot Ulcer: No No Prior Amputation: No No Charcot Joint: No No Ambulatory Status: Ambulatory  Without Help Gait: Steady Electronic Signature(s) Signed: 01/04/2021 5:31:35 PM By: Lorrin Jackson Entered By: Lorrin Jackson on 01/04/2021 09:29:01 -------------------------------------------------------------------------------- Nutrition Risk Screening Details Patient Name: Date of Service: [name], [name]. 01/04/2021 9:00 A M Medical Record Number: [id number] Patient Account Number: [id number] Date of Birth/Sex: Treating RN: 1955/10/07 (65 y.o. Marcheta Grammes Primary Care Corry Storie: [name] Other Clinician: Referring Havard Radigan: Treating Saryn Cherry/Extender: [name] in Treatment: 0 Height (in): Weight (lbs): Body Mass Index (BMI): Nutrition Risk Screening Items Score Screening NUTRITION RISK SCREEN: I have an illness or condition that made me change the kind and/or amount of food I eat 0 No I eat fewer than two meals per day 0 No I eat few fruits and vegetables, or milk products 0 No I have three or more drinks of beer, liquor or wine almost every day 0 No I have tooth or mouth problems that make it hard for me to eat 0 No I don't always have enough money to buy the food I need 0 No I eat alone most of the time 0 No I take three or more different prescribed or over-the-counter drugs a day 1 Yes Without wanting to, I have lost or gained 10 pounds in the last six months 0 No I am not always physically able to shop, cook and/or feed myself 0 No Nutrition Protocols Good Risk Protocol 0 No interventions needed Moderate Risk Protocol High Risk Proctocol Risk Level: Good Risk Score: 1 Electronic Signature(s) Signed: 01/04/2021 5:31:35 PM By: Lorrin Jackson Entered By: Lorrin Jackson on 01/04/2021 09:24:40

## 2021-01-05 NOTE — Progress Notes (Signed)
[name], [name] ([id number]) Visit Report for 01/04/2021 Allergy List Details Patient Name: Date of Service: [name], [name] [name] S. 01/04/2021 9:00 A M Medical Record Number: [id number] Patient Account Number: [id number] Date of Birth/Sex: Treating RN: 1955/12/26 (65 y.o. [name] Primary Care [name]: [name] Other Clinician: Referring [name]: Treating Latricia Cerrito/Extender: [name] in Treatment: 0 Allergies Active Allergies oak Allergy Notes Electronic Signature(s) Signed: 01/04/2021 5:31:35 PM By: [name] Entered By: [name] on 01/04/2021 09:14:35 -------------------------------------------------------------------------------- Arrival Information Details Patient Name: Date of Service: [name] Lefevre, [name] 01/04/2021 9:00 A M Medical Record Number: [id number] Patient Account Number: [id number] Date of Birth/Sex: Treating RN: 1955-10-17 (65 y.o. [name] Primary Care Masaye Gatchalian: [name] Other Clinician: Referring Dimitrious Micciche: Treating [name]: [name], [name] in Treatment: 0 Visit Information Patient Arrived: Ambulatory Arrival Time: 09:08 Accompanied By: Girlfriend Transfer Assistance: None Patient Identification Verified: Yes Secondary Verification Process Completed: Yes Patient Requires Transmission-Based Precautions: [name] Patient Has Alerts: Yes Patient Alerts: Patient on Blood Thinner ABI's R=1.22 L=1.29 Electronic Signature(s) Signed: 01/04/2021 5:31:35 PM By: [name] Entered By: [name] on 01/04/2021 09:12:57 -------------------------------------------------------------------------------- Clinic Level of Care Assessment Details Patient Name: Date of Service: [name], [name] [name] S. 01/04/2021 9:00 A M Medical Record Number: [id number] Patient Account Number: [id number] Date of Birth/Sex: Treating RN: [birth year] (65 y.o. [name] Primary  Care [name]: [name] Other Clinician: Referring [name]: Treating [name]/Extender: [name] in Treatment: 0 Clinic Level of Care Assessment Items TOOL 1 Quantity Score []  - 0 Use when EandM and Procedure is performed on INITIAL visit ASSESSMENTS - Nursing Assessment / Reassessment X- 1 20 General Physical Exam (combine w/ comprehensive assessment (listed just below) when performed on new pt. evals) X- 1 25 Comprehensive Assessment (HX, ROS, Risk Assessments, Wounds Hx, etc.) ASSESSMENTS - Wound and Skin Assessment / Reassessment []  - 0 Dermatologic / Skin Assessment (not related to wound area) ASSESSMENTS - Ostomy and/or Continence Assessment and Care []  - 0 Incontinence Assessment and Management []  - 0 Ostomy Care Assessment and Management (repouching, etc.) PROCESS - Coordination of Care X - Simple Patient / Family Education for ongoing care 1 15 []  - 0 Complex (extensive) Patient / Family Education for ongoing care X- 1 10 Staff obtains Programmer, systems, Records, T Results / Process Orders est []  - 0 Staff telephones HHA, Nursing Homes / Clarify orders / etc []  - 0 Routine Transfer to another Facility (non-emergent condition) []  - 0 Routine Hospital Admission (non-emergent condition) X- 1 15 New Admissions / Biomedical engineer / Ordering NPWT Apligraf, etc. , []  - 0 Emergency Hospital Admission (emergent condition) PROCESS - Special Needs []  - 0 Pediatric / Minor Patient Management []  - 0 Isolation Patient Management []  - 0 Hearing / Language / Visual special needs []  - 0 Assessment of Community assistance (transportation, D/C planning, etc.) []  - 0 Additional assistance / Altered mentation []  - 0 Support Surface(s) Assessment (bed, cushion, seat, etc.) INTERVENTIONS - Miscellaneous []  - 0 External ear exam []  - 0 Patient Transfer (multiple staff / Civil Service fast streamer / Similar devices) []  - 0 Simple Staple / Suture  removal (25 or less) []  - 0 Complex Staple / Suture removal (26 or more) []  - 0 Hypo/Hyperglycemic Management (do not check if billed separately) []  - 0 Ankle / Brachial Index (ABI) - do not check if billed separately Has the  patient been seen at the hospital within the last three years: Yes Total Score: 85 Level Of Care: New/Established - Level 3 Electronic Signature(s) Signed: 01/04/2021 6:23:27 PM By: [name] Gouty RN, BSN Entered By: [name] on 01/04/2021 10:13:01 -------------------------------------------------------------------------------- Encounter Discharge Information Details Patient Name: Date of Service: [name] Lefevre, [name] 01/04/2021 9:00 [place] Record Number: [id number] Patient Account Number: [id number] Date of Birth/Sex: Treating RN: 1955-11-09 (65 y.o. [name] Primary Care Jeanenne Licea: [name] Other Clinician: Referring Tawonna Esquer: Treating Lisaanne Lawrie/Extender: [name] in Treatment: 0 Encounter Discharge Information Items Post Procedure Vitals Discharge Condition: Stable Temperature (F): 98.1 Ambulatory Status: Ambulatory Pulse (bpm): 74 Discharge Destination: Home Respiratory Rate (breaths/min): 18 Transportation: Private Auto Blood Pressure (mmHg): 105/67 Accompanied By: family member Schedule Follow-up Appointment: Yes Clinical Summary of Care: Electronic Signature(s) Signed: 01/04/2021 11:31:18 AM By: [name] Entered By: [name] on 01/04/2021 11:29:31 -------------------------------------------------------------------------------- Lower Extremity Assessment Details Patient Name: Date of Service: [name], [place] S. 01/04/2021 9:00 A M Medical Record Number: [id number] Patient Account Number: [id number] Date of Birth/Sex: Treating RN: 08-11-1955 (65 y.o. [name] Primary Care Minette Manders: [name] Other Clinician: Referring Lashann Hagg: Treating Donivin Wirt/Extender:  [name] in Treatment: 0 Edema Assessment Assessed: [name]] [Right: Yes] Edema: [Left: Ye] [Right: s] Calf Left: Right: Point of Measurement: From Medial Instep 45.4 cm Ankle Left: Right: Point of Measurement: From Medial Instep 33 cm Vascular Assessment Pulses: Dorsalis Pedis Palpable: [Right:Yes] Electronic Signature(s) Signed: 01/04/2021 5:31:35 PM By: [name] Entered By: [name] on 01/04/2021 09:29:52 -------------------------------------------------------------------------------- Multi-Disciplinary Care Plan Details Patient Name: Date of Service: [name] Lefevre, [name] 01/04/2021 9:00 A M Medical Record Number: [id number] Patient Account Number: [id number] Date of Birth/Sex: Treating RN: August 27, 1955 (65 y.o. [name] Primary Care Charla Criscione: [name] Other Clinician: Referring Andie Mungin: Treating Litisha Guagliardo/Extender: [name] in Treatment: 0 Multidisciplinary Care Plan reviewed with physician Active Inactive Nutrition Nursing Diagnoses: Impaired glucose control: actual or potential Potential for alteratiion in Nutrition/Potential for imbalanced nutrition Goals: Patient/caregiver will maintain therapeutic glucose control Date Initiated: 01/04/2021 Target Resolution Date: 02/01/2021 Goal Status: Active Interventions: Assess HgA1c results as ordered upon admission and as needed Assess patient nutrition upon admission and as needed per policy Provide education on elevated blood sugars and impact on wound healing Treatment Activities: Patient referred to Primary Care Physician for further nutritional evaluation : 01/04/2021 Notes: Wound/Skin Impairment Nursing Diagnoses: Impaired tissue integrity Knowledge deficit related to ulceration/compromised skin integrity Goals: Patient/caregiver will verbalize understanding of skin care regimen Date Initiated: 01/04/2021 Target Resolution  Date: 02/01/2021 Goal Status: Active Ulcer/skin breakdown will have a volume reduction of 30% by week 4 Date Initiated: 01/04/2021 Target Resolution Date: 02/01/2021 Goal Status: Active Interventions: Assess patient/caregiver ability to obtain necessary supplies Assess patient/caregiver ability to perform ulcer/skin care regimen upon admission and as needed Assess ulceration(s) every visit Provide education on ulcer and skin care Treatment Activities: Skin care regimen initiated : 01/04/2021 Topical wound management initiated : 01/04/2021 Notes: Electronic Signature(s) Signed: 01/04/2021 6:23:27 PM By: [name] Gouty RN, BSN Entered By: [name] on 01/04/2021 10:10:21 -------------------------------------------------------------------------------- Pain Assessment Details Patient Name: Date of Service: [name] Lefevre, [name] 01/04/2021 9:00 A M Medical Record Number: [id number] Patient Account Number: [id number] Date of Birth/Sex: Treating RN: Dec 30, 1955 (65 y.o. [name] Primary Care Jarryn Altland: [name] Other Clinician: Referring Gabriele Zwilling:  Treating Bryen Hinderman/Extender: [name] in Treatment: 0 Active Problems Location of Pain Severity and Description of Pain Patient Has Paino Yes Site Locations Pain Location: Pain in Ulcers With Dressing Change: Yes Duration of the Pain. Constant / Intermittento Intermittent Rate the pain. Current Pain Level: 2 Worst Pain Level: 8 Character of Pain Describe the Pain: Tender, Throbbing Pain Management and Medication Current Pain Management: Medication: [name] Cold Application: [name] Rest: Yes Massage: [name] Activity: [name] T.E.N.S.: [name] Heat Application: [name] Leg drop or elevation: [name] Is the Current Pain Management Adequate: Inadequate How does your wound impact your activities of daily livingo Sleep: [name] Bathing: [name] Appetite: [name] Relationship With Others: [name] Bladder Continence: [name] Emotions:  [name] Bowel Continence: [name] Work: [name] Toileting: [name] Drive: [name] Dressing: [name] Hobbies: [name] Electronic Signature(s) Signed: 01/04/2021 5:31:35 PM By: [name] Entered By: [name] on 01/04/2021 09:38:21 -------------------------------------------------------------------------------- Patient/Caregiver Education Details Patient Name: Date of Service: [name], [name] 7/20/2022andnbsp9:00 A M Medical Record Number: [id number] Patient Account Number: [id number] Date of Birth/Gender: Treating RN: 05-14-1956 (65 y.o. [name] Primary Care Physician: [name] Other Clinician: Referring Physician: Treating Physician/Extender: [name] in Treatment: 0 Education Assessment Education Provided To: Patient Education Topics Provided Elevated Blood Sugar/ Impact on Healing: Handouts: Elevated Blood Sugars: How Do They Affect Wound Healing Methods: Explain/Verbal, Printed Responses: Reinforcements needed, State content correctly Offloading: Handouts: What is Offloadingo Methods: Explain/Verbal, Printed Responses: Reinforcements needed, State content correctly Wound/Skin Impairment: Handouts: Caring for Your Ulcer, Skin Care Do's and Dont's Methods: Explain/Verbal, Printed Responses: Reinforcements needed, State content correctly Electronic Signature(s) Signed: 01/04/2021 6:23:27 PM By: [name] Gouty RN, BSN Entered By: [name] on 01/04/2021 10:12:02 -------------------------------------------------------------------------------- Wound Assessment Details Patient Name: Date of Service: [name] Lefevre, [name] 01/04/2021 9:00 A M Medical Record Number: [id number] Patient Account Number: [id number] Date of Birth/Sex: Treating RN: [birth year] (65 y.o. [name] Primary Care Garry Bochicchio: [name] Other Clinician: Referring Ouida Abeyta: Treating Damary Doland/Extender: [name] in Treatment:  0 Wound Status Wound Number: 1 Primary Diabetic Wound/Ulcer of the Lower Extremity Etiology: Wound Location: Right, Posterior T Great oe Wound Open Wounding Event: Other Lesion Status: Date Acquired: 06/27/2020 Comorbid Sleep Apnea, Coronary Artery Disease, Hypertension, Type II Weeks Of Treatment: 0 History: Diabetes, Osteoarthritis, Neuropathy Clustered Wound: [name] Wound Measurements Length: (cm) 0.5 Width: (cm) 0.4 Depth: (cm) 0.5 Area: (cm) 0.157 Volume: (cm) 0.079 % Reduction in Area: 0% % Reduction in Volume: 0% Tunneling: [name] Undermining: [name] Wound Description Classification: Grade 1 Wound Margin: Well defined, not attached Exudate Amount: Medium Exudate Type: Serosanguineous Exudate Color: red, brown Foul Odor After Cleansing: [name] Slough/Fibrino [name] Wound Bed Granulation Amount: Large (67-100%) Exposed Structure Granulation Quality: Red Fascia Exposed: [name] Necrotic Amount: None Present (0%) Fat Layer (Subcutaneous Tissue) Exposed: Yes Tendon Exposed: [name] Muscle Exposed: [name] Joint Exposed: [name] Bone Exposed: [name] Assessment Notes Calloused periwound Electronic Signature(s) Signed: 01/04/2021 5:31:35 PM By: [name] Entered By: [name] on 01/04/2021 09:33:54 -------------------------------------------------------------------------------- Vitals Details Patient Name: Date of Service: [name] Lefevre, [name] 01/04/2021 9:00 A M Medical Record Number: [id number] Patient Account Number: [id number] Date of Birth/Sex: Treating RN: Aug 06, 1955 (65 y.o. [name] Primary Care Sharvi Mooneyhan: [name] Other Clinician: Referring Timotheus Salm: Treating Eriel Dunckel/Extender: [name] in Treatment: 0 Vital Signs Time Taken: 09:12 Temperature (F): 98.1 Pulse (bpm): 74 Respiratory Rate (breaths/min):  18 Blood Pressure (mmHg): 105/67 Capillary Blood Glucose (mg/dl): 134 Reference Range: 80 - 120 mg / dl Electronic  Signature(s) Signed: 01/04/2021 5:31:35 PM By: [name] Entered By: [name] on 01/04/2021 09:13:32

## 2021-01-05 NOTE — Progress Notes (Signed)
[name], [name] ([id number]) Visit Report for 01/04/2021 Chief Complaint Document Details Patient Name: Date of Service: [place], [name] 01/04/2021 9:00 A M Medical Record Number: [id number] Patient Account Number: [id number] Date of Birth/Sex: Treating RN: [birth year] (65 y.o. [name] Primary Care Provider: Hayden [name] Other Clinician: Referring Provider: Treating Provider/Extender: [name] in Treatment: 0 Information Obtained from: Patient Chief Complaint Right great toe ulcer Electronic Signature(s) Signed: 01/04/2021 10:05:33 AM By: [name] Keeler PA-C Entered By: [name] on 01/04/2021 10:05:33 -------------------------------------------------------------------------------- Debridement Details Patient Name: Date of Service: [name], [place] S. 01/04/2021 9:00 A M Medical Record Number: [id number] Patient Account Number: [id number] Date of Birth/Sex: Treating RN: Apr 04, 1956 (65 y.o. [name] Primary Care Provider: Hayden [name] Other Clinician: Referring Provider: Treating Provider/Extender: [name] in Treatment: 0 Debridement Performed for Assessment: Wound #1 Right,Plantar T Great oe Performed By: Physician [name] Keeler, PA Debridement Type: Debridement Severity of Tissue Pre Debridement: Fat layer exposed Level of Consciousness (Pre-procedure): Awake and Alert Pre-procedure Verification/Time Out Yes - 10:10 Taken: Start Time: 10:10 Pain Control: Lidocaine 4% T opical Solution T Area Debrided (L x W): otal 1.5 (cm) x 1.5 (cm) = 2.25 (cm) Tissue and other material debrided: Viable, Non-Viable, Callus, Slough, Subcutaneous, Skin: Epidermis, Slough Level: Skin/Subcutaneous Tissue Debridement Description: Excisional Instrument: Curette Bleeding: Minimum Hemostasis Achieved: Pressure End Time: 10:20 Procedural Pain: 0 Post Procedural Pain: 0 Response to  Treatment: Procedure was tolerated well Level of Consciousness (Post- Awake and Alert procedure): Post Debridement Measurements of Total Wound Length: (cm) 0.5 Width: (cm) 0.4 Depth: (cm) 0.4 Volume: (cm) 0.063 Character of Wound/Ulcer Post Debridement: Improved Severity of Tissue Post Debridement: Fat layer exposed Post Procedure Diagnosis Same as Pre-procedure Electronic Signature(s) Signed: 01/04/2021 5:20:22 PM By: [name] Keeler PA-C Signed: 01/04/2021 6:23:27 PM By: [name] Gouty RN, BSN Entered By: [name] on 01/04/2021 10:21:18 -------------------------------------------------------------------------------- HPI Details Patient Name: Date of Service: [name] Lefevre, [name] 01/04/2021 9:00 A M Medical Record Number: [id number] Patient Account Number: [id number] Date of Birth/Sex: Treating RN: 08/27/1955 (65 y.o. [name] Primary Care Provider: Hayden [name] Other Clinician: Referring Provider: Treating Provider/Extender: [name] in Treatment: 0 History of Present Illness HPI Description: 01/04/2021 upon evaluation today patient presents for initial evaluation here in our clinic concerning issues that he has been having with a plantar toe ulceration of the right foot. He tells me this has been going on since around January 2022 when he was on a trip to [place]. Subsequently following he then went to [place] where he had that received care multiple times during the time he was in [place]. He states that [place] seem to be the best as far as that was concerned. With that being said he was initially placed on clindamycin, then doxycycline during the course of this which he feels like did not do as well, and then subsequently he has been on clindamycin which she has 4 days left of at this point. No one has cultured anything up to this point. He has been seeing podiatry and currently Dr. Earleen Newport is to he is seeing. With that being said he  does have x-rays that were really not conclusive for any signs of osteomyelitis. He does have an MRI scheduled for this coming Friday which is just 2 days away. He did have ABIs which were performed  on 12/29/2020 and subsequently this did reveal that he has excellent arterial flow with his findings being normal pretty much across the board. I am very pleased in that regard. His ABI on the right was 1.22 with a TBI of 0.93 and on the left was 1.29 with a TBI of 1.08. Waveforms were triphasic throughout. The patient tells me he is currently been cleaning this with either alcohol or peroxide and then applying Silvadene cream to this and a small dab and wrapping this. 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 Otherwise he is a type II diabetic. Electronic Signature(s) Signed: 01/04/2021 11:25:19 AM By: [name] Keeler PA-C Entered By: [name] on 01/04/2021 11:25:19 -------------------------------------------------------------------------------- Physical Exam Details Patient Name: Date of Service: [name], [place] S. 01/04/2021 9:00 A M Medical Record Number: [id number] Patient Account Number: [id number] Date of Birth/Sex: Treating RN: [birth year] (65 y.o. [name] Primary Care Provider: Hayden [name] Other Clinician: Referring Provider: Treating Provider/Extender: [name] in Treatment: 0 Constitutional sitting or standing blood pressure is within target range for patient.. pulse regular and within target range for patient.Marland Kitchen respirations regular, non-labored and within target range for patient.Marland Kitchen temperature within target range for patient.. Obese and well-hydrated in no acute distress. Eyes conjunctiva clear no eyelid edema noted. pupils equal round and reactive to light and accommodation. Ears, Nose, Mouth, and Throat no  gross abnormality of ear auricles or external auditory canals. normal hearing noted during conversation. mucus membranes moist. Respiratory normal breathing without difficulty. Cardiovascular 2+ dorsalis pedis/posterior tibialis pulses. no clubbing, cyanosis, significant edema, <3 sec cap refill. Musculoskeletal normal gait and posture. no significant deformity or arthritic changes, no loss or range of motion, no clubbing. Psychiatric this patient is able to make decisions and demonstrates good insight into disease process. Alert and Oriented x 3. pleasant and cooperative. Notes Upon inspection patient's wound bed actually showed signs of some granulation epithelization at this point. There was some hypergranular tissue and a significant amount of callus around the edges of the wound. I did perform sharp debridement to clear away the callus which he tolerated today without complication. Post debridement the wound bed appears to be doing much better which is great news and overall I am extremely pleased in that regard. With that being said I do believe the patient would benefit from a total contact cast. Nonetheless I want to ensure his MRI is fine before we even consider that more specifically. He is in agreement with holding in that regard. Electronic Signature(s) Signed: 01/04/2021 11:26:04 AM By: [name] Keeler PA-C Entered By: [name] on 01/04/2021 11:26:03 -------------------------------------------------------------------------------- Physician Orders Details Patient Name: Date of Service: [name] [name] Spry, [name] 01/04/2021 9:00 A M Medical Record Number: [id number] Patient Account Number: [id number] Date of Birth/Sex: Treating RN: 1956/05/02 (65 y.o. [name] Primary Care Provider: Hayden [name] Other Clinician: Referring Provider: Treating Provider/Extender: [name] in Treatment: 0 Verbal / Phone Orders: No Diagnosis Coding ICD-10  Coding Code Description E11.621 Type 2 diabetes mellitus with foot ulcer L97.512 Non-pressure chronic ulcer of other part of right foot with fat layer exposed Z79.01 Long term (current) use of anticoagulants I27.82 Chronic pulmonary embolism Follow-up Appointments ppointment in 1 week. - with Margarita Grizzle Return A Bathing/ [employer] May shower  with protection but do not get wound dressing(s) wet. - on days that dressing is not changed May shower and wash wound with soap and water. Off-Loading Wound #1 Right,Plantar T Great oe Open toe surgical shoe to: - right foot Wound Treatment Wound #1 - T Great oe Wound Laterality: Plantar, Right Cleanser: Soap and Water Every Other Day/15 Days Discharge Instructions: May shower and wash wound with dial antibacterial soap and water prior to dressing change. Prim Dressing: Promogran Prisma Matrix, 4.34 (sq in) (silver collagen) Every Other Day/15 Days ary Discharge Instructions: Moisten collagen with saline or hydrogel Secondary Dressing: Woven Gauze Sponges 2x2 in (Generic) Every Other Day/15 Days Discharge Instructions: Apply over primary dressing as directed. Secondary Dressing: Optifoam Non-Adhesive Dressing, 4x4 in (Generic) Every Other Day/15 Days Discharge Instructions: Apply over primary dressing cut to make foam donut Secured With: Conforming Stretch Gauze Bandage, Sterile 2x75 (in/in) (Generic) Every Other Day/15 Days Discharge Instructions: Secure with stretch gauze as directed. Secured With: Paper Tape, 1x10 (in/yd) (Generic) Every Other Day/15 Days Discharge Instructions: Secure dressing with tape as directed. Electronic Signature(s) Signed: 01/04/2021 5:20:22 PM By: [name] Keeler PA-C Signed: 01/04/2021 6:23:27 PM By: [name] Gouty RN, BSN Entered By: [name] on 01/04/2021 10:27:30 -------------------------------------------------------------------------------- Problem List Details Patient Name: Date of Service: [name] Lefevre, [name] 01/04/2021 9:00 A M Medical Record Number: [id number] Patient Account Number: [id number] Date of Birth/Sex: Treating RN: [birth year] (65 y.o. [name] Primary Care Provider: Hayden [name] Other Clinician: Referring Provider: Treating Provider/Extender: [name] in Treatment: 0 Active Problems ICD-10 Encounter Code Description Active Date MDM Diagnosis E11.621 Type 2 diabetes mellitus with foot ulcer 01/04/2021 No Yes L97.512 Non-pressure chronic ulcer of other part of right foot with fat layer exposed 01/04/2021 No Yes Z79.01 Long term (current) use of anticoagulants 01/04/2021 No Yes I27.82 Chronic pulmonary embolism 01/04/2021 No Yes Inactive Problems Resolved Problems Electronic Signature(s) Signed: 01/04/2021 10:05:17 AM By: [name] Keeler PA-C Entered By: [name] on 01/04/2021 10:05:17 -------------------------------------------------------------------------------- Progress Note Details Patient Name: Date of Service: [name] [name] Spry, [name] 01/04/2021 9:00 A M Medical Record Number: [id number] Patient Account Number: [id number] Date of Birth/Sex: Treating RN: 08-21-1955 (65 y.o. [name] Primary Care Provider: Hayden [name] Other Clinician: Referring Provider: Treating Provider/Extender: [name] in Treatment: 0 Subjective Chief Complaint Information obtained from Patient Right great toe ulcer History of Present Illness (HPI) 01/04/2021 upon evaluation today patient presents for initial evaluation here in our clinic concerning issues that he has been having with a plantar toe ulceration of the right foot. He tells me this has been going on since around January 2022 when he was on a trip to [place]. Subsequently following he then went to [place] where he had that received care multiple times during the time he was in [place]. He states that [place] seem to be the best as  far as that was concerned. With that being said he was initially placed on clindamycin, then doxycycline during the course of this which he feels like did not do as well, and then subsequently he has been on clindamycin which she has 4 days left of at this point. No one has cultured anything up to this point. He has been seeing podiatry and currently Dr. Earleen Newport is to he is seeing. With that being said he does have x-rays that were really not conclusive for any signs of osteomyelitis. He  does have an MRI scheduled for this coming Friday which is just 2 days away. He did have ABIs which were performed on 12/29/2020 and subsequently this did reveal that he has excellent arterial flow with his findings being normal pretty much across the board. I am very pleased in that regard. His ABI on the right was 1.22 with a TBI of 0.93 and on the left was 1.29 with a TBI of 1.08. Waveforms were triphasic throughout. The patient tells me he is currently been cleaning this with either alcohol or peroxide and then applying Silvadene cream to this and a small dab and wrapping this. 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 Otherwise he is a type II diabetic. Patient History Information obtained from Patient. Allergies oak Family History Cancer - Father, Hypertension - Siblings, Lung Disease - Siblings, No family history of Diabetes, Heart Disease, Hereditary Spherocytosis, Kidney Disease, Seizures, Stroke, Thyroid Problems, Tuberculosis. Social History Former smoker - Quit 1993, Marital Status - Single, Alcohol Use - Daily - red wine 2-3 glasses, Drug Use - Prior History, Caffeine Use - Daily - coffee, soda. Medical History Respiratory Patient has history of Sleep Apnea Cardiovascular Patient has history of Coronary Artery Disease, Hypertension Endocrine Patient has history of Type II  Diabetes Musculoskeletal Patient has history of Osteoarthritis Neurologic Patient has history of Neuropathy Patient is treated with Insulin. Blood sugar is tested. Medical A Surgical History Notes nd Respiratory Blood Clot Right Lung Cardiovascular AAA Review of Systems (ROS) Eyes Complains or has symptoms of Glasses / Contacts. Ear/Nose/Mouth/Throat Denies complaints or symptoms of Chronic sinus problems or rhinitis. Respiratory Complains or has symptoms of Shortness of Breath - on exertion. Gastrointestinal Denies complaints or symptoms of Frequent diarrhea, Nausea, Vomiting. Genitourinary Denies complaints or symptoms of Frequent urination. Integumentary (Skin) Complains or has symptoms of Wounds. Psychiatric Denies complaints or symptoms of Claustrophobia, Suicidal. Objective Constitutional sitting or standing blood pressure is within target range for patient.. pulse regular and within target range for patient.Marland Kitchen respirations regular, non-labored and within target range for patient.Marland Kitchen temperature within target range for patient.. Obese and well-hydrated in no acute distress. Vitals Time Taken: 9:12 AM, Temperature: 98.1 F, Pulse: 74 bpm, Respiratory Rate: 18 breaths/min, Blood Pressure: 105/67 mmHg, Capillary Blood Glucose: 134 mg/dl. Eyes conjunctiva clear no eyelid edema noted. pupils equal round and reactive to light and accommodation. Ears, Nose, Mouth, and Throat no gross abnormality of ear auricles or external auditory canals. normal hearing noted during conversation. mucus membranes moist. Respiratory normal breathing without difficulty. Cardiovascular 2+ dorsalis pedis/posterior tibialis pulses. no clubbing, cyanosis, significant edema, Musculoskeletal normal gait and posture. no significant deformity or arthritic changes, no loss or range of motion, no clubbing. Psychiatric this patient is able to make decisions and demonstrates good insight into disease  process. Alert and Oriented x 3. pleasant and cooperative. General Notes: Upon inspection patient's wound bed actually showed signs of some granulation epithelization at this point. There was some hypergranular tissue and a significant amount of callus around the edges of the wound. I did perform sharp debridement to clear away the callus which he tolerated today without complication. Post debridement the wound bed appears to be doing much better which is great news and overall I am extremely pleased in that regard. With that being said I do believe the patient would benefit from a total contact cast. Nonetheless  I want to ensure his MRI is fine before we even consider that more specifically. He is in agreement with holding in that regard. Integumentary (Hair, Skin) Wound #1 status is Open. Original cause of wound was Other Lesion. The date acquired was: 06/27/2020. The wound is located on the [employer]. oe The wound measures 0.5cm length x 0.4cm width x 0.5cm depth; 0.157cm^2 area and 0.079cm^3 volume. There is Fat Layer (Subcutaneous Tissue) exposed. There is no tunneling or undermining noted. There is a medium amount of serosanguineous drainage noted. The wound margin is well defined and not attached to the wound base. There is large (67-100%) red granulation within the wound bed. There is no necrotic tissue within the wound bed. General Notes: Calloused periwound Assessment Active Problems ICD-10 Type 2 diabetes mellitus with foot ulcer Non-pressure chronic ulcer of other part of right foot with fat layer exposed Long term (current) use of anticoagulants Chronic pulmonary embolism Procedures Wound #1 Pre-procedure diagnosis of Wound #1 is a Diabetic Wound/Ulcer of the Lower Extremity located on the Right,Plantar T Great .Severity of Tissue Pre oe Debridement is: Fat layer exposed. There was a Excisional Skin/Subcutaneous Tissue Debridement with a total area of 2.25 sq cm  performed by [name] Keeler, PA. With the following instrument(s): Curette to remove Viable and Non-Viable tissue/material. Material removed includes Callus, Subcutaneous Tissue, Slough, and Skin: Epidermis after achieving pain control using Lidocaine 4% Topical Solution. No specimens were taken. A time out was conducted at 10:10, prior to the start of the procedure. A Minimum amount of bleeding was controlled with Pressure. The procedure was tolerated well with a pain level of 0 throughout and a pain level of 0 following the procedure. Post Debridement Measurements: 0.5cm length x 0.4cm width x 0.4cm depth; 0.063cm^3 volume. Character of Wound/Ulcer Post Debridement is improved. Severity of Tissue Post Debridement is: Fat layer exposed. Post procedure Diagnosis Wound #1: Same as Pre-Procedure Plan Follow-up Appointments: Return Appointment in 1 week. - with Glynn Octave Shower/ Hygiene: May shower with protection but do not get wound dressing(s) wet. - on days that dressing is not changed May shower and wash wound with soap and water. Off-Loading: Wound #1 Right,Plantar T Great: oe Open toe surgical shoe to: - right foot WOUND #1: - T Great Wound Laterality: Plantar, Right oe Cleanser: Soap and Water Every Other Day/15 Days Discharge Instructions: May shower and wash wound with dial antibacterial soap and water prior to dressing change. Prim Dressing: Promogran Prisma Matrix, 4.34 (sq in) (silver collagen) Every Other Day/15 Days ary Discharge Instructions: Moisten collagen with saline or hydrogel Secondary Dressing: Woven Gauze Sponges 2x2 in (Generic) Every Other Day/15 Days Discharge Instructions: Apply over primary dressing as directed. Secondary Dressing: Optifoam Non-Adhesive Dressing, 4x4 in (Generic) Every Other Day/15 Days Discharge Instructions: Apply over primary dressing cut to make foam donut Secured With: Conforming Stretch Gauze Bandage, Sterile 2x75 (in/in) (Generic)  Every Other Day/15 Days Discharge Instructions: Secure with stretch gauze as directed. Secured With: Paper Tape, 1x10 (in/yd) (Generic) Every Other Day/15 Days Discharge Instructions: Secure dressing with tape as directed. 1. Would recommend currently that we actually initiate treatment with a silver collagen dressing I think this is good to be the best way to go and the patient is in agreement with the plan. 2. I am also can recommend that we have the patient cover this with a foam that can be cut in a doughnut to offload as well as roll gauze to secure in place.  3. I am also can recommend that the patient go ahead and continue with the postop shoe for the time being I think that is ideal until we get the results of the MRI. 4. The patient does have an MRI scheduled for Friday we will ensure that no signs of osteomyelitis are noted and if it indeed is clear I think we should consider a total contact cast for him. The big issue simply is just can to be whether he improves between now and then or not. Obviously if he is seeing improvement with the current measures in the cast may not be absolutely necessary. We will see patient back for reevaluation in 1 week here in the clinic. If anything worsens or changes patient will contact our office for additional recommendations. Electronic Signature(s) Signed: 01/04/2021 11:27:14 AM By: [name] Keeler PA-C Entered By: [name] on 01/04/2021 11:27:13 -------------------------------------------------------------------------------- HxROS Details Patient Name: Date of Service: [name] [name] Spry, [name] 01/04/2021 9:00 A M Medical Record Number: [id number] Patient Account Number: [id number] Date of Birth/Sex: Treating RN: 02/05/1956 (65 y.o. Marcheta Grammes Primary Care Provider: Hayden [name] Other Clinician: Referring Provider: Treating Provider/Extender: [name] in Treatment: 0 Information Obtained  From Patient Eyes Complaints and Symptoms: Positive for: Glasses / Contacts Ear/Nose/Mouth/Throat Complaints and Symptoms: Negative for: Chronic sinus problems or rhinitis Respiratory Complaints and Symptoms: Positive for: Shortness of Breath - on exertion Medical History: Positive for: Sleep Apnea Past Medical History Notes: Blood Clot Right Lung Gastrointestinal Complaints and Symptoms: Negative for: Frequent diarrhea; Nausea; Vomiting Genitourinary Complaints and Symptoms: Negative for: Frequent urination Integumentary (Skin) Complaints and Symptoms: Positive for: Wounds Psychiatric Complaints and Symptoms: Negative for: Claustrophobia; Suicidal Hematologic/Lymphatic Cardiovascular Medical History: Positive for: Coronary Artery Disease; Hypertension Past Medical History Notes: AAA Endocrine Medical History: Positive for: Type II Diabetes Time with diabetes: 10-15 Treated with: Insulin Blood sugar tested every day: Yes Tested : every morning Immunological Musculoskeletal Medical History: Positive for: Osteoarthritis Neurologic Medical History: Positive for: Neuropathy Oncologic Immunizations Pneumococcal Vaccine: Received Pneumococcal Vaccination: No Implantable Devices None Family and Social History Cancer: Yes - Father; Diabetes: No; Heart Disease: No; Hereditary Spherocytosis: No; Hypertension: Yes - Siblings; Kidney Disease: No; Lung Disease: Yes - Siblings; Seizures: No; Stroke: No; Thyroid Problems: No; Tuberculosis: No; Former smoker - Quit 1993; Marital Status - Single; Alcohol Use: Daily - red wine 2-3 glasses; Drug Use: Prior History; Caffeine Use: Daily - coffee, soda; Financial Concerns: No; Food, Clothing or Shelter Needs: No; Support System Lacking: No; Transportation Concerns: No Electronic Signature(s) Signed: 01/04/2021 5:20:22 PM By: [name] Keeler PA-C Signed: 01/04/2021 5:31:35 PM By: Lorrin Jackson Entered By: Lorrin Jackson on  01/04/2021 09:22:58 -------------------------------------------------------------------------------- SuperBill Details Patient Name: Date of Service: [name] Lefevre, [name] 01/04/2021 Medical Record Number: [id number] Patient Account Number: [id number] Date of Birth/Sex: Treating RN: 1955-06-23 (65 y.o. [name] Primary Care Provider: Hayden [name] Other Clinician: Referring Provider: Treating Provider/Extender: [name] in Treatment: 0 Diagnosis Coding ICD-10 Codes Code Description [phone] Type 2 diabetes mellitus with foot ulcer L97.512 Non-pressure chronic ulcer of other part of right foot with fat layer exposed Z79.01 Long term (current) use of anticoagulants I27.82 Chronic pulmonary embolism Facility Procedures CPT4 Code: [id number] Description: 99213 - WOUND CARE VISIT-LEV 3 EST PT Modifier: 25 Quantity: 1 CPT4 Code: [id number] Description: 11042 - DEB SUBQ TISSUE 20 SQ CM/< ICD-10 Diagnosis Description L97.512 Non-pressure chronic ulcer of  other part of right foot with fat layer exposed Modifier: Quantity: 1 Physician Procedures : CPT4 Code Description Modifier [id number] - WC PHYS LEVEL 4 - NEW PT 25 ICD-10 Diagnosis Description E11.621 Type 2 diabetes mellitus with foot ulcer L97.512 Non-pressure chronic ulcer of other part of right foot with fat layer exposed Z79.01 Long  term (current) use of anticoagulants 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

## 2021-01-06 ENCOUNTER — Ambulatory Visit
Admission: RE | Admit: 2021-01-06 | Discharge: 2021-01-06 | Disposition: A | Discharge status: Home / Self Care | Payer: Medicare Other | Source: Ambulatory Visit | Attending: Podiatry | Admitting: Podiatry

## 2021-01-06 ENCOUNTER — Other Ambulatory Visit: Payer: Self-pay

## 2021-01-06 DIAGNOSIS — L97519 Non-pressure chronic ulcer of other part of right foot with unspecified severity: Secondary | ICD-10-CM

## 2021-01-06 DIAGNOSIS — E11621 Type 2 diabetes mellitus with foot ulcer: Secondary | ICD-10-CM

## 2021-01-07 ENCOUNTER — Other Ambulatory Visit (HOSPITAL_COMMUNITY): Payer: Self-pay

## 2021-01-09 ENCOUNTER — Ambulatory Visit (INDEPENDENT_AMBULATORY_CARE_PROVIDER_SITE_OTHER): Payer: Medicare Other | Admitting: Podiatry

## 2021-01-09 ENCOUNTER — Ambulatory Visit: Payer: Medicare Other | Admitting: Podiatry

## 2021-01-09 ENCOUNTER — Encounter: Payer: Self-pay | Admitting: Podiatry

## 2021-01-09 ENCOUNTER — Other Ambulatory Visit: Payer: Self-pay

## 2021-01-09 DIAGNOSIS — I2510 Atherosclerotic heart disease of native coronary artery without angina pectoris: Secondary | ICD-10-CM

## 2021-01-09 DIAGNOSIS — L97519 Non-pressure chronic ulcer of other part of right foot with unspecified severity: Secondary | ICD-10-CM

## 2021-01-09 DIAGNOSIS — E11621 Type 2 diabetes mellitus with foot ulcer: Secondary | ICD-10-CM | POA: Diagnosis not present

## 2021-01-09 DIAGNOSIS — M2041 Other hammer toe(s) (acquired), right foot: Secondary | ICD-10-CM

## 2021-01-09 DIAGNOSIS — E1149 Type 2 diabetes mellitus with other diabetic neurological complication: Secondary | ICD-10-CM | POA: Diagnosis not present

## 2021-01-09 DIAGNOSIS — M2042 Other hammer toe(s) (acquired), left foot: Secondary | ICD-10-CM

## 2021-01-10 NOTE — Telephone Encounter (Signed)
Disc has been returned back to me from [facility] after we sent it to have images uploaded to PACS.  Routing to MR as an Pharmacist, hospital.

## 2021-01-10 NOTE — Telephone Encounter (Signed)
Since PE diagnosis at [facility] he has seen Dr Sindy Messing at cardiology early July 2022 who noted him to be stable. I am unable to access [facility] records for review. If he is stable and withoiut undue dyspnea he can wait till 02/02/21 visit. Or else try to see an APP earlier - if we have a slot  Regarding size of clot - this is not that relevant as opposed to severity. Barring extremely severe cases where in hospital they give a "clot buster", Rx is the same - xarelto. We can address this question in more detail at followup

## 2021-01-10 NOTE — Telephone Encounter (Signed)
Reviewed Dr. Chase Caller recommendations with pt via My Chart message. Nothing further needed at this time.

## 2021-01-11 ENCOUNTER — Other Ambulatory Visit: Payer: Self-pay

## 2021-01-11 ENCOUNTER — Encounter (HOSPITAL_BASED_OUTPATIENT_CLINIC_OR_DEPARTMENT_OTHER): Payer: Medicare Other | Admitting: Physician Assistant

## 2021-01-11 DIAGNOSIS — E11621 Type 2 diabetes mellitus with foot ulcer: Secondary | ICD-10-CM | POA: Diagnosis not present

## 2021-01-11 NOTE — Progress Notes (Addendum)
DAYTONA, TRAM ([id number]) Visit Report for 01/11/2021 Chief Complaint Document Details Patient Name: Date of Service: [place], [name] 01/11/2021 1:45 PM Medical Record Number: [id number] Patient Account Number: [id number] Date of Birth/Sex: Treating RN: 01-Sep-1955 (65 y.o. [name] Primary Care Provider: [name] Other Clinician: Referring Provider: Treating Provider/Extender: [name] in Treatment: 1 Information Obtained from: Patient Chief Complaint Right great toe ulcer Electronic Signature(s) Signed: 01/11/2021 1:46:22 PM By: Worthy Keeler PA-C Entered By: Worthy Keeler on 01/11/2021 13:46:22 -------------------------------------------------------------------------------- Debridement Details Patient Name: Date of Service: [name], [place] S. 01/11/2021 1:45 PM Medical Record Number: [id number] Patient Account Number: [id number] Date of Birth/Sex: Treating RN: Apr 20, 1956 (65 y.o. [name] Primary Care Provider: [name] Other Clinician: Referring Provider: Treating Provider/Extender: [name] in Treatment: 1 Debridement Performed for Assessment: Wound #1 Right,Plantar T Great oe Performed By: Physician Worthy Keeler, PA Debridement Type: Debridement Severity of Tissue Pre Debridement: Fat layer exposed Level of Consciousness (Pre-procedure): Awake and Alert Pre-procedure Verification/Time Out Yes - 13:55 Taken: Start Time: 13:55 Pain Control: Lidocaine 4% T opical Solution T Area Debrided (L x W): otal 1 (cm) x 1 (cm) = 1 (cm) Tissue and other material debrided: Viable, Non-Viable, Callus, Slough, Subcutaneous, Skin: Epidermis, Slough Level: Skin/Subcutaneous Tissue Debridement Description: Excisional Instrument: Curette Bleeding: Minimum Hemostasis Achieved: Pressure End Time: 13:59 Procedural Pain: 0 Post Procedural Pain: 0 Response to Treatment:  Procedure was tolerated well Level of Consciousness (Post- Awake and Alert procedure): Post Debridement Measurements of Total Wound Length: (cm) 0.3 Width: (cm) 0.5 Depth: (cm) 0.3 Volume: (cm) 0.035 Character of Wound/Ulcer Post Debridement: Improved Severity of Tissue Post Debridement: Fat layer exposed Post Procedure Diagnosis Same as Pre-procedure Electronic Signature(s) Signed: 01/11/2021 4:30:28 PM By: Worthy Keeler PA-C Signed: 01/11/2021 5:32:13 PM By: Baruch Gouty RN, BSN Entered By: Baruch Gouty on 01/11/2021 14:00:03 -------------------------------------------------------------------------------- HPI Details Patient Name: Date of Service: [name], [name] 01/11/2021 1:45 PM Medical Record Number: [id number] Patient Account Number: [id number] Date of Birth/Sex: Treating RN: 12-17-1955 (65 y.o. [name] Primary Care Provider: [name] Other Clinician: Referring Provider: Treating Provider/Extender: [name] in Treatment: 1 History of Present Illness HPI Description: 01/04/2021 upon evaluation today patient presents for initial evaluation here in our clinic concerning issues that he has been having with a plantar toe ulceration of the right foot. He tells me this has been going on since around January 2022 when he was on a trip to [place]. Subsequently following he then went to [place] where he had that received care multiple times during the time he was in [place]. He states that [place] seem to be the best as far as that was concerned. With that being said he was initially placed on clindamycin, then doxycycline during the course of this which he feels like did not do as well, and then subsequently he has been on clindamycin which she has 4 days left of at this point. No one has cultured anything up to this point. He has been seeing podiatry and currently Dr. Earleen Newport is to he is seeing. With that being said he does have  x-rays that were really not conclusive for any signs of osteomyelitis. He does have an MRI scheduled for this coming Friday which is just 2 days away. He did have ABIs which were performed on 12/29/2020 and  subsequently this did reveal that he has excellent arterial flow with his findings being normal pretty much across the board. I am very pleased in that regard. His ABI on the right was 1.22 with a TBI of 0.93 and on the left was 1.29 with a TBI of 1.08. Waveforms were triphasic throughout. The patient tells me he is currently been cleaning this with either alcohol or peroxide and then applying Silvadene cream to this and a small dab and wrapping this. 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 Otherwise he is a type II diabetic. 01/11/2021 upon evaluation today patient appears to be doing well with regard to his wound. He is showing signs of improvement which is great news and overall very pleased with where things stand today. There does not appear to be any evidence of active infection at this time which is great news as well. In general I think that the patient is on the right track. His MRI which was actually ordered by Dr. Earleen Newport was negative for any signs of osteomyelitis that is great news. Overall I think that this is going ahead on the right direction with the appropriate care. Electronic Signature(s) Signed: 01/11/2021 2:15:30 PM By: Worthy Keeler PA-C Entered By: Worthy Keeler on 01/11/2021 14:15:30 -------------------------------------------------------------------------------- Physical Exam Details Patient Name: Date of Service: [name], [place] S. 01/11/2021 1:45 PM Medical Record Number: [id number] Patient Account Number: [id number] Date of Birth/Sex: Treating RN: 1955-11-28 (65 y.o. [name] Primary Care Provider: [name] Other  Clinician: Referring Provider: Treating Provider/Extender: [name] in Treatment: 1 Constitutional Well-nourished and well-hydrated in no acute distress. Respiratory normal breathing without difficulty. Psychiatric this patient is able to make decisions and demonstrates good insight into disease process. Alert and Oriented x 3. pleasant and cooperative. Notes Upon inspection patient's wound bed actually showed signs of some callus buildup I did perform sharp debridement clear this away today he tolerated that without complication and postdebridement the wound bed appears to be doing better. With that being said we will get a continue with the collagen I think that still the best way to go and the patient is in agreement with that plan. I do think he could benefit from the total contact cast but right now I think that is of secondary consequence. He is actually thinking of going out of town and really does not want to go forward with a cast at the moment if he is able to heal without it I am okay with that but we definitely need to make sure you are keeping good pressure relief off of this. Electronic Signature(s) Signed: 01/11/2021 2:16:48 PM By: Worthy Keeler PA-C Entered By: Worthy Keeler on 01/11/2021 14:16:48 -------------------------------------------------------------------------------- Physician Orders Details Patient Name: Date of Service: [name], [place] S. 01/11/2021 1:45 PM Medical Record Number: [id number] Patient Account Number: [id number] Date of Birth/Sex: Treating RN: [birth year] (65 y.o. 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 - with Margarita Grizzle Return A Bathing/ Shower/ Hygiene May shower with protection but do not get wound dressing(s) wet. - on days that dressing is not changed May shower and wash wound with soap and water. Off-Loading Wound #1 Right,Plantar T Great oe Open toe surgical shoe to: - right foot Wound Treatment Wound #1 - T Great oe Wound Laterality: Plantar, Right Cleanser: Soap and Water Every Other Day/15 Days Discharge Instructions: May shower and wash wound with dial antibacterial soap and water prior to dressing change. Prim Dressing: Promogran Prisma Matrix, 4.34 (sq in) (silver collagen) Every Other Day/15 Days ary Discharge Instructions: Moisten collagen with saline or hydrogel Secondary Dressing: Woven Gauze Sponges 2x2 in (Generic) Every Other Day/15 Days Discharge Instructions: Apply over primary dressing as directed. Secondary Dressing: Optifoam Non-Adhesive Dressing, 4x4 in (Generic) Every Other Day/15 Days Discharge Instructions: Apply over primary dressing cut to make foam donut Secured With: Conforming Stretch Gauze Bandage, Sterile 2x75 (in/in) (Generic) Every Other Day/15 Days Discharge Instructions: Secure with stretch gauze as directed. Secured With: Paper Tape, 1x10 (in/yd) (Generic) Every Other Day/15 Days Discharge Instructions: Secure dressing with tape as directed. Electronic Signature(s) Signed: 01/11/2021 4:30:28 PM By: Worthy Keeler PA-C Signed: 01/11/2021 5:32:13 PM By: Baruch Gouty RN, BSN Entered By: Baruch Gouty on 01/11/2021 14:03:37 -------------------------------------------------------------------------------- Problem List Details Patient Name: Date of Service: [name], [name] 01/11/2021 1:45 PM Medical Record Number: [id number] Patient Account Number: [id number] Date of  Birth/Sex: Treating RN: 05/23/1956 (64 y.o. 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 01/11/2021 1:45 PM Medical Record Number: [id number] Patient Account Number: [id number] Date of Birth/Sex: Treating RN: March 21, 1956 (65 y.o. [name] Primary Care Provider: [name] Other Clinician: Referring Provider: Treating Provider/Extender: [name] in Treatment: 1 Subjective Chief Complaint Information obtained from Patient Right great toe ulcer History of Present Illness (HPI) 01/04/2021 upon evaluation today patient presents for initial evaluation here in our clinic concerning issues that he has been having with a plantar toe ulceration of the right foot. He tells me this has been going on since around January 2022 when he was on a trip to [place]. Subsequently following he then went to [place] where he had that received care multiple times during the time he was in [place]. He states that [place] seem to be the best as far as that was concerned. With that being said he was initially placed on clindamycin, then doxycycline during the  course of this which he feels like did not do as well, and then subsequently he has been on clindamycin which she has 4 days left of at this point. No one has cultured anything up to this point. He has been seeing podiatry and currently Dr. Earleen Newport is to he is seeing. With that being said he  does have x-rays that were really not conclusive for any signs of osteomyelitis. He does have an MRI scheduled for this coming Friday which is just 2 days away. He did have ABIs which were performed on 12/29/2020 and subsequently this did reveal that he has excellent arterial flow with his findings being normal pretty much across the board. I am very pleased in that regard. His ABI on the right was 1.22 with a TBI of 0.93 and on the left was 1.29 with a TBI of 1.08. Waveforms were triphasic throughout. The patient tells me he is currently been cleaning this with either alcohol or peroxide and then applying Silvadene cream to this and a small dab and wrapping this. 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 Otherwise he is a type II diabetic. 01/11/2021 upon evaluation today patient appears to be doing well with regard to his wound. He is showing signs of improvement which is great news and overall very pleased with where things stand today. There does not appear to be any evidence of active infection at this time which is great news as well. In general I think that the patient is on the right track. His MRI which was actually ordered by Dr. Earleen Newport was negative for any signs of osteomyelitis that is great news. Overall I think that this is going ahead on the right direction with the appropriate care. Objective Constitutional Well-nourished and well-hydrated in no acute distress. Vitals Time Taken: 1:38 PM, Temperature: 98.1 F, Pulse: 65 bpm, Respiratory Rate: 20 breaths/min, Blood Pressure:  115/71 mmHg, Capillary Blood Glucose: 140 mg/dl. Respiratory normal breathing without difficulty. Psychiatric this patient is able to make decisions and demonstrates good insight into disease process. Alert and Oriented x 3. pleasant and cooperative. General Notes: Upon inspection patient's wound bed actually showed signs of some callus buildup I did perform sharp debridement clear this away today he tolerated that without complication and postdebridement the wound bed appears to be doing better. With that being said we will get a continue with the collagen I think that still the best way to go and the patient is in agreement with that plan. I do think he could benefit from the total contact cast but right now I think that is of secondary consequence. He is actually thinking of going out of town and really does not want to go forward with a cast at the moment if he is able to heal without it I am okay with that but we definitely need to make sure you are keeping good pressure relief off of this. Integumentary (Hair, Skin) Wound #1 status is Open. Original cause of wound was Other Lesion. The date acquired was: 06/27/2020. The wound has been in treatment 1 weeks. The wound is located on the [employer]. The wound measures 0.3cm length x 0.1cm width x 0.3cm depth; 0.024cm^2 area and 0.007cm^3 volume. There is oe Fat Layer (Subcutaneous Tissue) exposed. There is no tunneling noted, however, there is undermining starting at 12:00 and ending at 12:00 with a maximum distance of 0.3cm. There is a medium amount of serosanguineous drainage noted. The wound margin is well defined and not attached to the wound base. There is large (67-100%) red granulation within the wound bed. There is no necrotic tissue within the wound bed. General Notes: callous to periwound. Assessment Active Problems ICD-10 Type 2 diabetes  mellitus with foot ulcer Non-pressure chronic ulcer of other part of right foot with  fat layer exposed Long term (current) use of anticoagulants Chronic pulmonary embolism Procedures Wound #1 Pre-procedure diagnosis of Wound #1 is a Diabetic Wound/Ulcer of the Lower Extremity located on the Right,Plantar T Great .Severity of Tissue Pre oe Debridement is: Fat layer exposed. There was a Excisional Skin/Subcutaneous Tissue Debridement with a total area of 1 sq cm performed by Worthy Keeler, PA. With the following instrument(s): Curette to remove Viable and Non-Viable tissue/material. Material removed includes Callus, Subcutaneous Tissue, Slough, and Skin: Epidermis after achieving pain control using Lidocaine 4% Topical Solution. No specimens were taken. A time out was conducted at 13:55, prior to the start of the procedure. A Minimum amount of bleeding was controlled with Pressure. The procedure was tolerated well with a pain level of 0 throughout and a pain level of 0 following the procedure. Post Debridement Measurements: 0.3cm length x 0.5cm width x 0.3cm depth; 0.035cm^3 volume. Character of Wound/Ulcer Post Debridement is improved. Severity of Tissue Post Debridement is: Fat layer exposed. Post procedure Diagnosis Wound #1: Same as Pre-Procedure Plan Follow-up Appointments: Return Appointment in 1 week. - with Glynn Octave Shower/ Hygiene: May shower with protection but do not get wound dressing(s) wet. - on days that dressing is not changed May shower and wash wound with soap and water. Off-Loading: Wound #1 Right,Plantar T Great: oe Open toe surgical shoe to: - right foot WOUND #1: - T Great Wound Laterality: Plantar, Right oe Cleanser: Soap and Water Every Other Day/15 Days Discharge Instructions: May shower and wash wound with dial antibacterial soap and water prior to dressing change. Prim Dressing: Promogran Prisma Matrix, 4.34 (sq in) (silver collagen) Every Other Day/15 Days ary Discharge Instructions: Moisten collagen with saline or hydrogel Secondary  Dressing: Woven Gauze Sponges 2x2 in (Generic) Every Other Day/15 Days Discharge Instructions: Apply over primary dressing as directed. Secondary Dressing: Optifoam Non-Adhesive Dressing, 4x4 in (Generic) Every Other Day/15 Days Discharge Instructions: Apply over primary dressing cut to make foam donut Secured With: Conforming Stretch Gauze Bandage, Sterile 2x75 (in/in) (Generic) Every Other Day/15 Days Discharge Instructions: Secure with stretch gauze as directed. Secured With: Paper T ape, 1x10 (in/yd) (Generic) Every Other Day/15 Days Discharge Instructions: Secure dressing with tape as directed. 1. Would recommend that we going continue with wound care measures as before and the patient is in agreement with the plan this includes the use of the silver collagen dressing. 2. Also can recommend that we have the patient going to continue with the offloading shoe to try to help keep pressure off. The patient states that he was trying to use this is much as he can. 3. Were also using foam to try to help offload the region of the toe as well and keep pressure down which I think is still of utmost importance his wife is changing his dressings for him. 4. We will consider a total contact cast if things are not improving the way we really would like for them to over the next several weeks. We will see patient back for reevaluation in 1 week here in the clinic. If anything worsens or changes patient will contact our office for additional recommendations. Electronic Signature(s) Signed: 01/11/2021 2:17:39 PM By: Worthy Keeler PA-C Entered By: Worthy Keeler on 01/11/2021 14:17:39 -------------------------------------------------------------------------------- SuperBill Details Patient Name: Date of Service: [name], MA RSHA Edwyna Shell S. 01/11/2021 Medical Record Number: [id number] Patient Account Number: [id number] Date of  Birth/Sex: Treating RN: [birth year] (65 y.o. 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

## 2021-01-12 NOTE — Progress Notes (Signed)
Subjective: 65 year old male presents the office today for follow-up evaluation of wound on the plantar aspect of right hallux.  Since I last saw him he went to the wound care center and he still being treated by them.  States the wound is doing better.  No purulence.  Very little drainage.  They are putting collagen on the wound at the wound care center.  Denies any fevers or chills.  Objective: AAO x3, NAD DP/PT pulses palpable bilaterally, CRT less than 3 seconds On the plantar aspect of the right hallux the hyperkeratotic tissue.  Upon debridement of the wound is smaller.  See picture below.  There is no probing, undermining or tunneling.  No surrounding erythema, ascending cellulitis.  There is no fluctuation crepitation but there is no malodor.  Hammertoes are present. No pain with calf compression, swelling, warmth, erythema     Assessment: Chronic ulceration right hallux  Plan: -All treatment options discussed with the patient including all alternatives, risks, complications.  -Reviewed the MRI which is negative for osteomyelitis or abscess. -I sharply debrided the wound today without any complications or bleeding down to healthy, granular tissue.  Continue with dressings per the wound care center.  At this point he is already going to the wound care center as I will defer further treatment for the wound to them.  I would have him follow-up for diabetic shoe measurement.   Trula Slade DPM

## 2021-01-12 NOTE — Progress Notes (Signed)
[name], [name] ([id number]) Visit Report for 01/11/2021 Arrival Information Details Patient Name: Date of Service: [name], [name] 01/11/2021 1:45 PM Medical Record Number: [id number] Patient Account Number: [id number] Date of Birth/Sex: Treating RN: [birth year] (65 y.o. [name], [name] Primary Care [name]: [name] Other Clinician: Referring [name]: Treating [name]/Extender: [name] in Treatment: 1 Visit Information History Since Last Visit Added or deleted any medications: No Patient Arrived: Ambulatory Any new allergies or adverse reactions: No Arrival Time: 13:38 Had a fall or experienced change in No Accompanied By: family member activities of daily living that may affect Transfer Assistance: None risk of falls: Patient Identification Verified: Yes Signs or symptoms of abuse/neglect since No Secondary Verification Process Completed: Yes last visito Patient Requires Transmission-Based Precautions: No Hospitalized since last visit: No Patient Has Alerts: Yes Implantable device outside of the clinic No Patient Alerts: Patient on Blood Thinner excluding ABI's R=1.22 L=1.29 cellular tissue based products placed in the center since last visit: Has Dressing in Place as Prescribed: Yes Has Footwear/Offloading in Place as Yes Prescribed: Right: Surgical Shoe with Pressure Relief Insole Pain Present Now: No Electronic Signature(s) Signed: 01/11/2021 5:41:26 PM By: [name] Entered By: [name] on 01/11/2021 13:38:30 -------------------------------------------------------------------------------- Encounter Discharge Information Details Patient Name: Date of Service: [name] Lefevre, [name] 01/11/2021 1:45 PM Medical Record Number: [id number] Patient Account Number: [id number] Date of Birth/Sex: Treating RN: May 07, 1956 (65 y.o. [name] Primary Care [name]: [name] Other Clinician: Referring  [name]: Treating [name]/Extender: [name] in Treatment: 1 Encounter Discharge Information Items Post Procedure Vitals Discharge Condition: Stable Temperature (F): 98.1 Ambulatory Status: Ambulatory Pulse (bpm): 65 Discharge Destination: Home Respiratory Rate (breaths/min): 18 Transportation: Private Auto Blood Pressure (mmHg): 115/71 Accompanied By: girlgriend Schedule Follow-up Appointment: Yes Clinical Summary of Care: Patient Declined Electronic Signature(s) Signed: 01/12/2021 5:27:01 PM By: [name] Gouty RN, BSN Entered By: [name] on 01/11/2021 17:36:37 -------------------------------------------------------------------------------- Lower Extremity Assessment Details Patient Name: Date of Service: [name], [place] S. 01/11/2021 1:45 PM Medical Record Number: [id number] Patient Account Number: [id number] Date of Birth/Sex: Treating RN: 07/21/1955 (65 y.o. [name] Primary Care Chancie Lampert: [name] Other Clinician: Referring [name]: Treating [name]: [name] in Treatment: 1 Edema Assessment Assessed: [Left: No] [Right: Yes] Edema: [Left: Ye] [Right: s] Calf Left: Right: Point of Measurement: From Medial Instep 45.2 cm 45.5 cm Ankle Left: Right: Point of Measurement: From Medial Instep 30 cm 29 cm Knee To Floor Left: Right: From Medial Instep 46 cm 46 cm Vascular Assessment Pulses: Dorsalis Pedis Palpable: [Right:Yes] Electronic Signature(s) Signed: 01/11/2021 5:32:13 PM By: [name] Gouty RN, BSN Signed: 01/11/2021 5:41:26 PM By: [name] Entered By: [name] on 01/11/2021 14:07:25 -------------------------------------------------------------------------------- Multi-Disciplinary Care Plan Details Patient Name: Date of Service: [name] Lefevre, [name] 01/11/2021 1:45 PM Medical Record Number: [id number] Patient Account Number: [id number] Date of  Birth/Sex: Treating RN: 1956-05-10 (65 y.o. [name] Primary Care Yarithza Mink: [name] Other Clinician: Referring [name]: Treating [name]/Extender: [name] in Treatment: 1 [name] reviewed with physician Active Inactive Nutrition Nursing Diagnoses: Impaired glucose control: actual or potential Potential for alteratiion in Nutrition/Potential for imbalanced nutrition Goals: Patient/caregiver will maintain therapeutic glucose control Date Initiated: 01/04/2021 Target Resolution Date: 02/01/2021 Goal Status: Active Interventions: Assess HgA1c results as ordered upon admission and as needed Assess patient  nutrition upon admission and as needed per policy Provide education on elevated blood sugars and impact on wound healing Treatment Activities: Patient referred to Primary Care Physician for further nutritional evaluation : 01/04/2021 Notes: Wound/Skin Impairment Nursing Diagnoses: Impaired tissue integrity Knowledge deficit related to ulceration/compromised skin integrity Goals: Patient/caregiver will verbalize understanding of skin care regimen Date Initiated: 01/04/2021 Target Resolution Date: 02/01/2021 Goal Status: Active Ulcer/skin breakdown will have a volume reduction of 30% by week 4 Date Initiated: 01/04/2021 Target Resolution Date: 02/01/2021 Goal Status: Active Interventions: Assess patient/caregiver ability to obtain necessary supplies Assess patient/caregiver ability to perform ulcer/skin care regimen upon admission and as needed Assess ulceration(s) every visit Provide education on ulcer and skin care Treatment Activities: Skin care regimen initiated : 01/04/2021 Topical wound management initiated : 01/04/2021 Notes: Electronic Signature(s) Signed: 01/11/2021 5:32:13 PM By: [name] Gouty RN, BSN Entered By: [name] on 01/11/2021  13:53:49 -------------------------------------------------------------------------------- Pain Assessment Details Patient Name: Date of Service: [name], [name] Burke S. 01/11/2021 1:45 PM Medical Record Number: [name] Patient Account Number: [id number] Date of Birth/Sex: Treating RN: Feb 24, 1956 (65 y.o. [name] Primary Care Karman Biswell: [name] Other Clinician: Referring Latia Mataya: Treating Drury Ardizzone/Extender: [name] in Treatment: 1 Active Problems Location of Pain Severity and Description of Pain Patient Has Paino No Site Locations Rate the pain. Rate the pain. Current Pain Level: 0 Pain Management and Medication Current Pain Management: Medication: No Cold Application: No Rest: No Massage: No Activity: No T.E.N.S.: No Heat Application: No Leg drop or elevation: No Is the Current Pain Management Adequate: Adequate How does your wound impact your activities of daily livingo Sleep: No Bathing: No Appetite: No Relationship With Others: No Bladder Continence: No Emotions: No Bowel Continence: No Work: No Toileting: No Drive: No Dressing: No Hobbies: No Electronic Signature(s) Signed: 01/11/2021 5:41:26 PM By: [name] Entered By: [name] on 01/11/2021 13:39:31 -------------------------------------------------------------------------------- Patient/Caregiver Education Details Patient Name: Date of Service: [name], Jens Som 7/27/2022andnbsp1:45 PM Medical Record Number: [name] Patient Account Number: [id number] Date of Birth/Gender: Treating RN: 09-16-1955 (65 y.o. [name] Primary Care Physician: [name] Other Clinician: Referring Physician: Treating Physician/Extender: Luvenia Heller, Penelope Galas in Treatment: 1 Education Assessment Education Provided To: Patient Education Topics Provided Elevated Blood Sugar/ Impact on Healing: Methods:  Explain/Verbal Responses: Reinforcements needed, State content correctly Wound/Skin Impairment: Methods: Explain/Verbal Responses: Reinforcements needed, State content correctly Electronic Signature(s) Signed: 01/11/2021 5:32:13 PM By: [name] Gouty RN, BSN Signed: 01/11/2021 5:32:13 PM By: [name] Gouty RN, BSN Entered By: [name] on 01/11/2021 13:54:26 -------------------------------------------------------------------------------- Wound Assessment Details Patient Name: Date of Service: [name] Lefevre, [name] 01/11/2021 1:45 PM Medical Record Number: [name] Patient Account Number: [id number] Date of Birth/Sex: Treating RN: 12-20-1955 (65 y.o. [name] Primary Care Ahliya Glatt: [name] Other Clinician: Referring Takera Rayl: Treating Brookelyn Gaynor/Extender: [name] in Treatment: 1 Wound Status Wound Number: 1 Primary Diabetic Wound/Ulcer of the Lower Extremity Etiology: Wound Location: Right, Plantar T Great oe Wound Open Wounding Event: Other Lesion Status: Date Acquired: 06/27/2020 Comorbid Sleep Apnea, Coronary Artery Disease, Hypertension, Type II Weeks Of Treatment: 1 History: Diabetes, Osteoarthritis, Neuropathy Clustered Wound: No Photos Wound Measurements Length: (cm) 0.3 Width: (cm) 0.1 Depth: (cm) 0.3 Area: (cm) 0.024 Volume: (cm) 0.007 % Reduction in Area: 84.7% % Reduction in Volume: 91.1% Tunneling: No Undermining: Yes Starting Position (o'clock): 12 Ending Position (o'clock): 12 Maximum Distance: (cm)  0.3 Wound Description Classification: Grade 1 Wound Margin: Well defined, not attached Exudate Amount: Medium Exudate Type: Serosanguineous Exudate Color: red, brown Foul Odor After Cleansing: No Slough/Fibrino No Wound Bed Granulation Amount: Large (67-100%) Exposed Structure Granulation Quality: Red Fascia Exposed: No Necrotic Amount: None Present (0%) Fat Layer (Subcutaneous Tissue) Exposed:  Yes Tendon Exposed: No Muscle Exposed: No Joint Exposed: No Bone Exposed: No Assessment Notes callous to periwound. Treatment Notes Wound #1 (Toe Great) Wound Laterality: Plantar, Right Cleanser Soap and Water Discharge Instruction: May shower and wash wound with dial antibacterial soap and water prior to dressing change. Peri-Wound Care Topical Primary Dressing Promogran Prisma Matrix, 4.34 (sq in) (silver collagen) Discharge Instruction: Moisten collagen with saline or hydrogel Secondary Dressing Woven Gauze Sponges 2x2 in Discharge Instruction: Apply over primary dressing as directed. Optifoam Non-Adhesive Dressing, 4x4 in Discharge Instruction: Apply over primary dressing cut to make foam donut Secured With Conforming Stretch Gauze Bandage, Sterile 2x75 (in/in) Discharge Instruction: Secure with stretch gauze as directed. Paper Tape, 1x10 (in/yd) Discharge Instruction: Secure dressing with tape as directed. Compression Wrap Compression Stockings Add-Ons Electronic Signature(s) Signed: 01/11/2021 5:32:13 PM By: [name] Gouty RN, BSN Signed: 01/11/2021 5:41:26 PM By: [name] Entered By: [name] on 01/11/2021 13:40:38 -------------------------------------------------------------------------------- Vitals Details Patient Name: Date of Service: [name] Lefevre, [name] 01/11/2021 1:45 PM Medical Record Number: [id number] Patient Account Number: [id number] Date of Birth/Sex: Treating RN: 1956/03/31 (65 y.o. [name], [name] Primary Care Revan Gendron: [name] Other Clinician: Referring Fatih Stalvey: Treating Damarea Merkel/Extender: [name] in Treatment: 1 Vital Signs Time Taken: 13:38 Temperature (F): 98.1 Pulse (bpm): 65 Respiratory Rate (breaths/min): 20 Blood Pressure (mmHg): 115/71 Capillary Blood Glucose (mg/dl): 140 Reference Range: 80 - 120 mg / dl Electronic Signature(s) Signed: 01/11/2021 5:41:26 PM By: [name] Entered By: [name] on 01/11/2021 13:39:18

## 2021-01-18 ENCOUNTER — Encounter (HOSPITAL_BASED_OUTPATIENT_CLINIC_OR_DEPARTMENT_OTHER): Payer: Medicare Other | Attending: Physician Assistant | Admitting: Physician Assistant

## 2021-01-18 ENCOUNTER — Other Ambulatory Visit: Payer: Self-pay

## 2021-01-18 DIAGNOSIS — I2699 Other pulmonary embolism without acute cor pulmonale: Secondary | ICD-10-CM | POA: Insufficient documentation

## 2021-01-18 DIAGNOSIS — E11621 Type 2 diabetes mellitus with foot ulcer: Secondary | ICD-10-CM | POA: Diagnosis not present

## 2021-01-18 DIAGNOSIS — E1151 Type 2 diabetes mellitus with diabetic peripheral angiopathy without gangrene: Secondary | ICD-10-CM | POA: Diagnosis not present

## 2021-01-18 DIAGNOSIS — I2782 Chronic pulmonary embolism: Secondary | ICD-10-CM | POA: Diagnosis not present

## 2021-01-18 DIAGNOSIS — L97512 Non-pressure chronic ulcer of other part of right foot with fat layer exposed: Secondary | ICD-10-CM | POA: Insufficient documentation

## 2021-01-18 DIAGNOSIS — Z7901 Long term (current) use of anticoagulants: Secondary | ICD-10-CM | POA: Insufficient documentation

## 2021-01-18 DIAGNOSIS — E1140 Type 2 diabetes mellitus with diabetic neuropathy, unspecified: Secondary | ICD-10-CM | POA: Insufficient documentation

## 2021-01-18 NOTE — Progress Notes (Signed)
DEQWAN, EINCK ([id number]) Visit Report for 01/18/2021 Arrival Information Details Patient Name: Date of Service: [place], [name] 01/18/2021 10:30 A M Medical Record Number: [id number] Patient Account Number: [id number] Date of Birth/Sex: Treating RN: 1955/10/20 (65 y.o. Marcheta Grammes Primary Care Ronell Duffus: [name] Other Clinician: Referring Ewel Lona: Treating Kerolos Nehme/Extender: [name] in Treatment: 2 Visit Information History Since Last Visit Added or deleted any medications: No Patient Arrived: Ambulatory Any new allergies or adverse reactions: No Arrival Time: 11:14 Had a fall or experienced change in No Transfer Assistance: None activities of daily living that may affect Patient Identification Verified: Yes risk of falls: Secondary Verification Process Completed: Yes Signs or symptoms of abuse/neglect since No Patient Requires Transmission-Based Precautions: No last visito Patient Has Alerts: Yes Hospitalized since last visit: No Patient Alerts: Patient on Blood Thinner Implantable device outside of the clinic No ABI's R=1.22 L=1.29 excluding cellular tissue based products placed in the center since last visit: Has Dressing in Place as Prescribed: Yes Has Footwear/Offloading in Place as Yes Prescribed: Right: Surgical Shoe with Pressure Relief Insole Pain Present Now: No Electronic Signature(s) Signed: 01/18/2021 5:27:26 PM By: Lorrin Jackson Entered By: Lorrin Jackson on 01/18/2021 11:17:24 -------------------------------------------------------------------------------- Encounter Discharge Information Details Patient Name: Date of Service: [name], [name] 01/18/2021 10:30 A M Medical Record Number: [id number] Patient Account Number: [id number] Date of Birth/Sex: Treating RN: [birth year] (65 y.o. Marcheta Grammes Primary Care Doralyn Kirkes: [name] Other Clinician: Referring Robecca Fulgham: Treating  Sakib Noguez/Extender: [name] in Treatment: 2 Encounter Discharge Information Items Post Procedure Vitals Discharge Condition: Stable Temperature (F): 98.7 Ambulatory Status: Ambulatory Pulse (bpm): 69 Discharge Destination: Home Respiratory Rate (breaths/min): 18 Transportation: Private Auto Blood Pressure (mmHg): 110/70 Schedule Follow-up Appointment: Yes Clinical Summary of Care: Provided on 01/18/2021 Form Type Recipient Paper Patient Patient Electronic Signature(s) Signed: 01/18/2021 1:41:22 PM By: Lorrin Jackson Entered By: Lorrin Jackson on 01/18/2021 13:41:22 -------------------------------------------------------------------------------- Lower Extremity Assessment Details Patient Name: Date of Service: [name], Angela Burke S. 01/18/2021 10:30 A M Medical Record Number: [id number] Patient Account Number: [id number] Date of Birth/Sex: Treating RN: 21-Apr-1956 (65 y.o. Marcheta Grammes Primary Care Nalu Troublefield: [name] Other Clinician: Referring Deren Degrazia: Treating Sruthi Maurer/Extender: [name] in Treatment: 2 Edema Assessment Assessed: [Left: No] [Right: Yes] Edema: [Left: Ye] [Right: s] Calf Left: Right: Point of Measurement: From Medial Instep 45 cm Ankle Left: Right: Point of Measurement: From Medial Instep 29 cm Vascular Assessment Pulses: Dorsalis Pedis Palpable: [Right:Yes] Electronic Signature(s) Signed: 01/18/2021 5:27:26 PM By: Lorrin Jackson Entered By: Lorrin Jackson on 01/18/2021 11:23:04 -------------------------------------------------------------------------------- Multi-Disciplinary Care Plan Details Patient Name: Date of Service: [name], [name] 01/18/2021 10:30 A M Medical Record Number: [id number] Patient Account Number: [id number] Date of Birth/Sex: Treating RN: [birth year] (65 y.o. [name] Primary Care Asael Pann: [name] Other Clinician: Referring  Daphney Hopke: Treating Aerionna Moravek/Extender: [name] in Treatment: 2 Multidisciplinary Care Plan reviewed with physician Active Inactive Nutrition Nursing Diagnoses: Impaired glucose control: actual or potential Potential for alteratiion in Nutrition/Potential for imbalanced nutrition Goals: Patient/caregiver will maintain therapeutic glucose control Date Initiated: 01/04/2021 Target Resolution Date: 02/01/2021 Goal Status: Active Interventions: Assess HgA1c results as ordered upon admission and as needed Assess patient nutrition upon admission and as needed per policy Provide education on elevated blood sugars and impact on wound healing Treatment Activities: Patient referred  to Primary Care Physician for further nutritional evaluation : 01/04/2021 Notes: Wound/Skin Impairment Nursing Diagnoses: Impaired tissue integrity Knowledge deficit related to ulceration/compromised skin integrity Goals: Patient/caregiver will verbalize understanding of skin care regimen Date Initiated: 01/04/2021 Target Resolution Date: 02/01/2021 Goal Status: Active Ulcer/skin breakdown will have a volume reduction of 30% by week 4 Date Initiated: 01/04/2021 Target Resolution Date: 02/01/2021 Goal Status: Active Interventions: Assess patient/caregiver ability to obtain necessary supplies Assess patient/caregiver ability to perform ulcer/skin care regimen upon admission and as needed Assess ulceration(s) every visit Provide education on ulcer and skin care Treatment Activities: Skin care regimen initiated : 01/04/2021 Topical wound management initiated : 01/04/2021 Notes: Electronic Signature(s) Signed: 01/18/2021 5:57:23 PM By: Baruch Gouty RN, BSN Entered By: Baruch Gouty on 01/18/2021 12:13:12 -------------------------------------------------------------------------------- Pain Assessment Details Patient Name: Date of Service: [name], [name] 01/18/2021 10:30 A  M Medical Record Number: [id number] Patient Account Number: [id number] Date of Birth/Sex: Treating RN: [birth year] (65 y.o. Marcheta Grammes Primary Care Lew Prout: [name] Other Clinician: Referring Ashawnti Tangen: Treating Jamear Carbonneau/Extender: [name] in Treatment: 2 Active Problems Location of Pain Severity and Description of Pain Patient Has Paino No Site Locations Pain Management and Medication Current Pain Management: Electronic Signature(s) Signed: 01/18/2021 5:27:26 PM By: Lorrin Jackson Entered By: Lorrin Jackson on 01/18/2021 11:18:11 -------------------------------------------------------------------------------- Patient/Caregiver Education Details Patient Name: Date of Service: [name], MA RSHA Natalia Leatherwood 8/3/2022andnbsp10:30 A M Medical Record Number: [id number] Patient Account Number: [id number] Date of Birth/Gender: Treating RN: May 19, 1956 (65 y.o. [name] Primary Care Physician: [name] Other Clinician: Referring Physician: Treating Physician/Extender: [name] in Treatment: 2 Education Assessment Education Provided To: Patient Education Topics Provided Elevated Blood Sugar/ Impact on Healing: Methods: Explain/Verbal Responses: Reinforcements needed, State content correctly Offloading: Methods: Explain/Verbal Responses: Reinforcements needed, State content correctly Wound/Skin Impairment: Methods: Explain/Verbal Responses: Reinforcements needed, State content correctly Electronic Signature(s) Signed: 01/18/2021 5:57:23 PM By: Baruch Gouty RN, BSN Entered By: Baruch Gouty on 01/18/2021 12:13:57 -------------------------------------------------------------------------------- Wound Assessment Details Patient Name: Date of Service: [name], [name] 01/18/2021 10:30 A M Medical Record Number: [id number] Patient Account Number: [id number] Date of Birth/Sex: Treating  RN: September 13, 1955 (65 y.o. Marcheta Grammes Primary Care Taylour Lietzke: [name] Other Clinician: Referring Jamiere Gulas: Treating Juandaniel Manfredo/Extender: [name] in Treatment: 2 Wound Status Wound Number: 1 Primary Diabetic Wound/Ulcer of the Lower Extremity Etiology: Wound Location: Right, Plantar T Great oe Wound Open Wounding Event: Other Lesion Status: Date Acquired: 06/27/2020 Comorbid Sleep Apnea, Coronary Artery Disease, Hypertension, Type II Weeks Of Treatment: 2 History: Diabetes, Osteoarthritis, Neuropathy Clustered Wound: No Photos Wound Measurements Length: (cm) 0.3 Width: (cm) 0.3 Depth: (cm) 0.3 Area: (cm) 0.071 Volume: (cm) 0.021 % Reduction in Area: 54.8% % Reduction in Volume: 73.4% Epithelialization: Medium (34-66%) Tunneling: No Undermining: No Wound Description Classification: Grade 1 Wound Margin: Well defined, not attached Exudate Amount: Medium Exudate Type: Serosanguineous Exudate Color: red, brown Foul Odor After Cleansing: No Slough/Fibrino No Wound Bed Granulation Amount: Large (67-100%) Exposed Structure Granulation Quality: Red Fascia Exposed: No Necrotic Amount: None Present (0%) Fat Layer (Subcutaneous Tissue) Exposed: Yes Tendon Exposed: No Muscle Exposed: No Joint Exposed: No Bone Exposed: No Assessment Notes Calloused periwound Treatment Notes Wound #1 (Toe Great) Wound Laterality: Plantar, Right Cleanser Soap and Water Discharge Instruction: May shower and wash wound with dial antibacterial soap and water prior to dressing change. Peri-Wound Care Topical Primary  Dressing Promogran Prisma Matrix, 4.34 (sq in) (silver collagen) Discharge Instruction: Moisten collagen with saline or hydrogel Secondary Dressing Woven Gauze Sponges 2x2 in Discharge Instruction: Apply over primary dressing as directed. Optifoam Non-Adhesive Dressing, 4x4 in Discharge Instruction: Apply over primary dressing cut to  make foam donut Secured With Conforming Stretch Gauze Bandage, Sterile 2x75 (in/in) Discharge Instruction: Secure with stretch gauze as directed. Paper Tape, 1x10 (in/yd) Discharge Instruction: Secure dressing with tape as directed. Compression Wrap Compression Stockings Add-Ons Electronic Signature(s) Signed: 01/18/2021 5:27:26 PM By: Lorrin Jackson Entered By: Lorrin Jackson on 01/18/2021 11:22:35 -------------------------------------------------------------------------------- Vitals Details Patient Name: Date of Service: [name], [name] 01/18/2021 10:30 A M Medical Record Number: [id number] Patient Account Number: [id number] Date of Birth/Sex: Treating RN: [birth year] (65 y.o. Marcheta Grammes Primary Care Jawann Urbani: [name] Other Clinician: Referring Demetress Tift: Treating Ikea Demicco/Extender: [name] in Treatment: 2 Vital Signs Time Taken: 11:17 Temperature (F): 98.7 Pulse (bpm): 69 Respiratory Rate (breaths/min): 18 Blood Pressure (mmHg): 110/70 Capillary Blood Glucose (mg/dl): 136 Reference Range: 80 - 120 mg / dl Electronic Signature(s) Signed: 01/18/2021 5:27:26 PM By: Lorrin Jackson Entered By: Lorrin Jackson on 01/18/2021 11:17:53

## 2021-01-18 NOTE — Progress Notes (Addendum)
RYOMA, PACKETT ([id number]) Visit Report for 01/18/2021 Chief Complaint Document Details Patient Name: Date of Service: [place], [name] 01/18/2021 10:30 A M Medical Record Number: [id number] Patient Account Number: [id number] Date of Birth/Sex: Treating RN: 07-22-1955 (65 y.o. [name] Primary Care Provider: [name] Other Clinician: Referring Provider: Treating Provider/Extender: [name] in Treatment: 2 Information Obtained from: Patient Chief Complaint Right great toe ulcer Electronic Signature(s) Signed: 01/18/2021 11:30:57 AM By: Worthy Keeler PA-C Entered By: Worthy Keeler on 01/18/2021 11:30:56 -------------------------------------------------------------------------------- Debridement Details Patient Name: Date of Service: [name], [place] S. 01/18/2021 10:30 A M Medical Record Number: [id number] Patient Account Number: [id number] Date of Birth/Sex: Treating RN: 10/08/1955 (65 y.o. [name] Primary Care Provider: [name] Other Clinician: Referring Provider: Treating Provider/Extender: [name] in Treatment: 2 Debridement Performed for Assessment: Wound #1 Right,Plantar T Great oe Performed By: Physician Worthy Keeler, PA Debridement Type: Debridement Severity of Tissue Pre Debridement: Fat layer exposed Level of Consciousness (Pre-procedure): Awake and Alert Pre-procedure Verification/Time Out Yes - 12:10 Taken: Start Time: 12:12 T Area Debrided (L x W): otal 0.5 (cm) x 0.5 (cm) = 0.25 (cm) Tissue and other material debrided: Non-Viable, Callus, Slough, Subcutaneous, Skin: Epidermis, Slough Level: Skin/Subcutaneous Tissue Debridement Description: Excisional Instrument: Curette Bleeding: Minimum Hemostasis Achieved: Pressure End Time: 12:17 Procedural Pain: 0 Post Procedural Pain: 0 Response to Treatment: Procedure was tolerated well Level of Consciousness  (Post- Awake and Alert procedure): Post Debridement Measurements of Total Wound Length: (cm) 0.4 Width: (cm) 0.4 Depth: (cm) 0.1 Volume: (cm) 0.013 Character of Wound/Ulcer Post Debridement: Improved Severity of Tissue Post Debridement: Fat layer exposed Post Procedure Diagnosis Same as Pre-procedure Electronic Signature(s) Signed: 01/18/2021 5:22:07 PM By: Worthy Keeler PA-C Signed: 01/18/2021 5:57:23 PM By: Baruch Gouty RN, BSN Entered By: Baruch Gouty on 01/18/2021 12:16:54 -------------------------------------------------------------------------------- HPI Details Patient Name: Date of Service: [name], [name] 01/18/2021 10:30 A M Medical Record Number: [id number] Patient Account Number: [id number] Date of Birth/Sex: Treating RN: December 15, 1955 (65 y.o. [name] Primary Care Provider: [name] Other Clinician: Referring Provider: Treating Provider/Extender: [name] in Treatment: 2 History of Present Illness HPI Description: 01/04/2021 upon evaluation today patient presents for initial evaluation here in our clinic concerning issues that he has been having with a plantar toe ulceration of the right foot. He tells me this has been going on since around January 2022 when he was on a trip to [place]. Subsequently following he then went to [place] where he had that received care multiple times during the time he was in [place]. He states that [place] seem to be the best as far as that was concerned. With that being said he was initially placed on clindamycin, then doxycycline during the course of this which he feels like did not do as well, and then subsequently he has been on clindamycin which she has 4 days left of at this point. No one has cultured anything up to this point. He has been seeing podiatry and currently Dr. Earleen Newport is to he is seeing. With that being said he does have x-rays that were really not conclusive for any signs  of osteomyelitis. He does have an MRI scheduled for this coming Friday which is just 2 days away. He did have ABIs which were performed on 12/29/2020 and subsequently this did reveal that  he has excellent arterial flow with his findings being normal pretty much across the board. I am very pleased in that regard. His ABI on the right was 1.22 with a TBI of 0.93 and on the left was 1.29 with a TBI of 1.08. Waveforms were triphasic throughout. The patient tells me he is currently been cleaning this with either alcohol or peroxide and then applying Silvadene cream to this and a small dab and wrapping this. 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 Otherwise he is a type II diabetic. 01/11/2021 upon evaluation today patient appears to be doing well with regard to his wound. He is showing signs of improvement which is great news and overall very pleased with where things stand today. There does not appear to be any evidence of active infection at this time which is great news as well. In general I think that the patient is on the right track. His MRI which was actually ordered by Dr. Earleen Newport was negative for any signs of osteomyelitis that is great news. Overall I think that this is going ahead on the right direction with the appropriate care. 01/18/2021 upon evaluation today patient appears to be doing well currently in regard to his toe ulcer. There is a little bit of callus around the edges of the wound but nothing nearly as significant as what we previously noted. Fortunately there does not appear to be any signs of infection either which is also good news. I think that the patient is headed in the appropriate direction. Electronic Signature(s) Signed: 01/18/2021 1:32:36 PM By: Worthy Keeler PA-C Entered By: Worthy Keeler on 01/18/2021  13:32:35 -------------------------------------------------------------------------------- Physical Exam Details Patient Name: Date of Service: [name], [place] S. 01/18/2021 10:30 A M Medical Record Number: [id number] Patient Account Number: [id number] Date of Birth/Sex: Treating RN: 02/03/1956 (65 y.o. [name] Primary Care Provider: [name] Other Clinician: Referring Provider: Treating Provider/Extender: [name] in Treatment: 2 Constitutional Well-nourished and well-hydrated in no acute distress. Respiratory normal breathing without difficulty. Psychiatric this patient is able to make decisions and demonstrates good insight into disease process. Alert and Oriented x 3. pleasant and cooperative. Notes Upon inspection patient's wound bed showed signs of good granulation epithelization at this point. I do not see any signs of infection which is great news and overall I am extremely pleased with where we stand today. From the beginning this is definitely showing signs of improvement and after debridement today this looked even better and there was really no significant bleeding which was great news as well. Electronic Signature(s) Signed: 01/18/2021 1:32:58 PM By: Worthy Keeler PA-C Entered By: Worthy Keeler on 01/18/2021 13:32:58 -------------------------------------------------------------------------------- Physician Orders Details Patient Name: Date of Service: [name], [place] [name] 01/18/2021 10:30 A M Medical Record Number: [id number] Patient Account Number: [id number] Date of Birth/Sex: Treating RN: 12/26/1955 (65 y.o. [name] Primary Care Provider: [name] Other Clinician: Referring Provider: Treating Provider/Extender: [name] in Treatment: 2 Verbal / Phone Orders: No Diagnosis Coding ICD-10 Coding Code Description E11.621 Type 2 diabetes mellitus with foot ulcer L97.512  Non-pressure chronic ulcer of other part of right foot with fat layer exposed Z79.01 Long term (current) use of anticoagulants I27.82 Chronic pulmonary embolism Follow-up Appointments ppointment in 2 weeks. -  with Margarita Grizzle Return A Bathing/ Shower/ Hygiene May shower with protection but do not get wound dressing(s) wet. - on days that dressing is not changed May shower and wash wound with soap and water. Off-Loading Wound #1 Right,Plantar T Great oe Open toe surgical shoe to: - right foot Wound Treatment Wound #1 - T Great oe Wound Laterality: Plantar, Right Cleanser: Soap and Water Every Other Day/15 Days Discharge Instructions: May shower and wash wound with dial antibacterial soap and water prior to dressing change. Prim Dressing: Promogran Prisma Matrix, 4.34 (sq in) (silver collagen) Every Other Day/15 Days ary Discharge Instructions: Moisten collagen with saline or hydrogel Secondary Dressing: Woven Gauze Sponges 2x2 in (Generic) Every Other Day/15 Days Discharge Instructions: Apply over primary dressing as directed. Secondary Dressing: Optifoam Non-Adhesive Dressing, 4x4 in (Generic) Every Other Day/15 Days Discharge Instructions: Apply over primary dressing cut to make foam donut Secured With: Conforming Stretch Gauze Bandage, Sterile 2x75 (in/in) (Generic) Every Other Day/15 Days Discharge Instructions: Secure with stretch gauze as directed. Secured With: Paper Tape, 1x10 (in/yd) (Generic) Every Other Day/15 Days Discharge Instructions: Secure dressing with tape as directed. Electronic Signature(s) Signed: 01/18/2021 5:22:07 PM By: Worthy Keeler PA-C Signed: 01/18/2021 5:57:23 PM By: Baruch Gouty RN, BSN Entered By: Baruch Gouty on 01/18/2021 12:18:08 -------------------------------------------------------------------------------- Problem List Details Patient Name: Date of Service: [name], [name] 01/18/2021 10:30 A M Medical Record Number: [id number] Patient  Account Number: [id number] Date of Birth/Sex: Treating RN: 1956/02/23 (65 y.o. [name] Primary Care Provider: [name] Other Clinician: Referring Provider: Treating Provider/Extender: [name] in Treatment: 2 Active Problems ICD-10 Encounter Code Description Active Date MDM Diagnosis E11.621 Type 2 diabetes mellitus with foot ulcer 01/04/2021 No Yes L97.512 Non-pressure chronic ulcer of other part of right foot with fat layer exposed 01/04/2021 No Yes Z79.01 Long term (current) use of anticoagulants 01/04/2021 No Yes I27.82 Chronic pulmonary embolism 01/04/2021 No Yes Inactive Problems Resolved Problems Electronic Signature(s) Signed: 01/18/2021 11:30:50 AM By: Worthy Keeler PA-C Entered By: Worthy Keeler on 01/18/2021 11:30:49 -------------------------------------------------------------------------------- Progress Note Details Patient Name: Date of Service: [name], [place] S. 01/18/2021 10:30 A M Medical Record Number: [id number] Patient Account Number: [id number] Date of Birth/Sex: Treating RN: [birth year] (65 y.o. [name] Primary Care Provider: [name] Other Clinician: Referring Provider: Treating Provider/Extender: [name] in Treatment: 2 Subjective Chief Complaint Information obtained from Patient Right great toe ulcer History of Present Illness (HPI) 01/04/2021 upon evaluation today patient presents for initial evaluation here in our clinic concerning issues that he has been having with a plantar toe ulceration of the right foot. He tells me this has been going on since around January 2022 when he was on a trip to [place]. Subsequently following he then went to [place] where he had that received care multiple times during the time he was in [place]. He states that [place] seem to be the best as far as that was concerned. With that being said he was initially placed on  clindamycin, then doxycycline during the course of this which he feels like did not do as well, and then subsequently he has been on clindamycin which she has 4 days left of at this point. No one has cultured anything up to this point. He has been seeing podiatry and currently Dr. Earleen Newport is to he is seeing. With that being said he does have x-rays that were  really not conclusive for any signs of osteomyelitis. He does have an MRI scheduled for this coming Friday which is just 2 days away. He did have ABIs which were performed on 12/29/2020 and subsequently this did reveal that he has excellent arterial flow with his findings being normal pretty much across the board. I am very pleased in that regard. His ABI on the right was 1.22 with a TBI of 0.93 and on the left was 1.29 with a TBI of 1.08. Waveforms were triphasic throughout. The patient tells me he is currently been cleaning this with either alcohol or peroxide and then applying Silvadene cream to this and a small dab and wrapping this. 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 Otherwise he is a type II diabetic. 01/11/2021 upon evaluation today patient appears to be doing well with regard to his wound. He is showing signs of improvement which is great news and overall very pleased with where things stand today. There does not appear to be any evidence of active infection at this time which is great news as well. In general I think that the patient is on the right track. His MRI which was actually ordered by Dr. Earleen Newport was negative for any signs of osteomyelitis that is great news. Overall I think that this is going ahead on the right direction with the appropriate care. 01/18/2021 upon evaluation today patient appears to be doing well currently in regard to his toe ulcer. There is a little bit of callus around the edges of the  wound but nothing nearly as significant as what we previously noted. Fortunately there does not appear to be any signs of infection either which is also good news. I think that the patient is headed in the appropriate direction. Objective Constitutional Well-nourished and well-hydrated in no acute distress. Vitals Time Taken: 11:17 AM, Temperature: 98.7 F, Pulse: 69 bpm, Respiratory Rate: 18 breaths/min, Blood Pressure: 110/70 mmHg, Capillary Blood Glucose: 136 mg/dl. Respiratory normal breathing without difficulty. Psychiatric this patient is able to make decisions and demonstrates good insight into disease process. Alert and Oriented x 3. pleasant and cooperative. General Notes: Upon inspection patient's wound bed showed signs of good granulation epithelization at this point. I do not see any signs of infection which is great news and overall I am extremely pleased with where we stand today. From the beginning this is definitely showing signs of improvement and after debridement today this looked even better and there was really no significant bleeding which was great news as well. Integumentary (Hair, Skin) Wound #1 status is Open. Original cause of wound was Other Lesion. The date acquired was: 06/27/2020. The wound has been in treatment 2 weeks. The wound is located on the [employer]. The wound measures 0.3cm length x 0.3cm width x 0.3cm depth; 0.071cm^2 area and 0.021cm^3 volume. There is oe Fat Layer (Subcutaneous Tissue) exposed. There is no tunneling or undermining noted. There is a medium amount of serosanguineous drainage noted. The wound margin is well defined and not attached to the wound base. There is large (67-100%) red granulation within the wound bed. There is no necrotic tissue within the wound bed. General Notes: Calloused periwound Assessment Active Problems ICD-10 Type 2 diabetes mellitus with foot ulcer Non-pressure chronic ulcer of other part of right  foot with fat layer exposed Long term (current) use of anticoagulants  Chronic pulmonary embolism Procedures Wound #1 Pre-procedure diagnosis of Wound #1 is a Diabetic Wound/Ulcer of the Lower Extremity located on the Right,Plantar T Great .Severity of Tissue Pre oe Debridement is: Fat layer exposed. There was a Excisional Skin/Subcutaneous Tissue Debridement with a total area of 0.25 sq cm performed by Worthy Keeler, PA. With the following instrument(s): Curette to remove Non-Viable tissue/material. Material removed includes Callus, Subcutaneous Tissue, Slough, and Skin: Epidermis. No specimens were taken. A time out was conducted at 12:10, prior to the start of the procedure. A Minimum amount of bleeding was controlled with Pressure. The procedure was tolerated well with a pain level of 0 throughout and a pain level of 0 following the procedure. Post Debridement Measurements: 0.4cm length x 0.4cm width x 0.1cm depth; 0.013cm^3 volume. Character of Wound/Ulcer Post Debridement is improved. Severity of Tissue Post Debridement is: Fat layer exposed. Post procedure Diagnosis Wound #1: Same as Pre-Procedure Plan Follow-up Appointments: Return Appointment in 2 weeks. - with Glynn Octave Shower/ Hygiene: May shower with protection but do not get wound dressing(s) wet. - on days that dressing is not changed May shower and wash wound with soap and water. Off-Loading: Wound #1 Right,Plantar T Great: oe Open toe surgical shoe to: - right foot WOUND #1: - T Great Wound Laterality: Plantar, Right oe Cleanser: Soap and Water Every Other Day/15 Days Discharge Instructions: May shower and wash wound with dial antibacterial soap and water prior to dressing change. Prim Dressing: Promogran Prisma Matrix, 4.34 (sq in) (silver collagen) Every Other Day/15 Days ary Discharge Instructions: Moisten collagen with saline or hydrogel Secondary Dressing: Woven Gauze Sponges 2x2 in (Generic) Every Other  Day/15 Days Discharge Instructions: Apply over primary dressing as directed. Secondary Dressing: Optifoam Non-Adhesive Dressing, 4x4 in (Generic) Every Other Day/15 Days Discharge Instructions: Apply over primary dressing cut to make foam donut Secured With: Conforming Stretch Gauze Bandage, Sterile 2x75 (in/in) (Generic) Every Other Day/15 Days Discharge Instructions: Secure with stretch gauze as directed. Secured With: Paper T ape, 1x10 (in/yd) (Generic) Every Other Day/15 Days Discharge Instructions: Secure dressing with tape as directed. 1. I would recommend that we going continue with wound care measures as before I am also can recommend that we have the patient continue to and the patient is in agreement with plan. We are using the silver collagen which I think has done a great job up to this point. 2. I am also can recommend that we have the patient continue with offloading is much as possible. We are using a doughnut dressing to try to offload is much as we can and he supposed to be getting custom shoes as well which I think will help hopefully with offloading and pressure relief as well as friction management. We will see patient back for reevaluation in 1 week here in the clinic. If anything worsens or changes patient will contact our office for additional recommendations. Electronic Signature(s) Signed: 01/18/2021 1:33:50 PM By: Worthy Keeler PA-C Entered By: Worthy Keeler on 01/18/2021 13:33:50 -------------------------------------------------------------------------------- SuperBill Details Patient Name: Date of Service: [name], [place] S. 01/18/2021 Medical Record Number: [id number] Patient Account Number: [id number] Date of Birth/Sex: Treating RN: 1956-04-05 (65 y.o. [name] Primary Care Provider: [name] Other Clinician: Referring Provider: Treating Provider/Extender: [name] in Treatment: 2 Diagnosis Coding ICD-10  Codes Code Description [phone] Type 2 diabetes mellitus with foot ulcer L97.512 Non-pressure chronic ulcer of other part of right foot with  fat layer exposed Z79.01 Long term (current) use of anticoagulants I27.82 Chronic pulmonary embolism Facility Procedures Physician Procedures : CPT4 Code Description Modifier [id number] - WC PHYS SUBQ TISS 20 SQ CM ICD-10 Diagnosis Description L97.512 Non-pressure chronic ulcer of other part of right foot with fat layer exposed Quantity: 1 Electronic Signature(s) Signed: 01/18/2021 1:34:04 PM By: Worthy Keeler PA-C Entered By: Worthy Keeler on 01/18/2021 13:34:04

## 2021-01-30 ENCOUNTER — Ambulatory Visit (INDEPENDENT_AMBULATORY_CARE_PROVIDER_SITE_OTHER): Payer: Medicare Other

## 2021-01-30 ENCOUNTER — Other Ambulatory Visit: Payer: Self-pay

## 2021-01-30 DIAGNOSIS — E1149 Type 2 diabetes mellitus with other diabetic neurological complication: Secondary | ICD-10-CM

## 2021-01-30 NOTE — Progress Notes (Signed)
Patient in office today for diabetic shoe measurement and custom inserts. Patient wears a size 11.5 wide men's shoe. Provider treating patient's diabetes at this time is Dr. Darron Doom. Patient selected [id number] Apex classic loafer as his 1st choice and selected [id number] Apex bolt athletic knit as the back-up pair. Patient was advised that office will call when shoes are available for pick-up.

## 2021-01-31 NOTE — Progress Notes (Signed)
Cardiology Office Note:    Date:  02/01/2021   ID:  [name], DOB 06-29-1955, MRN [id number]  PCP:  [name], MD   [facility] Providers Cardiologist:  Werner Lean, MD     Referring MD: [name], MD   CC: Lack of energy; HF follow up  History of Present Illness:    [name] is a 65 y.o. male with a hx of mild non obstructive CAD, AAA,Morbid Obesity, DM, and HTN; OSA on CPAP COVID-19 2021 who presents for evaluation 10/21/20.  In interim of this visit, patient started on lasix with some improved symptoms.  Echo pending.  Seen 02/01/21  Patient notes that he is doing ok.  Since last visit notes that he has knee problems. Has had multiple MRIs of his foot or knees.  Has pulmonary follow up tomorrow.  No chest pain or pressure. Since we some still have a fair amount of short windedness and no PND/Orthopnea.  No weight gain.  Leg swelling has improved but persists.  No palpitations or syncope.  Has bilateral knee pain and and has no association of this with his statin (long time).   Past Medical History:  Diagnosis Date   Abdominal aortic aneurysm (AAA), 30-34 mm diameter ([place]) 06/01/2017   Nml w/ greatest dm 3 cm US Aorta 06/2016, Abd [place] 05/01/2017 showed infrarenal dilatation at 3.4 cm - rec repeat US in 3 yrs.   Arthritis    Cervical disc disease 12/26/2011   Coronary artery disease involving native coronary artery of native heart without angina pectoris 12/01/2016   He had coronary angiography in 2011. There was irregularity within the LAD with up to 30% narrowing. The myocardial perfusion imaging done 11/07/2015 did not demonstrate any evidence of ischemia with a low risk nuclear stress test other than EF estimated at 47%. Chest CT 01/25/2016 showed diffuse coronary artery calcifications with heavy calcifications in the LAD. Pt seen by cardiology Dr. Linard Millers    Diabetes mellitus    Difficult intubation 04/05/2014   Dyslipidemia    Family  history of anesthesia complication    pt states took days for his father to awaken after mask was used    Fatty liver 06/01/2017   [place] 05/01/2017   GERD (gastroesophageal reflux disease)    has been having acid reflux since recent endoscopy    H/O hiatal hernia    repair with lap band, now has ventral hernia midline abd   Hearing loss-aides 03/26/2012   Hyperlipidemia    Hypertension    Morbid obesity ([place])    OSA (obstructive sleep apnea)    Rotator cuff tear    Seasonal allergies    Shortness of breath    pt states related to high BP meds with extended walking or climbling stairs   Sleep apnea     Past Surgical History:  Procedure Laterality Date   CARDIAC CATHETERIZATION     approx 3 years ago    COLONOSCOPY WITH PROPOFOL N/A 03/10/2015   Procedure: COLONOSCOPY WITH PROPOFOL;  Surgeon: Milus Banister, MD;  Location: [facility];  Service: Endoscopy;  Laterality: N/A;   colonscopy      2012   ESOPHAGOGASTRODUODENOSCOPY (EGD) WITH PROPOFOL N/A 03/10/2015   Procedure: ESOPHAGOGASTRODUODENOSCOPY (EGD) WITH PROPOFOL;  Surgeon: Milus Banister, MD;  Location: [facility];  Service: Endoscopy;  Laterality: N/A;   HERNIA REPAIR     with gastric banding   LAPAROSCOPIC GASTRIC BANDING N/A 04/05/2014  Procedure: LAPAROSCOPIC GASTRIC BANDING;  Surgeon: Pedro Earls, MD;  Location: [facility];  Service: General;  Laterality: N/A;   MENISCUS REPAIR Left    left knee torn meniscus   VASECTOMY      Current Medications: No outpatient medications have been marked as taking for the 02/01/21 encounter (Office Visit) with Werner Lean, MD.     Allergies:   Erythromycin, Penicillins, and Quercus robur   Social History   Socioeconomic History   Marital status: Married    Spouse name: Not on file   Number of children: 2   Years of education: Not on file   Highest education level: Not on file  Occupational History   Occupation: owner    Comment: [employer]  Tobacco  Use   Smoking status: Former    Packs/day: 4.00    Years: 20.00    Pack years: 80.00    Types: Cigarettes    Start date: 07/19/1973    Quit date: 12/13/1991    Years since quitting: 29.1   Smokeless tobacco: Never  Vaping Use   Vaping Use: Never used  Substance and Sexual Activity   Alcohol use: Yes    Alcohol/week: 3.0 - 5.0 standard drinks    Types: 3 - 5 Glasses of wine per week    Comment: takes 3 to 5 glasses of wine nightly    Drug use: No   Sexual activity: Not on file  Other Topics Concern   Not on file  Social History Narrative   Not on file   Social Determinants of Health   Financial Resource Strain: Not on file  Food Insecurity: Not on file  Transportation Needs: Not on file  Physical Activity: Not on file  Stress: Not on file  Social Connections: Not on file    Social: I took care of his wife Margarita Grizzle as well; 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 There is no history of Colon cancer. History of coronary artery disease notable for no members. History of heart failure notable for no members. History of arrhythmia notable for no members.  ROS:   Please see the history of present illness.     All other systems reviewed and are negative.  EKGs/Labs/Other Studies Reviewed:    The following studies were reviewed today:  EKG:   10/21/20: Sinus bradycardia rate 55 1st HB  NonCardiac CT: Date: 01/25/2016 Results: Aortic Atherosclerosis 3VD Calcification with LAD predominance  OSH CTPE  DATE 12/16/20 The ascending thoracic aorta measures 3.6 x 3.5 cm in transverse by AP dimension. The descending thoracic aorta measures 2.6 x 2.6 cm in transverse by AP dimension. There is atherosclerotic disease of the thoracic aorta and coronary arteries without evidence of focal intimal irregularity.   There is a  central pulmonary arterial filling defect in the right lower lobe segmental pulmonary artery (series 4, image 43). No CT evidence of right heart strain. No pericardial effusion or bulky mediastinal adenopathy.   No focal consolidation. No pleural effusion or pneumothorax.   Gastric band is present. There is a cyst dorsally seen in the upper pole the right kidney. The visualized portions of the upper abdomen are otherwiseunremarkable.  NM Stress Testing : Date: 11/08/2015 Results: Nuclear stress EF: 47%. The left ventricular ejection fraction is mildly decreased (45-54%). There was no ST segment deviation noted during stress. This  is a low risk study. There is no evidence of ischemia or previous infarction  Abdominal Aortic Duplex Date: 04/17/2019 Abdominal Aorta: There is evidence of abnormal dilatation of the distal  Abdominal aorta. The largest aortic measurement is 2.9 cm. The largest  aortic diameter remains essentially unchanged compared to prior exam.  Previous diameter measurement was 3.0 cm  obtained on 06/2016.   Left/Right Heart Catheterizations: [id number] Results: Mild obstructive only per chart review.   Recent Labs: 10/21/2020: NT-Pro BNP 18 12/27/2020: BUN 15; Creatinine, Ser 1.19; Potassium 3.9; Sodium 139  Recent Lipid Panel    Component Value Date/Time   CHOL 147 08/15/2018 1001   TRIG 194 (H) 08/15/2018 1001   HDL 54 08/15/2018 1001   CHOLHDL 2.7 08/15/2018 1001   CHOLHDL 5.2 (H) 09/07/2015 1802   VLDL NOT CALC 09/07/2015 1802   LDLCALC 54 08/15/2018 1001   Physical Exam:    VS:  BP 114/68   Pulse 73   Ht 6' (1.829 m)   Wt 300 lb (136.1 kg)   SpO2 95%   BMI 40.69 kg/m     Wt Readings from Last 3 Encounters:  02/01/21 300 lb (136.1 kg)  12/20/20 300 lb (136.1 kg)  10/21/20 (!) 303 lb (137.4 kg)    GEN: Obese male, well developed in no acute distress HEENT: Normal NECK: No JVD LYMPHATICS: No lymphadenopathy CARDIAC: RRR, no murmurs, rubs,  gallops RESPIRATORY:  Slight crackles in the bases poor air movement ABDOMEN: Soft, non-tender, distended with abdominal hernia MUSCULOSKELETAL:  +1 edema bilaterally; No deformity  SKIN: Warm and dry NEUROLOGIC:  Alert and oriented x 3 PSYCHIATRIC:  Normal affect   ASSESSMENT:    1. Heart failure, type unknown ([place])   2. Abdominal aortic aneurysm (AAA), 30-34 mm diameter ([facility])   3. Primary hypertension   4. Aortic atherosclerosis ([place])   5. OSA (obstructive sleep apnea)   6. BMI 40.0-44.9, adult ([place])   7. History of pulmonary embolus (PE)      PLAN:    In order of problems listed above:  HF NOS CAD; mild non obstructive in 2011; LAD disease, Asymptomatic Aortic atherosclerosis AAA - 30 mm (vs ULN)- consider imaging in the future visit Morbid Obesity, DM, and HTN;  OSA on CPAP Subacute PE (unclear provocation) - Will reorder Echo - will get safety labs and may potentially increase from lasix 40 mg PO Daily (taken per patient) to 80 mg PO Daily - lisinopril 40 mg PO daily - continue K pending BMP today - continue Xarelto - continue statin - bradycardia resolved off of metoprolol 100 mg PO   Three month follow up unless new symptoms or abnormal test results warranting change in plan     Medication Adjustments/Labs and Tests Ordered: Current medicines are reviewed at length with the patient today.  Concerns regarding medicines are outlined above.  Orders Placed This Encounter  Procedures   Magnesium   Basic metabolic panel   ECHOCARDIOGRAM COMPLETE    No orders of the defined types were placed in this encounter.   Patient Instructions  Medication Instructions:  Your physician recommends that you continue on your current medications as directed. Please refer to the Current Medication list given to you today.  *If you need a refill on your cardiac medications before your next appointment, please call your pharmacy*   Lab Work: TODAY: BMP, Mg If you have  labs (blood work) drawn today and your tests are completely normal, you will receive your results only  by: MyChart Message (if you have MyChart) OR A paper copy in the mail If you have any lab test that is abnormal or we need to change your treatment, we will call you to review the results.   Testing/Procedures: Your physician has requested that you have an echocardiogram. Echocardiography is a painless test that uses sound waves to create images of your heart. It provides your doctor with information about the size and shape of your heart and how well your heart's chambers and valves are working. This procedure takes approximately one hour. There are no restrictions for this procedure.    Follow-Up: At [facility], you and your health needs are our priority.  As part of our continuing mission to provide you with exceptional heart care, we have created designated Provider Care Teams.  These Care Teams include your primary Cardiologist (physician) and Advanced Practice Providers (APPs -  Physician Assistants and Nurse Practitioners) who all work together to provide you with the care you need, when you need it.   Your next appointment:   3-4  month(s)  The format for your next appointment:   In Person  Provider:   You may see Werner Lean, MD or one of the following Advanced Practice Providers on your designated Care Team:   Melina Copa, PA-C Ermalinda Barrios, PA-C     Signed, Werner Lean, MD  02/01/2021 2:45 PM    [place]

## 2021-02-01 ENCOUNTER — Ambulatory Visit (INDEPENDENT_AMBULATORY_CARE_PROVIDER_SITE_OTHER): Payer: Medicare Other | Admitting: Internal Medicine

## 2021-02-01 ENCOUNTER — Other Ambulatory Visit: Payer: Self-pay

## 2021-02-01 ENCOUNTER — Encounter (HOSPITAL_BASED_OUTPATIENT_CLINIC_OR_DEPARTMENT_OTHER): Payer: Medicare Other | Admitting: Physician Assistant

## 2021-02-01 ENCOUNTER — Encounter: Payer: Self-pay | Admitting: Internal Medicine

## 2021-02-01 VITALS — BP 114/68 | HR 73 | Ht 72.0 in | Wt 300.0 lb

## 2021-02-01 DIAGNOSIS — E11621 Type 2 diabetes mellitus with foot ulcer: Secondary | ICD-10-CM | POA: Diagnosis not present

## 2021-02-01 DIAGNOSIS — I700 Atherosclerosis of aorta: Secondary | ICD-10-CM | POA: Diagnosis not present

## 2021-02-01 DIAGNOSIS — Z6841 Body Mass Index (BMI) 40.0 and over, adult: Secondary | ICD-10-CM

## 2021-02-01 DIAGNOSIS — I714 Abdominal aortic aneurysm, without rupture, unspecified: Secondary | ICD-10-CM

## 2021-02-01 DIAGNOSIS — I5030 Unspecified diastolic (congestive) heart failure: Secondary | ICD-10-CM | POA: Insufficient documentation

## 2021-02-01 DIAGNOSIS — Z86711 Personal history of pulmonary embolism: Secondary | ICD-10-CM

## 2021-02-01 DIAGNOSIS — I10 Essential (primary) hypertension: Secondary | ICD-10-CM

## 2021-02-01 DIAGNOSIS — I509 Heart failure, unspecified: Secondary | ICD-10-CM | POA: Diagnosis not present

## 2021-02-01 DIAGNOSIS — I2510 Atherosclerotic heart disease of native coronary artery without angina pectoris: Secondary | ICD-10-CM

## 2021-02-01 DIAGNOSIS — G4733 Obstructive sleep apnea (adult) (pediatric): Secondary | ICD-10-CM

## 2021-02-01 NOTE — Patient Instructions (Signed)
Medication Instructions:  Your physician recommends that you continue on your current medications as directed. Please refer to the Current Medication list given to you today.  *If you need a refill on your cardiac medications before your next appointment, please call your pharmacy*   Lab Work: TODAY: BMP, Mg If you have labs (blood work) drawn today and your tests are completely normal, you will receive your results only by: [place] (if you have MyChart) OR A paper copy in the mail If you have any lab test that is abnormal or we need to change your treatment, we will call you to review the results.   Testing/Procedures: Your physician has requested that you have an echocardiogram. Echocardiography is a painless test that uses sound waves to create images of your heart. It provides your doctor with information about the size and shape of your heart and how well your heart's chambers and valves are working. This procedure takes approximately one hour. There are no restrictions for this procedure.    Follow-Up: At [facility], you and your health needs are our priority.  As part of our continuing mission to provide you with exceptional heart care, we have created designated Provider Care Teams.  These Care Teams include your primary Cardiologist (physician) and Advanced Practice Providers (APPs -  Physician Assistants and Nurse Practitioners) who all work together to provide you with the care you need, when you need it.   Your next appointment:   3-4  month(s)  The format for your next appointment:   In Person  Provider:   You may see Werner Lean, MD or one of the following Advanced Practice Providers on your designated Care Team:   Melina Copa, PA-C Ermalinda Barrios, PA-C

## 2021-02-01 NOTE — Progress Notes (Signed)
DAY[place], ROPP ([id number]) Visit Report for 02/01/2021 Chief Complaint Document Details Patient Name: Date of Service: [place], [name] 02/01/2021 8:30 A M Medical Record Number: [id number] Patient Account Number: [id number] Date of Birth/Sex: Treating RN: [birth year] (65 y.o. M) Primary Care Provider: [name] Other Clinician: Referring Provider: Treating Provider/Extender: [name] in Treatment: 4 Information Obtained from: Patient Chief Complaint Right great toe ulcer Electronic Signature(s) Signed: 02/01/2021 9:03:43 AM By: Worthy Keeler PA-C Entered By: Worthy Keeler on 02/01/2021 09:03:43 -------------------------------------------------------------------------------- Debridement Details Patient Name: Date of Service: [name], [name] 02/01/2021 8:30 A M Medical Record Number: [id number] Patient Account Number: [id number] Date of Birth/Sex: Treating RN: 08-10-1955 (65 y.o. Burnadette Pop, Lauren Primary Care Provider: [name] Other Clinician: Referring Provider: Treating Provider/Extender: [name] in Treatment: 4 Debridement Performed for Assessment: Wound #1 Right,Plantar T Great oe Performed By: Physician Worthy Keeler, PA Debridement Type: Debridement Severity of Tissue Pre Debridement: Fat layer exposed Level of Consciousness (Pre-procedure): Awake and Alert Pre-procedure Verification/Time Out Yes - 09:14 Taken: Start Time: 09:14 Pain Control: Lidocaine T Area Debrided (L x W): otal 0.3 (cm) x 0.7 (cm) = 0.21 (cm) Tissue and other material debrided: Viable, Non-Viable, Callus, Subcutaneous, Skin: Dermis , Skin: Epidermis Level: Skin/Subcutaneous Tissue Debridement Description: Excisional Instrument: Curette Bleeding: Minimum Hemostasis Achieved: Pressure End Time: 09:14 Procedural Pain: 0 Post Procedural Pain: 0 Response to Treatment: Procedure was tolerated  well Level of Consciousness (Post- Awake and Alert procedure): Post Debridement Measurements of Total Wound Length: (cm) 0.3 Width: (cm) 0.7 Depth: (cm) 0.3 Volume: (cm) 0.049 Character of Wound/Ulcer Post Debridement: Improved Severity of Tissue Post Debridement: Fat layer exposed Post Procedure Diagnosis Same as Pre-procedure Electronic Signature(s) Signed: 02/01/2021 4:56:53 PM By: Worthy Keeler PA-C Signed: 02/01/2021 5:20:51 PM By: Rhae Hammock RN Entered By: Rhae Hammock on 02/01/2021 09:15:30 -------------------------------------------------------------------------------- HPI Details Patient Name: Date of Service: [name], [name] 02/01/2021 8:30 A M Medical Record Number: [id number] Patient Account Number: [id number] Date of Birth/Sex: Treating RN: [birth year] (65 y.o. M) Primary Care Provider: [name] Other Clinician: Referring Provider: Treating Provider/Extender: [name] in Treatment: 4 History of Present Illness HPI Description: 01/04/2021 upon evaluation today patient presents for initial evaluation here in our clinic concerning issues that he has been having with a plantar toe ulceration of the right foot. He tells me this has been going on since around January 2022 when he was on a trip to [place]. Subsequently following he then went to [place] where he had that received care multiple times during the time he was in [place]. He states that [place] seem to be the best as far as that was concerned. With that being said he was initially placed on clindamycin, then doxycycline during the course of this which he feels like did not do as well, and then subsequently he has been on clindamycin which she has 4 days left of at this point. No one has cultured anything up to this point. He has been seeing podiatry and currently Dr. Earleen Newport is to he is seeing. With that being said he does have x-rays that were really not conclusive  for any signs of osteomyelitis. He does have an MRI scheduled for this coming Friday which is just 2 days away. He did have ABIs which were performed on 12/29/2020 and subsequently this did reveal that  he has excellent arterial flow with his findings being normal pretty much across the board. I am very pleased in that regard. His ABI on the right was 1.22 with a TBI of 0.93 and on the left was 1.29 with a TBI of 1.08. Waveforms were triphasic throughout. The patient tells me he is currently been cleaning this with either alcohol or peroxide and then applying Silvadene cream to this and a small dab and wrapping this. 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 Otherwise he is a type II diabetic. 01/11/2021 upon evaluation today patient appears to be doing well with regard to his wound. He is showing signs of improvement which is great news and overall very pleased with where things stand today. There does not appear to be any evidence of active infection at this time which is great news as well. In general I think that the patient is on the right track. His MRI which was actually ordered by Dr. Earleen Newport was negative for any signs of osteomyelitis that is great news. Overall I think that this is going ahead on the right direction with the appropriate care. 01/18/2021 upon evaluation today patient appears to be doing well currently in regard to his toe ulcer. There is a little bit of callus around the edges of the wound but nothing nearly as significant as what we previously noted. Fortunately there does not appear to be any signs of infection either which is also good news. I think that the patient is headed in the appropriate direction. 02/01/2021 upon evaluation today patient appears to be doing actually decently well in regard to his toe ulcer. This is actually not larger than last  week although last week he had much more callus covering over the wound that we had to clear away. Once we removed all that the wound was actually a bit bigger but nonetheless look clean and I was very happy with it. T oday unfortunately he does have some callus still building up this is evidence that he still having a lot of friction and pressure happening to the toe region. I explained to the patient that this is something that we definitely need to try to manage more effectively. Again I explained that in particular I believe that a total contact cast would be the best way to try to get this healed as quickly as possible. I have seen patients actually healed quite rapidly even in just a couple of weeks with this. Again is always difficult to say exactly how quickly someone will heal but nonetheless I believe that the cast is something that we need to have on the radar potentially even for next week. Electronic Signature(s) Signed: 02/01/2021 1:28:10 PM By: Worthy Keeler PA-C Entered By: Worthy Keeler on 02/01/2021 13:28:09 -------------------------------------------------------------------------------- Physical Exam Details Patient Name: Date of Service: [name], [place] S. 02/01/2021 8:30 A M Medical Record Number: [id number] Patient Account Number: [id number] Date of Birth/Sex: Treating RN: 10-09-1955 (65 y.o. M) Primary Care Provider: [name] Other Clinician: Referring Provider: Treating Provider/Extender: [name] in Treatment: 4 Constitutional Well-nourished and well-hydrated in no acute distress. Respiratory normal breathing without difficulty. Psychiatric this patient is able to make decisions and demonstrates good insight into disease process. Alert and Oriented x 3. pleasant and cooperative. Notes Upon inspection patient's wound bed actually  showed signs of good granulation epithelization at this point. Fortunately there does not  appear to be any signs of active infection he did have some slough buildup as well some callus I did perform sharp debridement to clear this away today he tolerated that without complication postdebridement wound bed appears to be doing much better which is great news. Electronic Signature(s) Signed: 02/01/2021 1:28:25 PM By: Worthy Keeler PA-C Entered By: Worthy Keeler on 02/01/2021 13:28:25 -------------------------------------------------------------------------------- Physician Orders Details Patient Name: Date of Service: [name], [name] 02/01/2021 8:30 A M Medical Record Number: [id number] Patient Account Number: [id number] Date of Birth/Sex: Treating RN: [birth year] (65 y.o. [place]e Primary Care Provider: [name] Other Clinician: Referring Provider: Treating Provider/Extender: [name] in Treatment: 4 Verbal / Phone Orders: No Diagnosis Coding Follow-up Appointments ppointment in 1 week. - with Margarita Grizzle Return A Bathing/ Shower/ Hygiene May shower with protection but do not get wound dressing(s) wet. - on days that dressing is not changed May shower and wash wound with soap and water. Off-Loading Wound #1 Right,Plantar T Great oe Open toe surgical shoe to: - right foot Wound Treatment Wound #1 - T Great oe Wound Laterality: Plantar, Right Cleanser: Soap and Water Every Other Day/15 Days Discharge Instructions: May shower and wash wound with dial antibacterial soap and water prior to dressing change. Prim Dressing: Promogran Prisma Matrix, 4.34 (sq in) (silver collagen) Every Other Day/15 Days ary Discharge Instructions: Moisten collagen with saline or hydrogel Secondary Dressing: Woven Gauze Sponges 2x2 in (Generic) Every Other Day/15 Days Discharge Instructions: Apply over primary dressing as directed. Secondary Dressing: Optifoam Non-Adhesive Dressing, 4x4 in (Generic) Every Other Day/15 Days Discharge  Instructions: Apply over primary dressing cut to make foam donut Secured With: Conforming Stretch Gauze Bandage, Sterile 2x75 (in/in) (Generic) Every Other Day/15 Days Discharge Instructions: Secure with stretch gauze as directed. Secured With: Paper Tape, 1x10 (in/yd) (Generic) Every Other Day/15 Days Discharge Instructions: Secure dressing with tape as directed. Electronic Signature(s) Signed: 02/01/2021 4:56:53 PM By: Worthy Keeler PA-C Signed: 02/01/2021 5:20:51 PM By: Rhae Hammock RN Entered By: Rhae Hammock on 02/01/2021 09:13:24 -------------------------------------------------------------------------------- Problem List Details Patient Name: Date of Service: [name], [name] 02/01/2021 8:30 A M Medical Record Number: [id number] Patient Account Number: [id number] Date of Birth/Sex: Treating RN: [birth year] (65 y.o. M) Primary Care Provider: [name] Other Clinician: Referring Provider: Treating Provider/Extender: [name] in Treatment: 4 Active Problems ICD-10 Encounter Code Description Active Date MDM Diagnosis E11.621 Type 2 diabetes mellitus with foot ulcer 01/04/2021 No Yes L97.512 Non-pressure chronic ulcer of other part of right foot with fat layer exposed 01/04/2021 No Yes Z79.01 Long term (current) use of anticoagulants 01/04/2021 No Yes I27.82 Chronic pulmonary embolism 01/04/2021 No Yes Inactive Problems Resolved Problems Electronic Signature(s) Signed: 02/01/2021 9:03:30 AM By: Worthy Keeler PA-C Entered By: Worthy Keeler on 02/01/2021 09:03:29 -------------------------------------------------------------------------------- Progress Note Details Patient Name: Date of Service: [name], [name] 02/01/2021 8:30 A M Medical Record Number: [id number] Patient Account Number: [id number] Date of Birth/Sex: Treating RN: 1955/11/30 (65 y.o. M) Primary Care Provider: [name] Other Clinician: Referring  Provider: Treating Provider/Extender: [name] in Treatment: 4 Subjective Chief Complaint Information obtained from Patient Right great toe ulcer History of Present Illness (HPI) 01/04/2021 upon evaluation today patient presents for initial evaluation here in our clinic concerning issues  that he has been having with a plantar toe ulceration of the right foot. He tells me this has been going on since around January 2022 when he was on a trip to [place]. Subsequently following he then went to [place] where he had that received care multiple times during the time he was in [place]. He states that [place] seem to be the best as far as that was concerned. With that being said he was initially placed on clindamycin, then doxycycline during the course of this which he feels like did not do as well, and then subsequently he has been on clindamycin which she has 4 days left of at this point. No one has cultured anything up to this point. He has been seeing podiatry and currently Dr. Earleen Newport is to he is seeing. With that being said he does have x-rays that were really not conclusive for any signs of osteomyelitis. He does have an MRI scheduled for this coming Friday which is just 2 days away. He did have ABIs which were performed on 12/29/2020 and subsequently this did reveal that he has excellent arterial flow with his findings being normal pretty much across the board. I am very pleased in that regard. His ABI on the right was 1.22 with a TBI of 0.93 and on the left was 1.29 with a TBI of 1.08. Waveforms were triphasic throughout. The patient tells me he is currently been cleaning this with either alcohol or peroxide and then applying Silvadene cream to this and a small dab and wrapping this. 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 Otherwise he is a type  II diabetic. 01/11/2021 upon evaluation today patient appears to be doing well with regard to his wound. He is showing signs of improvement which is great news and overall very pleased with where things stand today. There does not appear to be any evidence of active infection at this time which is great news as well. In general I think that the patient is on the right track. His MRI which was actually ordered by Dr. Earleen Newport was negative for any signs of osteomyelitis that is great news. Overall I think that this is going ahead on the right direction with the appropriate care. 01/18/2021 upon evaluation today patient appears to be doing well currently in regard to his toe ulcer. There is a little bit of callus around the edges of the wound but nothing nearly as significant as what we previously noted. Fortunately there does not appear to be any signs of infection either which is also good news. I think that the patient is headed in the appropriate direction. 02/01/2021 upon evaluation today patient appears to be doing actually decently well in regard to his toe ulcer. This is actually not larger than last week although last week he had much more callus covering over the wound that we had to clear away. Once we removed all that the wound was actually a bit bigger but nonetheless look clean and I was very happy with it. T oday unfortunately he does have some callus still building up this is evidence that he still having a lot of friction and pressure happening to the toe region. I explained to the patient that this is something that we definitely need to try to manage more effectively. Again I explained that in particular I believe that a total contact  cast would be the best way to try to get this healed as quickly as possible. I have seen patients actually healed quite rapidly even in just a couple of weeks with this. Again is always difficult to say exactly how quickly someone will heal but nonetheless  I believe that the cast is something that we need to have on the radar potentially even for next week. Objective Constitutional Well-nourished and well-hydrated in no acute distress. Vitals Time Taken: 8:43 AM, Temperature: 98.1 F, Pulse: 69 bpm, Respiratory Rate: 18 breaths/min, Blood Pressure: 126/78 mmHg, Capillary Blood Glucose: 135 mg/dl. Respiratory normal breathing without difficulty. Psychiatric this patient is able to make decisions and demonstrates good insight into disease process. Alert and Oriented x 3. pleasant and cooperative. General Notes: Upon inspection patient's wound bed actually showed signs of good granulation epithelization at this point. Fortunately there does not appear to be any signs of active infection he did have some slough buildup as well some callus I did perform sharp debridement to clear this away today he tolerated that without complication postdebridement wound bed appears to be doing much better which is great news. Integumentary (Hair, Skin) Wound #1 status is Open. Original cause of wound was Other Lesion. The date acquired was: 06/27/2020. The wound has been in treatment 4 weeks. The wound is located on the [employer]. The wound measures 0.3cm length x 0.7cm width x 0.3cm depth; 0.165cm^2 area and 0.049cm^3 volume. There is oe Fat Layer (Subcutaneous Tissue) exposed. There is a medium amount of serosanguineous drainage noted. The wound margin is well defined and not attached to the wound base. There is large (67-100%) red granulation within the wound bed. There is no necrotic tissue within the wound bed. Assessment Active Problems ICD-10 Type 2 diabetes mellitus with foot ulcer Non-pressure chronic ulcer of other part of right foot with fat layer exposed Long term (current) use of anticoagulants Chronic pulmonary embolism Procedures Wound #1 Pre-procedure diagnosis of Wound #1 is a Diabetic Wound/Ulcer of the Lower Extremity located on  the Right,Plantar T Great .Severity of Tissue Pre oe Debridement is: Fat layer exposed. There was a Excisional Skin/Subcutaneous Tissue Debridement with a total area of 0.21 sq cm performed by Worthy Keeler, PA. With the following instrument(s): Curette to remove Viable and Non-Viable tissue/material. Material removed includes Callus, Subcutaneous Tissue, Skin: Dermis, and Skin: Epidermis after achieving pain control using Lidocaine. No specimens were taken. A time out was conducted at 09:14, prior to the start of the procedure. A Minimum amount of bleeding was controlled with Pressure. The procedure was tolerated well with a pain level of 0 throughout and a pain level of 0 following the procedure. Post Debridement Measurements: 0.3cm length x 0.7cm width x 0.3cm depth; 0.049cm^3 volume. Character of Wound/Ulcer Post Debridement is improved. Severity of Tissue Post Debridement is: Fat layer exposed. Post procedure Diagnosis Wound #1: Same as Pre-Procedure Plan Follow-up Appointments: Return Appointment in 1 week. - with Glynn Octave Shower/ Hygiene: May shower with protection but do not get wound dressing(s) wet. - on days that dressing is not changed May shower and wash wound with soap and water. Off-Loading: Wound #1 Right,Plantar T Great: oe Open toe surgical shoe to: - right foot WOUND #1: - T Great Wound Laterality: Plantar, Right oe Cleanser: Soap and Water Every Other Day/15 Days Discharge Instructions: May shower and wash wound with dial antibacterial soap and water prior to dressing change. Prim Dressing: Promogran Prisma Matrix, 4.34 (sq in) (silver collagen)  Every Other Day/15 Days ary Discharge Instructions: Moisten collagen with saline or hydrogel Secondary Dressing: Woven Gauze Sponges 2x2 in (Generic) Every Other Day/15 Days Discharge Instructions: Apply over primary dressing as directed. Secondary Dressing: Optifoam Non-Adhesive Dressing, 4x4 in (Generic) Every Other  Day/15 Days Discharge Instructions: Apply over primary dressing cut to make foam donut Secured With: Conforming Stretch Gauze Bandage, Sterile 2x75 (in/in) (Generic) Every Other Day/15 Days Discharge Instructions: Secure with stretch gauze as directed. Secured With: Paper T ape, 1x10 (in/yd) (Generic) Every Other Day/15 Days Discharge Instructions: Secure dressing with tape as directed. 1. Would recommend currently that we going continue with wound care measures as before and the patient is in agreement with plan. This includes the use of the silver collagen which I think is probably still the best option at this point. 2. I do recommend as well that the patient continue to try to offload is much as possible avoiding walking where this would aggravate the toe if at all possible. Even with the offloading shoe this is not necessarily something that is completely protected from any friction which I think is the main issue here. 3. I did have a conversation about the total contact cast I still believe this may be his best option. With that being said he would not be able to drive therefore we cannot do that today I am going to see about having him bring his wife with him next week. We will see patient back for reevaluation in 1 week here in the clinic. If anything worsens or changes patient will contact our office for additional recommendations. Electronic Signature(s) Signed: 02/01/2021 1:29:10 PM By: Worthy Keeler PA-C Entered By: Worthy Keeler on 02/01/2021 13:29:10 -------------------------------------------------------------------------------- SuperBill Details Patient Name: Date of Service: [name], [name] 02/01/2021 Medical Record Number: [id number] Patient Account Number: [id number] Date of Birth/Sex: Treating RN: 1956-01-24 (66 y.o. Burnadette Pop, [place] Primary Care Provider: [name] Other Clinician: Referring Provider: Treating Provider/Extender: [name] in Treatment: 4 Diagnosis Coding ICD-10 Codes Code Description [phone] Type 2 diabetes mellitus with foot ulcer L97.512 Non-pressure chronic ulcer of other part of right foot with fat layer exposed Z79.01 Long term (current) use of anticoagulants I27.82 Chronic pulmonary embolism Facility Procedures CPT4 Code: [id number] Description: [id number] - DEB SUBQ TISSUE 20 SQ CM/< ICD-10 Diagnosis Description L97.512 Non-pressure chronic ulcer of other part of right foot with fat layer exposed Modifier: Quantity: 1 Physician Procedures : CPT4 Code Description Modifier [id number] - WC PHYS SUBQ TISS 20 SQ CM ICD-10 Diagnosis Description L97.512 Non-pressure chronic ulcer of other part of right foot with fat layer exposed Quantity: 1 Electronic Signature(s) Signed: 02/01/2021 1:29:19 PM By: Worthy Keeler PA-C Entered By: Worthy Keeler on 02/01/2021 13:29:19

## 2021-02-02 ENCOUNTER — Ambulatory Visit (INDEPENDENT_AMBULATORY_CARE_PROVIDER_SITE_OTHER): Payer: Medicare Other | Admitting: Internal Medicine

## 2021-02-02 ENCOUNTER — Encounter: Payer: Self-pay | Admitting: Internal Medicine

## 2021-02-02 ENCOUNTER — Telehealth: Payer: Self-pay | Admitting: Internal Medicine

## 2021-02-02 VITALS — BP 126/64 | HR 74 | Temp 97.5°F | Ht 72.0 in | Wt 300.4 lb

## 2021-02-02 DIAGNOSIS — Z86711 Personal history of pulmonary embolism: Secondary | ICD-10-CM | POA: Diagnosis not present

## 2021-02-02 DIAGNOSIS — R0609 Other forms of dyspnea: Secondary | ICD-10-CM

## 2021-02-02 DIAGNOSIS — I2510 Atherosclerotic heart disease of native coronary artery without angina pectoris: Secondary | ICD-10-CM | POA: Diagnosis not present

## 2021-02-02 DIAGNOSIS — Z8249 Family history of ischemic heart disease and other diseases of the circulatory system: Secondary | ICD-10-CM | POA: Diagnosis not present

## 2021-02-02 DIAGNOSIS — Z8669 Personal history of other diseases of the nervous system and sense organs: Secondary | ICD-10-CM | POA: Diagnosis not present

## 2021-02-02 DIAGNOSIS — R0600 Dyspnea, unspecified: Secondary | ICD-10-CM

## 2021-02-02 DIAGNOSIS — Z6841 Body Mass Index (BMI) 40.0 and over, adult: Secondary | ICD-10-CM

## 2021-02-02 LAB — BASIC METABOLIC PANEL
BUN/Creatinine Ratio: 13 (ref 10–24)
BUN: 15 mg/dL (ref 8–27)
CO2: 24 mmol/L (ref 20–29)
Calcium: 9.2 mg/dL (ref 8.6–10.2)
Chloride: 95 mmol/L — ABNORMAL LOW (ref 96–106)
Creatinine, Ser: 1.19 mg/dL (ref 0.76–1.27)
Glucose: 211 mg/dL — ABNORMAL HIGH (ref 65–99)
Potassium: 4 mmol/L (ref 3.5–5.2)
Sodium: 136 mmol/L (ref 134–144)
eGFR: 68 mL/min/{1.73_m2} (ref >59)

## 2021-02-02 LAB — MAGNESIUM: Magnesium: 2 mg/dL (ref 1.6–2.3)

## 2021-02-02 LAB — D-DIMER, QUANTITATIVE: D-Dimer, Quant: 0.26 mcg/mL FEU (ref <0.50)

## 2021-02-02 NOTE — Progress Notes (Signed)
Subjective:    Patient ID: [name], male    DOB: July 04, 1955, 65 y.o.   MRN: [id number]  PCP Leandrew Koyanagi, MD > Dr [name]  HPI   IOV 01/20/2016  Chief Complaint  Patient presents with   Pulmonary Consult    Pt referred by Dr. [name] for pleuritic chest pain - left side. Pt states the pain is better when sleeping on right side. Pt denies significant cough. Pt c/o intermittent DOE.    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 He is accompanied by his wife. He has obesity and metabolic syndrome. He says that starting early June 2017 started noticing a sensation in his left lower chest left upper quadrant anterior axillary area. The pain was constant but occasionally would have spontaneous increase in intensity. The best describes this as a pain but although he says it might not be a pain but moreover sensation. It is definitely relieved when he lies down on his right side and occasionally made worse if he were to take multiple deep breaths but no other clear cut aggravating or relieving factors. Then in mid July 2017 he saw primary care physician in 01/03/2016 had Rib x-ray that was normal. Does report of atelectasis left base and therefore referred. While waiting for this appointment on 01/16/2016 he said he had several more wine drinks than usual and passed out and probably bump the left lower quadrant area of the chest against the desk but he is not sure and then this afternoon and new bruising appeared in the same area as the sensation/pain. Is no change in dyspnea or orthopnea. He does not have. His chronic sleep apnea which uses CPAP at managed by primary care physician. He is worried about pulmonary embolism because his mom died of DVT and pulmonary embolism. There is no hemoptysis  Review of chest x-ray from 2015 and prior chest films Personally visualized shows chronic elevation of the left diaphragm but he is not aware of this  problem    ICD-9-CM ICD-10-CM   1. Chest pain, pleuritic 786.52 R07.81   2. Superficial bruising of chest wall, left, initial encounter 922.1 S20.212A   3. 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 Range 01/20/2016 17:31  D-Dimer, Quant Latest Ref Range: <0.50 mcg/mL FEU 0.42     CT chest with or without contrast depending on d-dimer blood test Do sniff test next week  Followup  - depending on test results  SNIFF TEST    IMPRESSION: No paralysis of the left hemidiaphragm. Gaseous distention of the stomach which appears to be new since lap band placement.     Electronically Signed   By: Lorriane Shire M.D.   On: 01/24/2016 11:40   CT chest aug 2017  IMPRESSION: 1. No fracture is evident. There are degenerative changes throughout the thoracic spine. 2. Calcification of the coronary arteries diffusely. 3. No lung infiltrate or pleural effusion.     Electronically Signed   By: Ivar Drape M.D.   On: 01/26/2016 08:17  Nuclear medicine cardiac stress study May 2017 Study Highlights  Nuclear stress EF: 47%. The left ventricular ejection fraction is mildly decreased (45-54%). There was no ST segment deviation noted during stress. This is a low risk study. There is no evidence of ischemia or previous infarction   OV 02/02/2021  Subjective:  Patient ID: [name], male , DOB: November 28, 1955 , age 65 y.o. , MRN: [id number] , ADDRESS: [place] [id number] PCP [name], MD Patient Care Team: [name], MD as PCP - General (Family Medicine) Werner Lean, MD as PCP - Cardiology (Cardiology) Belva Crome, MD as Consulting Physician (Cardiology) Milus Banister, MD as Attending Physician (Gastroenterology) Brand Males, MD as Consulting Physician (Pulmonary Disease) Druscilla Brownie, MD as Consulting Physician  (Dermatology) Mosetta Anis, MD as Referring Physician (Allergy) Rexene Agent, MD as Attending Physician (Nephrology) Thornell Sartorius, MD as Consulting Physician (Otolaryngology) Christain Sacramento, [place] as Referring Physician (Optometry) Ulice Brilliant, MD as Referring Physician (Internal Medicine)  This Provider for this visit: Treatment Team:  Attending Provider: Brand Males, MD    02/02/2021 -   Chief Complaint  Patient presents with   Consult    Former pt last seen 01/20/16.  Pt states that he has had problems with SOB x6 months that happens with exertion.     HPI [name] 65 y.o. -personally not seen in 5 years.  Therefore there is a new consult.  He is here with his wife.  Since I last saw him he is now retired.  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  He tells me for the last 6 months or so he has had insidious onset of shortness of breath present on exertion such as walking from the car to the front desk of this office.  Relieved by rest.  Since its onset it is stable.  No associated chest pain.  There is no associated cough or wheezing.  No paroxysmal nocturnal dyspnea.  No orthopnea no weight gain.  Cardiologist has increased Lasix for pedal edema.    He had a stress test several years ago that was normal but he had low ejection fraction.  Most recently seen Dr. Ezra Sites at [facility] cardiologist who is getting an echo on him.  He also has a longstanding history of sleep apnea.  He no longer has a sleep doctor but uses auto CPAP which he thinks is mostly set at 12.  There is no oxygen use at night.  He also has a wound on the plantar aspect of his right hallux for which he sees podiatry.  He has knee issues.  He has obesity, AAA, OSA on CPAP, diabetes, hypertension.  In the midst of all this he had a routine CT scan of the chest with [facility] on December 16, 2020 and this showed a small pulmonary embolism on the right side without any RV strain.  He was  started on outpatient Xarelto by his primary care physician.  He was never hospitalized for this never on oxygen for this.  He wants to discuss this as well.  He and his wife have questions on severity of pulmonary embolism.  He wanted to know the size of it on the CT scan.  The CT scan was sent for upload to PACS system but I do not visualize the image.  He is currently on full dose Xarelto.  The only scan that I can visualize is the he 2017 CT scan that looks clear upon my personal visualization.   Simple office walk 185 feet x  3 laps goal with forehead probe 02/02/2021   O2 used ra  Number laps completed 3  Comments about pace avg  Resting Pulse Ox/HR 99% and 74/min  Final Pulse  Ox/HR 97% and 94/min  Desaturated </= 88% no  Desaturated <= 3% points no  Got Tachycardic >/= 90/min yes  Symptoms at end of test Mild dyspnea  Miscellaneous comments x     Results for [name], [name] (MRN [id number]) as of 02/02/2021 14:37  Ref. Range 02/01/2021 15:02  Creatinine Latest Ref Range: 0.76 - 1.27 mg/dL 1.19  Results for [name], [name] (MRN [id number]) as of 02/02/2021 14:37  Ref. Range 08/15/2018 10:01  Hemoglobin Latest Ref Range: 13.0 - 17.7 g/dL 16.5   CTA 12/16/20  Griffith Citron, MD - 12/16/2020  Formatting of this note might be different from the original.  INDICATION: Interstitial pulmonary disease, unspecified (#).   COMPARISON:  None.   TECHNIQUE:  Routine CT thoracic angiogram with contrast was performed using Contrast:  100 mL mL of Isovue 370.  Multiplanar 2D and angiographic 3D MIP images were constructed and reviewed. Radiation dose reduction was utilized (automated exposure control, mA or kV adjustment based on patient size, or iterative image reconstruction).   Contrast bolus quality:  satisfactory   FINDINGS:   The ascending thoracic aorta measures 3.6 x 3.5 cm in transverse by AP dimension. The descending thoracic aorta measures 2.6 x 2.6 cm in transverse by AP  dimension. There is atherosclerotic disease of the thoracic aorta and coronary arteries without evidence of focal intimal irregularity.   There is a central pulmonary arterial filling defect in the right lower lobe segmental pulmonary artery (series 4, image 43). No CT evidence of right heart strain. No pericardial effusion or bulky mediastinal adenopathy.   No focal consolidation. No pleural effusion or pneumothorax.   Gastric band is present. There is a cyst dorsally seen in the upper pole the right kidney. The visualized portions of the upper abdomen are otherwise unremarkable.    IMPRESSION:  1.  Segmental pulmonary thromboembolism in the right lower lobe. No evidence of right heart strain.       has a past medical history of Abdominal aortic aneurysm (AAA), 30-34 mm diameter ([place]) (06/01/2017), Arthritis, Cervical disc disease (12/26/2011), Coronary artery disease involving native coronary artery of native heart without angina pectoris (12/01/2016), Diabetes mellitus, Difficult intubation (04/05/2014), Dyslipidemia, Family history of anesthesia complication, Fatty liver (06/01/2017), GERD (gastroesophageal reflux disease), H/O hiatal hernia, Hearing loss-aides (03/26/2012), Hyperlipidemia, Hypertension, Morbid obesity ([place]), OSA (obstructive sleep apnea), Rotator cuff tear, Seasonal allergies, Shortness of breath, and Sleep apnea.   reports that he quit smoking about 29 years ago. His smoking use included cigarettes. He started smoking about 47 years ago. He has a 80.00 pack-year smoking history. He has never used smokeless tobacco.  Past Surgical History:  Procedure Laterality Date   CARDIAC CATHETERIZATION     approx 3 years ago    COLONOSCOPY WITH PROPOFOL N/A 03/10/2015   Procedure: COLONOSCOPY WITH PROPOFOL;  Surgeon: Milus Banister, MD;  Location: [facility];  Service: Endoscopy;  Laterality: N/A;   colonscopy      2012   ESOPHAGOGASTRODUODENOSCOPY (EGD) WITH PROPOFOL N/A  03/10/2015   Procedure: ESOPHAGOGASTRODUODENOSCOPY (EGD) WITH PROPOFOL;  Surgeon: Milus Banister, MD;  Location: [facility];  Service: Endoscopy;  Laterality: N/A;   HERNIA REPAIR     with gastric banding   LAPAROSCOPIC GASTRIC BANDING N/A 04/05/2014   Procedure: LAPAROSCOPIC GASTRIC BANDING;  Surgeon: Pedro Earls, MD;  Location: [facility];  Service: General;  Laterality: N/A;   MENISCUS REPAIR Left    left knee torn meniscus   VASECTOMY  Allergies  Allergen Reactions   Erythromycin Nausea Only   Penicillins Nausea Only    Upset stomach   Quercus Robur Itching    Immunization History  Administered Date(s) Administered   Influenza, High Dose Seasonal PF 04/30/2018   Influenza,inj,Quad PF,6+ Mos 06/30/2016, 03/14/2017   Moderna Sars-Covid-2 Vaccination 10/14/2020   PFIZER(Purple Top)SARS-COV-2 Vaccination 09/10/2019, 10/05/2019   Pneumococcal Polysaccharide-23 10/09/2012   Tdap 02/09/2015    Family History  Problem Relation Age of Onset   Clotting disorder Mother        lung clot   Mesothelioma Father    Hypertension Brother    Skin cancer Brother    Colon cancer Neg Hx      Current Outpatient Medications:    diltiazem (TIAZAC) 300 MG 24 hr capsule, Take 300 mg by mouth daily., Disp: , Rfl:    fluticasone (FLONASE) 50 MCG/ACT nasal spray, Place 2 sprays into both nostrils daily., Disp: 16 g, Rfl: 2   furosemide (LASIX) 40 MG tablet, Take 1 tablet (40 mg total) by mouth 2 (two) times daily. (Patient taking differently: Take 80 mg by mouth daily.), Disp: 180 tablet, Rfl: 3   glucose blood (TRUETEST TEST) test strip, Use as instructed, Disp: 100 each, Rfl: 2   insulin degludec (TRESIBA FLEXTOUCH) 200 UNIT/ML FlexTouch Pen, Inject 104 Units into the skin daily., Disp: , Rfl:    Insulin Pen Needle (B-D UF III MINI PEN NEEDLES) 31G X 5 MM MISC, USE AS DIRECTED ONCE A DAY, Disp: 100 each, Rfl: 11   lisinopril (ZESTRIL) 40 MG tablet, daily., Disp: , Rfl:    Multiple  Vitamin (MULTIVITAMIN) tablet, Take 1 tablet by mouth every morning. , Disp: , Rfl:    NEOMYCIN-POLYMYXIN-HYDROCORTISONE (CORTISPORIN) 1 % SOLN OTIC solution, as needed., Disp: , Rfl:    Potassium Chloride ER 20 MEQ TBCR, Take 40 mEq by mouth daily., Disp: , Rfl:    rosuvastatin (CRESTOR) 20 MG tablet, Take 1 tablet (20 mg total) by mouth daily., Disp: 90 tablet, Rfl: 1   tamsulosin (FLOMAX) 0.4 MG CAPS capsule, Take 1 capsule (0.4 mg total) by mouth daily after supper., Disp: 90 capsule, Rfl: 0   XARELTO 20 MG TABS tablet, Take 20 mg by mouth daily., Disp: , Rfl:    ketoconazole (NIZORAL) 2 % cream, Apply 1 application topically 2 (two) times daily. (Patient not taking: Reported on 02/02/2021), Disp: 60 g, Rfl: 2      Objective:   Vitals:   02/02/21 1411  BP: 126/64  Pulse: 74  Temp: (!) 97.5 F (36.4 C)  TempSrc: Oral  SpO2: 96%  Weight: (!) 300 lb 6.4 oz (136.3 kg)  Height: 6' (1.829 m)    Estimated body mass index is 40.74 kg/m as calculated from the following:   Height as of this encounter: 6' (1.829 m).   Weight as of this encounter: 300 lb 6.4 oz (136.3 kg).  '@WEIGHTCHANGE'$ @  [employer]   02/02/21 1411  Weight: (!) 300 lb 6.4 oz (136.3 kg)     Physical Exam  General Appearance:    Alert, cooperative, no distress, appears stated age - yes , Deconditioned looking - no , OBESE  - yes, Sitting on Wheelchair -  no  Head:    Normocephalic, without obvious abnormality, atraumatic  Eyes:    PERRL, conjunctiva/corneas clear,  Ears:    Normal TM's and external ear canals, both ears  Nose:   Nares normal, septum midline, mucosa normal, no drainage    or  sinus tenderness. OXYGEN ON  - no . Patient is @ rra   Throat:   Lips, mucosa, and tongue normal; teeth and gums normal. Cyanosis on lips - no  Neck:   Supple, symmetrical, trachea midline, no adenopathy;    thyroid:  no enlargement/tenderness/nodules; no carotid   bruit or JVD  Back:     Symmetric, no curvature, ROM  normal, no CVA tenderness  Lungs:     Distress - no , Wheeze no, Barrell Chest - no, Purse lip breathing - no, Crackles - no   Chest Wall:    No tenderness or deformity.    Heart:    Regular rate and rhythm, S1 and S2 normal, no rub   or gallop, Murmur - no  Breast Exam:    NOT DONE  Abdomen:     Soft, non-tender, bowel sounds active all four quadrants,    no masses, no organomegaly. Visceral obesity - yes  Genitalia:   NOT DONE  Rectal:   NOT DONE  Extremities:   Extremities - normal, Has Cane - no, Clubbing - no, Edema - no  Pulses:   2+ and symmetric all extremities  Skin:   Stigmata of Connective Tissue Disease - no  Lymph nodes:   Cervical, supraclavicular, and axillary nodes normal  Psychiatric:  Neurologic:   Pleasant - yes, Anxious - no, Flat affect - no  CAm-ICU - neg, Alert and Oriented x 3 - yes, Moves all 4s - yes, Speech - normal, Cognition - intact           Assessment:       ICD-10-CM   1. DOE (dyspnea on exertion)  R06.00 Pulse oximetry, overnight    D-dimer, quantitative    Ambulatory referral to Hematology / Oncology    Pulmonary function test    CT Chest High Resolution    2. History of pulmonary embolus (PE)  Z86.711     3. Family history of pulmonary embolism  Z82.49     4. History of obstructive sleep apnea  Z86.69     5. BMI 40.0-44.9, adult [facility])  Z68.41          Plan:     Patient Instructions     ICD-10-CM   1. DOE (dyspnea on exertion)  R06.00     2. History of pulmonary embolus (PE)  Z86.711     3. Family history of pulmonary embolism  Z82.49     4. History of obstructive sleep apnea  Z86.69     5. BMI 40.0-44.9, adult [facility])  Z68.41      Current shortness of breath is likely multifactorial Most recent pulmonary embolism in July 2022 was low severity But he seemed to have significant risk factors for pulm embolism including her weight, and strong family history It appears despite treatment with Xarelto of the pulmonary embolism  your shortness of breath remains unchanged Oxygen levels held good for simple walk test  Plan - Do overnight pulse oximetry study while using his CPAP on room air -Check blood D-dimer -Refer to hematology Dr Irene Limbo or  Dr Alvy Bimler  - dicuss cause and also length of antiocoagulation  - await echo results  - do full PFT  - do HRCT supine and prone   [place] or APP next few weeks to discuss results    SIGNATURE    Dr. Brand Males, M.D., F.C.C.P,  Pulmonary and Critical Care Medicine Staff Physician, [facility] Director - Interstitial Lung  Disease  Program  Pulmonary [place] [facility] [place], [place], [address]  Pager: [phone], If no answer or between  15:00h - 7:00h: call [phone] Telephone: [phone]  3:21 PM 02/02/2021

## 2021-02-02 NOTE — Telephone Encounter (Signed)
Order was placed at pt's OV for pt to have ONO performed on CPAP.   Pt's CPAP is managed by his PCP.  Attempted to call pt to see what DME he is with for his cpap so that way we can send ONO order to that DME but unable to reach pt. Left pt a detailed message for him to call back with this info.  Will await a return call from pt.

## 2021-02-02 NOTE — Patient Instructions (Addendum)
ICD-10-CM   1. DOE (dyspnea on exertion)  R06.00     2. History of pulmonary embolus (PE)  Z86.711     3. Family history of pulmonary embolism  Z82.49     4. History of obstructive sleep apnea  Z86.69     5. BMI 40.0-44.9, adult [facility])  Z68.41      Current shortness of breath is likely multifactorial Most recent pulmonary embolism in July 2022 was low severity But he seemed to have significant risk factors for pulm embolism including her weight, and strong family history It appears despite treatment with Xarelto of the pulmonary embolism your shortness of breath remains unchanged Oxygen levels held good for simple walk test  Plan - Do overnight pulse oximetry study while using his CPAP on room air -Check blood D-dimer -Refer to hematology Dr Irene Limbo or  Dr Alvy Bimler  - dicuss cause and also length of antiocoagulation  - await echo results  - do full PFT  - do HRCT supine and prone   [place] or APP next few weeks to discuss results

## 2021-02-03 ENCOUNTER — Other Ambulatory Visit: Payer: Self-pay

## 2021-02-03 ENCOUNTER — Other Ambulatory Visit: Payer: Medicare Other

## 2021-02-03 ENCOUNTER — Ambulatory Visit (HOSPITAL_COMMUNITY): Payer: Medicare Other | Attending: Internal Medicine

## 2021-02-03 DIAGNOSIS — I509 Heart failure, unspecified: Secondary | ICD-10-CM | POA: Diagnosis not present

## 2021-02-03 LAB — ECHOCARDIOGRAM COMPLETE
Area-P 1/2: 2.9 cm2
S' Lateral: 3.5 cm

## 2021-02-03 NOTE — Addendum Note (Signed)
Addended by: Vanessa Barbara on: 02/03/2021 12:00 PM   Modules accepted: Orders

## 2021-02-03 NOTE — Telephone Encounter (Signed)
Called and spoke with patient, he verified that his DME company is [employer] and he uses a resmed CPAP airsense 10 machine.  Advised that the DME company would contact him to complete the ONO on the CPAP and once we have the results we will call him with the results/recommendations.

## 2021-02-06 ENCOUNTER — Encounter: Payer: Self-pay | Admitting: *Deleted

## 2021-02-06 DIAGNOSIS — I509 Heart failure, unspecified: Secondary | ICD-10-CM

## 2021-02-06 DIAGNOSIS — I10 Essential (primary) hypertension: Secondary | ICD-10-CM

## 2021-02-06 MED ORDER — DAPAGLIFLOZIN PROPANEDIOL 10 MG PO TABS: 10.0000 mg | ORAL_TABLET | Freq: Every day | ORAL | 3 refills | Status: DC

## 2021-02-06 NOTE — Telephone Encounter (Signed)
Called pt reviewed MD recommendations.  He reports that he has Medicare Part D-[employer].  Pt is agreeable to plan of care.  Order placed.  Pt informed to call the office if copay is too much for Farxiga 10 mg PO QD.   He verbalizes understanding.

## 2021-02-06 NOTE — Progress Notes (Signed)
[name], [name] ([id number]) Visit Report for 02/01/2021 Arrival Information Details Patient Name: Date of Service: [place], [name] 02/01/2021 8:30 A M Medical Record Number: [id number] Patient Account Number: [id number] Date of Birth/Sex: Treating RN: August 23, 1955 (65 y.o. [name] Primary Care Meyah Corle: [name] Other Clinician: Referring Sharrieff Spratlin: Treating Alyss Granato/Extender: [name] in Treatment: 4 Visit Information History Since Last Visit Added or deleted any medications: No Patient Arrived: Ambulatory Any new allergies or adverse reactions: No Arrival Time: 08:41 Had a fall or experienced change in No Accompanied By: self activities of daily living that may affect Transfer Assistance: None risk of falls: Patient Identification Verified: Yes Signs or symptoms of abuse/neglect since last visito No Secondary Verification Process Completed: Yes Hospitalized since last visit: No Patient Requires Transmission-Based Precautions: No Implantable device outside of the clinic excluding No Patient Has Alerts: Yes cellular tissue based products placed in the center Patient Alerts: Patient on Blood Thinner since last visit: ABI's R=1.22 L=1.29 Has Dressing in Place as Prescribed: Yes Pain Present Now: Yes Electronic Signature(s) Signed: 02/01/2021 2:50:45 PM By: Sandre Kitty Entered By: Sandre Kitty on 02/01/2021 08:43:16 -------------------------------------------------------------------------------- Encounter Discharge Information Details Patient Name: Date of Service: [name] Lefevre, [name] [name] 02/01/2021 8:30 A M Medical Record Number: [id number] Patient Account Number: [id number] Date of Birth/Sex: Treating RN: 09-20-1955 (64 y.o. [name], [name] Primary Care Keshav Winegar: [name] Other Clinician: Referring Lyrik Dockstader: Treating Dimarco Minkin/Extender: [name] in Treatment: 4 Encounter  Discharge Information Items Post Procedure Vitals Discharge Condition: Stable Temperature (F): 97.4 Ambulatory Status: Ambulatory Pulse (bpm): 74 Discharge Destination: Home Respiratory Rate (breaths/min): 17 Transportation: Private Auto Blood Pressure (mmHg): 147/74 Accompanied By: self Schedule Follow-up Appointment: Yes Clinical Summary of Care: Patient Declined Electronic Signature(s) Signed: 02/01/2021 5:20:51 PM By: Rhae Hammock RN Entered By: Rhae Hammock on 02/01/2021 09:54:19 -------------------------------------------------------------------------------- Lower Extremity Assessment Details Patient Name: Date of Service: [name] Lefevre, [name] [name] 02/01/2021 8:30 A M Medical Record Number: [id number] Patient Account Number: [id number] Date of Birth/Sex: Treating RN: December 28, 1955 (64 y.o. [name], [name] Primary Care Aleisha Paone: [name] Other Clinician: Referring Primus Gritton: Treating Trumaine Wimer/Extender: [name] in Treatment: 4 Edema Assessment Assessed: Shirlyn Goltz: No] Patrice Paradise: No] Edema: [Left: Ye] [Right: s] Calf Left: Right: Point of Measurement: From Medial Instep 45 cm Ankle Left: Right: Point of Measurement: From Medial Instep 29 cm Vascular Assessment Pulses: Dorsalis Pedis Palpable: [Right:Yes] Posterior Tibial Palpable: [Right:Yes] Electronic Signature(s) Signed: 02/01/2021 5:20:51 PM By: Rhae Hammock RN Entered By: Rhae Hammock on 02/01/2021 08:53:55 -------------------------------------------------------------------------------- Multi-Disciplinary Care Plan Details Patient Name: Date of Service: [name] Lefevre, [name] [name] 02/01/2021 8:30 A M Medical Record Number: [id number] Patient Account Number: [id number] Date of Birth/Sex: Treating RN: 12/24/1955 (65 y.o. [name], [name] Primary Care Yahya Boldman: [name] Other Clinician: Referring Thressa Shiffer: Treating Vern Guerette/Extender: [name] in Treatment: 4 Multidisciplinary Care Plan reviewed with physician Active Inactive Nutrition Nursing Diagnoses: Impaired glucose control: actual or potential Potential for alteratiion in Nutrition/Potential for imbalanced nutrition Goals: Patient/caregiver will maintain therapeutic glucose control Date Initiated: 01/04/2021 Target Resolution Date: 02/01/2021 Goal Status: Active Interventions: Assess HgA1c results as ordered upon admission and as needed Assess patient nutrition upon admission and as needed per policy Provide education on elevated blood sugars and impact on wound healing Treatment Activities: Patient referred to Primary Care Physician for further nutritional evaluation :  01/04/2021 Notes: Wound/Skin Impairment Nursing Diagnoses: Impaired tissue integrity Knowledge deficit related to ulceration/compromised skin integrity Goals: Patient/caregiver will verbalize understanding of skin care regimen Date Initiated: 01/04/2021 Target Resolution Date: 02/01/2021 Goal Status: Active Ulcer/skin breakdown will have a volume reduction of 30% by week 4 Date Initiated: 01/04/2021 Target Resolution Date: 02/01/2021 Goal Status: Active Interventions: Assess patient/caregiver ability to obtain necessary supplies Assess patient/caregiver ability to perform ulcer/skin care regimen upon admission and as needed Assess ulceration(s) every visit Provide education on ulcer and skin care Treatment Activities: Skin care regimen initiated : 01/04/2021 Topical wound management initiated : 01/04/2021 Notes: Electronic Signature(s) Signed: 02/01/2021 5:20:51 PM By: Rhae Hammock RN Entered By: Rhae Hammock on 02/01/2021 09:03:22 -------------------------------------------------------------------------------- Pain Assessment Details Patient Name: Date of Service: [name] Lefevre, [name] [name] 02/01/2021 8:30 A M Medical Record Number: [id number] Patient Account  Number: [id number] Date of Birth/Sex: Treating RN: 09-13-1955 (65 y.o. [name] Primary Care Ryleah Miramontes: [name] Other Clinician: Referring Cheney Ewart: Treating Marcellis Frampton/Extender: [name] in Treatment: 4 Active Problems Location of Pain Severity and Description of Pain Patient Has Paino Yes Site Locations Rate the pain. Rate the pain. Current Pain Level: 2 Pain Management and Medication Current Pain Management: Electronic Signature(s) Signed: 02/01/2021 2:50:45 PM By: Sandre Kitty Signed: 02/06/2021 5:14:01 PM By: Baruch Gouty RN, BSN Entered By: Sandre Kitty on 02/01/2021 08:44:19 -------------------------------------------------------------------------------- Patient/Caregiver Education Details Patient Name: Date of Service: [name] Lefevre, [name] RSHA Natalia Leatherwood 8/17/2022andnbsp8:30 A M Medical Record Number: [id number] Patient Account Number: [id number] Date of Birth/Gender: Treating RN: [birth year] (65 y.o. [name] Primary Care Physician: [name] Other Clinician: Referring Physician: Treating Physician/Extender: [name] in Treatment: 4 Education Assessment Education Provided To: Patient Education Topics Provided Wound/Skin Impairment: Methods: Explain/Verbal Responses: State content correctly Electronic Signature(s) Signed: 02/01/2021 5:20:51 PM By: Rhae Hammock RN Entered By: Rhae Hammock on 02/01/2021 09:03:33 -------------------------------------------------------------------------------- Wound Assessment Details Patient Name: Date of Service: [name] Lefevre, [name] [name] 02/01/2021 8:30 A M Medical Record Number: [id number] Patient Account Number: [id number] Date of Birth/Sex: Treating RN: Oct 01, 1955 (65 y.o. [name], [name] Primary Care Erica Richwine: [name] Other Clinician: Referring Mone Commisso: Treating Euphemia Lingerfelt/Extender: [name] in Treatment: 4 Wound Status Wound Number: 1 Primary Diabetic Wound/Ulcer of the Lower Extremity Etiology: Wound Location: Right, Plantar T Great oe Wound Open Wounding Event: Other Lesion Status: Date Acquired: 06/27/2020 Comorbid Sleep Apnea, Coronary Artery Disease, Hypertension, Type II Weeks Of Treatment: 4 History: Diabetes, Osteoarthritis, Neuropathy Clustered Wound: No Photos Wound Measurements Length: (cm) 0.3 Width: (cm) 0.7 Depth: (cm) 0.3 Area: (cm) 0.165 Volume: (cm) 0.049 % Reduction in Area: -5.1% % Reduction in Volume: 38% Epithelialization: Medium (34-66%) Wound Description Classification: Grade 1 Wound Margin: Well defined, not attached Exudate Amount: Medium Exudate Type: Serosanguineous Exudate Color: red, brown Foul Odor After Cleansing: No Slough/Fibrino No Wound Bed Granulation Amount: Large (67-100%) Exposed Structure Granulation Quality: Red Fascia Exposed: No Necrotic Amount: None Present (0%) Fat Layer (Subcutaneous Tissue) Exposed: Yes Tendon Exposed: No Muscle Exposed: No Joint Exposed: No Bone Exposed: No Treatment Notes Wound #1 (Toe Great) Wound Laterality: Plantar, Right Cleanser Soap and Water Discharge Instruction: May shower and wash wound with dial antibacterial soap and water prior to dressing change. Peri-Wound Care Topical Primary Dressing Promogran Prisma Matrix, 4.34 (sq in) (silver collagen) Discharge Instruction: Moisten collagen with saline or hydrogel Secondary Dressing Woven Gauze Sponges 2x2 in Discharge  Instruction: Apply over primary dressing as directed. Optifoam Non-Adhesive Dressing, 4x4 in Discharge Instruction: Apply over primary dressing cut to make foam donut Secured With Conforming Stretch Gauze Bandage, Sterile 2x75 (in/in) Discharge Instruction: Secure with stretch gauze as directed. Paper Tape, 1x10 (in/yd) Discharge Instruction: Secure dressing with tape as directed. Compression  Wrap Compression Stockings Add-Ons Electronic Signature(s) Signed: 02/01/2021 5:25:21 PM By: Deon Pilling Entered By: Deon Pilling on 02/01/2021 08:49:26 -------------------------------------------------------------------------------- Vitals Details Patient Name: Date of Service: [name] Lefevre, [name] [name] 02/01/2021 8:30 A M Medical Record Number: [id number] Patient Account Number: [id number] Date of Birth/Sex: Treating RN: [birth year] (65 y.o. [name] Primary Care Denajah Farias: [name] Other Clinician: Referring Alethia Melendrez: Treating Briyonna Omara/Extender: [name] in Treatment: 4 Vital Signs Time Taken: 08:43 Temperature (F): 98.1 Pulse (bpm): 69 Respiratory Rate (breaths/min): 18 Blood Pressure (mmHg): 126/78 Capillary Blood Glucose (mg/dl): 135 Reference Range: 80 - 120 mg / dl Electronic Signature(s) Signed: 02/01/2021 2:50:45 PM By: Sandre Kitty Entered By: Sandre Kitty on 02/01/2021 08:44:09

## 2021-02-06 NOTE — Progress Notes (Signed)
I received a message from new patient coordinator of [name] referral on 8/19.  The information given to me is that patient has PE and would like to discuss with hem/onc.  I updated her that he can be seen by any hemo/onc for discussion and for her to call and schedule.

## 2021-02-06 NOTE — Telephone Encounter (Signed)
-----   Message from Werner Lean, MD sent at 02/05/2021  4:25 PM EDT ----- Results: Consistent with clinical phenotype of HFpEF Plan: Would ask for pharmacy assistance for most cost effective SGLT2i  Werner Lean, MD

## 2021-02-07 ENCOUNTER — Other Ambulatory Visit: Payer: Self-pay

## 2021-02-07 ENCOUNTER — Ambulatory Visit
Admission: RE | Admit: 2021-02-07 | Discharge: 2021-02-07 | Disposition: A | Discharge status: Home / Self Care | Payer: Medicare Other | Source: Ambulatory Visit | Attending: Internal Medicine | Admitting: Internal Medicine

## 2021-02-07 DIAGNOSIS — R0609 Other forms of dyspnea: Secondary | ICD-10-CM

## 2021-02-07 DIAGNOSIS — R0600 Dyspnea, unspecified: Secondary | ICD-10-CM

## 2021-02-08 ENCOUNTER — Telehealth: Payer: Self-pay | Admitting: Hematology and Oncology

## 2021-02-08 ENCOUNTER — Encounter (HOSPITAL_BASED_OUTPATIENT_CLINIC_OR_DEPARTMENT_OTHER): Payer: Medicare Other | Admitting: Physician Assistant

## 2021-02-08 DIAGNOSIS — E11621 Type 2 diabetes mellitus with foot ulcer: Secondary | ICD-10-CM | POA: Diagnosis not present

## 2021-02-08 NOTE — Progress Notes (Addendum)
[name], [name] ([id number]) Visit Report for 02/08/2021 Chief Complaint Document Details Patient Name: Date of Service: [name], [name] 02/08/2021 9:45 A M Medical Record Number: [id number] Patient Account Number: [id number] Date of Birth/Sex: Treating RN: 1956/02/10 (65 y.o. [name] Primary Care Provider: [name] Other Clinician: Referring Provider: Treating Provider/Extender: [name] in Treatment: 5 Information Obtained from: Patient Chief Complaint Right great toe ulcer Electronic Signature(s) Signed: 02/08/2021 10:12:32 AM By: Worthy Keeler PA-C Entered By: Worthy Keeler on 02/08/2021 10:12:32 -------------------------------------------------------------------------------- Debridement Details Patient Name: Date of Service: [name], [place] S. 02/08/2021 9:45 A M Medical Record Number: [id number] Patient Account Number: [id number] Date of Birth/Sex: Treating RN: January 27, 1956 (65 y.o. [name] Primary Care Provider: [name] Other Clinician: Referring Provider: Treating Provider/Extender: [name] in Treatment: 5 Debridement Performed for Assessment: Wound #1 Right,Plantar T Great oe Performed By: Physician Worthy Keeler, PA Debridement Type: Debridement Severity of Tissue Pre Debridement: Fat layer exposed Level of Consciousness (Pre-procedure): Awake and Alert Pre-procedure Verification/Time Out Yes - 10:35 Taken: Start Time: 10:39 Pain Control: Lidocaine 4% T opical Solution T Area Debrided (L x W): otal 0.9 (cm) x 0.8 (cm) = 0.72 (cm) Tissue and other material debrided: Viable, Non-Viable, Callus, Skin: Epidermis Level: Skin/Epidermis Debridement Description: Selective/Open Wound Instrument: Curette Bleeding: Minimum Hemostasis Achieved: Pressure End Time: 10:43 Procedural Pain: 0 Post Procedural Pain: 0 Response to Treatment: Procedure was tolerated  well Level of Consciousness (Post- Awake and Alert procedure): Post Debridement Measurements of Total Wound Length: (cm) 0.4 Width: (cm) 0.3 Depth: (cm) 0.4 Volume: (cm) 0.038 Character of Wound/Ulcer Post Debridement: Improved Severity of Tissue Post Debridement: Fat layer exposed Post Procedure Diagnosis Same as Pre-procedure Electronic Signature(s) Signed: 02/08/2021 5:35:19 PM By: Baruch Gouty RN, BSN Signed: 02/08/2021 6:37:42 PM By: Worthy Keeler PA-C Entered By: Baruch Gouty on 02/08/2021 10:43:11 -------------------------------------------------------------------------------- HPI Details Patient Name: Date of Service: [name] Lefevre, [name] 02/08/2021 9:45 A M Medical Record Number: [id number] Patient Account Number: [id number] Date of Birth/Sex: Treating RN: 03/17/1956 (65 y.o. [name] Primary Care Provider: [name] Other Clinician: Referring Provider: Treating Provider/Extender: [name] in Treatment: 5 History of Present Illness HPI Description: 01/04/2021 upon evaluation today patient presents for initial evaluation here in our clinic concerning issues that he has been having with a plantar toe ulceration of the right foot. He tells me this has been going on since around January 2022 when he was on a trip to [place]. Subsequently following he then went to [place] where he had that received care multiple times during the time he was in [place]. He states that [place] seem to be the best as far as that was concerned. With that being said he was initially placed on clindamycin, then doxycycline during the course of this which he feels like did not do as well, and then subsequently he has been on clindamycin which she has 4 days left of at this point. No one has cultured anything up to this point. He has been seeing podiatry and currently Dr. Earleen Newport is to he is seeing. With that being said he does have x-rays that were really  not conclusive for any signs of osteomyelitis. He does have an MRI scheduled for this coming Friday which is just 2 days away. He did have ABIs which were performed on 12/29/2020 and  subsequently this did reveal that he has excellent arterial flow with his findings being normal pretty much across the board. I am very pleased in that regard. His ABI on the right was 1.22 with a TBI of 0.93 and on the left was 1.29 with a TBI of 1.08. Waveforms were triphasic throughout. The patient tells me he is currently been cleaning this with either alcohol or peroxide and then applying Silvadene cream to this and a small dab and wrapping this. 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 Otherwise he is a type II diabetic. 01/11/2021 upon evaluation today patient appears to be doing well with regard to his wound. He is showing signs of improvement which is great news and overall very pleased with where things stand today. There does not appear to be any evidence of active infection at this time which is great news as well. In general I think that the patient is on the right track. His MRI which was actually ordered by Dr. Earleen Newport was negative for any signs of osteomyelitis that is great news. Overall I think that this is going ahead on the right direction with the appropriate care. 01/18/2021 upon evaluation today patient appears to be doing well currently in regard to his toe ulcer. There is a little bit of callus around the edges of the wound but nothing nearly as significant as what we previously noted. Fortunately there does not appear to be any signs of infection either which is also good news. I think that the patient is headed in the appropriate direction. 02/01/2021 upon evaluation today patient appears to be doing actually decently well in regard to his toe ulcer. This is actually not  larger than last week although last week he had much more callus covering over the wound that we had to clear away. Once we removed all that the wound was actually a bit bigger but nonetheless look clean and I was very happy with it. T oday unfortunately he does have some callus still building up this is evidence that he still having a lot of friction and pressure happening to the toe region. I explained to the patient that this is something that we definitely need to try to manage more effectively. Again I explained that in particular I believe that a total contact cast would be the best way to try to get this healed as quickly as possible. I have seen patients actually healed quite rapidly even in just a couple of weeks with this. Again is always difficult to say exactly how quickly someone will heal but nonetheless I believe that the cast is something that we need to have on the radar potentially even for next week. 02/08/2021 upon evaluation today patient's wound is actually showing signs of doing a little bit better compared to last week as far as callus buildup there is less callus than noted previously. With that being said I actually believe that he would benefit from application of the total contact cast has been discussed previously and I think that we should go ahead and see about applying that today. He is reluctantly to some degree but nonetheless in agreement with trying to get this healed and if it takes the cast he tells me he will do it. With all that being said I think the cast is going to be his optimal option and the  gold standard for getting this healed at this point. Electronic Signature(s) Signed: 02/08/2021 1:43:06 PM By: Worthy Keeler PA-C Entered By: Worthy Keeler on 02/08/2021 13:43:06 -------------------------------------------------------------------------------- Physical Exam Details Patient Name: Date of Service: [name], [place] S. 02/08/2021 9:45 A M Medical  Record Number: [id number] Patient Account Number: [id number] Date of Birth/Sex: Treating RN: 05-Aug-1955 (65 y.o. [name] Primary Care Provider: [name] Other Clinician: Referring Provider: Treating Provider/Extender: [name] in Treatment: 5 Constitutional Well-nourished and well-hydrated in no acute distress. Respiratory normal breathing without difficulty. Psychiatric this patient is able to make decisions and demonstrates good insight into disease process. Alert and Oriented x 3. pleasant and cooperative. Notes Upon inspection patient's wound bed did require some sharp debridement clear away some of the necrotic debris and callus around the edges of the wound. I did so very carefully to try to prevent him from bleeding obvious I do not want anything in the cast causing any trouble with excessive bleeding which would not be good. With all that being said I am going to have to obviously keep a close eye on things initially here. We will have to see him back in a couple of days to repeat the cast change and then subsequently I will see him next week to see how things are doing as well. My hope is that this wound will already be doing significantly better by that time. Electronic Signature(s) Signed: 02/08/2021 1:43:50 PM By: Worthy Keeler PA-C Entered By: Worthy Keeler on 02/08/2021 13:43:50 -------------------------------------------------------------------------------- Physician Orders Details Patient Name: Date of Service: [name], [place] S. 02/08/2021 9:45 A M Medical Record Number: [id number] Patient Account Number: [id number] Date of Birth/Sex: Treating RN: [birth year] (65 y.o. [name] Primary Care Provider: [name] Other Clinician: Referring Provider: Treating Provider/Extender: [name] in Treatment: 5 Verbal / Phone Orders: No Diagnosis Coding ICD-10 Coding Code  Description E11.621 Type 2 diabetes mellitus with foot ulcer L97.512 Non-pressure chronic ulcer of other part of right foot with fat layer exposed Z79.01 Long term (current) use of anticoagulants I27.82 Chronic pulmonary embolism Follow-up Appointments ppointment in 1 week. - with Vilinda Blanks. Return A ppointment in: - Friday for initial cast change Return A Bathing/ Shower/ Hygiene May shower with protection but do not get wound dressing(s) wet. Off-Loading Wound #1 Right,Plantar T Great oe Total Contact Cast to Right Lower Extremity Wound Treatment Wound #1 - T Great oe Wound Laterality: Plantar, Right Cleanser: Soap and Water 1 x Per Week/15 Days Discharge Instructions: May shower and wash wound with dial antibacterial soap and water prior to dressing change. Prim Dressing: KerraCel Ag Gelling Fiber Dressing, 2x2 in (silver alginate) 1 x Per Week/15 Days ary Discharge Instructions: Apply silver alginate to wound bed as instructed Secondary Dressing: Woven Gauze Sponges 2x2 in (Generic) 1 x Per Week/15 Days Discharge Instructions: Apply over primary dressing as directed. Secondary Dressing: Optifoam Non-Adhesive Dressing, 4x4 in (Generic) 1 x Per Week/15 Days Discharge Instructions: Apply over primary dressing cut to make foam donut Secured With: Conforming Stretch Gauze Bandage, Sterile 2x75 (in/in) (Generic) 1 x Per Week/15 Days Discharge Instructions: Secure with stretch gauze as directed. Secured With: Paper Tape, 1x10 (in/yd) (Generic) 1 x Per Week/15 Days Discharge Instructions: Secure dressing with tape as directed. Patient Medications llergies: oak A Notifications Medication Indication Start End lidocaine DOSE 1 - topical 4 % cream - 1 cream  topical Electronic Signature(s) Signed: 02/08/2021 5:35:19 PM By: Baruch Gouty RN, BSN Signed: 02/08/2021 6:37:42 PM By: Worthy Keeler PA-C Entered By: Baruch Gouty on 02/08/2021 10:45:33 Prescription  02/08/2021 -------------------------------------------------------------------------------- Legrand Como PA Patient Name: Provider: 06-16-1956 [id number] Date of Birth: NPI#: [name] [id number] Sex: DEA #: [id number] Phone #: License #: [place] Patient Address: [place] [address] [place], [place] [address] [place], [place] [address] [phone] Allergies oak Medication Medication: Route: Strength: Form: lidocaine topical 4% cream Class: TOPICAL LOCAL ANESTHETICS Dose: Frequency / Time: Indication: 1 1 cream topical Number of Refills: Number of Units: 0 Generic Substitution: Start Date: End Date: Administered at Facility: Substitution Permitted No Note to Pharmacy: Hand Signature: Date(s): Electronic Signature(s) Signed: 02/08/2021 5:35:19 PM By: Baruch Gouty RN, BSN Signed: 02/08/2021 6:37:42 PM By: Worthy Keeler PA-C Entered By: Baruch Gouty on 02/08/2021 10:45:33 -------------------------------------------------------------------------------- Problem List Details Patient Name: Date of Service: [name] Lefevre, [name] 02/08/2021 9:45 A M Medical Record Number: [id number] Patient Account Number: [id number] Date of Birth/Sex: Treating RN: 1955/12/02 (65 y.o. [name] Primary Care Provider: [name] Other Clinician: Referring Provider: Treating Provider/Extender: [name] in Treatment: 5 Active Problems ICD-10 Encounter Code Description Active Date MDM Diagnosis E11.621 Type 2 diabetes mellitus with foot ulcer 01/04/2021 No Yes L97.512 Non-pressure chronic ulcer of other part of right foot with fat layer exposed 01/04/2021 No Yes Z79.01 Long term (current) use of anticoagulants 01/04/2021 No Yes I27.82 Chronic pulmonary embolism 01/04/2021 No Yes Inactive Problems Resolved Problems Electronic Signature(s) Signed: 02/08/2021  10:12:22 AM By: Worthy Keeler PA-C Entered By: Worthy Keeler on 02/08/2021 10:12:22 -------------------------------------------------------------------------------- Progress Note Details Patient Name: Date of Service: [name], Angela Burke S. 02/08/2021 9:45 A M Medical Record Number: [id number] Patient Account Number: [id number] Date of Birth/Sex: Treating RN: 22-Jan-1956 (65 y.o. [name] Primary Care Provider: [name] Other Clinician: Referring Provider: Treating Provider/Extender: [name] in Treatment: 5 Subjective Chief Complaint Information obtained from Patient Right great toe ulcer History of Present Illness (HPI) 01/04/2021 upon evaluation today patient presents for initial evaluation here in our clinic concerning issues that he has been having with a plantar toe ulceration of the right foot. He tells me this has been going on since around January 2022 when he was on a trip to [place]. Subsequently following he then went to [place] where he had that received care multiple times during the time he was in [place]. He states that [place] seem to be the best as far as that was concerned. With that being said he was initially placed on clindamycin, then doxycycline during the course of this which he feels like did not do as well, and then subsequently he has been on clindamycin which she has 4 days left of at this point. No one has cultured anything up to this point. He has been seeing podiatry and currently Dr. Earleen Newport is to he is seeing. With that being said he does have x-rays that were really not conclusive for any signs of osteomyelitis. He does have an MRI scheduled for this coming Friday which is just 2 days away. He did have ABIs which were performed on 12/29/2020 and subsequently this did reveal that he has excellent arterial flow with his findings being normal pretty much across the board. I am very pleased in that  regard. His ABI on  the right was 1.22 with a TBI of 0.93 and on the left was 1.29 with a TBI of 1.08. Waveforms were triphasic throughout. The patient tells me he is currently been cleaning this with either alcohol or peroxide and then applying Silvadene cream to this and a small dab and wrapping this. 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 Otherwise he is a type II diabetic. 01/11/2021 upon evaluation today patient appears to be doing well with regard to his wound. He is showing signs of improvement which is great news and overall very pleased with where things stand today. There does not appear to be any evidence of active infection at this time which is great news as well. In general I think that the patient is on the right track. His MRI which was actually ordered by Dr. Earleen Newport was negative for any signs of osteomyelitis that is great news. Overall I think that this is going ahead on the right direction with the appropriate care. 01/18/2021 upon evaluation today patient appears to be doing well currently in regard to his toe ulcer. There is a little bit of callus around the edges of the wound but nothing nearly as significant as what we previously noted. Fortunately there does not appear to be any signs of infection either which is also good news. I think that the patient is headed in the appropriate direction. 02/01/2021 upon evaluation today patient appears to be doing actually decently well in regard to his toe ulcer. This is actually not larger than last week although last week he had much more callus covering over the wound that we had to clear away. Once we removed all that the wound was actually a bit bigger but nonetheless look clean and I was very happy with it. T oday unfortunately he does have some callus still building up this is evidence that he still having a lot of friction and  pressure happening to the toe region. I explained to the patient that this is something that we definitely need to try to manage more effectively. Again I explained that in particular I believe that a total contact cast would be the best way to try to get this healed as quickly as possible. I have seen patients actually healed quite rapidly even in just a couple of weeks with this. Again is always difficult to say exactly how quickly someone will heal but nonetheless I believe that the cast is something that we need to have on the radar potentially even for next week. 02/08/2021 upon evaluation today patient's wound is actually showing signs of doing a little bit better compared to last week as far as callus buildup there is less callus than noted previously. With that being said I actually believe that he would benefit from application of the total contact cast has been discussed previously and I think that we should go ahead and see about applying that today. He is reluctantly to some degree but nonetheless in agreement with trying to get this healed and if it takes the cast he tells me he will do it. With all that being said I think the cast is going to be his optimal option and the gold standard for getting this healed at this point. Objective Constitutional Well-nourished and well-hydrated in no acute distress. Vitals Time Taken: 10:06 AM, Temperature: 98.5  F, Pulse: 73 bpm, Respiratory Rate: 18 breaths/min, Blood Pressure: 108/70 mmHg, Capillary Blood Glucose: 170 mg/dl. General Notes: glucose per pt report Respiratory normal breathing without difficulty. Psychiatric this patient is able to make decisions and demonstrates good insight into disease process. Alert and Oriented x 3. pleasant and cooperative. General Notes: Upon inspection patient's wound bed did require some sharp debridement clear away some of the necrotic debris and callus around the edges of the wound. I did so very  carefully to try to prevent him from bleeding obvious I do not want anything in the cast causing any trouble with excessive bleeding which would not be good. With all that being said I am going to have to obviously keep a close eye on things initially here. We will have to see him back in a couple of days to repeat the cast change and then subsequently I will see him next week to see how things are doing as well. My hope is that this wound will already be doing significantly better by that time. Integumentary (Hair, Skin) Wound #1 status is Open. Original cause of wound was Other Lesion. The date acquired was: 06/27/2020. The wound has been in treatment 5 weeks. The wound is located on the [employer]. The wound measures 0.4cm length x 0.3cm width x 0.4cm depth; 0.094cm^2 area and 0.038cm^3 volume. There is oe Fat Layer (Subcutaneous Tissue) exposed. There is no tunneling noted, however, there is undermining starting at 12:00 and ending at 6:00 with a maximum distance of 0.4cm. There is a medium amount of sanguinous drainage noted. The wound margin is well defined and not attached to the wound base. There is large (67-100%) red granulation within the wound bed. There is no necrotic tissue within the wound bed. Assessment Active Problems ICD-10 Type 2 diabetes mellitus with foot ulcer Non-pressure chronic ulcer of other part of right foot with fat layer exposed Long term (current) use of anticoagulants Chronic pulmonary embolism Procedures Wound #1 Pre-procedure diagnosis of Wound #1 is a Diabetic Wound/Ulcer of the Lower Extremity located on the Right,Plantar T Great .Severity of Tissue Pre oe Debridement is: Fat layer exposed. There was a Selective/Open Wound Skin/Epidermis Debridement with a total area of 0.72 sq cm performed by Worthy Keeler, PA. With the following instrument(s): Curette to remove Viable and Non-Viable tissue/material. Material removed includes Callus and Skin:  Epidermis and after achieving pain control using Lidocaine 4% Topical Solution. No specimens were taken. A time out was conducted at 10:35, prior to the start of the procedure. A Minimum amount of bleeding was controlled with Pressure. The procedure was tolerated well with a pain level of 0 throughout and a pain level of 0 following the procedure. Post Debridement Measurements: 0.4cm length x 0.3cm width x 0.4cm depth; 0.038cm^3 volume. Character of Wound/Ulcer Post Debridement is improved. Severity of Tissue Post Debridement is: Fat layer exposed. Post procedure Diagnosis Wound #1: Same as Pre-Procedure Pre-procedure diagnosis of Wound #1 is a Diabetic Wound/Ulcer of the Lower Extremity located on the Right,Plantar T Great . There was a T Contact oe otal Cast Procedure by Worthy Keeler, PA. Post procedure Diagnosis Wound #1: Same as Pre-Procedure Plan Follow-up Appointments: Return Appointment in 1 week. - with Vilinda Blanks. Return Appointment in: - Friday for initial cast change Bathing/ Shower/ Hygiene: May shower with protection but do not get wound dressing(s) wet. Off-Loading: Wound #1 Right,Plantar T Great: oe T Contact Cast to Right Lower Extremity otal The following medication(s) was  prescribed: lidocaine topical 4 % cream 1 1 cream topical WOUND #1: - T Great Wound Laterality: Plantar, Right oe Cleanser: Soap and Water 1 x Per Week/15 Days Discharge Instructions: May shower and wash wound with dial antibacterial soap and water prior to dressing change. Prim Dressing: KerraCel Ag Gelling Fiber Dressing, 2x2 in (silver alginate) 1 x Per Week/15 Days ary Discharge Instructions: Apply silver alginate to wound bed as instructed Secondary Dressing: Woven Gauze Sponges 2x2 in (Generic) 1 x Per Week/15 Days Discharge Instructions: Apply over primary dressing as directed. Secondary Dressing: Optifoam Non-Adhesive Dressing, 4x4 in (Generic) 1 x Per Week/15 Days Discharge Instructions:  Apply over primary dressing cut to make foam donut Secured With: Conforming Stretch Gauze Bandage, Sterile 2x75 (in/in) (Generic) 1 x Per Week/15 Days Discharge Instructions: Secure with stretch gauze as directed. Secured With: Paper T ape, 1x10 (in/yd) (Generic) 1 x Per Week/15 Days Discharge Instructions: Secure dressing with tape as directed. 1. I would recommend again the total contact cast with the patient that was applied today and hopefully he will be doing significantly better come next week. 2. I am also can recommend that we have the patient continue to monitor for any signs of worsening such as if he has any areas that rub or any pain he should let us know hopefully however everything look good we will make changes out on Friday. We will see patient back for reevaluation in 1 week here in the clinic. If anything worsens or changes patient will contact our office for additional recommendations. Electronic Signature(s) Signed: 02/08/2021 1:44:12 PM By: Worthy Keeler PA-C Entered By: Worthy Keeler on 02/08/2021 13:44:12 -------------------------------------------------------------------------------- Total Contact Cast Details Patient Name: Date of Service: [name], [name] 02/08/2021 9:45 A M Medical Record Number: [id number] Patient Account Number: [id number] Date of Birth/Sex: Treating RN: [birth year] (65 y.o. [name] Primary Care Provider: [name] Other Clinician: Referring Provider: Treating Provider/Extender: [name] in Treatment: 5 T Contact Cast Applied for Wound Assessment: otal Wound #1 Right,Plantar T Great oe Performed By: Physician Worthy Keeler, PA Post Procedure Diagnosis Same as Pre-procedure Electronic Signature(s) Signed: 02/08/2021 5:35:19 PM By: Baruch Gouty RN, BSN Signed: 02/08/2021 6:37:42 PM By: Worthy Keeler PA-C Entered By: Baruch Gouty on 02/08/2021  10:40:22 -------------------------------------------------------------------------------- SuperBill Details Patient Name: Date of Service: [name] Lefevre, [name] 02/08/2021 Medical Record Number: [id number] Patient Account Number: [id number] Date of Birth/Sex: Treating RN: 1955-12-11 (65 y.o. 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

## 2021-02-08 NOTE — Telephone Encounter (Signed)
Received a new hem referral from Dr. Chase Caller for pulmonary embolism. [name] has been cld and scheduled to see Dr. Lorenso Courier on 9/1 at 1pm.

## 2021-02-09 DIAGNOSIS — G4733 Obstructive sleep apnea (adult) (pediatric): Secondary | ICD-10-CM

## 2021-02-09 NOTE — Progress Notes (Signed)
SYIER, BROUSE ([id number]) Visit Report for 02/08/2021 Arrival Information Details Patient Name: Date of Service: [place], [name] 02/08/2021 9:45 A M Medical Record Number: [id number] Patient Account Number: [id number] Date of Birth/Sex: Treating RN: [birth year] (65 y.o. [name] Primary Care Arinze Rivadeneira: [name] Other Clinician: Referring Adelina Collard: Treating Tiphanie Vo/Extender: [name] in Treatment: 5 Visit Information History Since Last Visit Added or deleted any medications: Yes Patient Arrived: Ambulatory Any new allergies or adverse reactions: No Arrival Time: 10:01 Had a fall or experienced change in No Accompanied By: spouse activities of daily living that may affect Transfer Assistance: None risk of falls: Patient Identification Verified: Yes Signs or symptoms of abuse/neglect since No Secondary Verification Process Completed: Yes last visito Patient Requires Transmission-Based Precautions: No Hospitalized since last visit: No Patient Has Alerts: Yes Implantable device outside of the clinic No Patient Alerts: Patient on Blood Thinner excluding ABI's R=1.22 L=1.29 cellular tissue based products placed in the center since last visit: Has Dressing in Place as Prescribed: Yes Has Footwear/Offloading in Place as Yes Prescribed: Right: Surgical Shoe with Pressure Relief Insole Pain Present Now: No Electronic Signature(s) Signed: 02/08/2021 5:35:19 PM By: Baruch Gouty RN, BSN Entered By: Baruch Gouty on 02/08/2021 10:03:46 -------------------------------------------------------------------------------- Encounter Discharge Information Details Patient Name: Date of Service: [name], [name] 02/08/2021 9:45 A M Medical Record Number: [id number] Patient Account Number: [id number] Date of Birth/Sex: Treating RN: 03-09-1956 (65 y.o. [name] Primary Care Krystl Wickware: [name] Other  Clinician: Referring Jahmad Petrich: Treating Norleen Xie/Extender: [name] in Treatment: 5 Encounter Discharge Information Items Post Procedure Vitals Discharge Condition: Stable Temperature (F): 98.5 Ambulatory Status: Ambulatory Pulse (bpm): 73 Discharge Destination: Home Respiratory Rate (breaths/min): 18 Transportation: Private Auto Blood Pressure (mmHg): 108/70 Accompanied By: spouse Schedule Follow-up Appointment: Yes Clinical Summary of Care: Patient Declined Electronic Signature(s) Signed: 02/08/2021 5:35:19 PM By: Baruch Gouty RN, BSN Entered By: Baruch Gouty on 02/08/2021 10:50:51 -------------------------------------------------------------------------------- Lower Extremity Assessment Details Patient Name: Date of Service: [name], [place] S. 02/08/2021 9:45 A M Medical Record Number: [id number] Patient Account Number: [id number] Date of Birth/Sex: Treating RN: [birth year] (65 y.o. [name] Primary Care Nami Strawder: [name] Other Clinician: Referring Kaytelyn Glore: Treating Marguerette Sheller/Extender: [name] in Treatment: 5 Edema Assessment Assessed: [Left: No] [Right: No] Edema: [Left: Ye] [Right: s] Calf Left: Right: Point of Measurement: From Medial Instep 45.3 cm Ankle Left: Right: Point of Measurement: From Medial Instep 29.1 cm Vascular Assessment Pulses: Dorsalis Pedis Palpable: [Right:Yes] Electronic Signature(s) Signed: 02/08/2021 5:35:19 PM By: Baruch Gouty RN, BSN Entered By: Baruch Gouty on 02/08/2021 10:10:23 -------------------------------------------------------------------------------- Multi-Disciplinary Care Plan Details Patient Name: Date of Service: [name], [name] 02/08/2021 9:45 A M Medical Record Number: [id number] Patient Account Number: [id number] Date of Birth/Sex: Treating RN: [birth year] (65 y.o. [name] Primary Care Soledad Budreau: [name] Other Clinician: Referring Olamae Ferrara: Treating Khristin Keleher/Extender: [name] in Treatment: 5 Multidisciplinary Care Plan reviewed with physician Active Inactive Nutrition Nursing Diagnoses: Impaired glucose control: actual or potential Potential for alteratiion in Nutrition/Potential for imbalanced nutrition Goals: Patient/caregiver will maintain therapeutic glucose control Date Initiated: 01/04/2021 Target Resolution Date: 03/01/2021 Goal Status: Active Interventions: Assess HgA1c results as ordered upon admission and as needed Assess patient nutrition upon admission and as needed per policy Provide education on elevated blood sugars and impact on wound  healing Treatment Activities: Patient referred to Primary Care Physician for further nutritional evaluation : 01/04/2021 Notes: Wound/Skin Impairment Nursing Diagnoses: Impaired tissue integrity Knowledge deficit related to ulceration/compromised skin integrity Goals: Patient/caregiver will verbalize understanding of skin care regimen Date Initiated: 01/04/2021 Target Resolution Date: 03/01/2021 Goal Status: Active Ulcer/skin breakdown will have a volume reduction of 30% by week 4 Date Initiated: 01/04/2021 Date Inactivated: 02/08/2021 Target Resolution Date: 02/01/2021 Unmet Reason: offloading Goal Status: Unmet noncompliance Ulcer/skin breakdown will have a volume reduction of 50% by week 8 Date Initiated: 02/08/2021 Target Resolution Date: 03/01/2021 Goal Status: Active Interventions: Assess patient/caregiver ability to obtain necessary supplies Assess patient/caregiver ability to perform ulcer/skin care regimen upon admission and as needed Assess ulceration(s) every visit Provide education on ulcer and skin care Treatment Activities: Skin care regimen initiated : 01/04/2021 Topical wound management initiated : 01/04/2021 Notes: Electronic Signature(s) Signed: 02/08/2021 5:35:19 PM By:  Baruch Gouty RN, BSN Entered By: Baruch Gouty on 02/08/2021 10:28:35 -------------------------------------------------------------------------------- Pain Assessment Details Patient Name: Date of Service: [name], [name] 02/08/2021 9:45 A M Medical Record Number: [id number] Patient Account Number: [id number] Date of Birth/Sex: Treating RN: November 21, 1955 (65 y.o. [name] Primary Care Kinzy Weyers: [name] Other Clinician: Referring Dameisha Tschida: Treating Cashel Bellina/Extender: [name] in Treatment: 5 Active Problems Location of Pain Severity and Description of Pain Patient Has Paino No Site Locations Rate the pain. Rate the pain. Current Pain Level: 0 Pain Management and Medication Current Pain Management: Electronic Signature(s) Signed: 02/08/2021 5:35:19 PM By: Baruch Gouty RN, BSN Entered By: Baruch Gouty on 02/08/2021 10:09:48 -------------------------------------------------------------------------------- Patient/Caregiver Education Details Patient Name: Date of Service: [name], MA RSHA Natalia Leatherwood 8/24/2022andnbsp9:45 A M Medical Record Number: [id number] Patient Account Number: [id number] Date of Birth/Gender: Treating RN: 25-Dec-1955 (65 y.o. [name] Primary Care Physician: [name] Other Clinician: Referring Physician: Treating Physician/Extender: Luvenia Heller, Penelope Galas in Treatment: 5 Education Assessment Education Provided To: Patient Education Topics Provided Elevated Blood Sugar/ Impact on Healing: Methods: Explain/Verbal Responses: Reinforcements needed, State content correctly Offloading: Methods: Explain/Verbal Responses: Reinforcements needed, State content correctly Wound/Skin Impairment: Methods: Explain/Verbal Responses: Reinforcements needed, State content correctly Electronic Signature(s) Signed: 02/08/2021 5:35:19 PM By: Baruch Gouty RN, BSN Entered By:  Baruch Gouty on 02/08/2021 10:29:07 -------------------------------------------------------------------------------- Wound Assessment Details Patient Name: Date of Service: [name], [name] 02/08/2021 9:45 A M Medical Record Number: [id number] Patient Account Number: [id number] Date of Birth/Sex: Treating RN: 08/06/1955 (65 y.o. [name] Primary Care Aalaysia Liggins: [name] Other Clinician: Referring Randle Shatzer: Treating Bexley Laubach/Extender: [name] in Treatment: 5 Wound Status Wound Number: 1 Primary Diabetic Wound/Ulcer of the Lower Extremity Etiology: Wound Location: Right, Plantar T Great oe Wound Open Wounding Event: Other Lesion Status: Date Acquired: 06/27/2020 Comorbid Sleep Apnea, Coronary Artery Disease, Hypertension, Type II Weeks Of Treatment: 5 History: Diabetes, Osteoarthritis, Neuropathy Clustered Wound: No Photos Wound Measurements Length: (cm) 0.4 Width: (cm) 0.3 Depth: (cm) 0.4 Area: (cm) 0.094 Volume: (cm) 0.038 % Reduction in Area: 40.1% % Reduction in Volume: 51.9% Epithelialization: Small (1-33%) Tunneling: No Undermining: Yes Starting Position (o'clock): 12 Ending Position (o'clock): 6 Maximum Distance: (cm) 0.4 Wound Description Classification: Grade 1 Wound Margin: Well defined, not attached Exudate Amount: Medium Exudate Type: Sanguinous Exudate Color: red Foul Odor After Cleansing: No Slough/Fibrino No Wound Bed Granulation Amount: Large (67-100%) Exposed Structure Granulation Quality: Red Fascia Exposed: No Necrotic Amount: None Present (0%)  Fat Layer (Subcutaneous Tissue) Exposed: Yes Tendon Exposed: No Muscle Exposed: No Joint Exposed: No Bone Exposed: No Treatment Notes Wound #1 (Toe Great) Wound Laterality: Plantar, Right Cleanser Soap and Water Discharge Instruction: May shower and wash wound with dial antibacterial soap and water prior to dressing change. Peri-Wound  Care Topical Primary Dressing KerraCel Ag Gelling Fiber Dressing, 2x2 in (silver alginate) Discharge Instruction: Apply silver alginate to wound bed as instructed Secondary Dressing Woven Gauze Sponges 2x2 in Discharge Instruction: Apply over primary dressing as directed. Optifoam Non-Adhesive Dressing, 4x4 in Discharge Instruction: Apply over primary dressing cut to make foam donut Secured With Conforming Stretch Gauze Bandage, Sterile 2x75 (in/in) Discharge Instruction: Secure with stretch gauze as directed. Paper Tape, 1x10 (in/yd) Discharge Instruction: Secure dressing with tape as directed. Compression Wrap Compression Stockings Add-Ons Notes TCC Electronic Signature(s) Signed: 02/08/2021 5:35:19 PM By: Baruch Gouty RN, BSN Signed: 02/09/2021 7:44:17 AM By: Sandre Kitty Entered By: Sandre Kitty on 02/08/2021 10:37:17 -------------------------------------------------------------------------------- Vitals Details Patient Name: Date of Service: [name], [name] 02/08/2021 9:45 A M Medical Record Number: [id number] Patient Account Number: [id number] Date of Birth/Sex: Treating RN: 1956-01-13 (65 y.o. [name] Primary Care Gatsby Chismar: [name] Other Clinician: Referring Lanell Dubie: Treating Kymberly Blomberg/Extender: [name] in Treatment: 5 Vital Signs Time Taken: 10:06 Temperature (F): 98.5 Pulse (bpm): 73 Respiratory Rate (breaths/min): 18 Blood Pressure (mmHg): 108/70 Capillary Blood Glucose (mg/dl): 170 Reference Range: 80 - 120 mg / dl Notes glucose per pt report Electronic Signature(s) Signed: 02/08/2021 5:35:19 PM By: Baruch Gouty RN, BSN Entered By: Baruch Gouty on 02/08/2021 10:06:47

## 2021-02-09 NOTE — Telephone Encounter (Signed)
Called pt he reports that he started taking [place] on 02/08/21.  He is scheduled for f/u labs on 02/15/21.  All questions answered.

## 2021-02-10 ENCOUNTER — Other Ambulatory Visit: Payer: Self-pay

## 2021-02-10 ENCOUNTER — Encounter (HOSPITAL_BASED_OUTPATIENT_CLINIC_OR_DEPARTMENT_OTHER): Payer: Medicare Other | Admitting: Internal Medicine

## 2021-02-10 DIAGNOSIS — E11621 Type 2 diabetes mellitus with foot ulcer: Secondary | ICD-10-CM

## 2021-02-10 DIAGNOSIS — I2782 Chronic pulmonary embolism: Secondary | ICD-10-CM | POA: Diagnosis not present

## 2021-02-10 DIAGNOSIS — L97512 Non-pressure chronic ulcer of other part of right foot with fat layer exposed: Secondary | ICD-10-CM | POA: Diagnosis not present

## 2021-02-10 DIAGNOSIS — Z7901 Long term (current) use of anticoagulants: Secondary | ICD-10-CM | POA: Diagnosis not present

## 2021-02-10 NOTE — Addendum Note (Signed)
Addended by: Amado Coe on: 02/10/2021 11:15 AM   Modules accepted: Orders

## 2021-02-10 NOTE — Progress Notes (Signed)
[name], [name] ([name]) Visit Report for 02/10/2021 Chief Complaint Document Details Patient Name: Date of Service: [name] [name] S. 02/10/2021 11:00 A M Medical Record Number: [name] Patient Account Number: [id number] Date of Birth/Sex: Treating RN: 1955-12-06 (65 y.o. [name] Primary Care Provider: Hayden [name] Other Clinician: Referring Provider: Treating Provider/Extender: [name] in Treatment: 5 Information Obtained from: Patient Chief Complaint Right great toe ulcer Electronic Signature(s) Signed: 02/10/2021 12:33:14 PM By: Kalman Shan DO Entered By: Kalman Shan on 02/10/2021 12:25:56 -------------------------------------------------------------------------------- HPI Details Patient Name: Date of Service: [name] Lefevre, [name] 02/10/2021 11:00 A M Medical Record Number: [name] Patient Account Number: [id number] Date of Birth/Sex: Treating RN: 16-May-1956 (65 y.o. [name] Primary Care Provider: Hayden [name] Other Clinician: Referring Provider: Treating Provider/Extender: [name] in Treatment: 5 History of Present Illness HPI Description: 01/04/2021 upon evaluation today patient presents for initial evaluation here in our clinic concerning issues that he has been having with a plantar toe ulceration of the right foot. He tells me this has been going on since around January 2022 when he was on a trip to [place]. Subsequently following he then went to [place] where he had that received care multiple times during the time he was in [place]. He states that [place] seem to be the best as far as that was concerned. With that being said he was initially placed on clindamycin, then doxycycline during the course of this which he feels like did not do as well, and then subsequently he has been on clindamycin which she has 4 days left of at this point. No one has cultured  anything up to this point. He has been seeing podiatry and currently [name] is to he is seeing. With that being said he does have x-rays that were really not conclusive for any signs of osteomyelitis. He does have an MRI scheduled for this coming Friday which is just 2 days away. He did have ABIs which were performed on 12/29/2020 and subsequently this did reveal that he has excellent arterial flow with his findings being normal pretty much across the board. I am very pleased in that regard. His ABI on the right was 1.22 with a TBI of 0.93 and on the left was 1.29 with a TBI of 1.08. Waveforms were triphasic throughout. The patient tells me he is currently been cleaning this with either alcohol or peroxide and then applying Silvadene cream to this and a small dab and wrapping this. 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 Otherwise he is a type II diabetic. 01/11/2021 upon evaluation today patient appears to be doing well with regard to his wound. He is showing signs of improvement which is great news and overall very pleased with where things stand today. There does not appear to be any evidence of active infection at this time which is great news as well. In general I think that the patient is on the right track. His MRI which was actually ordered by [name] was negative for any signs of osteomyelitis that is great news. Overall I think that this is going ahead on the right direction with the appropriate care. 01/18/2021 upon evaluation today patient appears to be doing well currently in regard to his toe ulcer. There is a little bit  of callus around the edges of the wound but nothing nearly as significant as what we previously noted. Fortunately there does not appear to be any signs of infection either which is also good news. I think that the patient is headed in the  appropriate direction. 02/01/2021 upon evaluation today patient appears to be doing actually decently well in regard to his toe ulcer. This is actually not larger than last week although last week he had much more callus covering over the wound that we had to clear away. Once we removed all that the wound was actually a bit bigger but nonetheless look clean and I was very happy with it. T oday unfortunately he does have some callus still building up this is evidence that he still having a lot of friction and pressure happening to the toe region. I explained to the patient that this is something that we definitely need to try to manage more effectively. Again I explained that in particular I believe that a total contact cast would be the best way to try to get this healed as quickly as possible. I have seen patients actually healed quite rapidly even in just a couple of weeks with this. Again is always difficult to say exactly how quickly someone will heal but nonetheless I believe that the cast is something that we need to have on the radar potentially even for next week. 02/08/2021 upon evaluation today patient's wound is actually showing signs of doing a little bit better compared to last week as far as callus buildup there is less callus than noted previously. With that being said I actually believe that he would benefit from application of the total contact cast has been discussed previously and I think that we should go ahead and see about applying that today. He is reluctantly to some degree but nonetheless in agreement with trying to get this healed and if it takes the cast he tells me he will do it. With all that being said I think the cast is going to be his optimal option and the gold standard for getting this healed at this point. 8/26; patient presents for his first cast change. He reports multiple issues with the cast in the past 2 days. His main concerns are bruising to his leg and  ankle. He states that the cast shifts around his foot. He also states he has had close encounters with falls in the past 2 days. Overall he feels well and denies signs of infection. Electronic Signature(s) Signed: 02/10/2021 12:33:14 PM By: Kalman Shan DO Entered By: Kalman Shan on 02/10/2021 12:27:54 -------------------------------------------------------------------------------- Physical Exam Details Patient Name: Date of Service: [name] Lefevre, [name] 02/10/2021 11:00 A M Medical Record Number: [id number] Patient Account Number: [id number] Date of Birth/Sex: Treating RN: 1956/06/12 (65 y.o. [name] Primary Care Provider: Hayden [name] Other Clinician: Referring Provider: Treating Provider/Extender: [name] in Treatment: 5 Constitutional respirations regular, non-labored and within target range for patient.Marland Kitchen Psychiatric pleasant and cooperative. Notes Right lower extremity: A few areas of tenderness on palpation to the upper leg and ankle. No signs of bruising. No erythema, increased warmth noted. No signs of infection. T the right great toe there is an open wound with granulation tissue and circumferential callus. No signs of infection. Overall no concerning o features today. Electronic Signature(s) Signed: 02/10/2021 12:33:14 PM By: Kalman Shan DO Entered By: Kalman Shan on 02/10/2021 12:29:33 -------------------------------------------------------------------------------- Physician Orders Details Patient Name: Date of  Service: [name] Lefevre, [name] 02/10/2021 11:00 A M Medical Record Number: [name] Patient Account Number: [id number] Date of Birth/Sex: Treating RN: 1956/01/13 (65 y.o. [name] Primary Care Provider: Hayden [name] Other Clinician: Referring Provider: Treating Provider/Extender: [name] in Treatment: 5 Verbal / Phone Orders: No Diagnosis  Coding ICD-10 Coding Code Description E11.621 Type 2 diabetes mellitus with foot ulcer L97.512 Non-pressure chronic ulcer of other part of right foot with fat layer exposed Z79.01 Long term (current) use of anticoagulants I27.82 Chronic pulmonary embolism Follow-up Appointments ppointment in 1 week. - with Vilinda Blanks. Return A ppointment in: - Friday for initial cast change Return A Bathing/ Shower/ Hygiene May shower with protection but do not get wound dressing(s) wet. Off-Loading Wound #1 Right,Plantar T Great oe Open toe surgical shoe to: - right foot with felt donut to offload Wound Treatment Wound #1 - T Great oe Wound Laterality: Plantar, Right Cleanser: Soap and Water 1 x Per Day/15 Days Discharge Instructions: May shower and wash wound with dial antibacterial soap and water prior to dressing change. Prim Dressing: KerraCel Ag Gelling Fiber Dressing, 2x2 in (silver alginate) 1 x Per Day/15 Days ary Discharge Instructions: Apply silver alginate to wound bed as instructed Secondary Dressing: Woven Gauze Sponges 2x2 in (Generic) 1 x Per Day/15 Days Discharge Instructions: Apply over primary dressing as directed. Secondary Dressing: Optifoam Non-Adhesive Dressing, 4x4 in (Generic) 1 x Per Day/15 Days Discharge Instructions: Apply over primary dressing cut to make foam donut Secured With: Conforming Stretch Gauze Bandage, Sterile 2x75 (in/in) (Generic) 1 x Per Day/15 Days Discharge Instructions: Secure with stretch gauze as directed. Secured With: Paper Tape, 1x10 (in/yd) (Generic) 1 x Per Day/15 Days Discharge Instructions: Secure dressing with tape as directed. Electronic Signature(s) Signed: 02/10/2021 12:33:14 PM By: Kalman Shan DO Entered By: Kalman Shan on 02/10/2021 12:29:44 -------------------------------------------------------------------------------- Problem List Details Patient Name: Date of Service: [name] Lefevre, [name] 02/10/2021 11:00 A M Medical  Record Number: [name] Patient Account Number: [id number] Date of Birth/Sex: Treating RN: February 27, 1956 (65 y.o. [name] Primary Care Provider: Hayden [name] Other Clinician: Referring Provider: Treating Provider/Extender: [name] in Treatment: 5 Active Problems ICD-10 Encounter Code Description Active Date MDM Diagnosis E11.621 Type 2 diabetes mellitus with foot ulcer 01/04/2021 No Yes L97.512 Non-pressure chronic ulcer of other part of right foot with fat layer exposed 01/04/2021 No Yes Z79.01 Long term (current) use of anticoagulants 01/04/2021 No Yes I27.82 Chronic pulmonary embolism 01/04/2021 No Yes Inactive Problems Resolved Problems Electronic Signature(s) Signed: 02/10/2021 12:33:14 PM By: Kalman Shan DO Entered By: Kalman Shan on 02/10/2021 12:25:45 -------------------------------------------------------------------------------- Progress Note Details Patient Name: Date of Service: [name] Lefevre, [name] 02/10/2021 11:00 A M Medical Record Number: [name] Patient Account Number: [id number] Date of Birth/Sex: Treating RN: 1956-04-07 (65 y.o. [name] Primary Care Provider: Hayden [name] Other Clinician: Referring Provider: Treating Provider/Extender: [name] in Treatment: 5 Subjective Chief Complaint Information obtained from Patient Right great toe ulcer History of Present Illness (HPI) 01/04/2021 upon evaluation today patient presents for initial evaluation here in our clinic concerning issues that he has been having with a plantar toe ulceration of the right foot. He tells me this has been going on since around January 2022 when he was on a trip to [place]. Subsequently following he then went to [place] where he had that received care multiple times during the time  he was in [place]. He states that [place] seem to be the best as far as that was concerned. With that being  said he was initially placed on clindamycin, then doxycycline during the course of this which he feels like did not do as well, and then subsequently he has been on clindamycin which she has 4 days left of at this point. No one has cultured anything up to this point. He has been seeing podiatry and currently [name] is to he is seeing. With that being said he does have x-rays that were really not conclusive for any signs of osteomyelitis. He does have an MRI scheduled for this coming Friday which is just 2 days away. He did have ABIs which were performed on 12/29/2020 and subsequently this did reveal that he has excellent arterial flow with his findings being normal pretty much across the board. I am very pleased in that regard. His ABI on the right was 1.22 with a TBI of 0.93 and on the left was 1.29 with a TBI of 1.08. Waveforms were triphasic throughout. The patient tells me he is currently been cleaning this with either alcohol or peroxide and then applying Silvadene cream to this and a small dab and wrapping this. 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 Otherwise he is a type II diabetic. 01/11/2021 upon evaluation today patient appears to be doing well with regard to his wound. He is showing signs of improvement which is great news and overall very pleased with where things stand today. There does not appear to be any evidence of active infection at this time which is great news as well. In general I think that the patient is on the right track. His MRI which was actually ordered by [name] was negative for any signs of osteomyelitis that is great news. Overall I think that this is going ahead on the right direction with the appropriate care. 01/18/2021 upon evaluation today patient appears to be doing well currently in regard to his toe ulcer. There is a little bit of  callus around the edges of the wound but nothing nearly as significant as what we previously noted. Fortunately there does not appear to be any signs of infection either which is also good news. I think that the patient is headed in the appropriate direction. 02/01/2021 upon evaluation today patient appears to be doing actually decently well in regard to his toe ulcer. This is actually not larger than last week although last week he had much more callus covering over the wound that we had to clear away. Once we removed all that the wound was actually a bit bigger but nonetheless look clean and I was very happy with it. T oday unfortunately he does have some callus still building up this is evidence that he still having a lot of friction and pressure happening to the toe region. I explained to the patient that this is something that we definitely need to try to manage more effectively. Again I explained that in particular I believe that a total contact cast would be the best way to try to get this healed as quickly as possible. I have seen patients actually healed quite rapidly even in just a couple of weeks with this. Again is always difficult to say exactly how quickly someone will heal but nonetheless I believe that the  cast is something that we need to have on the radar potentially even for next week. 02/08/2021 upon evaluation today patient's wound is actually showing signs of doing a little bit better compared to last week as far as callus buildup there is less callus than noted previously. With that being said I actually believe that he would benefit from application of the total contact cast has been discussed previously and I think that we should go ahead and see about applying that today. He is reluctantly to some degree but nonetheless in agreement with trying to get this healed and if it takes the cast he tells me he will do it. With all that being said I think the cast is going to be his  optimal option and the gold standard for getting this healed at this point. 8/26; patient presents for his first cast change. He reports multiple issues with the cast in the past 2 days. His main concerns are bruising to his leg and ankle. He states that the cast shifts around his foot. He also states he has had close encounters with falls in the past 2 days. Overall he feels well and denies signs of infection. Patient History Information obtained from Patient. Family History Cancer - Father, Hypertension - Siblings, Lung Disease - Siblings, No family history of Diabetes, Heart Disease, Hereditary Spherocytosis, Kidney Disease, Seizures, Stroke, Thyroid Problems, Tuberculosis. Social History Former smoker - Quit 1993, Marital Status - Single, Alcohol Use - Daily - red wine 2-3 glasses, Drug Use - Prior History, Caffeine Use - Daily - coffee, soda. Medical History Respiratory Patient has history of Sleep Apnea Cardiovascular Patient has history of Coronary Artery Disease, Hypertension Endocrine Patient has history of Type II Diabetes Musculoskeletal Patient has history of Osteoarthritis Neurologic Patient has history of Neuropathy Medical A Surgical History Notes nd Respiratory Blood Clot Right Lung Cardiovascular AAA Objective Constitutional respirations regular, non-labored and within target range for patient.. Vitals Time Taken: 11:06 AM, Temperature: 98.47 F, Pulse: 74 bpm, Respiratory Rate: 17 breaths/min, Blood Pressure: 106/71 mmHg, Capillary Blood Glucose: 135 mg/dl. Psychiatric pleasant and cooperative. General Notes: Right lower extremity: A few areas of tenderness on palpation to the upper leg and ankle. No signs of bruising. No erythema, increased warmth noted. No signs of infection. T the right great toe there is an open wound with granulation tissue and circumferential callus. No signs of infection. Overall no o concerning features today. Integumentary (Hair,  Skin) Wound #1 status is Open. Original cause of wound was Other Lesion. The date acquired was: 06/27/2020. The wound has been in treatment 5 weeks. The wound is located on the [employer]. The wound measures 0.5cm length x 0.4cm width x 0.5cm depth; 0.157cm^2 area and 0.079cm^3 volume. There is oe Fat Layer (Subcutaneous Tissue) exposed. There is no tunneling noted, however, there is undermining starting at 12:00 and ending at 12:00 with a maximum distance of 0.6cm. There is a medium amount of sanguinous drainage noted. The wound margin is well defined and not attached to the wound base. There is large (67-100%) red granulation within the wound bed. There is no necrotic tissue within the wound bed. Assessment Active Problems ICD-10 Type 2 diabetes mellitus with foot ulcer Non-pressure chronic ulcer of other part of right foot with fat layer exposed Long term (current) use of anticoagulants Chronic pulmonary embolism Patient's wound is stable. Patient has multiple complaints about the cast and I will not replace this today. No concerning features on exam. I recommended  continuing with silver alginate and aggressive offloading to the area. Patient will have follow-up next week. Plan Follow-up Appointments: Return Appointment in 1 week. - with Vilinda Blanks. Return Appointment in: - Friday for initial cast change Bathing/ Shower/ Hygiene: May shower with protection but do not get wound dressing(s) wet. Off-Loading: Wound #1 Right,Plantar T Great: oe Open toe surgical shoe to: - right foot with felt donut to offload WOUND #1: - T Great Wound Laterality: Plantar, Right oe Cleanser: Soap and Water 1 x Per Day/15 Days Discharge Instructions: May shower and wash wound with dial antibacterial soap and water prior to dressing change. Prim Dressing: KerraCel Ag Gelling Fiber Dressing, 2x2 in (silver alginate) 1 x Per Day/15 Days ary Discharge Instructions: Apply silver alginate to wound bed  as instructed Secondary Dressing: Woven Gauze Sponges 2x2 in (Generic) 1 x Per Day/15 Days Discharge Instructions: Apply over primary dressing as directed. Secondary Dressing: Optifoam Non-Adhesive Dressing, 4x4 in (Generic) 1 x Per Day/15 Days Discharge Instructions: Apply over primary dressing cut to make foam donut Secured With: Conforming Stretch Gauze Bandage, Sterile 2x75 (in/in) (Generic) 1 x Per Day/15 Days Discharge Instructions: Secure with stretch gauze as directed. Secured With: Paper Tape, 1x10 (in/yd) (Generic) 1 x Per Day/15 Days Discharge Instructions: Secure dressing with tape as directed. 1. Continue silver alginate 2. Aggressive offloading 3. Follow-up in 1 week Electronic Signature(s) Signed: 02/10/2021 12:33:14 PM By: Kalman Shan DO Entered By: Kalman Shan on 02/10/2021 12:32:39 -------------------------------------------------------------------------------- HxROS Details Patient Name: Date of Service: [name] Lefevre, [name] 02/10/2021 11:00 A M Medical Record Number: [name] Patient Account Number: [id number] Date of Birth/Sex: Treating RN: [birth year] (65 y.o. [name] Primary Care Provider: Hayden [name] Other Clinician: Referring Provider: Treating Provider/Extender: [name] in Treatment: 5 Information Obtained From Patient Respiratory Medical History: Positive for: Sleep Apnea Past Medical History Notes: Blood Clot Right Lung Cardiovascular Medical History: Positive for: Coronary Artery Disease; Hypertension Past Medical History Notes: AAA Endocrine Medical History: Positive for: Type II Diabetes Time with diabetes: 10-15 Treated with: Insulin Blood sugar tested every day: Yes Tested : every morning Musculoskeletal Medical History: Positive for: Osteoarthritis Neurologic Medical History: Positive for: Neuropathy Immunizations Pneumococcal Vaccine: Received Pneumococcal Vaccination:  No Implantable Devices None Family and Social History Cancer: Yes - Father; Diabetes: No; Heart Disease: No; Hereditary Spherocytosis: No; Hypertension: Yes - Siblings; Kidney Disease: No; Lung Disease: Yes - Siblings; Seizures: No; Stroke: No; Thyroid Problems: No; Tuberculosis: No; Former smoker - Quit 1993; Marital Status - Single; Alcohol Use: Daily - red wine 2-3 glasses; Drug Use: Prior History; Caffeine Use: Daily - coffee, soda; Financial Concerns: No; Food, Clothing or Shelter Needs: No; Support System Lacking: No; Transportation Concerns: No Electronic Signature(s) Signed: 02/10/2021 12:33:14 PM By: Kalman Shan DO Signed: 02/10/2021 1:08:00 PM By: Baruch Gouty RN, BSN Entered By: Kalman Shan on 02/10/2021 12:28:01 -------------------------------------------------------------------------------- [place] Details Patient Name: Date of Service: [name] Lefevre, [name] 02/10/2021 Medical Record Number: [name] Patient Account Number: [id number] Date of Birth/Sex: Treating RN: 1956/06/12 (65 y.o. [name] Primary Care Provider: Hayden [name] Other Clinician: Referring Provider: Treating Provider/Extender: [name] in Treatment: 5 Diagnosis Coding ICD-10 Codes Code Description [phone] Type 2 diabetes mellitus with foot ulcer L97.512 Non-pressure chronic ulcer of other part of right foot with fat layer exposed Z79.01 Long term (current) use of anticoagulants I27.82 Chronic pulmonary embolism Facility Procedures CPT4 Code: [id number] Description: [phone] - WOUND  CARE VISIT-LEV 3 EST PT Modifier: Quantity: 1 Physician Procedures : CPT4 Code Description Modifier [id number] - WC PHYS LEVEL 3 - EST PT ICD-10 Diagnosis Description E11.621 Type 2 diabetes mellitus with foot ulcer L97.512 Non-pressure chronic ulcer of other part of right foot with fat layer exposed Z79.01 Long term  (current) use of anticoagulants I27.82 Chronic  pulmonary embolism Quantity: 1 Electronic Signature(s) Signed: 02/10/2021 12:33:14 PM By: Kalman Shan DO Entered By: Kalman Shan on 02/10/2021 12:32:57

## 2021-02-12 ENCOUNTER — Encounter: Payer: Self-pay | Admitting: Podiatry

## 2021-02-13 ENCOUNTER — Ambulatory Visit (INDEPENDENT_AMBULATORY_CARE_PROVIDER_SITE_OTHER): Payer: Medicare Other | Admitting: Podiatry

## 2021-02-13 ENCOUNTER — Other Ambulatory Visit: Payer: Self-pay

## 2021-02-13 DIAGNOSIS — L03115 Cellulitis of right lower limb: Secondary | ICD-10-CM

## 2021-02-13 DIAGNOSIS — L97512 Non-pressure chronic ulcer of other part of right foot with fat layer exposed: Secondary | ICD-10-CM

## 2021-02-13 DIAGNOSIS — E11621 Type 2 diabetes mellitus with foot ulcer: Secondary | ICD-10-CM

## 2021-02-14 ENCOUNTER — Other Ambulatory Visit: Payer: Self-pay | Admitting: Podiatry

## 2021-02-14 ENCOUNTER — Encounter: Payer: Self-pay | Admitting: Podiatry

## 2021-02-14 ENCOUNTER — Telehealth: Payer: Self-pay | Admitting: *Deleted

## 2021-02-14 MED ORDER — PANTOPRAZOLE SODIUM 40 MG PO TBEC: 40.0000 mg | DELAYED_RELEASE_TABLET | Freq: Every day | ORAL | 0 refills | Status: DC

## 2021-02-14 MED ORDER — DOXYCYCLINE HYCLATE 100 MG PO TABS
100.0000 mg | ORAL_TABLET | Freq: Two times a day (BID) | ORAL | 0 refills | Status: DC
Start: 2021-02-14 — End: 2021-04-25

## 2021-02-14 NOTE — Telephone Encounter (Signed)
Returned call to patient, informed that medication has been approved and sent to pharmacy on file,verbalized understanding.

## 2021-02-14 NOTE — Telephone Encounter (Signed)
Sorry about that. It must have slipped past yesterday. Thanks Dr. Jacqualyn Posey

## 2021-02-14 NOTE — Telephone Encounter (Signed)
Santiago Glad w/ [facility] is calling for status of 2 prescriptions that were supposed to be sent to pharmacy(doxycycline, pantoprozole),please advise. [phone]

## 2021-02-15 ENCOUNTER — Ambulatory Visit: Payer: Medicare Other | Admitting: *Deleted

## 2021-02-15 ENCOUNTER — Ambulatory Visit (INDEPENDENT_AMBULATORY_CARE_PROVIDER_SITE_OTHER): Payer: Medicare Other | Admitting: Primary Care

## 2021-02-15 ENCOUNTER — Ambulatory Visit (INDEPENDENT_AMBULATORY_CARE_PROVIDER_SITE_OTHER): Payer: Medicare Other | Admitting: Internal Medicine

## 2021-02-15 ENCOUNTER — Encounter (HOSPITAL_BASED_OUTPATIENT_CLINIC_OR_DEPARTMENT_OTHER): Payer: Medicare Other | Admitting: Physician Assistant

## 2021-02-15 ENCOUNTER — Encounter: Payer: Self-pay | Admitting: Primary Care

## 2021-02-15 ENCOUNTER — Other Ambulatory Visit: Payer: Self-pay

## 2021-02-15 VITALS — BP 118/76 | HR 76 | Temp 98.0°F | Ht 72.0 in | Wt 300.0 lb

## 2021-02-15 DIAGNOSIS — R0600 Dyspnea, unspecified: Secondary | ICD-10-CM

## 2021-02-15 DIAGNOSIS — R0609 Other forms of dyspnea: Secondary | ICD-10-CM

## 2021-02-15 DIAGNOSIS — E11621 Type 2 diabetes mellitus with foot ulcer: Secondary | ICD-10-CM | POA: Diagnosis not present

## 2021-02-15 DIAGNOSIS — I10 Essential (primary) hypertension: Secondary | ICD-10-CM

## 2021-02-15 DIAGNOSIS — Z86711 Personal history of pulmonary embolism: Secondary | ICD-10-CM

## 2021-02-15 DIAGNOSIS — J438 Other emphysema: Secondary | ICD-10-CM | POA: Diagnosis not present

## 2021-02-15 DIAGNOSIS — G4733 Obstructive sleep apnea (adult) (pediatric): Secondary | ICD-10-CM | POA: Diagnosis not present

## 2021-02-15 DIAGNOSIS — R059 Cough, unspecified: Secondary | ICD-10-CM | POA: Diagnosis not present

## 2021-02-15 DIAGNOSIS — I509 Heart failure, unspecified: Secondary | ICD-10-CM

## 2021-02-15 LAB — BASIC METABOLIC PANEL
BUN/Creatinine Ratio: 9 — ABNORMAL LOW (ref 10–24)
BUN: 10 mg/dL (ref 8–27)
CO2: 24 mmol/L (ref 20–29)
Calcium: 8.5 mg/dL — ABNORMAL LOW (ref 8.6–10.2)
Chloride: 99 mmol/L (ref 96–106)
Creatinine, Ser: 1.17 mg/dL (ref 0.76–1.27)
Glucose: 195 mg/dL — ABNORMAL HIGH (ref 65–99)
Potassium: 3.9 mmol/L (ref 3.5–5.2)
Sodium: 139 mmol/L (ref 134–144)
eGFR: 69 mL/min/{1.73_m2} (ref >59)

## 2021-02-15 LAB — POCT EXHALED NITRIC OXIDE: FeNO level (ppb): 19

## 2021-02-15 MED ORDER — ADVAIR HFA 115-21 MCG/ACT IN AERO: 2.0000 | INHALATION_SPRAY | Freq: Two times a day (BID) | RESPIRATORY_TRACT | 3 refills | Status: DC

## 2021-02-15 NOTE — Progress Notes (Signed)
Full PFT performed today. °

## 2021-02-15 NOTE — Assessment & Plan Note (Signed)
-   D-dimer was normal, follow up with hematology for anticoagulation duration

## 2021-02-15 NOTE — Patient Instructions (Addendum)
Pulmonary function testing showed moderate restrictive defect with positive bronchodilator. You can see this with asthma, obesity and possibly post covid. She had no evidence of COPD. Recommend trial low dose steriod +BD inhaler.   D-dimer was normal, follow up with hematology for anticoagulation duration   We are contacting [facility] for overnight oximetry results and will be in touch once we receive them   Cardiac testing: 02/03/21 Echocardiogram-EF 60 to 65%, moderate left ventricular hypertrophy.  Grade 1 diastolic dysfunction.  Moderate mitral annular calcification.    Imaging: 02/08/21 HRCT- No evidence of fibrotic interstitial lung disease. Bandlike scarring and volume loss of the left greater than right lung bases. Mild emphysema. Coronary artery disease  Orders: FENO  Please request ONO results from [facility]   Follow-up: 4 weeks virtual visit with Eustaquio Maize NP

## 2021-02-15 NOTE — Patient Instructions (Signed)
Full PFT performed today. °

## 2021-02-15 NOTE — Assessment & Plan Note (Addendum)
-   Possible asthmatic component along with mild emphysema. CAD and diastolic dysfunction likely also contributing to dyspnea symptoms. He is following with cardiology. We have put him on trial Advair HFA 131mg and will follow-up virtually in 4 weeks

## 2021-02-15 NOTE — Progress Notes (Addendum)
[name], [name] ([id number]) Visit Report for 02/15/2021 Chief Complaint Document Details Patient Name: Date of Service: [name], [name] 02/15/2021 9:30 A M Medical Record Number: [id number] Patient Account Number: [id number] Date of Birth/Sex: Treating RN: [birth year] (65 y.o. [name] Primary Care Provider: Hayden [name] Other Clinician: Referring Provider: Treating Provider/Extender: [name] in Treatment: 6 Information Obtained from: Patient Chief Complaint Right great toe ulcer Electronic Signature(s) Signed: 02/15/2021 9:59:25 AM By: [name] Keeler PA-C Entered By: [name] on 02/15/2021 09:59:25 -------------------------------------------------------------------------------- HPI Details Patient Name: Date of Service: [name] [name], [place] [name] 02/15/2021 9:30 A M Medical Record Number: [id number] Patient Account Number: [id number] Date of Birth/Sex: Treating RN: 03/21/1956 (65 y.o. [name] Primary Care Provider: Hayden [name] Other Clinician: Referring Provider: Treating Provider/Extender: [name] in Treatment: 6 History of Present Illness HPI Description: 01/04/2021 upon evaluation today patient presents for initial evaluation here in our clinic concerning issues that he has been having with a plantar toe ulceration of the right foot. He tells me this has been going on since around January 2022 when he was on a trip to [place]. Subsequently following he then went to [place] where he had that received care multiple times during the time he was in [place]. He states that [place] seem to be the best as far as that was concerned. With that being said he was initially placed on clindamycin, then doxycycline during the course of this which he feels like did not do as well, and then subsequently he has been on clindamycin which she has 4 days left of at this point. No one has cultured anything  up to this point. He has been seeing podiatry and currently [name] is to he is seeing. With that being said he does have x-rays that were really not conclusive for any signs of osteomyelitis. He does have an MRI scheduled for this coming Friday which is just 2 days away. He did have ABIs which were performed on 12/29/2020 and subsequently this did reveal that he has excellent arterial flow with his findings being normal pretty much across the board. I am very pleased in that regard. His ABI on the right was 1.22 with a TBI of 0.93 and on the left was 1.29 with a TBI of 1.08. Waveforms were triphasic throughout. The patient tells me he is currently been cleaning this with either alcohol or peroxide and then applying Silvadene cream to this and a small dab and wrapping this. 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 Otherwise he is a type II diabetic. 01/11/2021 upon evaluation today patient appears to be doing well with regard to his wound. He is showing signs of improvement which is great news and overall very pleased with where things stand today. There does not appear to be any evidence of active infection at this time which is great news as well. In general I think that the patient is on the right track. His MRI which was actually ordered by [name] was negative for any signs of osteomyelitis that is great news. Overall I think that this is going ahead on the right direction with the appropriate care. 01/18/2021 upon evaluation today patient appears to be doing well currently in regard to his toe ulcer. There  is a little bit of callus around the edges of the wound but nothing nearly as significant as what we previously noted. Fortunately there does not appear to be any signs of infection either which is also good news. I think that the patient is headed in the appropriate  direction. 02/01/2021 upon evaluation today patient appears to be doing actually decently well in regard to his toe ulcer. This is actually not larger than last week although last week he had much more callus covering over the wound that we had to clear away. Once we removed all that the wound was actually a bit bigger but nonetheless look clean and I was very happy with it. T oday unfortunately he does have some callus still building up this is evidence that he still having a lot of friction and pressure happening to the toe region. I explained to the patient that this is something that we definitely need to try to manage more effectively. Again I explained that in particular I believe that a total contact cast would be the best way to try to get this healed as quickly as possible. I have seen patients actually healed quite rapidly even in just a couple of weeks with this. Again is always difficult to say exactly how quickly someone will heal but nonetheless I believe that the cast is something that we need to have on the radar potentially even for next week. 02/08/2021 upon evaluation today patient's wound is actually showing signs of doing a little bit better compared to last week as far as callus buildup there is less callus than noted previously. With that being said I actually believe that he would benefit from application of the total contact cast has been discussed previously and I think that we should go ahead and see about applying that today. He is reluctantly to some degree but nonetheless in agreement with trying to get this healed and if it takes the cast he tells me he will do it. With all that being said I think the cast is going to be his optimal option and the gold standard for getting this healed at this point. 8/26; patient presents for his first cast change. He reports multiple issues with the cast in the past 2 days. His main concerns are bruising to his leg and ankle. He states  that the cast shifts around his foot. He also states he has had close encounters with falls in the past 2 days. Overall he feels well and denies signs of infection. 02/05/2021 upon evaluation today patient appears to be doing poorly in regard to his wound. Unfortunately he really is not showing a lot of signs of improvement which he has not had the cast on since Friday after that first 2 days the cast change he really was not keen on putting the cast back on. Nonetheless unfortunately he had some issues with a little bit of rubbing we could definitely pad some of the areas of the right now he has an infection somewhere definitely not can be putting the cast back on. He showed me the pictures from Friday which actually did not appear to be doing too poorly at all from the standpoint of infection. I did not see anything at that time. Subsequently over the next couple of days since Friday by Sunday he had some signs of cellulitis on the distal portion of his foot interestingly this does not include the toe where the wound is. By Monday this was spreading further  up the foot and he did end up going to see Dr. Amalia Hailey who was to send in doxycycline for him. I am not sure what happened but it appears Dr. Amalia Hailey did not get that prescription sent in on Monday patient contacted the office on Tuesday morning and they got him the antibiotic Tuesday morning I think [name] sent this in for him. Subsequently he took 2 doses of this yesterday and then 1 dose this morning. Nonetheless he does have some areas of streaking coming further up the foot that was not there even yesterday on the picture that he showed me. This does have me concerned for the possibility of the infection spreading. Nonetheless he has not developed any systemic symptoms such as malaise, body aches, fevers, chills, nausea, vomiting, or diarrhea. Nonetheless if any of these occur I explained that he should definitely go to the ER on the market as  well today where the redness is and if anything goes outside of that area he should go to the ER. Electronic Signature(s) Signed: 02/15/2021 11:11:44 AM By: [name] Keeler PA-C Entered By: [name] on 02/15/2021 11:11:44 -------------------------------------------------------------------------------- Physical Exam Details Patient Name: Date of Service: [name] [name], [place] S. 02/15/2021 9:30 A M Medical Record Number: [id number] Patient Account Number: [id number] Date of Birth/Sex: Treating RN: [birth year] (65 y.o. [name] Primary Care Provider: Hayden [name] Other Clinician: Referring Provider: Treating Provider/Extender: [name] in Treatment: 6 Constitutional Well-nourished and well-hydrated in no acute distress. Respiratory normal breathing without difficulty. Psychiatric this patient is able to make decisions and demonstrates good insight into disease process. Alert and Oriented x 3. pleasant and cooperative. Notes Upon inspection patient's toe wound still shows significant necrotic tissue to be honest this really is not doing as well as I would like to see. Fortunately there does not appear to be any evidence of infection immediately surrounding the wound which is the interesting thing that seems to have almost been something that might of emanating from between his first and second toe where he has some skin breakdown not really a wound but it is somewhat wet I believe it may have been more of an athlete's foot breakdown that then developed into more of a cellulitis type situation due to some skin cracking and entry of the bacteria into the underlying soft tissue. Nonetheless either way he is on a good antibiotic at having doxycycline and a culture was taken and we will see with the results of the culture show. Electronic Signature(s) Signed: 02/15/2021 11:12:28 AM By: [name] Keeler PA-C Entered By: [name] on 02/15/2021  11:12:28 -------------------------------------------------------------------------------- Physician Orders Details Patient Name: Date of Service: [name] [name], [place] [name] 02/15/2021 9:30 A M Medical Record Number: [id number] Patient Account Number: [id number] Date of Birth/Sex: Treating RN: 1956/01/29 (65 y.o. [name], [place].Reding Primary Care Provider: Other Clinician: Hayden [name] Referring Provider: Treating Provider/Extender: [name] in Treatment: 6 Verbal / Phone Orders: No Diagnosis Coding ICD-10 Coding Code Description E11.621 Type 2 diabetes mellitus with foot ulcer L97.512 Non-pressure chronic ulcer of other part of right foot with fat layer exposed Z79.01 Long term (current) use of anticoagulants I27.82 Chronic pulmonary embolism Follow-up Appointments ppointment in 1 week. - with Vilinda Blanks. Return A Patient to purchase over the counter antifungal powder and apply daily between the toes to right foot. Closely monitor redness. if you develop fever, nausea, vomiting, increased  pain, or body aches to the emergency department. If redness increases passed the marked line go to the Emergency Department for evaluation and the need for IV antibiotics. Bathing/ Shower/ Hygiene May shower with protection but do not get wound dressing(s) wet. Off-Loading Wound #1 Right,Plantar T Great oe Open toe surgical shoe to: - right foot with felt donut to offload Wound Treatment Wound #1 - T Great oe Wound Laterality: Plantar, Right Cleanser: Soap and Water 1 x Per Day/15 Days Discharge Instructions: May shower and wash wound with dial antibacterial soap and water prior to dressing change. Peri-Wound Care: antifungal powder 1 x Per Day/15 Days Discharge Instructions: **APPLY ONLY TO RIGHT FOOT BETWEEN TOES DAILY.*** Prim Dressing: KerraCel Ag Gelling Fiber Dressing, 2x2 in (silver alginate) 1 x Per Day/15 Days ary Discharge Instructions: Apply silver alginate  to wound bed as instructed Secondary Dressing: Woven Gauze Sponges 2x2 in (Generic) 1 x Per Day/15 Days Discharge Instructions: Apply over primary dressing as directed. Secondary Dressing: Optifoam Non-Adhesive Dressing, 4x4 in (Generic) 1 x Per Day/15 Days Discharge Instructions: Apply over primary dressing cut to make foam donut Secured With: Conforming Stretch Gauze Bandage, Sterile 2x75 (in/in) (Generic) 1 x Per Day/15 Days Discharge Instructions: Secure with stretch gauze as directed. Secured With: Paper Tape, 1x10 (in/yd) (Generic) 1 x Per Day/15 Days Discharge Instructions: Secure dressing with tape as directed. Electronic Signature(s) Signed: 02/15/2021 5:21:16 PM By: [name] Keeler PA-C Signed: 02/15/2021 6:06:32 PM By: Deon Pilling Entered By: Deon Pilling on 02/15/2021 10:32:49 -------------------------------------------------------------------------------- Problem List Details Patient Name: Date of Service: [name], [name] 02/15/2021 9:30 A M Medical Record Number: [id number] Patient Account Number: [id number] Date of Birth/Sex: Treating RN: [birth year] (64 y.o. [name] Primary Care Provider: Hayden [name] Other Clinician: Referring Provider: Treating Provider/Extender: [name] in Treatment: 6 Active Problems ICD-10 Encounter Code Description Active Date MDM Diagnosis E11.621 Type 2 diabetes mellitus with foot ulcer 01/04/2021 No Yes L97.512 Non-pressure chronic ulcer of other part of right foot with fat layer exposed 01/04/2021 No Yes Z79.01 Long term (current) use of anticoagulants 01/04/2021 No Yes I27.82 Chronic pulmonary embolism 01/04/2021 No Yes Inactive Problems Resolved Problems Electronic Signature(s) Signed: 02/15/2021 9:59:05 AM By: [name] Keeler PA-C Entered By: [name] on 02/15/2021 09:59:05 -------------------------------------------------------------------------------- Progress Note  Details Patient Name: Date of Service: [name] [name], [place] S. 02/15/2021 9:30 A M Medical Record Number: [id number] Patient Account Number: [id number] Date of Birth/Sex: Treating RN: 1955-12-24 (65 y.o. [name] Primary Care Provider: Hayden [name] Other Clinician: Referring Provider: Treating Provider/Extender: [name] in Treatment: 6 Subjective Chief Complaint Information obtained from Patient Right great toe ulcer History of Present Illness (HPI) 01/04/2021 upon evaluation today patient presents for initial evaluation here in our clinic concerning issues that he has been having with a plantar toe ulceration of the right foot. He tells me this has been going on since around January 2022 when he was on a trip to [place]. Subsequently following he then went to [place] where he had that received care multiple times during the time he was in [place]. He states that [place] seem to be the best as far as that was concerned. With that being said he was initially placed on clindamycin, then doxycycline during the course of this which he feels like did not do as well, and then subsequently he has been on clindamycin which she has 4 days  left of at this point. No one has cultured anything up to this point. He has been seeing podiatry and currently [name] is to he is seeing. With that being said he does have x-rays that were really not conclusive for any signs of osteomyelitis. He does have an MRI scheduled for this coming Friday which is just 2 days away. He did have ABIs which were performed on 12/29/2020 and subsequently this did reveal that he has excellent arterial flow with his findings being normal pretty much across the board. I am very pleased in that regard. His ABI on the right was 1.22 with a TBI of 0.93 and on the left was 1.29 with a TBI of 1.08. Waveforms were triphasic throughout. The patient tells me he is currently been cleaning this with  either alcohol or peroxide and then applying Silvadene cream to this and a small dab and wrapping this. 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 Otherwise he is a type II diabetic. 01/11/2021 upon evaluation today patient appears to be doing well with regard to his wound. He is showing signs of improvement which is great news and overall very pleased with where things stand today. There does not appear to be any evidence of active infection at this time which is great news as well. In general I think that the patient is on the right track. His MRI which was actually ordered by [name] was negative for any signs of osteomyelitis that is great news. Overall I think that this is going ahead on the right direction with the appropriate care. 01/18/2021 upon evaluation today patient appears to be doing well currently in regard to his toe ulcer. There is a little bit of callus around the edges of the wound but nothing nearly as significant as what we previously noted. Fortunately there does not appear to be any signs of infection either which is also good news. I think that the patient is headed in the appropriate direction. 02/01/2021 upon evaluation today patient appears to be doing actually decently well in regard to his toe ulcer. This is actually not larger than last week although last week he had much more callus covering over the wound that we had to clear away. Once we removed all that the wound was actually a bit bigger but nonetheless look clean and I was very happy with it. T oday unfortunately he does have some callus still building up this is evidence that he still having a lot of friction and pressure happening to the toe region. I explained to the patient that this is something that we definitely need to try to manage more effectively. Again I explained that in  particular I believe that a total contact cast would be the best way to try to get this healed as quickly as possible. I have seen patients actually healed quite rapidly even in just a couple of weeks with this. Again is always difficult to say exactly how quickly someone will heal but nonetheless I believe that the cast is something that we need to have on the radar potentially even for next week. 02/08/2021 upon evaluation today patient's wound is actually showing signs of doing a little bit better compared to last week as far as callus buildup there is less callus than noted previously. With that being said I actually believe that he would  benefit from application of the total contact cast has been discussed previously and I think that we should go ahead and see about applying that today. He is reluctantly to some degree but nonetheless in agreement with trying to get this healed and if it takes the cast he tells me he will do it. With all that being said I think the cast is going to be his optimal option and the gold standard for getting this healed at this point. 8/26; patient presents for his first cast change. He reports multiple issues with the cast in the past 2 days. His main concerns are bruising to his leg and ankle. He states that the cast shifts around his foot. He also states he has had close encounters with falls in the past 2 days. Overall he feels well and denies signs of infection. 02/05/2021 upon evaluation today patient appears to be doing poorly in regard to his wound. Unfortunately he really is not showing a lot of signs of improvement which he has not had the cast on since Friday after that first 2 days the cast change he really was not keen on putting the cast back on. Nonetheless unfortunately he had some issues with a little bit of rubbing we could definitely pad some of the areas of the right now he has an infection somewhere definitely not can be putting the cast back on. He  showed me the pictures from Friday which actually did not appear to be doing too poorly at all from the standpoint of infection. I did not see anything at that time. Subsequently over the next couple of days since Friday by Sunday he had some signs of cellulitis on the distal portion of his foot interestingly this does not include the toe where the wound is. By Monday this was spreading further up the foot and he did end up going to see Dr. Amalia Hailey who was to send in doxycycline for him. I am not sure what happened but it appears Dr. Amalia Hailey did not get that prescription sent in on Monday patient contacted the office on Tuesday morning and they got him the antibiotic Tuesday morning I think [name] sent this in for him. Subsequently he took 2 doses of this yesterday and then 1 dose this morning. Nonetheless he does have some areas of streaking coming further up the foot that was not there even yesterday on the picture that he showed me. This does have me concerned for the possibility of the infection spreading. Nonetheless he has not developed any systemic symptoms such as malaise, body aches, fevers, chills, nausea, vomiting, or diarrhea. Nonetheless if any of these occur I explained that he should definitely go to the ER on the market as well today where the redness is and if anything goes outside of that area he should go to the ER. Objective Constitutional Well-nourished and well-hydrated in no acute distress. Vitals Time Taken: 9:40 AM, Temperature: 98.1 F, Pulse: 73 bpm, Respiratory Rate: 20 breaths/min, Blood Pressure: 127/78 mmHg. Respiratory normal breathing without difficulty. Psychiatric this patient is able to make decisions and demonstrates good insight into disease process. Alert and Oriented x 3. pleasant and cooperative. General Notes: Upon inspection patient's toe wound still shows significant necrotic tissue to be honest this really is not doing as well as I would like to  see. Fortunately there does not appear to be any evidence of infection immediately surrounding the wound which is the interesting thing that seems to have almost been  something that might of emanating from between his first and second toe where he has some skin breakdown not really a wound but it is somewhat wet I believe it may have been more of an athlete's foot breakdown that then developed into more of a cellulitis type situation due to some skin cracking and entry of the bacteria into the underlying soft tissue. Nonetheless either way he is on a good antibiotic at having doxycycline and a culture was taken and we will see with the results of the culture show. Integumentary (Hair, Skin) Wound #1 status is Open. Original cause of wound was Other Lesion. The date acquired was: 06/27/2020. The wound has been in treatment 6 weeks. The wound is located on the [employer]. The wound measures 0.4cm length x 0.3cm width x 0.3cm depth; 0.094cm^2 area and 0.028cm^3 volume. There is oe Fat Layer (Subcutaneous Tissue) exposed. There is no tunneling or undermining noted. There is a medium amount of sanguinous drainage noted. The wound margin is well defined and not attached to the wound base. There is large (67-100%) red granulation within the wound bed. There is no necrotic tissue within the wound bed. General Notes: patient refuses to measure depth and undermining. right foot edema, redness, hot to touch noted only from base of right great toe to dorsal foot. No redness noted to periwound or right great toe. Assessment Active Problems ICD-10 Type 2 diabetes mellitus with foot ulcer Non-pressure chronic ulcer of other part of right foot with fat layer exposed Long term (current) use of anticoagulants Chronic pulmonary embolism Plan Follow-up Appointments: Return Appointment in 1 week. - with Vilinda Blanks. Patient to purchase over the counter antifungal powder and apply daily between the toes to  right foot. Closely monitor redness. if you develop fever, nausea, vomiting, increased pain, or body aches to the emergency department. If redness increases passed the marked line go to the Emergency Department for evaluation and the need for IV antibiotics. Bathing/ Shower/ Hygiene: May shower with protection but do not get wound dressing(s) wet. Off-Loading: Wound #1 Right,Plantar T Great: oe Open toe surgical shoe to: - right foot with felt donut to offload WOUND #1: - T Great Wound Laterality: Plantar, Right oe Cleanser: Soap and Water 1 x Per Day/15 Days Discharge Instructions: May shower and wash wound with dial antibacterial soap and water prior to dressing change. Peri-Wound Care: antifungal powder 1 x Per Day/15 Days Discharge Instructions: **APPLY ONLY TO RIGHT FOOT BETWEEN TOES DAILY.*** Prim Dressing: KerraCel Ag Gelling Fiber Dressing, 2x2 in (silver alginate) 1 x Per Day/15 Days ary Discharge Instructions: Apply silver alginate to wound bed as instructed Secondary Dressing: Woven Gauze Sponges 2x2 in (Generic) 1 x Per Day/15 Days Discharge Instructions: Apply over primary dressing as directed. Secondary Dressing: Optifoam Non-Adhesive Dressing, 4x4 in (Generic) 1 x Per Day/15 Days Discharge Instructions: Apply over primary dressing cut to make foam donut Secured With: Conforming Stretch Gauze Bandage, Sterile 2x75 (in/in) (Generic) 1 x Per Day/15 Days Discharge Instructions: Secure with stretch gauze as directed. Secured With: Paper T ape, 1x10 (in/yd) (Generic) 1 x Per Day/15 Days Discharge Instructions: Secure dressing with tape as directed. 1. I did advise the patient to be very cautious about if anything were to worsen if he develops any fevers, chills, nausea, vomiting, or diarrhea he should go to the ER ASAP and I did mark where the redness was right now if it spreads outside of this region he should also go to the ER  ASAP. 2. With regard to the wound we will get a  continue with the silver alginate I think that is causing any issues and we need to try to keep things as dry as possible especially in light of the athlete's foot issue here. 3. I am also can recommend that we have the patient continue with using some topical antifungal powder. He can get this over-the-counter and I think that is appropriate. I do believe that he should do that as soon as possible. 4. With regard to debridements again I still think that he is going require some debridement in regard to the toe ulcer but right now again I did forego that in light of the infection we will see how things proceed over the next week. When we get the results of the culture back we can make changes as necessary as far as that treatment is concerned but right now again I do not know anything that needs to be done differently other than the doxycycline which again is a very good antibiotic oral. We will see patient back for reevaluation in 1 week here in the clinic. If anything worsens or changes patient will contact our office for additional recommendations. Electronic Signature(s) Signed: 02/15/2021 11:14:35 AM By: [name] Keeler PA-C Entered By: [name] on 02/15/2021 11:14:35 -------------------------------------------------------------------------------- SuperBill Details Patient Name: Date of Service: [name] [name], [name] 02/15/2021 Medical Record Number: [id number] Patient Account Number: [id number] Date of Birth/Sex: Treating RN: 22-Aug-1955 (65 y.o. [name], Meta.Reding Primary Care Provider: Hayden [name] Other Clinician: Referring Provider: Treating Provider/Extender: [name] in Treatment: 6 Diagnosis Coding ICD-10 Codes Code Description [phone] Type 2 diabetes mellitus with foot ulcer L97.512 Non-pressure chronic ulcer of other part of right foot with fat layer exposed Z79.01 Long term (current) use of anticoagulants I27.82 Chronic pulmonary  embolism Facility Procedures CPT4 Code: [id number] Description: 99213 - WOUND CARE VISIT-LEV 3 EST PT Modifier: Quantity: 1 Physician Procedures : CPT4 Code Description Modifier [id number] - WC PHYS LEVEL 4 - EST PT ICD-10 Diagnosis Description E11.621 Type 2 diabetes mellitus with foot ulcer L97.512 Non-pressure chronic ulcer of other part of right foot with fat layer exposed Z79.01 Long term  (current) use of anticoagulants I27.82 Chronic pulmonary embolism Quantity: 1 Electronic Signature(s) Signed: 02/15/2021 11:14:49 AM By: [name] Keeler PA-C Entered By: [name] on 02/15/2021 11:14:48

## 2021-02-15 NOTE — Assessment & Plan Note (Signed)
-   Continue CPAP nightly, awaiting ONO results

## 2021-02-15 NOTE — Progress Notes (Signed)
$'@Patient'H$  ID: [name], male    DOB: 12/09/1955, 65 y.o.   MRN: [id number]  Chief Complaint  Patient presents with   Follow-up    PFT review.     Referring provider: [name], MD  HPI: 65 year old male, former smoker quit 1993 (80-pack-year history).  Past medical history significant for hypertension, heart failure, coronary artery disease, elevated diaphragm, obstructive sleep apnea, type 2 diabetes, fatty liver, hyperlipidemia, obesity.  Patient of Dr. Chase Caller, in for consult for dyspnea on 02/02/2021.  Previous [facility] pulmonary encounter:  02/02/2021 -   Chief Complaint  Patient presents with   Consult    Former pt last seen 01/20/16.  Pt states that he has had problems with SOB x6 months that happens with exertion.     HPI [name] 65 y.o. -personally not seen in 5 years.  Therefore there is a new consult.  He is here with his wife.  Since I last saw him he is now retired.  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  He tells me for the last 6 months or so he has had insidious onset of shortness of breath present on exertion such as walking from the car to the front desk of this office.  Relieved by rest.  Since its onset it is stable.  No associated chest pain.  There is no associated cough or wheezing.  No paroxysmal nocturnal dyspnea.  No orthopnea no weight gain.  Cardiologist has increased Lasix for pedal edema.    He had a stress test several years ago that was normal but he had low ejection fraction.  Most recently seen Dr. Ezra Sites at [facility] cardiologist who is getting an echo on him.  He also has a longstanding history of sleep apnea.  He no longer has a sleep doctor but uses auto CPAP which he thinks is mostly set at 12.  There is no oxygen use at night.  He also has a wound on the plantar aspect of his right hallux for which he sees podiatry.  He has knee issues.  He has obesity, AAA, OSA on CPAP, diabetes, hypertension.  In the midst of all this he had  a routine CT scan of the chest with [facility] on December 16, 2020 and this showed a small pulmonary embolism on the right side without any RV strain.  He was started on outpatient Xarelto by his primary care physician.  He was never hospitalized for this never on oxygen for this.  He wants to discuss this as well.  He and his wife have questions on severity of pulmonary embolism.  He wanted to know the size of it on the CT scan.  The CT scan was sent for upload to PACS system but I do not visualize the image.  He is currently on full dose Xarelto.  The only scan that I can visualize is the he 2017 CT scan that looks clear upon my personal visualization.   Simple office walk 185 feet x  3 laps goal with forehead probe 02/02/2021   O2 used ra  Number laps completed 3  Comments about pace avg  Resting Pulse Ox/HR 99% and 74/min  Final Pulse Ox/HR 97% and 94/min  Desaturated </= 88% no  Desaturated <= 3% points no  Got Tachycardic >/= 90/min yes  Symptoms at end of test Mild dyspnea  Miscellaneous comments x     02/15/2021- Interim hx  Presents today for 2-week follow-up with  PFTs. He reports getting winded with activity but recovered with rest. He has a rare cough. He underwent high-resolution CAT scan this month that showed no evidence of fibrotic interstitial lung disease. He had some bandlike scarring and volume loss to lung bases along with mild emphysema and coronary artery disease. Echocardiogram consistent with heart failure with preserved ejection fraction. He is on lasix '40mg'$  twice daily. Overnight oximetry on CPAP was completed 2 days ago and results are pending. He has been referred to hematology to discuss length of anticoagulation for hx PE. He is currently being treated for cellulitis with doxycycline. No fevers.    Pulmonary function test 02/15/2021 PFT-FVC 3.33 (67%), FEV1 3.80 (75%), ratio 84, TLC 78%, DLCO 30.12 (105%) Moderate restrictive defect with positive  bronchodilator.  Cardiac testing: 02/03/21 Echocardiogram-EF 60 to 65%, moderate left ventricular hypertrophy.  Grade 1 diastolic dysfunction.  Moderate mitral annular calcification.    Imaging: 02/08/21 HRCT- No evidence of fibrotic interstitial lung disease. Bandlike scarring and volume loss of the left greater than right lung bases. Mild emphysema. Coronary artery disease  02/15/2021 FENO- 19   Allergies  Allergen Reactions   Erythromycin Nausea Only   Penicillins Nausea Only    Upset stomach   Quercus Robur Itching    Immunization History  Administered Date(s) Administered   Influenza, High Dose Seasonal PF 04/30/2018   Influenza,inj,Quad PF,6+ Mos 06/30/2016, 03/14/2017   Moderna Sars-Covid-2 Vaccination 10/14/2020   PFIZER(Purple Top)SARS-COV-2 Vaccination 09/10/2019, 10/05/2019   Pneumococcal Polysaccharide-23 10/09/2012   Tdap 02/09/2015    Past Medical History:  Diagnosis Date   Abdominal aortic aneurysm (AAA), 30-34 mm diameter (HCC) 06/01/2017   Nml w/ greatest dm 3 cm US Aorta 06/2016, Abd [place] 05/01/2017 showed infrarenal dilatation at 3.4 cm - rec repeat US in 3 yrs.   Arthritis    Cervical disc disease 12/26/2011   Coronary artery disease involving native coronary artery of native heart without angina pectoris 12/01/2016   He had coronary angiography in 2011. There was irregularity within the LAD with up to 30% narrowing. The myocardial perfusion imaging done 11/07/2015 did not demonstrate any evidence of ischemia with a low risk nuclear stress test other than EF estimated at 47%. Chest CT 01/25/2016 showed diffuse coronary artery calcifications with heavy calcifications in the LAD. Pt seen by cardiology Dr. Linard Millers    Diabetes mellitus    Difficult intubation 04/05/2014   Dyslipidemia    Family history of anesthesia complication    pt states took days for his father to awaken after mask was used    Fatty liver 06/01/2017   [place] 05/01/2017   GERD (gastroesophageal  reflux disease)    has been having acid reflux since recent endoscopy    H/O hiatal hernia    repair with lap band, now has ventral hernia midline abd   Hearing loss-aides 03/26/2012   Hyperlipidemia    Hypertension    Morbid obesity ([place])    OSA (obstructive sleep apnea)    Rotator cuff tear    Seasonal allergies    Shortness of breath    pt states related to high BP meds with extended walking or climbling stairs   Sleep apnea     Tobacco History: Social History   Tobacco Use  Smoking Status Former   Packs/day: 4.00   Years: 20.00   Pack years: 80.00   Types: Cigarettes   Start date: 07/19/1973   Quit date: 12/13/1991   Years since quitting: 29.1  Smokeless Tobacco Never  Counseling given: Not Answered   Outpatient Medications Prior to Visit  Medication Sig Dispense Refill   dapagliflozin propanediol (FARXIGA) 10 MG TABS tablet Take 1 tablet (10 mg total) by mouth daily before breakfast. 90 tablet 3   diltiazem (TIAZAC) 300 MG 24 hr capsule Take 300 mg by mouth daily.     doxycycline (VIBRA-TABS) 100 MG tablet Take 1 tablet (100 mg total) by mouth 2 (two) times daily. 20 tablet 0   fluticasone (FLONASE) 50 MCG/ACT nasal spray Place 2 sprays into both nostrils daily. 16 g 2   furosemide (LASIX) 40 MG tablet Take 1 tablet (40 mg total) by mouth 2 (two) times daily. (Patient taking differently: Take 80 mg by mouth daily.) 180 tablet 3   glucose blood (TRUETEST TEST) test strip Use as instructed 100 each 2   insulin degludec (TRESIBA FLEXTOUCH) 200 UNIT/ML FlexTouch Pen Inject 104 Units into the skin daily.     Insulin Pen Needle (B-D UF III MINI PEN NEEDLES) 31G X 5 MM MISC USE AS DIRECTED ONCE A DAY 100 each 11   lisinopril (ZESTRIL) 40 MG tablet daily.     Multiple Vitamin (MULTIVITAMIN) tablet Take 1 tablet by mouth every morning.      NEOMYCIN-POLYMYXIN-HYDROCORTISONE (CORTISPORIN) 1 % SOLN OTIC solution as needed.     pantoprazole (PROTONIX) 40 MG tablet Take 1 tablet  (40 mg total) by mouth daily. 30 tablet 0   Potassium Chloride ER 20 MEQ TBCR Take 40 mEq by mouth daily.     rosuvastatin (CRESTOR) 20 MG tablet Take 1 tablet (20 mg total) by mouth daily. 90 tablet 1   tamsulosin (FLOMAX) 0.4 MG CAPS capsule Take 1 capsule (0.4 mg total) by mouth daily after supper. 90 capsule 0   XARELTO 20 MG TABS tablet Take 20 mg by mouth daily.     ketoconazole (NIZORAL) 2 % cream Apply 1 application topically 2 (two) times daily. (Patient not taking: No sig reported) 60 g 2   No facility-administered medications prior to visit.      Review of Systems  Review of Systems  Constitutional: Negative.   HENT: Negative.    Respiratory:  Positive for cough and shortness of breath. Negative for chest tightness and wheezing.   Cardiovascular:  Positive for leg swelling.    Physical Exam  BP 118/76 (BP Location: Left Arm, Patient Position: Sitting, Cuff Size: Normal)   Pulse 76   Temp 98 F (36.7 C) (Oral)   Ht 6' (1.829 m)   Wt 300 lb (136.1 kg)   SpO2 97%   BMI 40.69 kg/m  Physical Exam Constitutional:      Appearance: Normal appearance.  HENT:     Head: Normocephalic and atraumatic.  Cardiovascular:     Rate and Rhythm: Normal rate and regular rhythm.     Comments: + Trace BLE edema Pulmonary:     Effort: Pulmonary effort is normal.     Breath sounds: Normal breath sounds. No wheezing, rhonchi or rales.  Musculoskeletal:        General: Normal range of motion.  Neurological:     General: No focal deficit present.     Mental Status: He is alert and oriented to person, place, and time. Mental status is at baseline.  Psychiatric:        Mood and Affect: Mood normal.        Behavior: Behavior normal.        Thought Content: Thought content normal.  Judgment: Judgment normal.     Lab Results:  CBC    Component Value Date/Time   WBC 6.2 08/15/2018 1001   WBC 9.8 10/01/2017 1803   WBC 4.5 11/24/2014 1352   RBC 4.97 08/15/2018 1001   RBC  4.90 10/01/2017 1803   RBC 4.60 11/24/2014 1352   HGB 16.5 08/15/2018 1001   HCT 46.7 08/15/2018 1001   PLT 219 08/15/2018 1001   MCV 94 08/15/2018 1001   MCH 33.2 (H) 08/15/2018 1001   MCH 32.4 (A) 10/01/2017 1803   MCH 32.6 11/24/2014 1352   MCHC 35.3 08/15/2018 1001   MCHC 33.5 10/01/2017 1803   MCHC 34.5 11/24/2014 1352   RDW 12.2 08/15/2018 1001   LYMPHSABS 1.8 08/15/2018 1001   MONOABS 0.4 11/24/2014 1352   EOSABS 0.1 08/15/2018 1001   BASOSABS 0.0 08/15/2018 1001    BMET    Component Value Date/Time   NA 139 02/15/2021 1115   K 3.9 02/15/2021 1115   CL 99 02/15/2021 1115   CO2 24 02/15/2021 1115   GLUCOSE 195 (H) 02/15/2021 1115   GLUCOSE 236 (H) 01/03/2016 1505   BUN 10 02/15/2021 1115   CREATININE 1.17 02/15/2021 1115   CREATININE 1.33 (H) 01/03/2016 1505   CALCIUM 8.5 (L) 02/15/2021 1115   GFRNONAA 65 08/15/2018 1001   GFRNONAA 60 10/21/2013 1546   GFRAA 75 08/15/2018 1001   GFRAA 70 10/21/2013 1546    BNP    Component Value Date/Time   BNP 3.6 06/30/2016 1039    ProBNP    Component Value Date/Time   PROBNP 18 10/21/2020 0957    Imaging: CT Chest High Resolution  Result Date: 02/08/2021 CLINICAL DATA:  Dyspnea on exertion, history of blood clots EXAM: CT CHEST WITHOUT CONTRAST TECHNIQUE: Multidetector CT imaging of the chest was performed following the standard protocol without intravenous contrast. High resolution imaging of the lungs, as well as inspiratory and expiratory imaging, was performed. COMPARISON:  Outside CT chest angiogram, 12/16/2020 FINDINGS: Cardiovascular: Aortic atherosclerosis. Normal heart size. Extensive 3 vessel coronary artery calcifications and/or stents. No pericardial effusion. Mediastinum/Nodes: No enlarged mediastinal, hilar, or axillary lymph nodes. Thyroid gland, trachea, and esophagus demonstrate no significant findings. Lungs/Pleura: Mild paraseptal emphysema. Bandlike scarring and volume loss of the left lung base with  elevation of the left hemidiaphragm, with minimal scarring and atelectasis of the right lung base. No evidence of fibrotic interstitial lung disease. No significant air trapping on expiratory phase imaging. No pleural effusion or pneumothorax. Upper Abdomen: No acute abnormality.  Adjustable gastric lap band. Musculoskeletal: No chest wall mass or suspicious bone lesions identified. IMPRESSION: 1. No evidence of fibrotic interstitial lung disease. 2. Bandlike scarring and volume loss of the left greater than right lung bases. 3. Mild emphysema. 4. Coronary artery disease. Aortic Atherosclerosis (ICD10-I70.0) and Emphysema (ICD10-J43.9). Electronically Signed   By: Eddie Candle M.D.   On: 02/08/2021 11:43   ECHOCARDIOGRAM COMPLETE  Result Date: 02/03/2021    ECHOCARDIOGRAM REPORT   Patient Name:   CHRSITOPHER CHASEY Date of Exam: 02/03/2021 Medical Rec #:  [id number]        Height:       72.0 in Accession #:    [id number]       Weight:       300.4 lb Date of Birth:  1955/11/02        BSA:          2.533 m Patient Age:    4 years  BP:           126/64 mmHg Patient Gender: M                HR:           74 bpm. Exam Location:  [facility] Procedure: 2D Echo, Cardiac Doppler and Color Doppler Indications:    I50.9* Heart failure (unspecified)  History:        Patient has prior history of Echocardiogram examinations, most                 recent 07/20/2009. CAD; Risk Factors:Hypertension, Diabetes and                 Dyslipidemia. Abdominal aortic aneurysm. Obstructive sleep                 apnea. Obesity. Back pain.  Sonographer:    Diamond Nickel RCS Referring Phys: [id number] [facility] CHANDRASEKHAR IMPRESSIONS  1. Left ventricular ejection fraction, by estimation, is 60 to 65%. The left ventricle has normal function. The left ventricle has no regional wall motion abnormalities. There is moderate left ventricular hypertrophy. Left ventricular diastolic parameters are consistent with Grade I diastolic dysfunction  (impaired relaxation).  2. Right ventricular systolic function is normal. The right ventricular size is normal.  3. The mitral valve is abnormal. Trivial mitral valve regurgitation. Moderate mitral annular calcification.  4. The aortic valve is tricuspid. Aortic valve regurgitation is not visualized. Comparison(s): Prior images unable to be directly viewed, comparison made by report only. Changes from prior study are noted. [id number]: Normal systolic function, mild LVH, impaired relaxation, aortic sclerosis. FINDINGS  Left Ventricle: Left ventricular ejection fraction, by estimation, is 60 to 65%. The left ventricle has normal function. The left ventricle has no regional wall motion abnormalities. The left ventricular internal cavity size was normal in size. There is  moderate left ventricular hypertrophy. Left ventricular diastolic parameters are consistent with Grade I diastolic dysfunction (impaired relaxation). Indeterminate filling pressures. Right Ventricle: The right ventricular size is normal. No increase in right ventricular wall thickness. Right ventricular systolic function is normal. Left Atrium: Left atrial size was normal in size. Right Atrium: Right atrial size was normal in size. Pericardium: There is no evidence of pericardial effusion. Mitral Valve: The mitral valve is abnormal. Moderate mitral annular calcification. Trivial mitral valve regurgitation. Tricuspid Valve: The tricuspid valve is grossly normal. Tricuspid valve regurgitation is trivial. Aortic Valve: The aortic valve is tricuspid. Aortic valve regurgitation is not visualized. Pulmonic Valve: The pulmonic valve was normal in structure. Pulmonic valve regurgitation is not visualized. Aorta: The aortic root and ascending aorta are structurally normal, with no evidence of dilitation. Venous: The inferior vena cava was not well visualized. IAS/Shunts: No atrial level shunt detected by color flow Doppler.  LEFT VENTRICLE PLAX 2D LVIDd:          4.50 cm  Diastology LVIDs:         3.50 cm  LV e' medial:    6.96 cm/s LV PW:         1.40 cm  LV E/e' medial:  11.4 LV IVS:        1.30 cm  LV e' lateral:   7.94 cm/s LVOT diam:     2.30 cm  LV E/e' lateral: 10.0 LV SV:         74 LV SV Index:   29 LVOT Area:     4.15 cm  RIGHT VENTRICLE RV Basal diam:  2.70 cm RV S prime:     13.20 cm/s TAPSE (M-mode): 2.0 cm LEFT ATRIUM             Index       RIGHT ATRIUM           Index LA diam:        3.50 cm 1.38 cm/m  RA Area:     12.90 cm LA Vol (A2C):   79.0 ml 31.19 ml/m RA Volume:   28.80 ml  11.37 ml/m LA Vol (A4C):   46.4 ml 18.32 ml/m LA Biplane Vol: 61.8 ml 24.40 ml/m  AORTIC VALVE LVOT Vmax:   91.10 cm/s LVOT Vmean:  55.600 cm/s LVOT VTI:    0.177 m  AORTA Ao Root diam: 3.50 cm MITRAL VALVE MV Area (PHT): 2.90 cm     SHUNTS MV Decel Time: 262 msec     Systemic VTI:  0.18 m MV E velocity: 79.60 cm/s   Systemic Diam: 2.30 cm MV A velocity: 130.00 cm/s MV E/A ratio:  0.61 Lyman Bishop MD Electronically signed by Lyman Bishop MD Signature Date/Time: 02/03/2021/4:13:56 PM    Final      Assessment & Plan:   Paraseptal emphysema [facility]) - Patient has been experiencing dyspnea with exertion since late last year after getting covid-19. Pulmonary function testing showed moderate restrictive defect (FEV1 75%) with positive bronchodilator reseponse. FENO was normal, 19. Imaging showed no evidence of ILD, scarring and volume loss at lung bases along with mild emphysema. Recommend trial low dose ICS/LABA.   History of pulmonary embolus (PE) - D-dimer was normal, follow up with hematology for anticoagulation duration   OSA (obstructive sleep apnea) - Continue CPAP nightly, awaiting ONO results   DOE (dyspnea on exertion) - Possible asthmatic component along with mild emphysema. CAD and diastolic dysfunction likely also contributing to dyspnea symptoms. He is following with cardiology. We have put him on trial Advair HFA 129mg and will follow-up virtually in  4 weeks   EMartyn Ehrich NP 02/15/2021

## 2021-02-15 NOTE — Progress Notes (Addendum)
ZIQUAN, MUSCH ([id number]) Visit Report for 02/15/2021 Arrival Information Details Patient Name: Date of Service: [place], [name] 02/15/2021 9:30 A M Medical Record Number: [id number] Patient Account Number: [id number] Date of Birth/Sex: Treating RN: Jan 07, 1956 (65 y.o. [name] Primary Care Jennessa Trigo: [name] Other Clinician: Referring Thetis Schwimmer: Treating Tanekia Ryans/Extender: [name] in Treatment: 6 Visit Information History Since Last Visit All ordered tests and consults were Yes Patient Arrived: Cane completed: Arrival Time: 09:37 Added or deleted any medications: Yes Accompanied By: self Any new allergies or adverse reactions: No Transfer Assistance: None Had a fall or experienced change in No Patient Identification Verified: Yes activities of daily living that may affect Secondary Verification Process Completed: Yes risk of falls: Patient Requires Transmission-Based Precautions: No Signs or symptoms of abuse/neglect since No Patient Has Alerts: Yes last visito Patient Alerts: Patient on Blood Thinner Hospitalized since last visit: No ABI's R=1.22 L=1.29 Implantable device outside of the clinic No excluding cellular tissue based products placed in the center since last visit: Has Dressing in Place as Prescribed: Yes Has Footwear/Offloading in Place as Yes Prescribed: Right: Surgical Shoe with Pressure Relief Insole Pain Present Now: No Notes Dr. Earleen [facility] seen on Monday oral antibiotics and culture taken. Electronic Signature(s) Signed: 02/15/2021 6:06:32 PM By: Deon Pilling Entered By: Deon Pilling on 02/15/2021 09:41:40 -------------------------------------------------------------------------------- Clinic Level of Care Assessment Details Patient Name: Date of Service: [name], [name] 02/15/2021 9:30 A M Medical Record Number: [id number] Patient Account Number: [id number] Date of  Birth/Sex: Treating RN: March 28, 1956 (65 y.o. [name], [name] Primary Care Jillian Pianka: [name] Other Clinician: Referring Dewey Viens: Treating Rhiley Solem/Extender: [name] in Treatment: 6 Clinic Level of Care Assessment Items TOOL 4 Quantity Score X- 1 0 Use when only an EandM is performed on FOLLOW-UP visit ASSESSMENTS - Nursing Assessment / Reassessment X- 1 10 Reassessment of Co-morbidities (includes updates in patient status) X- 1 5 Reassessment of Adherence to Treatment Plan ASSESSMENTS - Wound and Skin Assessment / Reassessment X- 1 5 Simple Wound Assessment / Reassessment - one wound '[]'$  - 0 Complex Wound Assessment / Reassessment - multiple wounds X- 1 10 Dermatologic / Skin Assessment (not related to wound area) ASSESSMENTS - Focused Assessment X- 1 5 Circumferential Edema Measurements - multi extremities X- 1 10 Nutritional Assessment / Counseling / Intervention X- 1 5 Lower Extremity Assessment (monofilament, tuning fork, pulses) '[]'$  - 0 Peripheral Arterial Disease Assessment (using hand held doppler) ASSESSMENTS - Ostomy and/or Continence Assessment and Care '[]'$  - 0 Incontinence Assessment and Management '[]'$  - 0 Ostomy Care Assessment and Management (repouching, etc.) PROCESS - Coordination of Care X - Simple Patient / Family Education for ongoing care 1 15 '[]'$  - 0 Complex (extensive) Patient / Family Education for ongoing care X- 1 10 Staff obtains Programmer, systems, Records, T Results / Process Orders est '[]'$  - 0 Staff telephones HHA, Nursing Homes / Clarify orders / etc '[]'$  - 0 Routine Transfer to another Facility (non-emergent condition) '[]'$  - 0 Routine Hospital Admission (non-emergent condition) '[]'$  - 0 New Admissions / Biomedical engineer / Ordering NPWT Apligraf, etc. , '[]'$  - 0 Emergency Hospital Admission (emergent condition) X- 1 10 Simple Discharge Coordination '[]'$  - 0 Complex (extensive) Discharge Coordination PROCESS  - Special Needs '[]'$  - 0 Pediatric / Minor Patient Management '[]'$  - 0 Isolation Patient Management '[]'$  - 0 Hearing / Language / Visual special needs '[]'$  -  0 Assessment of Community assistance (transportation, D/C planning, etc.) '[]'$  - 0 Additional assistance / Altered mentation '[]'$  - 0 Support Surface(s) Assessment (bed, cushion, seat, etc.) INTERVENTIONS - Wound Cleansing / Measurement X - Simple Wound Cleansing - one wound 1 5 '[]'$  - 0 Complex Wound Cleansing - multiple wounds X- 1 5 Wound Imaging (photographs - any number of wounds) '[]'$  - 0 Wound Tracing (instead of photographs) X- 1 5 Simple Wound Measurement - one wound '[]'$  - 0 Complex Wound Measurement - multiple wounds INTERVENTIONS - Wound Dressings X - Small Wound Dressing one or multiple wounds 1 10 '[]'$  - 0 Medium Wound Dressing one or multiple wounds '[]'$  - 0 Large Wound Dressing one or multiple wounds '[]'$  - 0 Application of Medications - topical '[]'$  - 0 Application of Medications - injection INTERVENTIONS - Miscellaneous '[]'$  - 0 External ear exam '[]'$  - 0 Specimen Collection (cultures, biopsies, blood, body fluids, etc.) '[]'$  - 0 Specimen(s) / Culture(s) sent or taken to Lab for analysis '[]'$  - 0 Patient Transfer (multiple staff / Civil Service fast streamer / Similar devices) '[]'$  - 0 Simple Staple / Suture removal (25 or less) '[]'$  - 0 Complex Staple / Suture removal (26 or more) '[]'$  - 0 Hypo / Hyperglycemic Management (close monitor of Blood Glucose) '[]'$  - 0 Ankle / Brachial Index (ABI) - do not check if billed separately X- 1 5 Vital Signs Has the patient been seen at the hospital within the last three years: Yes Total Score: 115 Level Of Care: New/Established - Level 3 Electronic Signature(s) Signed: 02/15/2021 6:06:32 PM By: Deon Pilling Entered By: Deon Pilling on 02/15/2021 10:31:49 -------------------------------------------------------------------------------- Encounter Discharge Information Details Patient Name: Date of  Service: [name], [name] 02/15/2021 9:30 A M Medical Record Number: [id number] Patient Account Number: [id number] Date of Birth/Sex: Treating RN: [birth year] (65 y.o. [name] Primary Care Venicia Vandall: [name] Other Clinician: Referring Kenyatte Gruber: Treating Zurii Hewes/Extender: [name] in Treatment: 6 Encounter Discharge Information Items Discharge Condition: Stable Ambulatory Status: Cane Discharge Destination: Home Transportation: Private Auto Accompanied By: wife Schedule Follow-up Appointment: Yes Clinical Summary of Care: Electronic Signature(s) Signed: 02/15/2021 6:06:32 PM By: Deon Pilling Entered By: Deon Pilling on 02/15/2021 10:33:33 -------------------------------------------------------------------------------- Lower Extremity Assessment Details Patient Name: Date of Service: [name], [place] S. 02/15/2021 9:30 A M Medical Record Number: [id number] Patient Account Number: [id number] Date of Birth/Sex: Treating RN: 1955/11/28 (65 y.o. [name] Primary Care Loyde Orth: [name] Other Clinician: Referring Yariana Hoaglund: Treating Keahi Mccarney/Extender: [name] in Treatment: 6 Edema Assessment Assessed: Shirlyn Goltz: No] [Right: Yes] Edema: [Left: Ye] [Right: s] Calf Left: Right: Point of Measurement: From Medial Instep 46 cm Ankle Left: Right: Point of Measurement: From Medial Instep 27 cm Vascular Assessment Pulses: Dorsalis Pedis Palpable: [Right:Yes] Electronic Signature(s) Signed: 02/15/2021 6:06:32 PM By: Deon Pilling Entered By: Deon Pilling on 02/15/2021 09:45:17 -------------------------------------------------------------------------------- Multi-Disciplinary Care Plan Details Patient Name: Date of Service: [name], [name] 02/15/2021 9:30 A M Medical Record Number: [id number] Patient Account Number: [id number] Date of Birth/Sex: Treating RN: February 05, 1956 (65 y.o. [name] Primary Care Jiali Linney: [name] Other Clinician: Referring Hinton Luellen: Treating Everline Mahaffy/Extender: [name] in Treatment: 6 Gamaliel reviewed with physician Active Inactive Electronic Signature(s) Signed: 03/27/2021 4:44:35 PM By: Deon Pilling RN, BSN Previous Signature: 02/15/2021 6:06:32 PM Version By: Deon Pilling Entered By: Deon Pilling on 03/27/2021 16:44:35 --------------------------------------------------------------------------------  Pain Assessment Details Patient Name: Date of Service: [name], [name] 02/15/2021 9:30 A M Medical Record Number: [id number] Patient Account Number: [id number] Date of Birth/Sex: Treating RN: [birth year] (65 y.o. [name] Primary Care Aarsh Fristoe: [name] Other Clinician: Referring Davita Sublett: Treating Biviana Saddler/Extender: [name] in Treatment: 6 Active Problems Location of Pain Severity and Description of Pain Patient Has Paino No Site Locations Duration of the Pain. Duration of the Pain. Constant / Intermittento Intermittent Rate the pain. Current Pain Level: 0 Worst Pain Level: 8 Least Pain Level: 0 Tolerable Pain Level: 7 Character of Pain Describe the Pain: Heavy, Sharp Pain Management and Medication Current Pain Management: Medication: No Cold Application: No Rest: No Massage: No Activity: No T.E.N.S.: No Heat Application: No Leg drop or elevation: No Is the Current Pain Management Adequate: Adequate How does your wound impact your activities of daily livingo Sleep: No Bathing: No Appetite: No Relationship With Others: No Bladder Continence: No Emotions: No Bowel Continence: No Work: No Toileting: No Drive: No Dressing: No Hobbies: No Electronic Signature(s) Signed: 02/15/2021 6:06:32 PM By: Deon Pilling Entered By: Deon Pilling on 02/15/2021  09:42:52 -------------------------------------------------------------------------------- Patient/Caregiver Education Details Patient Name: Date of Service: [name], MA Rich Number 8/31/2022andnbsp9:30 A M Medical Record Number: [id number] Patient Account Number: [id number] Date of Birth/Gender: Treating RN: 1955/06/20 (65 y.o. [name] Primary Care Physician: [name] Other Clinician: Referring Physician: Treating Physician/Extender: [name] in Treatment: 6 Education Assessment Education Provided To: Patient Education Topics Provided Elevated Blood Sugar/ Impact on Healing: Handouts: Elevated Blood Sugars: How Do They Affect Wound Healing Methods: Explain/Verbal Responses: Reinforcements needed Electronic Signature(s) Signed: 02/15/2021 6:06:32 PM By: Deon Pilling Signed: 02/15/2021 6:06:32 PM By: Deon Pilling Entered By: Deon Pilling on 02/15/2021 09:57:20 -------------------------------------------------------------------------------- Wound Assessment Details Patient Name: Date of Service: [name], [name] 02/15/2021 9:30 A M Medical Record Number: [id number] Patient Account Number: [id number] Date of Birth/Sex: Treating RN: [birth year] (65 y.o. [name], [name] Primary Care Nehal Shives: [name] Other Clinician: Referring Rajah Lamba: Treating Latoria Dry/Extender: [name] in Treatment: 6 Wound Status Wound Number: 1 Primary Diabetic Wound/Ulcer of the Lower Extremity Etiology: Wound Location: Right, Plantar T Great oe Wound Open Wounding Event: Other Lesion Status: Date Acquired: 06/27/2020 Comorbid Sleep Apnea, Coronary Artery Disease, Hypertension, Type II Weeks Of Treatment: 6 History: Diabetes, Osteoarthritis, Neuropathy Clustered Wound: No Photos Wound Measurements Length: (cm) 0.4 Width: (cm) 0.3 Depth: (cm) 0.3 Area: (cm) 0.094 Volume: (cm) 0.028 % Reduction in Area:  40.1% % Reduction in Volume: 64.6% Epithelialization: Small (1-33%) Tunneling: No Undermining: No Wound Description Classification: Grade 1 Wound Margin: Well defined, not attached Exudate Amount: Medium Exudate Type: Sanguinous Exudate Color: red Foul Odor After Cleansing: No Slough/Fibrino No Wound Bed Granulation Amount: Large (67-100%) Exposed Structure Granulation Quality: Red Fascia Exposed: No Necrotic Amount: None Present (0%) Fat Layer (Subcutaneous Tissue) Exposed: Yes Tendon Exposed: No Muscle Exposed: No Joint Exposed: No Bone Exposed: No Assessment Notes patient refuses to measure depth and undermining. right foot edema, redness, hot to touch noted only from base of right great toe to dorsal foot. No redness noted to periwound or right great toe. Electronic Signature(s) Signed: 02/15/2021 6:06:32 PM By: Deon Pilling Entered By: Deon Pilling on 02/15/2021 10:15:23 -------------------------------------------------------------------------------- Vitals Details Patient Name: Date of Service: [name], [name] 02/15/2021 9:30 A M Medical Record Number: [id number]  Patient Account Number: [id number] Date of Birth/Sex: Treating RN: Feb 11, 1956 (65 y.o. [name] Primary Care Taren Toops: [name] Other Clinician: Referring Benn Tarver: Treating Jade Burright/Extender: [name] in Treatment: 6 Vital Signs Time Taken: 09:40 Temperature (F): 98.1 Pulse (bpm): 73 Respiratory Rate (breaths/min): 20 Blood Pressure (mmHg): 127/78 Reference Range: 80 - 120 mg / dl Electronic Signature(s) Signed: 02/15/2021 6:06:32 PM By: Deon Pilling Entered By: Deon Pilling on 02/15/2021 09:42:33

## 2021-02-15 NOTE — Assessment & Plan Note (Addendum)
-   Patient has been experiencing dyspnea with exertion since late last year after getting covid-19. Pulmonary function testing showed moderate restrictive defect (FEV1 75%) with positive bronchodilator reseponse. FENO was normal, 19. Imaging showed no evidence of ILD, scarring and volume loss at lung bases along with mild emphysema. Recommend trial low dose ICS/LABA.

## 2021-02-16 ENCOUNTER — Encounter: Payer: Self-pay | Admitting: Podiatry

## 2021-02-16 ENCOUNTER — Inpatient Hospital Stay: Payer: Medicare Other

## 2021-02-16 ENCOUNTER — Other Ambulatory Visit: Payer: Self-pay | Admitting: Podiatry

## 2021-02-16 ENCOUNTER — Inpatient Hospital Stay: Payer: Medicare Other | Attending: Hematology and Oncology | Admitting: Hematology and Oncology

## 2021-02-16 VITALS — BP 128/72 | HR 71 | Temp 98.2°F | Resp 18 | Ht 72.0 in | Wt 304.9 lb

## 2021-02-16 DIAGNOSIS — I2699 Other pulmonary embolism without acute cor pulmonale: Secondary | ICD-10-CM | POA: Diagnosis present

## 2021-02-16 DIAGNOSIS — I509 Heart failure, unspecified: Secondary | ICD-10-CM | POA: Diagnosis not present

## 2021-02-16 DIAGNOSIS — Z86711 Personal history of pulmonary embolism: Secondary | ICD-10-CM

## 2021-02-16 DIAGNOSIS — K760 Fatty (change of) liver, not elsewhere classified: Secondary | ICD-10-CM | POA: Diagnosis not present

## 2021-02-16 DIAGNOSIS — K219 Gastro-esophageal reflux disease without esophagitis: Secondary | ICD-10-CM | POA: Insufficient documentation

## 2021-02-16 DIAGNOSIS — I110 Hypertensive heart disease with heart failure: Secondary | ICD-10-CM | POA: Insufficient documentation

## 2021-02-16 DIAGNOSIS — G4733 Obstructive sleep apnea (adult) (pediatric): Secondary | ICD-10-CM | POA: Diagnosis not present

## 2021-02-16 DIAGNOSIS — E119 Type 2 diabetes mellitus without complications: Secondary | ICD-10-CM | POA: Diagnosis not present

## 2021-02-16 DIAGNOSIS — E785 Hyperlipidemia, unspecified: Secondary | ICD-10-CM | POA: Insufficient documentation

## 2021-02-16 DIAGNOSIS — Z7901 Long term (current) use of anticoagulants: Secondary | ICD-10-CM | POA: Insufficient documentation

## 2021-02-16 DIAGNOSIS — I2782 Chronic pulmonary embolism: Secondary | ICD-10-CM

## 2021-02-16 LAB — CMP (CANCER CENTER ONLY)
ALT: 26 U/L (ref 0–44)
AST: 27 U/L (ref 15–41)
Albumin: 3.8 g/dL (ref 3.5–5.0)
Alkaline Phosphatase: 62 U/L (ref 38–126)
Anion gap: 11 (ref 5–15)
BUN: 11 mg/dL (ref 8–23)
CO2: 26 mmol/L (ref 22–32)
Calcium: 8.9 mg/dL (ref 8.9–10.3)
Chloride: 104 mmol/L (ref 98–111)
Creatinine: 1.28 mg/dL — ABNORMAL HIGH (ref 0.61–1.24)
GFR, Estimated: >60 mL/min (ref >60)
Glucose, Bld: 163 mg/dL — ABNORMAL HIGH (ref 70–99)
Potassium: 4.1 mmol/L (ref 3.5–5.1)
Sodium: 141 mmol/L (ref 135–145)
Total Bilirubin: 0.4 mg/dL (ref 0.3–1.2)
Total Protein: 6.8 g/dL (ref 6.5–8.1)

## 2021-02-16 LAB — CBC WITH DIFFERENTIAL (CANCER CENTER ONLY)
Abs Immature Granulocytes: 0.01 10*3/uL (ref 0.00–0.07)
Basophils Absolute: 0 10*3/uL (ref 0.0–0.1)
Basophils Relative: 1 %
Eosinophils Absolute: 0.1 10*3/uL (ref 0.0–0.5)
Eosinophils Relative: 2 %
HCT: 43.1 % (ref 39.0–52.0)
Hemoglobin: 14.8 g/dL (ref 13.0–17.0)
Immature Granulocytes: 0 %
Lymphocytes Relative: 34 %
Lymphs Abs: 1.7 10*3/uL (ref 0.7–4.0)
MCH: 32.5 pg (ref 26.0–34.0)
MCHC: 34.3 g/dL (ref 30.0–36.0)
MCV: 94.7 fL (ref 80.0–100.0)
Monocytes Absolute: 0.5 10*3/uL (ref 0.1–1.0)
Monocytes Relative: 9 %
Neutro Abs: 2.7 10*3/uL (ref 1.7–7.7)
Neutrophils Relative %: 54 %
Platelet Count: 192 10*3/uL (ref 150–400)
RBC: 4.55 MIL/uL (ref 4.22–5.81)
RDW: 12.5 % (ref 11.5–15.5)
WBC Count: 5.1 10*3/uL (ref 4.0–10.5)
nRBC: 0 % (ref 0.0–0.2)

## 2021-02-16 LAB — WOUND CULTURE
MICRO NUMBER:: 12304180
SPECIMEN QUALITY:: ADEQUATE

## 2021-02-16 MED ORDER — LINEZOLID 600 MG PO TABS: 600.0000 mg | ORAL_TABLET | Freq: Two times a day (BID) | ORAL | 0 refills | Status: DC

## 2021-02-16 NOTE — Progress Notes (Signed)
AC, GLADE ([id number]) Visit Report for 02/10/2021 Arrival Information Details Patient Name: Date of Service: [place], [name] 02/10/2021 11:00 A M Medical Record Number: [id number] Patient Account Number: [id number] Date of Birth/Sex: Treating RN: September 24, 1955 (65 y.o. Burnadette Pop, Lauren Primary Care Kaeleigh Westendorf: [name] Other Clinician: Referring Sofhia Ulibarri: Treating Jaime Grizzell/Extender: Cheri Guppy in Treatment: 5 Visit Information History Since Last Visit Added or deleted any medications: No Patient Arrived: [name] Any new allergies or adverse reactions: No Arrival Time: 11:02 Had a fall or experienced change in No Accompanied By: daughter activities of daily living that may affect Transfer Assistance: None risk of falls: Patient Identification Verified: Yes Signs or symptoms of abuse/neglect since last visito No Secondary Verification Process Completed: Yes Hospitalized since last visit: No Patient Requires Transmission-Based Precautions: No Implantable device outside of the clinic excluding No Patient Has Alerts: Yes cellular tissue based products placed in the center Patient Alerts: Patient on Blood Thinner since last visit: ABI's R=1.22 L=1.29 Has Dressing in Place as Prescribed: Yes Pain Present Now: No Electronic Signature(s) Signed: 02/14/2021 5:24:19 PM By: Rhae Hammock RN Entered By: Rhae Hammock on 02/10/2021 11:06:28 -------------------------------------------------------------------------------- Clinic Level of Care Assessment Details Patient Name: Date of Service: [name], [place] S. 02/10/2021 11:00 A M Medical Record Number: [id number] Patient Account Number: [id number] Date of Birth/Sex: Treating RN: [birth year] (65 y.o. [name] Primary Care Elyse Prevo: [name] Other Clinician: Referring Nickolai Rinks: Treating Giavanni Odonovan/Extender: Cheri Guppy in Treatment: 5 Clinic  Level of Care Assessment Items TOOL 4 Quantity Score '[]'$  - 0 Use when only an EandM is performed on FOLLOW-UP visit ASSESSMENTS - Nursing Assessment / Reassessment X- 1 10 Reassessment of Co-morbidities (includes updates in patient status) X- 1 5 Reassessment of Adherence to Treatment Plan ASSESSMENTS - Wound and Skin A ssessment / Reassessment X - Simple Wound Assessment / Reassessment - one wound 1 5 '[]'$  - 0 Complex Wound Assessment / Reassessment - multiple wounds '[]'$  - 0 Dermatologic / Skin Assessment (not related to wound area) ASSESSMENTS - Focused Assessment '[]'$  - 0 Circumferential Edema Measurements - multi extremities '[]'$  - 0 Nutritional Assessment / Counseling / Intervention X- 1 5 Lower Extremity Assessment (monofilament, tuning fork, pulses) '[]'$  - 0 Peripheral Arterial Disease Assessment (using hand held doppler) ASSESSMENTS - Ostomy and/or Continence Assessment and Care '[]'$  - 0 Incontinence Assessment and Management '[]'$  - 0 Ostomy Care Assessment and Management (repouching, etc.) PROCESS - Coordination of Care X - Simple Patient / Family Education for ongoing care 1 15 '[]'$  - 0 Complex (extensive) Patient / Family Education for ongoing care X- 1 10 Staff obtains Programmer, systems, Records, T Results / Process Orders est '[]'$  - 0 Staff telephones HHA, Nursing Homes / Clarify orders / etc '[]'$  - 0 Routine Transfer to another Facility (non-emergent condition) '[]'$  - 0 Routine Hospital Admission (non-emergent condition) '[]'$  - 0 New Admissions / Biomedical engineer / Ordering NPWT Apligraf, etc. , '[]'$  - 0 Emergency Hospital Admission (emergent condition) X- 1 10 Simple Discharge Coordination '[]'$  - 0 Complex (extensive) Discharge Coordination PROCESS - Special Needs '[]'$  - 0 Pediatric / Minor Patient Management '[]'$  - 0 Isolation Patient Management '[]'$  - 0 Hearing / Language / Visual special needs '[]'$  - 0 Assessment of Community assistance (transportation, D/C planning, etc.) '[]'$   - 0 Additional assistance / Altered mentation '[]'$  - 0 Support Surface(s) Assessment (bed, cushion, seat, etc.) INTERVENTIONS - Wound Cleansing / Measurement X -  Simple Wound Cleansing - one wound 1 5 '[]'$  - 0 Complex Wound Cleansing - multiple wounds X- 1 5 Wound Imaging (photographs - any number of wounds) '[]'$  - 0 Wound Tracing (instead of photographs) X- 1 5 Simple Wound Measurement - one wound '[]'$  - 0 Complex Wound Measurement - multiple wounds INTERVENTIONS - Wound Dressings X - Small Wound Dressing one or multiple wounds 1 10 '[]'$  - 0 Medium Wound Dressing one or multiple wounds '[]'$  - 0 Large Wound Dressing one or multiple wounds X- 1 5 Application of Medications - topical '[]'$  - 0 Application of Medications - injection INTERVENTIONS - Miscellaneous '[]'$  - 0 External ear exam '[]'$  - 0 Specimen Collection (cultures, biopsies, blood, body fluids, etc.) '[]'$  - 0 Specimen(s) / Culture(s) sent or taken to Lab for analysis '[]'$  - 0 Patient Transfer (multiple staff / Civil Service fast streamer / Similar devices) '[]'$  - 0 Simple Staple / Suture removal (25 or less) '[]'$  - 0 Complex Staple / Suture removal (26 or more) '[]'$  - 0 Hypo / Hyperglycemic Management (close monitor of Blood Glucose) '[]'$  - 0 Ankle / Brachial Index (ABI) - do not check if billed separately X- 1 5 Vital Signs Has the patient been seen at the hospital within the last three years: Yes Total Score: 95 Level Of Care: New/Established - Level 3 Electronic Signature(s) Signed: 02/10/2021 1:08:00 PM By: Baruch Gouty RN, BSN Entered By: Baruch Gouty on 02/10/2021 11:55:02 -------------------------------------------------------------------------------- Lower Extremity Assessment Details Patient Name: Date of Service: [name], [place] S. 02/10/2021 11:00 A M Medical Record Number: [id number] Patient Account Number: [id number] Date of Birth/Sex: Treating RN: 07/25/1955 (65 y.o. Burnadette Pop, Lauren Primary Care Koleen Celia: [name]  Other Clinician: Referring Geniva Lohnes: Treating Egon Dittus/Extender: Cheri Guppy in Treatment: 5 Edema Assessment Assessed: Shirlyn Goltz: No] Patrice Paradise: Yes] Edema: [Left: Ye] [Right: s] Calf Left: Right: Point of Measurement: From Medial Instep 45.3 cm Ankle Left: Right: Point of Measurement: From Medial Instep 29.1 cm Vascular Assessment Pulses: Dorsalis Pedis Palpable: [Right:Yes] Posterior Tibial Palpable: [Right:Yes] Electronic Signature(s) Signed: 02/14/2021 5:24:19 PM By: Rhae Hammock RN Entered By: Rhae Hammock on 02/10/2021 11:08:44 -------------------------------------------------------------------------------- Multi Wound Chart Details Patient Name: Date of Service: [name], [name] 02/10/2021 11:00 A M Medical Record Number: [id number] Patient Account Number: [id number] Date of Birth/Sex: Treating RN: [birth year] (65 y.o. [name] Primary Care Beauford Lando: [name] Other Clinician: Referring Tarek Cravens: Treating Felicidad Sugarman/Extender: Cheri Guppy in Treatment: 5 Vital Signs Height(in): Capillary Blood Glucose(mg/dl): 135 Weight(lbs): Pulse(bpm): 29 Body Mass Index(BMI): Blood Pressure(mmHg): 106/71 Temperature(F): 98.47 Respiratory Rate(breaths/min): 17 Photos: [N/A:N/A] Right, Plantar T Great oe N/A N/A Wound Location: Other Lesion N/A N/A Wounding Event: Diabetic Wound/Ulcer of the Lower N/A N/A Primary Etiology: Extremity Sleep Apnea, Coronary Artery N/A N/A Comorbid History: Disease, Hypertension, Type II Diabetes, Osteoarthritis, Neuropathy 06/27/2020 N/A N/A Date Acquired: 5 N/A N/A Weeks of Treatment: Open N/A N/A Wound Status: 0.5x0.4x0.5 N/A N/A Measurements L x W x D (cm) 0.157 N/A N/A A (cm) : rea 0.079 N/A N/A Volume (cm) : 0.00% N/A N/A % Reduction in A rea: 0.00% N/A N/A % Reduction in Volume: 12 Starting Position 1 (o'clock): 12 Ending Position 1  (o'clock): 0.6 Maximum Distance 1 (cm): Yes N/A N/A Undermining: Grade 1 N/A N/A Classification: Medium N/A N/A Exudate A mount: Sanguinous N/A N/A Exudate Type: red N/A N/A Exudate Color: Well defined, not attached N/A N/A Wound Margin: Large (67-100%) N/A N/A  Granulation A mount: Red N/A N/A Granulation Quality: None Present (0%) N/A N/A Necrotic A mount: Fat Layer (Subcutaneous Tissue): Yes N/A N/A Exposed Structures: Fascia: No Tendon: No Muscle: No Joint: No Bone: No Small (1-33%) N/A N/A Epithelialization: Treatment Notes Electronic Signature(s) Signed: 02/10/2021 12:33:14 PM By: Kalman Shan DO Signed: 02/10/2021 1:08:00 PM By: Baruch Gouty RN, BSN Entered By: Kalman Shan on 02/10/2021 12:25:50 -------------------------------------------------------------------------------- Multi-Disciplinary Care Plan Details Patient Name: Date of Service: [name], [name] 02/10/2021 11:00 A M Medical Record Number: [id number] Patient Account Number: [id number] Date of Birth/Sex: Treating RN: 1956/05/23 (65 y.o. [name] Primary Care Kennie Karapetian: [name] Other Clinician: Referring Jasin Brazel: Treating Jori Thrall/Extender: Cheri Guppy in Treatment: 5 Multidisciplinary Care Plan reviewed with physician Active Inactive Nutrition Nursing Diagnoses: Impaired glucose control: actual or potential Potential for alteratiion in Nutrition/Potential for imbalanced nutrition Goals: Patient/caregiver will maintain therapeutic glucose control Date Initiated: 01/04/2021 Target Resolution Date: 03/01/2021 Goal Status: Active Interventions: Assess HgA1c results as ordered upon admission and as needed Assess patient nutrition upon admission and as needed per policy Provide education on elevated blood sugars and impact on wound healing Treatment Activities: Patient referred to Primary Care Physician for further nutritional  evaluation : 01/04/2021 Notes: Wound/Skin Impairment Nursing Diagnoses: Impaired tissue integrity Knowledge deficit related to ulceration/compromised skin integrity Goals: Patient/caregiver will verbalize understanding of skin care regimen Date Initiated: 01/04/2021 Target Resolution Date: 03/01/2021 Goal Status: Active Ulcer/skin breakdown will have a volume reduction of 30% by week 4 Date Initiated: 01/04/2021 Date Inactivated: 02/08/2021 Target Resolution Date: 02/01/2021 Unmet Reason: offloading Goal Status: Unmet noncompliance Ulcer/skin breakdown will have a volume reduction of 50% by week 8 Date Initiated: 02/08/2021 Target Resolution Date: 03/01/2021 Goal Status: Active Interventions: Assess patient/caregiver ability to obtain necessary supplies Assess patient/caregiver ability to perform ulcer/skin care regimen upon admission and as needed Assess ulceration(s) every visit Provide education on ulcer and skin care Treatment Activities: Skin care regimen initiated : 01/04/2021 Topical wound management initiated : 01/04/2021 Notes: Electronic Signature(s) Signed: 02/10/2021 1:08:00 PM By: Baruch Gouty RN, BSN Entered By: Baruch Gouty on 02/10/2021 11:33:27 -------------------------------------------------------------------------------- Pain Assessment Details Patient Name: Date of Service: [name], [name] 02/10/2021 11:00 A M Medical Record Number: [id number] Patient Account Number: [id number] Date of Birth/Sex: Treating RN: [birth year] (65 y.o. Burnadette Pop, Lauren Primary Care Morning Halberg: [name] Other Clinician: Referring Badr Piedra: Treating Shelaine Frie/Extender: Cheri Guppy in Treatment: 5 Active Problems Location of Pain Severity and Description of Pain Patient Has Paino No Site Locations Pain Management and Medication Current Pain Management: Electronic Signature(s) Signed: 02/14/2021 5:24:19 PM By: Rhae Hammock  RN Entered By: Rhae Hammock on 02/10/2021 11:06:56 -------------------------------------------------------------------------------- Patient/Caregiver Education Details Patient Name: Date of Service: [name], Jens Som 8/26/2022andnbsp11:00 A M Medical Record Number: [id number] Patient Account Number: [id number] Date of Birth/Gender: Treating RN: [birth year] (65 y.o. [name] Primary Care Physician: [name] Other Clinician: Referring Physician: Treating Physician/Extender: Cheri Guppy in Treatment: 5 Education Assessment Education Provided To: Patient Education Topics Provided Offloading: Methods: Explain/Verbal Responses: Reinforcements needed, State content correctly Wound/Skin Impairment: Methods: Explain/Verbal Responses: Reinforcements needed, State content correctly Electronic Signature(s) Signed: 02/10/2021 1:08:00 PM By: Baruch Gouty RN, BSN Entered By: Baruch Gouty on 02/10/2021 11:33:54 -------------------------------------------------------------------------------- Wound Assessment Details Patient Name: Date of Service: [name], [name] 02/10/2021 11:00 A M Medical Record Number: [id number] Patient Account Number: [id number] Date of  Birth/Sex: Treating RN: March 02, 1956 (66 y.o. Burnadette Pop, Lauren Primary Care Brookelle Pellicane: [name] Other Clinician: Referring Hydee Fleece: Treating Bree Heinzelman/Extender: Cheri Guppy in Treatment: 5 Wound Status Wound Number: 1 Primary Diabetic Wound/Ulcer of the Lower Extremity Etiology: Wound Location: Right, Plantar T Great oe Wound Open Wounding Event: Other Lesion Status: Date Acquired: 06/27/2020 Comorbid Sleep Apnea, Coronary Artery Disease, Hypertension, Type II Weeks Of Treatment: 5 History: Diabetes, Osteoarthritis, Neuropathy Clustered Wound: No Photos Wound Measurements Length: (cm) 0.5 Width: (cm) 0.4 Depth: (cm)  0.5 Area: (cm) 0.157 Volume: (cm) 0.079 % Reduction in Area: 0% % Reduction in Volume: 0% Epithelialization: Small (1-33%) Tunneling: No Undermining: Yes Starting Position (o'clock): 12 Ending Position (o'clock): 12 Maximum Distance: (cm) 0.6 Wound Description Classification: Grade 1 Wound Margin: Well defined, not attached Exudate Amount: Medium Exudate Type: Sanguinous Exudate Color: red Foul Odor After Cleansing: No Slough/Fibrino No Wound Bed Granulation Amount: Large (67-100%) Exposed Structure Granulation Quality: Red Fascia Exposed: No Necrotic Amount: None Present (0%) Fat Layer (Subcutaneous Tissue) Exposed: Yes Tendon Exposed: No Muscle Exposed: No Joint Exposed: No Bone Exposed: No Electronic Signature(s) Signed: 02/14/2021 5:24:19 PM By: Rhae Hammock RN Signed: 02/16/2021 5:19:32 PM By: Levan Hurst RN, BSN Entered By: Levan Hurst on 02/10/2021 11:23:23 -------------------------------------------------------------------------------- Vitals Details Patient Name: Date of Service: [name], [name] 02/10/2021 11:00 A M Medical Record Number: [id number] Patient Account Number: [id number] Date of Birth/Sex: Treating RN: 1955-08-10 (65 y.o. Burnadette Pop, Lauren Primary Care Walther Sanagustin: [name] Other Clinician: Referring Adeliz Tonkinson: Treating Maiko Salais/Extender: Cheri Guppy in Treatment: 5 Vital Signs Time Taken: 11:06 Temperature (F): 98.47 Pulse (bpm): 74 Respiratory Rate (breaths/min): 17 Blood Pressure (mmHg): 106/71 Capillary Blood Glucose (mg/dl): 135 Reference Range: 80 - 120 mg / dl Electronic Signature(s) Signed: 02/14/2021 5:24:19 PM By: Rhae Hammock RN Entered By: Rhae Hammock on 02/10/2021 11:06:50

## 2021-02-16 NOTE — Progress Notes (Signed)
[place] [phone]   Fax:([phone] [place] NOTE  Patient Care Team: [name], MD as PCP - General (Family Medicine) Werner Lean, MD as PCP - Cardiology (Cardiology) Belva Crome, MD as Consulting Physician (Cardiology) Milus Banister, MD as Attending Physician (Gastroenterology) Brand Males, MD as Consulting Physician (Pulmonary Disease) Druscilla Brownie, MD as Consulting Physician (Dermatology) Mosetta Anis, MD as Referring Physician (Allergy) Rexene Agent, MD as Attending Physician (Nephrology) Thornell Sartorius, MD as Consulting Physician (Otolaryngology) Christain Sacramento, [place] as Referring Physician (Optometry) Ulice Brilliant, MD as Referring Physician (Internal Medicine)  Hematological/Oncological History # Unprovoked RLL Segmental Pulmonary Embolism 12/16/2020: CT Angio chest showed a segmental pulmonary thromboembolism in the right lower lobe. No evidence of right heart strain 02/16/2021: establish care with Dr. Lorenso Courier   CHIEF COMPLAINTS/PURPOSE OF CONSULTATION:  "RLL Segmental Pulmonary Embolism "  HISTORY OF PRESENTING ILLNESS:  [name] 65 y.o. male with medical history significant for GERD, fatty liver disease, DM type II, HLD, OSA and HTN who presents for evaluation of a segmental pulmonary embolism.   On review of the previous records [name] underwent a CT angiogram of his chest on 12/16/2020.  There is an incidental finding of a segmental pulmonary thromboembolism in the right lower lobe with no evidence of right heart strain.  Due to concern for this finding the patient was referred to hematology for further evaluation management.  On exam today [name] reports that he was diagnosed with a right lower lobe pulmonary embolism in July 2022.  He was having marked shortness of breath and believed it to be COVID-related at the time.  When his oxygen saturations were checked he reported  that these were normal.  The patient does note that he travels quite a lot and is frequently in airports.  He developed a sore on his big toe in January 2022 for which he was treated with doxycycline.  He notes that his symptoms started and had worsening around November 2021.  He underwent a routine CT scan assessing an aortic aneurysm and was incidentally found to have this pulmonary embolism.  On further discussion he notes that his family history is remarkable for a mother who passed away after having a blood clot.  He notes that his father had mesothelioma and is oldest brother broke his leg and also had blood clots.  He has 2 sons both of whom are healthy.  He reports that he was a smoker but quit smoking in 1993.  He smoked for 30 years and is worse with smoking 4 packs/day.  He notes that he drinks red wine daily up to 2 to 3 glasses.  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  He notes that he is currently tolerating his Xarelto therapy well and is not having issues with bleeding, bruising, or dark stools.  He notes that when he puts his fingers he does bleed more than he would have prior.  He denies any swelling of his legs for continued chest pain, though shortness of breath does persist.  Full 10 point ROS is listed below.  MEDICAL HISTORY:  Past Medical History:  Diagnosis Date   Abdominal aortic aneurysm (AAA), 30-34 mm diameter ([place]) 06/01/2017   Nml w/ greatest dm 3 cm US Aorta 06/2016, Abd [place] 05/01/2017 showed infrarenal dilatation at 3.4 cm - rec repeat US in 3 yrs.   Arthritis    Cervical disc disease 12/26/2011  Coronary artery disease involving native coronary artery of native heart without angina pectoris 12/01/2016   He had coronary angiography in 2011. There was irregularity within the LAD with up to 30% narrowing. The myocardial perfusion imaging done 11/07/2015 did not demonstrate any evidence of ischemia with a low risk nuclear stress test other than EF estimated at  47%. Chest CT 01/25/2016 showed diffuse coronary artery calcifications with heavy calcifications in the LAD. Pt seen by cardiology Dr. Linard Millers    Diabetes mellitus    Difficult intubation 04/05/2014   Dyslipidemia    Family history of anesthesia complication    pt states took days for his father to awaken after mask was used    Fatty liver 06/01/2017   [place] 05/01/2017   GERD (gastroesophageal reflux disease)    has been having acid reflux since recent endoscopy    H/O hiatal hernia    repair with lap band, now has ventral hernia midline abd   Hearing loss-aides 03/26/2012   Hyperlipidemia    Hypertension    Morbid obesity ([place])    OSA (obstructive sleep apnea)    Rotator cuff tear    Seasonal allergies    Shortness of breath    pt states related to high BP meds with extended walking or climbling stairs   Sleep apnea     SURGICAL HISTORY: Past Surgical History:  Procedure Laterality Date   CARDIAC CATHETERIZATION     approx 3 years ago    COLONOSCOPY WITH PROPOFOL N/A 03/10/2015   Procedure: COLONOSCOPY WITH PROPOFOL;  Surgeon: Milus Banister, MD;  Location: [facility];  Service: Endoscopy;  Laterality: N/A;   colonscopy      2012   ESOPHAGOGASTRODUODENOSCOPY (EGD) WITH PROPOFOL N/A 03/10/2015   Procedure: ESOPHAGOGASTRODUODENOSCOPY (EGD) WITH PROPOFOL;  Surgeon: Milus Banister, MD;  Location: [facility];  Service: Endoscopy;  Laterality: N/A;   HERNIA REPAIR     with gastric banding   LAPAROSCOPIC GASTRIC BANDING N/A 04/05/2014   Procedure: LAPAROSCOPIC GASTRIC BANDING;  Surgeon: Pedro Earls, MD;  Location: [facility];  Service: General;  Laterality: N/A;   MENISCUS REPAIR Left    left knee torn meniscus   VASECTOMY      SOCIAL HISTORY: Social History   Socioeconomic History   Marital status: Married    Spouse name: Not on file   Number of children: 2   Years of education: Not on file   Highest education level: Not on file  Occupational History   Occupation:  owner    Comment: [employer]  Tobacco Use   Smoking status: Former    Packs/day: 4.00    Years: 20.00    Pack years: 80.00    Types: Cigarettes    Start date: 07/19/1973    Quit date: 12/13/1991    Years since quitting: 29.2   Smokeless tobacco: Never  Vaping Use   Vaping Use: Never used  Substance and Sexual Activity   Alcohol use: Yes    Alcohol/week: 3.0 - 5.0 standard drinks    Types: 3 - 5 Glasses of wine per week    Comment: takes 3 to 5 glasses of wine nightly    Drug use: No   Sexual activity: Not on file  Other Topics Concern   Not on file  Social History Narrative   Not on file   Social Determinants of Health   Financial Resource Strain: Not on file  Food Insecurity: Not on file  Transportation Needs: Not on  file  Physical Activity: Not on file  Stress: Not on file  Social Connections: Not on file  Intimate Partner Violence: Not on file    FAMILY HISTORY: Family History  Problem Relation Age of Onset   Clotting disorder Mother        lung clot   Mesothelioma Father    Hypertension Brother    Skin cancer Brother    Colon cancer Neg Hx     ALLERGIES:  is allergic to farxiga [dapagliflozin], erythromycin, penicillins, and quercus robur.  MEDICATIONS:  Current Outpatient Medications  Medication Sig Dispense Refill   diltiazem (TIAZAC) 300 MG 24 hr capsule Take 300 mg by mouth daily.     doxycycline (VIBRA-TABS) 100 MG tablet Take 1 tablet (100 mg total) by mouth 2 (two) times daily. 20 tablet 0   fluticasone (FLONASE) 50 MCG/ACT nasal spray Place 2 sprays into both nostrils daily. 16 g 2   fluticasone-salmeterol (ADVAIR HFA) 115-21 MCG/ACT inhaler Inhale 2 puffs into the lungs 2 (two) times daily. 1 each 3   furosemide (LASIX) 40 MG tablet Take 1 tablet (40 mg total) by mouth 2 (two) times daily. (Patient taking differently: Take 80 mg by mouth daily.) 180 tablet 3   glucose blood (TRUETEST TEST) test strip Use as instructed 100 each 2   insulin  degludec (TRESIBA FLEXTOUCH) 200 UNIT/ML FlexTouch Pen Inject 104 Units into the skin daily.     Insulin Pen Needle (B-D UF III MINI PEN NEEDLES) 31G X 5 MM MISC USE AS DIRECTED ONCE A DAY 100 each 11   linezolid (ZYVOX) 600 MG tablet Take 1 tablet (600 mg total) by mouth 2 (two) times daily. 20 tablet 0   lisinopril (ZESTRIL) 40 MG tablet daily.     Multiple Vitamin (MULTIVITAMIN) tablet Take 1 tablet by mouth every morning.      NEOMYCIN-POLYMYXIN-HYDROCORTISONE (CORTISPORIN) 1 % SOLN OTIC solution as needed.     pantoprazole (PROTONIX) 40 MG tablet Take 1 tablet (40 mg total) by mouth daily. 30 tablet 0   Potassium Chloride ER 20 MEQ TBCR Take 40 mEq by mouth daily.     rosuvastatin (CRESTOR) 20 MG tablet Take 1 tablet (20 mg total) by mouth daily. 90 tablet 1   tamsulosin (FLOMAX) 0.4 MG CAPS capsule Take 1 capsule (0.4 mg total) by mouth daily after supper. 90 capsule 0   XARELTO 20 MG TABS tablet Take 20 mg by mouth daily.     No current facility-administered medications for this visit.    REVIEW OF SYSTEMS:   Constitutional: ( - ) fevers, ( - )  chills , ( - ) night sweats Eyes: ( - ) blurriness of vision, ( - ) double vision, ( - ) watery eyes Ears, nose, mouth, throat, and face: ( - ) mucositis, ( - ) sore throat Respiratory: ( - ) cough, ( - ) dyspnea, ( - ) wheezes Cardiovascular: ( - ) palpitation, ( - ) chest discomfort, ( - ) lower extremity swelling Gastrointestinal:  ( - ) nausea, ( - ) heartburn, ( - ) change in bowel habits Skin: ( - ) abnormal skin rashes Lymphatics: ( - ) new lymphadenopathy, ( - ) easy bruising Neurological: ( - ) numbness, ( - ) tingling, ( - ) new weaknesses Behavioral/Psych: ( - ) mood change, ( - ) new changes  All other systems were reviewed with the patient and are negative.  PHYSICAL EXAMINATION:  Vitals:   02/16/21 1322  BP: 128/72  Pulse: 71  Resp: 18  Temp: 98.2 F (36.8 C)  SpO2: 98%   Filed Weights   02/16/21 1322  Weight: (!)  304 lb 14.4 oz (138.3 kg)    GENERAL: well appearing elderly Caucasian male, in NAD  SKIN: skin color, texture, turgor are normal, no rashes or significant lesions EYES: conjunctiva are pink and non-injected, sclera clear LUNGS: clear to auscultation and percussion with normal breathing effort HEART: regular rate & rhythm and no murmurs and no lower extremity edema Musculoskeletal: no cyanosis of digits and no clubbing  PSYCH: alert & oriented x 3, fluent speech NEURO: no focal motor/sensory deficits  LABORATORY DATA:  I have reviewed the data as listed CBC Latest Ref Rng & Units 02/16/2021 08/15/2018 04/30/2018  WBC 4.0 - 10.5 K/uL 5.1 6.2 5.3  Hemoglobin 13.0 - 17.0 g/dL 14.8 16.5 15.9  Hematocrit 39.0 - 52.0 % 43.1 46.7 45.8  Platelets 150 - 400 K/uL 192 219 204    CMP Latest Ref Rng & Units 02/16/2021 02/15/2021 02/01/2021  Glucose 70 - 99 mg/dL 163(H) 195(H) 211(H)  BUN 8 - 23 mg/dL '11 10 15  '$ Creatinine 0.61 - 1.24 mg/dL 1.28(H) 1.17 1.19  Sodium 135 - 145 mmol/L 141 139 136  Potassium 3.5 - 5.1 mmol/L 4.1 3.9 4.0  Chloride 98 - 111 mmol/L 104 99 95(L)  CO2 22 - 32 mmol/L '26 24 24  '$ Calcium 8.9 - 10.3 mg/dL 8.9 8.5(L) 9.2  Total Protein 6.5 - 8.1 g/dL 6.8 - -  Total Bilirubin 0.3 - 1.2 mg/dL 0.4 - -  Alkaline Phos 38 - 126 U/L 62 - -  AST 15 - 41 U/L 27 - -  ALT 0 - 44 U/L 26 - -    RADIOGRAPHIC STUDIES: CT Chest High Resolution  Result Date: 02/08/2021 CLINICAL DATA:  Dyspnea on exertion, history of blood clots EXAM: CT CHEST WITHOUT CONTRAST TECHNIQUE: Multidetector CT imaging of the chest was performed following the standard protocol without intravenous contrast. High resolution imaging of the lungs, as well as inspiratory and expiratory imaging, was performed. COMPARISON:  Outside CT chest angiogram, 12/16/2020 FINDINGS: Cardiovascular: Aortic atherosclerosis. Normal heart size. Extensive 3 vessel coronary artery calcifications and/or stents. No pericardial effusion.  Mediastinum/Nodes: No enlarged mediastinal, hilar, or axillary lymph nodes. Thyroid gland, trachea, and esophagus demonstrate no significant findings. Lungs/Pleura: Mild paraseptal emphysema. Bandlike scarring and volume loss of the left lung base with elevation of the left hemidiaphragm, with minimal scarring and atelectasis of the right lung base. No evidence of fibrotic interstitial lung disease. No significant air trapping on expiratory phase imaging. No pleural effusion or pneumothorax. Upper Abdomen: No acute abnormality.  Adjustable gastric lap band. Musculoskeletal: No chest wall mass or suspicious bone lesions identified. IMPRESSION: 1. No evidence of fibrotic interstitial lung disease. 2. Bandlike scarring and volume loss of the left greater than right lung bases. 3. Mild emphysema. 4. Coronary artery disease. Aortic Atherosclerosis (ICD10-I70.0) and Emphysema (ICD10-J43.9). Electronically Signed   By: Eddie Candle M.D.   On: 02/08/2021 11:43   ECHOCARDIOGRAM COMPLETE  Result Date: 02/03/2021    ECHOCARDIOGRAM REPORT   Patient Name:   [name] Date of Exam: 02/03/2021 Medical Rec #:  [id number]        Height:       72.0 in Accession #:    [id number]       Weight:       300.4 lb Date of Birth:  03/14/1956  BSA:          2.533 m Patient Age:    65 years         BP:           126/64 mmHg Patient Gender: M                HR:           74 bpm. Exam Location:  [place] Procedure: 2D Echo, Cardiac Doppler and Color Doppler Indications:    I50.9* Heart failure (unspecified)  History:        Patient has prior history of Echocardiogram examinations, most                 recent 07/20/2009. CAD; Risk Factors:Hypertension, Diabetes and                 Dyslipidemia. Abdominal aortic aneurysm. Obstructive sleep                 apnea. Obesity. Back pain.  Sonographer:    Diamond Nickel RCS Referring Phys: [id number] [facility] A CHANDRASEKHAR IMPRESSIONS  1. Left ventricular ejection fraction, by estimation,  is 60 to 65%. The left ventricle has normal function. The left ventricle has no regional wall motion abnormalities. There is moderate left ventricular hypertrophy. Left ventricular diastolic parameters are consistent with Grade I diastolic dysfunction (impaired relaxation).  2. Right ventricular systolic function is normal. The right ventricular size is normal.  3. The mitral valve is abnormal. Trivial mitral valve regurgitation. Moderate mitral annular calcification.  4. The aortic valve is tricuspid. Aortic valve regurgitation is not visualized. Comparison(s): Prior images unable to be directly viewed, comparison made by report only. Changes from prior study are noted. [id number]: Normal systolic function, mild LVH, impaired relaxation, aortic sclerosis. FINDINGS  Left Ventricle: Left ventricular ejection fraction, by estimation, is 60 to 65%. The left ventricle has normal function. The left ventricle has no regional wall motion abnormalities. The left ventricular internal cavity size was normal in size. There is  moderate left ventricular hypertrophy. Left ventricular diastolic parameters are consistent with Grade I diastolic dysfunction (impaired relaxation). Indeterminate filling pressures. Right Ventricle: The right ventricular size is normal. No increase in right ventricular wall thickness. Right ventricular systolic function is normal. Left Atrium: Left atrial size was normal in size. Right Atrium: Right atrial size was normal in size. Pericardium: There is no evidence of pericardial effusion. Mitral Valve: The mitral valve is abnormal. Moderate mitral annular calcification. Trivial mitral valve regurgitation. Tricuspid Valve: The tricuspid valve is grossly normal. Tricuspid valve regurgitation is trivial. Aortic Valve: The aortic valve is tricuspid. Aortic valve regurgitation is not visualized. Pulmonic Valve: The pulmonic valve was normal in structure. Pulmonic valve regurgitation is not visualized. Aorta:  The aortic root and ascending aorta are structurally normal, with no evidence of dilitation. Venous: The inferior vena cava was not well visualized. IAS/Shunts: No atrial level shunt detected by color flow Doppler.  LEFT VENTRICLE PLAX 2D LVIDd:         4.50 cm  Diastology LVIDs:         3.50 cm  LV e' medial:    6.96 cm/s LV PW:         1.40 cm  LV E/e' medial:  11.4 LV IVS:        1.30 cm  LV e' lateral:   7.94 cm/s LVOT diam:     2.30 cm  LV E/e' lateral: 10.0 LV SV:  74 LV SV Index:   29 LVOT Area:     4.15 cm  RIGHT VENTRICLE RV Basal diam:  2.70 cm RV S prime:     13.20 cm/s TAPSE (M-mode): 2.0 cm LEFT ATRIUM             Index       RIGHT ATRIUM           Index LA diam:        3.50 cm 1.38 cm/m  RA Area:     12.90 cm LA Vol (A2C):   79.0 ml 31.19 ml/m RA Volume:   28.80 ml  11.37 ml/m LA Vol (A4C):   46.4 ml 18.32 ml/m LA Biplane Vol: 61.8 ml 24.40 ml/m  AORTIC VALVE LVOT Vmax:   91.10 cm/s LVOT Vmean:  55.600 cm/s LVOT VTI:    0.177 m  AORTA Ao Root diam: 3.50 cm MITRAL VALVE MV Area (PHT): 2.90 cm     SHUNTS MV Decel Time: 262 msec     Systemic VTI:  0.18 m MV E velocity: 79.60 cm/s   Systemic Diam: 2.30 cm MV A velocity: 130.00 cm/s MV E/A ratio:  0.61 Lyman Bishop MD Electronically signed by Lyman Bishop MD Signature Date/Time: 02/03/2021/4:13:56 PM    Final     ASSESSMENT & PLAN [name] 65 y.o. male with medical history significant for GERD, fatty liver disease, DM type II, HLD, OSA and HTN who presents for evaluation of a segmental pulmonary embolism.   After review of the labs, review of the records, and discussion with the patient the patients findings are most consistent with incidental finding of a segmental pulmonary thromboembolism in the right lower lobe.  The patient does travel a great deal and he is overweight which may be contributing to his blood clot, however these are not necessarily modifiable risk factors.  Given the incidental nature of this clot is unclear  if this represents a provoked or unprovoked VTE.  In the interest of being cautious I would recommend we treat this as an unprovoked.  # Unprovoked RLL Segmental Pulmonary Embolism -- Findings are consistent with an unprovoked right lower lobe segmental pulmonary embolism --Given this finding would recommend indefinite anticoagulation --Continue treatment with Xarelto 20 mg p.o. daily.  We will continue this therapy for 6 months time and then reevaluate and consider decreasing down to maintenance dose --Return to clinic in 3 months time to assure the patient is tolerating anticoagulation therapy well.  Orders Placed This Encounter  Procedures   CBC with Differential ([place] Only)    Standing Status:   Future    Number of Occurrences:   1    Standing Expiration Date:   02/16/2022   CMP ([facility] only)    Standing Status:   Future    Number of Occurrences:   1    Standing Expiration Date:   02/16/2022   Factor 5 Leiden*    Standing Status:   Future    Number of Occurrences:   1    Standing Expiration Date:   02/16/2022   Prothrombin gene mutation*    Standing Status:   Future    Number of Occurrences:   1    Standing Expiration Date:   02/16/2022   Beta-2-glycoprotein i abs, IgG/M/A    Standing Status:   Future    Number of Occurrences:   1    Standing Expiration Date:   02/16/2022   Cardiolipin antibodies, IgG, IgM, IgA*  Standing Status:   Future    Number of Occurrences:   1    Standing Expiration Date:   02/16/2022    All questions were answered. The patient knows to call the clinic with any problems, questions or concerns.  A total of more than 60 minutes were spent on this encounter with face-to-face time and non-face-to-face time, including preparing to see the patient, ordering tests and/or medications, counseling the patient and coordination of care as outlined above.   Ledell Peoples, MD Department of Hematology/Oncology [facility] Phone: [phone] Pager: [phone] Email: [email or web address]  02/22/2021 8:32 AM

## 2021-02-18 ENCOUNTER — Encounter: Payer: Self-pay | Admitting: Hematology and Oncology

## 2021-02-18 LAB — CARDIOLIPIN ANTIBODIES, IGG, IGM, IGA
Anticardiolipin IgA: <9 APL U/mL (ref 0–11)
Anticardiolipin IgG: <9 GPL U/mL (ref 0–14)
Anticardiolipin IgM: 21 MPL U/mL — ABNORMAL HIGH (ref 0–12)

## 2021-02-18 LAB — BETA-2-GLYCOPROTEIN I ABS, IGG/M/A
Beta-2 Glyco I IgG: <9 GPI IgG units (ref 0–20)
Beta-2-Glycoprotein I IgA: <9 GPI IgA units (ref 0–25)
Beta-2-Glycoprotein I IgM: <9 GPI IgM units (ref 0–32)

## 2021-02-21 ENCOUNTER — Other Ambulatory Visit: Payer: Medicare Other

## 2021-02-21 LAB — FACTOR 5 LEIDEN

## 2021-02-21 LAB — PROTHROMBIN GENE MUTATION

## 2021-02-22 ENCOUNTER — Encounter (HOSPITAL_BASED_OUTPATIENT_CLINIC_OR_DEPARTMENT_OTHER): Payer: Medicare Other | Admitting: Physician Assistant

## 2021-02-22 ENCOUNTER — Encounter: Payer: Self-pay | Admitting: Hematology and Oncology

## 2021-02-22 NOTE — Progress Notes (Signed)
Subjective:  65 y.o. male with PMHx of diabetes mellitus presenting today for follow-up evaluation of a plantar ulcer to the right hallux.  Patient has been going to the wound care center for treatment.  Patient states that on 02/08/2021 a total contact cast was applied to the right lower extremity.  It was then removed on Friday.  Patient noticed that the toe began to be very red and swollen the following day.  He presents for further treatment and evaluation.   Past Medical History:  Diagnosis Date   Abdominal aortic aneurysm (AAA), 30-34 mm diameter ([place]) 06/01/2017   Nml w/ greatest dm 3 cm US Aorta 06/2016, Abd [place] 05/01/2017 showed infrarenal dilatation at 3.4 cm - rec repeat US in 3 yrs.   Arthritis    Cervical disc disease 12/26/2011   Coronary artery disease involving native coronary artery of native heart without angina pectoris 12/01/2016   He had coronary angiography in 2011. There was irregularity within the LAD with up to 30% narrowing. The myocardial perfusion imaging done 11/07/2015 did not demonstrate any evidence of ischemia with a low risk nuclear stress test other than EF estimated at 47%. Chest CT 01/25/2016 showed diffuse coronary artery calcifications with heavy calcifications in the LAD. Pt seen by cardiology Dr. Linard Millers    Diabetes mellitus    Difficult intubation 04/05/2014   Dyslipidemia    Family history of anesthesia complication    pt states took days for his father to awaken after mask was used    Fatty liver 06/01/2017   [place] 05/01/2017   GERD (gastroesophageal reflux disease)    has been having acid reflux since recent endoscopy    H/O hiatal hernia    repair with lap band, now has ventral hernia midline abd   Hearing loss-aides 03/26/2012   Hyperlipidemia    Hypertension    Morbid obesity ([place])    OSA (obstructive sleep apnea)    Rotator cuff tear    Seasonal allergies    Shortness of breath    pt states related to high BP meds with extended walking or  climbling stairs   Sleep apnea      Objective/Physical Exam General: The patient is alert and oriented x3 in no acute distress.  Dermatology:  Wound #1 noted to the plantar aspect of the right great toe measuring approximately 0.5 x 0.5 x 0.2 cm (LxWxD).   To the noted ulceration(s), there is no eschar. There is a moderate amount of slough, fibrin, and necrotic tissue noted. Granulation tissue and wound base is red. There is a minimal amount of serosanguineous drainage noted. There is no exposed bone muscle-tendon ligament or joint. There is no malodor. Periwound integrity is intact. Skin is warm, dry and supple bilateral lower extremities.  Vascular: Palpable pedal pulses bilaterally.  Capillary refill within normal limits.  There is some erythema with edema encompassing the entire forefoot right lower extremity.  Concerning for an acute cellulitis  Neurological: Epicritic and protective threshold diminished bilaterally.   Musculoskeletal Exam: Range of motion within normal limits to all pedal and ankle joints bilateral. Muscle strength 5/5 in all groups bilateral.   Assessment: 1.  Ulcer right great toe secondary to diabetes mellitus 2. diabetes mellitus w/ peripheral neuropathy 3.  Cellulitis right forefoot   Plan of Care:  1. Patient was evaluated. 2. medically necessary excisional debridement including subcutaneous tissue was performed using a tissue nipper and a chisel blade. Excisional debridement of all the necrotic nonviable tissue  down to healthy bleeding viable tissue was performed with post-debridement measurements same as pre-. 3. the wound was cleansed and dry sterile dressing applied. 4.  Cultures were taken of the wound and sent to pathology for culture and sensitivity 5.  Prescription for doxycycline 100 mg 2 times daily #20.  Also prescription for pantoprazole was provided 6.  Offloading felt dancers pads were also applied to the insoles of the patient's shoes to  offload pressure from the great toe 7.  Continue follow-up and management of the wound at the wound care center  8.  Return to clinic in 3 weeks   Edrick Kins, DPM [facility]  Dr. Edrick Kins, DPM    [address], [place] [address]                Office [phone]  Fax [phone]

## 2021-02-23 ENCOUNTER — Telehealth: Payer: Self-pay | Admitting: *Deleted

## 2021-02-23 NOTE — Telephone Encounter (Signed)
TCT patient regarding labs results. Spoke with pt and advised that his lab work did not reveal any clotting disorders. Dr. Lorenso Courier considers his blood clot  as unprovoked and will continue on his Xarelto. Pt asked if his Xarelto or antibiotics altered his labs at all. Advised that they do not. Pt voiced understanding. He is aware of his f/u appt in 3 months.

## 2021-02-23 NOTE — Telephone Encounter (Signed)
-----   Message from Orson Slick, MD sent at 02/22/2021  8:21 AM EDT ----- Please let Mr. Kosior know that his bloodwork did not show a clear hypercoagulable disorder. His blood clot is considered unprovoked. We will see him back in 3 months to assure he is doing well on his Xarelto therapy.   ----- Message ----- From: Buel Ream, Lab In [place]: 02/16/2021   3:17 PM EDT To: Orson Slick, MD

## 2021-02-24 ENCOUNTER — Encounter: Payer: Self-pay | Admitting: Podiatry

## 2021-02-24 LAB — PULMONARY FUNCTION TEST
DL/VA % pred: 143 %
DL/VA: 5.88 ml/min/mmHg/L
DLCO unc % pred: 105 %
DLCO unc: 30.12 ml/min/mmHg
FEF 25-75 Post: 4.01 L/sec
FEF 25-75 Pre: 2.85 L/sec
FEF2575-%Change-Post: 40 %
FEF2575-%Pred-Post: 137 %
FEF2575-%Pred-Pre: 97 %
FEV1-%Change-Post: 12 %
FEV1-%Pred-Post: 75 %
FEV1-%Pred-Pre: 67 %
FEV1-Post: 2.8 L
FEV1-Pre: 2.49 L
FEV1FVC-%Change-Post: 0 %
FEV1FVC-%Pred-Pre: 111 %
FEV6-%Change-Post: 11 %
FEV6-%Pred-Post: 70 %
FEV6-%Pred-Pre: 63 %
FEV6-Post: 3.33 L
FEV6-Pre: 2.98 L
FEV6FVC-%Pred-Post: 105 %
FEV6FVC-%Pred-Pre: 105 %
FVC-%Change-Post: 11 %
FVC-%Pred-Post: 67 %
FVC-%Pred-Pre: 60 %
FVC-Post: 3.33 L
FVC-Pre: 2.98 L
Post FEV1/FVC ratio: 84 %
Post FEV6/FVC ratio: 100 %
Pre FEV1/FVC ratio: 84 %
Pre FEV6/FVC Ratio: 100 %
RV % pred: 109 %
RV: 2.69 L
TLC % pred: 78 %
TLC: 5.84 L

## 2021-02-25 ENCOUNTER — Other Ambulatory Visit: Payer: Self-pay | Admitting: Podiatry

## 2021-02-25 DIAGNOSIS — M7989 Other specified soft tissue disorders: Secondary | ICD-10-CM

## 2021-02-27 ENCOUNTER — Other Ambulatory Visit: Payer: Self-pay

## 2021-02-27 ENCOUNTER — Ambulatory Visit (HOSPITAL_COMMUNITY)
Admission: RE | Admit: 2021-02-27 | Discharge: 2021-02-27 | Disposition: A | Discharge status: Home / Self Care | Payer: Medicare Other | Source: Ambulatory Visit | Attending: Podiatry | Admitting: Podiatry

## 2021-02-27 ENCOUNTER — Telehealth: Payer: Self-pay | Admitting: *Deleted

## 2021-02-27 DIAGNOSIS — M7989 Other specified soft tissue disorders: Secondary | ICD-10-CM | POA: Insufficient documentation

## 2021-02-27 NOTE — Telephone Encounter (Signed)
[facility] is calling w/ results of the B/ l lower extremity venus. Negative for DVT. Any questions ,please call [phone].

## 2021-02-28 ENCOUNTER — Encounter: Payer: Self-pay | Admitting: Podiatry

## 2021-02-28 ENCOUNTER — Ambulatory Visit (INDEPENDENT_AMBULATORY_CARE_PROVIDER_SITE_OTHER): Payer: Medicare Other | Admitting: Podiatry

## 2021-02-28 ENCOUNTER — Ambulatory Visit (INDEPENDENT_AMBULATORY_CARE_PROVIDER_SITE_OTHER): Payer: Medicare Other

## 2021-02-28 VITALS — Temp 97.9°F

## 2021-02-28 DIAGNOSIS — L97519 Non-pressure chronic ulcer of other part of right foot with unspecified severity: Secondary | ICD-10-CM

## 2021-02-28 DIAGNOSIS — L03115 Cellulitis of right lower limb: Secondary | ICD-10-CM

## 2021-02-28 DIAGNOSIS — E11621 Type 2 diabetes mellitus with foot ulcer: Secondary | ICD-10-CM | POA: Diagnosis not present

## 2021-02-28 DIAGNOSIS — I2510 Atherosclerotic heart disease of native coronary artery without angina pectoris: Secondary | ICD-10-CM | POA: Diagnosis not present

## 2021-02-28 LAB — CBC WITH DIFFERENTIAL/PLATELET
Absolute Monocytes: 413 cells/uL (ref 200–950)
Basophils Absolute: 22 cells/uL (ref 0–200)
Basophils Relative: 0.5 %
Eosinophils Absolute: 112 cells/uL (ref 15–500)
Eosinophils Relative: 2.6 %
HCT: 43 % (ref 38.5–50.0)
Hemoglobin: 14.6 g/dL (ref 13.2–17.1)
Lymphs Abs: 1776 cells/uL (ref 850–3900)
MCH: 32.3 pg (ref 27.0–33.0)
MCHC: 34 g/dL (ref 32.0–36.0)
MCV: 95.1 fL (ref 80.0–100.0)
MPV: 9.2 fL (ref 7.5–12.5)
Monocytes Relative: 9.6 %
Neutro Abs: 1978 cells/uL (ref 1500–7800)
Neutrophils Relative %: 46 %
Platelets: 121 10*3/uL — ABNORMAL LOW (ref 140–400)
RBC: 4.52 10*6/uL (ref 4.20–5.80)
RDW: 11.9 % (ref 11.0–15.0)
Total Lymphocyte: 41.3 %
WBC: 4.3 10*3/uL (ref 3.8–10.8)

## 2021-03-01 ENCOUNTER — Other Ambulatory Visit: Payer: Self-pay | Admitting: Podiatry

## 2021-03-01 DIAGNOSIS — D696 Thrombocytopenia, unspecified: Secondary | ICD-10-CM

## 2021-03-02 ENCOUNTER — Encounter: Payer: Self-pay | Admitting: *Deleted

## 2021-03-02 ENCOUNTER — Encounter: Payer: Self-pay | Admitting: Podiatry

## 2021-03-03 ENCOUNTER — Other Ambulatory Visit: Payer: Self-pay

## 2021-03-03 ENCOUNTER — Encounter: Payer: Self-pay | Admitting: Podiatry

## 2021-03-03 ENCOUNTER — Ambulatory Visit (INDEPENDENT_AMBULATORY_CARE_PROVIDER_SITE_OTHER): Payer: Medicare Other | Admitting: Podiatry

## 2021-03-03 DIAGNOSIS — L03115 Cellulitis of right lower limb: Secondary | ICD-10-CM

## 2021-03-03 DIAGNOSIS — I2510 Atherosclerotic heart disease of native coronary artery without angina pectoris: Secondary | ICD-10-CM | POA: Diagnosis not present

## 2021-03-03 DIAGNOSIS — L97519 Non-pressure chronic ulcer of other part of right foot with unspecified severity: Secondary | ICD-10-CM

## 2021-03-03 DIAGNOSIS — E11621 Type 2 diabetes mellitus with foot ulcer: Secondary | ICD-10-CM

## 2021-03-03 MED ORDER — CICLOPIROX 0.77 % EX GEL: 1.0000 "application " | Freq: Two times a day (BID) | CUTANEOUS | 0 refills | Status: DC

## 2021-03-03 MED ORDER — SILVER SULFADIAZINE 1 % EX CREA: 1.0000 "application " | TOPICAL_CREAM | Freq: Every day | CUTANEOUS | 0 refills | Status: DC

## 2021-03-05 NOTE — Progress Notes (Signed)
Subjective: 65 year old male presents the office today for follow-up evaluation of an ulcer to the plantar aspect the right hallux.  He was last seen by Dr. Amalia Hailey.  Wound culture was obtained and he was switched to Zyvox which he did have some diarrhea with it seems to be improved since stopping the medication.  He has no fevers or chills.  He has no other concerns today.  He presents the office today with his wife.  Objective: AAO x3, NAD DP/PT pulses palpable bilaterally, CRT less than 3 seconds On the plantar aspect the left hallux is a hyperkeratotic tissue with some dried blood with central granular wound present.  After debridement of the area it measures 0.3 x 0.3 x 0.1 cm without any probing to bone, undermining or tunneling.  Prior to wound debridement the wound was smaller at 0.3 x 0.2 and was covered with callus.  There is no surrounding edema, erythema.  There is no fluctuation crepitation.  There is no malodor. No pain with calf compression, swelling, warmth, erythema     Assessment: Ulceration right hallux  Plan: -All treatment options discussed with the patient including all alternatives, risks, complications.  -Repeat x-rays obtained and reviewed.  There is edema noted.  No obvious cortical destruction present.  Spurring present off the lateral distal phalanx. -Sharply debrided the wound today utilizing a #312 blade scalpel to healthy tissue to remove nonviable, devitalized tissue to help promote wound healing.  Recommended Silvadene dressing changes daily.  We will hold off on further antibiotics.  Recheck CBC to check platelet count after Zyvox. -Patient encouraged to call the office with any questions, concerns, change in symptoms.   Trula Slade DPM

## 2021-03-06 ENCOUNTER — Ambulatory Visit: Payer: Medicare Other | Admitting: Podiatry

## 2021-03-06 MED ORDER — SILVER SULFADIAZINE 1 % EX CREA: 1.0000 "application " | TOPICAL_CREAM | Freq: Every day | CUTANEOUS | 0 refills | Status: DC

## 2021-03-06 MED ORDER — CICLOPIROX 0.77 % EX GEL: 1.0000 "application " | Freq: Two times a day (BID) | CUTANEOUS | 2 refills | Status: DC

## 2021-03-06 NOTE — Progress Notes (Signed)
Subjective: 65 year old male presents the office today for follow-up evaluation of an ulcer to the plantar aspect the right hallux.  He has finished his Zyvox and he states he was doing well however yesterday started noticing increased swelling or redness to the area.  He had doxycycline at home which she started taking he states is better today as far as the redness and swelling.  He has not seen any purulence coming from the wound.  No red streaks.  No fevers or chills.  He has no other concerns.  Objective: AAO x3, NAD DP/PT pulses palpable bilaterally, CRT less than 3 seconds On the plantar aspect the left hallux is a hyperkeratotic tissue with some dried blood with central granular wound present.  After debridement of the area it measures about a centimeter in diam of 0.3 x 0.3 cm however is almost scabbed over superficial.  There is no probing, undermining or tunneling.  There is no drainage or pus.  There is no fluctuation crepitation.  There is no malodor.  There is mild edema and erythema to the toe to the first MPJ.  No ascending cellulitis.  Mild chronic appearing edema to the foot. Interdigitally there is tinea pedis present on the interspaces with any skin breakdown or drainage. No pain with calf compression, swelling, warmth, erythema  Assessment: Ulceration right hallux  Plan: -All treatment options discussed with the patient including all alternatives, risks, complications.  -Postoperatively the hyperkeratotic lesion, ulceration with any complications utilizing #771 with scalpel down to healthy, granular tissue.  No blood loss.  Tolerated well.  To continue with Silvadene dressing changes.  Although based on the previous cultures doxycycline is resistant to the culture and he seems to have improved today compared to the picture she showed yesterday.  We will not continue with this but if there is any worsening he is to go directly to the emergency department I stressed the importance  of this as he will need IV antibiotics.  I discussed with him also times about reoccurrence of the redness and swelling want to come off of antibiotics.  If this continues we will need to repeat the MRI. -Ciclopirox gel interdigitally. -Monitor for any clinical signs or symptoms of infection and directed to call the office immediately should any occur or go to the ER.  No follow-ups on file.  Trula Slade DPM

## 2021-03-07 ENCOUNTER — Telehealth: Payer: Self-pay | Admitting: *Deleted

## 2021-03-07 ENCOUNTER — Other Ambulatory Visit: Payer: Self-pay

## 2021-03-07 ENCOUNTER — Encounter: Payer: Self-pay | Admitting: Podiatry

## 2021-03-07 ENCOUNTER — Ambulatory Visit (INDEPENDENT_AMBULATORY_CARE_PROVIDER_SITE_OTHER): Payer: Medicare Other

## 2021-03-07 ENCOUNTER — Ambulatory Visit (INDEPENDENT_AMBULATORY_CARE_PROVIDER_SITE_OTHER): Payer: Medicare Other | Admitting: Podiatry

## 2021-03-07 DIAGNOSIS — L97519 Non-pressure chronic ulcer of other part of right foot with unspecified severity: Secondary | ICD-10-CM

## 2021-03-07 DIAGNOSIS — L03115 Cellulitis of right lower limb: Secondary | ICD-10-CM

## 2021-03-07 DIAGNOSIS — E11621 Type 2 diabetes mellitus with foot ulcer: Secondary | ICD-10-CM | POA: Diagnosis not present

## 2021-03-07 DIAGNOSIS — B353 Tinea pedis: Secondary | ICD-10-CM | POA: Diagnosis not present

## 2021-03-07 DIAGNOSIS — I2510 Atherosclerotic heart disease of native coronary artery without angina pectoris: Secondary | ICD-10-CM | POA: Diagnosis not present

## 2021-03-07 MED ORDER — SILVER SULFADIAZINE 1 % EX CREA: 1.0000 "application " | TOPICAL_CREAM | Freq: Every day | CUTANEOUS | 0 refills | Status: DC

## 2021-03-07 MED ORDER — CICLOPIROX 0.77 % EX GEL: 1.0000 "application " | Freq: Two times a day (BID) | CUTANEOUS | 0 refills | Status: DC

## 2021-03-07 NOTE — Telephone Encounter (Signed)
Amber w/ [facility] [phone]) is requesting the days supply on both prescriptions e-scribed a few days ago(Ciclopirox-0.77%,Silvadiazine 1%cream) Please advise.

## 2021-03-08 ENCOUNTER — Ambulatory Visit (INDEPENDENT_AMBULATORY_CARE_PROVIDER_SITE_OTHER): Payer: Medicare Other | Admitting: Primary Care

## 2021-03-08 DIAGNOSIS — J438 Other emphysema: Secondary | ICD-10-CM | POA: Diagnosis not present

## 2021-03-08 DIAGNOSIS — Z86711 Personal history of pulmonary embolism: Secondary | ICD-10-CM

## 2021-03-08 NOTE — Patient Instructions (Signed)
Recommend therapeutic trial Adviar HFA 115-10mcg 1-2 puffs twice daily x 2 week, if no benefit can discontinue. Encourage weight loss and exercise regimen.

## 2021-03-08 NOTE — Progress Notes (Signed)
Virtual Visit via Telephone Note  I connected with [name] on 03/08/21 at 11:00 AM EDT by telephone and verified that I am speaking with the correct person using two identifiers.  Location: Patient: Home Provider: Office    I discussed the limitations, risks, security and privacy concerns of performing an evaluation and management service by telephone and the availability of in person appointments. I also discussed with the patient that there may be a patient responsible charge related to this service. The patient expressed understanding and agreed to proceed.   History of Present Illness: 65 year old male, former smoker quit 1993 (80-pack-year history).  Past medical history significant for hypertension, heart failure, coronary artery disease, elevated diaphragm, obstructive sleep apnea, type 2 diabetes, fatty liver, hyperlipidemia, obesity.  Patient of Dr. Chase Caller, in for consult for dyspnea on 02/02/2021.  Previous [facility] pulmonary encounter: 02/02/2021 -   Chief Complaint  Patient presents with   Consult    Former pt last seen 01/20/16.  Pt states that he has had problems with SOB x6 months that happens with exertion.   [name] 65 y.o. -personally not seen in 5 years.  Therefore there is a new consult.  He is here with his wife.  Since I last saw him he is now retired.  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  He tells me for the last 6 months or so he has had insidious onset of shortness of breath present on exertion such as walking from the car to the front desk of this office.  Relieved by rest.  Since its onset it is stable.  No associated chest pain.  There is no associated cough or wheezing.  No paroxysmal nocturnal dyspnea.  No orthopnea no weight gain.  Cardiologist has increased Lasix for pedal edema.    He had a stress test several years ago that was normal but he had low ejection fraction.  Most recently seen Dr. Ezra Sites at [place] cardiologist who is getting an  echo on him.  He also has a longstanding history of sleep apnea.  He no longer has a sleep doctor but uses auto CPAP which he thinks is mostly set at 12.  There is no oxygen use at night.  He also has a wound on the plantar aspect of his right hallux for which he sees podiatry.  He has knee issues.  He has obesity, AAA, OSA on CPAP, diabetes, hypertension.  In the midst of all this he had a routine CT scan of the chest with [facility] on December 16, 2020 and this showed a small pulmonary embolism on the right side without any RV strain.  He was started on outpatient Xarelto by his primary care physician.  He was never hospitalized for this never on oxygen for this.  He wants to discuss this as well.  He and his wife have questions on severity of pulmonary embolism.  He wanted to know the size of it on the CT scan.  The CT scan was sent for upload to PACS system but I do not visualize the image.  He is currently on full dose Xarelto.  The only scan that I can visualize is the he 2017 CT scan that looks clear upon my personal visualization.   Simple office walk 185 feet x  3 laps goal with forehead probe 02/02/2021   O2 used ra  Number laps completed 3  Comments about pace avg  Resting Pulse Ox/HR 99% and 74/min  Final Pulse Ox/HR 97% and 94/min  Desaturated </= 88% no  Desaturated <= 3% points no  Got Tachycardic >/= 90/min yes  Symptoms at end of test Mild dyspnea  Miscellaneous comments x    02/15/2021 Presents today for 2-week follow-up with PFTs. He reports getting winded with activity but recovered with rest. He has a rare cough. He underwent high-resolution CAT scan this month that showed no evidence of fibrotic interstitial lung disease. He had some bandlike scarring and volume loss to lung bases along with mild emphysema and coronary artery disease. Echocardiogram consistent with heart failure with preserved ejection fraction. He is on lasix 40mg  twice daily. Overnight oximetry on  CPAP was completed 2 days ago and results are pending. He has been referred to hematology to discuss length of anticoagulation for hx PE. He is currently being treated for cellulitis with doxycycline. No fevers.   History of pulmonary embolus (PE) - D-dimer was normal, follow up with hematology for anticoagulation duration    OSA (obstructive sleep apnea) - Continue CPAP nightly, awaiting ONO results      03/08/2021- interim hx  Patient presents today for televisit. He has yet to try Advair because he states that he has not been that active. He still gets short winded, he attributes this to being overweight and right lower lobe pulmonary embolism which was diagnosed in July 2022. He was having shortness of breath at the time that he believed was related to covid-19. He has an 80 pack year smoking hx. He travels a lot for work and is obese which are also contributing factors. He continues to take Xarelto. He saw hematology on September 1st, he will need to be on anticoagulation indefinitely, he will need to continue Xarelto 20mg  daily for 6 months and then will reevaluate and consider decreasing to maintenance dose. He has a rare cough with some associated wheezing.     Observations/Objective:  - Able to speak in full sentences; no overt shortness of breath, wheezing or cough   Pulmonary function test 02/15/2021 PFT-FVC 3.33 (67%), FEV1 3.80 (75%), ratio 84, TLC 78%, DLCO 30.12 (105%) Moderate restrictive defect with positive bronchodilator.  Cardiac testing: 02/03/21 Echocardiogram-EF 60 to 65%, moderate left ventricular hypertrophy.  Grade 1 diastolic dysfunction.  Moderate mitral annular calcification.    Imaging: 02/08/21 HRCT- No evidence of fibrotic interstitial lung disease. Bandlike scarring and volume loss of the left greater than right lung bases. Mild emphysema. Coronary artery disease  02/15/2021 FENO- 19  Assessment and Plan:  Dyspnea on exertion: - Multi factorial related to  obesity, deconditioning, diastolic dysfunction and recent PE. He does have some asthma features along with mild emphysema on imaging. No evidence of ILD. PFTs showed mild-moderate restriction with +bronchodilator response. FENO and Eosinophils are normal. Recommend therapeutic trial Adviar HFA 115-74mcg 1-2 puffs twice daily x 2 week, if no benefit can discontinue. Encourage weight loss and exercise regimen.   Pulmonary embolism: - Following with hematology. He will need to be on anticoagulation indefinitely, continue Xarelto 20mg  daily for 6 months and then will reevaluate and consider decreasing to maintenance dose   Follow Up Instructions:  - 3 months with Dr. Chase Caller    I discussed the assessment and treatment plan with the patient. The patient was provided an opportunity to ask questions and all were answered. The patient agreed with the plan and demonstrated an understanding of the instructions.   The patient was advised to call back or seek an in-person evaluation if the symptoms  worsen or if the condition fails to improve as anticipated.  I provided 30 minutes of non-face-to-face time during this encounter.   Martyn Ehrich, NP

## 2021-03-08 NOTE — Telephone Encounter (Signed)
Called and spoke with Katie yesterday at [facility] and relayed the message per Dr Jacqualyn Posey. Lattie Haw

## 2021-03-12 ENCOUNTER — Encounter: Payer: Self-pay | Admitting: Podiatry

## 2021-03-14 ENCOUNTER — Ambulatory Visit (INDEPENDENT_AMBULATORY_CARE_PROVIDER_SITE_OTHER): Payer: Medicare Other

## 2021-03-14 ENCOUNTER — Other Ambulatory Visit: Payer: Self-pay

## 2021-03-14 ENCOUNTER — Ambulatory Visit (INDEPENDENT_AMBULATORY_CARE_PROVIDER_SITE_OTHER): Payer: Medicare Other | Admitting: Podiatry

## 2021-03-14 DIAGNOSIS — E11621 Type 2 diabetes mellitus with foot ulcer: Secondary | ICD-10-CM

## 2021-03-14 DIAGNOSIS — L97519 Non-pressure chronic ulcer of other part of right foot with unspecified severity: Secondary | ICD-10-CM | POA: Diagnosis not present

## 2021-03-14 DIAGNOSIS — B353 Tinea pedis: Secondary | ICD-10-CM | POA: Diagnosis not present

## 2021-03-14 DIAGNOSIS — I2510 Atherosclerotic heart disease of native coronary artery without angina pectoris: Secondary | ICD-10-CM | POA: Diagnosis not present

## 2021-03-14 DIAGNOSIS — L03115 Cellulitis of right lower limb: Secondary | ICD-10-CM | POA: Diagnosis not present

## 2021-03-14 NOTE — Progress Notes (Signed)
Subjective: 65 year old male presents the office today for check of his left big toe.  Certainly there is some redness in between his toes but is doing much better today.  The wound itself is been doing well.  The redness is much improved as well as the swelling.  Objective: AAO x3, NAD DP/PT pulses palpable bilaterally, CRT less than 3 seconds Superficial wound present distal aspect of the left hallux.  It appears to be almost healed.  There is no significant edema, erythema or drainage or pus or any signs of infection.  Is no fluctuance or crepitation but there is no malodor.  There is also, erythematous interspaces consistent with tinea pedis.  No drainage or pus.  No other open lesions. No pain with calf compression, swelling, warmth, erythema  Assessment: 65 year old male with healing ulceration left hallux with resolving cellulitis; tinea  Plan: -All treatment options discussed with the patient including all alternatives, risks, complications.  -Likely debrided the wound today utilizing a number 312 with scalpel to any complications.  Continue with daily dressing changes.  Monitoring signs or signs of infection.  He is continue doxycycline which seems to be helping although long culture was resistant.  He will finish this. -Ciclopirox gel for the interspaces. -Patient encouraged to call the office with any questions, concerns, change in symptoms.   Trula Slade DPM

## 2021-03-17 ENCOUNTER — Encounter: Payer: Self-pay | Admitting: Podiatry

## 2021-03-17 NOTE — Progress Notes (Signed)
Subjective: 65 year old male presents the office today for check of his left big toe.  States he is doing much better.  No significant drainage or any increase in swelling or redness since completing antibiotics.  He also states that the athlete's foot between his toes is doing much better.  He did not get the ciclopirox but does not feel that he needs it.  He has no fevers or chills.  No other concerns.  Objective: AAO x3, NAD DP/PT pulses palpable bilaterally, CRT less than 3 seconds Superficial wound present distal aspect of the left hallux.  It seems the wound is almost completely healed and small scab is present.  There is hyperkeratotic tissue which I debrided today.  There is no drainage or pus.  No fluctuation crepitation.  There is no malodor.  No significant edema or erythema to the toe or foot.  There is still some mild interdigital tinea pedis present with no drainage or pus. No pain with calf compression, swelling, warmth, erythema  Assessment: 65 year old male with healing ulceration left hallux with resolving cellulitis; tinea  Plan: -All treatment options discussed with the patient including all alternatives, risks, complications.  -Overall doing much better.  I did lightly debride the hyperkeratotic tissue, with a scab without any complications or bleeding.  Appears to be almost completely healed at this time.  Continue with Silvadene dressing changes.  Offloading. -Continue athlete's foot feeling when she has been using interdigitally. -Monitor for any clinical signs or symptoms of infection and directed to call the office immediately should any occur or go to the ER.  Return in about 10 days (around 03/24/2021).  Trula Slade DPM

## 2021-03-20 NOTE — Telephone Encounter (Signed)
Please contact.

## 2021-03-21 ENCOUNTER — Encounter: Payer: Self-pay | Admitting: Podiatry

## 2021-03-21 ENCOUNTER — Ambulatory Visit (INDEPENDENT_AMBULATORY_CARE_PROVIDER_SITE_OTHER): Payer: Medicare Other | Admitting: Podiatry

## 2021-03-21 ENCOUNTER — Other Ambulatory Visit: Payer: Self-pay

## 2021-03-21 DIAGNOSIS — L97519 Non-pressure chronic ulcer of other part of right foot with unspecified severity: Secondary | ICD-10-CM | POA: Diagnosis not present

## 2021-03-21 DIAGNOSIS — B353 Tinea pedis: Secondary | ICD-10-CM | POA: Diagnosis not present

## 2021-03-21 DIAGNOSIS — E11621 Type 2 diabetes mellitus with foot ulcer: Secondary | ICD-10-CM | POA: Diagnosis not present

## 2021-03-21 DIAGNOSIS — I2510 Atherosclerotic heart disease of native coronary artery without angina pectoris: Secondary | ICD-10-CM | POA: Diagnosis not present

## 2021-03-23 NOTE — Telephone Encounter (Signed)
Please contact.

## 2021-03-26 NOTE — Progress Notes (Signed)
Subjective: 65 year old male presents the office today for check of his left big toe.  He presents today for same-day appointment as he is going to [place] tomorrow and wants to have the toe checked before he leaves.  His wife had noticed a secondary dark spot adjacent to the area of the wound that he wanted to have checked.  No increase in swelling or redness or any drainage.  No opening of the report.  Athlete's foot is been doing better with over-the-counter athlete's foot spray.  No new ulcerations.  No fevers or chills.  He is asking for a new  Pegassist as his is broken down if he needs to stay on it.  No other concerns.  Objective: AAO x3, NAD DP/PT pulses palpable bilaterally, CRT less than 3 seconds Hyperkeratotic tissue the distal aspect of the left hallux.  On the area the previous wound is scabbed over and upon debridement there is no ulceration but is preulcerative.  Just adjacent to this on the area the hyperkeratotic tissue with some dried blood present within it.  Upon debridement no ulceration to this area.  No other open lesions. No pain with calf compression, swelling, warmth, erythema  Assessment: 65 year old male preulcerative lesion left hallux   Plan: -All treatment options discussed with the patient including all alternatives, risks, complications.  -Sharp debrided hyperkeratotic tissue with any complications.  Although it appears only his heel still preulcerative, remain in surgical shoe for offloading.  A new peg assist was made and dispensed today.  Continue with dressing changes, offloading.  Monitor closely for any signs or symptoms of infection. -From athlete's foot standpoint is doing much better as well and almost resolved.  Trula Slade DPM

## 2021-03-28 ENCOUNTER — Ambulatory Visit: Payer: Medicare Other | Admitting: Podiatry

## 2021-03-28 ENCOUNTER — Telehealth: Payer: Self-pay | Admitting: Podiatry

## 2021-03-28 NOTE — Telephone Encounter (Signed)
Called and talked with [facility] @ Dr Dennie Fetters office and she stated they had faxed the paperwork for the shoes and I apologized but it did not come thru so I had her fax it to our office directly and I will forward to [place].  We did receive the fax to our office.

## 2021-03-30 ENCOUNTER — Encounter: Payer: Self-pay | Admitting: Podiatry

## 2021-03-30 ENCOUNTER — Other Ambulatory Visit: Payer: Self-pay | Admitting: Podiatry

## 2021-03-30 NOTE — Telephone Encounter (Signed)
Please advise 

## 2021-03-30 NOTE — Progress Notes (Signed)
error 

## 2021-03-30 NOTE — Progress Notes (Signed)
Called in prescription to pharmacy of doxycyline

## 2021-04-13 ENCOUNTER — Ambulatory Visit (INDEPENDENT_AMBULATORY_CARE_PROVIDER_SITE_OTHER): Payer: Medicare Other | Admitting: Podiatry

## 2021-04-13 ENCOUNTER — Other Ambulatory Visit: Payer: Self-pay

## 2021-04-13 VITALS — Temp 98.3°F

## 2021-04-13 DIAGNOSIS — L97519 Non-pressure chronic ulcer of other part of right foot with unspecified severity: Secondary | ICD-10-CM

## 2021-04-13 DIAGNOSIS — I2510 Atherosclerotic heart disease of native coronary artery without angina pectoris: Secondary | ICD-10-CM

## 2021-04-13 DIAGNOSIS — E11621 Type 2 diabetes mellitus with foot ulcer: Secondary | ICD-10-CM | POA: Diagnosis not present

## 2021-04-16 ENCOUNTER — Encounter: Payer: Self-pay | Admitting: Hematology and Oncology

## 2021-04-16 NOTE — Progress Notes (Signed)
Subjective: 65 year old male presents the office today for check of his left big toe.  Since he was last seen in the office he was in [place].  He did send a message had some swelling redness and he was started on doxycycline which took care of this.  He has noticed a callus becoming thicker and has a dark color.  He has not seen any drainage or pus or any swelling or redness.  He denies any fevers or chills.  His presents today with his wife.  No other concerns.  Objective: AAO x3, NAD DP/PT pulses palpable bilaterally, CRT less than 3 seconds Chronic appearing edema present bilaterally. Thickened hyperkeratotic tissue the distal aspect of the left hallux with dried blood under the callus.  After debridement there was superficial opening of the skin and I was able to remove dried blood present from of the callus.  There is no drainage or pus identified otherwise and there is no fluctuation or crepitation.  No malodor.  No significant tinea pedis present. No pain with calf compression, swelling, warmth, erythema  Assessment: 65 year old male recurrent ulceration left hallux   Plan: -All treatment options discussed with the patient including all alternatives, risks, complications.  -Sharp debrided hyperkeratotic tissue with any complications utilizing a #312 with scalpel to debride nonviable tissue and remove old dried blood present.  There is no recurrence of the wound.  Recommended to continue with Silvadene dressing changes daily as well as offloading.  He is still awaiting his diabetic shoes which I think will be more helpful long-term.  Monitoring for any signs or symptoms of infection.  Return in about 10 days (around 04/23/2021).  Trula Slade DPM

## 2021-04-17 MED ORDER — TORSEMIDE 20 MG PO TABS: 40.0000 mg | ORAL_TABLET | Freq: Two times a day (BID) | ORAL | 3 refills | Status: DC

## 2021-04-19 ENCOUNTER — Ambulatory Visit: Payer: Medicare Other | Admitting: Physician Assistant

## 2021-04-24 MED ORDER — TORSEMIDE 20 MG PO TABS: 40.0000 mg | ORAL_TABLET | Freq: Every day | ORAL | 3 refills | Status: DC

## 2021-04-24 NOTE — Addendum Note (Signed)
Addended by: Precious Gilding on: 04/24/2021 11:48 AM   Modules accepted: Orders

## 2021-04-25 ENCOUNTER — Encounter: Payer: Self-pay | Admitting: Hematology and Oncology

## 2021-04-25 ENCOUNTER — Inpatient Hospital Stay (HOSPITAL_BASED_OUTPATIENT_CLINIC_OR_DEPARTMENT_OTHER): Payer: Medicare Other | Admitting: Hematology and Oncology

## 2021-04-25 ENCOUNTER — Other Ambulatory Visit: Payer: Self-pay

## 2021-04-25 ENCOUNTER — Other Ambulatory Visit: Payer: Self-pay | Admitting: Hematology and Oncology

## 2021-04-25 ENCOUNTER — Ambulatory Visit (INDEPENDENT_AMBULATORY_CARE_PROVIDER_SITE_OTHER): Payer: Medicare Other | Admitting: Podiatry

## 2021-04-25 ENCOUNTER — Inpatient Hospital Stay: Payer: Medicare Other | Attending: Hematology and Oncology

## 2021-04-25 VITALS — BP 102/64 | HR 79 | Temp 97.7°F | Resp 17 | Wt 300.4 lb

## 2021-04-25 DIAGNOSIS — Z86711 Personal history of pulmonary embolism: Secondary | ICD-10-CM

## 2021-04-25 DIAGNOSIS — L97519 Non-pressure chronic ulcer of other part of right foot with unspecified severity: Secondary | ICD-10-CM

## 2021-04-25 DIAGNOSIS — E11621 Type 2 diabetes mellitus with foot ulcer: Secondary | ICD-10-CM | POA: Diagnosis not present

## 2021-04-25 DIAGNOSIS — I2699 Other pulmonary embolism without acute cor pulmonale: Secondary | ICD-10-CM | POA: Insufficient documentation

## 2021-04-25 DIAGNOSIS — E1149 Type 2 diabetes mellitus with other diabetic neurological complication: Secondary | ICD-10-CM

## 2021-04-25 DIAGNOSIS — I2782 Chronic pulmonary embolism: Secondary | ICD-10-CM | POA: Diagnosis not present

## 2021-04-25 DIAGNOSIS — Z7901 Long term (current) use of anticoagulants: Secondary | ICD-10-CM | POA: Diagnosis not present

## 2021-04-25 DIAGNOSIS — B351 Tinea unguium: Secondary | ICD-10-CM | POA: Diagnosis not present

## 2021-04-25 DIAGNOSIS — I2510 Atherosclerotic heart disease of native coronary artery without angina pectoris: Secondary | ICD-10-CM | POA: Diagnosis not present

## 2021-04-25 LAB — CBC WITH DIFFERENTIAL (CANCER CENTER ONLY)
Abs Immature Granulocytes: 0.01 10*3/uL (ref 0.00–0.07)
Basophils Absolute: 0 10*3/uL (ref 0.0–0.1)
Basophils Relative: 1 %
Eosinophils Absolute: 0.2 10*3/uL (ref 0.0–0.5)
Eosinophils Relative: 3 %
HCT: 43.8 % (ref 39.0–52.0)
Hemoglobin: 15.3 g/dL (ref 13.0–17.0)
Immature Granulocytes: 0 %
Lymphocytes Relative: 35 %
Lymphs Abs: 2.3 10*3/uL (ref 0.7–4.0)
MCH: 32.1 pg (ref 26.0–34.0)
MCHC: 34.9 g/dL (ref 30.0–36.0)
MCV: 91.8 fL (ref 80.0–100.0)
Monocytes Absolute: 0.4 10*3/uL (ref 0.1–1.0)
Monocytes Relative: 7 %
Neutro Abs: 3.6 10*3/uL (ref 1.7–7.7)
Neutrophils Relative %: 54 %
Platelet Count: 195 10*3/uL (ref 150–400)
RBC: 4.77 MIL/uL (ref 4.22–5.81)
RDW: 13.1 % (ref 11.5–15.5)
WBC Count: 6.5 10*3/uL (ref 4.0–10.5)
nRBC: 0 % (ref 0.0–0.2)

## 2021-04-25 LAB — CMP (CANCER CENTER ONLY)
ALT: 41 U/L (ref 0–44)
AST: 40 U/L (ref 15–41)
Albumin: 3.9 g/dL (ref 3.5–5.0)
Alkaline Phosphatase: 69 U/L (ref 38–126)
Anion gap: 12 (ref 5–15)
BUN: 14 mg/dL (ref 8–23)
CO2: 28 mmol/L (ref 22–32)
Calcium: 8.6 mg/dL — ABNORMAL LOW (ref 8.9–10.3)
Chloride: 97 mmol/L — ABNORMAL LOW (ref 98–111)
Creatinine: 1.41 mg/dL — ABNORMAL HIGH (ref 0.61–1.24)
GFR, Estimated: 55 mL/min — ABNORMAL LOW (ref >60)
Glucose, Bld: 202 mg/dL — ABNORMAL HIGH (ref 70–99)
Potassium: 3.5 mmol/L (ref 3.5–5.1)
Sodium: 137 mmol/L (ref 135–145)
Total Bilirubin: 0.8 mg/dL (ref 0.3–1.2)
Total Protein: 6.8 g/dL (ref 6.5–8.1)

## 2021-04-25 MED ORDER — DOXYCYCLINE HYCLATE 100 MG PO TABS: 100.0000 mg | ORAL_TABLET | Freq: Two times a day (BID) | ORAL | 0 refills | Status: DC

## 2021-04-25 NOTE — Progress Notes (Signed)
[place] [phone]) [phone]   Fax:([phone]) [phone]  PROGRESS NOTE  Patient Care Team: [name], MD as PCP - General (Family Medicine) Werner Lean, MD as PCP - Cardiology (Cardiology) Belva Crome, MD as Consulting Physician (Cardiology) Milus Banister, MD as Attending Physician (Gastroenterology) Brand Males, MD as Consulting Physician (Pulmonary Disease) Druscilla Brownie, MD as Consulting Physician (Dermatology) Mosetta Anis, MD as Referring Physician (Allergy) Rexene Agent, MD as Attending Physician (Nephrology) Thornell Sartorius, MD as Consulting Physician (Otolaryngology) Christain [place], [place] as Referring Physician (Optometry) Ulice Brilliant, MD as Referring Physician (Internal Medicine)  Hematological/Oncological History # Unprovoked RLL Segmental Pulmonary Embolism 12/16/2020: CT Angio chest showed a segmental pulmonary thromboembolism in the right lower lobe. No evidence of right heart strain. Started on Xarelto 20mg  PO daily.  02/16/2021: establish care with Dr. Lorenso Courier   Interval History:  [name] 65 y.o. male with medical history significant for unprovoked pulmonary embolism who presents for a follow up visit. The patient's last visit was on 02/16/2021. In the interim since the last visit he has continued on Xarelto therapy.   On exam today [name] reports that he has been tolerating Xarelto therapy well.  He notes that when he gets a cut or scrape it does bleed quite well.  He notes that he does have a toe infection which is also been a source of bleeding.  Otherwise he denies any nosebleeds, gum bleeds, dark stools, or other overt signs of bleeding.  He was recently switched from Lasix to torsemide.  He does have baseline shortness of breath which has not recently worsened.  He denies any chest pain.  He reports no fevers, chills, sweats, nausea, vomiting or diarrhea.  Full 10 point ROS is listed below.  MEDICAL  HISTORY:  Past Medical History:  Diagnosis Date   Abdominal aortic aneurysm (AAA), 30-34 mm diameter 06/01/2017   Nml w/ greatest dm 3 cm US Aorta 06/2016, Abd [place] 05/01/2017 showed infrarenal dilatation at 3.4 cm - rec repeat US in 3 yrs.   Arthritis    Cervical disc disease 12/26/2011   Coronary artery disease involving native coronary artery of native heart without angina pectoris 12/01/2016   He had coronary angiography in 2011. There was irregularity within the LAD with up to 30% narrowing. The myocardial perfusion imaging done 11/07/2015 did not demonstrate any evidence of ischemia with a low risk nuclear stress test other than EF estimated at 47%. Chest CT 01/25/2016 showed diffuse coronary artery calcifications with heavy calcifications in the LAD. Pt seen by cardiology Dr. Linard Millers    Diabetes mellitus    Difficult intubation 04/05/2014   Dyslipidemia    Family history of anesthesia complication    pt states took days for his father to awaken after mask was used    Fatty liver 06/01/2017   [place] 05/01/2017   GERD (gastroesophageal reflux disease)    has been having acid reflux since recent endoscopy    H/O hiatal hernia    repair with lap band, now has ventral hernia midline abd   Hearing loss-aides 03/26/2012   Hyperlipidemia    Hypertension    Morbid obesity ([place])    OSA (obstructive sleep apnea)    Rotator cuff tear    Seasonal allergies    Shortness of breath    pt states related to high BP meds with extended walking or climbling stairs   Sleep apnea     SURGICAL HISTORY: Past  Surgical History:  Procedure Laterality Date   CARDIAC CATHETERIZATION     approx 3 years ago    COLONOSCOPY WITH PROPOFOL N/A 03/10/2015   Procedure: COLONOSCOPY WITH PROPOFOL;  Surgeon: Milus Banister, MD;  Location: [facility];  Service: Endoscopy;  Laterality: N/A;   colonscopy      2012   ESOPHAGOGASTRODUODENOSCOPY (EGD) WITH PROPOFOL N/A 03/10/2015   Procedure: ESOPHAGOGASTRODUODENOSCOPY  (EGD) WITH PROPOFOL;  Surgeon: Milus Banister, MD;  Location: [facility];  Service: Endoscopy;  Laterality: N/A;   HERNIA REPAIR     with gastric banding   LAPAROSCOPIC GASTRIC BANDING N/A 04/05/2014   Procedure: LAPAROSCOPIC GASTRIC BANDING;  Surgeon: Pedro Earls, MD;  Location: [facility];  Service: General;  Laterality: N/A;   MENISCUS REPAIR Left    left knee torn meniscus   VASECTOMY      SOCIAL HISTORY: Social History   Socioeconomic History   Marital status: Married    Spouse name: Not on file   Number of children: 2   Years of education: Not on file   Highest education level: Not on file  Occupational History   Occupation: owner    Comment: [employer]  Tobacco Use   Smoking status: Former    Packs/day: 4.00    Years: 20.00    Pack years: 80.00    Types: Cigarettes    Start date: 07/19/1973    Quit date: 12/13/1991    Years since quitting: 29.3   Smokeless tobacco: Never  Vaping Use   Vaping Use: Never used  Substance and Sexual Activity   Alcohol use: Yes    Alcohol/week: 3.0 - 5.0 standard drinks    Types: 3 - 5 Glasses of wine per week    Comment: takes 3 to 5 glasses of wine nightly    Drug use: No   Sexual activity: Not on file  Other Topics Concern   Not on file  Social History Narrative   Not on file   Social Determinants of Health   Financial Resource Strain: Not on file  Food Insecurity: Not on file  Transportation Needs: Not on file  Physical Activity: Not on file  Stress: Not on file  Social Connections: Not on file  Intimate Partner Violence: Not on file    FAMILY HISTORY: Family History  Problem Relation Age of Onset   Clotting disorder Mother        lung clot   Mesothelioma Father    Hypertension Brother    Skin cancer Brother    Colon cancer Neg Hx     ALLERGIES:  is allergic to farxiga [dapagliflozin], erythromycin, penicillins, and quercus robur.  MEDICATIONS:  Current Outpatient Medications  Medication Sig  Dispense Refill   diltiazem (CARDIZEM CD) 300 MG 24 hr capsule Take 300 mg by mouth daily.     doxycycline (VIBRA-TABS) 100 MG tablet Take 1 tablet (100 mg total) by mouth 2 (two) times daily. 20 tablet 0   fluticasone (FLONASE) 50 MCG/ACT nasal spray Place 2 sprays into both nostrils daily. 16 g 2   glucose blood (TRUETEST TEST) test strip Use as instructed 100 each 2   insulin degludec (TRESIBA FLEXTOUCH) 200 UNIT/ML FlexTouch Pen Inject 114 Units into the skin daily.     Insulin Pen Needle (B-D UF III MINI PEN NEEDLES) 31G X 5 MM MISC USE AS DIRECTED ONCE A DAY 100 each 11   lisinopril (ZESTRIL) 40 MG tablet daily.     Multiple Vitamin (MULTIVITAMIN) tablet  Take 1 tablet by mouth every morning.      NEOMYCIN-POLYMYXIN-HYDROCORTISONE (CORTISPORIN) 1 % SOLN OTIC solution as needed.     Potassium Chloride ER 20 MEQ TBCR Take 40 mEq by mouth daily.     rosuvastatin (CRESTOR) 20 MG tablet Take 1 tablet (20 mg total) by mouth daily. 90 tablet 1   silver sulfADIAZINE (SILVADENE) 1 % cream Apply 1 application topically daily. 50 g 0   tamsulosin (FLOMAX) 0.4 MG CAPS capsule Take 1 capsule (0.4 mg total) by mouth daily after supper. 90 capsule 0   torsemide (DEMADEX) 20 MG tablet Take 2 tablets (40 mg total) by mouth daily. 180 tablet 3   XARELTO 20 MG TABS tablet Take 20 mg by mouth daily.     Ciclopirox 0.77 % gel Apply 1 application topically 2 (two) times daily. (Patient not taking: Reported on 04/25/2021) 45 g 0   fluticasone-salmeterol (ADVAIR HFA) 115-21 MCG/ACT inhaler Inhale 2 puffs into the lungs 2 (two) times daily. (Patient not taking: No sig reported) 1 each 3   linezolid (ZYVOX) 600 MG tablet Take 1 tablet (600 mg total) by mouth 2 (two) times daily. (Patient not taking: Reported on 04/25/2021) 20 tablet 0   pantoprazole (PROTONIX) 40 MG tablet Take 1 tablet (40 mg total) by mouth daily. (Patient not taking: Reported on 04/25/2021) 30 tablet 0   No current facility-administered medications  for this visit.    REVIEW OF SYSTEMS:   Constitutional: ( - ) fevers, ( - )  chills , ( - ) night sweats Eyes: ( - ) blurriness of vision, ( - ) double vision, ( - ) watery eyes Ears, nose, mouth, throat, and face: ( - ) mucositis, ( - ) sore throat Respiratory: ( - ) cough, ( - ) dyspnea, ( - ) wheezes Cardiovascular: ( - ) palpitation, ( - ) chest discomfort, ( - ) lower extremity swelling Gastrointestinal:  ( - ) nausea, ( - ) heartburn, ( - ) change in bowel habits Skin: ( - ) abnormal skin rashes Lymphatics: ( - ) new lymphadenopathy, ( - ) easy bruising Neurological: ( - ) numbness, ( - ) tingling, ( - ) new weaknesses Behavioral/Psych: ( - ) mood change, ( - ) new changes  All other systems were reviewed with the patient and are negative.  PHYSICAL EXAMINATION:  Vitals:   04/25/21 1010  BP: 102/64  Pulse: 79  Resp: 17  Temp: 97.7 F (36.5 C)  SpO2: 98%   Filed Weights   04/25/21 1010  Weight: (!) 300 lb 6.4 oz (136.3 kg)    GENERAL: well appearing elderly Caucasian male, in NAD  SKIN: skin color, texture, turgor are normal, no rashes or significant lesions EYES: conjunctiva are pink and non-injected, sclera clear LUNGS: clear to auscultation and percussion with normal breathing effort HEART: regular rate & rhythm and no murmurs and no lower extremity edema Musculoskeletal: no cyanosis of digits and no clubbing  PSYCH: alert & oriented x 3, fluent speech NEURO: no focal motor/sensory deficits    LABORATORY DATA:  I have reviewed the data as listed CBC Latest Ref Rng & Units 04/25/2021 02/28/2021 02/16/2021  WBC 4.0 - 10.5 K/uL 6.5 4.3 5.1  Hemoglobin 13.0 - 17.0 g/dL 15.3 14.6 14.8  Hematocrit 39.0 - 52.0 % 43.8 43.0 43.1  Platelets 150 - 400 K/uL 195 121(L) 192    CMP Latest Ref Rng & Units 04/25/2021 02/16/2021 02/15/2021  Glucose 70 - 99 mg/dL 202(H) 163(H) 195(H)  BUN 8 - 23 mg/dL 14 11 10   Creatinine 0.61 - 1.24 mg/dL 1.41(H) 1.28(H) 1.17  Sodium 135 - 145  mmol/L 137 141 139  Potassium 3.5 - 5.1 mmol/L 3.5 4.1 3.9  Chloride 98 - 111 mmol/L 97(L) 104 99  CO2 22 - 32 mmol/L 28 26 24   Calcium 8.9 - 10.3 mg/dL 8.6(L) 8.9 8.5(L)  Total Protein 6.5 - 8.1 g/dL 6.8 6.8 -  Total Bilirubin 0.3 - 1.2 mg/dL 0.8 0.4 -  Alkaline Phos 38 - 126 U/L 69 62 -  AST 15 - 41 U/L 40 27 -  ALT 0 - 44 U/L 41 26 -    No results found for: MPROTEIN  RADIOGRAPHIC STUDIES: No results found.  ASSESSMENT & PLAN [name] 65 y.o. male with medical history significant for unprovoked pulmonary embolism who presents for a follow up visit.   After review of the labs, review of the records, and discussion with the patient the patients findings are most consistent with incidental finding of a segmental pulmonary thromboembolism in the right lower lobe.  The patient does travel a great deal and he is overweight which may be contributing to his blood clot, however these are not necessarily modifiable risk factors.  Given the incidental nature of this clot is unclear if this represents a provoked or unprovoked VTE.  In the interest of being cautious I would recommend we treat this as an unprovoked.  # Unprovoked RLL Segmental Pulmonary Embolism -- Findings are consistent with an unprovoked right lower lobe segmental pulmonary embolism --Given this finding would recommend indefinite anticoagulation --Continue treatment with Xarelto 20 mg p.o. daily.  We will continue this therapy for 6 months time and then reevaluate and consider decreasing down to maintenance dose --Labs are currently appropriate for continuation of anticoagulation therapy.  Hemoglobin 15.3, creatinine 1.41, and AST 40 ALT 41. --Return to clinic in 3 months time to discuss transition to maintenance dose Xarelto.     No orders of the defined types were placed in this encounter.   All questions were answered. The patient knows to call the clinic with any problems, questions or concerns.  A total of  more than 30 minutes were spent on this encounter with face-to-face time and non-face-to-face time, including preparing to see the patient, ordering tests and/or medications, counseling the patient and coordination of care as outlined above.   Ledell Peoples, MD Department of Hematology/Oncology [place] [facility] Phone: [phone] Pager: [phone] Email: [email or web address]  04/25/2021 10:55 AM

## 2021-04-25 NOTE — Progress Notes (Signed)
Cardiology Office Note:    Date:  04/26/2021   ID:  [name], DOB [birth year], MRN [id number]  PCP:  [name], MD   [facility] Providers Cardiologist:  Werner Lean, MD     Referring MD: [name], MD   CC: DOD SOB  History of Present Illness:    [name] is a 65 y.o. male with a hx of mild non obstructive CAD, AAA,Morbid Obesity, DM, and HTN; OSA on CPAP COVID-19 2021 who presents for evaluation 10/21/20.  In interim of this visit, patient started on lasix with some improved symptoms.  Echo pending.  Seen 02/01/21  In interim of this visit, patient had worsening breathing and we transitioned to torsemide.  Seen as DOD visit 04/26/21.  Patient notes that he is doing better:  he was talking torsemide 40 mg PO BID and had diarrhea from his antibiotics: with this fel dizzy and weak.  The diarrhea is persisting but with torsemide 40 mg dizziness has resolved, leg swelling has improved but not resolved.  His hematologist is getting DOAC changed. His K 04/25/21 is 3.5 and he has never had hyperkalemia (he is on 40 mg PO daily).   No chest pain or pressure .  SOB has slightly imrproved and no PND/Orthopnea.  No weight gain.  No palpitations or syncope.  He is about to go to [place] and [place] and wants to make sure he is well set prior to    Past Medical History:  Diagnosis Date   Abdominal aortic aneurysm (AAA), 30-34 mm diameter 06/01/2017   Nml w/ greatest dm 3 cm US Aorta 06/2016, Abd [place] 05/01/2017 showed infrarenal dilatation at 3.4 cm - rec repeat US in 3 yrs.   Arthritis    Cervical disc disease 12/26/2011   Coronary artery disease involving native coronary artery of native heart without angina pectoris 12/01/2016   He had coronary angiography in 2011. There was irregularity within the LAD with up to 30% narrowing. The myocardial perfusion imaging done 11/07/2015 did not demonstrate any evidence of ischemia with a low risk nuclear stress test  other than EF estimated at 47%. Chest CT 01/25/2016 showed diffuse coronary artery calcifications with heavy calcifications in the LAD. Pt seen by cardiology Dr. Linard Millers    Diabetes mellitus    Difficult intubation 04/05/2014   Dyslipidemia    Family history of anesthesia complication    pt states took days for his father to awaken after mask was used    Fatty liver 06/01/2017   [place] 05/01/2017   GERD (gastroesophageal reflux disease)    has been having acid reflux since recent endoscopy    H/O hiatal hernia    repair with lap band, now has ventral hernia midline abd   Hearing loss-aides 03/26/2012   Hyperlipidemia    Hypertension    Morbid obesity ([place])    OSA (obstructive sleep apnea)    Rotator cuff tear    Seasonal allergies    Shortness of breath    pt states related to high BP meds with extended walking or climbling stairs   Sleep apnea     Past Surgical History:  Procedure Laterality Date   CARDIAC CATHETERIZATION     approx 3 years ago    COLONOSCOPY WITH PROPOFOL N/A 03/10/2015   Procedure: COLONOSCOPY WITH PROPOFOL;  Surgeon: Milus Banister, MD;  Location: [facility];  Service: Endoscopy;  Laterality: N/A;   colonscopy      2012  ESOPHAGOGASTRODUODENOSCOPY (EGD) WITH PROPOFOL N/A 03/10/2015   Procedure: ESOPHAGOGASTRODUODENOSCOPY (EGD) WITH PROPOFOL;  Surgeon: Milus Banister, MD;  Location: [facility];  Service: Endoscopy;  Laterality: N/A;   HERNIA REPAIR     with gastric banding   LAPAROSCOPIC GASTRIC BANDING N/A 04/05/2014   Procedure: LAPAROSCOPIC GASTRIC BANDING;  Surgeon: Pedro Earls, MD;  Location: [facility];  Service: General;  Laterality: N/A;   MENISCUS REPAIR Left    left knee torn meniscus   VASECTOMY      Current Medications: Current Meds  Medication Sig   Ciclopirox 0.77 % gel Apply 1 application topically 2 (two) times daily. (Patient taking differently: Apply 1 application topically as needed (athlete foot).)   fluticasone-salmeterol (ADVAIR  HFA) 115-21 MCG/ACT inhaler Inhale 2 puffs into the lungs 2 (two) times daily. (Patient taking differently: Inhale 2 puffs into the lungs as needed (sob).)   pantoprazole (PROTONIX) 40 MG tablet Take 40 mg by mouth as needed.   [DISCONTINUED] pantoprazole (PROTONIX) 40 MG tablet Take 1 tablet (40 mg total) by mouth daily. (Patient taking differently: Take 40 mg by mouth as needed.)     Allergies:   Farxiga [dapagliflozin], Erythromycin, Penicillins, and Quercus robur   Social History   Socioeconomic History   Marital status: Married    Spouse name: Not on file   Number of children: 2   Years of education: Not on file   Highest education level: Not on file  Occupational History   Occupation: owner    Comment: [employer]  Tobacco Use   Smoking status: Former    Packs/day: 4.00    Years: 20.00    Pack years: 80.00    Types: Cigarettes    Start date: 07/19/1973    Quit date: 12/13/1991    Years since quitting: 29.3   Smokeless tobacco: Never  Vaping Use   Vaping Use: Never used  Substance and Sexual Activity   Alcohol use: Yes    Alcohol/week: 3.0 - 5.0 standard drinks    Types: 3 - 5 Glasses of wine per week    Comment: takes 3 to 5 glasses of wine nightly    Drug use: No   Sexual activity: Not on file  Other Topics Concern   Not on file  Social History Narrative   Not on file   Social Determinants of Health   Financial Resource Strain: Not on file  Food Insecurity: Not on file  Transportation Needs: Not on file  Physical Activity: Not on file  Stress: Not on file  Social Connections: Not on file    Social: I took care of his wife Margarita Grizzle as well; 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 There is no history of Colon cancer. History of coronary artery disease notable for  no members. History of heart failure notable for no members. History of arrhythmia notable for no members.  ROS:   Please see the history of present illness.     All other systems reviewed and are negative.  EKGs/Labs/Other Studies Reviewed:    The following studies were reviewed today:  EKG:   10/21/20: Sinus bradycardia rate 55 1st HB  NonCardiac CT: Date: 01/25/2016 Results: Aortic Atherosclerosis 3VD Calcification with LAD predominance  OSH CTPE  DATE 12/16/20 The ascending thoracic aorta measures 3.6 x 3.5 cm  in transverse by AP dimension. The descending thoracic aorta measures 2.6 x 2.6 cm in transverse by AP dimension. There is atherosclerotic disease of the thoracic aorta and coronary arteries without evidence of focal intimal irregularity.   There is a central pulmonary arterial filling defect in the right lower lobe segmental pulmonary artery (series 4, image 43). No CT evidence of right heart strain. No pericardial effusion or bulky mediastinal adenopathy.   No focal consolidation. No pleural effusion or pneumothorax.   Gastric band is present. There is a cyst dorsally seen in the upper pole the right kidney. The visualized portions of the upper abdomen are otherwiseunremarkable.   Transthoracic Echocardiogram: Date: 02/03/21 Results:  1. Left ventricular ejection fraction, by estimation, is 60 to 65%. The  left ventricle has normal function. The left ventricle has no regional  wall motion abnormalities. There is moderate left ventricular hypertrophy.  Left ventricular diastolic  parameters are consistent with Grade I diastolic dysfunction (impaired  relaxation).   2. Right ventricular systolic function is normal. The right ventricular  size is normal.   3. The mitral valve is abnormal. Trivial mitral valve regurgitation.  Moderate mitral annular calcification.   4. The aortic valve is tricuspid. Aortic valve regurgitation is not  visualized.   NM Stress Testing  : Date: 11/08/2015 Results: Nuclear stress EF: 47%. The left ventricular ejection fraction is mildly decreased (45-54%). There was no ST segment deviation noted during stress. This is a low risk study. There is no evidence of ischemia or previous infarction  Abdominal Aortic Duplex Date: 04/17/2019 Abdominal Aorta: There is evidence of abnormal dilatation of the distal  Abdominal aorta. The largest aortic measurement is 2.9 cm. The largest  aortic diameter remains essentially unchanged compared to prior exam.  Previous diameter measurement was 3.0 cm  obtained on 06/2016.   Left/Right Heart Catheterizations: [id number] Results: Mild obstructive only per chart review.   Recent Labs: 10/21/2020: NT-Pro BNP 18 02/01/2021: Magnesium 2.0 04/25/2021: ALT 41; BUN 14; Creatinine 1.41; Hemoglobin 15.3; Platelet Count 195; Potassium 3.5; Sodium 137  Recent Lipid Panel    Component Value Date/Time   CHOL 147 08/15/2018 1001   TRIG 194 (H) 08/15/2018 1001   HDL 54 08/15/2018 1001   CHOLHDL 2.7 08/15/2018 1001   CHOLHDL 5.2 (H) 09/07/2015 1802   VLDL NOT CALC 09/07/2015 1802   LDLCALC 54 08/15/2018 1001   Physical Exam:    VS:  BP 134/70   Pulse 76   Ht 6' (1.829 m)   Wt (!) 301 lb (136.5 kg)   SpO2 97%   BMI 40.82 kg/m     Wt Readings from Last 3 Encounters:  04/26/21 (!) 301 lb (136.5 kg)  04/25/21 (!) 300 lb 6.4 oz (136.3 kg)  02/16/21 (!) 304 lb 14.4 oz (138.3 kg)    Gen: No distress, Morbid obese Neck: No JVD, no carotid bruit Cardiac: No Rubs or Gallops, No Murmur, normal rhythm, +2 radial pulses Respiratory: Clear to auscultation bilaterally, normal effort, normal  respiratory rate GI: Soft, nontender, non-distended  MS: +2 edema;  moves all extremities Integument: Skin feels warm Neuro:  At time of evaluation, alert and oriented to person/place/time/situation  Psych: Normal affect, patient feels    ASSESSMENT:    1. Heart failure, type unknown ([place])   2. Aortic  atherosclerosis (HCC)   3. Abdominal aortic aneurysm (AAA), 30-34 mm diameter     PLAN:    In order of problems listed above:  HFpEF CAD; mild non  obstructive in 2011; LAD disease, Asymptomatic Aortic atherosclerosis AAA - 30 mm (vs ULN)- consider imaging in the future visit Morbid Obesity, DM, and HTN;  OSA on CPAP Subacute PE (unclear provocation) - NYHA class III, Stage C, hypervolemic, etiology is multifactorial - Diuretic regimen: torsemide 40 mg PO daily  - will increase K of to 60 mg Eq-> he has never had hyperkalemia and is still on diuretics - Discussed the importance of fluid restriction of < 2 L, salt restriction, and checking daily weights, discussed wine intake - diltiazem 300 mg PO daily - on Xarelto per oncology - continue statin  Will change 11/18 appt to January 2023  Medication Adjustments/Labs and Tests Ordered: Current medicines are reviewed at length with the patient today.  Concerns regarding medicines are outlined above.  No orders of the defined types were placed in this encounter.   Meds ordered this encounter  Medications   Potassium Chloride ER 20 MEQ TBCR    Sig: Take 60 mEq by mouth daily.    Dispense:  180 tablet    Refill:  3     Patient Instructions  Medication Instructions:  Your physician has recommended you make the following change in your medication:  INCREASE: Potassium to 60 mEq by mouth daily *If you need a refill on your cardiac medications before your next appointment, please call your pharmacy*   Lab Work: NONE If you have labs (blood work) drawn today and your tests are completely normal, you will receive your results only by: [place] (if you have MyChart) OR A paper copy in the mail If you have any lab test that is abnormal or we need to change your treatment, we will call you to review the results.   Testing/Procedures: NONE   Follow-Up: At [facility], you and your health needs are our priority.  As  part of our continuing mission to provide you with exceptional heart care, we have created designated Provider Care Teams.  These Care Teams include your primary Cardiologist (physician) and Advanced Practice Providers (APPs -  Physician Assistants and Nurse Practitioners) who all work together to provide you with the care you need, when you need it.  We recommend signing up for the patient portal called "MyChart".  Sign up information is provided on this After Visit Summary.  MyChart is used to connect with patients for Virtual Visits (Telemedicine).  Patients are able to view lab/test results, encounter notes, upcoming appointments, etc.  Non-urgent messages can be sent to your provider as well.   To learn more about what you can do with MyChart, go to [email or web address].    Your next appointment:   2 month(s)  The format for your next appointment:   In Person  Provider:   Werner Lean, MD {     Signed, Werner Lean, MD  04/26/2021 3:17 PM    [place]

## 2021-04-26 ENCOUNTER — Ambulatory Visit (INDEPENDENT_AMBULATORY_CARE_PROVIDER_SITE_OTHER): Payer: Medicare Other | Admitting: Internal Medicine

## 2021-04-26 ENCOUNTER — Encounter: Payer: Self-pay | Admitting: Internal Medicine

## 2021-04-26 ENCOUNTER — Encounter: Payer: Self-pay | Admitting: Hematology and Oncology

## 2021-04-26 VITALS — BP 134/70 | HR 76 | Ht 72.0 in | Wt 301.0 lb

## 2021-04-26 DIAGNOSIS — I509 Heart failure, unspecified: Secondary | ICD-10-CM | POA: Diagnosis not present

## 2021-04-26 DIAGNOSIS — I714 Abdominal aortic aneurysm, without rupture, unspecified: Secondary | ICD-10-CM

## 2021-04-26 DIAGNOSIS — I700 Atherosclerosis of aorta: Secondary | ICD-10-CM | POA: Diagnosis not present

## 2021-04-26 MED ORDER — POTASSIUM CHLORIDE ER 20 MEQ PO TBCR: 60.0000 meq | EXTENDED_RELEASE_TABLET | Freq: Every day | ORAL | 3 refills | Status: DC

## 2021-04-26 NOTE — Patient Instructions (Signed)
Medication Instructions:  Your physician has recommended you make the following change in your medication:  INCREASE: Potassium to 60 mEq by mouth daily *If you need a refill on your cardiac medications before your next appointment, please call your pharmacy*   Lab Work: NONE If you have labs (blood work) drawn today and your tests are completely normal, you will receive your results only by: [place] (if you have MyChart) OR A paper copy in the mail If you have any lab test that is abnormal or we need to change your treatment, we will call you to review the results.   Testing/Procedures: NONE   Follow-Up: At [facility], you and your health needs are our priority.  As part of our continuing mission to provide you with exceptional heart care, we have created designated Provider Care Teams.  These Care Teams include your primary Cardiologist (physician) and Advanced Practice Providers (APPs -  Physician Assistants and Nurse Practitioners) who all work together to provide you with the care you need, when you need it.  We recommend signing up for the patient portal called "MyChart".  Sign up information is provided on this After Visit Summary.  MyChart is used to connect with patients for Virtual Visits (Telemedicine).  Patients are able to view lab/test results, encounter notes, upcoming appointments, etc.  Non-urgent messages can be sent to your provider as well.   To learn more about what you can do with MyChart, go to [email or web address].    Your next appointment:   2 month(s)  The format for your next appointment:   In Person  Provider:   Werner Lean, MD {

## 2021-04-28 NOTE — Progress Notes (Signed)
Subjective: 65 year old male presents the office today for check of his left big toe.  He still wearing his diabetic shoes.  Is been wearing the surgical shoe with a pegassist for offloading but has broken down again.  He is recently started doxycycline for another issue outside of his foot.  He is not going to [place] and have the country next week will not be back for couple months.  He currently denies any fevers or chills.  No nausea or vomiting.  No other concerns.   Objective: AAO x3, NAD DP/PT pulses palpable bilaterally, CRT less than 3 seconds Chronic appearing edema present bilaterally. Noted to be hypertrophic and dystrophic with yellow-brown discoloration.  No edema, erythema to the toenail sites. Thickened hyperkeratotic tissue the distal aspect of the left hallux with dried blood under the callus.  After debridement there is a superficial wound measuring 0.3 x 0.3 x 0.1 cm without any probing, undermining or tunneling.  No surrounding erythema, ascending cellulitis but no fluctuance crepitation.  No malodor.  No pain with calf compression, swelling, warmth, erythema  Assessment: 65 year old male recurrent ulceration left hallux   Plan: -All treatment options discussed with the patient including all alternatives, risks, complications.  -Sharp debrided hyperkeratotic tissue with any complications utilizing a #312 with scalpel to debride nonviable tissue and remove old dried blood present.  There is a superficial area of skin breakdown.  No blood loss during the procedure today.  He tolerated procedure well.  Recommend continue with Silvadene dressing changes daily.  Continue offloading.  A new pegassist was dispensed today for surgical shoe.  There is elevation.  Needs to help control the edema.  As he is going out of the country and that at home for several weeks and will prescribe doxycycline for him to take with him only in the event that he needs it.  Monitoring for any signs or  symptoms of infection. -He has had nail fungus we discussed treatment options for this.  Insert over-the-counter medication for now.  Trula Slade DPM

## 2021-05-01 NOTE — Telephone Encounter (Signed)
Please advise 

## 2021-05-02 ENCOUNTER — Ambulatory Visit (INDEPENDENT_AMBULATORY_CARE_PROVIDER_SITE_OTHER): Payer: Medicare Other | Admitting: Podiatry

## 2021-05-02 ENCOUNTER — Other Ambulatory Visit: Payer: Self-pay

## 2021-05-02 DIAGNOSIS — I2510 Atherosclerotic heart disease of native coronary artery without angina pectoris: Secondary | ICD-10-CM | POA: Diagnosis not present

## 2021-05-02 DIAGNOSIS — L97519 Non-pressure chronic ulcer of other part of right foot with unspecified severity: Secondary | ICD-10-CM | POA: Diagnosis not present

## 2021-05-02 DIAGNOSIS — E11621 Type 2 diabetes mellitus with foot ulcer: Secondary | ICD-10-CM

## 2021-05-02 DIAGNOSIS — E1149 Type 2 diabetes mellitus with other diabetic neurological complication: Secondary | ICD-10-CM

## 2021-05-05 ENCOUNTER — Ambulatory Visit: Payer: Medicare Other | Admitting: Internal Medicine

## 2021-05-09 NOTE — Progress Notes (Signed)
Subjective: 65 year old male presents the office today for check of his left big toe.  His wife accompanies him to the appointment.  They do think it is getting better.  He finished a course of doxycycline which she was on for an unrelated issue.   He denies any drainage or pus or any increase in swelling.  No redness.  He has no fevers or chills.  No other concerns today.  Objective: AAO x3, NAD DP/PT pulses palpable bilaterally, CRT less than 3 seconds Chronic appearing edema present bilaterally. Noted to be hypertrophic and dystrophic with yellow-brown discoloration.  No edema, erythema to the toenail sites. Thickened hyperkeratotic tissue the distal aspect of the left hallux with dried blood under the callus.  After debridement of callus today small pinpoint opening is still present but appears to be improved.  There is no surrounding erythema, ascending cellulitis.  No drainage or pus.  No fluctuation or crepitation.  There is no malodor. No pain with calf compression, swelling, warmth, erythema  Assessment: 65 year old male recurrent ulceration left hallux   Plan: -All treatment options discussed with the patient including all alternatives, risks, complications.  -Sharply debrided hyperkeratotic tissue with any complications utilizing a #312 with scalpel to debride nonviable tissue and remove old dried blood present.  There is a superficial area of skin breakdown.  No blood loss during the procedure today.  He tolerated procedure well.  Recommend continue with Silvadene dressing changes daily.  Continue offloading.  As he is going on top of the next couple months I did give him a prescription doxycycline to take with him in case it is needed.  While he was out of town continue to offload.  Unfortunately his diabetic shoes did not arrive in time.  -Monitor for any clinical signs or symptoms of infection and directed to call the office immediately should any occur or go to the ER.  No  follow-ups on file.  Trula Slade DPM

## 2021-05-16 ENCOUNTER — Telehealth: Payer: Self-pay | Admitting: Podiatry

## 2021-05-16 NOTE — Telephone Encounter (Signed)
Diabetic shoes/inserts in..lvm for pt to call to schedule an appt to pick them up. 

## 2021-05-17 ENCOUNTER — Encounter: Payer: Self-pay | Admitting: Internal Medicine

## 2021-05-23 ENCOUNTER — Other Ambulatory Visit: Payer: Medicare Other

## 2021-05-23 ENCOUNTER — Ambulatory Visit: Payer: Medicare Other | Admitting: Hematology and Oncology

## 2021-06-14 ENCOUNTER — Encounter: Payer: Self-pay | Admitting: Podiatry

## 2021-06-22 ENCOUNTER — Ambulatory Visit: Payer: Medicare Other | Admitting: Hematology and Oncology

## 2021-06-22 ENCOUNTER — Other Ambulatory Visit: Payer: Medicare Other

## 2021-06-28 ENCOUNTER — Encounter: Payer: Self-pay | Admitting: Hematology and Oncology

## 2021-06-28 ENCOUNTER — Inpatient Hospital Stay: Payer: Medicare Other

## 2021-06-28 ENCOUNTER — Other Ambulatory Visit: Payer: Self-pay

## 2021-06-28 ENCOUNTER — Other Ambulatory Visit: Payer: Self-pay | Admitting: Hematology and Oncology

## 2021-06-28 ENCOUNTER — Inpatient Hospital Stay: Payer: Medicare Other | Attending: Hematology and Oncology | Admitting: Hematology and Oncology

## 2021-06-28 VITALS — BP 114/73 | HR 77 | Resp 18 | Wt 298.4 lb

## 2021-06-28 DIAGNOSIS — I2699 Other pulmonary embolism without acute cor pulmonale: Secondary | ICD-10-CM | POA: Diagnosis not present

## 2021-06-28 DIAGNOSIS — I2782 Chronic pulmonary embolism: Secondary | ICD-10-CM | POA: Diagnosis not present

## 2021-06-28 DIAGNOSIS — Z86711 Personal history of pulmonary embolism: Secondary | ICD-10-CM

## 2021-06-28 DIAGNOSIS — Z7901 Long term (current) use of anticoagulants: Secondary | ICD-10-CM | POA: Diagnosis not present

## 2021-06-28 LAB — CBC WITH DIFFERENTIAL (CANCER CENTER ONLY)
Abs Immature Granulocytes: 0.01 10*3/uL (ref 0.00–0.07)
Basophils Absolute: 0 10*3/uL (ref 0.0–0.1)
Basophils Relative: 0 %
Eosinophils Absolute: 0.1 10*3/uL (ref 0.0–0.5)
Eosinophils Relative: 2 %
HCT: 41.1 % (ref 39.0–52.0)
Hemoglobin: 14 g/dL (ref 13.0–17.0)
Immature Granulocytes: 0 %
Lymphocytes Relative: 31 %
Lymphs Abs: 1.7 10*3/uL (ref 0.7–4.0)
MCH: 32.4 pg (ref 26.0–34.0)
MCHC: 34.1 g/dL (ref 30.0–36.0)
MCV: 95.1 fL (ref 80.0–100.0)
Monocytes Absolute: 0.4 10*3/uL (ref 0.1–1.0)
Monocytes Relative: 7 %
Neutro Abs: 3.3 10*3/uL (ref 1.7–7.7)
Neutrophils Relative %: 60 %
Platelet Count: 170 10*3/uL (ref 150–400)
RBC: 4.32 MIL/uL (ref 4.22–5.81)
RDW: 12.7 % (ref 11.5–15.5)
WBC Count: 5.5 10*3/uL (ref 4.0–10.5)
nRBC: 0 % (ref 0.0–0.2)

## 2021-06-28 LAB — CMP (CANCER CENTER ONLY)
ALT: 31 U/L (ref 0–44)
AST: 40 U/L (ref 15–41)
Albumin: 4 g/dL (ref 3.5–5.0)
Alkaline Phosphatase: 54 U/L (ref 38–126)
Anion gap: 8 (ref 5–15)
BUN: 15 mg/dL (ref 8–23)
CO2: 31 mmol/L (ref 22–32)
Calcium: 8.6 mg/dL — ABNORMAL LOW (ref 8.9–10.3)
Chloride: 100 mmol/L (ref 98–111)
Creatinine: 1.4 mg/dL — ABNORMAL HIGH (ref 0.61–1.24)
GFR, Estimated: 55 mL/min — ABNORMAL LOW (ref >60)
Glucose, Bld: 265 mg/dL — ABNORMAL HIGH (ref 70–99)
Potassium: 3.8 mmol/L (ref 3.5–5.1)
Sodium: 139 mmol/L (ref 135–145)
Total Bilirubin: 0.7 mg/dL (ref 0.3–1.2)
Total Protein: 6.7 g/dL (ref 6.5–8.1)

## 2021-06-28 NOTE — Progress Notes (Signed)
[place] [phone]   [phone]  PROGRESS NOTE  Patient Care Team: [name], MD as PCP - General (Family Medicine) Werner Lean, MD as PCP - Cardiology (Cardiology) Belva Crome, MD as Consulting Physician (Cardiology) Milus Banister, MD as Attending Physician (Gastroenterology) Brand Males, MD as Consulting Physician (Pulmonary Disease) Druscilla Brownie, MD as Consulting Physician (Dermatology) Mosetta Anis, MD as Referring Physician (Allergy) Rexene Agent, MD as Attending Physician (Nephrology) Thornell Sartorius, MD as Consulting Physician (Otolaryngology) Christain [place], [place] as Referring Physician (Optometry) Ulice Brilliant, MD as Referring Physician (Internal Medicine)  Hematological/Oncological History # Unprovoked RLL Segmental Pulmonary Embolism 12/16/2020: CT Angio chest showed a segmental pulmonary thromboembolism in the right lower lobe. No evidence of right heart strain. Started on Xarelto 20mg  PO daily.  02/16/2021: establish care with Dr. Lorenso Courier 06/28/2021: re-evaluated to consider dosage change of Xarelto. Patient has persistent shortness of breath. Will continue Xaretlo 20mg  PO daily.    Interval History:  [name] 66 y.o. male with medical history significant for unprovoked pulmonary embolism who presents for a follow up visit. The patient's last visit was on 04/25/2021. In the interim since the last visit he has continued on Xarelto therapy without difficulty.    On exam today [name] reports that he is still "short winded".  He notes he is able to walk about 100 yards before he has to sit down.  He traveled to [place] and enjoyed a cruise over the new year but felt like he was limited due to persistent shortness of breath.  He did have to use a wheelchair several times.  He notes that he does not have any cough or chest pain.  He denies any nosebleeding or gum bleeding.  He did have a  callus buildup on his right great toe.  He will be seeing podiatry in order to evaluate this tomorrow.  Overall he has mild prolonged bleeding when provoked but does not have any spontaneous bleeding.  He denies any chest pain.  He reports no fevers, chills, sweats, nausea, vomiting or diarrhea.  Full 10 point ROS is listed below.  Today we discussed the options moving forward and the patient was agreeable to continuing full dose Xarelto 20 mg p.o. daily.  MEDICAL HISTORY:  Past Medical History:  Diagnosis Date   Abdominal aortic aneurysm (AAA), 30-34 mm diameter 06/01/2017   Nml w/ greatest dm 3 cm US Aorta 06/2016, Abd [place] 05/01/2017 showed infrarenal dilatation at 3.4 cm - rec repeat US in 3 yrs.   Arthritis    Cervical disc disease 12/26/2011   Coronary artery disease involving native coronary artery of native heart without angina pectoris 12/01/2016   He had coronary angiography in 2011. There was irregularity within the LAD with up to 30% narrowing. The myocardial perfusion imaging done 11/07/2015 did not demonstrate any evidence of ischemia with a low risk nuclear stress test other than EF estimated at 47%. Chest CT 01/25/2016 showed diffuse coronary artery calcifications with heavy calcifications in the LAD. Pt seen by cardiology Dr. Linard Millers    Diabetes mellitus    Difficult intubation 04/05/2014   Dyslipidemia    Family history of anesthesia complication    pt states took days for his father to awaken after mask was used    Fatty liver 06/01/2017   [place] 05/01/2017   GERD (gastroesophageal reflux disease)    has been having acid reflux since recent endoscopy  H/O hiatal hernia    repair with lap band, now has ventral hernia midline abd   Hearing loss-aides 03/26/2012   Hyperlipidemia    Hypertension    Morbid obesity ([place])    OSA (obstructive sleep apnea)    Rotator cuff tear    Seasonal allergies    Shortness of breath    pt states related to high BP meds with extended walking or  climbling stairs   Sleep apnea     SURGICAL HISTORY: Past Surgical History:  Procedure Laterality Date   CARDIAC CATHETERIZATION     approx 3 years ago    COLONOSCOPY WITH PROPOFOL N/A 03/10/2015   Procedure: COLONOSCOPY WITH PROPOFOL;  Surgeon: Milus Banister, MD;  Location: [facility];  Service: Endoscopy;  Laterality: N/A;   colonscopy      2012   ESOPHAGOGASTRODUODENOSCOPY (EGD) WITH PROPOFOL N/A 03/10/2015   Procedure: ESOPHAGOGASTRODUODENOSCOPY (EGD) WITH PROPOFOL;  Surgeon: Milus Banister, MD;  Location: [facility];  Service: Endoscopy;  Laterality: N/A;   HERNIA REPAIR     with gastric banding   LAPAROSCOPIC GASTRIC BANDING N/A 04/05/2014   Procedure: LAPAROSCOPIC GASTRIC BANDING;  Surgeon: Pedro Earls, MD;  Location: [facility];  Service: General;  Laterality: N/A;   MENISCUS REPAIR Left    left knee torn meniscus   VASECTOMY      SOCIAL HISTORY: Social History   Socioeconomic History   Marital status: Married    Spouse name: Not on file   Number of children: 2   Years of education: Not on file   Highest education level: Not on file  Occupational History   Occupation: owner    Comment: [employer]  Tobacco Use   Smoking status: Former    Packs/day: 4.00    Years: 20.00    Pack years: 80.00    Types: Cigarettes    Start date: 07/19/1973    Quit date: 12/13/1991    Years since quitting: 29.5   Smokeless tobacco: Never  Vaping Use   Vaping Use: Never used  Substance and Sexual Activity   Alcohol use: Yes    Alcohol/week: 3.0 - 5.0 standard drinks    Types: 3 - 5 Glasses of wine per week    Comment: takes 3 to 5 glasses of wine nightly    Drug use: No   Sexual activity: Not on file  Other Topics Concern   Not on file  Social History Narrative   Not on file   Social Determinants of Health   Financial Resource Strain: Not on file  Food Insecurity: Not on file  Transportation Needs: Not on file  Physical Activity: Not on file  Stress: Not on  file  Social Connections: Not on file  Intimate Partner Violence: Not on file    FAMILY HISTORY: Family History  Problem Relation Age of Onset   Clotting disorder Mother        lung clot   Mesothelioma Father    Hypertension Brother    Skin cancer Brother    Colon cancer Neg Hx     ALLERGIES:  is allergic to farxiga [dapagliflozin], erythromycin, penicillins, and quercus robur.  MEDICATIONS:  Current Outpatient Medications  Medication Sig Dispense Refill   Ciclopirox 0.77 % gel Apply 1 application topically 2 (two) times daily. (Patient taking differently: Apply 1 application topically as needed (athlete foot).) 45 g 0   diltiazem (CARDIZEM CD) 300 MG 24 hr capsule Take 300 mg by mouth daily.  doxycycline (VIBRA-TABS) 100 MG tablet Take 1 tablet (100 mg total) by mouth 2 (two) times daily. 20 tablet 0   fluticasone (FLONASE) 50 MCG/ACT nasal spray Place 2 sprays into both nostrils daily. 16 g 2   fluticasone-salmeterol (ADVAIR HFA) 115-21 MCG/ACT inhaler Inhale 2 puffs into the lungs 2 (two) times daily. (Patient taking differently: Inhale 2 puffs into the lungs as needed (sob).) 1 each 3   glucose blood (TRUETEST TEST) test strip Use as instructed 100 each 2   insulin degludec (TRESIBA FLEXTOUCH) 200 UNIT/ML FlexTouch Pen Inject 114 Units into the skin daily.     Insulin Pen Needle (B-D UF III MINI PEN NEEDLES) 31G X 5 MM MISC USE AS DIRECTED ONCE A DAY 100 each 11   Multiple Vitamin (MULTIVITAMIN) tablet Take 1 tablet by mouth every morning.      NEOMYCIN-POLYMYXIN-HYDROCORTISONE (CORTISPORIN) 1 % SOLN OTIC solution as needed.     pantoprazole (PROTONIX) 40 MG tablet Take 40 mg by mouth as needed.     Potassium Chloride ER 20 MEQ TBCR Take 60 mEq by mouth daily. 180 tablet 3   rosuvastatin (CRESTOR) 20 MG tablet Take 1 tablet (20 mg total) by mouth daily. 90 tablet 1   silver sulfADIAZINE (SILVADENE) 1 % cream Apply 1 application topically daily. 50 g 0   tamsulosin (FLOMAX)  0.4 MG CAPS capsule Take 1 capsule (0.4 mg total) by mouth daily after supper. 90 capsule 0   torsemide (DEMADEX) 20 MG tablet Take 2 tablets (40 mg total) by mouth daily. 180 tablet 3   XARELTO 20 MG TABS tablet Take 20 mg by mouth daily.     No current facility-administered medications for this visit.    REVIEW OF SYSTEMS:   Constitutional: ( - ) fevers, ( - )  chills , ( - ) night sweats Eyes: ( - ) blurriness of vision, ( - ) double vision, ( - ) watery eyes Ears, nose, mouth, throat, and face: ( - ) mucositis, ( - ) sore throat Respiratory: ( - ) cough, ( - ) dyspnea, ( - ) wheezes Cardiovascular: ( - ) palpitation, ( - ) chest discomfort, ( - ) lower extremity swelling Gastrointestinal:  ( - ) nausea, ( - ) heartburn, ( - ) change in bowel habits Skin: ( - ) abnormal skin rashes Lymphatics: ( - ) new lymphadenopathy, ( - ) easy bruising Neurological: ( - ) numbness, ( - ) tingling, ( - ) new weaknesses Behavioral/Psych: ( - ) mood change, ( - ) new changes  All other systems were reviewed with the patient and are negative.  PHYSICAL EXAMINATION:  Vitals:   06/28/21 1107  BP: 114/73  Pulse: 77  Resp: 18  SpO2: 97%   Filed Weights   06/28/21 1107  Weight: 298 lb 7 oz (135.4 kg)    GENERAL: well appearing elderly Caucasian male, in NAD  SKIN: skin color, texture, turgor are normal, no rashes or significant lesions EYES: conjunctiva are pink and non-injected, sclera clear LUNGS: clear to auscultation and percussion with normal breathing effort HEART: regular rate & rhythm and no murmurs and no lower extremity edema Musculoskeletal: no cyanosis of digits and no clubbing  PSYCH: alert & oriented x 3, fluent speech NEURO: no focal motor/sensory deficits    LABORATORY DATA:  I have reviewed the data as listed CBC Latest Ref Rng & Units 06/28/2021 04/25/2021 02/28/2021  WBC 4.0 - 10.5 K/uL 5.5 6.5 4.3  Hemoglobin 13.0 - 17.0 g/dL  14.0 15.3 14.6  Hematocrit 39.0 - 52.0 %  41.1 43.8 43.0  Platelets 150 - 400 K/uL 170 195 121(L)    CMP Latest Ref Rng & Units 06/28/2021 04/25/2021 02/16/2021  Glucose 70 - 99 mg/dL 265(H) 202(H) 163(H)  BUN 8 - 23 mg/dL 15 14 11   Creatinine 0.61 - 1.24 mg/dL 1.40(H) 1.41(H) 1.28(H)  Sodium 135 - 145 mmol/L 139 137 141  Potassium 3.5 - 5.1 mmol/L 3.8 3.5 4.1  Chloride 98 - 111 mmol/L 100 97(L) 104  CO2 22 - 32 mmol/L 31 28 26   Calcium 8.9 - 10.3 mg/dL 8.6(L) 8.6(L) 8.9  Total Protein 6.5 - 8.1 g/dL 6.7 6.8 6.8  Total Bilirubin 0.3 - 1.2 mg/dL 0.7 0.8 0.4  Alkaline Phos 38 - 126 U/L 54 69 62  AST 15 - 41 U/L 40 40 27  ALT 0 - 44 U/L 31 41 26    No results found for: MPROTEIN  RADIOGRAPHIC STUDIES: No results found.  ASSESSMENT & PLAN BARBARA KENG 66 y.o. male with medical history significant for unprovoked pulmonary embolism who presents for a follow up visit.   After review of the labs, review of the records, and discussion with the patient the patients findings are most consistent with incidental finding of a segmental pulmonary thromboembolism in the right lower lobe.  The patient does travel a great deal and he is overweight which may be contributing to his blood clot, however these are not necessarily modifiable risk factors.  Given the incidental nature of this clot is unclear if this represents a provoked or unprovoked VTE.  In the interest of being cautious I would recommend we treat this as an unprovoked.  # Unprovoked RLL Segmental Pulmonary Embolism -- Findings are consistent with an unprovoked right lower lobe segmental pulmonary embolism --Given this finding would recommend indefinite anticoagulation --Continue treatment with Xarelto 20 mg p.o. daily.  We will continue this therapy for 12 months time (due to persistent symptoms at the 12 month mark) and then reevaluate and consider decreasing down to maintenance dose --Labs are currently appropriate for continuation of anticoagulation therapy.  Hemoglobin  14.0, creatinine 1.40, and AST 40 ALT 31. --Return to clinic in 3 months time to continue monitoring   No orders of the defined types were placed in this encounter.   All questions were answered. The patient knows to call the clinic with any problems, questions or concerns.  A total of more than 30 minutes were spent on this encounter with face-to-face time and non-face-to-face time, including preparing to see the patient, ordering tests and/or medications, counseling the patient and coordination of care as outlined above.   Ledell Peoples, MD Department of Hematology/Oncology [place] [facility] Phone: [phone] Pager: [phone] Email: [email or web address]  06/28/2021 12:56 PM

## 2021-06-29 ENCOUNTER — Ambulatory Visit (INDEPENDENT_AMBULATORY_CARE_PROVIDER_SITE_OTHER): Payer: Medicare Other | Admitting: Podiatry

## 2021-06-29 ENCOUNTER — Ambulatory Visit: Payer: Medicare Other

## 2021-06-29 DIAGNOSIS — M2042 Other hammer toe(s) (acquired), left foot: Secondary | ICD-10-CM | POA: Diagnosis not present

## 2021-06-29 DIAGNOSIS — E1149 Type 2 diabetes mellitus with other diabetic neurological complication: Secondary | ICD-10-CM

## 2021-06-29 DIAGNOSIS — E11621 Type 2 diabetes mellitus with foot ulcer: Secondary | ICD-10-CM | POA: Diagnosis not present

## 2021-06-29 DIAGNOSIS — M2041 Other hammer toe(s) (acquired), right foot: Secondary | ICD-10-CM | POA: Diagnosis not present

## 2021-06-29 MED ORDER — SILVER SULFADIAZINE 1 % EX CREA: 1.0000 "application " | TOPICAL_CREAM | Freq: Every day | CUTANEOUS | 0 refills | Status: DC

## 2021-06-29 MED ORDER — DOXYCYCLINE HYCLATE 100 MG PO TABS: 100.0000 mg | ORAL_TABLET | Freq: Two times a day (BID) | ORAL | 0 refills | Status: DC

## 2021-06-29 NOTE — Progress Notes (Signed)
SITUATION Reason for Visit: Fitting of Diabetic Shoes & Insoles Patient / Caregiver Report:  Patient reports comfort and is satisfied  OBJECTIVE DATA: Patient History / Diagnosis:     ICD-10-CM   1. Type II diabetes mellitus with neurological manifestations ([place])  E11.49     2. Diabetic ulcer of right great toe ([place])  E11.621    L97.519       Change in Status:   None  ACTIONS PERFORMED: In-Person Delivery, patient was fit with: - 1x pair [id number] PDAC approved prefabricated Diabetic Shoes: APEX pennyloafers - 3x pair [id number] PDAC approved CAM milled custom diabetic insoles  Shoes and insoles were verified for structural integrity and safety. Patient wore shoes and insoles in office. Skin was inspected and free of areas of concern after wearing shoes and inserts. Shoes and inserts fit properly. Patient / Caregiver provided with ferbal instruction and demonstration regarding donning, doffing, wear, care, proper fit, function, purpose, cleaning, and use of shoes and insoles ' and in all related precautions and risks and benefits regarding shoes and insoles. Patient / Caregiver was instructed to wear properly fitting socks with shoes at all times. Patient was also provided with verbal instruction regarding how to report any failures or malfunctions of shoes or inserts, and necessary follow up care. Patient / Caregiver was also instructed to contact physician regarding change in status that may affect function of shoes and inserts.   Patient / Caregiver verbalized undersatnding of instruction provided. Patient / Caregiver demonstrated independence with proper donning and doffing of shoes and inserts.  PLAN Patient to follow up as needed. Plan of care was discussed with and agreed upon by patient and/or caregiver. All questions were answered and concerns addressed.

## 2021-06-30 ENCOUNTER — Telehealth: Payer: Self-pay | Admitting: *Deleted

## 2021-06-30 NOTE — Telephone Encounter (Signed)
Pharmacy(Katie) is calling for clarification on a medication( silver sulfadiazine-1% cream)sent. She wants to know how long a 50 gr tube will last the patient per insurance purposes. Please advise.

## 2021-06-30 NOTE — Telephone Encounter (Signed)
Returned the call and spoke with [employer] giving information for silvadene 50 gr, is a 30 day supply,verbalized understanding.

## 2021-07-04 NOTE — Progress Notes (Signed)
Subjective: 66 year old male presents the office today for follow evaluation of wound on his right big toe.  Since I last saw him in November he notes traveled quite a bit.  Surgical shoe worn out so he has been wearing his regular shoe.  He did notice a blood blister due to this he started antibiotics, doxycycline that he had on hand just in case while traveling.  He states he started because there was some redness but after couple days that redness.  He states the toe is doing much better.  Denies any drainage or pus at this time.  No red streaking.  Denies any fever or chills.  No other concerns.  Objective: AAO x3, NAD DP/PT pulses palpable bilaterally, CRT less than 3 seconds Medial aspect of the hallux is a thick hyperkeratotic lesion return to clinic.  Upon debridement there is a superficial area of skin breakdown.  No ascending cellulitis.  There is no fluctuation or crepitation.  No malodor.  There is no probing, undermining or tunneling. No significant evidence of tinea pedis today. No pain with calf compression, swelling, warmth, erythema  Assessment: 65 year old male with ulceration right  Plan: -All treatment options discussed with the patient including all alternatives, risks, complications.  -Sharp debrided hyperkeratotic tissue today without any complications reveal a superficial granular wound present likely from irritation given the thick callus.  There is no evidence of infection today.  Continue daily dressing changes.  Washing with soap and water daily as well.  Continue offloading. -Diabetic shoes were dispensed by our orthotist, Aaron Edelman. -Refilled Silvadene to apply. -Prescribed doxycycline.  This is only in case of infection signs. -Patient encouraged to call the office with any questions, concerns, change in symptoms.

## 2021-07-12 ENCOUNTER — Encounter: Payer: Self-pay | Admitting: Internal Medicine

## 2021-07-12 NOTE — Telephone Encounter (Signed)
MR- please see email from pt   In looking back I found the above subject.  I don't recall anyone referencing this condition in meetings or in text beyond a simple word. Do I have this or not? reference Aug. 2022   thanks, [name]  Paraseptal Emphysema  Please advise, thanks!

## 2021-07-12 NOTE — Telephone Encounter (Signed)
Emphhysema was not mentioned (despite prior smoking hx) in 2017 CT chest.  I saw him after long time in Aug 2022 an dodered HRCT andt hen Beth saw him to revie results both 02/15/21 and 03/08/21. She did per her note disclose emphysema and gave Advair. Does that help answer [name] question?  If not, can do visit to address any questions  Thanks  MR       PFT Results Latest Ref Rng & Units 02/15/2021  FVC-Pre L 2.98  FVC-Predicted Pre % 60  FVC-Post L 3.33  FVC-Predicted Post % 67  Pre FEV1/FVC % % 84  Post FEV1/FCV % % 84  FEV1-Pre L 2.49  FEV1-Predicted Pre % 67  FEV1-Post L 2.80  DLCO uncorrected ml/min/mmHg 30.12  DLCO UNC% % 105  DLVA Predicted % 143  TLC L 5.84  TLC % Predicted % 78  RV % Predicted % 109

## 2021-07-13 NOTE — Progress Notes (Signed)
Cardiology Office Note:    Date:  07/14/2021   ID:  [name], DOB 1955-11-14, MRN [id number]  PCP:  [name], MD   [facility] Providers Cardiologist:  Werner Lean, MD     Referring MD: [name], MD   CC: SOB  History of Present Illness:    [name] is a 66 y.o. male with a hx of mild non obstructive CAD, AAA,Morbid Obesity, DM, and HTN; OSA on CPAP COVID-19 2021 who presents for evaluation 10/21/20.  In interim of this visit, patient started on lasix with some improved symptoms.  Echo pending.  Seen 02/01/21  In interim of this visit, patient had worsening breathing and we transitioned to torsemide.  Left on an international trip and came back. Seen 06/2721.  Patient notes that he is doing better:  he was talking torsemide 40 mg PO BID and had diarrhea from his antibiotics: with this fel dizzy and weak.  The diarrhea is persisting but with torsemide 40 mg dizziness has resolved, leg swelling has improved but not resolved.  His hematologist is getting DOAC changed. His K 04/25/21 is 3.5 and he has never had hyperkalemia (he is on 40 mg PO daily).   No chest pain or pressure .  SOB has slightly imrproved and no PND/Orthopnea.  No weight gain.  No palpitations or syncope.   Past Medical History:  Diagnosis Date   Abdominal aortic aneurysm (AAA), 30-34 mm diameter 06/01/2017   Nml w/ greatest dm 3 cm US Aorta 06/2016, Abd [place] 05/01/2017 showed infrarenal dilatation at 3.4 cm - rec repeat US in 3 yrs.   Arthritis    Cervical disc disease 12/26/2011   Coronary artery disease involving native coronary artery of native heart without angina pectoris 12/01/2016   He had coronary angiography in 2011. There was irregularity within the LAD with up to 30% narrowing. The myocardial perfusion imaging done 11/07/2015 did not demonstrate any evidence of ischemia with a low risk nuclear stress test other than EF estimated at 47%. Chest CT 01/25/2016 showed diffuse  coronary artery calcifications with heavy calcifications in the LAD. Pt seen by cardiology Dr. Linard Millers    Diabetes mellitus    Difficult intubation 04/05/2014   Dyslipidemia    Family history of anesthesia complication    pt states took days for his father to awaken after mask was used    Fatty liver 06/01/2017   [place] 05/01/2017   GERD (gastroesophageal reflux disease)    has been having acid reflux since recent endoscopy    H/O hiatal hernia    repair with lap band, now has ventral hernia midline abd   Hearing loss-aides 03/26/2012   Hyperlipidemia    Hypertension    Morbid obesity ([place])    OSA (obstructive sleep apnea)    Rotator cuff tear    Seasonal allergies    Shortness of breath    pt states related to high BP meds with extended walking or climbling stairs   Sleep apnea     Past Surgical History:  Procedure Laterality Date   CARDIAC CATHETERIZATION     approx 3 years ago    COLONOSCOPY WITH PROPOFOL N/A 03/10/2015   Procedure: COLONOSCOPY WITH PROPOFOL;  Surgeon: Milus Banister, MD;  Location: [facility];  Service: Endoscopy;  Laterality: N/A;   colonscopy      2012   ESOPHAGOGASTRODUODENOSCOPY (EGD) WITH PROPOFOL N/A 03/10/2015   Procedure: ESOPHAGOGASTRODUODENOSCOPY (EGD) WITH PROPOFOL;  Surgeon: Quillian Quince  Merrily Brittle, MD;  Location: [facility];  Service: Endoscopy;  Laterality: N/A;   HERNIA REPAIR     with gastric banding   LAPAROSCOPIC GASTRIC BANDING N/A 04/05/2014   Procedure: LAPAROSCOPIC GASTRIC BANDING;  Surgeon: Pedro Earls, MD;  Location: [facility];  Service: General;  Laterality: N/A;   MENISCUS REPAIR Left    left knee torn meniscus   VASECTOMY      Current Medications: Current Meds  Medication Sig   diltiazem (CARDIZEM CD) 300 MG 24 hr capsule Take 300 mg by mouth daily.   doxycycline (VIBRA-TABS) 100 MG tablet Take 1 tablet (100 mg total) by mouth 2 (two) times daily. (Patient taking differently: Take 100 mg by mouth as needed (toe infection).)    fluticasone (FLONASE) 50 MCG/ACT nasal spray Place 2 sprays into both nostrils daily.   glucose blood (TRUETEST TEST) test strip Use as instructed   insulin degludec (TRESIBA FLEXTOUCH) 200 UNIT/ML FlexTouch Pen Inject 114 Units into the skin daily.   Insulin Pen Needle (B-D UF III MINI PEN NEEDLES) 31G X 5 MM MISC USE AS DIRECTED ONCE A DAY   lisinopril (ZESTRIL) 40 MG tablet Take 40 mg by mouth daily.   Multiple Vitamin (MULTIVITAMIN) tablet Take 1 tablet by mouth every morning.    NEOMYCIN-POLYMYXIN-HYDROCORTISONE (CORTISPORIN) 1 % SOLN OTIC solution as needed.   Potassium Chloride ER 20 MEQ TBCR Take 60 mEq by mouth daily.   rosuvastatin (CRESTOR) 20 MG tablet Take 1 tablet (20 mg total) by mouth daily.   silver sulfADIAZINE (SILVADENE) 1 % cream Apply 1 application topically daily.   tamsulosin (FLOMAX) 0.4 MG CAPS capsule Take 1 capsule (0.4 mg total) by mouth daily after supper.   torsemide (DEMADEX) 20 MG tablet Take 2 tablets (40 mg total) by mouth daily.   XARELTO 20 MG TABS tablet Take 20 mg by mouth daily.     Allergies:   Farxiga [dapagliflozin], Erythromycin, Penicillins, and Quercus robur   Social History   Socioeconomic History   Marital status: Married    Spouse name: Not on file   Number of children: 2   Years of education: Not on file   Highest education level: Not on file  Occupational History   Occupation: owner    Comment: [employer]  Tobacco Use   Smoking status: Former    Packs/day: 4.00    Years: 20.00    Pack years: 80.00    Types: Cigarettes    Start date: 07/19/1973    Quit date: 12/13/1991    Years since quitting: 29.6   Smokeless tobacco: Never  Vaping Use   Vaping Use: Never used  Substance and Sexual Activity   Alcohol use: Yes    Alcohol/week: 3.0 - 5.0 standard drinks    Types: 3 - 5 Glasses of wine per week    Comment: takes 3 to 5 glasses of wine nightly    Drug use: No   Sexual activity: Not on file  Other Topics Concern   Not on  file  Social History Narrative   Not on file   Social Determinants of Health   Financial Resource Strain: Not on file  Food Insecurity: Not on file  Transportation Needs: Not on file  Physical Activity: Not on file  Stress: Not on file  Social Connections: Not on file    Social: I took care of his wife Margarita Grizzle as well; 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 There is no history of Colon cancer. History of coronary artery disease notable for no members. History of heart failure notable for no members. History of arrhythmia notable for no members.  ROS:   Please see the history of present illness.     All other systems reviewed and are negative.  EKGs/Labs/Other Studies Reviewed:    The following studies were reviewed today:  EKG:   1/27/23SR rate 69 10/21/20: Sinus bradycardia rate 55 1st HB  NonCardiac CT: Date: 01/25/2016 Results: Aortic Atherosclerosis 3VD Calcification with LAD predominance  OSH CTPE  DATE 12/16/20 The ascending thoracic aorta measures 3.6 x 3.5 cm in transverse by AP dimension. The descending thoracic aorta measures 2.6 x 2.6 cm in transverse by AP dimension. There is atherosclerotic disease of the thoracic aorta and coronary arteries without evidence of focal intimal irregularity.   There is a central pulmonary arterial filling defect in the right lower lobe segmental pulmonary artery (series 4, image 43). No CT evidence of right heart strain. No pericardial effusion or bulky mediastinal adenopathy.   No focal consolidation. No pleural effusion or pneumothorax.   Gastric band is present. There is a cyst dorsally seen in the upper pole the right kidney. The visualized portions of the upper abdomen are otherwiseunremarkable.   Transthoracic  Echocardiogram: Date: 02/03/21 Results:  1. Left ventricular ejection fraction, by estimation, is 60 to 65%. The  left ventricle has normal function. The left ventricle has no regional  wall motion abnormalities. There is moderate left ventricular hypertrophy.  Left ventricular diastolic  parameters are consistent with Grade I diastolic dysfunction (impaired  relaxation).   2. Right ventricular systolic function is normal. The right ventricular  size is normal.   3. The mitral valve is abnormal. Trivial mitral valve regurgitation.  Moderate mitral annular calcification.   4. The aortic valve is tricuspid. Aortic valve regurgitation is not  visualized.   NM Stress Testing : Date: 11/08/2015 Results: Nuclear stress EF: 47%. The left ventricular ejection fraction is mildly decreased (45-54%). There was no ST segment deviation noted during stress. This is a low risk study. There is no evidence of ischemia or previous infarction  Abdominal Aortic Duplex Date: 04/17/2019 Abdominal Aorta: There is evidence of abnormal dilatation of the distal  Abdominal aorta. The largest aortic measurement is 2.9 cm. The largest  aortic diameter remains essentially unchanged compared to prior exam.  Previous diameter measurement was 3.0 cm  obtained on 06/2016.   Left/Right Heart Catheterizations: [id number] Results: Mild obstructive only per chart review.   Recent Labs: 10/21/2020: NT-Pro BNP 18 02/01/2021: Magnesium 2.0 06/28/2021: ALT 31; BUN 15; Creatinine 1.40; Hemoglobin 14.0; Platelet Count 170; Potassium 3.8; Sodium 139  Recent Lipid Panel    Component Value Date/Time   CHOL 147 08/15/2018 1001   TRIG 194 (H) 08/15/2018 1001   HDL 54 08/15/2018 1001   CHOLHDL 2.7 08/15/2018 1001   CHOLHDL 5.2 (H) 09/07/2015 1802   VLDL NOT CALC 09/07/2015 1802   LDLCALC 54 08/15/2018 1001   Physical Exam:    VS:  BP 102/60    Pulse 69    Ht 6' (1.829 m)    Wt (!) 137 kg    SpO2 96%    BMI 40.96 kg/m      Wt Readings from Last 3 Encounters:  07/14/21 (!) 137 kg  06/28/21 135.4  kg  04/26/21 (!) 136.5 kg    Gen: No distress, Morbid obese Neck: No JVD, no carotid bruit Cardiac: No Rubs or Gallops, No Murmur, normal rhythm, +2 radial pulses Respiratory: Clear to auscultation bilaterally, normal effort, normal  respiratory rate GI: Soft, nontender, non-distended  MS: +2 bilateral edema;  moves all extremities Integument: Skin feels warm Neuro:  At time of evaluation, alert and oriented to person/place/time/situation  Psych: Normal affect, patient feels    ASSESSMENT:    1. Aortic atherosclerosis ([place])   2. Heart failure, type unknown ([place])   3. History of pulmonary embolus (PE)   4. Coronary artery disease involving native coronary artery of native heart without angina pectoris   5. DOE (dyspnea on exertion)   6. OSA (obstructive sleep apnea)     PLAN:    HFpEF CAD; mild non obstructive in 2011; LAD disease Aortic atherosclerosis AAA - 30 mm (vs ULN)- consider imaging in the future visit Morbid Obesity, DM, and HTN;  OSA on CPAP Subacute PE (unclear provocation) - NYHA class III, Stage C, hypervolemic, etiology is multifactorial - Diuretic regimen: torsemide 40 mg PO daily  - will check BMP and may increase is 40 meq K - diltiazem 300 mg PO daily - on Xarelto per oncology - continue statin  - patient notes significant response to diuretics and yet still has no improvement in his SOB.  Has seen pulm and there has still been no improvement.   - he has LE edema- I am unclear to the etiology of his I suspect multifactorial SOB - will do LHC and RHC, based on this may increase torsemide to 80 if elevated pressures  - will do RHC and LHC - Risks and benefits of cardiac catheterization have been discussed with the patient.  These include bleeding, infection, kidney damage, stroke, heart attack, death.  The patient understands these risks and is willing to proceed.  Post cath  follow up me or APP   Medication Adjustments/Labs and Tests Ordered: Current medicines are reviewed at length with the patient today.  Concerns regarding medicines are outlined above.  Orders Placed This Encounter  Procedures   CBC   Basic metabolic panel   EKG 67-MCNO     No orders of the defined types were placed in this encounter.    Patient Instructions  Medication Instructions:  Your physician recommends that you continue on your current medications as directed. Please refer to the Current Medication list given to you today.  *If you need a refill on your cardiac medications before your next appointment, please call your pharmacy*   Lab Work: TODAY: CBC and BMET If you have labs (blood work) drawn today and your tests are completely normal, you will receive your results only by: [place] (if you have MyChart) OR A paper copy in the mail If you have any lab test that is abnormal or we need to change your treatment, we will call you to review the results.   Testing/Procedures: Your physician has requested that you have a cardiac catheterization. Cardiac catheterization is used to diagnose and/or treat various heart conditions. Doctors may recommend this procedure for a number of different reasons. The most common reason is to evaluate chest pain. Chest pain can be a symptom of coronary artery disease (CAD), and cardiac catheterization can show whether plaque is narrowing or blocking your hearts arteries. This procedure is also used to evaluate the valves, as well as measure the blood flow and oxygen levels in  different parts of your heart. For further information please visit [email or web address]. Please follow instruction sheet, as given.    Follow-Up: At [facility], you and your health needs are our priority.  As part of our continuing mission to provide you with exceptional heart care, we have created designated Provider Care Teams.  These Care Teams include your  primary Cardiologist (physician) and Advanced Practice Providers (APPs -  Physician Assistants and Nurse Practitioners) who all work together to provide you with the care you need, when you need it.  Your next appointment:   1 month(s)  The format for your next appointment:   In Person  Provider:   Werner Lean, MD     Other Instructions   [facility] OFFICE [place], [place] [address] Dept: [phone] Loc: [place]  07/14/2021  You are scheduled for a Cardiac Catheterization on Tuesday, July 18, 2021 with Dr. Larae Grooms.  1. Please arrive at the [facility] (Main Entrance A) at [facility]: [address] [place], [place] [address] at 11:30 AM (This time is two hours before your procedure to ensure your preparation). Free valet parking service is available.   Special note: Every effort is made to have your procedure done on time. Please understand that emergencies sometimes delay scheduled procedures.  2. Diet: Do not eat solid foods after midnight.  The patient may have clear liquids until 5am upon the day of the procedure.  3. Labs: You will need to have blood drawn TODAY  4. Medication instructions in preparation for your procedure:   Contrast Allergy: No    Stop taking Xarelto (Rivaroxaban) on Sunday, January 30.  Stop taking, Torsemide (Demadex) Tuesday, January 31,  Take only 57 units of insulin the night before your procedure. Do not take any insulin on the day of the procedure.    On the morning of your procedure, take your Aspirin 81 mg and any morning medicines NOT listed above.  You may use sips of water.  5. Plan for one night stay--bring personal belongings. 6. Bring a current list of your medications and current insurance cards. 7. You MUST have a responsible person to drive you home. 8. Someone MUST be  with you the first 24 hours after you arrive home or your discharge will be delayed. 9. Please wear clothes that are easy to get on and off and wear slip-on shoes.  Thank you for allowing [place] to care for you!   -- [facility] Invasive Cardiovascular services    Signed, Werner Lean, MD  07/14/2021 3:42 PM    [place]

## 2021-07-13 NOTE — H&P (View-Only) (Signed)
Cardiology Office Note:    Date:  07/14/2021   ID:  [name], DOB May 16, 1956, MRN [id number]  PCP:  [name], MD   [facility] Providers Cardiologist:  Werner Lean, MD     Referring MD: [name], MD   CC: SOB  History of Present Illness:    [name] is a 66 y.o. male with a hx of mild non obstructive CAD, AAA,Morbid Obesity, DM, and HTN; OSA on CPAP COVID-19 2021 who presents for evaluation 10/21/20.  In interim of this visit, patient started on lasix with some improved symptoms.  Echo pending.  Seen 02/01/21  In interim of this visit, patient had worsening breathing and we transitioned to torsemide.  Left on an international trip and came back. Seen 06/2721.  Patient notes that he is doing better:  he was talking torsemide 40 mg PO BID and had diarrhea from his antibiotics: with this fel dizzy and weak.  The diarrhea is persisting but with torsemide 40 mg dizziness has resolved, leg swelling has improved but not resolved.  His hematologist is getting DOAC changed. His K 04/25/21 is 3.5 and he has never had hyperkalemia (he is on 40 mg PO daily).   No chest pain or pressure .  SOB has slightly imrproved and no PND/Orthopnea.  No weight gain.  No palpitations or syncope.   Past Medical History:  Diagnosis Date   Abdominal aortic aneurysm (AAA), 30-34 mm diameter 06/01/2017   Nml w/ greatest dm 3 cm US Aorta 06/2016, Abd [place] 05/01/2017 showed infrarenal dilatation at 3.4 cm - rec repeat US in 3 yrs.   Arthritis    Cervical disc disease 12/26/2011   Coronary artery disease involving native coronary artery of native heart without angina pectoris 12/01/2016   He had coronary angiography in 2011. There was irregularity within the LAD with up to 30% narrowing. The myocardial perfusion imaging done 11/07/2015 did not demonstrate any evidence of ischemia with a low risk nuclear stress test other than EF estimated at 47%. Chest CT 01/25/2016 showed diffuse  coronary artery calcifications with heavy calcifications in the LAD. Pt seen by cardiology Dr. Linard Millers    Diabetes mellitus    Difficult intubation 04/05/2014   Dyslipidemia    Family history of anesthesia complication    pt states took days for his father to awaken after mask was used    Fatty liver 06/01/2017   [place] 05/01/2017   GERD (gastroesophageal reflux disease)    has been having acid reflux since recent endoscopy    H/O hiatal hernia    repair with lap band, now has ventral hernia midline abd   Hearing loss-aides 03/26/2012   Hyperlipidemia    Hypertension    Morbid obesity ([place])    OSA (obstructive sleep apnea)    Rotator cuff tear    Seasonal allergies    Shortness of breath    pt states related to high BP meds with extended walking or climbling stairs   Sleep apnea     Past Surgical History:  Procedure Laterality Date   CARDIAC CATHETERIZATION     approx 3 years ago    COLONOSCOPY WITH PROPOFOL N/A 03/10/2015   Procedure: COLONOSCOPY WITH PROPOFOL;  Surgeon: Milus Banister, MD;  Location: [facility];  Service: Endoscopy;  Laterality: N/A;   colonscopy      2012   ESOPHAGOGASTRODUODENOSCOPY (EGD) WITH PROPOFOL N/A 03/10/2015   Procedure: ESOPHAGOGASTRODUODENOSCOPY (EGD) WITH PROPOFOL;  Surgeon: Quillian Quince  Merrily Brittle, MD;  Location: [facility];  Service: Endoscopy;  Laterality: N/A;   HERNIA REPAIR     with gastric banding   LAPAROSCOPIC GASTRIC BANDING N/A 04/05/2014   Procedure: LAPAROSCOPIC GASTRIC BANDING;  Surgeon: Pedro Earls, MD;  Location: [facility];  Service: General;  Laterality: N/A;   MENISCUS REPAIR Left    left knee torn meniscus   VASECTOMY      Current Medications: Current Meds  Medication Sig   diltiazem (CARDIZEM CD) 300 MG 24 hr capsule Take 300 mg by mouth daily.   doxycycline (VIBRA-TABS) 100 MG tablet Take 1 tablet (100 mg total) by mouth 2 (two) times daily. (Patient taking differently: Take 100 mg by mouth as needed (toe infection).)    fluticasone (FLONASE) 50 MCG/ACT nasal spray Place 2 sprays into both nostrils daily.   glucose blood (TRUETEST TEST) test strip Use as instructed   insulin degludec (TRESIBA FLEXTOUCH) 200 UNIT/ML FlexTouch Pen Inject 114 Units into the skin daily.   Insulin Pen Needle (B-D UF III MINI PEN NEEDLES) 31G X 5 MM MISC USE AS DIRECTED ONCE A DAY   lisinopril (ZESTRIL) 40 MG tablet Take 40 mg by mouth daily.   Multiple Vitamin (MULTIVITAMIN) tablet Take 1 tablet by mouth every morning.    NEOMYCIN-POLYMYXIN-HYDROCORTISONE (CORTISPORIN) 1 % SOLN OTIC solution as needed.   Potassium Chloride ER 20 MEQ TBCR Take 60 mEq by mouth daily.   rosuvastatin (CRESTOR) 20 MG tablet Take 1 tablet (20 mg total) by mouth daily.   silver sulfADIAZINE (SILVADENE) 1 % cream Apply 1 application topically daily.   tamsulosin (FLOMAX) 0.4 MG CAPS capsule Take 1 capsule (0.4 mg total) by mouth daily after supper.   torsemide (DEMADEX) 20 MG tablet Take 2 tablets (40 mg total) by mouth daily.   XARELTO 20 MG TABS tablet Take 20 mg by mouth daily.     Allergies:   Farxiga [dapagliflozin], Erythromycin, Penicillins, and Quercus robur   Social History   Socioeconomic History   Marital status: Married    Spouse name: Not on file   Number of children: 2   Years of education: Not on file   Highest education level: Not on file  Occupational History   Occupation: owner    Comment: [employer]  Tobacco Use   Smoking status: Former    Packs/day: 4.00    Years: 20.00    Pack years: 80.00    Types: Cigarettes    Start date: 07/19/1973    Quit date: 12/13/1991    Years since quitting: 29.6   Smokeless tobacco: Never  Vaping Use   Vaping Use: Never used  Substance and Sexual Activity   Alcohol use: Yes    Alcohol/week: 3.0 - 5.0 standard drinks    Types: 3 - 5 Glasses of wine per week    Comment: takes 3 to 5 glasses of wine nightly    Drug use: No   Sexual activity: Not on file  Other Topics Concern   Not on  file  Social History Narrative   Not on file   Social Determinants of Health   Financial Resource Strain: Not on file  Food Insecurity: Not on file  Transportation Needs: Not on file  Physical Activity: Not on file  Stress: Not on file  Social Connections: Not on file    Social: I took care of his wife Margarita Grizzle as well; 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 There is no history of Colon cancer. History of coronary artery disease notable for no members. History of heart failure notable for no members. History of arrhythmia notable for no members.  ROS:   Please see the history of present illness.     All other systems reviewed and are negative.  EKGs/Labs/Other Studies Reviewed:    The following studies were reviewed today:  EKG:   1/27/23SR rate 69 10/21/20: Sinus bradycardia rate 55 1st HB  NonCardiac CT: Date: 01/25/2016 Results: Aortic Atherosclerosis 3VD Calcification with LAD predominance  OSH CTPE  DATE 12/16/20 The ascending thoracic aorta measures 3.6 x 3.5 cm in transverse by AP dimension. The descending thoracic aorta measures 2.6 x 2.6 cm in transverse by AP dimension. There is atherosclerotic disease of the thoracic aorta and coronary arteries without evidence of focal intimal irregularity.   There is a central pulmonary arterial filling defect in the right lower lobe segmental pulmonary artery (series 4, image 43). No CT evidence of right heart strain. No pericardial effusion or bulky mediastinal adenopathy.   No focal consolidation. No pleural effusion or pneumothorax.   Gastric band is present. There is a cyst dorsally seen in the upper pole the right kidney. The visualized portions of the upper abdomen are otherwiseunremarkable.   Transthoracic  Echocardiogram: Date: 02/03/21 Results:  1. Left ventricular ejection fraction, by estimation, is 60 to 65%. The  left ventricle has normal function. The left ventricle has no regional  wall motion abnormalities. There is moderate left ventricular hypertrophy.  Left ventricular diastolic  parameters are consistent with Grade I diastolic dysfunction (impaired  relaxation).   2. Right ventricular systolic function is normal. The right ventricular  size is normal.   3. The mitral valve is abnormal. Trivial mitral valve regurgitation.  Moderate mitral annular calcification.   4. The aortic valve is tricuspid. Aortic valve regurgitation is not  visualized.   NM Stress Testing : Date: 11/08/2015 Results: Nuclear stress EF: 47%. The left ventricular ejection fraction is mildly decreased (45-54%). There was no ST segment deviation noted during stress. This is a low risk study. There is no evidence of ischemia or previous infarction  Abdominal Aortic Duplex Date: 04/17/2019 Abdominal Aorta: There is evidence of abnormal dilatation of the distal  Abdominal aorta. The largest aortic measurement is 2.9 cm. The largest  aortic diameter remains essentially unchanged compared to prior exam.  Previous diameter measurement was 3.0 cm  obtained on 06/2016.   Left/Right Heart Catheterizations: [id number] Results: Mild obstructive only per chart review.   Recent Labs: 10/21/2020: NT-Pro BNP 18 02/01/2021: Magnesium 2.0 06/28/2021: ALT 31; BUN 15; Creatinine 1.40; Hemoglobin 14.0; Platelet Count 170; Potassium 3.8; Sodium 139  Recent Lipid Panel    Component Value Date/Time   CHOL 147 08/15/2018 1001   TRIG 194 (H) 08/15/2018 1001   HDL 54 08/15/2018 1001   CHOLHDL 2.7 08/15/2018 1001   CHOLHDL 5.2 (H) 09/07/2015 1802   VLDL NOT CALC 09/07/2015 1802   LDLCALC 54 08/15/2018 1001   Physical Exam:    VS:  BP 102/60    Pulse 69    Ht 6' (1.829 m)    Wt (!) 137 kg    SpO2 96%    BMI 40.96 kg/m      Wt Readings from Last 3 Encounters:  07/14/21 (!) 137 kg  06/28/21 135.4  kg  04/26/21 (!) 136.5 kg    Gen: No distress, Morbid obese Neck: No JVD, no carotid bruit Cardiac: No Rubs or Gallops, No Murmur, normal rhythm, +2 radial pulses Respiratory: Clear to auscultation bilaterally, normal effort, normal  respiratory rate GI: Soft, nontender, non-distended  MS: +2 bilateral edema;  moves all extremities Integument: Skin feels warm Neuro:  At time of evaluation, alert and oriented to person/place/time/situation  Psych: Normal affect, patient feels    ASSESSMENT:    1. Aortic atherosclerosis ([place])   2. Heart failure, type unknown ([place])   3. History of pulmonary embolus (PE)   4. Coronary artery disease involving native coronary artery of native heart without angina pectoris   5. DOE (dyspnea on exertion)   6. OSA (obstructive sleep apnea)     PLAN:    HFpEF CAD; mild non obstructive in 2011; LAD disease Aortic atherosclerosis AAA - 30 mm (vs ULN)- consider imaging in the future visit Morbid Obesity, DM, and HTN;  OSA on CPAP Subacute PE (unclear provocation) - NYHA class III, Stage C, hypervolemic, etiology is multifactorial - Diuretic regimen: torsemide 40 mg PO daily  - will check BMP and may increase is 40 meq K - diltiazem 300 mg PO daily - on Xarelto per oncology - continue statin  - patient notes significant response to diuretics and yet still has no improvement in his SOB.  Has seen pulm and there has still been no improvement.   - he has LE edema- I am unclear to the etiology of his I suspect multifactorial SOB - will do LHC and RHC, based on this may increase torsemide to 80 if elevated pressures  - will do RHC and LHC - Risks and benefits of cardiac catheterization have been discussed with the patient.  These include bleeding, infection, kidney damage, stroke, heart attack, death.  The patient understands these risks and is willing to proceed.  Post cath  follow up me or APP   Medication Adjustments/Labs and Tests Ordered: Current medicines are reviewed at length with the patient today.  Concerns regarding medicines are outlined above.  Orders Placed This Encounter  Procedures   CBC   Basic metabolic panel   EKG 35-TDDU     No orders of the defined types were placed in this encounter.    Patient Instructions  Medication Instructions:  Your physician recommends that you continue on your current medications as directed. Please refer to the Current Medication list given to you today.  *If you need a refill on your cardiac medications before your next appointment, please call your pharmacy*   Lab Work: TODAY: CBC and BMET If you have labs (blood work) drawn today and your tests are completely normal, you will receive your results only by: [place] (if you have MyChart) OR A paper copy in the mail If you have any lab test that is abnormal or we need to change your treatment, we will call you to review the results.   Testing/Procedures: Your physician has requested that you have a cardiac catheterization. Cardiac catheterization is used to diagnose and/or treat various heart conditions. Doctors may recommend this procedure for a number of different reasons. The most common reason is to evaluate chest pain. Chest pain can be a symptom of coronary artery disease (CAD), and cardiac catheterization can show whether plaque is narrowing or blocking your hearts arteries. This procedure is also used to evaluate the valves, as well as measure the blood flow and oxygen levels in  different parts of your heart. For further information please visit HugeFiesta.tn. Please follow instruction sheet, as given.    Follow-Up: At [facility], you and your health needs are our priority.  As part of our continuing mission to provide you with exceptional heart care, we have created designated Provider Care Teams.  These Care Teams include your  primary Cardiologist (physician) and Advanced Practice Providers (APPs -  Physician Assistants and Nurse Practitioners) who all work together to provide you with the care you need, when you need it.  Your next appointment:   1 month(s)  The format for your next appointment:   In Person  Provider:   Werner Lean, MD     Other Instructions   [place] OFFICE [place], [place] [address] Dept: [phone] Loc: [place]  07/14/2021  You are scheduled for a Cardiac Catheterization on Tuesday, July 18, 2021 with Dr. Larae Grooms.  1. Please arrive at the [facility] (Main Entrance A) at [facility]: [address] [place], [place] [address] at 11:30 AM (This time is two hours before your procedure to ensure your preparation). Free valet parking service is available.   Special note: Every effort is made to have your procedure done on time. Please understand that emergencies sometimes delay scheduled procedures.  2. Diet: Do not eat solid foods after midnight.  The patient may have clear liquids until 5am upon the day of the procedure.  3. Labs: You will need to have blood drawn TODAY  4. Medication instructions in preparation for your procedure:   Contrast Allergy: No    Stop taking Xarelto (Rivaroxaban) on Sunday, January 30.  Stop taking, Torsemide (Demadex) Tuesday, January 31,  Take only 57 units of insulin the night before your procedure. Do not take any insulin on the day of the procedure.    On the morning of your procedure, take your Aspirin 81 mg and any morning medicines NOT listed above.  You may use sips of water.  5. Plan for one night stay--bring personal belongings. 6. Bring a current list of your medications and current insurance cards. 7. You MUST have a responsible person to drive you home. 8. Someone MUST be  with you the first 24 hours after you arrive home or your discharge will be delayed. 9. Please wear clothes that are easy to get on and off and wear slip-on shoes.  Thank you for allowing [place] to care for you!   -- [facility] Invasive Cardiovascular services    Signed, Werner Lean, MD  07/14/2021 3:42 PM    [place]

## 2021-07-14 ENCOUNTER — Ambulatory Visit (INDEPENDENT_AMBULATORY_CARE_PROVIDER_SITE_OTHER): Payer: Medicare Other | Admitting: Internal Medicine

## 2021-07-14 ENCOUNTER — Other Ambulatory Visit: Payer: Self-pay

## 2021-07-14 ENCOUNTER — Encounter: Payer: Self-pay | Admitting: Internal Medicine

## 2021-07-14 VITALS — BP 102/60 | HR 69 | Ht 72.0 in | Wt 302.0 lb

## 2021-07-14 DIAGNOSIS — I509 Heart failure, unspecified: Secondary | ICD-10-CM

## 2021-07-14 DIAGNOSIS — G4733 Obstructive sleep apnea (adult) (pediatric): Secondary | ICD-10-CM

## 2021-07-14 DIAGNOSIS — I700 Atherosclerosis of aorta: Secondary | ICD-10-CM | POA: Diagnosis not present

## 2021-07-14 DIAGNOSIS — I2510 Atherosclerotic heart disease of native coronary artery without angina pectoris: Secondary | ICD-10-CM

## 2021-07-14 DIAGNOSIS — Z86711 Personal history of pulmonary embolism: Secondary | ICD-10-CM

## 2021-07-14 DIAGNOSIS — R0609 Other forms of dyspnea: Secondary | ICD-10-CM

## 2021-07-14 NOTE — Patient Instructions (Addendum)
Medication Instructions:  Your physician recommends that you continue on your current medications as directed. Please refer to the Current Medication list given to you today.  *If you need a refill on your cardiac medications before your next appointment, please call your pharmacy*   Lab Work: TODAY: CBC and BMET If you have labs (blood work) drawn today and your tests are completely normal, you will receive your results only by: [place] (if you have MyChart) OR A paper copy in the mail If you have any lab test that is abnormal or we need to change your treatment, we will call you to review the results.   Testing/Procedures: Your physician has requested that you have a cardiac catheterization. Cardiac catheterization is used to diagnose and/or treat various heart conditions. Doctors may recommend this procedure for a number of different reasons. The most common reason is to evaluate chest pain. Chest pain can be a symptom of coronary artery disease (CAD), and cardiac catheterization can show whether plaque is narrowing or blocking your hearts arteries. This procedure is also used to evaluate the valves, as well as measure the blood flow and oxygen levels in different parts of your heart. For further information please visit HugeFiesta.tn. Please follow instruction sheet, as given.    Follow-Up: At [facility], you and your health needs are our priority.  As part of our continuing mission to provide you with exceptional heart care, we have created designated Provider Care Teams.  These Care Teams include your primary Cardiologist (physician) and Advanced Practice Providers (APPs -  Physician Assistants and Nurse Practitioners) who all work together to provide you with the care you need, when you need it.  Your next appointment:   1 month(s)  The format for your next appointment:   In Person  Provider:   Werner Lean, MD     Other Instructions   [place] OFFICE [place], [place] [address] Dept: [phone] Loc: [place]  07/14/2021  You are scheduled for a Cardiac Catheterization on Tuesday, July 18, 2021 with Dr. Larae Grooms.  1. Please arrive at the [facility] (Main Entrance A) at [facility]: [address] [place], [place] [address] at 11:30 AM (This time is two hours before your procedure to ensure your preparation). Free valet parking service is available.   Special note: Every effort is made to have your procedure done on time. Please understand that emergencies sometimes delay scheduled procedures.  2. Diet: Do not eat solid foods after midnight.  The patient may have clear liquids until 5am upon the day of the procedure.  3. Labs: You will need to have blood drawn TODAY  4. Medication instructions in preparation for your procedure:   Contrast Allergy: No    Stop taking Xarelto on Sunday July 16, 2021  Stop taking, Torsemide (Demadex) Tuesday, January 31,  Do not take any insulin the morning of Heart cath 07/18/21    On the morning of your procedure, take your Aspirin 81 mg and any morning medicines NOT listed above.  You may use sips of water.  5. Plan for one night stay--bring personal belongings. 6. Bring a current list of your medications and current insurance cards. 7. You MUST have a responsible person to drive you home. 8. Someone MUST be with you the first 24 hours after you arrive home or your discharge will be delayed.  9. Please wear clothes that are easy to get on and off and wear slip-on shoes.  Thank you for allowing [place] to care for you!   -- [place] Invasive Cardiovascular services

## 2021-07-15 LAB — BASIC METABOLIC PANEL
BUN/Creatinine Ratio: 11 (ref 10–24)
BUN: 13 mg/dL (ref 8–27)
CO2: 25 mmol/L (ref 20–29)
Calcium: 8.8 mg/dL (ref 8.6–10.2)
Chloride: 96 mmol/L (ref 96–106)
Creatinine, Ser: 1.17 mg/dL (ref 0.76–1.27)
Glucose: 231 mg/dL — ABNORMAL HIGH (ref 70–99)
Potassium: 3.9 mmol/L (ref 3.5–5.2)
Sodium: 140 mmol/L (ref 134–144)
eGFR: 69 mL/min/{1.73_m2} (ref >59)

## 2021-07-15 LAB — CBC
Hematocrit: 42.6 % (ref 37.5–51.0)
Hemoglobin: 14.2 g/dL (ref 13.0–17.7)
MCH: 31.7 pg (ref 26.6–33.0)
MCHC: 33.3 g/dL (ref 31.5–35.7)
MCV: 95 fL (ref 79–97)
Platelets: 201 10*3/uL (ref 150–450)
RBC: 4.48 x10E6/uL (ref 4.14–5.80)
RDW: 12.4 % (ref 11.6–15.4)
WBC: 6.2 10*3/uL (ref 3.4–10.8)

## 2021-07-17 ENCOUNTER — Telehealth: Payer: Self-pay | Admitting: Internal Medicine

## 2021-07-17 ENCOUNTER — Telehealth: Payer: Self-pay | Admitting: *Deleted

## 2021-07-17 NOTE — Telephone Encounter (Signed)
Patient scheduled 07/27/21 at 2:30pm with Dr. Chase Caller.

## 2021-07-17 NOTE — Telephone Encounter (Signed)
Called and spoke with pt about getting him scheduled for a f/u with MR. Pt has been scheduled for an appt Thurs. 2/9. Nothing further needed.

## 2021-07-17 NOTE — Telephone Encounter (Signed)
Cardiac catheterization scheduled at [facility] for: Tuesday July 18, 2021 1:30 PM [facility] Main Entrance A [facility]) at: 11:30 AM   Diet-no solid food after midnight prior to cath, clear liquids until 5 AM day of procedure.  Medication instructions for procedure: -Hold:  Xarelto-none 07/16/21 until post procedure  Torsemide-AM of procedure-  Insulin-AM of procedure -Except hold medications usual morning medications can be taken pre-cath with sips of water including aspirin 81 mg.    Confirmed patient has responsible adult to drive home post procedure and be with patient first 24 hours after arriving home.  [facility] does allow one visitor to accompany you and wait in the hospital waiting room while you are there for your procedure. You and your visitor will be asked to wear a mask once you enter the hospital.   Patient reports does not currently have any new symptoms concerning for COVID-19 and no household members with COVID-19 like illness.    Reviewed procedure/mask/visitor instructions with patient.

## 2021-07-17 NOTE — Telephone Encounter (Signed)
See MyChart encounter from 1/25. Pt was advised to schedule first availble with Dr. Chase Caller, but that is not until 3/2. Offered appt with APP, but pt was not interested. He mentioned seeing another provider, and I did advise that we cannot do that unless he wanted to switch providers. Placing message back to see if nurses can schedule with MR in a blocked spot. Please advise.

## 2021-07-17 NOTE — Telephone Encounter (Signed)
Pt given office contact information to schedule OV with Dr. Chase Caller. Nothing further needed at this time.

## 2021-07-18 ENCOUNTER — Ambulatory Visit (HOSPITAL_COMMUNITY)
Admission: RE | Admit: 2021-07-18 | Discharge: 2021-07-19 | Disposition: A | Discharge status: Home / Self Care | Payer: Medicare Other | Attending: Interventional Cardiology | Admitting: Interventional Cardiology

## 2021-07-18 ENCOUNTER — Encounter (HOSPITAL_COMMUNITY)
Admission: RE | Disposition: A | Discharge status: Home / Self Care | Payer: Self-pay | Source: Home / Self Care | Attending: Interventional Cardiology

## 2021-07-18 ENCOUNTER — Other Ambulatory Visit: Payer: Self-pay

## 2021-07-18 DIAGNOSIS — I509 Heart failure, unspecified: Secondary | ICD-10-CM | POA: Insufficient documentation

## 2021-07-18 DIAGNOSIS — E785 Hyperlipidemia, unspecified: Secondary | ICD-10-CM | POA: Diagnosis present

## 2021-07-18 DIAGNOSIS — E1169 Type 2 diabetes mellitus with other specified complication: Secondary | ICD-10-CM | POA: Diagnosis present

## 2021-07-18 DIAGNOSIS — E119 Type 2 diabetes mellitus without complications: Secondary | ICD-10-CM | POA: Diagnosis not present

## 2021-07-18 DIAGNOSIS — I714 Abdominal aortic aneurysm, without rupture, unspecified: Secondary | ICD-10-CM | POA: Diagnosis not present

## 2021-07-18 DIAGNOSIS — I110 Hypertensive heart disease with heart failure: Secondary | ICD-10-CM | POA: Diagnosis not present

## 2021-07-18 DIAGNOSIS — Z79899 Other long term (current) drug therapy: Secondary | ICD-10-CM | POA: Diagnosis not present

## 2021-07-18 DIAGNOSIS — Z87891 Personal history of nicotine dependence: Secondary | ICD-10-CM | POA: Insufficient documentation

## 2021-07-18 DIAGNOSIS — Z8616 Personal history of COVID-19: Secondary | ICD-10-CM | POA: Insufficient documentation

## 2021-07-18 DIAGNOSIS — Z6841 Body Mass Index (BMI) 40.0 and over, adult: Secondary | ICD-10-CM | POA: Diagnosis not present

## 2021-07-18 DIAGNOSIS — Z955 Presence of coronary angioplasty implant and graft: Secondary | ICD-10-CM

## 2021-07-18 DIAGNOSIS — E1159 Type 2 diabetes mellitus with other circulatory complications: Secondary | ICD-10-CM | POA: Diagnosis present

## 2021-07-18 DIAGNOSIS — I2510 Atherosclerotic heart disease of native coronary artery without angina pectoris: Secondary | ICD-10-CM | POA: Diagnosis present

## 2021-07-18 DIAGNOSIS — Z7901 Long term (current) use of anticoagulants: Secondary | ICD-10-CM | POA: Insufficient documentation

## 2021-07-18 DIAGNOSIS — Z86711 Personal history of pulmonary embolism: Secondary | ICD-10-CM | POA: Diagnosis not present

## 2021-07-18 DIAGNOSIS — G4733 Obstructive sleep apnea (adult) (pediatric): Secondary | ICD-10-CM | POA: Insufficient documentation

## 2021-07-18 DIAGNOSIS — I700 Atherosclerosis of aorta: Secondary | ICD-10-CM | POA: Diagnosis not present

## 2021-07-18 DIAGNOSIS — R0602 Shortness of breath: Secondary | ICD-10-CM | POA: Diagnosis not present

## 2021-07-18 DIAGNOSIS — I5030 Unspecified diastolic (congestive) heart failure: Secondary | ICD-10-CM

## 2021-07-18 DIAGNOSIS — I25118 Atherosclerotic heart disease of native coronary artery with other forms of angina pectoris: Secondary | ICD-10-CM | POA: Diagnosis present

## 2021-07-18 DIAGNOSIS — I10 Essential (primary) hypertension: Secondary | ICD-10-CM | POA: Diagnosis present

## 2021-07-18 DIAGNOSIS — Z794 Long term (current) use of insulin: Secondary | ICD-10-CM | POA: Diagnosis not present

## 2021-07-18 HISTORY — PX: CORONARY ATHERECTOMY: CATH118238

## 2021-07-18 HISTORY — PX: CORONARY STENT INTERVENTION: CATH118234

## 2021-07-18 HISTORY — PX: RIGHT/LEFT HEART CATH AND CORONARY ANGIOGRAPHY: CATH118266

## 2021-07-18 LAB — GLUCOSE, CAPILLARY
Glucose-Capillary: 164 mg/dL — ABNORMAL HIGH (ref 70–99)
Glucose-Capillary: 131 mg/dL — ABNORMAL HIGH (ref 70–99)

## 2021-07-18 LAB — POCT I-STAT 7, (LYTES, BLD GAS, ICA,H+H)
Acid-Base Excess: 4 mmol/L — ABNORMAL HIGH (ref 0.0–2.0)
Bicarbonate: 28.9 mmol/L — ABNORMAL HIGH (ref 20.0–28.0)
Calcium, Ion: 1.12 mmol/L — ABNORMAL LOW (ref 1.15–1.40)
HCT: 38 % — ABNORMAL LOW (ref 39.0–52.0)
Hemoglobin: 12.9 g/dL — ABNORMAL LOW (ref 13.0–17.0)
O2 Saturation: 94 %
Potassium: 3.6 mmol/L (ref 3.5–5.1)
Sodium: 140 mmol/L (ref 135–145)
TCO2: 30 mmol/L (ref 22–32)
pCO2 arterial: 45.3 mmHg (ref 32.0–48.0)
pH, Arterial: 7.413 (ref 7.350–7.450)
pO2, Arterial: 70 mmHg — ABNORMAL LOW (ref 83.0–108.0)

## 2021-07-18 LAB — POCT I-STAT EG7
Acid-Base Excess: 5 mmol/L — ABNORMAL HIGH (ref 0.0–2.0)
Acid-Base Excess: 5 mmol/L — ABNORMAL HIGH (ref 0.0–2.0)
Bicarbonate: 30.3 mmol/L — ABNORMAL HIGH (ref 20.0–28.0)
Bicarbonate: 30.4 mmol/L — ABNORMAL HIGH (ref 20.0–28.0)
Calcium, Ion: 1.16 mmol/L (ref 1.15–1.40)
Calcium, Ion: 1.15 mmol/L (ref 1.15–1.40)
HCT: 39 % (ref 39.0–52.0)
HCT: 39 % (ref 39.0–52.0)
Hemoglobin: 13.3 g/dL (ref 13.0–17.0)
Hemoglobin: 13.3 g/dL (ref 13.0–17.0)
O2 Saturation: 73 %
O2 Saturation: 75 %
Potassium: 3.7 mmol/L (ref 3.5–5.1)
Potassium: 3.7 mmol/L (ref 3.5–5.1)
Sodium: 139 mmol/L (ref 135–145)
Sodium: 139 mmol/L (ref 135–145)
TCO2: 32 mmol/L (ref 22–32)
TCO2: 32 mmol/L (ref 22–32)
pCO2, Ven: 48.4 mmHg (ref 44.0–60.0)
pCO2, Ven: 47.8 mmHg (ref 44.0–60.0)
pH, Ven: 7.404 (ref 7.250–7.430)
pH, Ven: 7.411 (ref 7.250–7.430)
pO2, Ven: 39 mmHg (ref 32.0–45.0)
pO2, Ven: 40 mmHg (ref 32.0–45.0)

## 2021-07-18 LAB — POCT ACTIVATED CLOTTING TIME
Activated Clotting Time: 287 seconds
Activated Clotting Time: 299 seconds
Activated Clotting Time: 275 seconds

## 2021-07-18 SURGERY — RIGHT/LEFT HEART CATH AND CORONARY ANGIOGRAPHY
Anesthesia: LOCAL

## 2021-07-18 MED ORDER — IOHEXOL 350 MG/ML SOLN
INTRAVENOUS | Status: DC | PRN
  Administered 2021-07-18: 130 mL

## 2021-07-18 MED ORDER — NITROGLYCERIN 1 MG/10 ML FOR IR/CATH LAB
INTRA_ARTERIAL | Status: DC | PRN
  Administered 2021-07-18: 500 ug via INTRA_ARTERIAL
  Administered 2021-07-18: 200 ug via INTRACORONARY

## 2021-07-18 MED ORDER — HEPARIN SODIUM (PORCINE) 1000 UNIT/ML IJ SOLN
INTRAMUSCULAR | Status: AC
  Filled 2021-07-18: qty 10

## 2021-07-18 MED ORDER — FENTANYL CITRATE (PF) 100 MCG/2ML IJ SOLN
INTRAMUSCULAR | Status: DC | PRN
  Administered 2021-07-18 (×5): 25 ug via INTRAVENOUS

## 2021-07-18 MED ORDER — NEOMYCIN-POLYMYXIN-HC 1 % OT SOLN: 2.0000 [drp] | Freq: Four times a day (QID) | OTIC | Status: DC | PRN

## 2021-07-18 MED ORDER — LISINOPRIL 20 MG PO TABS
40.0000 mg | ORAL_TABLET | Freq: Every morning | ORAL | Status: DC
  Administered 2021-07-19: 40 mg via ORAL
  Filled 2021-07-18: qty 2

## 2021-07-18 MED ORDER — VERAPAMIL HCL 2.5 MG/ML IV SOLN: INTRAVENOUS | Status: DC | PRN

## 2021-07-18 MED ORDER — NITROGLYCERIN 1 MG/10 ML FOR IR/CATH LAB
INTRA_ARTERIAL | Status: AC
  Filled 2021-07-18: qty 10

## 2021-07-18 MED ORDER — VERAPAMIL HCL 2.5 MG/ML IV SOLN
INTRAVENOUS | Status: AC
  Filled 2021-07-18: qty 2

## 2021-07-18 MED ORDER — HEPARIN SODIUM (PORCINE) 1000 UNIT/ML IJ SOLN
INTRAMUSCULAR | Status: DC | PRN
  Administered 2021-07-18: 3000 [IU] via INTRAVENOUS
  Administered 2021-07-18: 2000 [IU] via INTRAVENOUS
  Administered 2021-07-18: 6000 [IU] via INTRAVENOUS
  Administered 2021-07-18: 8000 [IU] via INTRAVENOUS

## 2021-07-18 MED ORDER — MIDAZOLAM HCL 2 MG/2ML IJ SOLN
INTRAMUSCULAR | Status: DC | PRN
  Administered 2021-07-18: 1 mg via INTRAVENOUS
  Administered 2021-07-18: 2 mg via INTRAVENOUS
  Administered 2021-07-18 (×3): 1 mg via INTRAVENOUS

## 2021-07-18 MED ORDER — CLOPIDOGREL BISULFATE 300 MG PO TABS
ORAL_TABLET | ORAL | Status: AC
  Filled 2021-07-18: qty 2

## 2021-07-18 MED ORDER — FLUTICASONE PROPIONATE 50 MCG/ACT NA SUSP: 2.0000 | Freq: Every day | NASAL | Status: DC | PRN

## 2021-07-18 MED ORDER — SODIUM CHLORIDE 0.9 % IV SOLN: INTRAVENOUS | Status: AC

## 2021-07-18 MED ORDER — ROSUVASTATIN CALCIUM 20 MG PO TABS
20.0000 mg | ORAL_TABLET | Freq: Every day | ORAL | Status: DC
  Administered 2021-07-19: 20 mg via ORAL
  Filled 2021-07-18: qty 1

## 2021-07-18 MED ORDER — ASPIRIN 81 MG PO CHEW: 81.0000 mg | CHEWABLE_TABLET | ORAL | Status: DC

## 2021-07-18 MED ORDER — INSULIN DEGLUDEC 100 UNIT/ML ~~LOC~~ SOPN
114.0000 [IU] | PEN_INJECTOR | Freq: Every day | SUBCUTANEOUS | Status: DC
  Administered 2021-07-19: 114 [IU] via SUBCUTANEOUS

## 2021-07-18 MED ORDER — LABETALOL HCL 5 MG/ML IV SOLN: 10.0000 mg | INTRAVENOUS | Status: AC | PRN

## 2021-07-18 MED ORDER — LIDOCAINE HCL (PF) 1 % IJ SOLN
INTRAMUSCULAR | Status: DC | PRN
  Administered 2021-07-18 (×2): 2 mL

## 2021-07-18 MED ORDER — FENTANYL CITRATE (PF) 100 MCG/2ML IJ SOLN
INTRAMUSCULAR | Status: AC
  Filled 2021-07-18: qty 2

## 2021-07-18 MED ORDER — HYDRALAZINE HCL 20 MG/ML IJ SOLN: 10.0000 mg | INTRAMUSCULAR | Status: AC | PRN

## 2021-07-18 MED ORDER — HEPARIN (PORCINE) IN NACL 1000-0.9 UT/500ML-% IV SOLN
INTRAVENOUS | Status: AC
  Filled 2021-07-18: qty 1000

## 2021-07-18 MED ORDER — DIPHENHYDRAMINE HCL 25 MG PO CAPS: 25.0000 mg | ORAL_CAPSULE | Freq: Four times a day (QID) | ORAL | Status: DC | PRN

## 2021-07-18 MED ORDER — INSULIN DEGLUDEC 200 UNIT/ML ~~LOC~~ SOPN: PEN_INJECTOR | Freq: Every morning | SUBCUTANEOUS | Status: DC

## 2021-07-18 MED ORDER — MIDAZOLAM HCL 2 MG/2ML IJ SOLN
INTRAMUSCULAR | Status: AC
  Filled 2021-07-18: qty 2

## 2021-07-18 MED ORDER — LIDOCAINE HCL (PF) 1 % IJ SOLN
INTRAMUSCULAR | Status: AC
  Filled 2021-07-18: qty 30

## 2021-07-18 MED ORDER — SODIUM CHLORIDE 0.9% FLUSH
3.0000 mL | INTRAVENOUS | Status: DC | PRN
Start: 2021-07-18 — End: 2021-07-19

## 2021-07-18 MED ORDER — SODIUM CHLORIDE 0.9 % IV SOLN: INTRAVENOUS | Status: DC

## 2021-07-18 MED ORDER — ASPIRIN 81 MG PO CHEW
81.0000 mg | CHEWABLE_TABLET | Freq: Every day | ORAL | Status: DC
  Administered 2021-07-18 – 2021-07-19 (×2): 81 mg via ORAL
  Filled 2021-07-18 (×2): qty 1

## 2021-07-18 MED ORDER — ADULT MULTIVITAMIN W/MINERALS CH
1.0000 | ORAL_TABLET | Freq: Every morning | ORAL | Status: DC
  Administered 2021-07-19: 1 via ORAL
  Filled 2021-07-18: qty 1

## 2021-07-18 MED ORDER — ACETAMINOPHEN 500 MG PO TABS: 500.0000 mg | ORAL_TABLET | Freq: Four times a day (QID) | ORAL | Status: DC | PRN

## 2021-07-18 MED ORDER — SODIUM CHLORIDE 0.9% FLUSH
3.0000 mL | Freq: Two times a day (BID) | INTRAVENOUS | Status: DC
  Administered 2021-07-18 – 2021-07-19 (×2): 3 mL via INTRAVENOUS

## 2021-07-18 MED ORDER — CLOPIDOGREL BISULFATE 75 MG PO TABS
75.0000 mg | ORAL_TABLET | Freq: Every day | ORAL | Status: DC
  Administered 2021-07-19: 75 mg via ORAL
  Filled 2021-07-18: qty 1

## 2021-07-18 MED ORDER — VIPERSLIDE LUBRICANT OPTIME: TOPICAL | Status: DC | PRN

## 2021-07-18 MED ORDER — SODIUM CHLORIDE 0.9% FLUSH: 3.0000 mL | Freq: Two times a day (BID) | INTRAVENOUS | Status: DC

## 2021-07-18 MED ORDER — CLOPIDOGREL BISULFATE 300 MG PO TABS
ORAL_TABLET | ORAL | Status: DC | PRN
  Administered 2021-07-18: 600 mg via ORAL

## 2021-07-18 MED ORDER — SODIUM CHLORIDE 0.9% FLUSH: 3.0000 mL | INTRAVENOUS | Status: DC | PRN

## 2021-07-18 MED ORDER — ONDANSETRON HCL 4 MG/2ML IJ SOLN: 4.0000 mg | Freq: Four times a day (QID) | INTRAMUSCULAR | Status: DC | PRN

## 2021-07-18 MED ORDER — HEPARIN (PORCINE) IN NACL 1000-0.9 UT/500ML-% IV SOLN
INTRAVENOUS | Status: DC | PRN
  Administered 2021-07-18 (×2): 500 mL

## 2021-07-18 MED ORDER — ACETAMINOPHEN 325 MG PO TABS
650.0000 mg | ORAL_TABLET | ORAL | Status: DC | PRN
  Administered 2021-07-18: 650 mg via ORAL
  Filled 2021-07-18: qty 2

## 2021-07-18 MED ORDER — TAMSULOSIN HCL 0.4 MG PO CAPS
0.4000 mg | ORAL_CAPSULE | Freq: Every morning | ORAL | Status: DC
  Administered 2021-07-19: 0.4 mg via ORAL
  Filled 2021-07-18 (×2): qty 1

## 2021-07-18 MED ORDER — SODIUM CHLORIDE 0.9 % IV SOLN: 250.0000 mL | INTRAVENOUS | Status: DC | PRN

## 2021-07-18 MED ORDER — DILTIAZEM HCL ER COATED BEADS 180 MG PO CP24
300.0000 mg | ORAL_CAPSULE | Freq: Every morning | ORAL | Status: DC
  Administered 2021-07-19: 300 mg via ORAL
  Filled 2021-07-18 (×2): qty 1

## 2021-07-18 SURGICAL SUPPLY — 31 items
BALLN  ~~LOC~~ SAPPHIRE 4.5X12 (BALLOONS) ×2
BALLN SCOREFLEX 3.50X15 (BALLOONS) ×2
BALLN ~~LOC~~ SAPPHIRE 4.5X12 (BALLOONS) ×1
BALLOON SCOREFLEX 3.50X15 (BALLOONS) IMPLANT
BALLOON ~~LOC~~ SAPPHIRE 4.5X12 (BALLOONS) IMPLANT
CABLE ADAPT PACING TEMP 12FT (ADAPTER) ×1 IMPLANT
CATH 5FR JL3.5 JR4 ANG PIG MP (CATHETERS) ×1 IMPLANT
CATH BALLN WEDGE 5F 110CM (CATHETERS) ×1 IMPLANT
CATH OPTICROSS HD (CATHETERS) ×1 IMPLANT
CATH S G BIP PACING (CATHETERS) ×1 IMPLANT
CATH VISTA GUIDE 6FR XBRCA (CATHETERS) ×1 IMPLANT
CROWN DIAMONDBACK CLASSIC 1.25 (BURR) ×1 IMPLANT
DEVICE RAD COMP TR BAND LRG (VASCULAR PRODUCTS) ×1 IMPLANT
ELECT DEFIB PAD ADLT CADENCE (PAD) ×1 IMPLANT
GLIDESHEATH SLEND SS 6F .021 (SHEATH) ×2 IMPLANT
GUIDEWIRE INQWIRE 1.5J.035X260 (WIRE) IMPLANT
INQWIRE 1.5J .035X260CM (WIRE) ×2
KIT ENCORE 26 ADVANTAGE (KITS) ×1 IMPLANT
KIT HEART LEFT (KITS) ×2 IMPLANT
KIT HEMO VALVE WATCHDOG (MISCELLANEOUS) ×1 IMPLANT
LUBRICANT VIPERSLIDE CORONARY (MISCELLANEOUS) ×1 IMPLANT
PACK CARDIAC CATHETERIZATION (CUSTOM PROCEDURE TRAY) ×2 IMPLANT
SHEATH GLIDE SLENDER 4/5FR (SHEATH) ×1 IMPLANT
SHEATH PROBE COVER 6X72 (BAG) ×1 IMPLANT
SLED PULL BACK IVUS (MISCELLANEOUS) ×1 IMPLANT
STENT ONYX FRONTIER 4.0X15 (Permanent Stent) ×1 IMPLANT
STENT ONYX FRONTIER 4.0X34 (Permanent Stent) ×1 IMPLANT
TRANSDUCER W/STOPCOCK (MISCELLANEOUS) ×2 IMPLANT
TUBING CIL FLEX 10 FLL-RA (TUBING) ×2 IMPLANT
WIRE ASAHI PROWATER 180CM (WIRE) ×1 IMPLANT
WIRE VIPERWIRE COR FLEX .012 (WIRE) ×1 IMPLANT

## 2021-07-18 NOTE — Interval H&P Note (Signed)
Cath Lab Visit (complete for each Cath Lab visit)  Clinical Evaluation Leading to the Procedure:   ACS: No.  Non-ACS:    Anginal Classification: CCS III  Anti-ischemic medical therapy: Minimal Therapy (1 class of medications)  Non-Invasive Test Results: No non-invasive testing performed  Prior CABG: No previous CABG      History and Physical Interval Note:  07/18/2021 12:32 PM  [name]  has presented today for surgery, with the diagnosis of cad.  The various methods of treatment have been discussed with the patient and family. After consideration of risks, benefits and other options for treatment, the patient has consented to  Procedure(s): RIGHT/LEFT HEART CATH AND CORONARY ANGIOGRAPHY (N/A) as a surgical intervention.  The patient's history has been reviewed, patient examined, no change in status, stable for surgery.  I have reviewed the patient's chart and labs.  Questions were answered to the patient's satisfaction.     Larae Grooms

## 2021-07-19 ENCOUNTER — Other Ambulatory Visit (HOSPITAL_COMMUNITY): Payer: Self-pay

## 2021-07-19 ENCOUNTER — Encounter (HOSPITAL_COMMUNITY): Payer: Self-pay | Admitting: Interventional Cardiology

## 2021-07-19 DIAGNOSIS — G4733 Obstructive sleep apnea (adult) (pediatric): Secondary | ICD-10-CM

## 2021-07-19 DIAGNOSIS — I10 Essential (primary) hypertension: Secondary | ICD-10-CM

## 2021-07-19 DIAGNOSIS — I2510 Atherosclerotic heart disease of native coronary artery without angina pectoris: Secondary | ICD-10-CM | POA: Diagnosis not present

## 2021-07-19 DIAGNOSIS — I5032 Chronic diastolic (congestive) heart failure: Secondary | ICD-10-CM | POA: Diagnosis not present

## 2021-07-19 DIAGNOSIS — I714 Abdominal aortic aneurysm, without rupture, unspecified: Secondary | ICD-10-CM | POA: Diagnosis not present

## 2021-07-19 DIAGNOSIS — I25118 Atherosclerotic heart disease of native coronary artery with other forms of angina pectoris: Secondary | ICD-10-CM | POA: Diagnosis not present

## 2021-07-19 DIAGNOSIS — E119 Type 2 diabetes mellitus without complications: Secondary | ICD-10-CM | POA: Diagnosis not present

## 2021-07-19 LAB — CBC
HCT: 39.6 % (ref 39.0–52.0)
Hemoglobin: 13.6 g/dL (ref 13.0–17.0)
MCH: 32.9 pg (ref 26.0–34.0)
MCHC: 34.3 g/dL (ref 30.0–36.0)
MCV: 95.9 fL (ref 80.0–100.0)
Platelets: 164 10*3/uL (ref 150–400)
RBC: 4.13 MIL/uL — ABNORMAL LOW (ref 4.22–5.81)
RDW: 13.2 % (ref 11.5–15.5)
WBC: 5.2 10*3/uL (ref 4.0–10.5)
nRBC: 0 % (ref 0.0–0.2)

## 2021-07-19 LAB — BASIC METABOLIC PANEL
Anion gap: 8 (ref 5–15)
BUN: 8 mg/dL (ref 8–23)
CO2: 26 mmol/L (ref 22–32)
Calcium: 8.3 mg/dL — ABNORMAL LOW (ref 8.9–10.3)
Chloride: 103 mmol/L (ref 98–111)
Creatinine, Ser: 1.08 mg/dL (ref 0.61–1.24)
GFR, Estimated: >60 mL/min (ref >60)
Glucose, Bld: 179 mg/dL — ABNORMAL HIGH (ref 70–99)
Potassium: 3.5 mmol/L (ref 3.5–5.1)
Sodium: 137 mmol/L (ref 135–145)

## 2021-07-19 MED ORDER — METOPROLOL TARTRATE 12.5 MG HALF TABLET
12.5000 mg | ORAL_TABLET | Freq: Two times a day (BID) | ORAL | Status: DC
  Administered 2021-07-19: 12.5 mg via ORAL
  Filled 2021-07-19: qty 1

## 2021-07-19 MED ORDER — ASPIRIN 81 MG PO CHEW
81.0000 mg | CHEWABLE_TABLET | Freq: Every day | ORAL | 0 refills | Status: DC
  Filled 2021-07-19: qty 30, 30d supply, fill #0

## 2021-07-19 MED ORDER — METOPROLOL TARTRATE 25 MG PO TABS
12.5000 mg | ORAL_TABLET | Freq: Two times a day (BID) | ORAL | 3 refills | Status: DC
  Filled 2021-07-19: qty 90, 90d supply, fill #0

## 2021-07-19 MED ORDER — ROSUVASTATIN CALCIUM 20 MG PO TABS: 40.0000 mg | ORAL_TABLET | Freq: Every day | ORAL | Status: DC

## 2021-07-19 MED ORDER — ROSUVASTATIN CALCIUM 40 MG PO TABS
40.0000 mg | ORAL_TABLET | Freq: Every day | ORAL | 3 refills | Status: DC
  Filled 2021-07-19: qty 90, 90d supply, fill #0

## 2021-07-19 MED ORDER — CLOPIDOGREL BISULFATE 75 MG PO TABS
75.0000 mg | ORAL_TABLET | Freq: Every day | ORAL | 3 refills | Status: DC
  Filled 2021-07-19: qty 90, 90d supply, fill #0
  Filled 2021-10-04: qty 90, 90d supply, fill #1

## 2021-07-19 NOTE — Care Management (Signed)
°  Transition of Care [facility]) Screening Note   Patient Details  Name: ZAKARI BATHE Date of Birth: 1955-09-09   Transition of Care [facility]) CM/SW Contact:    Bethena Roys, RN Phone Number: 07/19/2021, 10:20 AM    Transition of Care Department [facility]) has reviewed the patient and no TOC needs have been identified at this time. We will continue to monitor patient advancement through interdisciplinary progression rounds. If new patient transition needs arise, please place a TOC consult.

## 2021-07-19 NOTE — Plan of Care (Signed)
No issues overnight. No complaints of pain, no bleeding or complications from cath sites. Vitals stable. Sleeping well with home cpap. Anticipates discharge today.  Problem: Education: Goal: Knowledge of General Education information will improve Description: Including pain rating scale, medication(s)/side effects and non-pharmacologic comfort measures Outcome: Progressing   Problem: Health Behavior/Discharge Planning: Goal: Ability to manage health-related needs will improve Outcome: Progressing   Problem: Clinical Measurements: Goal: Ability to maintain clinical measurements within normal limits will improve Outcome: Progressing Goal: Will remain free from infection Outcome: Progressing Goal: Diagnostic test results will improve Outcome: Progressing Goal: Respiratory complications will improve Outcome: Progressing Goal: Cardiovascular complication will be avoided Outcome: Progressing   Problem: Activity: Goal: Risk for activity intolerance will decrease Outcome: Progressing   Problem: Nutrition: Goal: Adequate nutrition will be maintained Outcome: Progressing   Problem: Coping: Goal: Level of anxiety will decrease Outcome: Progressing   Problem: Elimination: Goal: Will not experience complications related to bowel motility Outcome: Progressing Goal: Will not experience complications related to urinary retention Outcome: Progressing   Problem: Pain Managment: Goal: General experience of comfort will improve Outcome: Progressing   Problem: Safety: Goal: Ability to remain free from injury will improve Outcome: Progressing   Problem: Skin Integrity: Goal: Risk for impaired skin integrity will decrease Outcome: Progressing

## 2021-07-19 NOTE — Discharge Summary (Signed)
Discharge Summary    Patient ID: [name] MRN: [id number]; DOB: [birth year]  Admit date: 07/18/2021 Discharge date: 07/19/2021  PCP:  [name], MD   [facility] Providers Cardiologist:  Werner Lean, MD   {   Discharge Diagnoses    Principal Problem:   CAD in native artery Active Problems:   Hypertension   Hyperlipidemia   BMI 40.0-44.9, adult (HCC)   OSA (obstructive sleep apnea)   (HFpEF) heart failure with preserved ejection fraction [facility])    Diagnostic Studies/Procedures    Right/Left Heart Cath 07/18/2021    Prox RCA to Mid RCA lesion is 90% stenosed.  Heavily calcified lesion.   After orbital atherectomy, A drug-eluting stent was successfully placed using a STENT ONYX FRONTIER 4.0X34 and postdilated to 4.5 mm, optimized with intravascular ultrasound.   Post intervention, there is a 0% residual stenosis.   Mid RCA lesion is 75% stenosed.   A drug-eluting stent was successfully placed using a STENT ONYX FRONTIER 4.0X15, postdilated to 4.5 mm and optimized with intravascular ultrasound.   Post intervention, there is a 0% residual stenosis.   The left ventricular systolic function is normal.   LV end diastolic pressure is normal.   The left ventricular ejection fraction is 55-65% by visual estimate.   There is no aortic valve stenosis.   Ao sat 94%, PA sat 74%, PA pressure 25 of 10, mean PA pressure 14 mmHg, mean pulmonary capillary wedge pressure 9 mmHg, cardiac output 8.6 L/min, cardiac index 3.4.   Continue aggressive secondary prevention.  Normal right heart pressures.  I suspect his shortness of breath is likely multifactorial from coronary ischemia as well as obesity and deconditioning.  Weight loss will be beneficial.  Plan for aspirin therapy for 1 month.  Minimum clopidogrel for 6 months.  Okay to restart Xarelto tomorrow.  Would consider treating with clopidogrel for 12 months if the he has no bleeding problems with the combination of  Xarelto.   Diagnostic Dominance: Right Intervention   _____________   History of Present Illness     [name] is a 67 y.o. male with a hx of mild non obstructive CAD, AAA,Morbid Obesity, DM, and HTN; OSA on CPAP COVID-19 2021, history of PE on Xarelto who presented for right/left heart cath on 07/18/2021.   Per chart review, patient underwent a L/R heart cath in 2011 that showed mild obstructive disease. Patient's most recent ischemic evaluation as completed on 11/08/2015 and was a low risk study with overall normal blood flow. Most recent echocardiogram was 02/03/2021 and shwoed LVEF 60-65%, moderate LVH, grade I diastolic dysfunction.   Patient was last seen in the office on 07/14/2018. At that appointment, Patient noted that he was doing better:  he was talking torsemide 40 mg PO BID and had diarrhea from his antibiotics: with this felt dizzy and weak.  The diarrhea is persisting but with torsemide 40 mg dizziness has resolved, leg swelling has improved but not resolved.  His hematologist is getting DOAC changed. His K 04/25/21 was 3.5 and he has never had hyperkalemia (he is on 40 mg PO daily). No chest pain or pressure .  SOB has slightly imrproved and no PND/Orthopnea.  No weight gain.  No palpitations or syncope.As patient noted significant response to his diuretics and had not experienced improvement in SOB, recommended RHC and LHC.   Hospital Course     Consultants: None    CAD - Mild, nonobstructive disease in 2011  -  Patient has been experiencing SOB that was not improved with diuretics, pulmonology evaluation - Presented for R/L heart cath on 07/18/21. Cath findings included prox RCA to mid RCA lesion that was 90% stenosed and heavily calcified that was treated with orbital atherectomy followed by DES placement. There was also a mid RCA lesion that was 75% stenosed and treated with DES.  - Patient is on Xarelto per oncology, OK to continue - Plan for ASA therapy for 1 month,  clopidogrel for 6 months. Will consider treating with clopidogrel for 12 months if patient does not have bleeding problems with xarelto  - Continue diltiazem - Add metoprolol 12.5 BID. Will monitor heart rate at outpatient follow up, unlikely to become bradycardic at a low dose. If bradycardia occurs, can consider decreasing/stopping dilt as patient will have more benefit from BB therapy.  - Increase crestor to 40 mg daily. Will need LFTs, lipid panel in 8 weeks   HFpEF - Echo on 02/03/2021 and shwoed LVEF 60-65%, moderate LVH, grade I diastolic dysfunction. - Cath on 1/31 showed normal right heart pressures, LVEF 55-65%. CHF no present  - Euvolemic on exam, continue home dose of torsemide   - Continue lisinopril   Unprovoked RLL Segmental Pulmonary Embolism - Managed per hematology outpatient   - On Xarelto indefinitely   HLD  - Given significant RCA disease with DES placement, will increase crestor to 40 mg daily   Did the patient have an acute coronary syndrome (MI, NSTEMI, STEMI, etc) this admission?:  No                               Did the patient have a percutaneous coronary intervention (stent / angioplasty)?:  Yes.     Cath/PCI Registry Performance & Quality Measures: Aspirin prescribed? - Yes ADP Receptor Inhibitor (Plavix/Clopidogrel, Brilinta/Ticagrelor or Effient/Prasugrel) prescribed (includes medically managed patients)? - Yes High Intensity Statin (Lipitor 40-80mg  or Crestor 20-40mg ) prescribed? - Yes For EF <40%, was ACEI/ARB prescribed? - Not Applicable (EF >/= 82%) For EF <40%, Aldosterone Antagonist (Spironolactone or Eplerenone) prescribed? - Not Applicable (EF >/= 95%) Cardiac Rehab Phase II ordered? - Yes       The patient will be scheduled for a TOC follow up appointment in 7-14 days.  A message has been sent to the [facility] and Scheduling Pool at the office where the patient should be seen for follow up.  _____________  Discharge Vitals Blood pressure  140/77, pulse 72, temperature 98.5 F (36.9 C), temperature source Oral, resp. rate 18, height 6' (1.829 m), weight 133.8 kg, SpO2 97 %.  Filed Weights   07/18/21 1116  Weight: 133.8 kg    Labs & Radiologic Studies    CBC Recent Labs    07/18/21 1258 07/19/21 0324  WBC  --  5.2  HGB 13.3 13.6  HCT 39.0 39.6  MCV  --  95.9  PLT  --  621   Basic Metabolic Panel Recent Labs    07/18/21 1258 07/19/21 0324  NA 139 137  K 3.7 3.5  CL  --  103  CO2  --  26  GLUCOSE  --  179*  BUN  --  8  CREATININE  --  1.08  CALCIUM  --  8.3*   Liver Function Tests No results for input(s): AST, ALT, ALKPHOS, BILITOT, PROT, ALBUMIN in the last 72 hours. No results for input(s): LIPASE, AMYLASE in the last 72 hours.  High Sensitivity Troponin:   No results for input(s): TROPONINIHS in the last 720 hours.  BNP Invalid input(s): POCBNP D-Dimer No results for input(s): DDIMER in the last 72 hours. Hemoglobin A1C No results for input(s): HGBA1C in the last 72 hours. Fasting Lipid Panel No results for input(s): CHOL, HDL, LDLCALC, TRIG, CHOLHDL, LDLDIRECT in the last 72 hours. Thyroid Function Tests No results for input(s): TSH, T4TOTAL, T3FREE, THYROIDAB in the last 72 hours.  Invalid input(s): FREET3 _____________  CARDIAC CATHETERIZATION  Result Date: 07/18/2021   Prox RCA to Mid RCA lesion is 90% stenosed.  Heavily calcified lesion.   After orbital atherectomy, A drug-eluting stent was successfully placed using a STENT ONYX FRONTIER 4.0X34 and postdilated to 4.5 mm, optimized with intravascular ultrasound.   Post intervention, there is a 0% residual stenosis.   Mid RCA lesion is 75% stenosed.   A drug-eluting stent was successfully placed using a STENT ONYX FRONTIER 4.0X15, postdilated to 4.5 mm and optimized with intravascular ultrasound.   Post intervention, there is a 0% residual stenosis.   The left ventricular systolic function is normal.   LV end diastolic pressure is normal.   The  left ventricular ejection fraction is 55-65% by visual estimate.   There is no aortic valve stenosis.   Ao sat 94%, PA sat 74%, PA pressure 25 of 10, mean PA pressure 14 mmHg, mean pulmonary capillary wedge pressure 9 mmHg, cardiac output 8.6 L/min, cardiac index 3.4. Continue aggressive secondary prevention.  Normal right heart pressures.  I suspect his shortness of breath is likely multifactorial from coronary ischemia as well as obesity and deconditioning.  Weight loss will be beneficial.  Plan for aspirin therapy for 1 month.  Minimum clopidogrel for 6 months.  Okay to restart Xarelto tomorrow.  Would consider treating with clopidogrel for 12 months if the he has no bleeding problems with the combination of Xarelto. Results conveyed to his wife Cecille Rubin [phone]   Disposition   Pt is being discharged home today in good condition.  Follow-up Plans & Appointments     Follow-up Information     Imogene Burn, PA-C Follow up on 08/08/2021.   Specialty: Cardiology Why: Appointment at 1:15 PM Contact information: [address] [place] [id number] [phone]                Discharge Instructions     AMB Referral to Cardiac Rehabilitation - Phase II   Complete by: As directed    Diagnosis: Coronary Stents   After initial evaluation and assessments completed: Virtual Based Care may be provided alone or in conjunction with Phase 2 Cardiac Rehab based on patient barriers.: Yes       Discharge Medications   Allergies as of 07/19/2021       Reactions   Farxiga [dapagliflozin] Diarrhea, Itching   Erythromycin Nausea Only   Penicillins Nausea Only   Upset stomach   Quercus Robur Itching        Medication List     TAKE these medications    acetaminophen 500 MG tablet Commonly known as: TYLENOL Take 500 mg by mouth every 6 (six) hours as needed (for pain.).   aspirin 81 MG chewable tablet Chew 1 tablet (81 mg total) by mouth daily. Start taking on:  July 20, 2021   clopidogrel 75 MG tablet Commonly known as: PLAVIX Take 1 tablet (75 mg total) by mouth daily with breakfast. Start taking on: July 20, 2021   diltiazem 300  MG 24 hr capsule Commonly known as: CARDIZEM CD Take 300 mg by mouth in the morning.   diphenhydrAMINE 25 MG tablet Commonly known as: BENADRYL Take 25 mg by mouth every 6 (six) hours as needed for itching.   fluticasone 50 MCG/ACT nasal spray Commonly known as: FLONASE Place 2 sprays into both nostrils daily. What changed:  when to take this reasons to take this   glucose blood test strip Commonly known as: TRUEtest Test Use as instructed   Insulin Pen Needle 31G X 5 MM Misc Commonly known as: B-D UF III MINI PEN NEEDLES USE AS DIRECTED ONCE A DAY   lisinopril 40 MG tablet Commonly known as: ZESTRIL Take 40 mg by mouth in the morning.   metoprolol tartrate 25 MG tablet Commonly known as: LOPRESSOR Take 0.5 tablets (12.5 mg total) by mouth 2 (two) times daily.   multivitamin with minerals Tabs tablet Take 1 tablet by mouth in the morning.   NEOMYCIN-POLYMYXIN-HYDROCORTISONE 1 % Soln OTIC solution Commonly known as: CORTISPORIN Place 2 drops into the left ear every 6 (six) hours as needed (ear irritation/discomfort).   Potassium Chloride ER 20 MEQ Tbcr Take 60 mEq by mouth daily. What changed:  how much to take when to take this   rosuvastatin 40 MG tablet Commonly known as: CRESTOR Take 1 tablet (40 mg total) by mouth daily. Start taking on: July 20, 2021 What changed:  medication strength how much to take   silver sulfADIAZINE 1 % cream Commonly known as: Silvadene Apply 1 application topically daily.   tamsulosin 0.4 MG Caps capsule Commonly known as: FLOMAX Take 1 capsule (0.4 mg total) by mouth daily after supper. What changed: when to take this   torsemide 20 MG tablet Commonly known as: Demadex Take 2 tablets (40 mg total) by mouth daily.   Tyler Aas FlexTouch  200 UNIT/ML FlexTouch Pen Generic drug: insulin degludec Inject 114 Units into the skin in the morning.   Xarelto 20 MG Tabs tablet Generic drug: rivaroxaban Take 20 mg by mouth in the morning.           Outstanding Labs/Studies   LFTs, lipid panel in 8 weeks   Duration of Discharge Encounter   Greater than 30 minutes including physician time.  Signed, Margie Billet, PA-C 07/19/2021, 10:21 AM

## 2021-07-19 NOTE — Progress Notes (Signed)
CARDIAC REHAB PHASE I   PRE:  Rate/Rhythm: 63 SR    BP: sitting 123/70    SaO2: 95 RA  MODE:  Ambulation: 470 ft   POST:  Rate/Rhythm: 83 SR    BP: sitting 130/66     SaO2: 93 RA  SOB toward end, VSS. Sts his SOB is different, he thinks better. Discussed with pt and wife stent, Plavix, restrictions, diet, exercise, and CRPII. Receptive. Will refer to [place]. He does have a foot wound that limits exercise at times.  Eager to do CRPII.  [id number]   [place], ACSM 07/19/2021 12:46 PM

## 2021-07-20 ENCOUNTER — Other Ambulatory Visit: Payer: Self-pay

## 2021-07-20 ENCOUNTER — Ambulatory Visit (INDEPENDENT_AMBULATORY_CARE_PROVIDER_SITE_OTHER): Payer: Medicare Other | Admitting: Podiatry

## 2021-07-20 DIAGNOSIS — E11621 Type 2 diabetes mellitus with foot ulcer: Secondary | ICD-10-CM | POA: Diagnosis not present

## 2021-07-20 DIAGNOSIS — L97519 Non-pressure chronic ulcer of other part of right foot with unspecified severity: Secondary | ICD-10-CM

## 2021-07-22 NOTE — Progress Notes (Signed)
Subjective: 66 year old male presents the office today for follow evaluation of wound on his right big toe.  States he has been doing well is not any signs of infection.  Denies any drainage or pus.  No increase in swelling or redness.  No fever or chills.    Recently just had a cardiac stent placed.  Objective: AAO x3, NAD DP/PT pulses palpable bilaterally, CRT less than 3 seconds Chronic edema present bilateral lower extremities. Medial aspect of the hallux is a thick hyperkeratotic lesion with central granular wound present.  After debridement of the hyperkeratotic tissue the wound itself was small measuring 0.3 x 0.2 x 0.1 cm there is no probing, undermining or tunneling.  There is no surrounding erythema, ascending cellulitis.  No fluctuation or crepitation.  There is no malodor. No pain with calf compression, swelling, warmth, erythema  Assessment: 66 year old male with ulceration right  Plan: -All treatment options discussed with the patient including all alternatives, risks, complications.  -Sharp debrided hyperkeratotic tissue today without any complications reveal a superficial granular wound present.  Sharply debrided the wound down to healthy, granular tissue remove any nonviable devitalized tissue noted to promote wound healing.  He tolerated well.  No significant blood loss.  Wound was cleansed with saline and a small amount of Silvadene was applied followed by dressing.  -Continue recommend offloading.  He had diabetic shoes but they were not fitting appropriately with the inserts.  This was evaluated today by our orthotist, Aaron Edelman. -Monitor for any clinical signs or symptoms of infection and directed to call the office immediately should any occur or go to the ER.  Return in about 3 weeks (around 08/10/2021) for ulcer.  Trula Slade DPM

## 2021-07-24 ENCOUNTER — Telehealth (HOSPITAL_COMMUNITY): Payer: Self-pay

## 2021-07-24 NOTE — Telephone Encounter (Signed)
Pt insurance is active and benefits verified through Medicare A/B. Co-pay $0.00, DED $226.00/$226.00 met, out of pocket $0.00/$0.00 met, co-insurance 20%. No pre-authorization required. Passport, 07/24/21 @ 10:08AM, BZM#[id number]   2ndary insurance is active and benefits verified through [employer]. Co-pay $0.00, DED $0.00/$0.00 met, out of pocket $0.00/$0.00 met, co-insurance 0%. No pre-authorization required.    Will contact patient to see if he is interested in the Cardiac Rehab Program. If interested, patient will need to complete follow up appt. Once completed, patient will be contacted for scheduling upon review by the RN Navigator.

## 2021-07-24 NOTE — Telephone Encounter (Signed)
Called patient to see if he is interested in the Cardiac Rehab Program. Patient expressed interest. Explained scheduling process and went over insurance, patient verbalized understanding. Will contact patient for scheduling once f/u has been completed.  °

## 2021-07-27 ENCOUNTER — Encounter: Payer: Self-pay | Admitting: Internal Medicine

## 2021-07-27 ENCOUNTER — Ambulatory Visit (INDEPENDENT_AMBULATORY_CARE_PROVIDER_SITE_OTHER): Payer: Medicare Other | Admitting: Internal Medicine

## 2021-07-27 ENCOUNTER — Ambulatory Visit: Payer: Medicare Other

## 2021-07-27 ENCOUNTER — Other Ambulatory Visit: Payer: Self-pay

## 2021-07-27 VITALS — BP 128/76 | HR 69 | Temp 98.3°F | Ht 72.0 in | Wt 306.6 lb

## 2021-07-27 DIAGNOSIS — J438 Other emphysema: Secondary | ICD-10-CM

## 2021-07-27 DIAGNOSIS — R0609 Other forms of dyspnea: Secondary | ICD-10-CM | POA: Diagnosis not present

## 2021-07-27 DIAGNOSIS — G4733 Obstructive sleep apnea (adult) (pediatric): Secondary | ICD-10-CM | POA: Diagnosis not present

## 2021-07-27 DIAGNOSIS — Z86711 Personal history of pulmonary embolism: Secondary | ICD-10-CM

## 2021-07-27 DIAGNOSIS — Z6841 Body Mass Index (BMI) 40.0 and over, adult: Secondary | ICD-10-CM

## 2021-07-27 DIAGNOSIS — I2510 Atherosclerotic heart disease of native coronary artery without angina pectoris: Secondary | ICD-10-CM | POA: Diagnosis not present

## 2021-07-27 MED ORDER — SPIRIVA RESPIMAT 2.5 MCG/ACT IN AERS: 2.0000 | INHALATION_SPRAY | Freq: Every day | RESPIRATORY_TRACT | 0 refills | Status: DC

## 2021-07-27 NOTE — Progress Notes (Signed)
Subjective:    Patient ID: [name], male    DOB: [birth year], 66 y.o.   MRN: [id number]  PCP Leandrew Koyanagi, MD > Dr [name]  HPI   IOV 01/20/2016  Chief Complaint  Patient presents with   Pulmonary Consult    Pt referred by Dr. [name] for pleuritic chest pain - left side. Pt states the pain is better when sleeping on right side. Pt denies significant cough. Pt c/o intermittent DOE.    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 He is accompanied by his wife. He has obesity and metabolic syndrome. He says that starting early June 2017 started noticing a sensation in his left lower chest left upper quadrant anterior axillary area. The pain was constant but occasionally would have spontaneous increase in intensity. The best describes this as a pain but although he says it might not be a pain but moreover sensation. It is definitely relieved when he lies down on his right side and occasionally made worse if he were to take multiple deep breaths but no other clear cut aggravating or relieving factors. Then in mid July 2017 he saw primary care physician in 01/03/2016 had Rib x-ray that was normal. Does report of atelectasis left base and therefore referred. While waiting for this appointment on 01/16/2016 he said he had several more wine drinks than usual and passed out and probably bump the left lower quadrant area of the chest against the desk but he is not sure and then this afternoon and new bruising appeared in the same area as the sensation/pain. Is no change in dyspnea or orthopnea. He does not have. His chronic sleep apnea which uses CPAP at managed by primary care physician. He is worried about pulmonary embolism because his mom died of DVT and pulmonary embolism. There is no hemoptysis  Review of chest x-ray from 2015 and prior chest films Personally visualized shows chronic elevation of the left diaphragm but he is not aware of this  problem    ICD-9-CM ICD-10-CM   1. Chest pain, pleuritic 786.52 R07.81   2. Superficial bruising of chest wall, left, initial encounter 922.1 S20.212A   3. 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 Range 01/20/2016 17:31  D-Dimer, Quant Latest Ref Range: <0.50 mcg/mL FEU 0.42     CT chest with or without contrast depending on d-dimer blood test Do sniff test next week  Followup  - depending on test results  SNIFF TEST    IMPRESSION: No paralysis of the left hemidiaphragm. Gaseous distention of the stomach which appears to be new since lap band placement.     Electronically Signed   By: Lorriane Shire M.D.   On: 01/24/2016 11:40   CT chest aug 2017  IMPRESSION: 1. No fracture is evident. There are degenerative changes throughout the thoracic spine. 2. Calcification of the coronary arteries diffusely. 3. No lung infiltrate or pleural effusion.     Electronically Signed   By: Ivar Drape M.D.   On: 01/26/2016 08:17  Nuclear medicine cardiac stress study May 2017 Study Highlights  Nuclear stress EF: 47%. The left ventricular ejection fraction is mildly decreased (45-54%). There was no ST segment deviation noted during stress. This is a low risk study. There is no evidence of ischemia or previous infarction   OV 02/02/2021  Subjective:  Patient ID: [name], male , DOB: [birth year] , age 10 y.o. , MRN: [id number] , ADDRESS: [place] [id number] PCP [name], MD Patient Care Team: [name], MD as PCP - General (Family Medicine) Werner Lean, MD as PCP - Cardiology (Cardiology) Belva Crome, MD as Consulting Physician (Cardiology) Milus Banister, MD as Attending Physician (Gastroenterology) Brand Males, MD as Consulting Physician (Pulmonary Disease) Druscilla Brownie, MD as Consulting Physician  (Dermatology) Mosetta Anis, MD as Referring Physician (Allergy) Rexene Agent, MD as Attending Physician (Nephrology) Thornell Sartorius, MD as Consulting Physician (Otolaryngology) Christain Sacramento, [place] as Referring Physician (Optometry) Ulice Brilliant, MD as Referring Physician (Internal Medicine)  This Provider for this visit: Treatment Team:  Attending Provider: Brand Males, MD    02/02/2021 -   Chief Complaint  Patient presents with   Consult    Former pt last seen 01/20/16.  Pt states that he has had problems with SOB x6 months that happens with exertion.     HPI JOHNNEY [name] 67 y.o. -personally not seen in 5 years.  Therefore there is a new consult.  He is here with his wife.  Since I last saw him he is now retired.  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  He tells me for the last 6 months or so he has had insidious onset of shortness of breath present on exertion such as walking from the car to the front desk of this office.  Relieved by rest.  Since its onset it is stable.  No associated chest pain.  There is no associated cough or wheezing.  No paroxysmal nocturnal dyspnea.  No orthopnea no weight gain.  Cardiologist has increased Lasix for pedal edema.    He had a stress test several years ago that was normal but he had low ejection fraction.  Most recently seen Dr. Ezra Sites at [facility] cardiologist who is getting an echo on him.  He also has a longstanding history of sleep apnea.  He no longer has a sleep doctor but uses auto CPAP which he thinks is mostly set at 12.  There is no oxygen use at night.  He also has a wound on the plantar aspect of his right hallux for which he sees podiatry.  He has knee issues.  He has obesity, AAA, OSA on CPAP, diabetes, hypertension.  In the midst of all this he had a routine CT scan of the chest with [facility] on December 16, 2020 and this showed a small pulmonary embolism on the right side without any RV strain.  He was  started on outpatient Xarelto by his primary care physician.  He was never hospitalized for this never on oxygen for this.  He wants to discuss this as well.  He and his wife have questions on severity of pulmonary embolism.  He wanted to know the size of it on the CT scan.  The CT scan was sent for upload to PACS system but I do not visualize the image.  He is currently on full dose Xarelto.  The only scan that I can visualize is the he 2017 CT scan that looks clear upon my personal visualization.   Simple office walk 185 feet x  3 laps goal with forehead probe 02/02/2021   O2 used ra  Number laps completed 3  Comments about pace avg  Resting Pulse Ox/HR 99% and 74/min  Final Pulse  Ox/HR 97% and 94/min  Desaturated </= 88% no  Desaturated <= 3% points no  Got Tachycardic >/= 90/min yes  Symptoms at end of test Mild dyspnea  Miscellaneous comments x     Results for LABRIAN, TORREGROSSA (MRN [id number]) as of 02/02/2021 14:37  Ref. Range 02/01/2021 15:02  Creatinine Latest Ref Range: 0.76 - 1.27 mg/dL 1.19  Results for GRADIE, OHM (MRN [id number]) as of 02/02/2021 14:37  Ref. Range 08/15/2018 10:01  Hemoglobin Latest Ref Range: 13.0 - 17.7 g/dL 16.5   CTA 12/16/20  Griffith Citron, MD - 12/16/2020  Formatting of this note might be different from the original.  INDICATION: Interstitial pulmonary disease, unspecified (#).   COMPARISON:  None.   TECHNIQUE:  Routine CT thoracic angiogram with contrast was performed using Contrast:  100 mL mL of Isovue 370.  Multiplanar 2D and angiographic 3D MIP images were constructed and reviewed. Radiation dose reduction was utilized (automated exposure control, mA or kV adjustment based on patient size, or iterative image reconstruction).   Contrast bolus quality:  satisfactory   FINDINGS:   The ascending thoracic aorta measures 3.6 x 3.5 cm in transverse by AP dimension. The descending thoracic aorta measures 2.6 x 2.6 cm in transverse by AP  dimension. There is atherosclerotic disease of the thoracic aorta and coronary arteries without evidence of focal intimal irregularity.   There is a central pulmonary arterial filling defect in the right lower lobe segmental pulmonary artery (series 4, image 43). No CT evidence of right heart strain. No pericardial effusion or bulky mediastinal adenopathy.   No focal consolidation. No pleural effusion or pneumothorax.   Gastric band is present. There is a cyst dorsally seen in the upper pole the right kidney. The visualized portions of the upper abdomen are otherwise unremarkable.    IMPRESSION:  1.  Segmental pulmonary thromboembolism in the right lower lobe. No evidence of right heart strain.        02/15/2021 Presents today for 2-week follow-up with PFTs. He reports getting winded with activity but recovered with rest. He has a rare cough. He underwent high-resolution CAT scan this month that showed no evidence of fibrotic interstitial lung disease. He had some bandlike scarring and volume loss to lung bases along with mild emphysema and coronary artery disease. Echocardiogram consistent with heart failure with preserved ejection fraction. He is on lasix 40mg  twice daily. Overnight oximetry on CPAP was completed 2 days ago and results are pending. He has been referred to hematology to discuss length of anticoagulation for hx PE. He is currently being treated for cellulitis with doxycycline. No fevers.   History of pulmonary embolus (PE) - D-dimer was normal, follow up with hematology for anticoagulation duration    OSA (obstructive sleep apnea) - Continue CPAP nightly, awaiting ONO results      03/08/2021- interim hx  Patient presents today for televisit. He has yet to try Advair because he states that he has not been that active. He still gets short winded, he attributes this to being overweight and right lower lobe pulmonary embolism which was diagnosed in July 2022. He was having  shortness of breath at the time that he believed was related to covid-19. He has an 80 pack year smoking hx. He travels a lot for work and is obese which are also contributing factors. He continues to take Xarelto. He saw hematology on September 1st, he will need to be on anticoagulation indefinitely, he will need to continue Xarelto 20mg   daily for 6 months and then will reevaluate and consider decreasing to maintenance dose. He has a rare cough with some associated wheezing.     OV 07/27/2021  Subjective:  Patient ID: [name], male , DOB: 09-14-1955 , age 59 y.o. , MRN: [id number] , ADDRESS: [place] [id number] PCP [name], MD Patient Care Team: [name], MD as PCP - General (Family Medicine) Werner Lean, MD as PCP - Cardiology (Cardiology) Belva Crome, MD as Consulting Physician (Cardiology) Milus Banister, MD as Attending Physician (Gastroenterology) Brand Males, MD as Consulting Physician (Pulmonary Disease) Druscilla Brownie, MD as Consulting Physician (Dermatology) Mosetta Anis, MD as Referring Physician (Allergy) Rexene Agent, MD as Attending Physician (Nephrology) Thornell Sartorius, MD as Consulting Physician (Otolaryngology) Christain Sacramento, [place] as Referring Physician (Optometry) Ulice Brilliant, MD as Referring Physician (Internal Medicine)  This Provider for this visit: Treatment Team:  Attending Provider: Brand Males, MD    07/27/2021 -   Chief Complaint  Patient presents with   Follow-up    Pt states he still has complaints of SOB with exertion.   Multifactorial dyspnea.  Presents for follow-up.  Presents with his wife.  HPI MARQUEZE [name] 89 y.o. -returns for follow-up.  Last seen in the summer 2022.  In between he see nurse practitioner who reviewed his results his high-resolution CT chest back then did not show any ILD.  There was some paraseptal emphysema.  She gave him Advair but  this did not help him.  He says subsequently he looked up paraseptal emphysema and then panicked thinking his life extensively was very limited.  He is continue to deal with dyspnea on exertion.  He did have an echocardiogram in the summer that did not show any RV strain.  He had diastolic dysfunction.  But on 07/18/2021 he underwent right heart catheterization to right radial approach.  He had a 90% proximal RCA stenosis with a heavily calcified lesion.  He also had a mid RCA 75% stenosis lesion.  His EF is 55 to 65%.  He is status post 2 stents.  Despite this he does not feel any better.  He continues to have shortness of breath with exertion.  He states he is compliant with his Eliquis.  He is worried that the blood clot is the reason for the shortness of breath because of all the shortness of breath started after that.  He continues to have a high BMI.  He did see oncology on 06/28/2021 and at that time expressed that he was having dyspnea 400 feet relieved by rest.  He sometimes uses a wheelchair.  He did see Dr. Lorenso Courier in January and hematology.  Indefinite anticoagulation has been recommended.  This is because of unprovoked right lower lobe segmental pulmonary embolism.  We did discuss potential Spiriva empiric for the mild paraseptal emphysema that is from his remote smoking.  He is agreeable to give this a try.  Did give him samples.  CT Chest data  No results found.    PFT  PFT Results Latest Ref Rng & Units 02/15/2021  FVC-Pre L 2.98  FVC-Predicted Pre % 60  FVC-Post L 3.33  FVC-Predicted Post % 67  Pre FEV1/FVC % % 84  Post FEV1/FCV % % 84  FEV1-Pre L 2.49  FEV1-Predicted Pre % 67  FEV1-Post L 2.80  DLCO uncorrected ml/min/mmHg 30.12  DLCO UNC% % 105  DLVA Predicted % 143  TLC L 5.84  TLC % Predicted % 78  RV % Predicted % 109       has a past medical history of Abdominal aortic aneurysm (AAA), 30-34 mm diameter (06/01/2017), Arthritis, Cervical disc disease (12/26/2011),  Coronary artery disease involving native coronary artery of native heart without angina pectoris (12/01/2016), Diabetes mellitus, Difficult intubation (04/05/2014), Dyslipidemia, Family history of anesthesia complication, Fatty liver (06/01/2017), GERD (gastroesophageal reflux disease), H/O hiatal hernia, Hearing loss-aides (03/26/2012), Hyperlipidemia, Hypertension, Morbid obesity ([place]), OSA (obstructive sleep apnea), Rotator cuff tear, Seasonal allergies, Shortness of breath, and Sleep apnea.   reports that he quit smoking about 29 years ago. His smoking use included cigarettes. He started smoking about 48 years ago. He has a 80.00 pack-year smoking history. He has never used smokeless tobacco.  Past Surgical History:  Procedure Laterality Date   CARDIAC CATHETERIZATION     approx 3 years ago    COLONOSCOPY WITH PROPOFOL N/A 03/10/2015   Procedure: COLONOSCOPY WITH PROPOFOL;  Surgeon: Milus Banister, MD;  Location: [facility];  Service: Endoscopy;  Laterality: N/A;   colonscopy      2012   CORONARY ATHERECTOMY N/A 07/18/2021   Procedure: CORONARY ATHERECTOMY;  Surgeon: Jettie Booze, MD;  Location: [facility];  Service: Cardiovascular;  Laterality: N/A;   CORONARY STENT INTERVENTION N/A 07/18/2021   Procedure: CORONARY STENT INTERVENTION;  Surgeon: Jettie Booze, MD;  Location: [facility];  Service: Cardiovascular;  Laterality: N/A;   ESOPHAGOGASTRODUODENOSCOPY (EGD) WITH PROPOFOL N/A 03/10/2015   Procedure: ESOPHAGOGASTRODUODENOSCOPY (EGD) WITH PROPOFOL;  Surgeon: Milus Banister, MD;  Location: [facility];  Service: Endoscopy;  Laterality: N/A;   HERNIA REPAIR     with gastric banding   LAPAROSCOPIC GASTRIC BANDING N/A 04/05/2014   Procedure: LAPAROSCOPIC GASTRIC BANDING;  Surgeon: Pedro Earls, MD;  Location: [facility];  Service: General;  Laterality: N/A;   MENISCUS REPAIR Left    left knee torn meniscus   RIGHT/LEFT HEART CATH AND CORONARY ANGIOGRAPHY N/A  07/18/2021   Procedure: RIGHT/LEFT HEART CATH AND CORONARY ANGIOGRAPHY;  Surgeon: Jettie Booze, MD;  Location: [facility];  Service: Cardiovascular;  Laterality: N/A;   VASECTOMY      Allergies  Allergen Reactions   Farxiga [Dapagliflozin] Diarrhea and Itching   Erythromycin Nausea Only   Penicillins Nausea Only    Upset stomach    Immunization History  Administered Date(s) Administered   Influenza, High Dose Seasonal PF 04/30/2018   Influenza,inj,Quad PF,6+ Mos 06/30/2016, 03/14/2017   Moderna Sars-Covid-2 Vaccination 10/14/2020   PFIZER(Purple Top)SARS-COV-2 Vaccination 09/10/2019, 10/05/2019   Pneumococcal Polysaccharide-23 10/09/2012   Tdap 02/09/2015    Family History  Problem Relation Age of Onset   Clotting disorder Mother        lung clot   Mesothelioma Father    Hypertension Brother    Skin cancer Brother    Colon cancer Neg Hx      Current Outpatient Medications:    acetaminophen (TYLENOL) 500 MG tablet, Take 500 mg by mouth every 6 (six) hours as needed (for pain.)., Disp: , Rfl:    aspirin 81 MG chewable tablet, Chew 1 tablet (81 mg total) by mouth daily., Disp: 30 tablet, Rfl: 0   clopidogrel (PLAVIX) 75 MG tablet, Take 1 tablet (75 mg total) by mouth daily with breakfast., Disp: 90 tablet, Rfl: 3   diltiazem (CARDIZEM CD) 300 MG 24 hr capsule, Take 300 mg by mouth in the morning., Disp: , Rfl:    diphenhydrAMINE (  BENADRYL) 25 MG tablet, Take 25 mg by mouth every 6 (six) hours as needed for itching., Disp: , Rfl:    fluticasone (FLONASE) 50 MCG/ACT nasal spray, Place 2 sprays into both nostrils daily. (Patient taking differently: Place 2 sprays into both nostrils daily as needed (congestion).), Disp: 16 g, Rfl: 2   glucose blood (TRUETEST TEST) test strip, Use as instructed, Disp: 100 each, Rfl: 2   insulin degludec (TRESIBA FLEXTOUCH) 200 UNIT/ML FlexTouch Pen, Inject 114 Units into the skin in the morning., Disp: , Rfl:    Insulin Pen Needle  (B-D UF III MINI PEN NEEDLES) 31G X 5 MM MISC, USE AS DIRECTED ONCE A DAY, Disp: 100 each, Rfl: 11   lisinopril (ZESTRIL) 40 MG tablet, Take 40 mg by mouth in the morning., Disp: , Rfl:    metoprolol tartrate (LOPRESSOR) 25 MG tablet, Take 1/2 tablet (12.5 mg total) by mouth 2 (two) times daily., Disp: 90 tablet, Rfl: 3   Multiple Vitamin (MULTIVITAMIN WITH MINERALS) TABS tablet, Take 1 tablet by mouth in the morning., Disp: , Rfl:    NEOMYCIN-POLYMYXIN-HYDROCORTISONE (CORTISPORIN) 1 % SOLN OTIC solution, Place 2 drops into the left ear every 6 (six) hours as needed (ear irritation/discomfort)., Disp: , Rfl:    Potassium Chloride ER 20 MEQ TBCR, Take 60 mEq by mouth daily. (Patient taking differently: Take 40 mEq by mouth in the morning.), Disp: 180 tablet, Rfl: 3   rosuvastatin (CRESTOR) 40 MG tablet, Take 1 tablet (40 mg total) by mouth daily., Disp: 90 tablet, Rfl: 3   silver sulfADIAZINE (SILVADENE) 1 % cream, Apply 1 application topically daily., Disp: 50 g, Rfl: 0   tamsulosin (FLOMAX) 0.4 MG CAPS capsule, Take 1 capsule (0.4 mg total) by mouth daily after supper. (Patient taking differently: Take 0.4 mg by mouth in the morning.), Disp: 90 capsule, Rfl: 0   Tiotropium Bromide Monohydrate (SPIRIVA RESPIMAT) 2.5 MCG/ACT AERS, Inhale 2 puffs into the lungs daily., Disp: 4 g, Rfl: 0   torsemide (DEMADEX) 20 MG tablet, Take 2 tablets (40 mg total) by mouth daily., Disp: 180 tablet, Rfl: 3   XARELTO 20 MG TABS tablet, Take 20 mg by mouth in the morning., Disp: , Rfl:       Objective:   Vitals:   07/27/21 1443  BP: 128/76  Pulse: 69  Temp: 98.3 F (36.8 C)  TempSrc: Oral  SpO2: 96%  Weight: (!) 306 lb 9.6 oz (139.1 kg)  Height: 6' (1.829 m)    Estimated body mass index is 41.58 kg/m as calculated from the following:   Height as of this encounter: 6' (1.829 m).   Weight as of this encounter: 306 lb 9.6 oz (139.1 kg).  @WEIGHTCHANGE @  [employer]   07/27/21 1443  Weight: (!) 306  lb 9.6 oz (139.1 kg)     Physical Exam    General: No distress. obese Neuro: Alert and Oriented x 3. GCS 15. Speech normal Psych: Pleasant Resp:  Barrel Chest - no.  Wheeze - no, Crackles - no, No overt respiratory distress CVS: Normal heart sounds. Murmurs - no Ext: Stigmata of Connective Tissue Disease - no HEENT: Normal upper airway. PEERL +. No post nasal drip        Assessment:       ICD-10-CM   1. DOE (dyspnea on exertion)  R06.09 NM Pulmonary Perfusion    DG Chest 2 View    2. History of pulmonary embolus (PE)  Z86.711 NM Pulmonary Perfusion  DG Chest 2 View    3. OSA (obstructive sleep apnea)  G47.33     4. Coronary artery disease involving native coronary artery of native heart without angina pectoris  I25.10     5. BMI 40.0-44.9, adult ([place])  Z68.41     6. Paraseptal emphysema ([facility])  J43.8          Plan:     Patient Instructions     ICD-10-CM   1. DOE (dyspnea on exertion)  R06.09     2. History of pulmonary embolus (PE)  Z86.711     3. OSA (obstructive sleep apnea)  G47.33     4. Coronary artery disease involving native coronary artery of native heart without angina pectoris  I25.10     5. BMI 40.0-44.9, adult ([facility])  Z68.41     6. Paraseptal emphysema ([facility])  J43.8       Current shortness of breath is likely multifactorial Noted recent cardiac stents  Plan  - get VQ scan to determine any residual clot contributing to shortness of breath - try empiric spiriva 2 puff once daily scheduled - after cardiology clearance recommend starting cardiac rehab  [place]  - 3 months or sooner if needed   (Level 04: Estb 30-39 min   visit type: on-site physical face to visit visit spent in total care time and counseling or/and coordination of care by this undersigned MD - Dr Brand Males. This includes one or more of the following on this same day 07/27/2021: pre-charting, chart review, note writing, documentation discussion of test results,  diagnostic or treatment recommendations, prognosis, risks and benefits of management options, instructions, education, compliance or risk-factor reduction. It excludes time spent by the [place] or office staff in the care of the patient . Actual time is 30 min)    SIGNATURE    Dr. Brand Males, M.D., F.C.C.P,  Pulmonary and Critical Care Medicine Staff Physician, [place] Director - Interstitial Lung Disease  Program  [facility] [place], [place], [address]  Pager: [phone], If no answer or between  15:00h - 7:00h: call [phone] Telephone: [phone]  5:21 PM 07/27/2021

## 2021-07-27 NOTE — Patient Instructions (Addendum)
ICD-10-CM   1. DOE (dyspnea on exertion)  R06.09     2. History of pulmonary embolus (PE)  Z86.711     3. OSA (obstructive sleep apnea)  G47.33     4. Coronary artery disease involving native coronary artery of native heart without angina pectoris  I25.10     5. BMI 40.0-44.9, adult ([place])  Z68.41     6. Paraseptal emphysema ([facility])  J43.8       Current shortness of breath is likely multifactorial Noted recent cardiac stents  Plan  - get VQ scan to determine any residual clot contributing to shortness of breath - try empiric spiriva 2 puff once daily scheduled - after cardiology clearance recommend starting cardiac rehab  [place]  - 3 months or sooner if needed

## 2021-07-28 ENCOUNTER — Telehealth (HOSPITAL_COMMUNITY): Payer: Self-pay

## 2021-07-28 ENCOUNTER — Encounter: Payer: Self-pay | Admitting: Internal Medicine

## 2021-07-28 NOTE — Telephone Encounter (Signed)
Mychart message sent by pt: [name] [facility] (supporting Brand Males, MD) 3 hours ago (10:04 AM)   HI, My wife and I are trying to plan a trip [place] shortly after my VQ scan.  Can I get this test scheduled as soon as possible so that we can leave and check results while on the road? How can I help? Thanks, [name]     Routing to [facility] for help with this.

## 2021-07-28 NOTE — Telephone Encounter (Signed)
Date of discharge: 07/19/2021 Diagnosis: CAD in native artery    Spoke with patient, new medications added: Aspirin 81 mg daily, Plavix 75 mg daily , Xarelto 20 mg daily  Medications modified: Rosuvastatin 40 mg daily   No need to transfer medications, no side effects reported. Pt overall doing well.   Home Pharmacy: [facility] on [address]

## 2021-07-30 ENCOUNTER — Encounter: Payer: Self-pay | Admitting: Hematology and Oncology

## 2021-07-30 ENCOUNTER — Encounter: Payer: Self-pay | Admitting: Internal Medicine

## 2021-07-31 ENCOUNTER — Encounter: Payer: Self-pay | Admitting: Internal Medicine

## 2021-07-31 NOTE — Progress Notes (Signed)
Cardiology Office Note    Date:  08/08/2021   ID:  [name] 09/12/1955, MRN [id number]   PCP:  [name], MD   [place]  Cardiologist:  Werner Lean, MD   Advanced Practice Provider:  No care team member to display Electrophysiologist:  None   [phone]   Chief Complaint  Patient presents with   Hospitalization Follow-up    History of Present Illness:  [name] is a 66 y.o. 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 NST 11/08/2015 and was a low risk study with overall normal blood flow. Most recent echocardiogram was 02/03/2021 and showed LVEF 60-65%, moderate LVH, grade I diastolic dysfunction.   Patient admitted with dyspnea 06/2021 improved with diuresis. R/L heart cath 07/18/2021 DES to the proximal to mid RCA after orbital atherectomy.  DES to the mid RCA. Normal Right heart pressures.  Plan for aspirin for 1 month clopidogrel for 6 months but possibly 12 months if the patient does not have any bleeding with Xarelto as well.  Metoprolol added.  If bradycardia occurs stop diltiazem Crestor increased.  Patient comes in with his wife. Doesn't notice any improvement in dyspnea since stents places. BP running 500 systolic at home. Not exercising because of a toe wound. Wanting to know if he can start cardiac rehab.Bleeds easily if he has a cut.  Past Medical History:  Diagnosis Date   Abdominal aortic aneurysm (AAA), 30-34 mm diameter 06/01/2017   Nml w/ greatest dm 3 cm US Aorta 06/2016, Abd [place] 05/01/2017 showed infrarenal dilatation at 3.4 cm - rec repeat US in 3 yrs.   Arthritis    Cervical disc disease 12/26/2011   Coronary artery disease involving native coronary artery of native heart without angina pectoris 12/01/2016   He had  coronary angiography in 2011. There was irregularity within the LAD with up to 30% narrowing. The myocardial perfusion imaging done 11/07/2015 did not demonstrate any evidence of ischemia with a low risk nuclear stress test other than EF estimated at 47%. Chest CT 01/25/2016 showed diffuse coronary artery calcifications with heavy calcifications in the LAD. Pt seen by cardiology Dr. Linard Millers    Diabetes mellitus    Difficult intubation 04/05/2014   Dyslipidemia    Family history of anesthesia complication    pt states took days for his father to awaken after mask was used    Fatty liver 06/01/2017   [place] 05/01/2017   GERD (gastroesophageal reflux disease)    has been having acid reflux since recent endoscopy    H/O hiatal hernia    repair with lap band, now has ventral hernia midline abd   Hearing loss-aides 03/26/2012   Hyperlipidemia    Hypertension    Morbid obesity ([place])    OSA (obstructive sleep apnea)    Rotator cuff tear    Seasonal allergies    Shortness of breath    pt states related to high BP meds with extended walking or climbling stairs   Sleep apnea     Past Surgical History:  Procedure Laterality Date   CARDIAC CATHETERIZATION     approx 3 years ago    COLONOSCOPY WITH PROPOFOL N/A 03/10/2015   Procedure: COLONOSCOPY WITH PROPOFOL;  Surgeon: Milus Banister, MD;  Location: [facility];  Service: Endoscopy;  Laterality: N/A;   colonscopy      2012   CORONARY ATHERECTOMY N/A 07/18/2021   Procedure: CORONARY ATHERECTOMY;  Surgeon: Jettie Booze, MD;  Location: [facility];  Service: Cardiovascular;  Laterality: N/A;   CORONARY STENT INTERVENTION N/A 07/18/2021   Procedure: CORONARY STENT INTERVENTION;  Surgeon: Jettie Booze, MD;  Location: [facility];  Service: Cardiovascular;  Laterality: N/A;   ESOPHAGOGASTRODUODENOSCOPY (EGD) WITH PROPOFOL N/A 03/10/2015   Procedure: ESOPHAGOGASTRODUODENOSCOPY (EGD) WITH PROPOFOL;  Surgeon: Milus Banister,  MD;  Location: [facility];  Service: Endoscopy;  Laterality: N/A;   HERNIA REPAIR     with gastric banding   LAPAROSCOPIC GASTRIC BANDING N/A 04/05/2014   Procedure: LAPAROSCOPIC GASTRIC BANDING;  Surgeon: Pedro Earls, MD;  Location: [facility];  Service: General;  Laterality: N/A;   MENISCUS REPAIR Left    left knee torn meniscus   RIGHT/LEFT HEART CATH AND CORONARY ANGIOGRAPHY N/A 07/18/2021   Procedure: RIGHT/LEFT HEART CATH AND CORONARY ANGIOGRAPHY;  Surgeon: Jettie Booze, MD;  Location: [facility];  Service: Cardiovascular;  Laterality: N/A;   VASECTOMY      Current Medications: Current Meds  Medication Sig   acetaminophen (TYLENOL) 500 MG tablet Take 500 mg by mouth every 6 (six) hours as needed (for pain.).   aspirin 81 MG chewable tablet Chew 1 tablet (81 mg total) by mouth daily.   clopidogrel (PLAVIX) 75 MG tablet Take 1 tablet (75 mg total) by mouth daily with breakfast.   diltiazem (CARDIZEM CD) 300 MG 24 hr capsule Take 300 mg by mouth in the morning.   diphenhydrAMINE (BENADRYL) 25 MG tablet Take 25 mg by mouth every 6 (six) hours as needed for itching.   fluticasone (FLONASE) 50 MCG/ACT nasal spray Place 2 sprays into both nostrils daily. (Patient taking differently: Place 2 sprays into both nostrils daily as needed (congestion).)   glucose blood (TRUETEST TEST) test strip Use as instructed   insulin degludec (TRESIBA FLEXTOUCH) 200 UNIT/ML FlexTouch Pen Inject 114 Units into the skin in the morning.   Insulin Pen Needle (B-D UF III MINI PEN NEEDLES) 31G X 5 MM MISC USE AS DIRECTED ONCE A DAY   lisinopril (ZESTRIL) 40 MG tablet Take 40 mg by mouth in the morning.   metoprolol tartrate (LOPRESSOR) 25 MG tablet Take 1/2 tablet (12.5 mg total) by mouth 2 (two) times daily. (Patient taking differently: Take 25 mg by mouth daily.)   Multiple Vitamin (MULTIVITAMIN WITH MINERALS) TABS tablet Take 1 tablet by mouth in the morning.    NEOMYCIN-POLYMYXIN-HYDROCORTISONE (CORTISPORIN) 1 % SOLN OTIC solution Place 2 drops into the left ear every 6 (six) hours as needed (ear irritation/discomfort).   Potassium Chloride ER 20 MEQ TBCR Take 60 mEq by mouth daily. (Patient taking differently: Take 40 mEq by mouth in the morning.)   rosuvastatin (CRESTOR) 40 MG tablet Take 1 tablet (40 mg total) by mouth daily.   silver sulfADIAZINE (SILVADENE) 1 % cream Apply 1 application topically daily.   tamsulosin (FLOMAX) 0.4 MG CAPS capsule Take 1 capsule (0.4 mg total) by mouth daily after supper. (Patient taking differently: Take 0.4 mg by mouth in the morning.)   torsemide (DEMADEX) 20 MG tablet Take 2 tablets (40 mg total) by mouth daily.   XARELTO 20 MG TABS tablet Take 20 mg by mouth in the morning.     Allergies:   Farxiga [dapagliflozin], Erythromycin, and Penicillins  Social History   Socioeconomic History   Marital status: Married    Spouse name: Not on file   Number of children: 2   Years of education: Not on file   Highest education level: Not on file  Occupational History   Occupation: owner    Comment: [employer]  Tobacco Use   Smoking status: Former    Packs/day: 4.00    Years: 20.00    Pack years: 80.00    Types: Cigarettes    Start date: 07/19/1973    Quit date: 12/13/1991    Years since quitting: 29.6   Smokeless tobacco: Never  Vaping Use   Vaping Use: Never used  Substance and Sexual Activity   Alcohol use: Yes    Alcohol/week: 3.0 - 5.0 standard drinks    Types: 3 - 5 Glasses of wine per week    Comment: takes 3 to 5 glasses of wine nightly    Drug use: No   Sexual activity: Not on file  Other Topics Concern   Not on file  Social History Narrative   Not on file   Social Determinants of Health   Financial Resource Strain: Not on file  Food Insecurity: Not on file  Transportation Needs: Not on file  Physical Activity: Not on file  Stress: Not on file  Social Connections: Not on file      Family History:  The patient's  family history includes Clotting disorder in his mother; Hypertension in his brother; Mesothelioma in his father; Skin cancer in his brother.   ROS:   Please see the history of present illness.    ROS All other systems reviewed and are negative.   PHYSICAL EXAM:   VS:  BP 110/64    Pulse 69    Ht 6' (1.829 m)    Wt (!) 303 lb 3.2 oz (137.5 kg)    SpO2 95%    BMI 41.12 kg/m   Physical Exam  GEN: Obese, in no acute distress  Neck: no JVD, carotid bruits, or masses Cardiac:RRR; no murmurs, rubs, or gallops  Respiratory:  clear to auscultation bilaterally, normal work of breathing GI: soft, nontender, nondistended, + BS Ext: right arm at cath site without hematoma or hemorrhage, lower ext without cyanosis, clubbing, or edema, Good distal pulses bilaterally Neuro:  Alert and Oriented x 3 Psych: euthymic mood, full affect  Wt Readings from Last 3 Encounters:  08/08/21 (!) 303 lb 3.2 oz (137.5 kg)  07/27/21 (!) 306 lb 9.6 oz (139.1 kg)  07/18/21 295 lb (133.8 kg)      Studies/Labs Reviewed:   EKG:  EKG is not ordered today.     Recent Labs: 10/21/2020: NT-Pro BNP 18 02/01/2021: Magnesium 2.0 06/28/2021: ALT 31 07/19/2021: BUN 8; Creatinine, Ser 1.08; Hemoglobin 13.6; Platelets 164; Potassium 3.5; Sodium 137   Lipid Panel    Component Value Date/Time   CHOL 147 08/15/2018 1001   TRIG 194 (H) 08/15/2018 1001   HDL 54 08/15/2018 1001   CHOLHDL 2.7 08/15/2018 1001   CHOLHDL 5.2 (H) 09/07/2015 1802   VLDL NOT CALC 09/07/2015 1802   LDLCALC 54 08/15/2018 1001    Additional studies/ records that were reviewed today include:  Right/Left Heart Cath 07/18/2021    Prox RCA to Mid RCA lesion is 90% stenosed.  Heavily calcified lesion.   After orbital atherectomy, A drug-eluting stent was successfully placed using a STENT ONYX FRONTIER 4.0X34 and postdilated to 4.5 mm, optimized with intravascular ultrasound.   Post  intervention, there is a 0% residual  stenosis.   Mid RCA lesion is 75% stenosed.   A drug-eluting stent was successfully placed using a STENT ONYX FRONTIER 4.0X15, postdilated to 4.5 mm and optimized with intravascular ultrasound.   Post intervention, there is a 0% residual stenosis.   The left ventricular systolic function is normal.   LV end diastolic pressure is normal.   The left ventricular ejection fraction is 55-65% by visual estimate.   There is no aortic valve stenosis.   Ao sat 94%, PA sat 74%, PA pressure 25 of 10, mean PA pressure 14 mmHg, mean pulmonary capillary wedge pressure 9 mmHg, cardiac output 8.6 L/min, cardiac index 3.4.   Continue aggressive secondary prevention.  Normal right heart pressures.  I suspect his shortness of breath is likely multifactorial from coronary ischemia as well as obesity and deconditioning.  Weight loss will be beneficial.  Plan for aspirin therapy for 1 month.  Minimum clopidogrel for 6 months.  Okay to restart Xarelto tomorrow.  Would consider treating with clopidogrel for 12 months if the he has no bleeding problems with the combination of Xarelto.   Diagnostic Dominance: Right Intervention   _____________     Risk Assessment/Calculations:         ASSESSMENT:    1. Coronary artery disease involving native coronary artery of native heart without angina pectoris   2. Heart failure with preserved ejection fraction, unspecified HF chronicity ([place])   3. Hyperlipidemia, unspecified hyperlipidemia type   4. BMI 40.0-44.9, adult ([place])   5. Essential hypertension   6. OSA (obstructive sleep apnea)      PLAN:  In order of problems listed above:  CAD status post orbital atherectomy and DES to the proximal to mid RCA and DES to the mid RCA 07/18/2021-no chest pain. No improvement in dyspnea since stents. Sees pulm tomorrow. Can stop ASA March 4. Continue Plavix and Xarelto. Can start cardiac rehab but limited with toe wound that hasn't healed.   Heart failure preserved EF  LVEF 60 to 65% with grade 1 DD on echo 02/03/2021 normal right heart pressures on cath-mild edema.  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  Concerns regarding medicines are outlined above.  Medication changes, Labs and Tests ordered today are listed in the Patient Instructions below. Patient Instructions  Medication Instructions:  Your physician recommends that you continue on your current medications as directed. Please refer to the Current Medication list given to you today. *If you need a refill on your cardiac medications before your next appointment, please call your pharmacy*   Lab Work: None Ordered   Testing/Procedures: None Ordered   Follow-Up: At [facility], you and your health needs are our priority.  As part of our continuing mission to provide you with exceptional heart care, we have created designated Provider Care Teams.  These Care Teams include your primary Cardiologist (physician) and Advanced Practice Providers (APPs -  Physician Assistants and Nurse Practitioners) who all work together to provide you with the care you need, when you need it.  We recommend signing up for the patient portal called "MyChart".  Sign up information is provided on this After Visit Summary.  MyChart is used to connect with patients for Virtual Visits (Telemedicine).  Patients are able to view lab/test results, encounter notes, upcoming appointments, etc.  Non-urgent messages can be sent to your provider as well.   To learn more about what you can do with MyChart, go to [email or web address].    Your next appointment:   2 month(s)  The  format for your next appointment:   In Person  Provider:   Werner Lean, MD    Other Instructions OK TO START CARDIAC REHAB     Signed, Ermalinda Barrios, PA-C  08/08/2021 1:37 PM    [facility] [address], [place], [place]  [address] Phone: [phone]; Fax: [phone]

## 2021-07-31 NOTE — Telephone Encounter (Signed)
PCC's is the patient able to call to get a sooner appointment?

## 2021-07-31 NOTE — Telephone Encounter (Signed)
Wylene Simmer the first thing [facility] had was 2/22 if it needs to be sooner we may need to put it in as urgent

## 2021-08-01 NOTE — Telephone Encounter (Signed)
I mean he can call and try to get sooner that's why I sent a message back to see if they need to put it in as urgent the phone number there is [phone]

## 2021-08-01 NOTE — Telephone Encounter (Signed)
Ok let us stop spiriva. I do not have any other ideas for his dyspnea becuas ei s multifactorial. REst per plan from recent OV

## 2021-08-01 NOTE — Telephone Encounter (Signed)
Dr. Chase Caller, please see pt's email regarding the sample of Spiriva he received at his last OV on 07/27/21. Thanks!

## 2021-08-02 ENCOUNTER — Encounter: Payer: Self-pay | Admitting: Physical Medicine and Rehabilitation

## 2021-08-03 NOTE — Telephone Encounter (Signed)
Already spoken with patient.

## 2021-08-08 ENCOUNTER — Ambulatory Visit (INDEPENDENT_AMBULATORY_CARE_PROVIDER_SITE_OTHER): Payer: Medicare Other | Admitting: Physician Assistant

## 2021-08-08 ENCOUNTER — Encounter: Payer: Self-pay | Admitting: Physician Assistant

## 2021-08-08 ENCOUNTER — Other Ambulatory Visit: Payer: Self-pay

## 2021-08-08 VITALS — BP 110/64 | HR 69 | Ht 72.0 in | Wt 303.2 lb

## 2021-08-08 DIAGNOSIS — E785 Hyperlipidemia, unspecified: Secondary | ICD-10-CM

## 2021-08-08 DIAGNOSIS — Z6841 Body Mass Index (BMI) 40.0 and over, adult: Secondary | ICD-10-CM

## 2021-08-08 DIAGNOSIS — I10 Essential (primary) hypertension: Secondary | ICD-10-CM

## 2021-08-08 DIAGNOSIS — I2510 Atherosclerotic heart disease of native coronary artery without angina pectoris: Secondary | ICD-10-CM | POA: Diagnosis not present

## 2021-08-08 DIAGNOSIS — I5030 Unspecified diastolic (congestive) heart failure: Secondary | ICD-10-CM | POA: Diagnosis not present

## 2021-08-08 DIAGNOSIS — G4733 Obstructive sleep apnea (adult) (pediatric): Secondary | ICD-10-CM

## 2021-08-08 NOTE — Patient Instructions (Signed)
Medication Instructions:  Your physician recommends that you continue on your current medications as directed. Please refer to the Current Medication list given to you today. *If you need a refill on your cardiac medications before your next appointment, please call your pharmacy*   Lab Work: None Ordered   Testing/Procedures: None Ordered   Follow-Up: At [facility], you and your health needs are our priority.  As part of our continuing mission to provide you with exceptional heart care, we have created designated Provider Care Teams.  These Care Teams include your primary Cardiologist (physician) and Advanced Practice Providers (APPs -  Physician Assistants and Nurse Practitioners) who all work together to provide you with the care you need, when you need it.  We recommend signing up for the patient portal called "MyChart".  Sign up information is provided on this After Visit Summary.  MyChart is used to connect with patients for Virtual Visits (Telemedicine).  Patients are able to view lab/test results, encounter notes, upcoming appointments, etc.  Non-urgent messages can be sent to your provider as well.   To learn more about what you can do with MyChart, go to [email or web address].    Your next appointment:   2 month(s)  The format for your next appointment:   In Person  Provider:   Werner Lean, MD    Other Instructions OK TO [place]

## 2021-08-09 ENCOUNTER — Encounter (HOSPITAL_COMMUNITY)
Admission: RE | Admit: 2021-08-09 | Discharge: 2021-08-09 | Disposition: A | Discharge status: Home / Self Care | Payer: Medicare Other | Source: Ambulatory Visit | Attending: Internal Medicine | Admitting: Internal Medicine

## 2021-08-09 ENCOUNTER — Ambulatory Visit (HOSPITAL_COMMUNITY)
Admission: RE | Admit: 2021-08-09 | Discharge: 2021-08-09 | Disposition: A | Discharge status: Home / Self Care | Payer: Medicare Other | Source: Ambulatory Visit | Attending: Internal Medicine | Admitting: Internal Medicine

## 2021-08-09 DIAGNOSIS — Z86711 Personal history of pulmonary embolism: Secondary | ICD-10-CM | POA: Insufficient documentation

## 2021-08-09 DIAGNOSIS — R0609 Other forms of dyspnea: Secondary | ICD-10-CM

## 2021-08-09 MED ORDER — TECHNETIUM TO 99M ALBUMIN AGGREGATED
4.2700 | Freq: Once | INTRAVENOUS | Status: AC | PRN
  Administered 2021-08-09: 4.27 via INTRAVENOUS

## 2021-08-10 ENCOUNTER — Encounter: Payer: Self-pay | Admitting: Hematology and Oncology

## 2021-08-10 ENCOUNTER — Encounter: Payer: Self-pay | Admitting: Internal Medicine

## 2021-08-10 NOTE — Telephone Encounter (Signed)
°  Results indicate that there is no evidence of chronic blood clot.  Previous blood clot is fully cleared up.  This means residual blood clot is not contributing to shortness of breath which is his main complaint.  At the last visit he was worried that blood clot was a contributing reason for shortness of breath.  This would suggest that it is not.  Regarding whether he should still be on Eliquis -He had a blood clot in July 2022 [routine CT scan at [facility].  He had followed with Dr. Lorenso Courier in January 2023 and hematology.  Questions about anticoagulation duration should be directed to him   DG Chest 2 View  Result Date: 08/10/2021 CLINICAL DATA:  Shortness of breath. EXAM: CHEST - 2 VIEW COMPARISON:  June 30, 2016 FINDINGS: The heart size and mediastinal contours are within normal limits. A trace amount of atelectasis is seen within the left lung base. Both lungs are otherwise clear. Degenerative changes are seen within the mid to lower thoracic spine. IMPRESSION: No active cardiopulmonary disease. Electronically Signed   By: Virgina Norfolk M.D.   On: 08/10/2021 00:43   NM Pulmonary Perfusion  Result Date: 08/10/2021 CLINICAL DATA:  Dyspnea on exertion. EXAM: NUCLEAR MEDICINE PERFUSION LUNG SCAN TECHNIQUE: Perfusion images were obtained in multiple projections after intravenous injection of radiopharmaceutical. Ventilation scans intentionally deferred if perfusion scan and chest x-ray adequate for interpretation during COVID 19 epidemic. RADIOPHARMACEUTICALS:  4.27 mCi Tc-19m MAA IV COMPARISON:  No prior nuclear perfusion study. Images are correlated with PA and lateral chest earlier today FINDINGS: No lobar, segmental or subsegmental perfusion defect is visible bilaterally. IMPRESSION: No perfusion defect is seen in either lung. Electronically Signed   By: Telford Nab M.D.   On: 08/10/2021 02:08

## 2021-08-11 MED ORDER — METOPROLOL SUCCINATE ER 25 MG PO TB24: 25.0000 mg | ORAL_TABLET | Freq: Every day | ORAL | 3 refills | Status: DC

## 2021-08-14 ENCOUNTER — Other Ambulatory Visit: Payer: Self-pay | Admitting: *Deleted

## 2021-08-14 MED ORDER — RIVAROXABAN 10 MG PO TABS: 10.0000 mg | ORAL_TABLET | Freq: Every day | ORAL | 3 refills | Status: DC

## 2021-08-15 ENCOUNTER — Ambulatory Visit (INDEPENDENT_AMBULATORY_CARE_PROVIDER_SITE_OTHER): Payer: Medicare Other | Admitting: Podiatry

## 2021-08-15 ENCOUNTER — Other Ambulatory Visit: Payer: Self-pay

## 2021-08-15 VITALS — Temp 97.9°F

## 2021-08-15 DIAGNOSIS — E1149 Type 2 diabetes mellitus with other diabetic neurological complication: Secondary | ICD-10-CM

## 2021-08-15 DIAGNOSIS — E11621 Type 2 diabetes mellitus with foot ulcer: Secondary | ICD-10-CM | POA: Diagnosis not present

## 2021-08-15 DIAGNOSIS — L97519 Non-pressure chronic ulcer of other part of right foot with unspecified severity: Secondary | ICD-10-CM

## 2021-08-16 NOTE — Progress Notes (Signed)
Subjective: 66 year old male presents the office today for follow evaluation of wound on his right big toe.  States he may clean it with alcohol and applying Silvadene cream.  He has not seen any drainage or pus.  He has been wearing his regular shoe as he thinks this is taking more pressure off of the toe than his other insert.  No increase in swelling or redness.  Denies any fevers or chills.   Objective: AAO x3, NAD DP/PT pulses palpable bilaterally, CRT less than 3 seconds Chronic edema present bilateral lower extremities. Medial aspect of the hallux is a thick hyperkeratotic lesion with central granular wound present.  After debridement of the thick hyperkeratotic tissue the wound measures 0.5 x 0.4 x 0.1 cm.  Unable to measure the wound prior to debridement as it was covered completely with callus. No pain with calf compression, swelling, warmth, erythema  Assessment: 66 year old male with ulceration right  Plan: -All treatment options discussed with the patient including all alternatives, risks, complications.  -Sharp debrided hyperkeratotic tissue today without any complications reveal a superficial granular wound present.  Sharply debrided the wound down to healthy, granular tissue remove any nonviable devitalized tissue noted to promote wound healing.  He tolerated well.  No significant blood loss.  Wound was cleansed with saline and a small amount of Medihoney was applied followed by dressing.  -Continue recommend offloading.   -He is not interested in returning to the wound care center. -Monitor for any clinical signs or symptoms of infection and directed to call the office immediately should any occur or go to the ER.  Trula Slade DPM

## 2021-08-17 ENCOUNTER — Ambulatory Visit: Payer: Medicare Other | Admitting: Podiatry

## 2021-08-21 ENCOUNTER — Encounter: Payer: Self-pay | Admitting: Podiatry

## 2021-08-21 NOTE — Telephone Encounter (Signed)
Please schedule, thanks.

## 2021-08-21 NOTE — Telephone Encounter (Signed)
Patient has appt for 09/11/21

## 2021-09-04 ENCOUNTER — Ambulatory Visit: Payer: Medicare Other

## 2021-09-04 ENCOUNTER — Ambulatory Visit (INDEPENDENT_AMBULATORY_CARE_PROVIDER_SITE_OTHER): Payer: Medicare Other | Admitting: Podiatry

## 2021-09-04 ENCOUNTER — Other Ambulatory Visit: Payer: Self-pay

## 2021-09-04 DIAGNOSIS — I2510 Atherosclerotic heart disease of native coronary artery without angina pectoris: Secondary | ICD-10-CM | POA: Diagnosis not present

## 2021-09-04 DIAGNOSIS — L97519 Non-pressure chronic ulcer of other part of right foot with unspecified severity: Secondary | ICD-10-CM

## 2021-09-04 DIAGNOSIS — L03031 Cellulitis of right toe: Secondary | ICD-10-CM | POA: Diagnosis not present

## 2021-09-04 DIAGNOSIS — E11621 Type 2 diabetes mellitus with foot ulcer: Secondary | ICD-10-CM | POA: Diagnosis not present

## 2021-09-04 MED ORDER — AMOXICILLIN-POT CLAVULANATE 875-125 MG PO TABS: 1.0000 | ORAL_TABLET | Freq: Two times a day (BID) | ORAL | 0 refills | Status: DC

## 2021-09-08 ENCOUNTER — Encounter: Payer: Self-pay | Admitting: Internal Medicine

## 2021-09-08 ENCOUNTER — Encounter: Payer: Self-pay | Admitting: Hematology and Oncology

## 2021-09-11 ENCOUNTER — Ambulatory Visit: Payer: Medicare Other | Admitting: Podiatry

## 2021-09-11 NOTE — Progress Notes (Signed)
Subjective: ?66 year old male presents the office today for follow evaluation of wound on his right big toe.  States he has noticed some redness around the area but no purulence or red streaks.  He has been applying Silvadene to the wound.  Denies any fevers or chills.  No nausea or vomiting.  No other concerns. ? ?Objective: ?AAO x3, NAD ?DP/PT pulses palpable bilaterally, CRT less than 3 seconds ?Chronic edema present bilateral lower extremities. ?Medial aspect of the hallux is a thick hyperkeratotic lesion and not able to see the wound prior to debridement.  There is dried blood present under the callus.  After debridement of the thick hyperkeratotic tissue the wound measures 0.4 x 0.2 x 0.1 cm.  Mild erythema.  No fluctuation or crepitation but there is no malodor. ?No pain with calf compression, swelling, warmth, erythema ? ? ? ? ?Assessment: ?66 year old male with ulceration right ? ?Plan: ?-All treatment options discussed with the patient including all alternatives, risks, complications.  ?-X-rays were obtained and reviewed.  No evidence of acute fracture or osteomyelitis noted today. ?-Sharp debrided hyperkeratotic tissue today without any complications reveal a superficial granular wound present.  Again sharply debrided the wound down to healthy, granular tissue remove any nonviable devitalized tissue noted to promote wound healing.  He tolerated well.  No significant blood loss.  Wound was cleansed with saline and a small amount of Silvadene was applied followed by dressing.  ?-Given localized erythema will prescribe Augmentin.  Has been on doxycycline previously for several rounds.  Most recently has been on this for other issues as well. ?-Continue offloading ?-Monitor for any clinical signs or symptoms of infection and directed to call the office immediately should any occur or go to the ER. ? ?Return in about 2 weeks (around 09/18/2021). ? ?Trula Slade DPM ?

## 2021-09-17 ENCOUNTER — Encounter: Payer: Self-pay | Admitting: Internal Medicine

## 2021-09-22 DIAGNOSIS — H60333 Swimmer's ear, bilateral: Secondary | ICD-10-CM | POA: Insufficient documentation

## 2021-09-22 DIAGNOSIS — H9113 Presbycusis, bilateral: Secondary | ICD-10-CM | POA: Insufficient documentation

## 2021-09-27 ENCOUNTER — Inpatient Hospital Stay: Payer: Medicare Other | Attending: Hematology and Oncology | Admitting: Hematology and Oncology

## 2021-09-27 ENCOUNTER — Inpatient Hospital Stay: Payer: Medicare Other

## 2021-09-27 ENCOUNTER — Other Ambulatory Visit: Payer: Self-pay

## 2021-09-27 ENCOUNTER — Other Ambulatory Visit: Payer: Self-pay | Admitting: Hematology and Oncology

## 2021-09-27 VITALS — BP 116/74 | HR 69 | Temp 97.9°F | Resp 19 | Ht 72.0 in | Wt 304.4 lb

## 2021-09-27 DIAGNOSIS — I2699 Other pulmonary embolism without acute cor pulmonale: Secondary | ICD-10-CM | POA: Insufficient documentation

## 2021-09-27 DIAGNOSIS — Z86711 Personal history of pulmonary embolism: Secondary | ICD-10-CM

## 2021-09-27 DIAGNOSIS — Z7901 Long term (current) use of anticoagulants: Secondary | ICD-10-CM | POA: Diagnosis not present

## 2021-09-27 DIAGNOSIS — I2782 Chronic pulmonary embolism: Secondary | ICD-10-CM

## 2021-09-27 LAB — CBC WITH DIFFERENTIAL (CANCER CENTER ONLY)
Abs Immature Granulocytes: 0.01 10*3/uL (ref 0.00–0.07)
Basophils Absolute: 0 10*3/uL (ref 0.0–0.1)
Basophils Relative: 0 %
Eosinophils Absolute: 0.1 10*3/uL (ref 0.0–0.5)
Eosinophils Relative: 2 %
HCT: 41.3 % (ref 39.0–52.0)
Hemoglobin: 14 g/dL (ref 13.0–17.0)
Immature Granulocytes: 0 %
Lymphocytes Relative: 33 %
Lymphs Abs: 1.9 10*3/uL (ref 0.7–4.0)
MCH: 31.9 pg (ref 26.0–34.0)
MCHC: 33.9 g/dL (ref 30.0–36.0)
MCV: 94.1 fL (ref 80.0–100.0)
Monocytes Absolute: 0.5 10*3/uL (ref 0.1–1.0)
Monocytes Relative: 8 %
Neutro Abs: 3.2 10*3/uL (ref 1.7–7.7)
Neutrophils Relative %: 57 %
Platelet Count: 167 10*3/uL (ref 150–400)
RBC: 4.39 MIL/uL (ref 4.22–5.81)
RDW: 12.5 % (ref 11.5–15.5)
WBC Count: 5.7 10*3/uL (ref 4.0–10.5)
nRBC: 0 % (ref 0.0–0.2)

## 2021-09-27 LAB — CMP (CANCER CENTER ONLY)
ALT: 33 U/L (ref 0–44)
AST: 34 U/L (ref 15–41)
Albumin: 4.1 g/dL (ref 3.5–5.0)
Alkaline Phosphatase: 51 U/L (ref 38–126)
Anion gap: 8 (ref 5–15)
BUN: 13 mg/dL (ref 8–23)
CO2: 31 mmol/L (ref 22–32)
Calcium: 8.8 mg/dL — ABNORMAL LOW (ref 8.9–10.3)
Chloride: 99 mmol/L (ref 98–111)
Creatinine: 1.31 mg/dL — ABNORMAL HIGH (ref 0.61–1.24)
GFR, Estimated: >60 mL/min (ref >60)
Glucose, Bld: 212 mg/dL — ABNORMAL HIGH (ref 70–99)
Potassium: 3.7 mmol/L (ref 3.5–5.1)
Sodium: 138 mmol/L (ref 135–145)
Total Bilirubin: 0.5 mg/dL (ref 0.3–1.2)
Total Protein: 6.9 g/dL (ref 6.5–8.1)

## 2021-09-27 NOTE — Progress Notes (Signed)
?[place] ?[phone]   [phone] ? ?PROGRESS NOTE ? ?Patient Care Team: ?[name], MD as PCP - General (Family Medicine) ?Werner Lean, MD as PCP - Cardiology (Cardiology) ?Belva Crome, MD as Consulting Physician (Cardiology) ?Milus Banister, MD as Attending Physician (Gastroenterology) ?Brand Males, MD as Consulting Physician (Pulmonary Disease) ?Druscilla Brownie, MD as Consulting Physician (Dermatology) ?Mosetta Anis, MD as Referring Physician (Allergy) ?Rexene Agent, MD as Attending Physician (Nephrology) ?Thornell Sartorius, MD as Consulting Physician (Otolaryngology) ?Christain Sacramento, OD as Referring Physician (Optometry) ?Ulice Brilliant, MD as Referring Physician (Internal Medicine) ? ?Hematological/Oncological History ?# Unprovoked RLL Segmental Pulmonary Embolism ?12/16/2020: CT Angio chest showed a segmental pulmonary thromboembolism in the right lower lobe. No evidence of right heart strain. Started on Xarelto '20mg'$  PO daily.  ?02/16/2021: establish care with Dr. Lorenso Courier ?06/28/2021: re-evaluated to consider dosage change of Xarelto. Patient has persistent shortness of breath. Will continue Xaretlo '20mg'$  PO daily.   ?08/14/2021: Transition to 10 mg p.o. Xarelto daily. ? ?Interval History:  ?[name] 66 y.o. male with medical history significant for unprovoked pulmonary embolism who presents for a follow up visit. The patient's last visit was on 06/28/2021. In the interim since the last visit he has continued on Xarelto therapy without difficulty.   ? ?On exam today [name] reports he has been well overall interim since her last visit.  He notes he would like to stop the Xarelto maintenance therapy if he could.  He notes that the clot is gone and he notes that the medication can cause up to $500 per month.  He has been having issues with easy bruising and occasional nosebleeds.  He continues to drink 4 glasses of wine per night  and is he does have occasional bouts of loose stools in the morning.  He still has issues with being short winded when walking, including today when he was walking from the parking lot to the clinic. He denies any chest pain.  He reports no fevers, chills, sweats, nausea, vomiting or diarrhea.  Full 10 point ROS is listed below. ? ? ?MEDICAL HISTORY:  ?Past Medical History:  ?Diagnosis Date  ? Abdominal aortic aneurysm (AAA), 30-34 mm diameter 06/01/2017  ? Nml w/ greatest dm 3 cm US Aorta 06/2016, Abd [place] 05/01/2017 showed infrarenal dilatation at 3.4 cm - rec repeat US in 3 yrs.  ? Arthritis   ? Cervical disc disease 12/26/2011  ? Coronary artery disease involving native coronary artery of native heart without angina pectoris 12/01/2016  ? He had coronary angiography in 2011. There was irregularity within the LAD with up to 30% narrowing. The myocardial perfusion imaging done 11/07/2015 did not demonstrate any evidence of ischemia with a low risk nuclear stress test other than EF estimated at 47%. Chest CT 01/25/2016 showed diffuse coronary artery calcifications with heavy calcifications in the LAD. Pt seen by cardiology Dr. Linard Millers   ? Diabetes mellitus   ? Difficult intubation 04/05/2014  ? Dyslipidemia   ? Family history of anesthesia complication   ? pt states took days for his father to awaken after mask was used   ? Fatty liver 06/01/2017  ? [place] 05/01/2017  ? GERD (gastroesophageal reflux disease)   ? has been having acid reflux since recent endoscopy   ? H/O hiatal hernia   ? repair with lap band, now has ventral hernia midline abd  ? Hearing loss-aides 03/26/2012  ? Hyperlipidemia   ?  Hypertension   ? Morbid obesity ([place])   ? OSA (obstructive sleep apnea)   ? Rotator cuff tear   ? Seasonal allergies   ? Shortness of breath   ? pt states related to high BP meds with extended walking or climbling stairs  ? Sleep apnea   ? ? ?SURGICAL HISTORY: ?Past Surgical History:  ?Procedure Laterality Date  ? CARDIAC  CATHETERIZATION    ? approx 3 years ago   ? COLONOSCOPY WITH PROPOFOL N/A 03/10/2015  ? Procedure: COLONOSCOPY WITH PROPOFOL;  Surgeon: Milus Banister, MD;  Location: [facility];  Service: Endoscopy;  Laterality: N/A;  ? colonscopy     ? 2012  ? CORONARY ATHERECTOMY N/A 07/18/2021  ? Procedure: CORONARY ATHERECTOMY;  Surgeon: Jettie Booze, MD;  Location: [facility];  Service: Cardiovascular;  Laterality: N/A;  ? CORONARY STENT INTERVENTION N/A 07/18/2021  ? Procedure: CORONARY STENT INTERVENTION;  Surgeon: Jettie Booze, MD;  Location: [facility];  Service: Cardiovascular;  Laterality: N/A;  ? ESOPHAGOGASTRODUODENOSCOPY (EGD) WITH PROPOFOL N/A 03/10/2015  ? Procedure: ESOPHAGOGASTRODUODENOSCOPY (EGD) WITH PROPOFOL;  Surgeon: Milus Banister, MD;  Location: [facility];  Service: Endoscopy;  Laterality: N/A;  ? HERNIA REPAIR    ? with gastric banding  ? LAPAROSCOPIC GASTRIC BANDING N/A 04/05/2014  ? Procedure: LAPAROSCOPIC GASTRIC BANDING;  Surgeon: Pedro Earls, MD;  Location: [facility];  Service: General;  Laterality: N/A;  ? MENISCUS REPAIR Left   ? left knee torn meniscus  ? RIGHT/LEFT HEART CATH AND CORONARY ANGIOGRAPHY N/A 07/18/2021  ? Procedure: RIGHT/LEFT HEART CATH AND CORONARY ANGIOGRAPHY;  Surgeon: Jettie Booze, MD;  Location: [facility];  Service: Cardiovascular;  Laterality: N/A;  ? VASECTOMY    ? ? ?SOCIAL HISTORY: ?Social History  ? ?Socioeconomic History  ? Marital status: Married  ?  Spouse name: Not on file  ? Number of children: 2  ? Years of education: Not on file  ? Highest education level: Not on file  ?Occupational History  ? Occupation: owner  ?  Comment: [employer]  ?Tobacco Use  ? Smoking status: Former  ?  Packs/day: 4.00  ?  Years: 20.00  ?  Pack years: 80.00  ?  Types: Cigarettes  ?  Start date: 07/19/1973  ?  Quit date: 12/13/1991  ?  Years since quitting: 29.8  ? Smokeless tobacco: Never  ?Vaping Use  ? Vaping Use: Never used   ?Substance and Sexual Activity  ? Alcohol use: Yes  ?  Alcohol/week: 3.0 - 5.0 standard drinks  ?  Types: 3 - 5 Glasses of wine per week  ?  Comment: takes 3 to 5 glasses of wine nightly   ? Drug use: No  ? Sexual activity: Not on file  ?Other Topics Concern  ? Not on file  ?Social History Narrative  ? Not on file  ? ?Social Determinants of Health  ? ?Financial Resource Strain: Not on file  ?Food Insecurity: Not on file  ?Transportation Needs: Not on file  ?Physical Activity: Not on file  ?Stress: Not on file  ?Social Connections: Not on file  ?Intimate Partner Violence: Not on file  ? ? ?FAMILY HISTORY: ?Family History  ?Problem Relation Age of Onset  ? Clotting disorder Mother   ?     lung clot  ? Mesothelioma Father   ? Hypertension Brother   ? Skin cancer Brother   ? Colon cancer Neg Hx   ? ? ?  ALLERGIES:  is allergic to farxiga [dapagliflozin], erythromycin, and penicillins. ? ?MEDICATIONS:  ?Current Outpatient Medications  ?Medication Sig Dispense Refill  ? acetaminophen (TYLENOL) 500 MG tablet Take 500 mg by mouth every 6 (six) hours as needed (for pain.).    ? amoxicillin-clavulanate (AUGMENTIN) 875-125 MG tablet Take 1 tablet by mouth 2 (two) times daily. 20 tablet 0  ? aspirin 81 MG chewable tablet Chew 1 tablet (81 mg total) by mouth daily. 30 tablet 0  ? clopidogrel (PLAVIX) 75 MG tablet Take 1 tablet (75 mg total) by mouth daily with breakfast. 90 tablet 3  ? diltiazem (CARDIZEM CD) 300 MG 24 hr capsule Take 300 mg by mouth in the morning.    ? diphenhydrAMINE (BENADRYL) 25 MG tablet Take 25 mg by mouth every 6 (six) hours as needed for itching.    ? fluticasone (FLONASE) 50 MCG/ACT nasal spray Place 2 sprays into both nostrils daily. (Patient taking differently: Place 2 sprays into both nostrils daily as needed (congestion).) 16 g 2  ? glucose blood (TRUETEST TEST) test strip Use as instructed 100 each 2  ? insulin degludec (TRESIBA FLEXTOUCH) 200 UNIT/ML FlexTouch Pen Inject 114 Units into the skin in  the morning.    ? Insulin Pen Needle (B-D UF III MINI PEN NEEDLES) 31G X 5 MM MISC USE AS DIRECTED ONCE A DAY 100 each 11  ? lisinopril (ZESTRIL) 40 MG tablet Take 40 mg by mouth in the morning.    ? metoprolol succina

## 2021-10-04 ENCOUNTER — Other Ambulatory Visit (HOSPITAL_COMMUNITY): Payer: Self-pay

## 2021-10-12 ENCOUNTER — Telehealth: Payer: Self-pay

## 2021-10-12 NOTE — Telephone Encounter (Signed)
? ?  Pre-operative Risk Assessment  ?  ?Patient Name: [name]  ?DOB: 1955-12-20 ?MRN: [id number]  ? ?  ? ?Request for Surgical Clearance   ? ?Procedure:   CATARACT EXTRACTION BY PE, IOL - RIGHT EYE ? ?Date of Surgery:  Clearance 11/01/21                              ?   ?Surgeon:  [facility] ?Surgeon's Group or Practice Name:  DR. Antionette Fairy. [place] ?Phone number:  [phone] ?Fax number:  [phone] ?  ?Type of Clearance Requested:   ?- Pharmacy:  Hold Aspirin and Rivaroxaban (Xarelto) NEEDS INSTRUCTIONS ?  ?Type of Anesthesia:   IV SEDATION  ?  ?Additional requests/questions:   ? ?Signed, ?Jacinta Shoe   ?10/12/2021, 4:50 PM  ? ?

## 2021-10-13 NOTE — Telephone Encounter (Signed)
? ?  Patient Name: YAMEN CASTROGIOVANNI  ?DOB: 1956/06/05 ?MRN: [id number] ? ?Primary Cardiologist: Werner Lean, MD ? ?Chart reviewed as part of pre-operative protocol coverage. Cataract extractions are recognized in guidelines as low risk surgeries that do not typically require specific preoperative testing or holding of blood thinner therapy. Therefore, given past medical history and time since last visit, based on ACC/AHA guidelines, JESTON JUNKINS would be at acceptable risk for the planned procedure without further cardiovascular testing.  ? ?I will route this recommendation to the requesting party via Epic fax function and remove from pre-op pool. ? ?Please call with questions. ? ?Christell Faith, PA-C ?10/13/2021, 8:08 AM ? ?

## 2021-10-20 ENCOUNTER — Encounter: Payer: Self-pay | Admitting: Internal Medicine

## 2021-10-20 ENCOUNTER — Encounter: Payer: Self-pay | Admitting: Podiatry

## 2021-10-20 NOTE — Telephone Encounter (Signed)
Spoke with the patient who states that he has had some increased shortness of breath and fatigue over the past couple of weeks. He states that he has always had a problem with the shortness of breath. He thought it would have resolved after clot in his lungs resolved and stents in his heart were placed. He states that he has continued to have shortness of breath with exertion. He states that he was traveling yesterday, flew from [place] to [place] to [facility]. He states that he was completely exhausted after travelling. He states a cardiologist in the [place] airport stopped and got him a wheelchair because he noticed how much difficulty he was having. He states that today he is feeling better but is still tired. He is not short of breath on the phone. He does have some swelling in his legs but it is no more than his baseline. It comes down with elevation. He denies any chest pain. He does report trouble with carrying luggage and causing arm soreness. He does have some back pain as well. He is currently in [place] and is not coming back to [place] until 5/15. He is coming back for his appointment that he has scheduled with Dr. Gasper Sells on 5/16. Patient is concerned and would like some answers on why he continues to be fatigued and have SOB. Advised I would send to Dr. Guy Begin for review and advisement.  ?

## 2021-10-23 ENCOUNTER — Telehealth: Payer: Self-pay | Admitting: Podiatry

## 2021-10-23 NOTE — Telephone Encounter (Signed)
Can someone please schedule him an appt?  ?

## 2021-10-23 NOTE — Telephone Encounter (Signed)
Called pt to schedule an appt offered appt for this morning 5.8.2023 but pt is in [place] until 5.15.2023 and I have pt scheduled to see Dr Jacqualyn Posey on 5.16.2023... ?

## 2021-10-25 ENCOUNTER — Other Ambulatory Visit: Payer: Self-pay | Admitting: Podiatry

## 2021-10-25 ENCOUNTER — Encounter (HOSPITAL_BASED_OUTPATIENT_CLINIC_OR_DEPARTMENT_OTHER): Payer: Medicare Other | Admitting: General Surgery

## 2021-10-25 DIAGNOSIS — E11621 Type 2 diabetes mellitus with foot ulcer: Secondary | ICD-10-CM

## 2021-10-26 ENCOUNTER — Ambulatory Visit: Payer: Medicare Other | Admitting: Internal Medicine

## 2021-10-29 NOTE — Progress Notes (Addendum)
Cardiology Office Note:    Date:  10/31/2021   ID:  [name], DOB [birth year], MRN [id number]  PCP:  [name], MD   [facility] Providers Cardiologist:  Werner Lean, MD     Referring MD: [name], MD   CC: SOB  History of Present Illness:    [name] is a 66 y.o. male with a hx of mild non obstructive CAD, AAA,Morbid Obesity, DM, and HTN; OSA on CPAP COVID-19 2021 who presents for evaluation in 5/22 2022: patient started on lasix with some improved symptoms, then needed torsemide, preserved EF  2023: CAD and had PCI, CPET suggestive of deconditioning, saw pulm, and V/Q scan.   Called with SOB.  Seen PT advice notes: he met a cardiologist at [facility] that helped him through the airport.  He notes that he has felt better since he has started on doxycylcine for his foot.  He notes that he might have a chronic infection and wonders if this would be a reason for him to feel better.  Presently does not long I/Os or fluid restriction.  He has gained weight, leg swelling is a bit worse.  He is down fro 8 glasses of wine to 4 glasses of wine.  They have have trouble getting established in [place]; they are 60/40 in [place].   He has had no chest pain.  He is unsure if he feels better after invention.  He notes that the diarrhea did not change with his starting of farixga in the past.  Is amenable to re-trial SGLT2i.    Past Medical History:  Diagnosis Date   Abdominal aortic aneurysm (AAA), 30-34 mm diameter ([place]) 06/01/2017   Nml w/ greatest dm 3 cm US Aorta 06/2016, Abd [place] 05/01/2017 showed infrarenal dilatation at 3.4 cm - rec repeat US in 3 yrs.   Arthritis    Cervical disc disease 12/26/2011   Coronary artery disease involving native coronary artery of native heart without angina pectoris 12/01/2016   He had coronary angiography in 2011. There was irregularity within the LAD with up to 30% narrowing. The myocardial perfusion imaging  done 11/07/2015 did not demonstrate any evidence of ischemia with a low risk nuclear stress test other than EF estimated at 47%. Chest CT 01/25/2016 showed diffuse coronary artery calcifications with heavy calcifications in the LAD. Pt seen by cardiology Dr. Linard Millers    Diabetes mellitus    Difficult intubation 04/05/2014   Dyslipidemia    Family history of anesthesia complication    pt states took days for his father to awaken after mask was used    Fatty liver 06/01/2017   [place] 05/01/2017   GERD (gastroesophageal reflux disease)    has been having acid reflux since recent endoscopy    H/O hiatal hernia    repair with lap band, now has ventral hernia midline abd   Hearing loss-aides 03/26/2012   Hyperlipidemia    Hypertension    Morbid obesity ([place])    OSA (obstructive sleep apnea)    Rotator cuff tear    Seasonal allergies    Shortness of breath    pt states related to high BP meds with extended walking or climbling stairs   Sleep apnea     Past Surgical History:  Procedure Laterality Date   CARDIAC CATHETERIZATION     approx 3 years ago    COLONOSCOPY WITH PROPOFOL N/A 03/10/2015   Procedure: COLONOSCOPY WITH PROPOFOL;  Surgeon:  Milus Banister, MD;  Location: [facility];  Service: Endoscopy;  Laterality: N/A;   colonscopy      2012   CORONARY ATHERECTOMY N/A 07/18/2021   Procedure: CORONARY ATHERECTOMY;  Surgeon: Jettie Booze, MD;  Location: [facility];  Service: Cardiovascular;  Laterality: N/A;   CORONARY STENT INTERVENTION N/A 07/18/2021   Procedure: CORONARY STENT INTERVENTION;  Surgeon: Jettie Booze, MD;  Location: [facility];  Service: Cardiovascular;  Laterality: N/A;   ESOPHAGOGASTRODUODENOSCOPY (EGD) WITH PROPOFOL N/A 03/10/2015   Procedure: ESOPHAGOGASTRODUODENOSCOPY (EGD) WITH PROPOFOL;  Surgeon: Milus Banister, MD;  Location: [facility];  Service: Endoscopy;  Laterality: N/A;   HERNIA REPAIR     with gastric banding   LAPAROSCOPIC  GASTRIC BANDING N/A 04/05/2014   Procedure: LAPAROSCOPIC GASTRIC BANDING;  Surgeon: Pedro Earls, MD;  Location: [facility];  Service: General;  Laterality: N/A;   MENISCUS REPAIR Left    left knee torn meniscus   RIGHT/LEFT HEART CATH AND CORONARY ANGIOGRAPHY N/A 07/18/2021   Procedure: RIGHT/LEFT HEART CATH AND CORONARY ANGIOGRAPHY;  Surgeon: Jettie Booze, MD;  Location: [facility];  Service: Cardiovascular;  Laterality: N/A;   VASECTOMY      Current Medications: Current Meds  Medication Sig   acetaminophen (TYLENOL) 500 MG tablet Take 500 mg by mouth every 6 (six) hours as needed (for pain.).   ciprofloxacin (CIPRO) 500 MG tablet Take 1 tablet (500 mg total) by mouth 2 (two) times daily.   clopidogrel (PLAVIX) 75 MG tablet Take 1 tablet (75 mg total) by mouth daily with breakfast.   diltiazem (CARDIZEM CD) 300 MG 24 hr capsule Take 300 mg by mouth in the morning.   diphenhydrAMINE (BENADRYL) 25 MG tablet Take 25 mg by mouth every 6 (six) hours as needed for itching.   doxycycline (VIBRAMYCIN) 100 MG capsule Take 100 mg by mouth 2 (two) times daily.   fluticasone (FLONASE) 50 MCG/ACT nasal spray Place into the nose as needed.   furosemide (LASIX) 20 MG tablet Take by mouth daily.   glucose blood (TRUETEST TEST) test strip Use as instructed   insulin degludec (TRESIBA FLEXTOUCH) 200 UNIT/ML FlexTouch Pen Inject 114 Units into the skin in the morning.   Insulin Pen Needle (B-D UF III MINI PEN NEEDLES) 31G X 5 MM MISC USE AS DIRECTED ONCE A DAY   lisinopril (ZESTRIL) 40 MG tablet Take 40 mg by mouth in the morning.   metoprolol succinate (TOPROL XL) 25 MG 24 hr tablet Take 1 tablet (25 mg total) by mouth daily.   Multiple Vitamin (MULTIVITAMIN WITH MINERALS) TABS tablet Take 1 tablet by mouth in the morning.   Potassium Chloride ER 20 MEQ TBCR Take 60 mEq by mouth daily. (Patient taking differently: Take 40 mEq by mouth in the morning.)   potassium chloride SA (KLOR-CON M) 20  MEQ tablet Take 2 tablets by mouth daily.   rivaroxaban (XARELTO) 10 MG TABS tablet Take 1 tablet (10 mg total) by mouth daily.   rosuvastatin (CRESTOR) 40 MG tablet Take 1 tablet (40 mg total) by mouth daily.   silver sulfADIAZINE (SILVADENE) 1 % cream Apply 1 application topically daily.   tamsulosin (FLOMAX) 0.4 MG CAPS capsule Take 1 capsule (0.4 mg total) by mouth daily after supper.   torsemide (DEMADEX) 20 MG tablet Take 2 tablets (40 mg total) by mouth daily.   [DISCONTINUED] fluticasone (FLONASE) 50 MCG/ACT nasal spray Place 2 sprays into both nostrils daily. (Patient taking  differently: Place 2 sprays into both nostrils daily as needed (congestion).)     Allergies:   Farxiga [dapagliflozin], Erythromycin, and Penicillins   Social History   Socioeconomic History   Marital status: Married    Spouse name: Not on file   Number of children: 2   Years of education: Not on file   Highest education level: Not on file  Occupational History   Occupation: owner    Comment: [employer]  Tobacco Use   Smoking status: Former    Packs/day: 4.00    Years: 20.00    Pack years: 80.00    Types: Cigarettes    Start date: 07/19/1973    Quit date: 12/13/1991    Years since quitting: 29.9   Smokeless tobacco: Never  Vaping Use   Vaping Use: Never used  Substance and Sexual Activity   Alcohol use: Yes    Alcohol/week: 3.0 - 5.0 standard drinks    Types: 3 - 5 Glasses of wine per week    Comment: takes 3 to 5 glasses of wine nightly    Drug use: No   Sexual activity: Not on file  Other Topics Concern   Not on file  Social History Narrative   Not on file   Social Determinants of Health   Financial Resource Strain: Not on file  Food Insecurity: Not on file  Transportation Needs: Not on file  Physical Activity: Not on file  Stress: Not on file  Social Connections: Not on file    Social: I took care of his wife Margarita Grizzle as well; 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 There is no history of Colon cancer. History of coronary artery disease notable for no members. History of heart failure notable for no members. History of arrhythmia notable for no members.  ROS:   Please see the history of present illness.     All other systems reviewed and are negative.  EKGs/Labs/Other Studies Reviewed:    The following studies were reviewed today:  EKG:   10/21/20: Sinus bradycardia rate 55 1st HB  NonCardiac CT: Date: 01/25/2016 Results: Aortic Atherosclerosis 3VD Calcification with LAD predominance  OSH CTPE  DATE 12/16/20 The ascending thoracic aorta measures 3.6 x 3.5 cm in transverse by AP dimension. The descending thoracic aorta measures 2.6 x 2.6 cm in transverse by AP dimension. There is atherosclerotic disease of the thoracic aorta and coronary arteries without evidence of focal intimal irregularity.   There is a central pulmonary arterial filling defect in the right lower lobe segmental pulmonary artery (series 4, image 43). No CT evidence of right heart strain. No pericardial effusion or bulky mediastinal adenopathy.   No focal consolidation. No pleural effusion or pneumothorax.   Gastric band is present. There is a cyst dorsally seen in the upper pole the right kidney. The visualized portions of the upper abdomen are otherwiseunremarkable.   Transthoracic Echocardiogram: Date: 02/03/21 Results:  1. Left ventricular ejection fraction, by estimation, is 60 to 65%. The  left ventricle has normal function. The left ventricle has no regional  wall motion abnormalities. There is moderate left ventricular hypertrophy.  Left ventricular diastolic  parameters are consistent with Grade I diastolic dysfunction (impaired   relaxation).   2. Right ventricular  systolic function is normal. The right ventricular  size is normal.   3. The mitral valve is abnormal. Trivial mitral valve regurgitation.  Moderate mitral annular calcification.   4. The aortic valve is tricuspid. Aortic valve regurgitation is not  visualized.   NM Stress Testing : Date: 11/08/2015 Results: Nuclear stress EF: 47%. The left ventricular ejection fraction is mildly decreased (45-54%). There was no ST segment deviation noted during stress. This is a low risk study. There is no evidence of ischemia or previous infarction  Abdominal Aortic Duplex Date: 04/17/2019 Abdominal Aorta: There is evidence of abnormal dilatation of the distal  Abdominal aorta. The largest aortic measurement is 2.9 cm. The largest  aortic diameter remains essentially unchanged compared to prior exam.  Previous diameter measurement was 3.0 cm  obtained on 06/2016.   Left/Right Heart Catheterizations: [id number] Results: Mild obstructive only per chart review.   Recent Labs: 02/01/2021: Magnesium 2.0 09/27/2021: ALT 33; BUN 13; Creatinine 1.31; Hemoglobin 14.0; Platelet Count 167; Potassium 3.7; Sodium 138  Recent Lipid Panel    Component Value Date/Time   CHOL 147 08/15/2018 1001   TRIG 194 (H) 08/15/2018 1001   HDL 54 08/15/2018 1001   CHOLHDL 2.7 08/15/2018 1001   CHOLHDL 5.2 (H) 09/07/2015 1802   VLDL NOT CALC 09/07/2015 1802   LDLCALC 54 08/15/2018 1001   Physical Exam:    VS:  BP (!) 96/58   Pulse 70   Ht 6' (1.829 m)   Wt (!) 306 lb (138.8 kg)   SpO2 95%   BMI 41.50 kg/m     Wt Readings from Last 3 Encounters:  10/31/21 (!) 306 lb (138.8 kg)  09/27/21 (!) 304 lb 6.4 oz (138.1 kg)  08/08/21 (!) 303 lb 3.2 oz (137.5 kg)    Gen: No distress, Morbid obese Neck: No JVD, no carotid bruit Cardiac: No Rubs or Gallops, soft systolic murmur normal rhythm, +2 radial pulses Respiratory: Clear to auscultation bilaterally, normal effort, normal   respiratory rate GI: Soft, nontender, non-distended  MS: +2 edema;  moves all extremities Integument: Skin feels warm Neuro:  At time of evaluation, alert and oriented to person/place/time/situation  Psych: Normal affect, patient feels    ASSESSMENT:    No diagnosis found.   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 Addendum: COPD on CPAP since 1990 Hypotension (relative, patients amb monitor is usually 10 mm Hg higher than ours) - had normal wedge on RHC 07/18/21, increase to torsemide 60 mg PO daily; will trial jardiace 10 mg, willget BNP BMP and MG, BMP in follow after return to [place] ([place] their house)  - unprovoked PE with no evidence of CTEPH on V/Q scan,  - CAD s/p PCI, started on Xarelto, on diltiazem 300 mg PO daily for query of microvascular disease; if no improvement we will add nitrates and PET MPI study  - Aortic athersclerosis, LDL goal < 55 - reviewed alcohol mortality curves, will decrease to 2 glasses of wine - discussed exercise given his toe - discussed 2 L fluid restriction - repeat echo in Fall at our next visit - plavix and Xarelto for 06/2021 PCI   Three months with me   Time Spent Directly with Patient:   I have spent a total of 50 minutes with the patient reviewing notes, imaging, EKGs, labs and examining the patient as well as establishing an assessment  and plan that was discussed personally with the patient.  > 50% of time was spent in direct patient care and family and reviewing imaging with patient.   Medication Adjustments/Labs and Tests Ordered: Current medicines are reviewed at length with the patient today.  Concerns regarding medicines are outlined above.  No orders of the defined types were placed in this encounter.    No orders of the defined types were placed in this encounter.    There are no Patient Instructions on  file for this visit.   Signed, Werner Lean, MD  10/31/2021 1:59 PM    [facility]

## 2021-10-31 ENCOUNTER — Ambulatory Visit (INDEPENDENT_AMBULATORY_CARE_PROVIDER_SITE_OTHER): Payer: Medicare Other

## 2021-10-31 ENCOUNTER — Encounter: Payer: Self-pay | Admitting: Internal Medicine

## 2021-10-31 ENCOUNTER — Ambulatory Visit (INDEPENDENT_AMBULATORY_CARE_PROVIDER_SITE_OTHER): Payer: Medicare Other | Admitting: Internal Medicine

## 2021-10-31 ENCOUNTER — Ambulatory Visit (INDEPENDENT_AMBULATORY_CARE_PROVIDER_SITE_OTHER): Payer: Medicare Other | Admitting: Podiatry

## 2021-10-31 ENCOUNTER — Telehealth: Payer: Self-pay | Admitting: *Deleted

## 2021-10-31 VITALS — BP 96/58 | HR 70 | Ht 72.0 in | Wt 306.0 lb

## 2021-10-31 DIAGNOSIS — L03031 Cellulitis of right toe: Secondary | ICD-10-CM | POA: Diagnosis not present

## 2021-10-31 DIAGNOSIS — E11621 Type 2 diabetes mellitus with foot ulcer: Secondary | ICD-10-CM

## 2021-10-31 DIAGNOSIS — I700 Atherosclerosis of aorta: Secondary | ICD-10-CM

## 2021-10-31 DIAGNOSIS — L97519 Non-pressure chronic ulcer of other part of right foot with unspecified severity: Secondary | ICD-10-CM

## 2021-10-31 DIAGNOSIS — R0609 Other forms of dyspnea: Secondary | ICD-10-CM

## 2021-10-31 DIAGNOSIS — I5030 Unspecified diastolic (congestive) heart failure: Secondary | ICD-10-CM | POA: Diagnosis not present

## 2021-10-31 DIAGNOSIS — I2510 Atherosclerotic heart disease of native coronary artery without angina pectoris: Secondary | ICD-10-CM | POA: Diagnosis not present

## 2021-10-31 DIAGNOSIS — E1149 Type 2 diabetes mellitus with other diabetic neurological complication: Secondary | ICD-10-CM | POA: Diagnosis not present

## 2021-10-31 MED ORDER — TORSEMIDE 20 MG PO TABS: 60.0000 mg | ORAL_TABLET | Freq: Every day | ORAL | 3 refills | Status: DC

## 2021-10-31 MED ORDER — EMPAGLIFLOZIN 10 MG PO TABS: 10.0000 mg | ORAL_TABLET | Freq: Every day | ORAL | 3 refills | Status: DC

## 2021-10-31 MED ORDER — LISINOPRIL 20 MG PO TABS: 20.0000 mg | ORAL_TABLET | Freq: Every day | ORAL | 3 refills | Status: DC

## 2021-10-31 MED ORDER — SILVER SULFADIAZINE 1 % EX CREA: 1.0000 "application " | TOPICAL_CREAM | Freq: Every day | CUTANEOUS | 0 refills | Status: DC

## 2021-10-31 MED ORDER — CIPROFLOXACIN HCL 500 MG PO TABS: 500.0000 mg | ORAL_TABLET | Freq: Two times a day (BID) | ORAL | 0 refills | Status: DC

## 2021-10-31 MED ORDER — POTASSIUM CHLORIDE CRYS ER 20 MEQ PO TBCR: 60.0000 meq | EXTENDED_RELEASE_TABLET | Freq: Every day | ORAL | 3 refills | Status: DC

## 2021-10-31 NOTE — Telephone Encounter (Signed)
Pharmacy is calling for the days supply,how long will the Silvadene 1% cream of 50 gr tube last? Please contact :[phone] ?

## 2021-10-31 NOTE — Patient Instructions (Signed)
Medication Instructions:  ?Your physician has recommended you make the following change in your medication:  ?INCREASE: torsemide to 60 mg by mouth once daily ?INCREASE: Potassium chloride to 60 mEq once daily ? ?DECREASE: lisinopril to 20 mg by mouth once daily ? ?START: Jardiance 10 mg by mouth once daily before breakfast ? ?*If you need a refill on your cardiac medications before your next appointment, please call your pharmacy* ? ? ?Lab Work: ?TODAY: BMP, BNP, Mg ?IN 1-2 WEEKS: BMP at [facility] ? ?If you have labs (blood work) drawn today and your tests are completely normal, you will receive your results only by: ?MyChart Message (if you have MyChart) OR ?A paper copy in the mail ?If you have any lab test that is abnormal or we need to change your treatment, we will call you to review the results. ? ? ?Testing/Procedures: ?IN September: Your physician has requested that you have an echocardiogram. Echocardiography is a painless test that uses sound waves to create images of your heart. It provides your doctor with information about the size and shape of your heart and how well your heart?s chambers and valves are working. This procedure takes approximately one hour. There are no restrictions for this procedure. ? ? ? ?Follow-Up: ?At [facility], you and your health needs are our priority.  As part of our continuing mission to provide you with exceptional heart care, we have created designated Provider Care Teams.  These Care Teams include your primary Cardiologist (physician) and Advanced Practice Providers (APPs -  Physician Assistants and Nurse Practitioners) who all work together to provide you with the care you need, when you need it. ? ?We recommend signing up for the patient portal called "MyChart".  Sign up information is provided on this After Visit Summary.  MyChart is used to connect with patients for Virtual Visits (Telemedicine).  Patients are able to view lab/test results, encounter  notes, upcoming appointments, etc.  Non-urgent messages can be sent to your provider as well.   ?To learn more about what you can do with MyChart, go to [email or web address].   ? ?Your next appointment:   ?4 month(s) ? ?The format for your next appointment:   ?In Person ? ?Provider:   ?Werner Lean, MD   ? ? ? ?Important Information About Sugar ? ? ? ? ?  ?

## 2021-11-01 ENCOUNTER — Encounter (HOSPITAL_BASED_OUTPATIENT_CLINIC_OR_DEPARTMENT_OTHER): Payer: Medicare Other | Attending: General Surgery | Admitting: General Surgery

## 2021-11-01 DIAGNOSIS — Z87891 Personal history of nicotine dependence: Secondary | ICD-10-CM | POA: Insufficient documentation

## 2021-11-01 DIAGNOSIS — E11621 Type 2 diabetes mellitus with foot ulcer: Secondary | ICD-10-CM | POA: Insufficient documentation

## 2021-11-01 DIAGNOSIS — I5030 Unspecified diastolic (congestive) heart failure: Secondary | ICD-10-CM | POA: Insufficient documentation

## 2021-11-01 DIAGNOSIS — I110 Hypertensive heart disease with heart failure: Secondary | ICD-10-CM | POA: Diagnosis not present

## 2021-11-01 DIAGNOSIS — Z7901 Long term (current) use of anticoagulants: Secondary | ICD-10-CM | POA: Diagnosis not present

## 2021-11-01 DIAGNOSIS — L97512 Non-pressure chronic ulcer of other part of right foot with fat layer exposed: Secondary | ICD-10-CM | POA: Diagnosis not present

## 2021-11-01 DIAGNOSIS — I2782 Chronic pulmonary embolism: Secondary | ICD-10-CM | POA: Insufficient documentation

## 2021-11-01 LAB — BASIC METABOLIC PANEL
BUN/Creatinine Ratio: 12 (ref 10–24)
BUN: 17 mg/dL (ref 8–27)
CO2: 25 mmol/L (ref 20–29)
Calcium: 9.1 mg/dL (ref 8.6–10.2)
Chloride: 98 mmol/L (ref 96–106)
Creatinine, Ser: 1.4 mg/dL — ABNORMAL HIGH (ref 0.76–1.27)
Glucose: 196 mg/dL — ABNORMAL HIGH (ref 70–99)
Potassium: 4 mmol/L (ref 3.5–5.2)
Sodium: 142 mmol/L (ref 134–144)
eGFR: 55 mL/min/{1.73_m2} — ABNORMAL LOW (ref >59)

## 2021-11-01 LAB — MAGNESIUM: Magnesium: 1.9 mg/dL (ref 1.6–2.3)

## 2021-11-01 LAB — PRO B NATRIURETIC PEPTIDE: NT-Pro BNP: <36 pg/mL (ref 0–376)

## 2021-11-01 NOTE — Progress Notes (Signed)
[name], [name] ([id number]) ?Visit Report for 11/01/2021 ?Abuse Risk Screen Details ?Patient Name: Date of Service: ?[name], [name] 11/01/2021 2:15 PM ?Medical Record Number: [id number] ?Patient Account Number: [id number] ?Date of Birth/Sex: Treating RN: ?[birth year] (66 y.o. [name]) [name] ?Primary Care [name]: [name] Other Clinician: ?Referring [name]: ?Treating [name]/Extender: [name] ?[name] ?Weeks in Treatment: 0 ?Abuse Risk Screen Items ?Answer ?ABUSE RISK SCREEN: ?Has anyone close to you tried to hurt or harm you recentlyo No ?Do you feel uncomfortable with anyone in your familyo No ?Has anyone forced you do things that you didnt want to doo No ?Electronic Signature(s) ?Signed: 11/01/2021 6:29:41 PM By: [name] Catholic RN ?Entered By: [name] on 11/01/2021 14:55:52 ?-------------------------------------------------------------------------------- ?Activities of Daily Living Details ?Patient Name: Date of Service: ?[name], [name] 11/01/2021 2:15 PM ?Medical Record Number: [id number] ?Patient Account Number: [id number] ?Date of Birth/Sex: Treating RN: ?1955-10-12 (66 y.o. [name]) [name] ?Primary Care [name]: [name] Other Clinician: ?Referring [name]: ?Treating [name]/Extender: [name] ?[name] ?Weeks in Treatment: 0 ?Activities of Daily Living Items ?Answer ?Activities of Daily Living (Please select one for each item) ?[place] ?T Medications ?ake Completely Able ?Use T elephone Completely Able ?Care for Appearance Completely Able ?Use T oilet Completely Able ?Bath / Shower Completely Able ?Dress Self Completely Able ?Feed Self Completely Able ?Walk Completely Able ?Get In / Out Bed Completely Able ?Housework Completely Able ?Prepare Meals Completely Able ?Handle Money Completely Able ?Shop for Self Completely Able ?Electronic Signature(s) ?Signed: 11/01/2021 6:29:41 PM By: [name] Catholic  RN ?Entered By: [name] on 11/01/2021 14:56:31 ?-------------------------------------------------------------------------------- ?Education Screening Details ?Patient Name: ?Date of Service: ?[name], [name] 11/01/2021 2:15 PM ?Medical Record Number: [id number] ?Patient Account Number: [id number] ?Date of Birth/Sex: ?Treating RN: ?12/01/1955 (66 y.o. [name]) [name] ?Primary Care [name]: [name] ?Other Clinician: ?Referring [name]: ?Treating [name]/Extender: [name] ?[name] ?Weeks in Treatment: 0 ?Primary Learner Assessed: Patient ?Learning Preferences/Education Level/Primary Language ?Learning Preference: Explanation, Demonstration, Printed Material ?Highest Education Level: College or Above ?Preferred Language: English ?Cognitive Barrier ?Language Barrier: No ?Translator Needed: No ?Memory Deficit: No ?Emotional Barrier: No ?Cultural/Religious Beliefs Affecting Medical Care: No ?Physical Barrier ?Impaired Vision: No ?Impaired Hearing: No ?Decreased Hand dexterity: No ?Knowledge/Comprehension ?Knowledge Level: High ?Comprehension Level: High ?Ability to understand written instructions: High ?Ability to understand verbal instructions: High ?Motivation ?Anxiety Level: Calm ?Cooperation: Cooperative ?Education Importance: Acknowledges Need ?Interest in Health Problems: Asks Questions ?Perception: Coherent ?Willingness to Engage in Self-Management High ?Activities: ?Readiness to Engage in Self-Management High ?Activities: ?Electronic Signature(s) ?Signed: 11/01/2021 6:29:41 PM By: [name] Catholic RN ?Entered By: [name] on 11/01/2021 14:57:26 ?-------------------------------------------------------------------------------- ?Fall Risk Assessment Details ?Patient Name: ?Date of Service: ?[name], [name] 11/01/2021 2:15 PM ?Medical Record Number: [id number] ?Patient Account Number: [id number] ?Date of Birth/Sex: ?Treating RN: ?[birth year] (66 y.o. [name]) [name] ?Primary Care [name]: [name] ?Other Clinician: ?Referring [name]: ?Treating [name]/Extender: [name] ?[name] ?Weeks in Treatment: 0 ?Fall Risk Assessment Items ?Have you had 2 or more falls in the last 12 monthso 0 Yes ?Have you had any fall that resulted in injury in the last 12 monthso 0 No ?FALLS RISK SCREEN ?History of falling - immediate or within 3 months 0 No ?Secondary diagnosis (Do you have 2 or more medical diagnoseso) 15 Yes ?Ambulatory aid ?None/bed rest/wheelchair/nurse 0 Yes ?Crutches/cane/walker 0 No ?Furniture 0 No ?Intravenous therapy Access/Saline/Heparin Lock 0 No ?Gait/Transferring ?  Normal/ bed rest/ wheelchair 0 Yes ?Weak (short steps with or without shuffle, stooped but able to lift head while walking, may seek 0 No ?support from furniture) ?Impaired (short steps with shuffle, may have difficulty arising from chair, head down, impaired 0 No ?balance) ?Mental Status ?Oriented to own ability 0 Yes ?Electronic Signature(s) ?Signed: 11/01/2021 6:29:41 PM By: [name] Catholic RN ?Entered By: [name] on 11/01/2021 14:58:24 ?-------------------------------------------------------------------------------- ?Foot Assessment Details ?Patient Name: ?Date of Service: ?[name], [name] 11/01/2021 2:15 PM ?Medical Record Number: [id number] ?Patient Account Number: [id number] ?Date of Birth/Sex: ?Treating RN: ?[birth year] (66 y.o. [name]) [name] ?Primary Care [name]: [name] ?Other Clinician: ?Referring [name]: ?Treating [name]/Extender: [name] ?[name] ?Weeks in Treatment: 0 ?Foot Assessment Items ?Site Locations ?+ = Sensation present, - = Sensation absent, C = Callus, U = Ulcer ?R = Redness, W = Warmth, M = Maceration, PU = Pre-ulcerative lesion ?F = Fissure, S = Swelling, D = Dryness ?Assessment ?Right: Left: ?Other Deformity: No No ?Prior Foot Ulcer: No No ?Prior Amputation: No No ?Charcot Joint: No No ?Ambulatory  Status: ?Gait: ?Electronic Signature(s) ?Signed: 11/01/2021 6:29:41 PM By: [name] Catholic RN ?Entered By: [name] on 11/01/2021 14:59:56 ?-------------------------------------------------------------------------------- ?Nutrition Risk Screening Details ?Patient Name: ?Date of Service: ?[name], [name] 11/01/2021 2:15 PM ?Medical Record Number: [id number] ?Patient Account Number: [id number] ?Date of Birth/Sex: ?Treating RN: ?13-Dec-1955 (66 y.o. [name]) [name] ?Primary Care Joandry Slagter: [name] ?Other Clinician: ?Referring Fe Okubo: ?Treating Ryleigh Esqueda/Extender: [name] ?[name] ?Weeks in Treatment: 0 ?Height (in): 72 ?Weight (lbs): 300 ?Body Mass Index (BMI): 40.7 ?Nutrition Risk Screening Items ?Score Screening ?NUTRITION RISK SCREEN: ?I have an illness or condition that made me change the kind and/or amount of food I eat 0 No ?I eat fewer than two meals per day 0 No ?I eat few fruits and vegetables, or milk products 0 No ?I have three or more drinks of beer, liquor or wine almost every day 0 No ?I have tooth or mouth problems that make it hard for me to eat 0 No ?I don't always have enough money to buy the food I need 0 No ?I eat alone most of the time 0 No ?I take three or more different prescribed or over-the-counter drugs a day 0 No ?Without wanting to, I have lost or gained 10 pounds in the last six months 0 No ?I am not always physically able to shop, cook and/or feed myself 0 No ?Nutrition Protocols ?Good Risk Protocol ?Moderate Risk Protocol ?High Risk Proctocol ?Risk Level: Good Risk ?Score: 0 ?Electronic Signature(s) ?Signed: 11/01/2021 6:29:41 PM By: [name] Catholic RN ?Entered By: [name] on 11/01/2021 14:58:32 ?

## 2021-11-02 NOTE — Progress Notes (Signed)
Subjective: 66 year old male presents the office today for follow evaluation of wound on his right big toe.States it is "doing worse".  He has noticed thick hyperkeratotic tissue present along the area.  He had some swelling and mild redness.  He is on doxycycline he states this is improved.  I did put a referral in for the wound care center for possible evaluation of hyperbaric oxygen therapy at his request and he has an appointment tomorrow for this.  Denies any fevers or chills.   As an aside he has previously had stents placed in his heart as well as blood clot removal.  He states that after that he still did not have much energy but since being on the doxycycline he feels better.   Objective: AAO x3, NAD DP/PT pulses palpable bilaterally, CRT less than 3 seconds Chronic edema present bilateral lower extremities. Prior to debridement there is thick hyperkeratotic tissue present on the right hallux with dried blood.  Upon debridement superficial wound present which is pictured below.  After debridement the wound measures 0.6 x 0.2 x 0.1 cm on the main area there is a fissure type lesion transversely across this.  There is no probing, undermining or tunneling.  There is no fluctuance or crepitation but there is no malodor.  Mild edema to the toe but he does have chronic edema to the leg as well.  No signs of abscess today. No pain with calf compression, swelling, warmth, erythema     Assessment: 66 year old male with ulceration right, cellulitis  Plan: -All treatment options discussed with the patient including all alternatives, risks, complications.  -X-rays were obtained and reviewed.  No definitive cortical destruction suggestive of osteomyelitis.  Swelling is noted. -Sharply debrided the wound without any complications and healthy, granular tissue to remove any nonviable devitalized tissue in order to promote wound healing.  Tolerated well.  No blood loss.  Pre and post wound measurements  are above.  Area was cleaned and Silvadene was applied followed by dressing. -Continue doxycycline I added Cipro. -Follow-up with the wound care center tomorrow. -Discussed further offloading.  We will order the foot defender boot for further offloading (he is a size 11.5 wide) -Monitor for any clinical signs or symptoms of infection and directed to call the office immediately should any occur or go to the ER.  Trula Slade DPM

## 2021-11-03 NOTE — Progress Notes (Signed)
[name], [name] ([id number]) Visit Report for 11/01/2021 Allergy List Details Patient Name: Date of Service: [name], [name] 11/01/2021 2:15 PM Medical Record Number: [id number] Patient Account Number: [id number] Date of Birth/Sex: Treating RN: 1955/11/18 (66 y.o. [name] Primary Care [name]: [name] Other Clinician: Referring [name]: Treating [name]/Extender: [name] in Treatment: 0 Allergies Active Allergies oak Allergy Notes Electronic Signature(s) Signed: 11/01/2021 6:29:41 PM By: [name] Catholic RN Entered By: [name] on 11/01/2021 14:36:35 -------------------------------------------------------------------------------- Arrival Information Details Patient Name: Date of Service: [name] Lefevre, MA [name] LL S. 11/01/2021 2:15 PM Medical Record Number: [id number] Patient Account Number: [id number] Date of Birth/Sex: Treating RN: 1955-08-04 (66 y.o. [name] Primary Care Kairee Isa: [name] Other Clinician: Referring Desean Heemstra: Treating [name]/Extender: [name] in Treatment: 0 Visit Information Patient Arrived: Ambulatory Arrival Time: 14:30 Accompanied By: wife Transfer Assistance: None Patient Identification Verified: Yes Secondary Verification Process Completed: Yes History Since Last Visit Added or deleted any medications: No Any new allergies or adverse reactions: No Had a fall or experienced change in activities of daily living that may affect risk of falls: No Signs or symptoms of abuse/neglect since last visito No Hospitalized since last visit: No Implantable device outside of the clinic excluding cellular tissue based products placed in the center since last visit: No Has Dressing in Place as Prescribed: Yes Electronic Signature(s) Signed: 11/01/2021 6:29:41 PM By: [name] Catholic RN Entered By: [name] on 11/01/2021  14:51:44 -------------------------------------------------------------------------------- Clinic Level of Care Assessment Details Patient Name: Date of Service: [name], [name] 11/01/2021 2:15 PM Medical Record Number: [id number] Patient Account Number: [id number] Date of Birth/Sex: Treating RN: [birth year] (66 y.o. [name] Primary Care [name]: [name] Other Clinician: Referring [name]: Treating [name]/Extender: [name] in Treatment: 0 Clinic Level of Care Assessment Items TOOL 1 Quantity Score X- 1 0 Use when EandM and Procedure is performed on INITIAL visit ASSESSMENTS - Nursing Assessment / Reassessment X- 1 20 General Physical Exam (combine w/ comprehensive assessment (listed just below) when performed on new pt. evals) X- 1 25 Comprehensive Assessment (HX, ROS, Risk Assessments, Wounds Hx, etc.) ASSESSMENTS - Wound and Skin Assessment / Reassessment '[]'$  - 0 Dermatologic / Skin Assessment (not related to wound area) ASSESSMENTS - Ostomy and/or Continence Assessment and Care '[]'$  - 0 Incontinence Assessment and Management '[]'$  - 0 Ostomy Care Assessment and Management (repouching, etc.) PROCESS - Coordination of Care X - Simple Patient / Family Education for ongoing care 1 15 '[]'$  - 0 Complex (extensive) Patient / Family Education for ongoing care X- 1 10 Staff obtains Programmer, systems, Records, T Results / Process Orders est '[]'$  - 0 Staff telephones HHA, Nursing Homes / Clarify orders / etc '[]'$  - 0 Routine Transfer to another Facility (non-emergent condition) '[]'$  - 0 Routine Hospital Admission (non-emergent condition) X- 1 15 New Admissions / Biomedical engineer / Ordering NPWT Apligraf, etc. , '[]'$  - 0 Emergency Hospital Admission (emergent condition) PROCESS - Special Needs '[]'$  - 0 Pediatric / Minor Patient Management '[]'$  - 0 Isolation Patient Management '[]'$  - 0 Hearing / Language / Visual special needs '[]'$  -  0 Assessment of Community assistance (transportation, D/C planning, etc.) '[]'$  - 0 Additional assistance / Altered mentation '[]'$  - 0 Support Surface(s) Assessment (bed, cushion, seat, etc.) INTERVENTIONS - Miscellaneous '[]'$  - 0 External ear exam '[]'$  - 0 Patient Transfer (multiple staff / Civil Service fast streamer / Similar devices) '[]'$  -  0 Simple Staple / Suture removal (25 or less) '[]'$  - 0 Complex Staple / Suture removal (26 or more) '[]'$  - 0 Hypo/Hyperglycemic Management (do not check if billed separately) '[]'$  - 0 Ankle / Brachial Index (ABI) - do not check if billed separately Has the patient been seen at the hospital within the last three years: Yes Total Score: 85 Level Of Care: New/Established - Level 3 Electronic Signature(s) Signed: 11/02/2021 12:43:38 PM By: [name] Creamer RN, BSN Signed: 11/02/2021 12:43:38 PM By: [name] Creamer RN, BSN Entered By: [name] on 11/01/2021 17:26:08 -------------------------------------------------------------------------------- Encounter Discharge Information Details Patient Name: Date of Service: [name] Lefevre, MA [name] LL S. 11/01/2021 2:15 PM Medical Record Number: [id number] Patient Account Number: [id number] Date of Birth/Sex: Treating RN: [birth year] (66 y.o. [name] Primary Care Kerrilynn Derenzo: [name] Other Clinician: Referring [name]: Treating [name]/Extender: [name] in Treatment: 0 Encounter Discharge Information Items Post Procedure Vitals Discharge Condition: Stable Temperature (F): 98.5 Ambulatory Status: Ambulatory Pulse (bpm): 73 Discharge Destination: Home Respiratory Rate (breaths/min): 18 Transportation: Private Auto Blood Pressure (mmHg): 122/71 Accompanied By: wife Schedule Follow-up Appointment: Yes Clinical Summary of Care: Patient Declined Electronic Signature(s) Signed: 11/02/2021 12:43:38 PM By: [name] Creamer RN, BSN Entered By: [name] on 11/01/2021  17:24:19 -------------------------------------------------------------------------------- Lower Extremity Assessment Details Patient Name: Date of Service: [name], [name] Burke S. 11/01/2021 2:15 PM Medical Record Number: [id number] Patient Account Number: [id number] Date of Birth/Sex: Treating RN: [birth year] (66 y.o. [name] Primary Care Correne Lalani: [name] Other Clinician: Referring Tamasha Laplante: Treating Marlie Kuennen/Extender: [name] in Treatment: 0 Edema Assessment Assessed: [name]: No] [name]: No] E[Left: dema] [Right: :] Calf Left: Right: Point of Measurement: 34 cm From Medial Instep 46 cm Ankle Left: Right: Point of Measurement: 12 cm From Medial Instep 29.2 cm Knee To Floor Left: Right: From Medial Instep 45 cm Vascular Assessment Pulses: Dorsalis Pedis Palpable: [Right:Yes] Electronic Signature(s) Signed: 11/01/2021 6:29:41 PM By: [name] Catholic RN Entered By: [name] on 11/01/2021 15:01:21 -------------------------------------------------------------------------------- Multi Wound Chart Details Patient Name: Date of Service: [name] Lefevre, MA [name] LL S. 11/01/2021 2:15 PM Medical Record Number: [id number] Patient Account Number: [id number] Date of Birth/Sex: Treating RN: 04-14-1956 (66 y.o. [name] Primary Care Fadia Marlar: [name] Other Clinician: Referring Javel Hersh: Treating Brice Kossman/Extender: [name] in Treatment: 0 Vital Signs Height(in): 72 Capillary Blood Glucose(mg/dl): 160 Weight(lbs): 300 Pulse(bpm): 67 Body Mass Index(BMI): 40.7 Blood Pressure(mmHg): 122/71 Temperature(F): 98.5 Respiratory Rate(breaths/min): 18 Photos: [N/A:N/A] Right T Great oe N/A N/A Wound Location: Gradually Appeared N/A N/A Wounding Event: Diabetic Wound/Ulcer of the Lower N/A N/A Primary Etiology: Extremity Sleep Apnea, Coronary Artery N/A N/A Comorbid History: Disease,  Hypertension, Type II Diabetes, Osteoarthritis, Neuropathy 01/31/2021 N/A N/A Date Acquired: 0 N/A N/A Weeks of Treatment: Open N/A N/A Wound Status: No N/A N/A Wound Recurrence: 0.5x1x0.4 N/A N/A Measurements L x W x D (cm) 0.393 N/A N/A A (cm) : rea 0.157 N/A N/A Volume (cm) : 0.00% N/A N/A % Reduction in A rea: 0.00% N/A N/A % Reduction in Volume: 9 Starting Position 1 (o'clock): 3 Ending Position 1 (o'clock): 0.8 Maximum Distance 1 (cm): Yes N/A N/A Undermining: Grade 2 N/A N/A Classification: Medium N/A N/A Exudate A mount: Serosanguineous N/A N/A Exudate Type: red, brown N/A N/A Exudate Color: Thickened N/A N/A Wound Margin: Small (1-33%) N/A N/A Granulation A mount: Red, Hyper-granulation N/A N/A Granulation Quality: Large (67-100%) N/A N/A Necrotic A mount: Fat Layer (  Subcutaneous Tissue): Yes N/A N/A Exposed Structures: None N/A N/A Epithelialization: Debridement - Selective/Open Wound N/A N/A Debridement: Pre-procedure Verification/Time Out 15:10 N/A N/A Taken: Other N/A N/A Pain Control: Callus, Slough N/A N/A Tissue Debrided: Non-Viable Tissue N/A N/A Level: 6 N/A N/A Debridement A (sq cm): rea Curette N/A N/A Instrument: Minimum N/A N/A Bleeding: Silver Nitrate N/A N/A Hemostasis A chieved: 0 N/A N/A Procedural Pain: 0 N/A N/A Post Procedural Pain: Procedure was tolerated well N/A N/A Debridement Treatment Response: 2x3x0.5 N/A N/A Post Debridement Measurements L x W x D (cm) 2.356 N/A N/A Post Debridement Volume: (cm) Debridement N/A N/A Procedures Performed: Treatment Notes Wound #2 (Toe Great) Wound Laterality: Right Cleanser Normal Saline Discharge Instruction: Cleanse the wound with Normal Saline prior to applying a clean dressing using gauze sponges, not tissue or cotton balls. Soap and Water Discharge Instruction: May shower and wash wound with dial antibacterial soap and water prior to dressing  change. Peri-Wound Care Topical Primary Dressing KerraCel Ag Gelling Fiber Dressing, 2x2 in (silver alginate) Discharge Instruction: Apply silver alginate to wound bed as instructed Secondary Dressing Woven Gauze Sponge, Non-Sterile 4x4 in Discharge Instruction: Apply over primary dressing as directed. Secured With Conforming Stretch Gauze Bandage, Sterile 2x75 (in/in) Discharge Instruction: Secure with stretch gauze as directed. 1M Medipore Soft Cloth Surgical T 2x10 (in/yd) ape Discharge Instruction: Secure with tape as directed. Compression Wrap Compression Stockings Add-Ons Electronic Signature(s) Signed: 11/01/2021 5:29:49 PM By: [name] MD FACS Signed: 11/02/2021 5:25:10 PM By: Levan Hurst RN, BSN Entered By: [name] on 11/01/2021 17:29:49 -------------------------------------------------------------------------------- Multi-Disciplinary Care Plan Details Patient Name: Date of Service: [name] Lefevre, MA [name] LL S. 11/01/2021 2:15 PM Medical Record Number: [id number] Patient Account Number: [id number] Date of Birth/Sex: Treating RN: [birth year] (66 y.o. [name] Primary Care Zeffie Bickert: [name] Other Clinician: Referring Chace Klippel: Treating Soraya Paquette/Extender: [name] in Treatment: 0 Active Inactive Wound/Skin Impairment Nursing Diagnoses: Impaired tissue integrity Knowledge deficit related to ulceration/compromised skin integrity Goals: Ulcer/skin breakdown will have a volume reduction of 30% by week 4 Date Initiated: 11/01/2021 Target Resolution Date: 11/29/2021 Goal Status: Active Interventions: Assess patient/caregiver ability to obtain necessary supplies Assess patient/caregiver ability to perform ulcer/skin care regimen upon admission and as needed Assess ulceration(s) every visit Notes: Electronic Signature(s) Signed: 11/01/2021 6:29:41 PM By: [name] Catholic RN Entered By: [name] on  11/01/2021 15:11:35 -------------------------------------------------------------------------------- Pain Assessment Details Patient Name: Date of Service: [name], [name] Burke S. 11/01/2021 2:15 PM Medical Record Number: [id number] Patient Account Number: [id number] Date of Birth/Sex: Treating RN: [birth year] (67 y.o. [name] Primary Care Denario Bagot: [name] Other Clinician: Referring Cheyenne Bordeaux: Treating Susy Placzek/Extender: [name] in Treatment: 0 Active Problems Location of Pain Severity and Description of Pain Patient Has Paino Yes Site Locations Rate the pain. Current Pain Level: 0 Worst Pain Level: 3 Pain Management and Medication Current Pain Management: Electronic Signature(s) Signed: 11/01/2021 6:29:41 PM By: [name] Catholic RN Entered By: [name] on 11/01/2021 15:04:49 -------------------------------------------------------------------------------- Patient/Caregiver Education Details Patient Name: Date of Service: [name], [name] 5/17/2023andnbsp2:15 PM Medical Record Number: [id number] Patient Account Number: [id number] Date of Birth/Gender: Treating RN: 1955/11/09 (66 y.o. [name] Primary Care Physician: [name] Other Clinician: Referring Physician: Treating Physician/Extender: [name] in Treatment: 0 Education Assessment Education Provided To: Patient Education Topics Provided Safety: Methods: Explain/Verbal Responses: State content correctly Wound/Skin Impairment: Methods: Explain/Verbal Responses: State content correctly Electronic Signature(s) Signed:  11/01/2021 6:29:41 PM By: [name] Catholic RN Entered By: [name] on 11/01/2021 15:16:01 -------------------------------------------------------------------------------- Wound Assessment Details Patient Name: Date of Service: [name], [name] S. 11/01/2021 2:15 PM Medical Record  Number: [id number] Patient Account Number: [id number] Date of Birth/Sex: Treating RN: 1955-09-19 (66 y.o. [name] Primary Care Janita Camberos: [name] Other Clinician: Referring Janellie Tennison: Treating Bryonna Sundby/Extender: [name] in Treatment: 0 Wound Status Wound Number: 2 Primary Diabetic Wound/Ulcer of the Lower Extremity Etiology: Wound Location: Right T Great oe Wound Open Wounding Event: Gradually Appeared Status: Date Acquired: 01/31/2021 Comorbid Sleep Apnea, Coronary Artery Disease, Hypertension, Type II Weeks Of Treatment: 0 History: Diabetes, Osteoarthritis, Neuropathy Clustered Wound: No Photos Wound Measurements Length: (cm) 0.5 Width: (cm) 1 Depth: (cm) 0.4 Area: (cm) 0.393 Volume: (cm) 0.157 Wound Description Classification: Grade 2 Wound Margin: Thickened Exudate Amount: Medium Exudate Type: Serosanguineous Exudate Color: red, brown Foul Odor After Cleansing: Slough/Fibrino % Reduction in Area: 0% % Reduction in Volume: 0% Epithelialization: None Undermining: Yes Starting Position (o'clock): 9 Ending Position (o'clock): 3 Maximum Distance: (cm) 0.8 No Yes Wound Bed Granulation Amount: Small (1-33%) Exposed Structure Granulation Quality: Red, Hyper-granulation Fat Layer (Subcutaneous Tissue) Exposed: Yes Necrotic Amount: Large (67-100%) Necrotic Quality: Adherent Slough Treatment Notes Wound #2 (Toe Great) Wound Laterality: Right Cleanser Normal Saline Discharge Instruction: Cleanse the wound with Normal Saline prior to applying a clean dressing using gauze sponges, not tissue or cotton balls. Soap and Water Discharge Instruction: May shower and wash wound with dial antibacterial soap and water prior to dressing change. Peri-Wound Care Topical Primary Dressing KerraCel Ag Gelling Fiber Dressing, 2x2 in (silver alginate) Discharge Instruction: Apply silver alginate to wound bed as instructed Secondary  Dressing Woven Gauze Sponge, Non-Sterile 4x4 in Discharge Instruction: Apply over primary dressing as directed. Secured With Conforming Stretch Gauze Bandage, Sterile 2x75 (in/in) Discharge Instruction: Secure with stretch gauze as directed. 62M Medipore Soft Cloth Surgical T 2x10 (in/yd) ape Discharge Instruction: Secure with tape as directed. Compression Wrap Compression Stockings Add-Ons Electronic Signature(s) Signed: 11/01/2021 6:29:41 PM By: [name] Catholic RN Signed: 11/02/2021 5:25:10 PM By: Levan Hurst RN, BSN Entered By: [name] on 11/01/2021 15:17:12 -------------------------------------------------------------------------------- Vitals Details Patient Name: Date of Service: [name] Lefevre, MA [name] LL S. 11/01/2021 2:15 PM Medical Record Number: [id number] Patient Account Number: [id number] Date of Birth/Sex: Treating RN: [birth year] (66 y.o. [name] Primary Care Taite Schoeppner: [name] Other Clinician: Referring Sasuke Yaffe: Treating Symantha Steeber/Extender: [name] in Treatment: 0 Vital Signs Time Taken: 14:32 Temperature (F): 98.5 Height (in): 72 Pulse (bpm): 73 Source: Stated Respiratory Rate (breaths/min): 18 Weight (lbs): 300 Blood Pressure (mmHg): 122/71 Source: Stated Capillary Blood Glucose (mg/dl): 160 Body Mass Index (BMI): 40.7 Reference Range: 80 - 120 mg / dl Electronic Signature(s) Signed: 11/01/2021 6:29:41 PM By: [name] Catholic RN Entered By: [name] on 11/01/2021 14:36:27

## 2021-11-03 NOTE — Progress Notes (Signed)
MARIN, MILLEY ([id number]) Visit Report for 11/01/2021 Chief Complaint Document Details Patient Name: Date of Service: [place], [name] 11/01/2021 2:15 PM Medical Record Number: [id number] Patient Account Number: [id number] Date of Birth/Sex: Treating RN: February 07, 1956 (66 y.o. Janyth Contes Primary Care Provider: [name] Other Clinician: Referring Provider: Treating Provider/Extender: Betsy Pries in Treatment: 0 Information Obtained from: Patient Chief Complaint Right great toe ulcer Electronic Signature(s) Signed: 11/01/2021 5:29:55 PM By: [name] MD FACS Entered By: [name] on 11/01/2021 17:29:54 -------------------------------------------------------------------------------- Debridement Details Patient Name: Date of Service: [name], [name] 11/01/2021 2:15 PM Medical Record Number: [id number] Patient Account Number: [id number] Date of Birth/Sex: Treating RN: July 19, 1955 (66 y.o. Collene Gobble Primary Care Provider: [name] Other Clinician: Referring Provider: Treating Provider/Extender: Betsy Pries in Treatment: 0 Debridement Performed for Assessment: Wound #2 Right T Great oe Performed By: Physician [name], MD Debridement Type: Debridement Severity of Tissue Pre Debridement: Fat layer exposed Level of Consciousness (Pre-procedure): Awake and Alert Pre-procedure Verification/Time Out Yes - 15:10 Taken: Start Time: 15:10 Pain Control: Other : Benzocaine 20% spray T Area Debrided (L x W): otal 2 (cm) x 3 (cm) = 6 (cm) Tissue and other material debrided: Non-Viable, Callus, Slough, Slough Level: Non-Viable Tissue Debridement Description: Selective/Open Wound Instrument: Curette Bleeding: Minimum Hemostasis Achieved: Silver Nitrate Procedural Pain: 0 Post Procedural Pain: 0 Response to Treatment: Procedure was tolerated well Level of Consciousness  (Post- Awake and Alert procedure): Post Debridement Measurements of Total Wound Length: (cm) 2 Width: (cm) 3 Depth: (cm) 0.5 Volume: (cm) 2.356 Character of Wound/Ulcer Post Debridement: Improved Severity of Tissue Post Debridement: Fat layer exposed Post Procedure Diagnosis Same as Pre-procedure Electronic Signature(s) Signed: 11/01/2021 5:45:36 PM By: [name] MD FACS Signed: 11/01/2021 6:29:41 PM By: Dellie Catholic RN Entered By: Dellie Catholic on 11/01/2021 15:18:13 -------------------------------------------------------------------------------- HPI Details Patient Name: Date of Service: [name], [name] 11/01/2021 2:15 PM Medical Record Number: [id number] Patient Account Number: [id number] Date of Birth/Sex: Treating RN: 02-19-1956 (66 y.o. Janyth Contes Primary Care Provider: [name] Other Clinician: Referring Provider: Treating Provider/Extender: Betsy Pries in Treatment: 0 History of Present Illness HPI Description: 01/04/2021 upon evaluation today patient presents for initial evaluation here in our clinic concerning issues that he has been having with a plantar toe ulceration of the right foot. He tells me this has been going on since around January 2022 when he was on a trip to [place]. Subsequently following he then went to [place] where he had that received care multiple times during the time he was in [place]. He states that [place] seem to be the best as far as that was concerned. With that being said he was initially placed on clindamycin, then doxycycline during the course of this which he feels like did not do as well, and then subsequently he has been on clindamycin which she has 4 days left of at this point. No one has cultured anything up to this point. He has been seeing podiatry and currently Dr. Earleen Newport is to he is seeing. With that being said he does have x-rays that were really not conclusive for any signs  of osteomyelitis. He does have an MRI scheduled for this coming Friday which is just 2 days away. He did have ABIs which were performed on 12/29/2020 and subsequently this did reveal that he has excellent arterial flow with his findings being  normal pretty much across the board. I am very pleased in that regard. His ABI on the right was 1.22 with a TBI of 0.93 and on the left was 1.29 with a TBI of 1.08. Waveforms were triphasic throughout. The patient tells me he is currently been cleaning this with either alcohol or peroxide and then applying Silvadene cream to this and a small dab and wrapping this. 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 Otherwise he is a type II diabetic. 01/11/2021 upon evaluation today patient appears to be doing well with regard to his wound. He is showing signs of improvement which is great news and overall very pleased with where things stand today. There does not appear to be any evidence of active infection at this time which is great news as well. In general I think that the patient is on the right track. His MRI which was actually ordered by Dr. Earleen Newport was negative for any signs of osteomyelitis that is great news. Overall I think that this is going ahead on the right direction with the appropriate care. 01/18/2021 upon evaluation today patient appears to be doing well currently in regard to his toe ulcer. There is a little bit of callus around the edges of the wound but nothing nearly as significant as what we previously noted. Fortunately there does not appear to be any signs of infection either which is also good news. I think that the patient is headed in the appropriate direction. 02/01/2021 upon evaluation today patient appears to be doing actually decently well in regard to his toe ulcer. This is actually not larger than last week  although last week he had much more callus covering over the wound that we had to clear away. Once we removed all that the wound was actually a bit bigger but nonetheless look clean and I was very happy with it. T oday unfortunately he does have some callus still building up this is evidence that he still having a lot of friction and pressure happening to the toe region. I explained to the patient that this is something that we definitely need to try to manage more effectively. Again I explained that in particular I believe that a total contact cast would be the best way to try to get this healed as quickly as possible. I have seen patients actually healed quite rapidly even in just a couple of weeks with this. Again is always difficult to say exactly how quickly someone will heal but nonetheless I believe that the cast is something that we need to have on the radar potentially even for next week. 02/08/2021 upon evaluation today patient's wound is actually showing signs of doing a little bit better compared to last week as far as callus buildup there is less callus than noted previously. With that being said I actually believe that he would benefit from application of the total contact cast has been discussed previously and I think that we should go ahead and see about applying that today. He is reluctantly to some degree but nonetheless in agreement with trying to get this healed and if it takes the cast he tells me he will do it. With all that being said I think the cast is going to be his optimal option and the gold standard for getting this healed at this point. 8/26; patient presents for his  first cast change. He reports multiple issues with the cast in the past 2 days. His main concerns are bruising to his leg and ankle. He states that the cast shifts around his foot. He also states he has had close encounters with falls in the past 2 days. Overall he feels well and denies signs of  infection. 02/05/2021 upon evaluation today patient appears to be doing poorly in regard to his wound. Unfortunately he really is not showing a lot of signs of improvement which he has not had the cast on since Friday after that first 2 days the cast change he really was not keen on putting the cast back on. Nonetheless unfortunately he had some issues with a little bit of rubbing we could definitely pad some of the areas of the right now he has an infection somewhere definitely not can be putting the cast back on. He showed me the pictures from Friday which actually did not appear to be doing too poorly at all from the standpoint of infection. I did not see anything at that time. Subsequently over the next couple of days since Friday by Sunday he had some signs of cellulitis on the distal portion of his foot interestingly this does not include the toe where the wound is. By Monday this was spreading further up the foot and he did end up going to see Dr. Amalia Hailey who was to send in doxycycline for him. I am not sure what happened but it appears Dr. Amalia Hailey did not get that prescription sent in on Monday patient contacted the office on Tuesday morning and they got him the antibiotic Tuesday morning I think Dr. Earleen Newport sent this in for him. Subsequently he took 2 doses of this yesterday and then 1 dose this morning. Nonetheless he does have some areas of streaking coming further up the foot that was not there even yesterday on the picture that he showed me. This does have me concerned for the possibility of the infection spreading. Nonetheless he has not developed any systemic symptoms such as malaise, body aches, fevers, chills, nausea, vomiting, or diarrhea. Nonetheless if any of these occur I explained that he should definitely go to the ER on the market [place] well today where the redness is and if anything goes outside of that area he should go to the ER. READMISSION 11/01/2021 The patient returns to clinic  today for continued management of the same wound for which he was seen last summer. He had been following with Dr. Earleen Newport in podiatry and actually saw him yesterday. He reports that Dr. Earleen Newport debrided the wound. The patient has been applying Silvadene after cleaning the wound with isopropyl alcohol. Apparently he has been on various courses of antibiotics since the wound first appeared about a year and a half ago. He is currently taking doxycycline and has been on this for 4 to 5 days; he is to start ciprofloxacin shortly. I do not have any culture data upon which to base this choice of antibiotics. He did have a plain film performed of his foot in Dr. Pasty Arch office yesterday. I reviewed this and do not see any signs of osteomyelitis. On his right great toe plantar surface, there is substantial buildup of callus with a slit-like opening visible. He also has denuded skin around the base of this toe where he says he pulled on a small blister which opened up the area. He is not experiencing any fevers or chills. The wound does not cause him  pain. Electronic Signature(s) Signed: 11/01/2021 5:35:32 PM By: [name] MD FACS Entered By: [name] on 11/01/2021 17:35:32 -------------------------------------------------------------------------------- Physical Exam Details Patient Name: Date of Service: [name], [name] 11/01/2021 2:15 PM Medical Record Number: [id number] Patient Account Number: [id number] Date of Birth/Sex: Treating RN: 1955-09-07 (66 y.o. Janyth Contes Primary Care Provider: [name] Other Clinician: Referring Provider: Treating Provider/Extender: Betsy Pries in Treatment: 0 Constitutional . . . . No acute distress. Respiratory Normal work of breathing on room air.. Notes 11/01/2021: On the plantar surface of his right great toe, there is substantial callus overlying a thin slit-like opening. Underneath the callus, there is  a wound that almost resembles a puncture site. There is hypertrophic granulation tissue present. He also has denuded skin around the base of his toe at the medial and dorsal aspect secondary to his removal of a blister by himself. Electronic Signature(s) Signed: 11/01/2021 5:36:53 PM By: [name] MD FACS Entered By: [name] on 11/01/2021 17:36:52 -------------------------------------------------------------------------------- Physician Orders Details Patient Name: Date of Service: [name], [name] 11/01/2021 2:15 PM Medical Record Number: [id number] Patient Account Number: [id number] Date of Birth/Sex: Treating RN: 20-Aug-1955 (65 y.o. Collene Gobble Primary Care Provider: [name] Other Clinician: Referring Provider: Treating Provider/Extender: Betsy Pries in Treatment: 0 Verbal / Phone Orders: No Diagnosis Coding ICD-10 Coding Code Description [phone] Non-pressure chronic ulcer of other part of right foot with fat layer exposed E11.621 Type 2 diabetes mellitus with foot ulcer Z68.41 Body mass index [BMI]40.0-44.9, adult I50.30 Unspecified diastolic (congestive) heart failure Follow-up Appointments ppointment in 1 week. - Dr. Celine Ahr - room 4 Return A Bathing/ Shower/ Hygiene May shower and wash wound with soap and water. - use liquid dial antibacterial soap Edema Control - Lymphedema / SCD / Other Bilateral Lower Extremities Elevate legs to the level of the heart or above for 30 minutes daily and/or when sitting, a frequency of: - 2-3 times per day Off-Loading Wound #2 Right T Great oe Open toe surgical shoe to: - off-loading shoe to right foot Wound Treatment Wound #2 - T Great oe Wound Laterality: Right Cleanser: Normal Saline Discharge Instructions: Cleanse the wound with Normal Saline prior to applying a clean dressing using gauze sponges, not tissue or cotton balls. Cleanser: Soap and Water Discharge  Instructions: May shower and wash wound with dial antibacterial soap and water prior to dressing change. Prim Dressing: KerraCel Ag Gelling Fiber Dressing, 2x2 in (silver alginate) ary Discharge Instructions: Apply silver alginate to wound bed as instructed Secondary Dressing: Woven Gauze Sponge, Non-Sterile 4x4 in Discharge Instructions: Apply over primary dressing as directed. Secured With: Child psychotherapist, Sterile 2x75 (in/in) Discharge Instructions: Secure with stretch gauze as directed. Secured With: 50M Medipore Public affairs consultant Surgical T 2x10 (in/yd) ape Discharge Instructions: Secure with tape as directed. Electronic Signature(s) Signed: 11/01/2021 5:45:36 PM By: [name] MD FACS Entered By: [name] on 11/01/2021 17:38:27 -------------------------------------------------------------------------------- Problem List Details Patient Name: Date of Service: [name], [name] 11/01/2021 2:15 PM Medical Record Number: [id number] Patient Account Number: [id number] Date of Birth/Sex: Treating RN: Jun 27, 1955 (66 y.o. Janyth Contes Primary Care Provider: [name] Other Clinician: Referring Provider: Treating Provider/Extender: Betsy Pries in Treatment: 0 Active Problems ICD-10 Encounter Code Description Active Date MDM Diagnosis [phone] Non-pressure chronic ulcer of other part of right foot with fat layer exposed 11/01/2021 No Yes E11.621 Type 2  diabetes mellitus with foot ulcer 11/01/2021 No Yes Z68.41 Body mass index [BMI]40.0-44.9, adult 11/01/2021 No Yes I50.30 Unspecified diastolic (congestive) heart failure 11/01/2021 No Yes Inactive Problems Resolved Problems Electronic Signature(s) Signed: 11/01/2021 5:29:38 PM By: [name] MD FACS Entered By: [name] on 11/01/2021 17:29:38 -------------------------------------------------------------------------------- Progress Note Details Patient Name:  Date of Service: [name], [name] 11/01/2021 2:15 PM Medical Record Number: [id number] Patient Account Number: [id number] Date of Birth/Sex: Treating RN: July 11, 1955 (66 y.o. Janyth Contes Primary Care Provider: [name] Other Clinician: Referring Provider: Treating Provider/Extender: Betsy Pries in Treatment: 0 Subjective Chief Complaint Information obtained from Patient Right great toe ulcer History of Present Illness (HPI) 01/04/2021 upon evaluation today patient presents for initial evaluation here in our clinic concerning issues that he has been having with a plantar toe ulceration of the right foot. He tells me this has been going on since around January 2022 when he was on a trip to [place]. Subsequently following he then went to [place] where he had that received care multiple times during the time he was in [place]. He states that [place] seem to be the best as far as that was concerned. With that being said he was initially placed on clindamycin, then doxycycline during the course of this which he feels like did not do as well, and then subsequently he has been on clindamycin which she has 4 days left of at this point. No one has cultured anything up to this point. He has been seeing podiatry and currently Dr. Earleen Newport is to he is seeing. With that being said he does have x-rays that were really not conclusive for any signs of osteomyelitis. He does have an MRI scheduled for this coming Friday which is just 2 days away. He did have ABIs which were performed on 12/29/2020 and subsequently this did reveal that he has excellent arterial flow with his findings being normal pretty much across the board. I am very pleased in that regard. His ABI on the right was 1.22 with a TBI of 0.93 and on the left was 1.29 with a TBI of 1.08. Waveforms were triphasic throughout. The patient tells me he is currently been cleaning this with either alcohol or peroxide  and then applying Silvadene cream to this and a small dab and wrapping this. 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 Otherwise he is a type II diabetic. 01/11/2021 upon evaluation today patient appears to be doing well with regard to his wound. He is showing signs of improvement which is great news and overall very pleased with where things stand today. There does not appear to be any evidence of active infection at this time which is great news as well. In general I think that the patient is on the right track. His MRI which was actually ordered by Dr. Earleen Newport was negative for any signs of osteomyelitis that is great news. Overall I think that this is going ahead on the right direction with the appropriate care. 01/18/2021 upon evaluation today patient appears to be doing well currently in regard to his toe ulcer. There is a little bit of callus around the edges of the wound but nothing nearly as significant as what we previously noted. Fortunately there does not appear to be any signs of infection either which is also good news. I  think that the patient is headed in the appropriate direction. 02/01/2021 upon evaluation today patient appears to be doing actually decently well in regard to his toe ulcer. This is actually not larger than last week although last week he had much more callus covering over the wound that we had to clear away. Once we removed all that the wound was actually a bit bigger but nonetheless look clean and I was very happy with it. T oday unfortunately he does have some callus still building up this is evidence that he still having a lot of friction and pressure happening to the toe region. I explained to the patient that this is something that we definitely need to try to manage more effectively. Again I explained that in particular I believe that a total  contact cast would be the best way to try to get this healed as quickly as possible. I have seen patients actually healed quite rapidly even in just a couple of weeks with this. Again is always difficult to say exactly how quickly someone will heal but nonetheless I believe that the cast is something that we need to have on the radar potentially even for next week. 02/08/2021 upon evaluation today patient's wound is actually showing signs of doing a little bit better compared to last week as far as callus buildup there is less callus than noted previously. With that being said I actually believe that he would benefit from application of the total contact cast has been discussed previously and I think that we should go ahead and see about applying that today. He is reluctantly to some degree but nonetheless in agreement with trying to get this healed and if it takes the cast he tells me he will do it. With all that being said I think the cast is going to be his optimal option and the gold standard for getting this healed at this point. 8/26; patient presents for his first cast change. He reports multiple issues with the cast in the past 2 days. His main concerns are bruising to his leg and ankle. He states that the cast shifts around his foot. He also states he has had close encounters with falls in the past 2 days. Overall he feels well and denies signs of infection. 02/05/2021 upon evaluation today patient appears to be doing poorly in regard to his wound. Unfortunately he really is not showing a lot of signs of improvement which he has not had the cast on since Friday after that first 2 days the cast change he really was not keen on putting the cast back on. Nonetheless unfortunately he had some issues with a little bit of rubbing we could definitely pad some of the areas of the right now he has an infection somewhere definitely not can be putting the cast back on. He showed me the pictures from Friday  which actually did not appear to be doing too poorly at all from the standpoint of infection. I did not see anything at that time. Subsequently over the next couple of days since Friday by Sunday he had some signs of cellulitis on the distal portion of his foot interestingly this does not include the toe where the wound is. By Monday this was spreading further up the foot and he did end up going to see Dr. Amalia Hailey who was to send in doxycycline for him. I am not sure what happened but it appears Dr. Amalia Hailey did not get that prescription sent in on  Monday patient contacted the office on Tuesday morning and they got him the antibiotic Tuesday morning I think Dr. Earleen Newport sent this in for him. Subsequently he took 2 doses of this yesterday and then 1 dose this morning. Nonetheless he does have some areas of streaking coming further up the foot that was not there even yesterday on the picture that he showed me. This does have me concerned for the possibility of the infection spreading. Nonetheless he has not developed any systemic symptoms such as malaise, body aches, fevers, chills, nausea, vomiting, or diarrhea. Nonetheless if any of these occur I explained that he should definitely go to the ER on the market [place] well today where the redness is and if anything goes outside of that area he should go to the ER. READMISSION 11/01/2021 The patient returns to clinic today for continued management of the same wound for which he was seen last summer. He had been following with Dr. Earleen Newport in podiatry and actually saw him yesterday. He reports that Dr. Earleen Newport debrided the wound. The patient has been applying Silvadene after cleaning the wound with isopropyl alcohol. Apparently he has been on various courses of antibiotics since the wound first appeared about a year and a half ago. He is currently taking doxycycline and has been on this for 4 to 5 days; he is to start ciprofloxacin shortly. I do not have any culture data  upon which to base this choice of antibiotics. He did have a plain film performed of his foot in Dr. Pasty Arch office yesterday. I reviewed this and do not see any signs of osteomyelitis. On his right great toe plantar surface, there is substantial buildup of callus with a slit-like opening visible. He also has denuded skin around the base of this toe where he says he pulled on a small blister which opened up the area. He is not experiencing any fevers or chills. The wound does not cause him pain. Patient History Information obtained from Patient. Allergies oak Family History Cancer - Father, Hypertension - Siblings, Lung Disease - Siblings, No family history of Diabetes, Heart Disease, Hereditary Spherocytosis, Kidney Disease, Seizures, Stroke, Thyroid Problems, Tuberculosis. Social History Former smoker - Quit 1993, Marital Status - Single, Alcohol Use - Daily - red wine 2-3 glasses, Drug Use - Prior History, Caffeine Use - Daily - coffee, soda. Medical History Respiratory Patient has history of Sleep Apnea Cardiovascular Patient has history of Coronary Artery Disease, Hypertension Endocrine Patient has history of Type II Diabetes Musculoskeletal Patient has history of Osteoarthritis Neurologic Patient has history of Neuropathy Medical A Surgical History Notes nd Respiratory Blood Clot Right Lung Cardiovascular AAA Review of Systems (ROS) Cardiovascular 07/18/2021 coronary stent, atherectomy, heart catheterization Objective Constitutional No acute distress. Vitals Time Taken: 2:32 PM, Height: 72 in, Source: Stated, Weight: 300 lbs, Source: Stated, BMI: 40.7, Temperature: 98.5 F, Pulse: 73 bpm, Respiratory Rate: 18 breaths/min, Blood Pressure: 122/71 mmHg, Capillary Blood Glucose: 160 mg/dl. Respiratory Normal work of breathing on room air.. General Notes: 11/01/2021: On the plantar surface of his right great toe, there is substantial callus overlying a thin slit-like opening.  Underneath the callus, there is a wound that almost resembles a puncture site. There is hypertrophic granulation tissue present. He also has denuded skin around the base of his toe at the medial and dorsal aspect secondary to his removal of a blister by himself. Integumentary (Hair, Skin) Wound #2 status is Open. Original cause of wound was Gradually Appeared. The date acquired was:  01/31/2021. The wound is located on the Right T Great. oe The wound measures 0.5cm length x 1cm width x 0.4cm depth; 0.393cm^2 area and 0.157cm^3 volume. There is Fat Layer (Subcutaneous Tissue) exposed. There is undermining starting at 9:00 and ending at 3:00 with a maximum distance of 0.8cm. There is a medium amount of serosanguineous drainage noted. The wound margin is thickened. There is small (1-33%) red, hyper - granulation within the wound bed. There is a large (67-100%) amount of necrotic tissue within the wound bed including Adherent Slough. Assessment Active Problems ICD-10 Non-pressure chronic ulcer of other part of right foot with fat layer exposed Type 2 diabetes mellitus with foot ulcer Body mass index [BMI]40.0-44.9, adult Unspecified diastolic (congestive) heart failure Procedures Wound #2 Pre-procedure diagnosis of Wound #2 is a Diabetic Wound/Ulcer of the Lower Extremity located on the Right T Great .Severity of Tissue Pre Debridement is: oe Fat layer exposed. There was a Selective/Open Wound Non-Viable Tissue Debridement with a total area of 6 sq cm performed by [name], MD. With the following instrument(s): Curette to remove Non-Viable tissue/material. Material removed includes Callus and Slough and after achieving pain control using Other (Benzocaine 20% spray). No specimens were taken. A time out was conducted at 15:10, prior to the start of the procedure. A Minimum amount of bleeding was controlled with Silver Nitrate. The procedure was tolerated well with a pain level of 0  throughout and a pain level of 0 following the procedure. Post Debridement Measurements: 2cm length x 3cm width x 0.5cm depth; 2.356cm^3 volume. Character of Wound/Ulcer Post Debridement is improved. Severity of Tissue Post Debridement is: Fat layer exposed. Post procedure Diagnosis Wound #2: Same as Pre-Procedure Plan Follow-up Appointments: Return Appointment in 1 week. - Dr. Celine Ahr - room 4 Bathing/ Shower/ Hygiene: May shower and wash wound with soap and water. - use liquid dial antibacterial soap Edema Control - Lymphedema / SCD / Other: Elevate legs to the level of the heart or above for 30 minutes daily and/or when sitting, a frequency of: - 2-3 times per day Off-Loading: Wound #2 Right T Great: oe Open toe surgical shoe to: - off-loading shoe to right foot WOUND #2: - T Great Wound Laterality: Right oe Cleanser: Normal Saline Discharge Instructions: Cleanse the wound with Normal Saline prior to applying a clean dressing using gauze sponges, not tissue or cotton balls. Cleanser: Soap and Water Discharge Instructions: May shower and wash wound with dial antibacterial soap and water prior to dressing change. Prim Dressing: KerraCel Ag Gelling Fiber Dressing, 2x2 in (silver alginate) ary Discharge Instructions: Apply silver alginate to wound bed as instructed Secondary Dressing: Woven Gauze Sponge, Non-Sterile 4x4 in Discharge Instructions: Apply over primary dressing as directed. Secured With: Child psychotherapist, Sterile 2x75 (in/in) Discharge Instructions: Secure with stretch gauze as directed. Secured With: 53M Medipore Public affairs consultant Surgical T 2x10 (in/yd) ape Discharge Instructions: Secure with tape as directed. 11/01/2021: On the plantar surface of his right great toe, there is substantial callus overlying a thin slit-like opening. Underneath the callus, there is a wound that almost resembles a puncture site. There is hypertrophic granulation tissue present. He also  has denuded skin around the base of his toe at the medial and dorsal aspect secondary to his removal of a blister by himself. Despite his report that Dr. Earleen Newport debrided the wound in clinic yesterday, it was my impression that this was insufficient and therefore I used a curette to remove all of the surrounding callus.  This exposed a larger opening in the wound that almost appears to have been secondary to a puncture. There is some hypertrophic granulation tissue in the area. We are going to use silver alginate to both the diabetic foot ulcer as well as the area of denuded skin where he removed a blister. We discussed offloading shoes and he says that he prefers to wear his own footwear. I told him we will continue to monitor the wound and see if this is really the wisest choice for him. As he is currently taking antibiotics, I am not going to take a culture. He will follow-up in 1 week. Electronic Signature(s) Signed: 11/01/2021 5:39:53 PM By: [name] MD FACS Entered By: [name] on 11/01/2021 17:39:53 -------------------------------------------------------------------------------- HxROS Details Patient Name: Date of Service: [name], [name] 11/01/2021 2:15 PM Medical Record Number: [id number] Patient Account Number: [id number] Date of Birth/Sex: Treating RN: 17-Apr-1956 (66 y.o. Janyth Contes Primary Care Provider: Other Clinician: [name] Referring Provider: Treating Provider/Extender: Betsy Pries in Treatment: 0 Information Obtained From Patient Respiratory Medical History: Positive for: Sleep Apnea Past Medical History Notes: Blood Clot Right Lung Cardiovascular Complaints and Symptoms: Review of System Notes: 07/18/2021 coronary stent, atherectomy, heart catheterization Medical History: Positive for: Coronary Artery Disease; Hypertension Past Medical History Notes: AAA Endocrine Medical History: Positive for:  Type II Diabetes Time with diabetes: 10-15 Treated with: Insulin Blood sugar tested every day: Yes Tested : every morning Musculoskeletal Medical History: Positive for: Osteoarthritis Neurologic Medical History: Positive for: Neuropathy Immunizations Pneumococcal Vaccine: Received Pneumococcal Vaccination: No Implantable Devices None Family and Social History Cancer: Yes - Father; Diabetes: No; Heart Disease: No; Hereditary Spherocytosis: No; Hypertension: Yes - Siblings; Kidney Disease: No; Lung Disease: Yes - Siblings; Seizures: No; Stroke: No; Thyroid Problems: No; Tuberculosis: No; Former smoker - Quit 1993; Marital Status - Single; Alcohol Use: Daily - red wine 2-3 glasses; Drug Use: Prior History; Caffeine Use: Daily - coffee, soda; Financial Concerns: No; Food, Clothing or Shelter Needs: No; Support System Lacking: No; Transportation Concerns: No Engineer, maintenance) Signed: 11/01/2021 5:45:36 PM By: [name] MD FACS Signed: 11/01/2021 6:29:41 PM By: Dellie Catholic RN Signed: 11/02/2021 5:25:10 PM By: Levan Hurst RN, BSN Previous Signature: 11/01/2021 2:52:08 PM Version By: [name] MD FACS Entered By: Dellie Catholic on 11/01/2021 14:55:30 -------------------------------------------------------------------------------- SuperBill Details Patient Name: Date of Service: [name], [name] 11/01/2021 Medical Record Number: [id number] Patient Account Number: [id number] Date of Birth/Sex: Treating RN: 1955-09-24 (66 y.o. Janyth Contes Primary Care Provider: [name] Other Clinician: Referring Provider: Treating Provider/Extender: Betsy Pries in Treatment: 0 Diagnosis Coding ICD-10 Codes Code Description [phone] Non-pressure chronic ulcer of other part of right foot with fat layer exposed E11.621 Type 2 diabetes mellitus with foot ulcer Z68.41 Body mass index [BMI]40.0-44.9, adult I50.30 Unspecified diastolic  (congestive) heart failure Facility Procedures CPT4 Code: [id number] Description: [phone] - DEBRIDE WOUND 1ST 20 SQ CM OR < ICD-10 Diagnosis Description E11.621 Type 2 diabetes mellitus with foot ulcer L97.512 Non-pressure chronic ulcer of other part of right foot with fat layer exposed Modifier: Quantity: 1 Physician Procedures : CPT4 Code Description Modifier [id number] - WC PHYS LEVEL 4 - EST PT ICD-10 Diagnosis Description L97.512 Non-pressure chronic ulcer of other part of right foot with fat layer exposed E11.621 Type 2 diabetes mellitus with foot ulcer I50.30  Unspecified diastolic (congestive) heart failure Z68.41 Body mass index [BMI]40.0-44.9, adult Quantity: 1 :  [id number] - WC PHYS DEBR WO ANESTH 20 SQ CM ICD-10 Diagnosis Description E11.621 Type 2 diabetes mellitus with foot ulcer L97.512 Non-pressure chronic ulcer of other part of right foot with fat layer exposed Quantity: 1 Electronic Signature(s) Signed: 11/01/2021 5:41:40 PM By: [name] MD FACS Entered By: [name] on 11/01/2021 17:41:39

## 2021-11-03 NOTE — Telephone Encounter (Signed)
Please schedule

## 2021-11-09 ENCOUNTER — Encounter (HOSPITAL_BASED_OUTPATIENT_CLINIC_OR_DEPARTMENT_OTHER): Payer: Medicare Other | Admitting: General Surgery

## 2021-11-09 DIAGNOSIS — E11621 Type 2 diabetes mellitus with foot ulcer: Secondary | ICD-10-CM | POA: Diagnosis not present

## 2021-11-09 NOTE — Progress Notes (Signed)
[name], [name] ([id number]) Visit Report for 11/09/2021 Arrival Information Details Patient Name: Date of Service: [place], [place] 11/09/2021 1:15 PM Medical Record Number: [id number] Patient Account Number: [id number] Date of Birth/Sex: Treating RN: 27-Oct-1955 (66 y.o. [name] Primary Care Sair Faulcon: [name] Other Clinician: Referring Aamira Bischoff: Treating Jaquanda Wickersham/Extender: [name] in Treatment: 1 Visit Information History Since Last Visit Added or deleted any medications: No Patient Arrived: [name] Any new allergies or adverse reactions: No Arrival Time: 13:33 Had a fall or experienced change in No Accompanied By: alone activities of daily living that may affect Transfer Assistance: None risk of falls: Patient Identification Verified: Yes Signs or symptoms of abuse/neglect since last visito No Secondary Verification Process Completed: Yes Hospitalized since last visit: No Patient Requires Transmission-Based Precautions: No Implantable device outside of the clinic excluding No Patient Has Alerts: No cellular tissue based products placed in the center since last visit: Has Dressing in Place as Prescribed: Yes Has Footwear/Offloading in Place as Prescribed: Yes Right: Wedge Shoe Pain Present Now: No Electronic Signature(s) Signed: 11/09/2021 5:38:25 PM By: Levan Hurst RN, BSN Entered By: Levan Hurst on 11/09/2021 13:33:54 -------------------------------------------------------------------------------- Encounter Discharge Information Details Patient Name: Date of Service: [name] Lefevre, [name] 11/09/2021 1:15 PM Medical Record Number: [id number] Patient Account Number: [id number] Date of Birth/Sex: Treating RN: 04/15/1956 (66 y.o. [name] Primary Care Bristol Osentoski: [name] Other Clinician: Referring Romilda Proby: Treating Osmel Dykstra/Extender: [name] in Treatment:  1 Encounter Discharge Information Items Post Procedure Vitals Discharge Condition: Stable Temperature (F): 98.5 Ambulatory Status: Cane Pulse (bpm): 74 Discharge Destination: Home Respiratory Rate (breaths/min): 18 Transportation: Private Auto Blood Pressure (mmHg): 103/67 Accompanied By: self Schedule Follow-up Appointment: Yes Clinical Summary of Care: Patient Declined Electronic Signature(s) Signed: 11/09/2021 6:06:15 PM By: Baruch Gouty RN, BSN Entered By: Baruch Gouty on 11/09/2021 14:13:41 -------------------------------------------------------------------------------- Lower Extremity Assessment Details Patient Name: Date of Service: [name], [place] S. 11/09/2021 1:15 PM Medical Record Number: [id number] Patient Account Number: [id number] Date of Birth/Sex: Treating RN: 1955/07/18 (66 y.o. [name] Primary Care Walida Cajas: [name] Other Clinician: Referring Kolette Vey: Treating Savyon Loken/Extender: [name] in Treatment: 1 Edema Assessment Assessed: Shirlyn Goltz: No] Patrice Paradise: No] E[Left: dema] [Right: :] Calf Left: Right: Point of Measurement: 34 cm From Medial Instep 46 cm Ankle Left: Right: Point of Measurement: 12 cm From Medial Instep 29 cm Vascular Assessment Pulses: Dorsalis Pedis Palpable: [Right:Yes] Electronic Signature(s) Signed: 11/09/2021 5:38:25 PM By: Levan Hurst RN, BSN Entered By: Levan Hurst on 11/09/2021 13:34:53 -------------------------------------------------------------------------------- Multi Wound Chart Details Patient Name: Date of Service: [name] Lefevre, [name] 11/09/2021 1:15 PM Medical Record Number: [id number] Patient Account Number: [id number] Date of Birth/Sex: Treating RN: Aug 24, 1955 (66 y.o. [name] Primary Care Malichi Palardy: [name] Other Clinician: Referring Dashonda Bonneau: Treating Chrisandra Wiemers/Extender: [name] in Treatment: 1 Vital  Signs Height(in): 72 Capillary Blood Glucose(mg/dl): 150 Weight(lbs): 300 Pulse(bpm): 62 Body Mass Index(BMI): 40.7 Blood Pressure(mmHg): 103/67 Temperature(F): 98.5 Respiratory Rate(breaths/min): 18 Photos: [2:Right T Great oe] [N/A:N/A N/A] Wound Location: [2:Gradually Appeared] [N/A:N/A] Wounding Event: [2:Diabetic Wound/Ulcer of the Lower] [N/A:N/A] Primary Etiology: [2:Extremity Sleep Apnea, Coronary Artery] [N/A:N/A] Comorbid History: [2:Disease, Hypertension, Type II Diabetes, Osteoarthritis, Neuropathy 01/31/2021] [N/A:N/A] Date Acquired: [2:1] [N/A:N/A] Weeks of Treatment: [2:Open] [N/A:N/A] Wound Status: [2:No] [N/A:N/A] Wound Recurrence: [2:0.1x0.1x0.1] [N/A:N/A] Measurements L x W x D (cm) [2:0.008] [N/A:N/A] A (cm) :  rea [2:0.001] [N/A:N/A] Volume (cm) : [2:98.00%] [N/A:N/A] % Reduction in A [2:rea: 99.40%] [N/A:N/A] % Reduction in Volume: [2:Grade 2] [N/A:N/A] Classification: [2:Medium] [N/A:N/A] Exudate A mount: [2:Serosanguineous] [N/A:N/A] Exudate Type: [2:red, brown] [N/A:N/A] Exudate Color: [2:Thickened] [N/A:N/A] Wound Margin: [2:Large (67-100%)] [N/A:N/A] Granulation A mount: [2:Pink, Pale] [N/A:N/A] Granulation Quality: [2:None Present (0%)] [N/A:N/A] Necrotic A mount: [2:Fat Layer (Subcutaneous Tissue): Yes N/A] Exposed Structures: [2:Fascia: No Tendon: No Muscle: No Joint: No Bone: No Large (67-100%)] [N/A:N/A] Epithelialization: [2:Debridement - Selective/Open Wound N/A] Debridement: Pre-procedure Verification/Time Out 13:55 [N/A:N/A] Taken: [2:Callus] [N/A:N/A] Tissue Debrided: [2:Skin/Epidermis] [N/A:N/A] Level: [2:4] [N/A:N/A] Debridement A (sq cm): [2:rea Curette] [N/A:N/A] Instrument: [2:Minimum] [N/A:N/A] Bleeding: [2:Pressure] [N/A:N/A] Hemostasis A chieved: [2:Procedure was tolerated well] [N/A:N/A] Debridement Treatment Response: [2:0.1x0.3x0.1] [N/A:N/A] Post Debridement Measurements L x W x D (cm) [2:0.002] [N/A:N/A] Post  Debridement Volume: (cm) [2:Debridement] [N/A:N/A] Treatment Notes Wound #2 (Toe Great) Wound Laterality: Right Cleanser Normal Saline Discharge Instruction: Cleanse the wound with Normal Saline prior to applying a clean dressing using gauze sponges, not tissue or cotton balls. Soap and Water Discharge Instruction: May shower and wash wound with dial antibacterial soap and water prior to dressing change. Peri-Wound Care Topical Primary Dressing KerraCel Ag Gelling Fiber Dressing, 2x2 in (silver alginate) Discharge Instruction: Apply silver alginate to wound bed as instructed Secondary Dressing Optifoam Non-Adhesive Dressing, 4x4 in Discharge Instruction: Apply over primary dressing cut to make foam donut Woven Gauze Sponge, Non-Sterile 4x4 in Discharge Instruction: Apply over primary dressing as directed. Secured With Conforming Stretch Gauze Bandage, Sterile 2x75 (in/in) Discharge Instruction: Secure with stretch gauze as directed. 59M Medipore Soft Cloth Surgical T 2x10 (in/yd) ape Discharge Instruction: Secure with tape as directed. Compression Wrap Compression Stockings Add-Ons Electronic Signature(s) Signed: 11/09/2021 2:27:16 PM By: [name] MD FACS Signed: 11/09/2021 6:06:15 PM By: Baruch Gouty RN, BSN Entered By: [name] on 11/09/2021 14:27:15 -------------------------------------------------------------------------------- Multi-Disciplinary Care Plan Details Patient Name: Date of Service: [name] Lefevre, [name] 11/09/2021 1:15 PM Medical Record Number: [id number] Patient Account Number: [id number] Date of Birth/Sex: Treating RN: Sep 18, 1955 (66 y.o. [name] Primary Care Burlie Cajamarca: [name] Other Clinician: Referring Keilyn Nadal: Treating Shady Padron/Extender: [name] in Treatment: 1 Active Inactive Wound/Skin Impairment Nursing Diagnoses: Impaired tissue integrity Knowledge deficit related to  ulceration/compromised skin integrity Goals: Ulcer/skin breakdown will have a volume reduction of 30% by week 4 Date Initiated: 11/01/2021 Target Resolution Date: 11/29/2021 Goal Status: Active Interventions: Assess patient/caregiver ability to obtain necessary supplies Assess patient/caregiver ability to perform ulcer/skin care regimen upon admission and as needed Assess ulceration(s) every visit Notes: Electronic Signature(s) Signed: 11/09/2021 5:38:25 PM By: Levan Hurst RN, BSN Entered By: Levan Hurst on 11/09/2021 13:41:17 -------------------------------------------------------------------------------- Pain Assessment Details Patient Name: Date of Service: [name], Angela Burke S. 11/09/2021 1:15 PM Medical Record Number: [id number] Patient Account Number: [id number] Date of Birth/Sex: Treating RN: Jan 08, 1956 (67 y.o. [name] Primary Care Sat[place] Parco: [name] Other Clinician: Referring Zaakirah Kistner: Treating Bambie Pizzolato/Extender: [name] in Treatment: 1 Active Problems Location of Pain Severity and Description of Pain Patient Has Paino No Site Locations Pain Management and Medication Current Pain Management: Electronic Signature(s) Signed: 11/09/2021 5:38:25 PM By: Levan Hurst RN, BSN Entered By: Levan Hurst on 11/09/2021 13:34:34 -------------------------------------------------------------------------------- Patient/Caregiver Education Details Patient Name: Date of Service: [name], Jens Som 5/25/2023andnbsp1:15 PM Medical Record Number: [id number] Patient Account Number: [id number] Date of Birth/Gender: Treating RN: [birth year] (66 y.o. [name] Primary  Care Physician: [name] Other Clinician: Referring Physician: Treating Physician/Extender: [name] in Treatment: 1 Education Assessment Education Provided To: Patient Education Topics Provided Wound/Skin  Impairment: Methods: Explain/Verbal Responses: State content correctly Electronic Signature(s) Signed: 11/09/2021 5:38:25 PM By: Levan Hurst RN, BSN Entered By: Levan Hurst on 11/09/2021 13:41:27 -------------------------------------------------------------------------------- Wound Assessment Details Patient Name: Date of Service: [name] Lefevre, [name] 11/09/2021 1:15 PM Medical Record Number: [id number] Patient Account Number: [id number] Date of Birth/Sex: Treating RN: 22-Aug-1955 (66 y.o. [name] Primary Care Priest Lockridge: [name] Other Clinician: Referring Meher Kucinski: Treating Eual Lindstrom/Extender: [name] in Treatment: 1 Wound Status Wound Number: 2 Primary Diabetic Wound/Ulcer of the Lower Extremity Etiology: Wound Location: Right T Great oe Wound Open Wounding Event: Gradually Appeared Status: Date Acquired: 01/31/2021 Comorbid Sleep Apnea, Coronary Artery Disease, Hypertension, Type II Weeks Of Treatment: 1 History: Diabetes, Osteoarthritis, Neuropathy Clustered Wound: No Photos Wound Measurements Length: (cm) 0.1 Width: (cm) 0.1 Depth: (cm) 0.1 Area: (cm) 0.008 Volume: (cm) 0.001 % Reduction in Area: 98% % Reduction in Volume: 99.4% Epithelialization: Large (67-100%) Tunneling: No Undermining: No Wound Description Classification: Grade 2 Wound Margin: Thickened Exudate Amount: Medium Exudate Type: Serosanguineous Exudate Color: red, brown Foul Odor After Cleansing: No Slough/Fibrino Yes Wound Bed Granulation Amount: Large (67-100%) Exposed Structure Granulation Quality: Pink, Pale Fascia Exposed: No Necrotic Amount: None Present (0%) Fat Layer (Subcutaneous Tissue) Exposed: Yes Tendon Exposed: No Muscle Exposed: No Joint Exposed: No Bone Exposed: No Treatment Notes Wound #2 (Toe Great) Wound Laterality: Right Cleanser Normal Saline Discharge Instruction: Cleanse the wound with Normal Saline prior  to applying a clean dressing using gauze sponges, not tissue or cotton balls. Soap and Water Discharge Instruction: May shower and wash wound with dial antibacterial soap and water prior to dressing change. Peri-Wound Care Topical Primary Dressing KerraCel Ag Gelling Fiber Dressing, 2x2 in (silver alginate) Discharge Instruction: Apply silver alginate to wound bed as instructed Secondary Dressing Optifoam Non-Adhesive Dressing, 4x4 in Discharge Instruction: Apply over primary dressing cut to make foam donut Woven Gauze Sponge, Non-Sterile 4x4 in Discharge Instruction: Apply over primary dressing as directed. Secured With Conforming Stretch Gauze Bandage, Sterile 2x75 (in/in) Discharge Instruction: Secure with stretch gauze as directed. 83M Medipore Soft Cloth Surgical T 2x10 (in/yd) ape Discharge Instruction: Secure with tape as directed. Compression Wrap Compression Stockings Add-Ons Electronic Signature(s) Signed: 11/09/2021 5:38:25 PM By: Levan Hurst RN, BSN Entered By: Levan Hurst on 11/09/2021 13:39:26 -------------------------------------------------------------------------------- Vitals Details Patient Name: Date of Service: [name], [name] 11/09/2021 1:15 PM Medical Record Number: [id number] Patient Account Number: [id number] Date of Birth/Sex: Treating RN: 02/23/1956 (66 y.o. [name] Primary Care Aundrea Horace: [name] Other Clinician: Referring Aryelle Figg: Treating Dayshaun Whobrey/Extender: [name] in Treatment: 1 Vital Signs Time Taken: 13:33 Temperature (F): 98.5 Height (in): 72 Pulse (bpm): 74 Weight (lbs): 300 Respiratory Rate (breaths/min): 18 Body Mass Index (BMI): 40.7 Blood Pressure (mmHg): 103/67 Capillary Blood Glucose (mg/dl): 150 Reference Range: 80 - 120 mg / dl Notes glucose per pt report this AM Electronic Signature(s) Signed: 11/09/2021 5:38:25 PM By: Levan Hurst RN, BSN Entered By: Levan Hurst on 11/09/2021 13:34:18

## 2021-11-09 NOTE — Progress Notes (Signed)
[name], [name] ([id number]) Visit Report for 11/09/2021 Chief Complaint Document Details Patient Name: Date of Service: [name], [place] 11/09/2021 1:15 PM Medical Record Number: [id number] Patient Account Number: [id number] Date of Birth/Sex: Treating RN: 07/05/1955 (66 y.o. [name] Primary Care Provider: [name] Other Clinician: Referring Provider: Treating Provider/Extender: [name] in Treatment: 1 Information Obtained from: Patient Chief Complaint Right great toe ulcer Electronic Signature(s) Signed: 11/09/2021 2:27:21 PM By: [name] MD FACS Entered By: [name] on 11/09/2021 14:27:21 -------------------------------------------------------------------------------- Debridement Details Patient Name: Date of Service: [name] Lefevre, [name] 11/09/2021 1:15 PM Medical Record Number: [id number] Patient Account Number: [id number] Date of Birth/Sex: Treating RN: 1955/12/10 (66 y.o. [name] Primary Care Provider: [name] Other Clinician: Referring Provider: Treating Provider/Extender: [name] in Treatment: 1 Debridement Performed for Assessment: Wound #2 Right T Great oe Performed By: Physician [name], MD Debridement Type: Debridement Severity of Tissue Pre Debridement: Fat layer exposed Level of Consciousness (Pre-procedure): Awake and Alert Pre-procedure Verification/Time Out Yes - 13:55 Taken: Start Time: 13:58 T Area Debrided (L x W): otal 2 (cm) x 2 (cm) = 4 (cm) Tissue and other material debrided: Non-Viable, Callus, Skin: Epidermis Level: Skin/Epidermis Debridement Description: Selective/Open Wound Instrument: Curette Bleeding: Minimum Hemostasis Achieved: Pressure Response to Treatment: Procedure was tolerated well Level of Consciousness (Post- Awake and Alert procedure): Post Debridement Measurements of Total Wound Length: (cm)  0.1 Width: (cm) 0.3 Depth: (cm) 0.1 Volume: (cm) 0.002 Character of Wound/Ulcer Post Debridement: Improved Severity of Tissue Post Debridement: Fat layer exposed Post Procedure Diagnosis Same as Pre-procedure Electronic Signature(s) Signed: 11/09/2021 2:38:58 PM By: [name] MD FACS Signed: 11/09/2021 6:06:15 PM By: Baruch Gouty RN, BSN Entered By: Baruch Gouty on 11/09/2021 14:04:23 -------------------------------------------------------------------------------- HPI Details Patient Name: Date of Service: [name] Lefevre, [name] 11/09/2021 1:15 PM Medical Record Number: [id number] Patient Account Number: [id number] Date of Birth/Sex: Treating RN: 1955/12/13 (66 y.o. [name] Primary Care Provider: [name] Other Clinician: Referring Provider: Treating Provider/Extender: [name] in Treatment: 1 History of Present Illness HPI Description: 01/04/2021 upon evaluation today patient presents for initial evaluation here in our clinic concerning issues that he has been having with a plantar toe ulceration of the right foot. He tells me this has been going on since around January 2022 when he was on a trip to [place]. Subsequently following he then went to [place] where he had that received care multiple times during the time he was in [place]. He states that [place] seem to be the best as far as that was concerned. With that being said he was initially placed on clindamycin, then doxycycline during the course of this which he feels like did not do as well, and then subsequently he has been on clindamycin which she has 4 days left of at this point. No one has cultured anything up to this point. He has been seeing podiatry and currently Dr. Earleen Newport is to he is seeing. With that being said he does have x-rays that were really not conclusive for any signs of osteomyelitis. He does have an MRI scheduled for this coming Friday which is just 2 days  away. He did have ABIs which were performed on 12/29/2020 and subsequently this did reveal that he has excellent arterial flow with his findings being normal pretty much across the board. I am very pleased in that  regard. His ABI on the right was 1.22 with a TBI of 0.93 and on the left was 1.29 with a TBI of 1.08. Waveforms were triphasic throughout. The patient tells me he is currently been cleaning this with either alcohol or peroxide and then applying Silvadene cream to this and a small dab and wrapping this. 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 Otherwise he is a type II diabetic. 01/11/2021 upon evaluation today patient appears to be doing well with regard to his wound. He is showing signs of improvement which is great news and overall very pleased with where things stand today. There does not appear to be any evidence of active infection at this time which is great news as well. In general I think that the patient is on the right track. His MRI which was actually ordered by Dr. Earleen Newport was negative for any signs of osteomyelitis that is great news. Overall I think that this is going ahead on the right direction with the appropriate care. 01/18/2021 upon evaluation today patient appears to be doing well currently in regard to his toe ulcer. There is a little bit of callus around the edges of the wound but nothing nearly as significant as what we previously noted. Fortunately there does not appear to be any signs of infection either which is also good news. I think that the patient is headed in the appropriate direction. 02/01/2021 upon evaluation today patient appears to be doing actually decently well in regard to his toe ulcer. This is actually not larger than last week although last week he had much more callus covering over the wound that we had to clear away. Once we  removed all that the wound was actually a bit bigger but nonetheless look clean and I was very happy with it. T oday unfortunately he does have some callus still building up this is evidence that he still having a lot of friction and pressure happening to the toe region. I explained to the patient that this is something that we definitely need to try to manage more effectively. Again I explained that in particular I believe that a total contact cast would be the best way to try to get this healed as quickly as possible. I have seen patients actually healed quite rapidly even in just a couple of weeks with this. Again is always difficult to say exactly how quickly someone will heal but nonetheless I believe that the cast is something that we need to have on the radar potentially even for next week. 02/08/2021 upon evaluation today patient's wound is actually showing signs of doing a little bit better compared to last week as far as callus buildup there is less callus than noted previously. With that being said I actually believe that he would benefit from application of the total contact cast has been discussed previously and I think that we should go ahead and see about applying that today. He is reluctantly to some degree but nonetheless in agreement with trying to get this healed and if it takes the cast he tells me he will do it. With all that being said I think the cast is going to be his optimal option and the gold standard for getting this healed at this point. 8/26; patient presents for his first cast change. He reports multiple issues with the cast in the  past 2 days. His main concerns are bruising to his leg and ankle. He states that the cast shifts around his foot. He also states he has had close encounters with falls in the past 2 days. Overall he feels well and denies signs of infection. 02/05/2021 upon evaluation today patient appears to be doing poorly in regard to his wound. Unfortunately  he really is not showing a lot of signs of improvement which he has not had the cast on since Friday after that first 2 days the cast change he really was not keen on putting the cast back on. Nonetheless unfortunately he had some issues with a little bit of rubbing we could definitely pad some of the areas of the right now he has an infection somewhere definitely not can be putting the cast back on. He showed me the pictures from Friday which actually did not appear to be doing too poorly at all from the standpoint of infection. I did not see anything at that time. Subsequently over the next couple of days since Friday by Sunday he had some signs of cellulitis on the distal portion of his foot interestingly this does not include the toe where the wound is. By Monday this was spreading further up the foot and he did end up going to see Dr. Amalia Hailey who was to send in doxycycline for him. I am not sure what happened but it appears Dr. Amalia Hailey did not get that prescription sent in on Monday patient contacted the office on Tuesday morning and they got him the antibiotic Tuesday morning I think Dr. Earleen Newport sent this in for him. Subsequently he took 2 doses of this yesterday and then 1 dose this morning. Nonetheless he does have some areas of streaking coming further up the foot that was not there even yesterday on the picture that he showed me. This does have me concerned for the possibility of the infection spreading. Nonetheless he has not developed any systemic symptoms such as malaise, body aches, fevers, chills, nausea, vomiting, or diarrhea. Nonetheless if any of these occur I explained that he should definitely go to the ER on the market as well today where the redness is and if anything goes outside of that area he should go to the ER. READMISSION 11/01/2021 The patient returns to clinic today for continued management of the same wound for which he was seen last summer. He had been following with Dr.  Earleen Newport in podiatry and actually saw him yesterday. He reports that Dr. Earleen Newport debrided the wound. The patient has been applying Silvadene after cleaning the wound with isopropyl alcohol. Apparently he has been on various courses of antibiotics since the wound first appeared about a year and a half ago. He is currently taking doxycycline and has been on this for 4 to 5 days; he is to start ciprofloxacin shortly. I do not have any culture data upon which to base this choice of antibiotics. He did have a plain film performed of his foot in Dr. Pasty Arch office yesterday. I reviewed this and do not see any signs of osteomyelitis. On his right great toe plantar surface, there is substantial buildup of callus with a slit-like opening visible. He also has denuded skin around the base of this toe where he says he pulled on a small blister which opened up the area. He is not experiencing any fevers or chills. The wound does not cause him pain. 11/09/2021: The wound is markedly smaller today. It looks like the  epithelium is creeping down into the slit-like opening. There is some dark staining around the perimeter of the wound that may be secondary to the silver dressing. Despite being instructed not to use isopropyl alcohol on his wound, he continues to swab the area surrounding it and this is quite dry and callused today. The actual opening in the skin is a pinpoint. Electronic Signature(s) Signed: 11/09/2021 2:28:58 PM By: [name] MD FACS Entered By: [name] on 11/09/2021 14:28:58 -------------------------------------------------------------------------------- Physical Exam Details Patient Name: Date of Service: [name], Angela Burke S. 11/09/2021 1:15 PM Medical Record Number: [id number] Patient Account Number: [id number] Date of Birth/Sex: Treating RN: 1956-03-19 (66 y.o. [name] Primary Care Provider: [name] Other Clinician: Referring Provider: Treating  Provider/Extender: [name] in Treatment: 1 Constitutional . . . . No acute distress. Respiratory Normal work of breathing on room air. Notes 11/09/2021: The wound on the plantar surface of his right great toe and is epithelializing down into the slit-like opening. There is just a pinpoint defect in the skin. The surrounding tissue is quite desiccated and callused. Electronic Signature(s) Signed: 11/09/2021 2:30:07 PM By: [name] MD FACS Entered By: [name] on 11/09/2021 14:30:06 -------------------------------------------------------------------------------- Physician Orders Details Patient Name: Date of Service: [name] Lefevre, [name] 11/09/2021 1:15 PM Medical Record Number: [id number] Patient Account Number: [id number] Date of Birth/Sex: Treating RN: 03-Jul-1955 (66 y.o. [name] Primary Care Provider: [name] Other Clinician: Referring Provider: Treating Provider/Extender: [name] in Treatment: 1 Verbal / Phone Orders: No Diagnosis Coding ICD-10 Coding Code Description [phone] Non-pressure chronic ulcer of other part of right foot with fat layer exposed E11.621 Type 2 diabetes mellitus with foot ulcer Z68.41 Body mass index [BMI]40.0-44.9, adult I50.30 Unspecified diastolic (congestive) heart failure Follow-up Appointments ppointment in 1 week. - Dr. Celine Ahr - room 1 Return A Thurs 6/1 @ 1:15 pm Bathing/ Shower/ Hygiene May shower and wash wound with soap and water. - use liquid dial antibacterial soap Edema Control - Lymphedema / SCD / Other Bilateral Lower Extremities Elevate legs to the level of the heart or above for 30 minutes daily and/or when sitting, a frequency of: - 2-3 times per day Avoid standing for long periods of time. Off-Loading Wound #2 Right T Great oe Open toe surgical shoe to: - front off-loading shoe to right foot Wound Treatment Wound #2 - T Great oe  Wound Laterality: Right Cleanser: Normal Saline 1 x Per Day Discharge Instructions: Cleanse the wound with Normal Saline prior to applying a clean dressing using gauze sponges, not tissue or cotton balls. Cleanser: Soap and Water 1 x Per Day Discharge Instructions: May shower and wash wound with dial antibacterial soap and water prior to dressing change. Prim Dressing: KerraCel Ag Gelling Fiber Dressing, 2x2 in (silver alginate) 1 x Per Day ary Discharge Instructions: Apply silver alginate to wound bed as instructed Secondary Dressing: Optifoam Non-Adhesive Dressing, 4x4 in 1 x Per Day Discharge Instructions: Apply over primary dressing cut to make foam donut Secondary Dressing: Woven Gauze Sponge, Non-Sterile 4x4 in 1 x Per Day Discharge Instructions: Apply over primary dressing as directed. Secured With: Child psychotherapist, Sterile 2x75 (in/in) 1 x Per Day Discharge Instructions: Secure with stretch gauze as directed. Secured With: 60M Medipore Public affairs consultant Surgical T 2x10 (in/yd) 1 x Per Day ape Discharge Instructions: Secure with tape as directed. Electronic Signature(s) Signed: 11/09/2021 2:38:58 PM By: [name]  MD FACS Entered By: [name] on 11/09/2021 14:30:22 -------------------------------------------------------------------------------- Problem List Details Patient Name: Date of Service: [name], [place] 11/09/2021 1:15 PM Medical Record Number: [id number] Patient Account Number: [id number] Date of Birth/Sex: Treating RN: 02-18-1956 (66 y.o. Janyth Contes Primary Care Provider: [name] Other Clinician: Referring Provider: Treating Provider/Extender: [name] in Treatment: 1 Active Problems ICD-10 Encounter Code Description Active Date MDM Diagnosis [phone] Non-pressure chronic ulcer of other part of right foot with fat layer exposed 11/01/2021 No Yes E11.621 Type 2 diabetes mellitus with foot  ulcer 11/01/2021 No Yes Z68.41 Body mass index [BMI]40.0-44.9, adult 11/01/2021 No Yes I50.30 Unspecified diastolic (congestive) heart failure 11/01/2021 No Yes Inactive Problems Resolved Problems Electronic Signature(s) Signed: 11/09/2021 2:27:08 PM By: [name] MD FACS Entered By: [name] on 11/09/2021 14:27:08 -------------------------------------------------------------------------------- Progress Note Details Patient Name: Date of Service: [name] Lefevre, [name] 11/09/2021 1:15 PM Medical Record Number: [id number] Patient Account Number: [id number] Date of Birth/Sex: Treating RN: [birth year] (66 y.o. [name] Primary Care Provider: [name] Other Clinician: Referring Provider: Treating Provider/Extender: [name] in Treatment: 1 Subjective Chief Complaint Information obtained from Patient Right great toe ulcer History of Present Illness (HPI) 01/04/2021 upon evaluation today patient presents for initial evaluation here in our clinic concerning issues that he has been having with a plantar toe ulceration of the right foot. He tells me this has been going on since around January 2022 when he was on a trip to [place]. Subsequently following he then went to [place] where he had that received care multiple times during the time he was in [place]. He states that [place] seem to be the best as far as that was concerned. With that being said he was initially placed on clindamycin, then doxycycline during the course of this which he feels like did not do as well, and then subsequently he has been on clindamycin which she has 4 days left of at this point. No one has cultured anything up to this point. He has been seeing podiatry and currently Dr. Earleen Newport is to he is seeing. With that being said he does have x-rays that were really not conclusive for any signs of osteomyelitis. He does have an MRI scheduled for this coming Friday which is  just 2 days away. He did have ABIs which were performed on 12/29/2020 and subsequently this did reveal that he has excellent arterial flow with his findings being normal pretty much across the board. I am very pleased in that regard. His ABI on the right was 1.22 with a TBI of 0.93 and on the left was 1.29 with a TBI of 1.08. Waveforms were triphasic throughout. The patient tells me he is currently been cleaning this with either alcohol or peroxide and then applying Silvadene cream to this and a small dab and wrapping this. 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 Otherwise he is a type II diabetic. 01/11/2021 upon evaluation today patient appears to be doing well with regard to his wound. He is showing signs of improvement which is great news and overall very pleased with where things stand today. There does not appear to be any evidence of active infection at this time which is great news as well. In general I think that the patient is  on the right track. His MRI which was actually ordered by Dr. Earleen Newport was negative for any signs of osteomyelitis that is great news. Overall I think that this is going ahead on the right direction with the appropriate care. 01/18/2021 upon evaluation today patient appears to be doing well currently in regard to his toe ulcer. There is a little bit of callus around the edges of the wound but nothing nearly as significant as what we previously noted. Fortunately there does not appear to be any signs of infection either which is also good news. I think that the patient is headed in the appropriate direction. 02/01/2021 upon evaluation today patient appears to be doing actually decently well in regard to his toe ulcer. This is actually not larger than last week although last week he had much more callus covering over the wound that we had to clear  away. Once we removed all that the wound was actually a bit bigger but nonetheless look clean and I was very happy with it. T oday unfortunately he does have some callus still building up this is evidence that he still having a lot of friction and pressure happening to the toe region. I explained to the patient that this is something that we definitely need to try to manage more effectively. Again I explained that in particular I believe that a total contact cast would be the best way to try to get this healed as quickly as possible. I have seen patients actually healed quite rapidly even in just a couple of weeks with this. Again is always difficult to say exactly how quickly someone will heal but nonetheless I believe that the cast is something that we need to have on the radar potentially even for next week. 02/08/2021 upon evaluation today patient's wound is actually showing signs of doing a little bit better compared to last week as far as callus buildup there is less callus than noted previously. With that being said I actually believe that he would benefit from application of the total contact cast has been discussed previously and I think that we should go ahead and see about applying that today. He is reluctantly to some degree but nonetheless in agreement with trying to get this healed and if it takes the cast he tells me he will do it. With all that being said I think the cast is going to be his optimal option and the gold standard for getting this healed at this point. 8/26; patient presents for his first cast change. He reports multiple issues with the cast in the past 2 days. His main concerns are bruising to his leg and ankle. He states that the cast shifts around his foot. He also states he has had close encounters with falls in the past 2 days. Overall he feels well and denies signs of infection. 02/05/2021 upon evaluation today patient appears to be doing poorly in regard to his wound.  Unfortunately he really is not showing a lot of signs of improvement which he has not had the cast on since Friday after that first 2 days the cast change he really was not keen on putting the cast back on. Nonetheless unfortunately he had some issues with a little bit of rubbing we could definitely pad some of the areas of the right now he has an infection somewhere definitely not can be putting the cast back on. He showed me the pictures from Friday which actually did not appear to be doing  too poorly at all from the standpoint of infection. I did not see anything at that time. Subsequently over the next couple of days since Friday by Sunday he had some signs of cellulitis on the distal portion of his foot interestingly this does not include the toe where the wound is. By Monday this was spreading further up the foot and he did end up going to see Dr. Amalia Hailey who was to send in doxycycline for him. I am not sure what happened but it appears Dr. Amalia Hailey did not get that prescription sent in on Monday patient contacted the office on Tuesday morning and they got him the antibiotic Tuesday morning I think Dr. Earleen Newport sent this in for him. Subsequently he took 2 doses of this yesterday and then 1 dose this morning. Nonetheless he does have some areas of streaking coming further up the foot that was not there even yesterday on the picture that he showed me. This does have me concerned for the possibility of the infection spreading. Nonetheless he has not developed any systemic symptoms such as malaise, body aches, fevers, chills, nausea, vomiting, or diarrhea. Nonetheless if any of these occur I explained that he should definitely go to the ER on the market [place] well today where the redness is and if anything goes outside of that area he should go to the ER. READMISSION 11/01/2021 The patient returns to clinic today for continued management of the same wound for which he was seen last summer. He had been  following with Dr. Earleen Newport in podiatry and actually saw him yesterday. He reports that Dr. Earleen Newport debrided the wound. The patient has been applying Silvadene after cleaning the wound with isopropyl alcohol. Apparently he has been on various courses of antibiotics since the wound first appeared about a year and a half ago. He is currently taking doxycycline and has been on this for 4 to 5 days; he is to start ciprofloxacin shortly. I do not have any culture data upon which to base this choice of antibiotics. He did have a plain film performed of his foot in Dr. Pasty Arch office yesterday. I reviewed this and do not see any signs of osteomyelitis. On his right great toe plantar surface, there is substantial buildup of callus with a slit-like opening visible. He also has denuded skin around the base of this toe where he says he pulled on a small blister which opened up the area. He is not experiencing any fevers or chills. The wound does not cause him pain. 11/09/2021: The wound is markedly smaller today. It looks like the epithelium is creeping down into the slit-like opening. There is some dark staining around the perimeter of the wound that may be secondary to the silver dressing. Despite being instructed not to use isopropyl alcohol on his wound, he continues to swab the area surrounding it and this is quite dry and callused today. The actual opening in the skin is a pinpoint. Patient History Information obtained from Patient. Family History Cancer - Father, Hypertension - Siblings, Lung Disease - Siblings, No family history of Diabetes, Heart Disease, Hereditary Spherocytosis, Kidney Disease, Seizures, Stroke, Thyroid Problems, Tuberculosis. Social History Former smoker - Quit 1993, Marital Status - Single, Alcohol Use - Daily - red wine 2-3 glasses, Drug Use - Prior History, Caffeine Use - Daily - coffee, soda. Medical History Respiratory Patient has history of Sleep Apnea Cardiovascular Patient  has history of Coronary Artery Disease, Hypertension Endocrine Patient has history of Type II Diabetes  Musculoskeletal Patient has history of Osteoarthritis Neurologic Patient has history of Neuropathy Medical A Surgical History Notes nd Respiratory Blood Clot Right Lung Cardiovascular AAA Objective Constitutional No acute distress. Vitals Time Taken: 1:33 PM, Height: 72 in, Weight: 300 lbs, BMI: 40.7, Temperature: 98.5 F, Pulse: 74 bpm, Respiratory Rate: 18 breaths/min, Blood Pressure: 103/67 mmHg, Capillary Blood Glucose: 150 mg/dl. General Notes: glucose per pt report this AM Respiratory Normal work of breathing on room air. General Notes: 11/09/2021: The wound on the plantar surface of his right great toe and is epithelializing down into the slit-like opening. There is just a pinpoint defect in the skin. The surrounding tissue is quite desiccated and callused. Integumentary (Hair, Skin) Wound #2 status is Open. Original cause of wound was Gradually Appeared. The date acquired was: 01/31/2021. The wound has been in treatment 1 weeks. The wound is located on the Right T Great. The wound measures 0.1cm length x 0.1cm width x 0.1cm depth; 0.008cm^2 area and 0.001cm^3 volume. There is Fat oe Layer (Subcutaneous Tissue) exposed. There is no tunneling or undermining noted. There is a medium amount of serosanguineous drainage noted. The wound margin is thickened. There is large (67-100%) pink, pale granulation within the wound bed. There is no necrotic tissue within the wound bed. Assessment Active Problems ICD-10 Non-pressure chronic ulcer of other part of right foot with fat layer exposed Type 2 diabetes mellitus with foot ulcer Body mass index [BMI]40.0-44.9, adult Unspecified diastolic (congestive) heart failure Procedures Wound #2 Pre-procedure diagnosis of Wound #2 is a Diabetic Wound/Ulcer of the Lower Extremity located on the Right T Great .Severity of Tissue Pre Debridement  is: oe Fat layer exposed. There was a Selective/Open Wound Skin/Epidermis Debridement with a total area of 4 sq cm performed by [name], MD. With the following instrument(s): Curette to remove Non-Viable tissue/material. Material removed includes Callus and Skin: Epidermis and. No specimens were taken. A time out was conducted at 13:55, prior to the start of the procedure. A Minimum amount of bleeding was controlled with Pressure. The procedure was tolerated well. Post Debridement Measurements: 0.1cm length x 0.3cm width x 0.1cm depth; 0.002cm^3 volume. Character of Wound/Ulcer Post Debridement is improved. Severity of Tissue Post Debridement is: Fat layer exposed. Post procedure Diagnosis Wound #2: Same as Pre-Procedure Plan Follow-up Appointments: Return Appointment in 1 week. - Dr. Celine Ahr - room 1 Thurs 6/1 @ 1:15 pm Bathing/ Shower/ Hygiene: May shower and wash wound with soap and water. - use liquid dial antibacterial soap Edema Control - Lymphedema / SCD / Other: Elevate legs to the level of the heart or above for 30 minutes daily and/or when sitting, a frequency of: - 2-3 times per day Avoid standing for long periods of time. Off-Loading: Wound #2 Right T Great: oe Open toe surgical shoe to: - front off-loading shoe to right foot WOUND #2: - T Great Wound Laterality: Right oe Cleanser: Normal Saline 1 x Per Day/ Discharge Instructions: Cleanse the wound with Normal Saline prior to applying a clean dressing using gauze sponges, not tissue or cotton balls. Cleanser: Soap and Water 1 x Per Day/ Discharge Instructions: May shower and wash wound with dial antibacterial soap and water prior to dressing change. Prim Dressing: KerraCel Ag Gelling Fiber Dressing, 2x2 in (silver alginate) 1 x Per Day/ ary Discharge Instructions: Apply silver alginate to wound bed as instructed Secondary Dressing: Optifoam Non-Adhesive Dressing, 4x4 in 1 x Per Day/ Discharge Instructions: Apply  over primary dressing cut to make foam donut  Secondary Dressing: Woven Gauze Sponge, Non-Sterile 4x4 in 1 x Per Day/ Discharge Instructions: Apply over primary dressing as directed. Secured With: Child psychotherapist, Sterile 2x75 (in/in) 1 x Per Day/ Discharge Instructions: Secure with stretch gauze as directed. Secured With: 64M Medipore Public affairs consultant Surgical T 2x10 (in/yd) 1 x Per Day/ ape Discharge Instructions: Secure with tape as directed. 11/09/2021: The wound on the plantar surface of his right great toe and is epithelializing down into the slit-like opening. There is just a pinpoint defect in the skin. The surrounding tissue is quite desiccated and callused. I used a curette to debride the callus and dry skin from around the wound. The actual wound surface, which is very small, is clean without concern for infection. I reemphasized the need to avoid using isopropyl alcohol as I think this is drying out the periwound excessively. We will continue using silver alginate and the forefoot offloading shoe. Follow-up in 1 week. Electronic Signature(s) Signed: 11/09/2021 2:31:23 PM By: [name] MD FACS Entered By: [name] on 11/09/2021 14:31:23 -------------------------------------------------------------------------------- HxROS Details Patient Name: Date of Service: [name], [name] 11/09/2021 1:15 PM Medical Record Number: [id number] Patient Account Number: [id number] Date of Birth/Sex: Treating RN: 11/11/1955 (66 y.o. [name] Primary Care Provider: [name] Other Clinician: Referring Provider: Treating Provider/Extender: [name] in Treatment: 1 Information Obtained From Patient Respiratory Medical History: Positive for: Sleep Apnea Past Medical History Notes: Blood Clot Right Lung Cardiovascular Medical History: Positive for: Coronary Artery Disease; Hypertension Past Medical History  Notes: AAA Endocrine Medical History: Positive for: Type II Diabetes Time with diabetes: 10-15 Treated with: Insulin Blood sugar tested every day: Yes Tested : every morning Musculoskeletal Medical History: Positive for: Osteoarthritis Neurologic Medical History: Positive for: Neuropathy Immunizations Pneumococcal Vaccine: Received Pneumococcal Vaccination: No Implantable Devices None Family and Social History Cancer: Yes - Father; Diabetes: No; Heart Disease: No; Hereditary Spherocytosis: No; Hypertension: Yes - Siblings; Kidney Disease: No; Lung Disease: Yes - Siblings; Seizures: No; Stroke: No; Thyroid Problems: No; Tuberculosis: No; Former smoker - Quit 1993; Marital Status - Single; Alcohol Use: Daily - red wine 2-3 glasses; Drug Use: Prior History; Caffeine Use: Daily - coffee, soda; Financial Concerns: No; Food, Clothing or Shelter Needs: No; Support System Lacking: No; Transportation Concerns: No Engineer, maintenance) Signed: 11/09/2021 2:38:58 PM By: [name] MD FACS Signed: 11/09/2021 6:06:15 PM By: Baruch Gouty RN, BSN Entered By: [name] on 11/09/2021 14:29:03 -------------------------------------------------------------------------------- SuperBill Details Patient Name: Date of Service: [name] Lefevre, [name] 11/09/2021 Medical Record Number: [id number] Patient Account Number: [id number] Date of Birth/Sex: Treating RN: [birth year] (66 y.o. [name] Primary Care Provider: [name] Other Clinician: Referring Provider: Treating Provider/Extender: [name] in Treatment: 1 Diagnosis Coding ICD-10 Codes Code Description [phone] Non-pressure chronic ulcer of other part of right foot with fat layer exposed E11.621 Type 2 diabetes mellitus with foot ulcer Z68.41 Body mass index [BMI]40.0-44.9, adult I50.30 Unspecified diastolic (congestive) heart failure Facility Procedures CPT4 Code:  [id number] Description: [phone] - DEBRIDE WOUND 1ST 20 SQ CM OR < ICD-10 Diagnosis Description L97.512 Non-pressure chronic ulcer of other part of right foot with fat layer exposed Modifier: Quantity: 1 Physician Procedures : CPT4 Code Description Modifier [id number] - WC PHYS LEVEL 3 - EST PT 25 ICD-10 Diagnosis Description L97.512 Non-pressure chronic ulcer of other part of right foot with fat layer exposed E11.621 Type 2 diabetes mellitus with foot  ulcer Quantity: 1 : [phone] [id number] - WC PHYS DEBR WO ANESTH 20 SQ CM ICD-10 Diagnosis Description L97.512 Non-pressure chronic ulcer of other part of right foot with fat layer exposed Quantity: 1 Electronic Signature(s) Signed: 11/09/2021 2:33:07 PM By: [name] MD FACS Entered By: [name] on 11/09/2021 14:33:06

## 2021-11-10 ENCOUNTER — Encounter: Payer: Self-pay | Admitting: Physical Medicine and Rehabilitation

## 2021-11-10 ENCOUNTER — Encounter
Payer: Medicare Other | Attending: Physical Medicine and Rehabilitation | Admitting: Physical Medicine and Rehabilitation

## 2021-11-10 ENCOUNTER — Telehealth (HOSPITAL_COMMUNITY): Payer: Self-pay

## 2021-11-10 VITALS — BP 102/68 | HR 67 | Ht 72.0 in | Wt 300.0 lb

## 2021-11-10 DIAGNOSIS — R0609 Other forms of dyspnea: Secondary | ICD-10-CM | POA: Diagnosis not present

## 2021-11-10 DIAGNOSIS — U099 Post covid-19 condition, unspecified: Secondary | ICD-10-CM | POA: Diagnosis not present

## 2021-11-10 MED ORDER — ALBUTEROL SULFATE HFA 108 (90 BASE) MCG/ACT IN AERS: 2.0000 | INHALATION_SPRAY | Freq: Four times a day (QID) | RESPIRATORY_TRACT | 5 refills | Status: DC | PRN

## 2021-11-10 MED ORDER — TRELEGY ELLIPTA 100-62.5-25 MCG/ACT IN AEPB: 100.0000 [IU] | INHALATION_SPRAY | Freq: Every day | RESPIRATORY_TRACT | 5 refills | Status: DC

## 2021-11-10 NOTE — Patient Instructions (Signed)
Pt is a 66 yr old male with hx of DM, HTN, diabetic ulcer on R great toe; previous COVID and unprovoked PE in [place]- 7/22- most recently put on Jardiance- latest A1c ~ 7.0-8.0-  OSA on CPAP; AAA; HTN; EF 45%;  Post COVID respiratory SOB/DOE- here for evaluation.   Trelegy-  for mild COPD, as well as Post COVID lung scarring and clinically SOB/DOE.  Will try 100 mcg daily  dose and see how it works for him- has low dose steroid, anticholinergic and long acting breathing meds.   - Wash your mouth after using.   2. Albuterol as needed- 1-2 puffs- take it before exercise-  Until doesn't need- and then just take as needed.   3. T try and help with exercise tolerance- Cannot do aqua therapy- but can do bike- Needs to try recumbent bike- has some on [employer]- can use on couch- do at least 15 minutes daily- get one with adjustable resistance.  Can use ACE wrap to keep feet on pedals.    4.  - can use acapella- Flutter valve- can get online- breathe into at least 3x/day- do at least 5 repetitions/day.    5. Drinks 4-5 glasses of red wine/day. Tries to do instead of opiates.    6. F/U in 3 months- but call me in 1 months to let me know how things going.

## 2021-11-10 NOTE — Progress Notes (Signed)
Subjective:    Patient ID: [name], male    DOB: [birth year], 66 y.o.   MRN: [id number]  HPI   Pt is a 66 yr old male with hx of DM, HTN, diabetic ulcer on R great toe; previous COVID and unprovoked PE in [place]- 7/22- most recently put on Jardiance- latest A1c ~ 7.0-8.0-  OSA on CPAP; AAA; HTN; EF 45%;  Post COVID respiratory SOB/DOE- here for evaluation.   CBGs running ~ 110-115-  since started on Jardiance.  This Am was 150s;   Had COVID at least 1 year ago- had monoclonal Ab and 4 shots for COVID.   Has had heart cath- 90% and 75% blockage- 2 stents placed  Didn't solve SOB- still on Xarelto -for life- 1st PE- but mother died of 2nd PE.    Since had COVID, has significant SOB- always gets W[place]C in airport- doesn't really go to store, but when does, sometimes gets scooter. Last month went to airport- and Cardiology Colen Darling insisted he gets w/c.   Mailbox is 1/10 of a mile from house-   Doesn't feel SOB at rest- but DOE with any activity.  Changing pool hose will make him exhausted for 5 minutes- Gets exhausted/winded from walking from car to front desk.   Saw Pulmonologist- didn't get great results/treatment- Tried Spiriva- didn't help- thinks might have hurt.  Said had a prn inhaler but doesn't know name-      Social Hx:  Has pool-   Pain Inventory Average Pain 3 Pain Right Now 2 My pain is intermittent, dull, and aching  In the last 24 hours, has pain interfered with the following? General activity 3 Relation with others 3 Enjoyment of life 8 What TIME of day is your pain at its worst? night Sleep (in general) Fair  Pain is worse with: sitting and inactivity Pain improves with: rest Relief from Meds: 2  use a cane how many minutes can you walk? 2 MINUTES ability to climb steps?  yes do you drive?  yes  retired  bowel control problems numbness  Any changes since last visit?  no  Any changes since last visit?  no    Family History   Problem Relation Age of Onset   Clotting disorder Mother        lung clot   Mesothelioma Father    Hypertension Brother    Skin cancer Brother    Colon cancer Neg Hx    Social History   Socioeconomic History   Marital status: Married    Spouse name: Not on file   Number of children: 2   Years of education: Not on file   Highest education level: Not on file  Occupational History   Occupation: owner    Comment: [employer]  Tobacco Use   Smoking status: Former    Packs/day: 4.00    Years: 20.00    Pack years: 80.00    Types: Cigarettes    Start date: 07/19/1973    Quit date: 12/13/1991    Years since quitting: 29.9   Smokeless tobacco: Never  Vaping Use   Vaping Use: Never used  Substance and Sexual Activity   Alcohol use: Yes    Alcohol/week: 3.0 - 5.0 standard drinks    Types: 3 - 5 Glasses of wine per week    Comment: takes 3 to 5 glasses of wine nightly    Drug use: No   Sexual activity: Not on file  Other Topics Concern  Not on file  Social History Narrative   Not on file   Social Determinants of Health   Financial Resource Strain: Not on file  Food Insecurity: Not on file  Transportation Needs: Not on file  Physical Activity: Not on file  Stress: Not on file  Social Connections: Not on file   Past Surgical History:  Procedure Laterality Date   CARDIAC CATHETERIZATION     approx 3 years ago    COLONOSCOPY WITH PROPOFOL N/A 03/10/2015   Procedure: COLONOSCOPY WITH PROPOFOL;  Surgeon: Milus Banister, MD;  Location: [facility];  Service: Endoscopy;  Laterality: N/A;   colonscopy      2012   CORONARY ATHERECTOMY N/A 07/18/2021   Procedure: CORONARY ATHERECTOMY;  Surgeon: Jettie Booze, MD;  Location: [facility];  Service: Cardiovascular;  Laterality: N/A;   CORONARY STENT INTERVENTION N/A 07/18/2021   Procedure: CORONARY STENT INTERVENTION;  Surgeon: Jettie Booze, MD;  Location: [facility];  Service: Cardiovascular;   Laterality: N/A;   ESOPHAGOGASTRODUODENOSCOPY (EGD) WITH PROPOFOL N/A 03/10/2015   Procedure: ESOPHAGOGASTRODUODENOSCOPY (EGD) WITH PROPOFOL;  Surgeon: Milus Banister, MD;  Location: [facility];  Service: Endoscopy;  Laterality: N/A;   HERNIA REPAIR     with gastric banding   LAPAROSCOPIC GASTRIC BANDING N/A 04/05/2014   Procedure: LAPAROSCOPIC GASTRIC BANDING;  Surgeon: Pedro Earls, MD;  Location: [facility];  Service: General;  Laterality: N/A;   MENISCUS REPAIR Left    left knee torn meniscus   RIGHT/LEFT HEART CATH AND CORONARY ANGIOGRAPHY N/A 07/18/2021   Procedure: RIGHT/LEFT HEART CATH AND CORONARY ANGIOGRAPHY;  Surgeon: Jettie Booze, MD;  Location: [facility];  Service: Cardiovascular;  Laterality: N/A;   VASECTOMY     Past Medical History:  Diagnosis Date   Abdominal aortic aneurysm (AAA), 30-34 mm diameter ([place]) 06/01/2017   Nml w/ greatest dm 3 cm US Aorta 06/2016, Abd [place] 05/01/2017 showed infrarenal dilatation at 3.4 cm - rec repeat US in 3 yrs.   Arthritis    Cervical disc disease 12/26/2011   Coronary artery disease involving native coronary artery of native heart without angina pectoris 12/01/2016   He had coronary angiography in 2011. There was irregularity within the LAD with up to 30% narrowing. The myocardial perfusion imaging done 11/07/2015 did not demonstrate any evidence of ischemia with a low risk nuclear stress test other than EF estimated at 47%. Chest CT 01/25/2016 showed diffuse coronary artery calcifications with heavy calcifications in the LAD. Pt seen by cardiology Dr. Linard Millers    Diabetes mellitus    Difficult intubation 04/05/2014   Dyslipidemia    Family history of anesthesia complication    pt states took days for his father to awaken after mask was used    Fatty liver 06/01/2017   [place] 05/01/2017   GERD (gastroesophageal reflux disease)    has been having acid reflux since recent endoscopy    H/O hiatal hernia    repair with lap band, now  has ventral hernia midline abd   Hearing loss-aides 03/26/2012   Hyperlipidemia    Hypertension    Morbid obesity ([place])    OSA (obstructive sleep apnea)    Rotator cuff tear    Seasonal allergies    Shortness of breath    pt states related to high BP meds with extended walking or climbling stairs   Sleep apnea    Ht 6' (1.829 m)   BMI 41.50 kg/m  Opioid Risk Score:   Fall Risk Score:  `1  Depression screen [facility] 2/9     11/10/2021    9:51 AM 08/15/2018    9:36 AM 04/30/2018   10:40 AM 10/01/2017    4:48 PM 03/14/2017    9:58 AM 11/17/2016   12:06 PM 08/27/2016   12:05 PM  Depression screen PHQ 2/9  Decreased Interest 2 0 0 0 0 0 0  Down, Depressed, Hopeless 1 0 0 0 0 0 0  PHQ - 2 Score 3 0 0 0 0 0 0  Altered sleeping 2     1   Tired, decreased energy 3     1   Change in appetite 1     1   Feeling bad or failure about yourself  0     0   Trouble concentrating 0     0   Moving slowly or fidgety/restless 1     0   Suicidal thoughts 0     0   PHQ-9 Score 10     3     Review of Systems  Musculoskeletal:  Positive for arthralgias, back pain, gait problem and joint swelling.  All other systems reviewed and are negative.     Objective:   Physical Exam  Awake, alert, appropriate, using single point cane; R foot walking shoe, NAD Wearing [place] shoe  CV: RRR- no M/R/G Pulm: CTA B/L but decreased at bases L>R MS: Ue's 5/5 in Biceps, triceps, WE, grip and finger abduction B/L  LE's HF 4+/5; but otherwise 5/5 in KE/KF DF and PF      Assessment & Plan:   Pt is a 66 yr old male with hx of DM, HTN, diabetic ulcer on R great toe; previous COVID and unprovoked PE in [place]- 7/22- most recently put on Jardiance- latest A1c ~ 7.0-8.0-  OSA on CPAP; AAA; HTN; EF 45%;  Post COVID respiratory SOB/DOE- here for evaluation.   Trelegy-  for mild COPD, as well as Post COVID lung scarring and clinically SOB/DOE.  Will try 100 mcg daily  dose and see how it works for him- has low dose  steroid, anticholinergic and long acting breathing meds.   - Wash your mouth after using.   2. Albuterol as needed- 1-2 puffs- take it before exercise-  Until doesn't need- and then just take as needed.   3. T try and help with exercise tolerance- Cannot do aqua therapy- but can do bike- Needs to try recumbent bike- has some on [employer]- can use on couch- do at least 15 minutes daily- get one with adjustable resistance.  Can use ACE wrap to keep feet on pedals.    4.  - can use acapella- Flutter valve- can get online- breathe into at least 3x/day- do at least 5 repetitions/day.    5. Drinks 4-5 glasses of red wine/day. Tries to do instead of opiates.    6. F/U in 3 months- but call me in 1 months to let me know how things going.    I spent a total of  52   minutes on total care today- >50% coordination of care- due to prolonged d/w pt about breathing issues- as detailed above-

## 2021-11-10 NOTE — Telephone Encounter (Signed)
Pt insurance is active and benefits verified through Medicare a/b Co-pay 0, DED $226/$226 met, out of pocket 0/0 met, co-insurance 20%. no pre-authorization required. Passport, 11/10/2021_0 :39pm, REF# [id number]  2ndary insurance is active and benefits verified through [place]. Co-pay 0, DED 0/0 met, out of pocket 0/0 met, co-insurance 0%. No pre-authorization required.

## 2021-11-14 ENCOUNTER — Encounter (HOSPITAL_BASED_OUTPATIENT_CLINIC_OR_DEPARTMENT_OTHER): Payer: Medicare Other | Admitting: General Surgery

## 2021-11-14 ENCOUNTER — Encounter: Payer: Self-pay | Admitting: Internal Medicine

## 2021-11-14 ENCOUNTER — Telehealth (HOSPITAL_COMMUNITY): Payer: Self-pay

## 2021-11-14 DIAGNOSIS — I5030 Unspecified diastolic (congestive) heart failure: Secondary | ICD-10-CM

## 2021-11-14 NOTE — Telephone Encounter (Signed)
Called patient to see if he was interested in participating in the Cardiac Rehab Program. Patient stated yes. Patient will come in for orientation on 11/21/21 @ 1030AM and will attend the 1:45PM exercise class.   Tourist information centre manager.

## 2021-11-15 ENCOUNTER — Other Ambulatory Visit: Payer: Medicare Other | Admitting: *Deleted

## 2021-11-15 DIAGNOSIS — I5030 Unspecified diastolic (congestive) heart failure: Secondary | ICD-10-CM

## 2021-11-16 ENCOUNTER — Encounter (HOSPITAL_BASED_OUTPATIENT_CLINIC_OR_DEPARTMENT_OTHER): Payer: Medicare Other | Attending: General Surgery | Admitting: General Surgery

## 2021-11-16 ENCOUNTER — Telehealth (HOSPITAL_COMMUNITY): Payer: Self-pay | Admitting: *Deleted

## 2021-11-16 DIAGNOSIS — L97512 Non-pressure chronic ulcer of other part of right foot with fat layer exposed: Secondary | ICD-10-CM | POA: Diagnosis present

## 2021-11-16 DIAGNOSIS — E11621 Type 2 diabetes mellitus with foot ulcer: Secondary | ICD-10-CM | POA: Diagnosis not present

## 2021-11-16 DIAGNOSIS — I5030 Unspecified diastolic (congestive) heart failure: Secondary | ICD-10-CM | POA: Insufficient documentation

## 2021-11-16 DIAGNOSIS — Z6841 Body Mass Index (BMI) 40.0 and over, adult: Secondary | ICD-10-CM | POA: Insufficient documentation

## 2021-11-16 DIAGNOSIS — Z87891 Personal history of nicotine dependence: Secondary | ICD-10-CM | POA: Diagnosis not present

## 2021-11-16 DIAGNOSIS — I110 Hypertensive heart disease with heart failure: Secondary | ICD-10-CM | POA: Insufficient documentation

## 2021-11-16 LAB — BASIC METABOLIC PANEL
BUN/Creatinine Ratio: 13 (ref 10–24)
BUN: 17 mg/dL (ref 8–27)
CO2: 23 mmol/L (ref 20–29)
Calcium: 9 mg/dL (ref 8.6–10.2)
Chloride: 97 mmol/L (ref 96–106)
Creatinine, Ser: 1.33 mg/dL — ABNORMAL HIGH (ref 0.76–1.27)
Glucose: 236 mg/dL — ABNORMAL HIGH (ref 70–99)
Potassium: 3.8 mmol/L (ref 3.5–5.2)
Sodium: 140 mmol/L (ref 134–144)
eGFR: 59 mL/min/{1.73_m2} — ABNORMAL LOW (ref >59)

## 2021-11-16 NOTE — Telephone Encounter (Signed)
-----   Message from [name], MD sent at 11/16/2021  2:49 PM EDT ----- Regarding: RE: Ok to proceed with Cardiac Rehab Yes, he is cleared to begin cardiac rehab without restrictions. --JC ----- Message ----- From: Rowe Pavy, RN Sent: 11/16/2021  12:57 PM EDT To: [name], MD Subject: Madaline Brilliant to proceed with Cardiac Rehab               Dr. Celine Ahr,  The above pt is eligible and tentatively scheduled for cardiac rehab to begin on 11/21/21. Based upon your assessment of his foot wound with his follow up appt at 1:15, ok to proceed with group exercise?    Must be able to wear closed toe shoe during exercise (some patients with permission from provider) have been able to switch to an athletic type shoe upon arrival  - exercise comfortable  and place the medical shoe on at the conclusion of exercise. Would need to be able to bear weight to move about the gym and transition to the equipment.  I appreciate your input!  Cherre Huger, BSN Cardiac and Training and development officer

## 2021-11-20 NOTE — Progress Notes (Signed)
[name], [name] ([id number]) Visit Report for 11/16/2021 Chief Complaint Document Details Patient Name: Date of Service: [name], [name] 11/16/2021 1:15 PM Medical Record Number: [id number] Patient Account Number: [id number] Date of Birth/Sex: Treating RN: 08-Oct-1955 (66 y.o. [name] Primary Care Provider: [name] Other Clinician: Referring Provider: Treating Provider/Extender: [name] in Treatment: 2 Information Obtained from: Patient Chief Complaint Right great toe ulcer Electronic Signature(s) Signed: 11/16/2021 2:42:26 PM By: [name] MD FACS Entered By: [name] on 11/16/2021 14:42:26 -------------------------------------------------------------------------------- Debridement Details Patient Name: Date of Service: [name], [name] 11/16/2021 1:15 PM Medical Record Number: [id number] Patient Account Number: [id number] Date of Birth/Sex: Treating RN: 1955/07/14 (66 y.o. [name] Primary Care Provider: [name] Other Clinician: Referring Provider: Treating Provider/Extender: [name] in Treatment: 2 Debridement Performed for Assessment: Wound #2 Right T Great oe Performed By: Physician [name], MD Debridement Type: Debridement Severity of Tissue Pre Debridement: Fat layer exposed Level of Consciousness (Pre-procedure): Awake and Alert Pre-procedure Verification/Time Out Yes - 13:57 Taken: Start Time: 13:57 Pain Control: Other : benzocaine 20% T Area Debrided (L x W): otal 0.5 (cm) x 0.5 (cm) = 0.25 (cm) Tissue and other material debrided: Non-Viable, Callus Level: Non-Viable Tissue Debridement Description: Selective/Open Wound Instrument: Curette Bleeding: Minimum Hemostasis Achieved: Pressure Procedural Pain: 0 Post Procedural Pain: 0 Response to Treatment: Procedure was tolerated well Level of Consciousness (Post- Awake and  Alert procedure): Post Debridement Measurements of Total Wound Length: (cm) 0.1 Width: (cm) 0.1 Depth: (cm) 0.1 Volume: (cm) 0.001 Character of Wound/Ulcer Post Debridement: Improved Severity of Tissue Post Debridement: Fat layer exposed Post Procedure Diagnosis Same as Pre-procedure Electronic Signature(s) Signed: 11/16/2021 4:49:27 PM By: [name] MD FACS Signed: 11/16/2021 5:59:26 PM By: Sharyn Creamer RN, BSN Entered By: Sharyn Creamer on 11/16/2021 13:59:21 -------------------------------------------------------------------------------- HPI Details Patient Name: Date of Service: [name], [name] 11/16/2021 1:15 PM Medical Record Number: [id number] Patient Account Number: [id number] Date of Birth/Sex: Treating RN: 1956-05-09 (66 y.o. [name] Primary Care Provider: [name] Other Clinician: Referring Provider: Treating Provider/Extender: [name] in Treatment: 2 History of Present Illness HPI Description: 01/04/2021 upon evaluation today patient presents for initial evaluation here in our clinic concerning issues that he has been having with a plantar toe ulceration of the right foot. He tells me this has been going on since around January 2022 when he was on a trip to [place]. Subsequently following he then went to [place] where he had that received care multiple times during the time he was in [place]. He states that [place] seem to be the best as far as that was concerned. With that being said he was initially placed on clindamycin, then doxycycline during the course of this which he feels like did not do as well, and then subsequently he has been on clindamycin which she has 4 days left of at this point. No one has cultured anything up to this point. He has been seeing podiatry and currently Dr. Earleen Newport is to he is seeing. With that being said he does have x-rays that were really not conclusive for any signs of osteomyelitis.  He does have an MRI scheduled for this coming Friday which is just 2 days away. He did have ABIs which were performed on 12/29/2020 and subsequently this did reveal that he has excellent arterial flow with his findings being  normal pretty much across the board. I am very pleased in that regard. His ABI on the right was 1.22 with a TBI of 0.93 and on the left was 1.29 with a TBI of 1.08. Waveforms were triphasic throughout. The patient tells me he is currently been cleaning this with either alcohol or peroxide and then applying Silvadene cream to this and a small dab and wrapping this. 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 Otherwise he is a type II diabetic. 01/11/2021 upon evaluation today patient appears to be doing well with regard to his wound. He is showing signs of improvement which is great news and overall very pleased with where things stand today. There does not appear to be any evidence of active infection at this time which is great news as well. In general I think that the patient is on the right track. His MRI which was actually ordered by Dr. Earleen Newport was negative for any signs of osteomyelitis that is great news. Overall I think that this is going ahead on the right direction with the appropriate care. 01/18/2021 upon evaluation today patient appears to be doing well currently in regard to his toe ulcer. There is a little bit of callus around the edges of the wound but nothing nearly as significant as what we previously noted. Fortunately there does not appear to be any signs of infection either which is also good news. I think that the patient is headed in the appropriate direction. 02/01/2021 upon evaluation today patient appears to be doing actually decently well in regard to his toe ulcer. This is actually not larger than last week although last week he had much  more callus covering over the wound that we had to clear away. Once we removed all that the wound was actually a bit bigger but nonetheless look clean and I was very happy with it. T oday unfortunately he does have some callus still building up this is evidence that he still having a lot of friction and pressure happening to the toe region. I explained to the patient that this is something that we definitely need to try to manage more effectively. Again I explained that in particular I believe that a total contact cast would be the best way to try to get this healed as quickly as possible. I have seen patients actually healed quite rapidly even in just a couple of weeks with this. Again is always difficult to say exactly how quickly someone will heal but nonetheless I believe that the cast is something that we need to have on the radar potentially even for next week. 02/08/2021 upon evaluation today patient's wound is actually showing signs of doing a little bit better compared to last week as far as callus buildup there is less callus than noted previously. With that being said I actually believe that he would benefit from application of the total contact cast has been discussed previously and I think that we should go ahead and see about applying that today. He is reluctantly to some degree but nonetheless in agreement with trying to get this healed and if it takes the cast he tells me he will do it. With all that being said I think the cast is going to be his optimal option and the gold standard for getting this healed at this point. 8/26; patient presents for his  first cast change. He reports multiple issues with the cast in the past 2 days. His main concerns are bruising to his leg and ankle. He states that the cast shifts around his foot. He also states he has had close encounters with falls in the past 2 days. Overall he feels well and denies signs of infection. 02/05/2021 upon evaluation today  patient appears to be doing poorly in regard to his wound. Unfortunately he really is not showing a lot of signs of improvement which he has not had the cast on since Friday after that first 2 days the cast change he really was not keen on putting the cast back on. Nonetheless unfortunately he had some issues with a little bit of rubbing we could definitely pad some of the areas of the right now he has an infection somewhere definitely not can be putting the cast back on. He showed me the pictures from Friday which actually did not appear to be doing too poorly at all from the standpoint of infection. I did not see anything at that time. Subsequently over the next couple of days since Friday by Sunday he had some signs of cellulitis on the distal portion of his foot interestingly this does not include the toe where the wound is. By Monday this was spreading further up the foot and he did end up going to see Dr. Amalia Hailey who was to send in doxycycline for him. I am not sure what happened but it appears Dr. Amalia Hailey did not get that prescription sent in on Monday patient contacted the office on Tuesday morning and they got him the antibiotic Tuesday morning I think Dr. Earleen Newport sent this in for him. Subsequently he took 2 doses of this yesterday and then 1 dose this morning. Nonetheless he does have some areas of streaking coming further up the foot that was not there even yesterday on the picture that he showed me. This does have me concerned for the possibility of the infection spreading. Nonetheless he has not developed any systemic symptoms such as malaise, body aches, fevers, chills, nausea, vomiting, or diarrhea. Nonetheless if any of these occur I explained that he should definitely go to the ER on the market as well today where the redness is and if anything goes outside of that area he should go to the ER. READMISSION 11/01/2021 The patient returns to clinic today for continued management of the same  wound for which he was seen last summer. He had been following with Dr. Earleen Newport in podiatry and actually saw him yesterday. He reports that Dr. Earleen Newport debrided the wound. The patient has been applying Silvadene after cleaning the wound with isopropyl alcohol. Apparently he has been on various courses of antibiotics since the wound first appeared about a year and a half ago. He is currently taking doxycycline and has been on this for 4 to 5 days; he is to start ciprofloxacin shortly. I do not have any culture data upon which to base this choice of antibiotics. He did have a plain film performed of his foot in Dr. Pasty Arch office yesterday. I reviewed this and do not see any signs of osteomyelitis. On his right great toe plantar surface, there is substantial buildup of callus with a slit-like opening visible. He also has denuded skin around the base of this toe where he says he pulled on a small blister which opened up the area. He is not experiencing any fevers or chills. The wound does not cause him  pain. 11/09/2021: The wound is markedly smaller today. It looks like the epithelium is creeping down into the slit-like opening. There is some dark staining around the perimeter of the wound that may be secondary to the silver dressing. Despite being instructed not to use isopropyl alcohol on his wound, he continues to swab the area surrounding it and this is quite dry and callused today. The actual opening in the skin is a pinpoint. 11/16/2021: The wound has closed. Electronic Signature(s) Signed: 11/16/2021 2:43:31 PM By: [name] MD FACS Entered By: [name] on 11/16/2021 14:43:31 -------------------------------------------------------------------------------- Physical Exam Details Patient Name: Date of Service: [name], [name] 11/16/2021 1:15 PM Medical Record Number: [id number] Patient Account Number: [id number] Date of Birth/Sex: Treating RN: 1955/12/05 (66 y.o. [name] Primary Care Provider: [name] Other Clinician: Referring Provider: Treating Provider/Extender: [name] in Treatment: 2 Constitutional . . . . No acute distress. Respiratory Normal work of breathing on room air. Notes 11/16/2021: His wound has closed. Electronic Signature(s) Signed: 11/16/2021 2:44:03 PM By: [name] MD FACS Entered By: [name] on 11/16/2021 14:44:03 -------------------------------------------------------------------------------- Physician Orders Details Patient Name: Date of Service: [name], [name] 11/16/2021 1:15 PM Medical Record Number: [id number] Patient Account Number: [id number] Date of Birth/Sex: Treating RN: 1955-11-17 (66 y.o. [name] Primary Care Provider: [name] Other Clinician: Referring Provider: Treating Provider/Extender: [name] in Treatment: 2 Verbal / Phone Orders: No Diagnosis Coding ICD-10 Coding Code Description [phone] Non-pressure chronic ulcer of other part of right foot with fat layer exposed E11.621 Type 2 diabetes mellitus with foot ulcer Z68.41 Body mass index [BMI]40.0-44.9, adult I50.30 Unspecified diastolic (congestive) heart failure Discharge From [facility] Discharge from [facility] - please reach out to Wound care center if needed in future Bathing/ Shower/ Hygiene May shower and wash wound with soap and water. - use liquid dial antibacterial soap Edema Control - Lymphedema / SCD / Other Bilateral Lower Extremities Elevate legs to the level of the heart or above for 30 minutes daily and/or when sitting, a frequency of: - 2-3 times per day Avoid standing for long periods of time. Moisturize legs daily. - and feet using Eucerin or Aquaphor Electronic Signature(s) Signed: 11/16/2021 2:44:43 PM By: [name] MD FACS Entered By: [name] on 11/16/2021  14:44:43 -------------------------------------------------------------------------------- Problem List Details Patient Name: Date of Service: [name], [name] 11/16/2021 1:15 PM Medical Record Number: [id number] Patient Account Number: [id number] Date of Birth/Sex: Treating RN: 1955-12-23 (66 y.o. [name] Primary Care Provider: [name] Other Clinician: Referring Provider: Treating Provider/Extender: [name] in Treatment: 2 Active Problems ICD-10 Encounter Code Description Active Date MDM Diagnosis [phone] Non-pressure chronic ulcer of other part of right foot with fat layer exposed 11/01/2021 No Yes E11.621 Type 2 diabetes mellitus with foot ulcer 11/01/2021 No Yes Z68.41 Body mass index [BMI]40.0-44.9, adult 11/01/2021 No Yes I50.30 Unspecified diastolic (congestive) heart failure 11/01/2021 No Yes Inactive Problems Resolved Problems Electronic Signature(s) Signed: 11/16/2021 2:41:37 PM By: [name] MD FACS Entered By: [name] on 11/16/2021 14:41:36 -------------------------------------------------------------------------------- Progress Note Details Patient Name: Date of Service: [name], [name] 11/16/2021 1:15 PM Medical Record Number: [id number] Patient Account Number: [id number] Date of Birth/Sex: Treating RN: [birth year] (66 y.o. [name] Primary Care Provider: [name] Other Clinician: Referring Provider: Treating Provider/Extender: Iona Hansen  L Weeks in Treatment: 2 Subjective Chief Complaint Information obtained from Patient Right great toe ulcer History of Present Illness (HPI) 01/04/2021 upon evaluation today patient presents for initial evaluation here in our clinic concerning issues that he has been having with a plantar toe ulceration of the right foot. He tells me this has been going on since around January 2022 when he was on a trip to [place].  Subsequently following he then went to [place] where he had that received care multiple times during the time he was in [place]. He states that [place] seem to be the best as far as that was concerned. With that being said he was initially placed on clindamycin, then doxycycline during the course of this which he feels like did not do as well, and then subsequently he has been on clindamycin which she has 4 days left of at this point. No one has cultured anything up to this point. He has been seeing podiatry and currently Dr. Earleen Newport is to he is seeing. With that being said he does have x-rays that were really not conclusive for any signs of osteomyelitis. He does have an MRI scheduled for this coming Friday which is just 2 days away. He did have ABIs which were performed on 12/29/2020 and subsequently this did reveal that he has excellent arterial flow with his findings being normal pretty much across the board. I am very pleased in that regard. His ABI on the right was 1.22 with a TBI of 0.93 and on the left was 1.29 with a TBI of 1.08. Waveforms were triphasic throughout. The patient tells me he is currently been cleaning this with either alcohol or peroxide and then applying Silvadene cream to this and a small dab and wrapping this. 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 Otherwise he is a type II diabetic. 01/11/2021 upon evaluation today patient appears to be doing well with regard to his wound. He is showing signs of improvement which is great news and overall very pleased with where things stand today. There does not appear to be any evidence of active infection at this time which is great news as well. In general I think that the patient is on the right track. His MRI which was actually ordered by Dr. Earleen Newport was negative for any signs of osteomyelitis that is great  news. Overall I think that this is going ahead on the right direction with the appropriate care. 01/18/2021 upon evaluation today patient appears to be doing well currently in regard to his toe ulcer. There is a little bit of callus around the edges of the wound but nothing nearly as significant as what we previously noted. Fortunately there does not appear to be any signs of infection either which is also good news. I think that the patient is headed in the appropriate direction. 02/01/2021 upon evaluation today patient appears to be doing actually decently well in regard to his toe ulcer. This is actually not larger than last week although last week he had much more callus covering over the wound that we had to clear away. Once we removed all that the wound was actually a bit bigger but nonetheless look clean and I was very happy with it. T oday unfortunately he does have some callus still building up this is evidence that he still having a lot of friction and  pressure happening to the toe region. I explained to the patient that this is something that we definitely need to try to manage more effectively. Again I explained that in particular I believe that a total contact cast would be the best way to try to get this healed as quickly as possible. I have seen patients actually healed quite rapidly even in just a couple of weeks with this. Again is always difficult to say exactly how quickly someone will heal but nonetheless I believe that the cast is something that we need to have on the radar potentially even for next week. 02/08/2021 upon evaluation today patient's wound is actually showing signs of doing a little bit better compared to last week as far as callus buildup there is less callus than noted previously. With that being said I actually believe that he would benefit from application of the total contact cast has been discussed previously and I think that we should go ahead and see about  applying that today. He is reluctantly to some degree but nonetheless in agreement with trying to get this healed and if it takes the cast he tells me he will do it. With all that being said I think the cast is going to be his optimal option and the gold standard for getting this healed at this point. 8/26; patient presents for his first cast change. He reports multiple issues with the cast in the past 2 days. His main concerns are bruising to his leg and ankle. He states that the cast shifts around his foot. He also states he has had close encounters with falls in the past 2 days. Overall he feels well and denies signs of infection. 02/05/2021 upon evaluation today patient appears to be doing poorly in regard to his wound. Unfortunately he really is not showing a lot of signs of improvement which he has not had the cast on since Friday after that first 2 days the cast change he really was not keen on putting the cast back on. Nonetheless unfortunately he had some issues with a little bit of rubbing we could definitely pad some of the areas of the right now he has an infection somewhere definitely not can be putting the cast back on. He showed me the pictures from Friday which actually did not appear to be doing too poorly at all from the standpoint of infection. I did not see anything at that time. Subsequently over the next couple of days since Friday by Sunday he had some signs of cellulitis on the distal portion of his foot interestingly this does not include the toe where the wound is. By Monday this was spreading further up the foot and he did end up going to see Dr. Amalia Hailey who was to send in doxycycline for him. I am not sure what happened but it appears Dr. Amalia Hailey did not get that prescription sent in on Monday patient contacted the office on Tuesday morning and they got him the antibiotic Tuesday morning I think Dr. Earleen Newport sent this in for him. Subsequently he took 2 doses of this yesterday and  then 1 dose this morning. Nonetheless he does have some areas of streaking coming further up the foot that was not there even yesterday on the picture that he showed me. This does have me concerned for the possibility of the infection spreading. Nonetheless he has not developed any systemic symptoms such as malaise, body aches, fevers, chills, nausea, vomiting, or diarrhea. Nonetheless if any of  these occur I explained that he should definitely go to the ER [place] the market [place] well today where the redness is and if anything goes outside of that area he should go to the ER. READMISSION 11/01/2021 The patient returns to clinic today for continued management of the same wound for which he was seen last summer. He had been following with Dr. Earleen Newport in podiatry and actually saw him yesterday. He reports that Dr. Earleen Newport debrided the wound. The patient has been applying Silvadene after cleaning the wound with isopropyl alcohol. Apparently he has been on various courses of antibiotics since the wound first appeared about a year and a half ago. He is currently taking doxycycline and has been on this for 4 to 5 days; he is to start ciprofloxacin shortly. I do not have any culture data upon which to base this choice of antibiotics. He did have a plain film performed of his foot in Dr. Pasty Arch office yesterday. I reviewed this and do not see any signs of osteomyelitis. On his right great toe plantar surface, there is substantial buildup of callus with a slit-like opening visible. He also has denuded skin around the base of this toe where he says he pulled on a small blister which opened up the area. He is not experiencing any fevers or chills. The wound does not cause him pain. 11/09/2021: The wound is markedly smaller today. It looks like the epithelium is creeping down into the slit-like opening. There is some dark staining around the perimeter of the wound that may be secondary to the silver dressing. Despite  being instructed not to use isopropyl alcohol on his wound, he continues to swab the area surrounding it and this is quite dry and callused today. The actual opening in the skin is a pinpoint. 11/16/2021: The wound has closed. Patient History Information obtained from Patient. Family History Cancer - Father, Hypertension - Siblings, Lung Disease - Siblings, No family history of Diabetes, Heart Disease, Hereditary Spherocytosis, Kidney Disease, Seizures, Stroke, Thyroid Problems, Tuberculosis. Social History Former smoker - Quit 1993, Marital Status - Single, Alcohol Use - Daily - red wine 2-3 glasses, Drug Use - Prior History, Caffeine Use - Daily - coffee, soda. Medical History Respiratory Patient has history of Sleep Apnea Cardiovascular Patient has history of Coronary Artery Disease, Hypertension Endocrine Patient has history of Type II Diabetes Musculoskeletal Patient has history of Osteoarthritis Neurologic Patient has history of Neuropathy Medical A Surgical History Notes nd Respiratory Blood Clot Right Lung Cardiovascular AAA Objective Constitutional No acute distress. Vitals Time Taken: 1:46 PM, Height: 72 in, Weight: 300 lbs, BMI: 40.7, Temperature: 98.5 F, Pulse: 73 bpm, Respiratory Rate: 18 breaths/min, Blood Pressure: 104/68 mmHg. Respiratory Normal work of breathing on room air. General Notes: 11/16/2021: His wound has closed. Integumentary (Hair, Skin) Wound #2 status is Healed - Epithelialized. Original cause of wound was Gradually Appeared. The date acquired was: 01/31/2021. The wound has been in treatment 2 weeks. The wound is located on the Right T Great. The wound measures 0cm length x 0cm width x 0cm depth; 0cm^2 area and 0cm^3 volume. oe There is no tunneling or undermining noted. There is a medium amount of serosanguineous drainage noted. The wound margin is thickened. There is no granulation within the wound bed. There is no necrotic tissue within the wound  bed. 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 There was a Selective/Open Wound Non-Viable Tissue Debridement with a total area of 0.25 sq cm performed by [name], MD. With the following instrument(s): Curette to remove Non-Viable tissue/material. Material removed includes Callus after achieving pain control using Other (benzocaine 20%). No specimens were taken. A time out was conducted at 13:57, prior to the start of the procedure. A Minimum amount of bleeding was controlled with Pressure. The procedure was tolerated well with a pain level of 0 throughout and a pain level of 0 following the procedure. Post Debridement Measurements: 0.1cm length x 0.1cm width x 0.1cm depth; 0.001cm^3 volume. Character of Wound/Ulcer Post Debridement is improved. Severity of Tissue Post Debridement is: Fat layer exposed. Post procedure Diagnosis Wound #2: Same as Pre-Procedure Plan Discharge From [facility] Services: Discharge from [place] - please reach out to Wound care center if needed in future Bathing/ Shower/ Hygiene: May shower and wash wound with soap and water. - use liquid dial antibacterial soap Edema Control - Lymphedema / SCD / Other: Elevate legs to the level of the heart or above for 30 minutes daily and/or when sitting, a frequency of: - 2-3 times per day Avoid standing for long periods of time. Moisturize legs daily. - and feet using Eucerin or Aquaphor 11/16/2021: His wound has closed. He will be discharged from the wound care center. I have recommended that he continue to pad the area with a  foam doughnut for the next week or so. He is free to engage in cardiac rehab, wear normal shoes, and weight-bear normally. He will follow-up as needed. Electronic Signature(s) Signed: 11/16/2021 2:47:00 PM By: [name] MD FACS Entered By: [name] on 11/16/2021 14:47:00 -------------------------------------------------------------------------------- HxROS Details Patient Name: Date of Service: [name], [name] 11/16/2021 1:15 PM Medical Record Number: [id number] Patient Account Number: [id number] Date of Birth/Sex: Treating RN: [birth year] (66 y.o. [name] Primary Care Provider: [name] Other Clinician: Referring Provider: Treating Provider/Extender: [name] in Treatment: 2 Information Obtained From Patient Respiratory Medical History: Positive for: Sleep Apnea Past Medical History Notes: Blood Clot Right Lung Cardiovascular Medical History: Positive for: Coronary Artery Disease; Hypertension Past Medical History Notes: AAA Endocrine Medical History: Positive for: Type II Diabetes Time with diabetes: 10-15 Treated with: Insulin Blood sugar tested every day: Yes Tested : every morning Musculoskeletal Medical History: Positive for: Osteoarthritis Neurologic Medical History: Positive for: Neuropathy Immunizations Pneumococcal Vaccine: Received Pneumococcal Vaccination: No Implantable Devices None Family and Social History Cancer: Yes - Father; Diabetes: No; Heart Disease: No; Hereditary Spherocytosis: No; Hypertension: Yes - Siblings; Kidney Disease: No; Lung Disease: Yes - Siblings; Seizures: No; Stroke: No; Thyroid Problems: No; Tuberculosis: No; Former smoker - Quit 1993; Marital Status - Single; Alcohol Use: Daily - red wine 2-3 glasses; Drug Use: Prior History; Caffeine Use: Daily - coffee, soda; Financial Concerns: No; Food, Clothing or Shelter Needs: No; Support System Lacking: No;  Transportation Concerns: No Electronic Signature(s) Signed: 11/16/2021 4:49:27 PM By: [name] MD FACS Signed: 11/20/2021 5:13:49 PM By: Baruch Gouty RN, BSN Entered By: [name] on 11/16/2021 14:43:37 -------------------------------------------------------------------------------- SuperBill Details Patient Name: Date of Service: [name], [name] 11/16/2021 Medical Record Number: [id number] Patient Account Number: [id number] Date of Birth/Sex: Treating  RN: 1955/07/20 (66 y.o. [name] Primary Care Provider: [name] Other Clinician: Referring Provider: Treating Provider/Extender: [name] in Treatment: 2 Diagnosis Coding ICD-10 Codes Code Description [phone] Non-pressure chronic ulcer of other part of right foot with fat layer exposed E11.621 Type 2 diabetes mellitus with foot ulcer Z68.41 Body mass index [BMI]40.0-44.9, adult I50.30 Unspecified diastolic (congestive) heart failure Physician Procedures : CPT4 Code Description Modifier [id number] - WC PHYS LEVEL 3 - EST PT ICD-10 Diagnosis Description L97.512 Non-pressure chronic ulcer of other part of right foot with fat layer exposed Quantity: 1 Electronic Signature(s) Signed: 11/16/2021 2:48:38 PM By: [name] MD FACS Entered By: [name] on 11/16/2021 14:48:38

## 2021-11-20 NOTE — Progress Notes (Addendum)
HARSHAAN, WHANG ([id number]) Visit Report for 11/16/2021 Arrival Information Details Patient Name: Date of Service: [place], [name] 11/16/2021 1:15 PM Medical Record Number: [id number] Patient Account Number: [id number] Date of Birth/Sex: Treating RN: 09-Sep-1955 (66 y.o. [name] Primary Care Slate Debroux: [name] Other Clinician: Referring Juanetta Negash: Treating Javeria Briski/Extender: [name] in Treatment: 2 Visit Information History Since Last Visit Added or deleted any medications: No Patient Arrived: Ambulatory Any new allergies or adverse reactions: No Arrival Time: 13:44 Had a fall or experienced change in No Accompanied By: Self activities of daily living that may affect Transfer Assistance: None risk of falls: Patient Identification Verified: Yes Signs or symptoms of abuse/neglect since last visito No Secondary Verification Process Completed: Yes Hospitalized since last visit: No Patient Requires Transmission-Based Precautions: No Implantable device outside of the clinic excluding No Patient Has Alerts: No cellular tissue based products placed in the center since last visit: Has Dressing in Place as Prescribed: Yes Has Compression in Place as Prescribed: No Has Footwear/Offloading in Place as Prescribed: Yes Right: Wedge Shoe Pain Present Now: No Electronic Signature(s) Signed: 11/16/2021 5:59:26 PM By: Sharyn Creamer RN, BSN Entered By: Sharyn Creamer on 11/16/2021 13:46:12 -------------------------------------------------------------------------------- Clinic Level of Care Assessment Details Patient Name: Date of Service: [name], [place] S. 11/16/2021 1:15 PM Medical Record Number: [id number] Patient Account Number: [id number] Date of Birth/Sex: Treating RN: 1956-04-01 (66 y.o. [name] Primary Care Yonatan Guitron: [name] Other Clinician: Referring Giovana Faciane: Treating Zaina Jenkin/Extender: [name] in Treatment: 2 Clinic Level of Care Assessment Items TOOL 3 Quantity Score X- 1 0 Use when EandM and Procedure is performed on FOLLOW-UP visit ASSESSMENTS - Nursing Assessment / Reassessment X- 1 10 Reassessment of Co-morbidities (includes updates in patient status) X- 1 5 Reassessment of Adherence to Treatment Plan ASSESSMENTS - Wound and Skin Assessment / Reassessment '[]'$  - Points for Wound Assessment can only be taken for a new wound of unknown or different etiology and a procedure is 0 NOT performed to that wound X- 1 5 Simple Wound Assessment / Reassessment - one wound '[]'$  - 0 Complex Wound Assessment / Reassessment - multiple wounds '[]'$  - 0 Dermatologic / Skin Assessment (not related to wound area) ASSESSMENTS - Focused Assessment '[]'$  - 0 Circumferential Edema Measurements - multi extremities '[]'$  - 0 Nutritional Assessment / Counseling / Intervention '[]'$  - 0 Lower Extremity Assessment (monofilament, tuning fork, pulses) '[]'$  - 0 Peripheral Arterial Disease Assessment (using hand held doppler) ASSESSMENTS - Ostomy and/or Continence Assessment and Care '[]'$  - 0 Incontinence Assessment and Management '[]'$  - 0 Ostomy Care Assessment and Management (repouching, etc.) PROCESS - Coordination of Care '[]'$  - Points for Discharge Coordination can only be taken for a new wound of unknown or different etiology and a procedure 0 is NOT performed to that wound X- 1 15 Simple Patient / Family Education for ongoing care '[]'$  - 0 Complex (extensive) Patient / Family Education for ongoing care X- 1 10 Staff obtains Programmer, systems, Records, T Results / Process Orders est '[]'$  - 0 Staff telephones HHA, Nursing Homes / Clarify orders / etc '[]'$  - 0 Routine Transfer to another Facility (non-emergent condition) '[]'$  - 0 Routine Hospital Admission (non-emergent condition) '[]'$  - 0 New Admissions / Biomedical engineer / Ordering NPWT Apligraf, etc. , '[]'$  - 0 Emergency  Hospital Admission (emergent condition) X- 1 10 Simple Discharge Coordination '[]'$  - 0 Complex (extensive) Discharge Coordination PROCESS -  Special Needs '[]'$  - 0 Pediatric / Minor Patient Management '[]'$  - 0 Isolation Patient Management '[]'$  - 0 Hearing / Language / Visual special needs '[]'$  - 0 Assessment of Community assistance (transportation, D/C planning, etc.) '[]'$  - 0 Additional assistance / Altered mentation '[]'$  - 0 Support Surface(s) Assessment (bed, cushion, seat, etc.) INTERVENTIONS - Wound Cleansing / Measurement '[]'$  - Points for Wound Cleaning / Measurement, Wound Dressing, Specimen Collection and Specimen taken to lab can only 0 be taken for a new wound of unknown or different etiology and a procedure is NOT performed to that wound X- 1 5 Simple Wound Cleansing - one wound '[]'$  - 0 Complex Wound Cleansing - multiple wounds X- 1 5 Wound Imaging (photographs - any number of wounds) '[]'$  - 0 Wound Tracing (instead of photographs) X- 1 5 Simple Wound Measurement - one wound '[]'$  - 0 Complex Wound Measurement - multiple wounds INTERVENTIONS - Wound Dressings X - Small Wound Dressing one or multiple wounds 1 10 '[]'$  - 0 Medium Wound Dressing one or multiple wounds '[]'$  - 0 Large Wound Dressing one or multiple wounds INTERVENTIONS - Miscellaneous '[]'$  - 0 External ear exam '[]'$  - 0 Specimen Collection (cultures, biopsies, blood, body fluids, etc.) '[]'$  - 0 Specimen(s) / Culture(s) sent or taken to Lab for analysis '[]'$  - 0 Patient Transfer (multiple staff / Civil Service fast streamer / Similar devices) '[]'$  - 0 Simple Staple / Suture removal (25 or less) '[]'$  - 0 Complex Staple / Suture removal (26 or more) '[]'$  - 0 Hypo / Hyperglycemic Management (close monitor of Blood Glucose) '[]'$  - 0 Ankle / Brachial Index (ABI) - do not check if billed separately X- 1 5 Vital Signs Has the patient been seen at the hospital within the last three years: Yes Total Score: 85 Level Of Care: New/Established - Level  3 Electronic Signature(s) Signed: 03/09/2022 10:28:39 AM By: Sharyn Creamer RN, BSN Entered By: Sharyn Creamer on 12/12/2021 16:57:35 -------------------------------------------------------------------------------- Encounter Discharge Information Details Patient Name: Date of Service: [name], [name] 11/16/2021 1:15 PM Medical Record Number: [id number] Patient Account Number: [id number] Date of Birth/Sex: Treating RN: 11-12-1955 (66 y.o. [name] Primary Care Kelven Flater: [name] Other Clinician: Referring Donivin Wirt: Treating Mariabelen Pressly/Extender: [name] in Treatment: 2 Encounter Discharge Information Items Post Procedure Vitals Discharge Condition: Stable Temperature (F): 98.5 Ambulatory Status: Ambulatory Pulse (bpm): 73 Discharge Destination: Home Respiratory Rate (breaths/min): 18 Transportation: Private Auto Blood Pressure (mmHg): 104/68 Accompanied By: self Schedule Follow-up Appointment: Yes Clinical Summary of Care: Patient Declined Electronic Signature(s) Signed: 11/16/2021 5:59:26 PM By: Sharyn Creamer RN, BSN Entered By: Sharyn Creamer on 11/16/2021 17:47:05 -------------------------------------------------------------------------------- Lower Extremity Assessment Details Patient Name: Date of Service: [name], [name] 11/16/2021 1:15 PM Medical Record Number: [id number] Patient Account Number: [id number] Date of Birth/Sex: Treating RN: 01/23/1956 (66 y.o. [name] Primary Care Kenda Kloehn: [name] Other Clinician: Referring Aviv Rota: Treating Yusra Ravert/Extender: [name] in Treatment: 2 Edema Assessment Assessed: Shirlyn Goltz: No] [Right: No] [Left: Edema] [Right: :] Calf Left: Right: Point of Measurement: 34 cm From Medial Instep 46 cm Ankle Left: Right: Point of Measurement: 12 cm From Medial Instep 29 cm Vascular Assessment Pulses: Dorsalis Pedis Palpable:  [Right:Yes] Electronic Signature(s) Signed: 11/16/2021 5:59:26 PM By: Sharyn Creamer RN, BSN Entered By: Sharyn Creamer on 11/16/2021 13:51:13 -------------------------------------------------------------------------------- Multi Wound Chart Details Patient Name: Date of Service: [name], [name] 11/16/2021 1:15 PM Medical Record  Number: [id number] Patient Account Number: [id number] Date of Birth/Sex: Treating RN: 21-Oct-1955 (66 y.o. [name] Primary Care Caldwell Kronenberger: [name] Other Clinician: Referring Ki Luckman: Treating Orvile Corona/Extender: [name] in Treatment: 2 Vital Signs Height(in): 72 Pulse(bpm): 46 Weight(lbs): 300 Blood Pressure(mmHg): 104/68 Body Mass Index(BMI): 40.7 Temperature(F): 98.5 Respiratory Rate(breaths/min): 18 Photos: [N/A:N/A] Right T Great oe N/A N/A Wound Location: Gradually Appeared N/A N/A Wounding Event: Diabetic Wound/Ulcer of the Lower N/A N/A Primary Etiology: Extremity Sleep Apnea, Coronary Artery N/A N/A Comorbid History: Disease, Hypertension, Type II Diabetes, Osteoarthritis, Neuropathy 01/31/2021 N/A N/A Date Acquired: 2 N/A N/A Weeks of Treatment: Healed - Epithelialized N/A N/A Wound Status: No N/A N/A Wound Recurrence: 0x0x0 N/A N/A Measurements L x W x D (cm) 0 N/A N/A A (cm) : rea 0 N/A N/A Volume (cm) : 100.00% N/A N/A % Reduction in A rea: 100.00% N/A N/A % Reduction in Volume: Grade 2 N/A N/A Classification: Medium N/A N/A Exudate A mount: Serosanguineous N/A N/A Exudate Type: red, brown N/A N/A Exudate Color: Thickened N/A N/A Wound Margin: None Present (0%) N/A N/A Granulation A mount: None Present (0%) N/A N/A Necrotic A mount: Fascia: No N/A N/A Exposed Structures: Fat Layer (Subcutaneous Tissue): No Tendon: No Muscle: No Joint: No Bone: No Large (67-100%) N/A N/A Epithelialization: Debridement - Selective/Open Wound N/A  N/A Debridement: Pre-procedure Verification/Time Out 13:57 N/A N/A Taken: Other N/A N/A Pain Control: Callus N/A N/A Tissue Debrided: Non-Viable Tissue N/A N/A Level: 0.25 N/A N/A Debridement A (sq cm): rea Curette N/A N/A Instrument: Minimum N/A N/A Bleeding: Pressure N/A N/A Hemostasis A chieved: 0 N/A N/A Procedural Pain: 0 N/A N/A Post Procedural Pain: Procedure was tolerated well N/A N/A Debridement Treatment Response: 0.1x0.1x0.1 N/A N/A Post Debridement Measurements L x W x D (cm) 0.001 N/A N/A Post Debridement Volume: (cm) Debridement N/A N/A Procedures Performed: Treatment Notes Electronic Signature(s) Signed: 11/16/2021 2:41:57 PM By: [name] MD FACS Signed: 11/20/2021 5:13:49 PM By: Baruch Gouty RN, BSN Entered By: [name] on 11/16/2021 14:41:57 -------------------------------------------------------------------------------- Multi-Disciplinary Care Plan Details Patient Name: Date of Service: [name], [name] 11/16/2021 1:15 PM Medical Record Number: [id number] Patient Account Number: [id number] Date of Birth/Sex: Treating RN: 04-Dec-1955 (66 y.o. [name] Primary Care Keshawna Dix: [name] Other Clinician: Referring Daisja Kessinger: Treating Shandee Jergens/Extender: [name] in Treatment: 2 Active Inactive Electronic Signature(s) Signed: 11/16/2021 5:59:26 PM By: Sharyn Creamer RN, BSN Entered By: Sharyn Creamer on 11/16/2021 14:10:38 -------------------------------------------------------------------------------- Pain Assessment Details Patient Name: Date of Service: [name], Angela Burke S. 11/16/2021 1:15 PM Medical Record Number: [id number] Patient Account Number: [id number] Date of Birth/Sex: Treating RN: 20-Jun-1955 (66 y.o. [name] Primary Care Kamari Bilek: [name] Other Clinician: Referring Guida Asman: Treating Chai Routh/Extender: [name] in  Treatment: 2 Active Problems Location of Pain Severity and Description of Pain Patient Has Paino No Site Locations Pain Management and Medication Current Pain Management: Electronic Signature(s) Signed: 11/16/2021 5:59:26 PM By: Sharyn Creamer RN, BSN Entered By: Sharyn Creamer on 11/16/2021 13:49:00 -------------------------------------------------------------------------------- Patient/Caregiver Education Details Patient Name: Date of Service: [name], Jens Som 6/1/2023andnbsp1:15 PM Medical Record Number: [id number] Patient Account Number: [id number] Date of Birth/Gender: Treating RN: Sep 24, 1955 (67 y.o. [name] Primary Care Physician: [name] Other Clinician: Referring Physician: Treating Physician/Extender: [name] in Treatment: 2 Education Assessment Education Provided To: Patient Education Topics Provided Wound/Skin Impairment: Methods:  Explain/Verbal Responses: State content correctly Electronic Signature(s) Signed: 11/16/2021 5:59:26 PM By: Sharyn Creamer RN, BSN Entered By: Sharyn Creamer on 11/16/2021 17:45:02 -------------------------------------------------------------------------------- Wound Assessment Details Patient Name: Date of Service: [name], [name] 11/16/2021 1:15 PM Medical Record Number: [id number] Patient Account Number: [id number] Date of Birth/Sex: Treating RN: March 28, 1956 (66 y.o. [name] Primary Care Jerlisa Diliberto: [name] Other Clinician: Referring Major Santerre: Treating Matilde Markie/Extender: [name] in Treatment: 2 Wound Status Wound Number: 2 Primary Diabetic Wound/Ulcer of the Lower Extremity Etiology: Wound Location: Right T Great oe Wound Healed - Epithelialized Wounding Event: Gradually Appeared Status: Date Acquired: 01/31/2021 Comorbid Sleep Apnea, Coronary Artery Disease, Hypertension, Type II Weeks Of Treatment: 2 History:  Diabetes, Osteoarthritis, Neuropathy Clustered Wound: No Photos Wound Measurements Length: (cm) Width: (cm) Depth: (cm) Area: (cm) Volume: (cm) 0 % Reduction in Area: 100% 0 % Reduction in Volume: 100% 0 Epithelialization: Large (67-100%) 0 Tunneling: No 0 Undermining: No Wound Description Classification: Grade 2 Wound Margin: Thickened Exudate Amount: Medium Exudate Type: Serosanguineous Exudate Color: red, brown Foul Odor After Cleansing: No Slough/Fibrino No Wound Bed Granulation Amount: None Present (0%) Exposed Structure Necrotic Amount: None Present (0%) Fascia Exposed: No Fat Layer (Subcutaneous Tissue) Exposed: No Tendon Exposed: No Muscle Exposed: No Joint Exposed: No Bone Exposed: No Electronic Signature(s) Signed: 11/16/2021 5:59:26 PM By: Sharyn Creamer RN, BSN Entered By: Sharyn Creamer on 11/16/2021 14:03:51 -------------------------------------------------------------------------------- Vitals Details Patient Name: Date of Service: [name], [name] 11/16/2021 1:15 PM Medical Record Number: [id number] Patient Account Number: [id number] Date of Birth/Sex: Treating RN: 1955-10-28 (66 y.o. [name] Primary Care Fatoumata Albaugh: [name] Other Clinician: Referring Daliah Chaudoin: Treating Veleria Barnhardt/Extender: [name] in Treatment: 2 Vital Signs Time Taken: 13:46 Temperature (F): 98.5 Height (in): 72 Pulse (bpm): 73 Weight (lbs): 300 Respiratory Rate (breaths/min): 18 Body Mass Index (BMI): 40.7 Blood Pressure (mmHg): 104/68 Reference Range: 80 - 120 mg / dl Electronic Signature(s) Signed: 11/16/2021 5:59:26 PM By: Sharyn Creamer RN, BSN Entered By: Sharyn Creamer on 11/16/2021 13:48:47

## 2021-11-21 ENCOUNTER — Encounter (HOSPITAL_COMMUNITY)
Admission: RE | Admit: 2021-11-21 | Discharge: 2021-11-21 | Disposition: A | Discharge status: Home / Self Care | Payer: Medicare Other | Source: Ambulatory Visit | Attending: Internal Medicine | Admitting: Internal Medicine

## 2021-11-21 ENCOUNTER — Encounter (HOSPITAL_COMMUNITY): Payer: Self-pay

## 2021-11-21 VITALS — BP 110/70 | HR 60 | Ht 70.5 in | Wt 305.6 lb

## 2021-11-21 DIAGNOSIS — Z955 Presence of coronary angioplasty implant and graft: Secondary | ICD-10-CM | POA: Insufficient documentation

## 2021-11-21 DIAGNOSIS — R739 Hyperglycemia, unspecified: Secondary | ICD-10-CM | POA: Diagnosis not present

## 2021-11-21 NOTE — Progress Notes (Signed)
Cardiac Individual Treatment Plan  Patient Details  Name: [name] MRN: [id number] Date of Birth: 24-Feb-1956 Referring Provider:   Flowsheet Row INTENSIVE CARDIAC REHAB ORIENT from 11/21/2021 in [facility]  Referring Provider Werner Lean, MD       Initial Encounter Date:  [facility] from 11/21/2021 in [facility]  Date 11/21/21       Visit Diagnosis: 07/18/21 DES x2 RCA  Patient's Home Medications on Admission:  Current Outpatient Medications:    acetaminophen (TYLENOL) 500 MG tablet, Take 500 mg by mouth every 6 (six) hours as needed (for pain.)., Disp: , Rfl:    albuterol (VENTOLIN HFA) 108 (90 Base) MCG/ACT inhaler, Inhale 2 puffs into the lungs every 6 (six) hours as needed for wheezing or shortness of breath (take as needed for SOB/wheezing). (Patient not taking: Reported on 11/21/2021), Disp: 8 g, Rfl: 5   clopidogrel (PLAVIX) 75 MG tablet, Take 1 tablet (75 mg total) by mouth daily with breakfast., Disp: 90 tablet, Rfl: 3   diltiazem (CARDIZEM CD) 300 MG 24 hr capsule, Take 300 mg by mouth in the morning., Disp: , Rfl:    diphenhydrAMINE (BENADRYL) 25 MG tablet, Take 25 mg by mouth every 6 (six) hours as needed for itching., Disp: , Rfl:    empagliflozin (JARDIANCE) 10 MG TABS tablet, Take 1 tablet (10 mg total) by mouth daily before breakfast., Disp: 90 tablet, Rfl: 3   fluticasone (FLONASE) 50 MCG/ACT nasal spray, Place into the nose as needed., Disp: , Rfl:    Fluticasone-Umeclidin-Vilant (TRELEGY ELLIPTA) 100-62.5-25 MCG/ACT AEPB, Inhale 100 Units into the lungs daily., Disp: 1 each, Rfl: 5   glucose blood (TRUETEST TEST) test strip, Use as instructed, Disp: 100 each, Rfl: 2   insulin degludec (TRESIBA FLEXTOUCH) 200 UNIT/ML FlexTouch Pen, Inject 114 Units into the skin in the morning., Disp: , Rfl:    Insulin Pen Needle (B-D UF III MINI PEN NEEDLES) 31G X 5 MM  MISC, USE AS DIRECTED ONCE A DAY, Disp: 100 each, Rfl: 11   lisinopril (ZESTRIL) 20 MG tablet, Take 1 tablet (20 mg total) by mouth daily., Disp: 90 tablet, Rfl: 3   metoprolol succinate (TOPROL XL) 25 MG 24 hr tablet, Take 1 tablet (25 mg total) by mouth daily., Disp: 90 tablet, Rfl: 3   Multiple Vitamin (MULTIVITAMIN WITH MINERALS) TABS tablet, Take 1 tablet by mouth in the morning., Disp: , Rfl:    Potassium Chloride ER 20 MEQ TBCR, Take 60 mEq by mouth daily. (Patient taking differently: Take 60 mEq by mouth in the morning.), Disp: 180 tablet, Rfl: 3   rivaroxaban (XARELTO) 10 MG TABS tablet, Take 1 tablet (10 mg total) by mouth daily., Disp: 30 tablet, Rfl: 3   rosuvastatin (CRESTOR) 40 MG tablet, Take 1 tablet (40 mg total) by mouth daily., Disp: 90 tablet, Rfl: 3   tamsulosin (FLOMAX) 0.4 MG CAPS capsule, Take 1 capsule (0.4 mg total) by mouth daily after supper., Disp: 90 capsule, Rfl: 0   torsemide (DEMADEX) 20 MG tablet, Take 3 tablets (60 mg total) by mouth daily., Disp: 270 tablet, Rfl: 3  Past Medical History: Past Medical History:  Diagnosis Date   Abdominal aortic aneurysm (AAA), 30-34 mm diameter ([place]) 06/01/2017   Nml w/ greatest dm 3 cm US Aorta 06/2016, Abd [place] 05/01/2017 showed infrarenal dilatation at 3.4 cm - rec repeat US in 3 yrs.   Arthritis  Cervical disc disease 12/26/2011   Coronary artery disease involving native coronary artery of native heart without angina pectoris 12/01/2016   He had coronary angiography in 2011. There was irregularity within the LAD with up to 30% narrowing. The myocardial perfusion imaging done 11/07/2015 did not demonstrate any evidence of ischemia with a low risk nuclear stress test other than EF estimated at 47%. Chest CT 01/25/2016 showed diffuse coronary artery calcifications with heavy calcifications in the LAD. Pt seen by cardiology Dr. Linard Millers    Diabetes mellitus    Difficult intubation 04/05/2014   Dyslipidemia    Family history of  anesthesia complication    pt states took days for his father to awaken after mask was used    Fatty liver 06/01/2017   [place] 05/01/2017   GERD (gastroesophageal reflux disease)    has been having acid reflux since recent endoscopy    H/O hiatal hernia    repair with lap band, now has ventral hernia midline abd   Hearing loss-aides 03/26/2012   Hyperlipidemia    Hypertension    Morbid obesity ([place])    OSA (obstructive sleep apnea)    Rotator cuff tear    Seasonal allergies    Shortness of breath    pt states related to high BP meds with extended walking or climbling stairs   Sleep apnea     Tobacco Use: Social History   Tobacco Use  Smoking Status Former   Packs/day: 4.00   Years: 20.00   Pack years: 80.00   Types: Cigarettes   Start date: 07/19/1973   Quit date: 12/13/1991   Years since quitting: 29.9  Smokeless Tobacco Never    Labs: Review Flowsheet        Latest Ref Rng & Units 06/01/2017 10/01/2017 04/30/2018 08/15/2018  Labs for ITP Cardiac and Pulmonary Rehab  Cholestrol 100 - 199 mg/dL   151   147    LDL (calc) 0 - 99 mg/dL   67   54    HDL-C >39 mg/dL   54   54    Trlycerides 0 - 149 mg/dL   152   194    Hemoglobin A1c 4.0 - 5.6 % 7.1   7.8   7.1   7.1    PH, Arterial 7.350 - 7.450      PCO2 arterial 32.0 - 48.0 mmHg      Bicarbonate 20.0 - 28.0 mmol/L      TCO2 22 - 32 mmol/L      O2 Saturation %         07/18/2021  Labs for ITP Cardiac and Pulmonary Rehab  Cholestrol   LDL (calc)   HDL-C   Trlycerides   Hemoglobin A1c   PH, Arterial 7.413    PCO2 arterial 45.3    Bicarbonate 30.3     30.4     28.9    TCO2 32     32     30    O2 Saturation 73.0     75.0     94.0        Multiple values from one day are sorted in reverse-chronological order        Capillary Blood Glucose: Lab Results  Component Value Date   GLUCAP 131 (H) 07/18/2021   GLUCAP 164 (H) 07/18/2021   GLUCAP 131 (H) 03/10/2015   GLUCAP 213 (H) 04/06/2014   GLUCAP 228 (H)  04/06/2014     Exercise Target Goals: Exercise Program Goal: Individual  exercise prescription set using results from initial 6 min walk test and THRR while considering  patient's activity barriers and safety.   Exercise Prescription Goal: Initial exercise prescription builds to 30-45 minutes a day of aerobic activity, 2-3 days per week.  Home exercise guidelines will be given to patient during program as part of exercise prescription that the participant will acknowledge.  Activity Barriers & Risk Stratification:  Activity Barriers & Cardiac Risk Stratification - 11/21/21 1111       Activity Barriers & Cardiac Risk Stratification   Activity Barriers Assistive Device;History of Falls;Shortness of Breath;Back Problems;Neck/Spine Problems;Other (comment);Arthritis    Comments Osteoarthritis, bone spurs in spine, heniated discs in neck and lower back. Right, great toe diabetic ulcer x18 months.    Cardiac Risk Stratification Moderate             6 Minute Walk:  6 Minute Walk     Row Name 11/21/21 1141         6 Minute Walk   Phase Initial     Distance 1140 feet     Walk Time 6 minutes     # of Rest Breaks 0     MPH 2.16     METS 1.74     RPE 14     Perceived Dyspnea  2.5     VO2 Peak 6.11     Symptoms Yes (comment)     Comments Participant c/o bilateral thigh fatigue and mild shortness of breath, RPD=2.5.     Resting HR 60 bpm     Resting BP 110/70     Resting Oxygen Saturation  95 %     Exercise Oxygen Saturation  during 6 min walk 96 %     Max Ex. HR 93 bpm     Max Ex. BP 120/52     2 Minute Post BP 100/58              Oxygen Initial Assessment:   Oxygen Re-Evaluation:   Oxygen Discharge (Final Oxygen Re-Evaluation):   Initial Exercise Prescription:  Initial Exercise Prescription - 11/21/21 1200       Date of Initial Exercise RX and Referring Provider   Date 11/21/21    Referring Provider Werner Lean, MD    Expected Discharge Date  01/19/22      Arm Ergometer   Level 2    Watts 35    Minutes 15    METs 2.32      T5 Nustep   Level 2    SPM 85    Minutes 15    METs 2      Prescription Details   Frequency (times per week) 3    Duration Progress to 30 minutes of continuous aerobic without signs/symptoms of physical distress      Intensity   THRR 40-80% of Max Heartrate 66-123    Ratings of Perceived Exertion 11-13    Perceived Dyspnea 0-4      Progression   Progression Continue to progress workloads to maintain intensity without signs/symptoms of physical distress.      Resistance Training   Training Prescription Yes    Weight 3 lbs    Reps 10-15             Perform Capillary Blood Glucose checks as needed.  Exercise Prescription Changes:   Exercise Comments:   Exercise Goals and Review:   Exercise Goals     Row Name 11/21/21 1115  Exercise Goals   Increase Physical Activity Yes       Intervention Provide advice, education, support and counseling about physical activity/exercise needs.;Develop an individualized exercise prescription for aerobic and resistive training based on initial evaluation findings, risk stratification, comorbidities and participant's personal goals.       Expected Outcomes Short Term: Attend rehab on a regular basis to increase amount of physical activity.;Long Term: Exercising regularly at least 3-5 days a week.;Long Term: Add in home exercise to make exercise part of routine and to increase amount of physical activity.       Increase Strength and Stamina Yes       Intervention Provide advice, education, support and counseling about physical activity/exercise needs.;Develop an individualized exercise prescription for aerobic and resistive training based on initial evaluation findings, risk stratification, comorbidities and participant's personal goals.       Expected Outcomes Short Term: Increase workloads from initial exercise prescription for  resistance, speed, and METs.;Short Term: Perform resistance training exercises routinely during rehab and add in resistance training at home;Long Term: Improve cardiorespiratory fitness, muscular endurance and strength as measured by increased METs and functional capacity (6MWT)       Able to understand and use rate of perceived exertion (RPE) scale Yes       Intervention Provide education and explanation on how to use RPE scale       Expected Outcomes Short Term: Able to use RPE daily in rehab to express subjective intensity level;Long Term:  Able to use RPE to guide intensity level when exercising independently       Knowledge and understanding of Target Heart Rate Range (THRR) Yes       Intervention Provide education and explanation of THRR including how the numbers were predicted and where they are located for reference       Expected Outcomes Short Term: Able to state/look up THRR;Long Term: Able to use THRR to govern intensity when exercising independently;Short Term: Able to use daily as guideline for intensity in rehab       Able to check pulse independently Yes       Intervention Provide education and demonstration on how to check pulse in carotid and radial arteries.;Review the importance of being able to check your own pulse for safety during independent exercise       Expected Outcomes Short Term: Able to explain why pulse checking is important during independent exercise;Long Term: Able to check pulse independently and accurately       Understanding of Exercise Prescription Yes       Intervention Provide education, explanation, and written materials on patient's individual exercise prescription       Expected Outcomes Short Term: Able to explain program exercise prescription;Long Term: Able to explain home exercise prescription to exercise independently                Exercise Goals Re-Evaluation :   Discharge Exercise Prescription (Final Exercise Prescription  Changes):   Nutrition:  Target Goals: Understanding of nutrition guidelines, daily intake of sodium '1500mg'$ , cholesterol '200mg'$ , calories 30% from fat and 7% or less from saturated fats, daily to have 5 or more servings of fruits and vegetables.  Biometrics:  Pre Biometrics - 11/21/21 1017       Pre Biometrics   Waist Circumference 53.5 inches    Hip Circumference 52 inches    Waist to Hip Ratio 1.03 %    Triceps Skinfold 22 mm    % Body Fat 40.9 %  Grip Strength 34 kg    Flexibility 0 in    Single Leg Stand 2.06 seconds              Nutrition Therapy Plan and Nutrition Goals:   Nutrition Assessments:  MEDIFICTS Score Key: ?70 Need to make dietary changes  40-70 Heart Healthy Diet ? 40 Therapeutic Level Cholesterol Diet    Picture Your Plate Scores: <73 Unhealthy dietary pattern with much room for improvement. 41-50 Dietary pattern unlikely to meet recommendations for good health and room for improvement. 51-60 More healthful dietary pattern, with some room for improvement.  >60 Healthy dietary pattern, although there may be some specific behaviors that could be improved.    Nutrition Goals Re-Evaluation:   Nutrition Goals Re-Evaluation:   Nutrition Goals Discharge (Final Nutrition Goals Re-Evaluation):   Psychosocial: Target Goals: Acknowledge presence or absence of significant depression and/or stress, maximize coping skills, provide positive support system. Participant is able to verbalize types and ability to use techniques and skills needed for reducing stress and depression.  Initial Review & Psychosocial Screening:  Initial Psych Review & Screening - 11/21/21 1338       Initial Review   Current issues with Current Stress Concerns    Source of Stress Concerns Family;Unable to participate in former interests or hobbies;Unable to perform yard/household activities    Comments Nael's daughter in law is critically ill at [facility].      Family  Dynamics   Good Support System? Yes   Alfonse has his wife and two sons for support     Barriers   Psychosocial barriers to participate in program The patient should benefit from training in stress management and relaxation.      Screening Interventions   Interventions To provide support and resources with identified psychosocial needs;Encouraged to exercise;Provide feedback about the scores to participant    Expected Outcomes Long Term Goal: Stressors or current issues are controlled or eliminated.;Short Term goal: Identification and review with participant of any Quality of Life or Depression concerns found by scoring the questionnaire.             Quality of Life Scores:  Quality of Life - 11/21/21 1214       Quality of Life   Select Quality of Life      Quality of Life Scores   Health/Function Pre 19.23 %    Socioeconomic Pre 21 %    Psych/Spiritual Pre 20.64 %    Family Pre 21.4 %    GLOBAL Pre 20.21 %            Scores of 19 and below usually indicate a poorer quality of life in these areas.  A difference of  2-3 points is a clinically meaningful difference.  A difference of 2-3 points in the total score of the Quality of Life Index has been associated with significant improvement in overall quality of life, self-image, physical symptoms, and general health in studies assessing change in quality of life.  PHQ-9: Review Flowsheet        11/21/2021 11/10/2021 08/15/2018 04/30/2018 10/01/2017  Depression screen PHQ 2/9  Decreased Interest 0 2 0 0 0  Down, Depressed, Hopeless 0 1 0 0 0  PHQ - 2 Score 0 3 0 0 0  Altered sleeping  2     Tired, decreased energy  3     Change in appetite  1     Feeling bad or failure about yourself   0  Trouble concentrating  0     Moving slowly or fidgety/restless  1     Suicidal thoughts  0     PHQ-9 Score  10            Interpretation of Total Score  Total Score Depression Severity:  1-4 = Minimal depression, 5-9 = Mild  depression, 10-14 = Moderate depression, 15-19 = Moderately severe depression, 20-27 = Severe depression   Psychosocial Evaluation and Intervention:   Psychosocial Re-Evaluation:   Psychosocial Discharge (Final Psychosocial Re-Evaluation):   Vocational Rehabilitation: Provide vocational rehab assistance to qualifying candidates.   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            Education: Education Goals: Education classes will be provided on a weekly basis, covering required topics. Participant will state understanding/return demonstration of topics presented.  Learning Barriers/Preferences:  Learning Barriers/Preferences - 11/21/21 1341       Learning Barriers/Preferences   Learning Barriers Sight;Hearing   Wears glasses and is hard of hearing.   Learning Preferences Audio             Education Topics: Count Your Pulse:  -Group instruction provided by verbal instruction, demonstration, patient participation and written materials to support subject.  Instructors address importance of being able to find your pulse and how to count your pulse when at home without a heart monitor.  Patients get hands on experience counting their pulse with staff help and individually.   Heart Attack, Angina, and Risk Factor Modification:  -Group instruction provided by verbal instruction, video, and written materials to support subject.  Instructors address signs and symptoms of angina and heart attacks.    Also discuss risk factors for heart disease and how to make changes to improve heart health risk factors.   Functional Fitness:  -Group instruction provided by verbal instruction, demonstration, patient participation, and  written materials to support subject.  Instructors address safety measures for doing things around the house.  Discuss how to get up and down off the floor, how to pick things up properly, how to safely get out of a chair without assistance, and balance training.   Meditation and Mindfulness:  -Group instruction provided by verbal instruction, patient participation, and written materials to support subject.  Instructor addresses importance of mindfulness and meditation practice to help reduce stress and improve awareness.  Instructor also leads participants through a meditation exercise.    Stretching for Flexibility and Mobility:  -Group instruction provided by verbal instruction, patient participation, and written materials to support subject.  Instructors lead participants through series of stretches that are designed to increase flexibility thus improving mobility.  These stretches are additional exercise for major muscle groups that are typically performed during regular warm up and cool down.   Hands Only CPR:  -Group verbal, video, and participation provides a basic overview of AHA guidelines for community CPR. Role-play of emergencies allow participants the opportunity to practice calling for help and chest compression technique with discussion of AED use.   Hypertension: -Group verbal and written instruction that provides a basic overview of hypertension including the most recent diagnostic guidelines, risk factor reduction with self-care instructions and medication management.    Nutrition I class: Heart Healthy Eating:  -  Group instruction provided by PowerPoint slides, verbal discussion, and written materials to support subject matter. The instructor gives an explanation and review of the Therapeutic Lifestyle Changes diet recommendations, which includes a discussion on lipid goals, dietary fat, sodium, fiber, plant stanol/sterol esters, sugar, and the components of a well-balanced,  healthy diet.   Nutrition II class: Lifestyle Skills:  -Group instruction provided by PowerPoint slides, verbal discussion, and written materials to support subject matter. The instructor gives an explanation and review of label reading, grocery shopping for heart health, heart healthy recipe modifications, and ways to make healthier choices when eating out.   Diabetes Question & Answer:  -Group instruction provided by PowerPoint slides, verbal discussion, and written materials to support subject matter. The instructor gives an explanation and review of diabetes co-morbidities, pre- and post-prandial blood glucose goals, pre-exercise blood glucose goals, signs, symptoms, and treatment of hypoglycemia and hyperglycemia, and foot care basics.   Diabetes Blitz:  -Group instruction provided by PowerPoint slides, verbal discussion, and written materials to support subject matter. The instructor gives an explanation and review of the physiology behind type 1 and type 2 diabetes, diabetes medications and rational behind using different medications, pre- and post-prandial blood glucose recommendations and Hemoglobin A1c goals, diabetes diet, and exercise including blood glucose guidelines for exercising safely.    Portion Distortion:  -Group instruction provided by PowerPoint slides, verbal discussion, written materials, and food models to support subject matter. The instructor gives an explanation of serving size versus portion size, changes in portions sizes over the last 20 years, and what consists of a serving from each food group.   Stress Management:  -Group instruction provided by verbal instruction, video, and written materials to support subject matter.  Instructors review role of stress in heart disease and how to cope with stress positively.     Exercising on Your Own:  -Group instruction provided by verbal instruction, power point, and written materials to support subject.  Instructors  discuss benefits of exercise, components of exercise, frequency and intensity of exercise, and end points for exercise.  Also discuss use of nitroglycerin and activating EMS.  Review options of places to exercise outside of rehab.  Review guidelines for sex with heart disease.   Cardiac Drugs I:  -Group instruction provided by verbal instruction and written materials to support subject.  Instructor reviews cardiac drug classes: antiplatelets, anticoagulants, beta blockers, and statins.  Instructor discusses reasons, side effects, and lifestyle considerations for each drug class.   Cardiac Drugs II:  -Group instruction provided by verbal instruction and written materials to support subject.  Instructor reviews cardiac drug classes: angiotensin converting enzyme inhibitors (ACE-I), angiotensin II receptor blockers (ARBs), nitrates, and calcium channel blockers.  Instructor discusses reasons, side effects, and lifestyle considerations for each drug class.   Anatomy and Physiology of the Circulatory System:  Group verbal and written instruction and models provide basic cardiac anatomy and physiology, with the coronary electrical and arterial systems. Review of: AMI, Angina, Valve disease, Heart Failure, Peripheral Artery Disease, Cardiac Arrhythmia, Pacemakers, and the ICD.   Other Education:  -Group or individual verbal, written, or video instructions that support the educational goals of the cardiac rehab program.   Holiday Eating Survival Tips:  -Group instruction provided by PowerPoint slides, verbal discussion, and written materials to support subject matter. The instructor gives patients tips, tricks, and techniques to help them not only survive but enjoy the holidays despite the onslaught of food that accompanies the holidays.   Knowledge Questionnaire  Score:  Knowledge Questionnaire Score - 11/21/21 1215       Knowledge Questionnaire Score   Pre Score 21/24             Core  Components/Risk Factors/Patient Goals at Admission:  Personal Goals and Risk Factors at Admission - 11/21/21 1106       Core Components/Risk Factors/Patient Goals on Admission    Weight Management Yes;Obesity;Weight Loss    Intervention Weight Management/Obesity: Establish reasonable short term and long term weight goals.;Obesity: Provide education and appropriate resources to help participant work on and attain dietary goals.    Admit Weight 305 lb 8.9 oz (138.6 kg)    Expected Outcomes Short Term: Continue to assess and modify interventions until short term weight is achieved;Long Term: Adherence to nutrition and physical activity/exercise program aimed toward attainment of established weight goal;Weight Loss: Understanding of general recommendations for a balanced deficit meal plan, which promotes 1-2 lb weight loss per week and includes a negative energy balance of [phone] kcal/d    Improve shortness of breath with ADL's Yes    Intervention Provide education, individualized exercise plan and daily activity instruction to help decrease symptoms of SOB with activities of daily living.    Expected Outcomes Short Term: Improve cardiorespiratory fitness to achieve a reduction of symptoms when performing ADLs;Long Term: Be able to perform more ADLs without symptoms or delay the onset of symptoms    Diabetes Yes    Intervention Provide education about signs/symptoms and action to take for hypo/hyperglycemia.;Provide education about proper nutrition, including hydration, and aerobic/resistive exercise prescription along with prescribed medications to achieve blood glucose in normal ranges: Fasting glucose 65-99 mg/dL    Expected Outcomes Short Term: Participant verbalizes understanding of the signs/symptoms and immediate care of hyper/hypoglycemia, proper foot care and importance of medication, aerobic/resistive exercise and nutrition plan for blood glucose control.;Long Term: Attainment of HbA1C < 7%.     Hypertension Yes    Intervention Provide education on lifestyle modifcations including regular physical activity/exercise, weight management, moderate sodium restriction and increased consumption of fresh fruit, vegetables, and low fat dairy, alcohol moderation, and smoking cessation.;Monitor prescription use compliance.    Expected Outcomes Short Term: Continued assessment and intervention until BP is < 140/23m HG in hypertensive participants. < 130/843mHG in hypertensive participants with diabetes, heart failure or chronic kidney disease.;Long Term: Maintenance of blood pressure at goal levels.    Lipids Yes    Intervention Provide education and support for participant on nutrition & aerobic/resistive exercise along with prescribed medications to achieve LDL '70mg'$ , HDL >'40mg'$ .    Expected Outcomes Short Term: Participant states understanding of desired cholesterol values and is compliant with medications prescribed. Participant is following exercise prescription and nutrition guidelines.;Long Term: Cholesterol controlled with medications as prescribed, with individualized exercise RX and with personalized nutrition plan. Value goals: LDL < '70mg'$ , HDL > 40 mg.    Stress Yes    Intervention Offer individual and/or small group education and counseling on adjustment to heart disease, stress management and health-related lifestyle change. Teach and support self-help strategies.;Refer participants experiencing significant psychosocial distress to appropriate mental health specialists for further evaluation and treatment. When possible, include family members and significant others in education/counseling sessions.    Expected Outcomes Short Term: Participant demonstrates changes in health-related behavior, relaxation and other stress management skills, ability to obtain effective social support, and compliance with psychotropic medications if prescribed.;Long Term: Emotional wellbeing is indicated by absence  of clinically significant psychosocial distress or social isolation.  Core Components/Risk Factors/Patient Goals Review:    Core Components/Risk Factors/Patient Goals at Discharge (Final Review):    ITP Comments:  ITP Comments     Row Name 11/21/21 1017           ITP Comments Medical Director- Dr. Fransico Him, MD. Introduction to [facility] program provided at cardiac rehab orientation. Reviewed initial resource packet with the patient. Patient will review packet at home. Question's answered.                Comments: Participant attended orientation for the cardiac rehabilitation program on 11/21/2021 to perform intake and exercise walk test. Patient introduced to the [facility] and orientation packet was reviewed. Completed 6-minute walk test, measurements, initial ITP, and exercise prescription. Vital signs stable. Telemetry-normal sinus rhythm, asymptomatic.  Service time was from 1017 to 1155.

## 2021-11-21 NOTE — Progress Notes (Signed)
Cardiac Rehab Medication Review by a Nurse  Does the patient  feel that his/her medications are working for him/her?  yes  Has the patient been experiencing any side effects to the medications prescribed?  no  Does the patient measure his/her own blood pressure or blood glucose at home?  yes   Does the patient have any problems obtaining medications due to transportation or finances?   no  Understanding of regimen: excellent Understanding of indications: excellent Potential of compliance: excellent    Nurse comments: [name] is taking his medications as prescribed and has a excellent understanding of what his medications are for. [name] checks his CBG's and blood pressures daily.    Christa See Nga Rabon RN 11/21/2021 1:32 PM

## 2021-11-27 ENCOUNTER — Encounter (HOSPITAL_COMMUNITY)
Admission: RE | Admit: 2021-11-27 | Discharge: 2021-11-27 | Disposition: A | Discharge status: Home / Self Care | Payer: Medicare Other | Source: Ambulatory Visit | Attending: Internal Medicine | Admitting: Internal Medicine

## 2021-11-27 DIAGNOSIS — Z955 Presence of coronary angioplasty implant and graft: Secondary | ICD-10-CM | POA: Diagnosis not present

## 2021-11-27 LAB — GLUCOSE, CAPILLARY
Glucose-Capillary: 228 mg/dL — ABNORMAL HIGH (ref 70–99)
Glucose-Capillary: 156 mg/dL — ABNORMAL HIGH (ref 70–99)

## 2021-11-27 NOTE — Progress Notes (Signed)
QUALITY OF LIFE SCORE REVIEW  Pt completed Quality of Life survey as a participant in Cardiac Rehab.  Scores 19.0.0 or below are considered low.  Pt score very low in several areas Overall 20.21, Health and Function 19.23, socioeconomic 21, physiological and spiritual 20.64, family 21.4. Patient quality of life slightly altered by physical constraints which limits ability to perform as prior to recent cardiac illness.[name] daughter in law just passed away after a sudden critical illness. Amareon reports that he is short of breath he has an appointment to follow up with his pulmonologist at the end of June.Kace reports being sad but denies being depressed currently.  Offered emotional support and reassurance.  Will continue to monitor and intervene as necessary.  Gladstone Lighter, RN,BSN 11/29/2021 8:44 AM

## 2021-11-27 NOTE — Progress Notes (Signed)
Daily Session Note  Patient Details  Name: [name] MRN: [id number] Date of Birth: 1955-07-30 Referring Provider:   Flowsheet Row INTENSIVE CARDIAC REHAB ORIENT from 11/21/2021 in [place]  Referring Provider Werner Lean, MD       Encounter Date: 11/27/2021  Check In:  Session Check In - 11/27/21 1511       Check-In   Supervising physician immediately available to respond to emergencies [facility] immediately available    Physician(s) Dr. Cyndia Skeeters    Location [facility]    Staff Present [name], RN, Deland Pretty, MS, ACSM CEP, Exercise Physiologist;David Lilyan Punt, MS, ACSM-CEP, CCRP, Exercise Physiologist    Virtual Visit No    Medication changes reported     No    Fall or balance concerns reported    No    Tobacco Cessation No Change    Warm-up and Cool-down Performed as group-led instruction    Resistance Training Performed Yes    VAD Patient? No    PAD/SET Patient? No      Pain Assessment   Currently in Pain? No/denies    Pain Score 0-No pain    Multiple Pain Sites No             Capillary Blood Glucose: Results for orders placed or performed during the hospital encounter of 11/27/21 (from the past 24 hour(s))  Glucose, capillary     Status: Abnormal   Collection Time: 11/27/21  2:54 PM  Result Value Ref Range   Glucose-Capillary 228 (H) 70 - 99 mg/dL  Glucose, capillary     Status: Abnormal   Collection Time: 11/27/21  4:08 PM  Result Value Ref Range   Glucose-Capillary 156 (H) 70 - 99 mg/dL     Exercise Prescription Changes - 11/27/21 1459       Response to Exercise   Blood Pressure (Admit) 98/64    Blood Pressure (Exercise) 118/72    Blood Pressure (Exit) 103/69    Heart Rate (Admit) 73 bpm    Heart Rate (Exercise) 81 bpm    Heart Rate (Exit) 73 bpm    Rating of Perceived Exertion (Exercise) 12.5    Perceived Dyspnea (Exercise) 0    Symptoms None    Comments  Off to a good start with exercise.    Duration Continue with 30 min of aerobic exercise without signs/symptoms of physical distress.    Intensity THRR unchanged      Progression   Progression Continue to progress workloads to maintain intensity without signs/symptoms of physical distress.    Average METs 1.6      Resistance Training   Training Prescription Yes    Weight 3 lbs    Reps 10-15    Time 10 Minutes      Interval Training   Interval Training No      Arm Ergometer   Level 2    Watts 8    Minutes 15    METs 1.5      T5 Nustep   Level 2    SPM 64    Minutes 15    METs 1.8             Social History   Tobacco Use  Smoking Status Former   Packs/day: 4.00   Years: 20.00   Total pack years: 80.00   Types: Cigarettes   Start date: 07/19/1973   Quit date: 12/13/1991   Years since quitting: 29.9  Smokeless  Tobacco Never    Goals Met:  Exercise tolerated well No report of concerns or symptoms today Strength training completed today  Goals Unmet:  Not Applicable  Comments: Pt started cardiac rehab today.  Pt tolerated light exercise without difficulty. VSS, telemetry-Sinus Rhythm, asymptomatic.  Medication list reconciled. Pt denies barriers to medicaiton compliance.  PSYCHOSOCIAL ASSESSMENT:  PHQ-0. Pt exhibits positive coping skills, hopeful outlook with supportive family. Malek's daughter in law passed away at [place]  this past weekend after a sudden illness. Emotional support offered.   Pt oriented to exercise equipment and routine.    Understanding verbalized. Reviewed quality of life questionnaire.Harrell Gave RN BSN    Dr. Fransico Him is Medical Director for Cardiac Rehab at [facility].

## 2021-11-28 ENCOUNTER — Other Ambulatory Visit (HOSPITAL_COMMUNITY): Payer: Medicare Other

## 2021-11-29 ENCOUNTER — Encounter (HOSPITAL_COMMUNITY)
Admission: RE | Admit: 2021-11-29 | Discharge: 2021-11-29 | Disposition: A | Discharge status: Home / Self Care | Payer: Medicare Other | Source: Ambulatory Visit | Attending: Internal Medicine | Admitting: Internal Medicine

## 2021-11-29 DIAGNOSIS — Z955 Presence of coronary angioplasty implant and graft: Secondary | ICD-10-CM | POA: Diagnosis not present

## 2021-11-29 LAB — GLUCOSE, CAPILLARY
Glucose-Capillary: 209 mg/dL — ABNORMAL HIGH (ref 70–99)
Glucose-Capillary: 189 mg/dL — ABNORMAL HIGH (ref 70–99)

## 2021-11-29 NOTE — Progress Notes (Signed)
Vital signs from today are as follows. Entry BP 98/78 heart rate 67 resting                                                                      BP 114/80 heart rate 78 nustep                                                                      BP 98/58   heart rate 80 arm ergometer                                                                      BP 98/66   heart rate 73 exit BP Patient asymptomatic. Will forward today's vital signs to Dr Oralia Rud office for review. [name] reports that he is asymptomatic. Medications reviewed. Taking as prescribed. Will continue to monitor the patient throughout  the program. [name] Gave RN BSN

## 2021-12-01 ENCOUNTER — Encounter (HOSPITAL_COMMUNITY)
Admission: RE | Admit: 2021-12-01 | Discharge: 2021-12-01 | Disposition: A | Discharge status: Home / Self Care | Payer: Medicare Other | Source: Ambulatory Visit | Attending: Internal Medicine | Admitting: Internal Medicine

## 2021-12-01 ENCOUNTER — Ambulatory Visit (INDEPENDENT_AMBULATORY_CARE_PROVIDER_SITE_OTHER): Payer: Medicare Other | Admitting: Podiatry

## 2021-12-01 DIAGNOSIS — B351 Tinea unguium: Secondary | ICD-10-CM

## 2021-12-01 DIAGNOSIS — Z955 Presence of coronary angioplasty implant and graft: Secondary | ICD-10-CM

## 2021-12-01 DIAGNOSIS — Z7901 Long term (current) use of anticoagulants: Secondary | ICD-10-CM | POA: Diagnosis not present

## 2021-12-01 DIAGNOSIS — E1149 Type 2 diabetes mellitus with other diabetic neurological complication: Secondary | ICD-10-CM

## 2021-12-01 LAB — GLUCOSE, CAPILLARY
Glucose-Capillary: 182 mg/dL — ABNORMAL HIGH (ref 70–99)
Glucose-Capillary: 131 mg/dL — ABNORMAL HIGH (ref 70–99)

## 2021-12-03 NOTE — Progress Notes (Signed)
Subjective: 66 year old male presents the office today for follow evaluation of wound on his right big toe.  Since last appointment referred to the wound care center.  The wound has healed he presents today for follow-up and also different thick elongated toenails he is not able to trim himself.  No new ulcerations are noted.  He has no other concerns today.  Objective: AAO x3, NAD DP/PT pulses palpable bilaterally, CRT less than 3 seconds Chronic edema present bilateral lower extremities. Minimal hyperkeratotic tissue in the right hallux with some old dried blood underneath this.  There is no ulceration noted today.  No edema, erythema or signs of infection. Nails are hypertrophic, dystrophic, brittle, discolored, elongated 10. No surrounding redness or drainage. Tenderness nails 1-5 bilaterally.  No pain with calf compression, swelling, warmth, erythema     Assessment: 66 year old male with ulceration right, cellulitis  Plan: -All treatment options discussed with the patient including all alternatives, risks, complications.  -Wound is healed the right side.  Recommend moisturizer and offloading daily.  Discussed that if inspection and hopefully prevent reoccurrence. -Sharply debrided the nails x10 without any complications or bleeding  Trula Slade DPM

## 2021-12-04 ENCOUNTER — Encounter (HOSPITAL_COMMUNITY)
Admission: RE | Admit: 2021-12-04 | Discharge: 2021-12-04 | Disposition: A | Discharge status: Home / Self Care | Payer: Medicare Other | Source: Ambulatory Visit | Attending: Internal Medicine | Admitting: Internal Medicine

## 2021-12-04 DIAGNOSIS — Z955 Presence of coronary angioplasty implant and graft: Secondary | ICD-10-CM | POA: Diagnosis not present

## 2021-12-05 ENCOUNTER — Encounter: Payer: Self-pay | Admitting: Internal Medicine

## 2021-12-05 ENCOUNTER — Ambulatory Visit (INDEPENDENT_AMBULATORY_CARE_PROVIDER_SITE_OTHER): Payer: Medicare Other | Admitting: Internal Medicine

## 2021-12-05 VITALS — BP 130/64 | HR 73 | Temp 98.0°F | Ht 70.5 in | Wt 306.6 lb

## 2021-12-05 DIAGNOSIS — R0609 Other forms of dyspnea: Secondary | ICD-10-CM

## 2021-12-05 DIAGNOSIS — Z8249 Family history of ischemic heart disease and other diseases of the circulatory system: Secondary | ICD-10-CM | POA: Diagnosis not present

## 2021-12-05 DIAGNOSIS — Z86711 Personal history of pulmonary embolism: Secondary | ICD-10-CM | POA: Diagnosis not present

## 2021-12-05 DIAGNOSIS — Z6841 Body Mass Index (BMI) 40.0 and over, adult: Secondary | ICD-10-CM

## 2021-12-05 DIAGNOSIS — J438 Other emphysema: Secondary | ICD-10-CM

## 2021-12-05 DIAGNOSIS — I2510 Atherosclerotic heart disease of native coronary artery without angina pectoris: Secondary | ICD-10-CM | POA: Diagnosis not present

## 2021-12-05 DIAGNOSIS — G4733 Obstructive sleep apnea (adult) (pediatric): Secondary | ICD-10-CM

## 2021-12-05 LAB — D-DIMER, QUANTITATIVE: D-Dimer, Quant: 0.2 mcg/mL FEU (ref <0.50)

## 2021-12-05 NOTE — Progress Notes (Signed)
Subjective:    Patient ID: [name], male    DOB: 01/12/1956, 66 y.o.   MRN: [id number]  PCP Leandrew Koyanagi, MD > Dr [name]  HPI   IOV 01/20/2016  Chief Complaint  Patient presents with   Pulmonary Consult    Pt referred by Dr. [name] for pleuritic chest pain - left side. Pt states the pain is better when sleeping on right side. Pt denies significant cough. Pt c/o intermittent DOE.    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 He is accompanied by his wife. He has obesity and metabolic syndrome. He says that starting early June 2017 started noticing a sensation in his left lower chest left upper quadrant anterior axillary area. The pain was constant but occasionally would have spontaneous increase in intensity. The best describes this as a pain but although he says it might not be a pain but moreover sensation. It is definitely relieved when he lies down on his right side and occasionally made worse if he were to take multiple deep breaths but no other clear cut aggravating or relieving factors. Then in mid July 2017 he saw primary care physician in 01/03/2016 had Rib x-ray that was normal. Does report of atelectasis left base and therefore referred. While waiting for this appointment on 01/16/2016 he said he had several more wine drinks than usual and passed out and probably bump the left lower quadrant area of the chest against the desk but he is not sure and then this afternoon and new bruising appeared in the same area as the sensation/pain. Is no change in dyspnea or orthopnea. He does not have. His chronic sleep apnea which uses CPAP at managed by primary care physician. He is worried about pulmonary embolism because his mom died of DVT and pulmonary embolism. There is no hemoptysis  Review of chest x-ray from 2015 and prior chest films Personally visualized shows chronic elevation of the left diaphragm but he is not aware of this  problem    ICD-9-CM ICD-10-CM   1. Chest pain, pleuritic 786.52 R07.81   2. Superficial bruising of chest wall, left, initial encounter 922.1 S20.212A   3. 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 Range 01/20/2016 17:31  D-Dimer, Quant Latest Ref Range: <0.50 mcg/mL FEU 0.42     CT chest with or without contrast depending on d-dimer blood test Do sniff test next week  Followup  - depending on test results  SNIFF TEST    IMPRESSION: No paralysis of the left hemidiaphragm. Gaseous distention of the stomach which appears to be new since lap band placement.     Electronically Signed   By: Lorriane Shire M.D.   On: 01/24/2016 11:40   CT chest aug 2017  IMPRESSION: 1. No fracture is evident. There are degenerative changes throughout the thoracic spine. 2. Calcification of the coronary arteries diffusely. 3. No lung infiltrate or pleural effusion.     Electronically Signed   By: Ivar Drape M.D.   On: 01/26/2016 08:17  Nuclear medicine cardiac stress study May 2017 Study Highlights  Nuclear stress EF: 47%. The left ventricular ejection fraction is mildly decreased (45-54%). There was no ST segment deviation noted during stress. This is a low risk study. There is no evidence of ischemia or previous infarction   OV  02/02/2021  Subjective:  Patient ID: [name], male , DOB: 25-Feb-1956 , age 51 y.o. , MRN: [id number] , ADDRESS: [place] [id number] PCP [name], MD Patient Care Team: [name], MD as PCP - General (Family Medicine) Werner Lean, MD as PCP - Cardiology (Cardiology) Belva Crome, MD as Consulting Physician (Cardiology) Milus Banister, MD as Attending Physician (Gastroenterology) Brand Males, MD as Consulting Physician (Pulmonary Disease) Druscilla Brownie, MD as Consulting Physician  (Dermatology) Mosetta Anis, MD as Referring Physician (Allergy) Rexene Agent, MD as Attending Physician (Nephrology) Thornell Sartorius, MD as Consulting Physician (Otolaryngology) Christain Sacramento, [place] as Referring Physician (Optometry) Ulice Brilliant, MD as Referring Physician (Internal Medicine)  This Provider for this visit: Treatment Team:  Attending Provider: Brand Males, MD    02/02/2021 -   Chief Complaint  Patient presents with   Consult    Former pt last seen 01/20/16.  Pt states that he has had problems with SOB x6 months that happens with exertion.     HPI [name] 66 y.o. -personally not seen in 5 years.  Therefore there is a new consult.  He is here with his wife.  Since I last saw him he is now retired.  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  He tells me for the last 6 months or so he has had insidious onset of shortness of breath present on exertion such as walking from the car to the front desk of this office.  Relieved by rest.  Since its onset it is stable.  No associated chest pain.  There is no associated cough or wheezing.  No paroxysmal nocturnal dyspnea.  No orthopnea no weight gain.  Cardiologist has increased Lasix for pedal edema.    He had a stress test several years ago that was normal but he had low ejection fraction.  Most recently seen Dr. Ezra Sites at [place] cardiologist who is getting an echo on him.  He also has a longstanding history of sleep apnea.  He no longer has a sleep doctor but uses auto CPAP which he thinks is mostly set at 12.  There is no oxygen use at night.  He also has a wound on the plantar aspect of his right hallux for which he sees podiatry.  He has knee issues.  He has obesity, AAA, OSA on CPAP, diabetes, hypertension.  In the midst of all this he had a routine CT scan of the chest with [facility] on December 16, 2020 and this showed a small pulmonary embolism on the right side without any RV strain.  He was  started on outpatient Xarelto by his primary care physician.  He was never hospitalized for this never on oxygen for this.  He wants to discuss this as well.  He and his wife have questions on severity of pulmonary embolism.  He wanted to know the size of it on the CT scan.  The CT scan was sent for upload to PACS system but I do not visualize the image.  He is currently on full dose Xarelto.  The only scan that I can visualize is the he 2017 CT scan that looks clear upon my personal visualization.   Simple office walk 185 feet x  3 laps goal with forehead probe 02/02/2021   O2 used ra  Number laps completed 3  Comments about pace avg  Resting Pulse Ox/HR 99% and 74/min  Final Pulse Ox/HR 97% and 94/min  Desaturated </= 88% no  Desaturated <= 3% points no  Got Tachycardic >/= 90/min yes  Symptoms at end of test Mild dyspnea  Miscellaneous comments x     Results for KESHON, MARKOVITZ (MRN [id number]) as of 02/02/2021 14:37  Ref. Range 02/01/2021 15:02  Creatinine Latest Ref Range: 0.76 - 1.27 mg/dL 1.19  Results for WINNER, VALERIANO (MRN [id number]) as of 02/02/2021 14:37  Ref. Range 08/15/2018 10:01  Hemoglobin Latest Ref Range: 13.0 - 17.7 g/dL 16.5   CTA 12/16/20  Griffith Citron, MD - 12/16/2020  Formatting of this note might be different from the original.  INDICATION: Interstitial pulmonary disease, unspecified (#).   COMPARISON:  None.   TECHNIQUE:  Routine CT thoracic angiogram with contrast was performed using Contrast:  100 mL mL of Isovue 370.  Multiplanar 2D and angiographic 3D MIP images were constructed and reviewed. Radiation dose reduction was utilized (automated exposure control, mA or kV adjustment based on patient size, or iterative image reconstruction).   Contrast bolus quality:  satisfactory   FINDINGS:   The ascending thoracic aorta measures 3.6 x 3.5 cm in transverse by AP dimension. The descending thoracic aorta measures 2.6 x 2.6 cm in transverse by AP  dimension. There is atherosclerotic disease of the thoracic aorta and coronary arteries without evidence of focal intimal irregularity.   There is a central pulmonary arterial filling defect in the right lower lobe segmental pulmonary artery (series 4, image 43). No CT evidence of right heart strain. No pericardial effusion or bulky mediastinal adenopathy.   No focal consolidation. No pleural effusion or pneumothorax.   Gastric band is present. There is a cyst dorsally seen in the upper pole the right kidney. The visualized portions of the upper abdomen are otherwise unremarkable.    IMPRESSION:  1.  Segmental pulmonary thromboembolism in the right lower lobe. No evidence of right heart strain.        02/15/2021 Presents today for 2-week follow-up with PFTs. He reports getting winded with activity but recovered with rest. He has a rare cough. He underwent high-resolution CAT scan this month that showed no evidence of fibrotic interstitial lung disease. He had some bandlike scarring and volume loss to lung bases along with mild emphysema and coronary artery disease. Echocardiogram consistent with heart failure with preserved ejection fraction. He is on lasix '40mg'$  twice daily. Overnight oximetry on CPAP was completed 2 days ago and results are pending. He has been referred to hematology to discuss length of anticoagulation for hx PE. He is currently being treated for cellulitis with doxycycline. No fevers.   History of pulmonary embolus (PE) - D-dimer was normal, follow up with hematology for anticoagulation duration    OSA (obstructive sleep apnea) - Continue CPAP nightly, awaiting ONO results      03/08/2021- interim hx  Patient presents today for televisit. He has yet to try Advair because he states that he has not been that active. He still gets short winded, he attributes this to being overweight and right lower lobe pulmonary embolism which was diagnosed in July 2022. He was having  shortness of breath at the time that he believed was related to covid-19. He has an 80 pack year smoking hx. He travels a lot for work and is obese which are also contributing factors. He continues to take Xarelto. He saw hematology on September 1st, he will need to be on anticoagulation indefinitely, he will need to continue  Xarelto '20mg'$  daily for 6 months and then will reevaluate and consider decreasing to maintenance dose. He has a rare cough with some associated wheezing.     OV 07/27/2021  Subjective:  Patient ID: [name], male , DOB: 1956/01/31 , age 63 y.o. , MRN: [id number] , ADDRESS: [place] [id number] PCP [name], MD Patient Care Team: [name], MD as PCP - General (Family Medicine) Werner Lean, MD as PCP - Cardiology (Cardiology) Belva Crome, MD as Consulting Physician (Cardiology) Milus Banister, MD as Attending Physician (Gastroenterology) Brand Males, MD as Consulting Physician (Pulmonary Disease) Druscilla Brownie, MD as Consulting Physician (Dermatology) Mosetta Anis, MD as Referring Physician (Allergy) Rexene Agent, MD as Attending Physician (Nephrology) Thornell Sartorius, MD as Consulting Physician (Otolaryngology) Christain Sacramento, [place] as Referring Physician (Optometry) Ulice Brilliant, MD as Referring Physician (Internal Medicine)  This Provider for this visit: Treatment Team:  Attending Provider: Brand Males, MD    07/27/2021 -   Chief Complaint  Patient presents with   Follow-up    Pt states he still has complaints of SOB with exertion.   Multifactorial dyspnea.  Presents for follow-up.  Presents with his wife.  HPI [name] 68 y.o. -returns for follow-up.  Last seen in the summer 2022.  In between he see nurse practitioner who reviewed his results his high-resolution CT chest back then did not show any ILD.  There was some paraseptal emphysema.  She gave him Advair but  this did not help him.  He says subsequently he looked up paraseptal emphysema and then panicked thinking his life extensively was very limited.  He is continue to deal with dyspnea on exertion.  He did have an echocardiogram in the summer that did not show any RV strain.  He had diastolic dysfunction.  But on 07/18/2021 he underwent right heart catheterization to right radial approach.  He had a 90% proximal RCA stenosis with a heavily calcified lesion.  He also had a mid RCA 75% stenosis lesion.  His EF is 55 to 65%.  He is status post 2 stents.  Despite this he does not feel any better.  He continues to have shortness of breath with exertion.  He states he is compliant with his Eliquis.  He is worried that the blood clot is the reason for the shortness of breath because of all the shortness of breath started after that.  He continues to have a high BMI.  He did see oncology on 06/28/2021 and at that time expressed that he was having dyspnea 400 feet relieved by rest.  He sometimes uses a wheelchair.  He did see Dr. Lorenso Courier in January and hematology.  Indefinite anticoagulation has been recommended.  This is because of unprovoked right lower lobe segmental pulmonary embolism.  We did discuss potential Spiriva empiric for the mild paraseptal emphysema that is from his remote smoking.  He is agreeable to give this a try.  Did give him samples.  VQ SCAN No lobar, segmental or subsegmental perfusion defect is visible bilaterally.   IMPRESSION: No perfusion defect is seen in either lung.     Electronically Signed   By: Telford Nab M.D.   On: 08/10/2021 02:08  OV 12/05/2021  Subjective:  Patient ID: [name], male , DOB: September 18, 1955 , age 67 y.o. , MRN: [id number] , ADDRESS: [place] [id number] PCP [name], MD Patient Care Team:  [name], MD as PCP - General (Family Medicine) Werner Lean, MD as PCP - Cardiology (Cardiology) Belva Crome,  MD as Consulting Physician (Cardiology) Milus Banister, MD as Attending Physician (Gastroenterology) Brand Males, MD as Consulting Physician (Pulmonary Disease) Druscilla Brownie, MD as Consulting Physician (Dermatology) Mosetta Anis, MD as Referring Physician (Allergy) Rexene Agent, MD as Attending Physician (Nephrology) Thornell Sartorius, MD as Consulting Physician (Otolaryngology) Christain Sacramento, [place] as Referring Physician (Optometry) Ulice Brilliant, MD as Referring Physician (Internal Medicine)  This Provider for this visit: Treatment Team:  Attending Provider: Brand Males, MD    12/05/2021 -   Chief Complaint  Patient presents with   Follow-up    Pt states he feels his breathing has gotten better since last visit. Denies any complaints of cough.   Follow-up multifactorial dyspnea  -= History of pulmonary embolism July 2022 admitted for small right-sided pulmonary embolism at [facility].  On prophylactic Xarelto 10 mg/day as of June 2023.Marland Kitchen  Strong family history.  Followed by Dr. Lorenso Courier.  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 -presents with his wife.  He tells me he is much improved.  He attributes his difference to his primary care switching him to Trelegy.  He tried Spiriva that I gave him last time but he only took it for a short while and then did not like it and it was ineffective.  He does not remember the side effect but he does not want to take it anymore.  He is also going through rehab cardiac.  He is one third of the way through.  But he feels the improvement is because of Trelegy.  He is somewhat better he still has residual shortness of breath.  He wanted know if he can stop his Xarelto.  He tells me he is bruising easily on his forearms.  The dosing is prophylactic dosing.  In July 2020 he suffered a blood clot.  In August 2022 he did have normal D-dimers.  He did have a normal  VQ scan but his hematologist Dr. Lorenso Courier given his strong family history is recommended lifelong low-dose anticoagulation.  I defer this question to Dr. Lorenso Courier but did inform the patient that we could check a D-dimer today to predict future risk of blood clot.  Did indicate to him that aspirin while protective is no has protective as Xarelto.  However this is best addressed through hematology.  I did recommend there is benefit for him taking his Xarelto.  He sees Dr. Lorenso Courier in October 2023 and will take it up with Dr. Lorenso Courier.    CT Chest data  No results found.    PFT     Latest Ref Rng & Units 02/15/2021   11:37 AM  PFT Results  FVC-Pre L 2.98   FVC-Predicted Pre % 60   FVC-Post L 3.33   FVC-Predicted Post % 67   Pre FEV1/FVC % % 84   Post FEV1/FCV % % 84   FEV1-Pre L 2.49   FEV1-Predicted Pre % 67   FEV1-Post L 2.80   DLCO uncorrected ml/min/mmHg 30.12   DLCO UNC% % 105   DLVA Predicted % 143   TLC L 5.84   TLC % Predicted % 78   RV % Predicted % 109        has a past medical history of Abdominal aortic aneurysm (AAA), 30-34 mm  diameter ([place]) (06/01/2017), Arthritis, Cervical disc disease (12/26/2011), Coronary artery disease involving native coronary artery of native heart without angina pectoris (12/01/2016), Diabetes mellitus, Difficult intubation (04/05/2014), Dyslipidemia, Family history of anesthesia complication, Fatty liver (06/01/2017), GERD (gastroesophageal reflux disease), H/O hiatal hernia, Hearing loss-aides (03/26/2012), Hyperlipidemia, Hypertension, Morbid obesity ([place]), OSA (obstructive sleep apnea), Rotator cuff tear, Seasonal allergies, Shortness of breath, and Sleep apnea.   reports that he quit smoking about 30 years ago. His smoking use included cigarettes. He started smoking about 48 years ago. He has a 80.00 pack-year smoking history. He has never used smokeless tobacco.  Past Surgical History:  Procedure Laterality Date   CARDIAC CATHETERIZATION      approx 3 years ago    COLONOSCOPY WITH PROPOFOL N/A 03/10/2015   Procedure: COLONOSCOPY WITH PROPOFOL;  Surgeon: Milus Banister, MD;  Location: [facility];  Service: Endoscopy;  Laterality: N/A;   colonscopy      2012   CORONARY ATHERECTOMY N/A 07/18/2021   Procedure: CORONARY ATHERECTOMY;  Surgeon: Jettie Booze, MD;  Location: [facility];  Service: Cardiovascular;  Laterality: N/A;   CORONARY STENT INTERVENTION N/A 07/18/2021   Procedure: CORONARY STENT INTERVENTION;  Surgeon: Jettie Booze, MD;  Location: [facility];  Service: Cardiovascular;  Laterality: N/A;   ESOPHAGOGASTRODUODENOSCOPY (EGD) WITH PROPOFOL N/A 03/10/2015   Procedure: ESOPHAGOGASTRODUODENOSCOPY (EGD) WITH PROPOFOL;  Surgeon: Milus Banister, MD;  Location: [facility];  Service: Endoscopy;  Laterality: N/A;   HERNIA REPAIR     with gastric banding   LAPAROSCOPIC GASTRIC BANDING N/A 04/05/2014   Procedure: LAPAROSCOPIC GASTRIC BANDING;  Surgeon: Pedro Earls, MD;  Location: [facility];  Service: General;  Laterality: N/A;   MENISCUS REPAIR Left    left knee torn meniscus   RIGHT/LEFT HEART CATH AND CORONARY ANGIOGRAPHY N/A 07/18/2021   Procedure: RIGHT/LEFT HEART CATH AND CORONARY ANGIOGRAPHY;  Surgeon: Jettie Booze, MD;  Location: [facility];  Service: Cardiovascular;  Laterality: N/A;   VASECTOMY      Allergies  Allergen Reactions   Farxiga [Dapagliflozin] Diarrhea and Itching   Erythromycin Nausea Only   Penicillins Nausea Only    Upset stomach    Immunization History  Administered Date(s) Administered   Influenza, High Dose Seasonal PF 04/30/2018   Influenza,inj,Quad PF,6+ Mos 06/30/2016, 03/14/2017   Moderna Sars-Covid-2 Vaccination 10/14/2020   PFIZER(Purple Top)SARS-COV-2 Vaccination 09/10/2019, 10/05/2019   Pneumococcal Polysaccharide-23 10/09/2012   Tdap 02/09/2015    Family History  Problem Relation Age of Onset   Clotting disorder Mother        lung  clot   Mesothelioma Father    Hypertension Brother    Skin cancer Brother    Colon cancer Neg Hx      Current Outpatient Medications:    acetaminophen (TYLENOL) 500 MG tablet, Take 500 mg by mouth every 6 (six) hours as needed (for pain.)., Disp: , Rfl:    clopidogrel (PLAVIX) 75 MG tablet, Take 1 tablet (75 mg total) by mouth daily with breakfast., Disp: 90 tablet, Rfl: 3   diltiazem (CARDIZEM CD) 300 MG 24 hr capsule, Take 300 mg by mouth in the morning., Disp: , Rfl:    diphenhydrAMINE (BENADRYL) 25 MG tablet, Take 25 mg by mouth every 6 (six) hours as needed for itching., Disp: , Rfl:    empagliflozin (JARDIANCE) 10 MG TABS tablet, Take 1 tablet (10 mg total) by mouth daily before breakfast., Disp: 90 tablet, Rfl: 3   fluticasone (FLONASE)  50 MCG/ACT nasal spray, Place into the nose as needed., Disp: , Rfl:    Fluticasone-Umeclidin-Vilant (TRELEGY ELLIPTA) 100-62.5-25 MCG/ACT AEPB, Inhale 100 Units into the lungs daily., Disp: 1 each, Rfl: 5   glucose blood (TRUETEST TEST) test strip, Use as instructed, Disp: 100 each, Rfl: 2   insulin degludec (TRESIBA FLEXTOUCH) 200 UNIT/ML FlexTouch Pen, Inject 114 Units into the skin in the morning., Disp: , Rfl:    Insulin Pen Needle (B-D UF III MINI PEN NEEDLES) 31G X 5 MM MISC, USE AS DIRECTED ONCE A DAY, Disp: 100 each, Rfl: 11   lisinopril (ZESTRIL) 20 MG tablet, Take 1 tablet (20 mg total) by mouth daily., Disp: 90 tablet, Rfl: 3   metoprolol succinate (TOPROL XL) 25 MG 24 hr tablet, Take 1 tablet (25 mg total) by mouth daily., Disp: 90 tablet, Rfl: 3   Multiple Vitamin (MULTIVITAMIN WITH MINERALS) TABS tablet, Take 1 tablet by mouth in the morning., Disp: , Rfl:    Potassium Chloride ER 20 MEQ TBCR, Take 60 mEq by mouth daily. (Patient taking differently: Take 60 mEq by mouth in the morning.), Disp: 180 tablet, Rfl: 3   rivaroxaban (XARELTO) 10 MG TABS tablet, Take 1 tablet (10 mg total) by mouth daily., Disp: 30 tablet, Rfl: 3   rosuvastatin  (CRESTOR) 40 MG tablet, Take 1 tablet (40 mg total) by mouth daily., Disp: 90 tablet, Rfl: 3   tamsulosin (FLOMAX) 0.4 MG CAPS capsule, Take 1 capsule (0.4 mg total) by mouth daily after supper., Disp: 90 capsule, Rfl: 0   torsemide (DEMADEX) 20 MG tablet, Take 3 tablets (60 mg total) by mouth daily., Disp: 270 tablet, Rfl: 3   albuterol (VENTOLIN HFA) 108 (90 Base) MCG/ACT inhaler, Inhale 2 puffs into the lungs every 6 (six) hours as needed for wheezing or shortness of breath (take as needed for SOB/wheezing). (Patient not taking: Reported on 11/21/2021), Disp: 8 g, Rfl: 5      Objective:   Vitals:   12/05/21 1454  BP: 130/64  Pulse: 73  Temp: 98 F (36.7 C)  TempSrc: Oral  SpO2: 94%  Weight: (!) 306 lb 9.6 oz (139.1 kg)  Height: 5' 10.5" (1.791 m)    Estimated body mass index is 43.37 kg/m as calculated from the following:   Height as of this encounter: 5' 10.5" (1.791 m).   Weight as of this encounter: 306 lb 9.6 oz (139.1 kg).  '@WEIGHTCHANGE'$ @  [employer]   12/05/21 1454  Weight: (!) 306 lb 9.6 oz (139.1 kg)     Physical Exam   General: No distress. obese Neuro: Alert and Oriented x 3. GCS 15. Speech normal Psych: Pleasant Resp:  Barrel Chest - no.  Wheeze - no, Crackles - no, No overt respiratory distress CVS: Normal heart sounds. Murmurs - no Ext: Stigmata of Connective Tissue Disease - no HEENT: Normal upper airway. PEERL +. No post nasal drip        Assessment:       ICD-10-CM   1. DOE (dyspnea on exertion)  R06.09 D-Dimer, Quantitative    D-Dimer, Quantitative    2. Paraseptal emphysema ([place])  J43.8     3. History of pulmonary embolus (PE)  Z86.711 D-Dimer, Quantitative    D-Dimer, Quantitative    4. Family history of pulmonary embolism  Z82.49     5. OSA (obstructive sleep apnea)  G47.33     6. BMI 40.0-44.9, adult ([place])  Z68.41  Plan:     Patient Instructions     ICD-10-CM   1. DOE (dyspnea on exertion)  R06.09     2.  Paraseptal emphysema ([place])  J43.8     3. History of pulmonary embolus (PE)  Z86.711     4. Family history of pulmonary embolism  Z82.49     5. OSA (obstructive sleep apnea)  G47.33     6. BMI 40.0-44.9, adult ([facility])  Z68.41       Glad shortness of breath is better after trelegy (spiriva did not help) Glad cardiac rehab is going well for you Understand that you want to quit taking xarelto even at the preventative dose of '10mg'$  per day due to easy bruising  Plan  - check d-dimer and discuss implications of these results with Dr Lorenso Courier who has recommended continuing xarelto at this dosing indefniitely - continue trelegy scheduled - continue rehab  [place]  - 6 months or sooner if needed    SIGNATURE    Dr. Brand Males, M.D., F.C.C.P,  Pulmonary and Critical Care Medicine Staff Physician, [place] Director - Interstitial Lung Disease  Program  Pulmonary [place] at [place], [place], [address]  Pager: [phone], If no answer or between  15:00h - 7:00h: call [phone] Telephone: [phone]  3:37 PM 12/05/2021

## 2021-12-05 NOTE — Patient Instructions (Addendum)
ICD-10-CM   1. DOE (dyspnea on exertion)  R06.09     2. 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 History of pulmonary embolus (PE)  Z86.711     4. Family history of pulmonary embolism  Z82.49     5. OSA (obstructive sleep apnea)  G47.33     6. BMI 40.0-44.9, adult (HCC)  Z68.41       Glad shortness of breath is better after trelegy (spiriva did not help) Glad cardiac rehab is going well for you Understand that you want to quit taking xarelto even at the preventative dose of '10mg'$  per day due to easy bruising  Plan  - check d-dimer and discuss implications of these results with Dr Lorenso Courier who has recommended continuing xarelto at this dosing indefniitely - continue trelegy scheduled - continue rehab  Folllowup  - 6 months or sooner if needed

## 2021-12-06 ENCOUNTER — Encounter (HOSPITAL_COMMUNITY)
Admission: RE | Admit: 2021-12-06 | Discharge: 2021-12-06 | Disposition: A | Discharge status: Home / Self Care | Payer: Medicare Other | Source: Ambulatory Visit | Attending: Internal Medicine | Admitting: Internal Medicine

## 2021-12-06 DIAGNOSIS — Z955 Presence of coronary angioplasty implant and graft: Secondary | ICD-10-CM

## 2021-12-08 ENCOUNTER — Encounter (HOSPITAL_COMMUNITY)
Admission: RE | Admit: 2021-12-08 | Discharge: 2021-12-08 | Disposition: A | Discharge status: Home / Self Care | Payer: Medicare Other | Source: Ambulatory Visit | Attending: Internal Medicine | Admitting: Internal Medicine

## 2021-12-08 DIAGNOSIS — Z955 Presence of coronary angioplasty implant and graft: Secondary | ICD-10-CM | POA: Diagnosis not present

## 2021-12-11 ENCOUNTER — Encounter (HOSPITAL_COMMUNITY)
Admission: RE | Admit: 2021-12-11 | Discharge: 2021-12-11 | Disposition: A | Discharge status: Home / Self Care | Payer: Medicare Other | Source: Ambulatory Visit | Attending: Internal Medicine | Admitting: Internal Medicine

## 2021-12-11 DIAGNOSIS — Z955 Presence of coronary angioplasty implant and graft: Secondary | ICD-10-CM | POA: Diagnosis not present

## 2021-12-13 ENCOUNTER — Encounter (HOSPITAL_COMMUNITY)
Admission: RE | Admit: 2021-12-13 | Discharge: 2021-12-13 | Disposition: A | Discharge status: Home / Self Care | Payer: Medicare Other | Source: Ambulatory Visit | Attending: Internal Medicine | Admitting: Internal Medicine

## 2021-12-13 DIAGNOSIS — Z955 Presence of coronary angioplasty implant and graft: Secondary | ICD-10-CM

## 2021-12-14 NOTE — Progress Notes (Signed)
Cardiac Individual Treatment Plan  Patient Details  Name: [name] MRN: [id number] Date of Birth: 24-Feb-1956 Referring Provider:   Flowsheet Row INTENSIVE CARDIAC REHAB ORIENT from 11/21/2021 in [facility]  Referring Provider Werner Lean, MD       Initial Encounter Date:  [facility] from 11/21/2021 in [facility]  Date 11/21/21       Visit Diagnosis: 07/18/21 DES x2 RCA  Patient's Home Medications on Admission:  Current Outpatient Medications:    acetaminophen (TYLENOL) 500 MG tablet, Take 500 mg by mouth every 6 (six) hours as needed (for pain.)., Disp: , Rfl:    albuterol (VENTOLIN HFA) 108 (90 Base) MCG/ACT inhaler, Inhale 2 puffs into the lungs every 6 (six) hours as needed for wheezing or shortness of breath (take as needed for SOB/wheezing). (Patient not taking: Reported on 11/21/2021), Disp: 8 g, Rfl: 5   clopidogrel (PLAVIX) 75 MG tablet, Take 1 tablet (75 mg total) by mouth daily with breakfast., Disp: 90 tablet, Rfl: 3   diltiazem (CARDIZEM CD) 300 MG 24 hr capsule, Take 300 mg by mouth in the morning., Disp: , Rfl:    diphenhydrAMINE (BENADRYL) 25 MG tablet, Take 25 mg by mouth every 6 (six) hours as needed for itching., Disp: , Rfl:    empagliflozin (JARDIANCE) 10 MG TABS tablet, Take 1 tablet (10 mg total) by mouth daily before breakfast., Disp: 90 tablet, Rfl: 3   fluticasone (FLONASE) 50 MCG/ACT nasal spray, Place into the nose as needed., Disp: , Rfl:    Fluticasone-Umeclidin-Vilant (TRELEGY ELLIPTA) 100-62.5-25 MCG/ACT AEPB, Inhale 100 Units into the lungs daily., Disp: 1 each, Rfl: 5   glucose blood (TRUETEST TEST) test strip, Use as instructed, Disp: 100 each, Rfl: 2   insulin degludec (TRESIBA FLEXTOUCH) 200 UNIT/ML FlexTouch Pen, Inject 114 Units into the skin in the morning., Disp: , Rfl:    Insulin Pen Needle (B-D UF III MINI PEN NEEDLES) 31G X 5 MM  MISC, USE AS DIRECTED ONCE A DAY, Disp: 100 each, Rfl: 11   lisinopril (ZESTRIL) 20 MG tablet, Take 1 tablet (20 mg total) by mouth daily., Disp: 90 tablet, Rfl: 3   metoprolol succinate (TOPROL XL) 25 MG 24 hr tablet, Take 1 tablet (25 mg total) by mouth daily., Disp: 90 tablet, Rfl: 3   Multiple Vitamin (MULTIVITAMIN WITH MINERALS) TABS tablet, Take 1 tablet by mouth in the morning., Disp: , Rfl:    Potassium Chloride ER 20 MEQ TBCR, Take 60 mEq by mouth daily. (Patient taking differently: Take 60 mEq by mouth in the morning.), Disp: 180 tablet, Rfl: 3   rivaroxaban (XARELTO) 10 MG TABS tablet, Take 1 tablet (10 mg total) by mouth daily., Disp: 30 tablet, Rfl: 3   rosuvastatin (CRESTOR) 40 MG tablet, Take 1 tablet (40 mg total) by mouth daily., Disp: 90 tablet, Rfl: 3   tamsulosin (FLOMAX) 0.4 MG CAPS capsule, Take 1 capsule (0.4 mg total) by mouth daily after supper., Disp: 90 capsule, Rfl: 0   torsemide (DEMADEX) 20 MG tablet, Take 3 tablets (60 mg total) by mouth daily., Disp: 270 tablet, Rfl: 3  Past Medical History: Past Medical History:  Diagnosis Date   Abdominal aortic aneurysm (AAA), 30-34 mm diameter ([place]) 06/01/2017   Nml w/ greatest dm 3 cm US Aorta 06/2016, Abd [place] 05/01/2017 showed infrarenal dilatation at 3.4 cm - rec repeat US in 3 yrs.   Arthritis  Cervical disc disease 12/26/2011   Coronary artery disease involving native coronary artery of native heart without angina pectoris 12/01/2016   He had coronary angiography in 2011. There was irregularity within the LAD with up to 30% narrowing. The myocardial perfusion imaging done 11/07/2015 did not demonstrate any evidence of ischemia with a low risk nuclear stress test other than EF estimated at 47%. Chest CT 01/25/2016 showed diffuse coronary artery calcifications with heavy calcifications in the LAD. Pt seen by cardiology Dr. Linard Millers    Diabetes mellitus    Difficult intubation 04/05/2014   Dyslipidemia    Family history of  anesthesia complication    pt states took days for his father to awaken after mask was used    Fatty liver 06/01/2017   [place] 05/01/2017   GERD (gastroesophageal reflux disease)    has been having acid reflux since recent endoscopy    H/O hiatal hernia    repair with lap band, now has ventral hernia midline abd   Hearing loss-aides 03/26/2012   Hyperlipidemia    Hypertension    Morbid obesity ([place])    OSA (obstructive sleep apnea)    Rotator cuff tear    Seasonal allergies    Shortness of breath    pt states related to high BP meds with extended walking or climbling stairs   Sleep apnea     Tobacco Use: Social History   Tobacco Use  Smoking Status Former   Packs/day: 4.00   Years: 20.00   Total pack years: 80.00   Types: Cigarettes   Start date: 07/19/1973   Quit date: 12/13/1991   Years since quitting: 30.0  Smokeless Tobacco Never    Labs: Review Flowsheet  More data exists      Latest Ref Rng & Units 06/01/2017 10/01/2017 04/30/2018 08/15/2018  Labs for ITP Cardiac and Pulmonary Rehab  Cholestrol 100 - 199 mg/dL - - 151  147   LDL (calc) 0 - 99 mg/dL - - 67  54   HDL-C >39 mg/dL - - 54  54   Trlycerides 0 - 149 mg/dL - - 152  194   Hemoglobin A1c 4.0 - 5.6 % 7.1  7.8  7.1  7.1   PH, Arterial 7.350 - 7.450 - - - -  PCO2 arterial 32.0 - 48.0 mmHg - - - -  Bicarbonate 20.0 - 28.0 mmol/L - - - -  TCO2 22 - 32 mmol/L - - - -  O2 Saturation % - - - -      07/18/2021  Labs for ITP Cardiac and Pulmonary Rehab  Cholestrol -  LDL (calc) -  HDL-C -  Trlycerides -  Hemoglobin A1c -  PH, Arterial 7.413   PCO2 arterial 45.3   Bicarbonate 30.3  30.4  28.9   TCO2 32  32  30   O2 Saturation 73.0  75.0  94.0     Capillary Blood Glucose: Lab Results  Component Value Date   GLUCAP 131 (H) 12/01/2021   GLUCAP 182 (H) 12/01/2021   GLUCAP 189 (H) 11/29/2021   GLUCAP 209 (H) 11/29/2021   GLUCAP 156 (H) 11/27/2021     Exercise Target Goals: Exercise Program  Goal: Individual exercise prescription set using results from initial 6 min walk test and THRR while considering  patient's activity barriers and safety.   Exercise Prescription Goal: Initial exercise prescription builds to 30-45 minutes a day of aerobic activity, 2-3 days per week.  Home exercise guidelines will be given to  patient during program as part of exercise prescription that the participant will acknowledge.  Activity Barriers & Risk Stratification:  Activity Barriers & Cardiac Risk Stratification - 11/21/21 1111       Activity Barriers & Cardiac Risk Stratification   Activity Barriers Assistive Device;History of Falls;Shortness of Breath;Back Problems;Neck/Spine Problems;Other (comment);Arthritis    Comments Osteoarthritis, bone spurs in spine, heniated discs in neck and lower back. Right, great toe diabetic ulcer x18 months.    Cardiac Risk Stratification Moderate             6 Minute Walk:  6 Minute Walk     Row Name 11/21/21 1141         6 Minute Walk   Phase Initial     Distance 1140 feet     Walk Time 6 minutes     # of Rest Breaks 0     MPH 2.16     METS 1.74     RPE 14     Perceived Dyspnea  2.5     VO2 Peak 6.11     Symptoms Yes (comment)     Comments Participant c/o bilateral thigh fatigue and mild shortness of breath, RPD=2.5.     Resting HR 60 bpm     Resting BP 110/70     Resting Oxygen Saturation  95 %     Exercise Oxygen Saturation  during 6 min walk 96 %     Max Ex. HR 93 bpm     Max Ex. BP 120/52     2 Minute Post BP 100/58              Oxygen Initial Assessment:   Oxygen Re-Evaluation:   Oxygen Discharge (Final Oxygen Re-Evaluation):   Initial Exercise Prescription:  Initial Exercise Prescription - 11/21/21 1200       Date of Initial Exercise RX and Referring Provider   Date 11/21/21    Referring Provider Werner Lean, MD    Expected Discharge Date 01/19/22      Arm Ergometer   Level 2    Watts 35     Minutes 15    METs 2.32      T5 Nustep   Level 2    SPM 85    Minutes 15    METs 2      Prescription Details   Frequency (times per week) 3    Duration Progress to 30 minutes of continuous aerobic without signs/symptoms of physical distress      Intensity   THRR 40-80% of Max Heartrate 66-123    Ratings of Perceived Exertion 11-13    Perceived Dyspnea 0-4      Progression   Progression Continue to progress workloads to maintain intensity without signs/symptoms of physical distress.      Resistance Training   Training Prescription Yes    Weight 3 lbs    Reps 10-15             Perform Capillary Blood Glucose checks as needed.  Exercise Prescription Changes:   Exercise Prescription Changes     Row Name 11/27/21 1459             Response to Exercise   Blood Pressure (Admit) 98/64       Blood Pressure (Exercise) 118/72       Blood Pressure (Exit) 103/69       Heart Rate (Admit) 73 bpm       Heart Rate (Exercise) 81 bpm  Heart Rate (Exit) 73 bpm       Rating of Perceived Exertion (Exercise) 12.5       Perceived Dyspnea (Exercise) 0       Symptoms None       Comments Off to a good start with exercise.       Duration Continue with 30 min of aerobic exercise without signs/symptoms of physical distress.       Intensity THRR unchanged         Progression   Progression Continue to progress workloads to maintain intensity without signs/symptoms of physical distress.       Average METs 1.6         Resistance Training   Training Prescription Yes       Weight 3 lbs       Reps 10-15       Time 10 Minutes         Interval Training   Interval Training No         Arm Ergometer   Level 2       Watts 8       Minutes 15       METs 1.5         T5 Nustep   Level 2       SPM 64       Minutes 15       METs 1.8                Exercise Comments:   Exercise Comments     Row Name 11/27/21 1558           Exercise Comments Patient tolerated low  intensity exercise well without symptoms. Oriented patient to stretches and hand weight exercises.                Exercise Goals and Review:   Exercise Goals     Row Name 11/21/21 1115             Exercise Goals   Increase Physical Activity Yes       Intervention Provide advice, education, support and counseling about physical activity/exercise needs.;Develop an individualized exercise prescription for aerobic and resistive training based on initial evaluation findings, risk stratification, comorbidities and participant's personal goals.       Expected Outcomes Short Term: Attend rehab on a regular basis to increase amount of physical activity.;Long Term: Exercising regularly at least 3-5 days a week.;Long Term: Add in home exercise to make exercise part of routine and to increase amount of physical activity.       Increase Strength and Stamina Yes       Intervention Provide advice, education, support and counseling about physical activity/exercise needs.;Develop an individualized exercise prescription for aerobic and resistive training based on initial evaluation findings, risk stratification, comorbidities and participant's personal goals.       Expected Outcomes Short Term: Increase workloads from initial exercise prescription for resistance, speed, and METs.;Short Term: Perform resistance training exercises routinely during rehab and add in resistance training at home;Long Term: Improve cardiorespiratory fitness, muscular endurance and strength as measured by increased METs and functional capacity (6MWT)       Able to understand and use rate of perceived exertion (RPE) scale Yes       Intervention Provide education and explanation on how to use RPE scale       Expected Outcomes Short Term: Able to use RPE daily in rehab to express subjective intensity level;Long Term:  Able  to use RPE to guide intensity level when exercising independently       Knowledge and understanding of Target  Heart Rate Range (THRR) Yes       Intervention Provide education and explanation of THRR including how the numbers were predicted and where they are located for reference       Expected Outcomes Short Term: Able to state/look up THRR;Long Term: Able to use THRR to govern intensity when exercising independently;Short Term: Able to use daily as guideline for intensity in rehab       Able to check pulse independently Yes       Intervention Provide education and demonstration on how to check pulse in carotid and radial arteries.;Review the importance of being able to check your own pulse for safety during independent exercise       Expected Outcomes Short Term: Able to explain why pulse checking is important during independent exercise;Long Term: Able to check pulse independently and accurately       Understanding of Exercise Prescription Yes       Intervention Provide education, explanation, and written materials on patient's individual exercise prescription       Expected Outcomes Short Term: Able to explain program exercise prescription;Long Term: Able to explain home exercise prescription to exercise independently                Exercise Goals Re-Evaluation :  Exercise Goals Re-Evaluation     Row Name 11/27/21 1558             Exercise Goal Re-Evaluation   Exercise Goals Review Increase Physical Activity;Able to understand and use rate of perceived exertion (RPE) scale       Comments Patient able to understand and use RPE scale appropriately.       Expected Outcomes Progress workloads as tolerated to increase strength and stamina and decrease shortness of breath.                Discharge Exercise Prescription (Final Exercise Prescription Changes):  Exercise Prescription Changes - 11/27/21 1459       Response to Exercise   Blood Pressure (Admit) 98/64    Blood Pressure (Exercise) 118/72    Blood Pressure (Exit) 103/69    Heart Rate (Admit) 73 bpm    Heart Rate (Exercise)  81 bpm    Heart Rate (Exit) 73 bpm    Rating of Perceived Exertion (Exercise) 12.5    Perceived Dyspnea (Exercise) 0    Symptoms None    Comments Off to a good start with exercise.    Duration Continue with 30 min of aerobic exercise without signs/symptoms of physical distress.    Intensity THRR unchanged      Progression   Progression Continue to progress workloads to maintain intensity without signs/symptoms of physical distress.    Average METs 1.6      Resistance Training   Training Prescription Yes    Weight 3 lbs    Reps 10-15    Time 10 Minutes      Interval Training   Interval Training No      Arm Ergometer   Level 2    Watts 8    Minutes 15    METs 1.5      T5 Nustep   Level 2    SPM 64    Minutes 15    METs 1.8             Nutrition:  Target Goals: Understanding of nutrition  guidelines, daily intake of sodium '1500mg'$ , cholesterol '200mg'$ , calories 30% from fat and 7% or less from saturated fats, daily to have 5 or more servings of fruits and vegetables.  Biometrics:  Pre Biometrics - 11/21/21 1017       Pre Biometrics   Waist Circumference 53.5 inches    Hip Circumference 52 inches    Waist to Hip Ratio 1.03 %    Triceps Skinfold 22 mm    % Body Fat 40.9 %    Grip Strength 34 kg    Flexibility 0 in    Single Leg Stand 2.06 seconds              Nutrition Therapy Plan and Nutrition Goals:  Nutrition Therapy & Goals - 11/30/21 0912       Nutrition Therapy   Diet Heart Healthy/ Carbohydrate Consistent      Personal Nutrition Goals   Nutrition Goal Patient to choose a variety of lean protein/plant protein, fruits, vegetables, whole grains, and non fat dairy daily as part of a heart healthy diet.    Personal Goal #2 Patient to reduce daily alcohol intake to <2 5oz glasses of wine.    Personal Goal #3 Patient to limit sodium intake '1500mg'$  per day    Comments Patient reports pre-contemplation regarding reducing alcohol intake from 1 bottle  daily.      Intervention Plan   Intervention Prescribe, educate and counsel regarding individualized specific dietary modifications aiming towards targeted core components such as weight, hypertension, lipid management, diabetes, heart failure and other comorbidities.;Nutrition handout(s) given to patient.    Expected Outcomes Short Term Goal: Understand basic principles of dietary content, such as calories, fat, sodium, cholesterol and nutrients.;Long Term Goal: Adherence to prescribed nutrition plan.             Nutrition Assessments:  Nutrition Assessments - 11/27/21 1536       Rate Your Plate Scores   Pre Score 56            MEDIFICTS Score Key: ?70 Need to make dietary changes  40-70 Heart Healthy Diet ? 40 Therapeutic Level Cholesterol Diet   Flowsheet Row INTENSIVE CARDIAC REHAB from 11/27/2021 in [facility]  Picture Your Plate Total Score on Admission 56      Picture Your Plate Scores: <69 Unhealthy dietary pattern with much room for improvement. 41-50 Dietary pattern unlikely to meet recommendations for good health and room for improvement. 51-60 More healthful dietary pattern, with some room for improvement.  >60 Healthy dietary pattern, although there may be some specific behaviors that could be improved.    Nutrition Goals Re-Evaluation:   Nutrition Goals Re-Evaluation:   Nutrition Goals Discharge (Final Nutrition Goals Re-Evaluation):   Psychosocial: Target Goals: Acknowledge presence or absence of significant depression and/or stress, maximize coping skills, provide positive support system. Participant is able to verbalize types and ability to use techniques and skills needed for reducing stress and depression.  Initial Review & Psychosocial Screening:  Initial Psych Review & Screening - 11/21/21 1338       Initial Review   Current issues with Current Stress Concerns    Source of Stress Concerns Family;Unable to  participate in former interests or hobbies;Unable to perform yard/household activities    Comments [name]'s daughter in law is critically ill at [facility].      Family Dynamics   Good Support System? Yes   [name] has his wife and two sons for support  Barriers   Psychosocial barriers to participate in program The patient should benefit from training in stress management and relaxation.      Screening Interventions   Interventions To provide support and resources with identified psychosocial needs;Encouraged to exercise;Provide feedback about the scores to participant    Expected Outcomes Long Term Goal: Stressors or current issues are controlled or eliminated.;Short Term goal: Identification and review with participant of any Quality of Life or Depression concerns found by scoring the questionnaire.             Quality of Life Scores:  Quality of Life - 11/21/21 1214       Quality of Life   Select Quality of Life      Quality of Life Scores   Health/Function Pre 19.23 %    Socioeconomic Pre 21 %    Psych/Spiritual Pre 20.64 %    Family Pre 21.4 %    GLOBAL Pre 20.21 %            Scores of 19 and below usually indicate a poorer quality of life in these areas.  A difference of  2-3 points is a clinically meaningful difference.  A difference of 2-3 points in the total score of the Quality of Life Index has been associated with significant improvement in overall quality of life, self-image, physical symptoms, and general health in studies assessing change in quality of life.  PHQ-9: Review Flowsheet  More data exists      11/21/2021 11/10/2021 08/15/2018 04/30/2018 10/01/2017  Depression screen PHQ 2/9  Decreased Interest 0 2 0 0 0  Down, Depressed, Hopeless 0 1 0 0 0  PHQ - 2 Score 0 3 0 0 0  Altered sleeping - 2 - - -  Tired, decreased energy - 3 - - -  Change in appetite - 1 - - -  Feeling bad or failure about yourself  - 0 - - -  Trouble concentrating - 0 - - -   Moving slowly or fidgety/restless - 1 - - -  Suicidal thoughts - 0 - - -  PHQ-9 Score - 10 - - -   Interpretation of Total Score  Total Score Depression Severity:  1-4 = Minimal depression, 5-9 = Mild depression, 10-14 = Moderate depression, 15-19 = Moderately severe depression, 20-27 = Severe depression   Psychosocial Evaluation and Intervention:   Psychosocial Re-Evaluation:  Psychosocial Re-Evaluation     Row Name 11/29/21 0846 12/14/21 1457           Psychosocial Re-Evaluation   Current issues with Current Stress Concerns Current Stress Concerns      Comments [name]'s daughter in law passed away this past weekend at the age of 69. Emotional support provided. Reviewed quality of life. 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 wiht the recent loss of his daughter in law and is supporting his son      Expected Outcomes -- [name] will have controlled or dencreased stress upon completion of intensive cardiac rehab      Interventions -- Encouraged to attend Cardiac Rehabilitation for the exercise;Relaxation education;Stress management education      Continue Psychosocial Services  -- Follow up required by staff        Initial Review   Source of Stress Concerns -- Family;Unable to perform yard/household activities;Unable to participate in former interests or hobbies      Comments -- Will continue to montior and offer support as needed  Psychosocial Discharge (Final Psychosocial Re-Evaluation):  Psychosocial Re-Evaluation - 12/14/21 1457       Psychosocial Re-Evaluation   Current issues with Current Stress Concerns    Comments [name] says he coping currently wiht the recent loss of his daughter in law and is supporting his son    Expected Outcomes [name] will have controlled or dencreased stress upon completion of intensive cardiac rehab    Interventions Encouraged to attend Cardiac Rehabilitation for the exercise;Relaxation  education;Stress management education    Continue Psychosocial Services  Follow up required by staff      Initial Review   Source of Stress Concerns Family;Unable to perform yard/household activities;Unable to participate in former interests or hobbies    Comments Will continue to montior and offer support as needed             Vocational Rehabilitation: Provide vocational rehab assistance to qualifying candidates.   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            Education: Education Goals: Education classes will be provided on a weekly basis, covering required topics. Participant will state understanding/return demonstration of topics presented.    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  Clinical staff conducted group or individual video education with verbal and written material and guidebook.  Patient learns the recommended [name] exercise program. Exercise with the goal of living a long, healthy life. Some of the health benefits of exercise include controlled diabetes, healthier blood pressure levels, improved cholesterol levels, improved heart and lung capacity, improved sleep, and better body composition. Everyone should speak with their doctor before starting or changing an exercise routine.  Biomechanical Limitations Clinical staff conducted group or individual video education with  verbal and written material and guidebook.  Patient learns how biomechanical limitations can impact exercise and how we can mitigate and possibly overcome limitations to have an impactful and balanced exercise routine.  Body Composition Clinical staff conducted group or individual video education with verbal and written material and guidebook.  Patient learns that body composition (ratio of muscle mass to fat mass) is a key component to assessing overall fitness, rather than body weight alone. Increased fat mass, especially visceral belly fat, can put [place] at increased risk for metabolic syndrome, type 2 diabetes, heart disease, and even death. It is recommended to combine diet and exercise (cardiovascular and resistance training) to improve your body composition. Seek guidance from your physician and exercise physiologist before implementing an exercise routine.  Exercise Action Plan Clinical staff conducted group or individual video education with verbal and written material and guidebook.  Patient learns the recommended strategies to achieve and enjoy long-term exercise adherence, including variety, self-motivation, self-efficacy, and positive decision making. Benefits of exercise include fitness, good health, weight  management, more energy, better sleep, less stress, and overall well-being.  Medical   Heart Disease Risk Reduction Clinical staff conducted group or individual video education with verbal and written material and guidebook.  Patient learns our heart is our most vital organ as it circulates oxygen, nutrients, white blood cells, and hormones throughout the entire body, and carries waste away. Data supports a plant-based eating plan like the [name] Program for its effectiveness in slowing progression of and reversing heart disease. The video provides a number of recommendations to address heart disease.   Metabolic Syndrome and Belly Fat  Clinical staff conducted group or individual  video education with verbal and written material and guidebook.  Patient learns what metabolic syndrome is, how it leads to heart disease, and how one can reverse it and keep it from coming back. You have metabolic syndrome if you have 3 of the following 5 criteria: abdominal obesity, high blood pressure, high triglycerides, low HDL cholesterol, and high blood sugar.  Hypertension and Heart Disease Clinical staff conducted group or individual video education with verbal and written material and guidebook.  Patient learns that high blood pressure, or hypertension, is very common in the [place]. Hypertension is largely due to excessive salt intake, but other important risk factors include being overweight, physical inactivity, drinking too much alcohol, smoking, and not eating enough potassium from fruits and vegetables. High blood pressure is a leading risk factor for heart attack, stroke, congestive heart failure, dementia, kidney failure, and premature death. Long-term effects of excessive salt intake include stiffening of the arteries and thickening of heart muscle and organ damage. Recommendations include ways to reduce hypertension and the risk of heart disease.  Diseases of Our Time - Focusing on Diabetes Clinical staff conducted group or individual video education with verbal and written material and guidebook.  Patient learns why the best way to stop diseases of our time is prevention, through food and other lifestyle changes. Medicine (such as prescription pills and surgeries) is often only a Band-Aid on the problem, not a long-term solution. Most common diseases of our time include obesity, type 2 diabetes, hypertension, heart disease, and cancer. The [name] Program is recommended and has been proven to help reduce, reverse, and/or prevent the damaging effects of metabolic syndrome.  Nutrition   Overview of the [name] Eating Plan  Clinical staff conducted group or individual video  education with verbal and written material and guidebook.  Patient learns about the [place] for disease risk reduction. The [place] emphasizes a wide variety of unrefined, minimally-processed carbohydrates, like fruits, vegetables, whole grains, and legumes. Go, Caution, and Stop food choices are explained. Plant-based and lean animal proteins are emphasized. Rationale provided for low sodium intake for blood pressure control, low added sugars for blood sugar stabilization, and low added fats and oils for coronary artery disease risk reduction and weight management.  Calorie Density  Clinical staff conducted group or individual video education with verbal and written material and guidebook.  Patient learns about calorie density and how it impacts the [name] Eating Plan. Knowing the characteristics of the food you choose will help you decide whether those foods will lead to weight gain or weight loss, and whether you want to consume more or less of them. Weight loss is usually a side effect of the [name] Eating Plan because of its focus on low calorie-dense foods.  Label Reading  Clinical staff conducted group or individual video education with verbal and written material and  guidebook.  Patient learns about the [name] recommended label reading guidelines and corresponding recommendations regarding calorie density, added sugars, sodium content, and whole grains.  Dining Out - Part 1  Clinical staff conducted group or individual video education with verbal and written material and guidebook.  Patient learns that restaurant meals can be sabotaging because they can be so high in calories, fat, sodium, and/or sugar. Patient learns recommended strategies on how to positively address this and avoid unhealthy pitfalls.  Facts on Fats  Clinical staff conducted group or individual video education with verbal and written material and guidebook.  Patient learns that lifestyle  modifications can be just as effective, if not more so, as many medications for lowering your risk of heart disease. A [name] lifestyle can help to reduce your risk of inflammation and atherosclerosis (cholesterol build-up, or plaque, in the artery walls). Lifestyle interventions such as dietary choices and physical activity address the cause of atherosclerosis. A review of the types of fats and their impact on blood cholesterol levels, along with dietary recommendations to reduce fat intake is also included.  Nutrition Action Plan  Clinical staff conducted group or individual video education with verbal and written material and guidebook.  Patient learns how to incorporate [name] recommendations into their lifestyle. Recommendations include planning and keeping personal health goals in mind as an important part of their success.  Healthy Mind-Set    Healthy Minds, Bodies, Hearts  Clinical staff conducted group or individual video education with verbal and written material and guidebook.  Patient learns how to identify when they are stressed. Video will discuss the impact of that stress, as well as the many benefits of stress management. Patient will also be introduced to stress management techniques. The way we think, act, and feel has an impact on our hearts.  How Our Thoughts Can Heal Our Hearts  Clinical staff conducted group or individual video education with verbal and written material and guidebook.  Patient learns that negative thoughts can cause depression and anxiety. This can result in negative lifestyle behavior and serious health problems. Cognitive behavioral therapy is an effective method to help control our thoughts in order to change and improve our emotional outlook.  Additional Videos:  Exercise    Improving Performance  Clinical staff conducted group or individual video education with verbal and written material and guidebook.  Patient learns to use a non-linear approach  by alternating intensity levels and lengths of time spent exercising to help burn more calories and lose more body fat. Cardiovascular exercise helps improve heart health, metabolism, hormonal balance, blood sugar control, and recovery from fatigue. Resistance training improves strength, endurance, balance, coordination, reaction time, metabolism, and muscle mass. Flexibility exercise improves circulation, posture, and balance. Seek guidance from your physician and exercise physiologist before implementing an exercise routine and learn your capabilities and proper form for all exercise.  Introduction to Yoga  Clinical staff conducted group or individual video education with verbal and written material and guidebook.  Patient learns about yoga, a discipline of the coming together of mind, breath, and body. The benefits of yoga include improved flexibility, improved range of motion, better posture and core strength, increased lung function, weight loss, and positive self-image. Yoga's heart health benefits include lowered blood pressure, healthier heart rate, decreased cholesterol and triglyceride levels, improved immune function, and reduced stress. Seek guidance from your physician and exercise physiologist before implementing an exercise routine and learn your capabilities and proper form for all exercise.  Medical  Aging: Automotive engineer of Life  Clinical staff conducted group or individual video education with verbal and written material and guidebook.  Patient learns key strategies and recommendations to stay in good physical health and enhance quality of life, such as prevention strategies, having an advocate, securing a [place], and keeping a list of medications and system for tracking them. It also discusses how to avoid risk for bone loss.  Biology of Weight Control  Clinical staff conducted group or individual video education with verbal and written  material and guidebook.  Patient learns that weight gain occurs because we consume more calories than we burn (eating more, moving less). Even if your body weight is normal, you may have higher ratios of fat compared to muscle mass. Too much body fat puts you at increased risk for cardiovascular disease, heart attack, stroke, type 2 diabetes, and obesity-related cancers. In addition to exercise, following the [place] can help reduce your risk.  Decoding Lab Results  Clinical staff conducted group or individual video education with verbal and written material and guidebook.  Patient learns that lab test reflects one measurement whose values change over time and are influenced by many factors, including medication, stress, sleep, exercise, food, hydration, pre-existing medical conditions, and more. It is recommended to use the knowledge from this video to become more involved with your lab results and evaluate your numbers to speak with your doctor.   Diseases of Our Time - Overview  Clinical staff conducted group or individual video education with verbal and written material and guidebook.  Patient learns that according to the CDC, 50% to 70% of chronic diseases (such as obesity, type 2 diabetes, elevated lipids, hypertension, and heart disease) are avoidable through lifestyle improvements including healthier food choices, listening to satiety cues, and increased physical activity.  Sleep Disorders Clinical staff conducted group or individual video education with verbal and written material and guidebook.  Patient learns how good quality and duration of sleep are important to overall health and well-being. Patient also learns about sleep disorders and how they impact health along with recommendations to address them, including discussing with a physician.  Nutrition  Dining Out - Part 2 Clinical staff conducted group or individual video education with verbal and written material and  guidebook.  Patient learns how to plan ahead and communicate in order to maximize their dining experience in a healthy and nutritious manner. Included are recommended food choices based on the type of restaurant the patient is visiting.   Fueling a Best boy conducted group or individual video education with verbal and written material and guidebook.  There is a strong connection between our food choices and our health. Diseases like obesity and type 2 diabetes are very prevalent and are in large-part due to lifestyle choices. The [name] Eating Plan provides plenty of food and hunger-curbing satisfaction. It is easy to follow, affordable, and helps reduce health risks.  Menu Workshop  Clinical staff conducted group or individual video education with verbal and written material and guidebook.  Patient learns that restaurant meals can sabotage health goals because they are often packed with calories, fat, sodium, and sugar. Recommendations include strategies to plan ahead and to communicate with the manager, chef, or server to help order a healthier meal.  Planning Your Eating Strategy  Clinical staff conducted group or individual video education with verbal and written material and guidebook.  Patient learns about the [place]  and its benefit of reducing the risk of disease. The [place] does not focus on calories. Instead, it emphasizes high-quality, nutrient-rich foods. By knowing the characteristics of the foods, we choose, we can determine their calorie density and make informed decisions.  Targeting Your Nutrition Priorities  Clinical staff conducted group or individual video education with verbal and written material and guidebook.  Patient learns that lifestyle habits have a tremendous impact on disease risk and progression. This video provides eating and physical activity recommendations based on your personal health goals, such as reducing LDL  cholesterol, losing weight, preventing or controlling type 2 diabetes, and reducing high blood pressure.  Vitamins and Minerals  Clinical staff conducted group or individual video education with verbal and written material and guidebook.  Patient learns different ways to obtain key vitamins and minerals, including through a recommended healthy diet. It is important to discuss all supplements you take with your doctor.   Healthy Mind-Set    Smoking Cessation  Clinical staff conducted group or individual video education with verbal and written material and guidebook.  Patient learns that cigarette smoking and tobacco addiction pose a serious health risk which affects millions of people. Stopping smoking will significantly reduce the risk of heart disease, lung disease, and many forms of cancer. Recommended strategies for quitting are covered, including working with your doctor to develop a successful plan.  Culinary   Becoming a Financial trader conducted group or individual video education with verbal and written material and guidebook.  Patient learns that cooking at home can be healthy, cost-effective, quick, and puts them in control. Keys to cooking healthy recipes will include looking at your recipe, assessing your equipment needs, planning ahead, making it simple, choosing cost-effective seasonal ingredients, and limiting the use of added fats, salts, and sugars.  Cooking - Breakfast and Snacks  Clinical staff conducted group or individual video education with verbal and written material and guidebook.  Patient learns how important breakfast is to satiety and nutrition through the entire day. Recommendations include key foods to eat during breakfast to help stabilize blood sugar levels and to prevent overeating at meals later in the day. Planning ahead is also a key component.  Cooking - Human resources officer conducted group or individual video education with verbal  and written material and guidebook.  Patient learns eating strategies to improve overall health, including an approach to cook more at home. Recommendations include thinking of animal protein as a side on your plate rather than center stage and focusing instead on lower calorie dense options like vegetables, fruits, whole grains, and plant-based proteins, such as beans. Making sauces in large quantities to freeze for later and leaving the skin on your vegetables are also recommended to maximize your experience.  Cooking - Healthy Salads and Dressing Clinical staff conducted group or individual video education with verbal and written material and guidebook.  Patient learns that vegetables, fruits, whole grains, and legumes are the foundations of the [place]. Recommendations include how to incorporate each of these in flavorful and healthy salads, and how to create homemade salad dressings. Proper handling of ingredients is also covered. Cooking - Soups and [employer] - Soups and Desserts Clinical staff conducted group or individual video education with verbal and written material and guidebook.  Patient learns that [name] soups and desserts make for easy, nutritious, and delicious snacks and meal components that are low in sodium, fat, sugar, and calorie density,  while high in vitamins, minerals, and filling fiber. Recommendations include simple and healthy ideas for soups and desserts.   Overview     The [name] Solution Program Overview Clinical staff conducted group or individual video education with verbal and written material and guidebook.  Patient learns that the results of the [place] Program have been documented in more than 100 articles published in peer-reviewed journals, and the benefits include reducing risk factors for (and, in some cases, even reversing) high cholesterol, high blood pressure, type 2 diabetes, obesity, and more! An overview of the three key pillars  of the [name] Program will be covered: eating well, doing regular exercise, and having a healthy mind-set.  WORKSHOPS  Exercise: Exercise Basics: Building Your Action Plan Clinical staff led group instruction and group discussion with PowerPoint presentation and patient guidebook. To enhance the learning environment the use of posters, models and videos may be added. At the conclusion of this workshop, patients will comprehend the difference between physical activity and exercise, as well as the benefits of incorporating both, into their routine. Patients will understand the FITT (Frequency, Intensity, Time, and Type) principle and how to use it to build an exercise action plan. In addition, safety concerns and other considerations for exercise and cardiac rehab will be addressed by the presenter. The purpose of this lesson is to promote a comprehensive and effective weekly exercise routine in order to improve patients' overall level of fitness.   Managing Heart Disease: Your Path to a Healthier Heart Clinical staff led group instruction and group discussion with PowerPoint presentation and patient guidebook. To enhance the learning environment the use of posters, models and videos may be added.At the conclusion of this workshop, patients will understand the anatomy and physiology of the heart. Additionally, they will understand how [name]'s three pillars impact the risk factors, the progression, and the management of heart disease.  The purpose of this lesson is to provide a high-level overview of the heart, heart disease, and how the [name] lifestyle positively impacts risk factors.  Exercise Biomechanics Clinical staff led group instruction and group discussion with PowerPoint presentation and patient guidebook. To enhance the learning environment the use of posters, models and videos may be added. Patients will learn how the structural parts of their bodies function and how these  functions impact their daily activities, movement, and exercise. Patients will learn how to promote a neutral spine, learn how to manage pain, and identify ways to improve their physical movement in order to promote healthy living. The purpose of this lesson is to expose patients to common physical limitations that impact physical activity. Participants will learn practical ways to adapt and manage aches and pains, and to minimize their effect on regular exercise. Patients will learn how to maintain good posture while sitting, walking, and lifting.  Balance Training and Fall Prevention  Clinical staff led group instruction and group discussion with PowerPoint presentation and patient guidebook. To enhance the learning environment the use of posters, models and videos may be added. At the conclusion of this workshop, patients will understand the importance of their sensorimotor skills (vision, proprioception, and the vestibular system) in maintaining their ability to balance as they age. Patients will apply a variety of balancing exercises that are appropriate for their current level of function. Patients will understand the common causes for poor balance, possible solutions to these problems, and ways to modify their physical environment in order to minimize their fall risk. The purpose of this lesson is to teach  patients about the importance of maintaining balance as they age and ways to minimize their risk of falling.  WORKSHOPS   Nutrition:  Fueling a Scientist, research (physical sciences) led group instruction and group discussion with PowerPoint presentation and patient guidebook. To enhance the learning environment the use of posters, models and videos may be added. Patients will review the foundational principles of the [place] and understand what constitutes a serving size in each of the food groups. Patients will also learn [name]-friendly foods that are better choices when away from  home and review make-ahead meal and snack options. Calorie density will be reviewed and applied to three nutrition priorities: weight maintenance, weight loss, and weight gain. The purpose of this lesson is to reinforce (in a group setting) the key concepts around what patients are recommended to eat and how to apply these guidelines when away from home by planning and selecting [name]-friendly options. Patients will understand how calorie density may be adjusted for different weight management goals.  Mindful Eating  Clinical staff led group instruction and group discussion with PowerPoint presentation and patient guidebook. To enhance the learning environment the use of posters, models and videos may be added. Patients will briefly review the concepts of the [place] and the importance of low-calorie dense foods. The concept of mindful eating will be introduced as well as the importance of paying attention to internal hunger signals. Triggers for non-hunger eating and techniques for dealing with triggers will be explored. The purpose of this lesson is to provide patients with the opportunity to review the basic principles of the [place], discuss the value of eating mindfully and how to measure internal cues of hunger and fullness using the Hunger Scale. Patients will also discuss reasons for non-hunger eating and learn strategies to use for controlling emotional eating.  Targeting Your Nutrition Priorities Clinical staff led group instruction and group discussion with PowerPoint presentation and patient guidebook. To enhance the learning environment the use of posters, models and videos may be added. Patients will learn how to determine their genetic susceptibility to disease by reviewing their family history. Patients will gain insight into the importance of diet as part of an overall healthy lifestyle in mitigating the impact of genetics and other environmental insults. The  purpose of this lesson is to provide patients with the opportunity to assess their personal nutrition priorities by looking at their family history, their own health history and current risk factors. Patients will also be able to discuss ways of prioritizing and modifying the [place] for their highest risk areas  Menu  Clinical staff led group instruction and group discussion with PowerPoint presentation and patient guidebook. To enhance the learning environment the use of posters, models and videos may be added. Using menus brought in from [employer], or printed from [employer], patients will apply the [place] dining out guidelines that were presented in the [employer] video. Patients will also be able to practice these guidelines in a variety of provided scenarios. The purpose of this lesson is to provide patients with the opportunity to practice hands-on learning of the [place] with actual menus and practice scenarios.  Label Reading Clinical staff led group instruction and group discussion with PowerPoint presentation and patient guidebook. To enhance the learning environment the use of posters, models and videos may be added. Patients will review and discuss the [name] label reading guidelines presented in [name]'s Label Reading Educational series  video. Using fool labels brought in from local grocery stores and markets, patients will apply the label reading guidelines and determine if the packaged food meet the [name] guidelines. The purpose of this lesson is to provide patients with the opportunity to review, discuss, and practice hands-on learning of the [name] Label Reading guidelines with actual packaged food labels. [place] Workshops are designed to teach patients ways to prepare quick, simple, and affordable recipes at home. The importance of nutrition's role in chronic disease risk  reduction is reflected in its emphasis in the overall [name] program. By learning how to prepare essential core [name] Eating Plan recipes, patients will increase control over what they eat; be able to customize the flavor of foods without the use of added salt, sugar, or fat; and improve the quality of the food they consume. By learning a set of core recipes which are easily assembled, quickly prepared, and affordable, patients are more likely to prepare more healthy foods at home. These workshops focus on convenient breakfasts, simple entres, side dishes, and desserts which can be prepared with minimal effort and are consistent with nutrition recommendations for cardiovascular risk reduction. Cooking [employer] are taught by a Engineer, materials (RD) who has been trained by the [employer]. The chef or RD has a clear understanding of the importance of minimizing - if not completely eliminating - added fat, sugar, and sodium in recipes. Throughout the series of [place] Workshop sessions, patients will learn about healthy ingredients and efficient methods of cooking to build confidence in their capability to prepare    Cooking School weekly topics:  Adding Flavor- Sodium-Free  Fast and Healthy Breakfasts  Powerhouse Plant-Based Proteins  Satisfying Salads and Dressings  Simple Sides and Sauces  International Cuisine-Spotlight on the [place] Zones  Delicious Desserts  Savory Soups  Efficiency Cooking - Meals in a Snap  Tasty Appetizers and Snacks  Comforting Weekend Breakfasts  One-Pot Wonders   Fast Evening Meals  Easy [place] (Psychosocial): New Thoughts, New Behaviors Clinical staff led group instruction and group discussion with PowerPoint presentation and patient guidebook. To enhance the learning environment the use of posters, models and videos may be added. Patients will learn  and practice techniques for developing effective health and lifestyle goals. Patients will be able to effectively apply the goal setting process learned to develop at least one new personal goal.  The purpose of this lesson is to expose patients to a new skill set of behavior modification techniques such as techniques setting SMART goals, overcoming barriers, and achieving new thoughts and new behaviors.  Managing Moods and Relationships Clinical staff led group instruction and group discussion with PowerPoint presentation and patient guidebook. To enhance the learning environment the use of posters, models and videos may be added. Patients will learn how emotional and chronic stress factors can impact their health and relationships. They will learn healthy ways to manage their moods and utilize positive coping mechanisms. In addition, ICR patients will learn ways to improve communication skills. The purpose of this lesson is to expose patients to ways of understanding how one's mood and health are intimately connected. Developing a healthy outlook can help build positive relationships and connections with others. Patients will understand the importance of utilizing effective communication skills that include actively listening and being heard. They will learn and understand the importance of the "4 Cs" and especially Connections  in fostering of a Healthy Mind-Set.  Healthy Sleep for a Healthy Heart Clinical staff led group instruction and group discussion with PowerPoint presentation and patient guidebook. To enhance the learning environment the use of posters, models and videos may be added. At the conclusion of this workshop, patients will be able to demonstrate knowledge of the importance of sleep to overall health, well-being, and quality of life. They will understand the symptoms of, and treatments for, common sleep disorders. Patients will also be able to identify daytime and nighttime behaviors which  impact sleep, and they will be able to apply these tools to help manage sleep-related challenges. The purpose of this lesson is to provide patients with a general overview of sleep and outline the importance of quality sleep. Patients will learn about a few of the most common sleep disorders. Patients will also be introduced to the concept of "sleep hygiene," and discover ways to self-manage certain sleeping problems through simple daily behavior changes. Finally, the workshop will motivate patients by clarifying the links between quality sleep and their goals of heart-healthy living.   Recognizing and Reducing Stress Clinical staff led group instruction and group discussion with PowerPoint presentation and patient guidebook. To enhance the learning environment the use of posters, models and videos may be added. At the conclusion of this workshop, patients will be able to understand the types of stress reactions, differentiate between acute and chronic stress, and recognize the impact that chronic stress has on their health. They will also be able to apply different coping mechanisms, such as reframing negative self-talk. Patients will have the opportunity to practice a variety of stress management techniques, such as deep abdominal breathing, progressive muscle relaxation, and/or guided imagery.  The purpose of this lesson is to educate patients on the role of stress in their lives and to provide healthy techniques for coping with it.  Learning Barriers/Preferences:  Learning Barriers/Preferences - 11/21/21 1341       Learning Barriers/Preferences   Learning Barriers Sight;Hearing   Wears glasses and is hard of hearing.   Learning Preferences Audio             Education Topics:  Knowledge Questionnaire Score:  Knowledge Questionnaire Score - 11/21/21 1215       Knowledge Questionnaire Score   Pre Score 21/24             Core Components/Risk Factors/Patient Goals at Admission:   Personal Goals and Risk Factors at Admission - 11/21/21 1106       Core Components/Risk Factors/Patient Goals on Admission    Weight Management Yes;Obesity;Weight Loss    Intervention Weight Management/Obesity: Establish reasonable short term and long term weight goals.;Obesity: Provide education and appropriate resources to help participant work on and attain dietary goals.    Admit Weight 305 lb 8.9 oz (138.6 kg)    Expected Outcomes Short Term: Continue to assess and modify interventions until short term weight is achieved;Long Term: Adherence to nutrition and physical activity/exercise program aimed toward attainment of established weight goal;Weight Loss: Understanding of general recommendations for a balanced deficit meal plan, which promotes 1-2 lb weight loss per week and includes a negative energy balance of [phone] kcal/d    Improve shortness of breath with ADL's Yes    Intervention Provide education, individualized exercise plan and daily activity instruction to help decrease symptoms of SOB with activities of daily living.    Expected Outcomes Short Term: Improve cardiorespiratory fitness to achieve a reduction of symptoms when performing ADLs;Long  Term: Be able to perform more ADLs without symptoms or delay the onset of symptoms    Diabetes Yes    Intervention Provide education about signs/symptoms and action to take for hypo/hyperglycemia.;Provide education about proper nutrition, including hydration, and aerobic/resistive exercise prescription along with prescribed medications to achieve blood glucose in normal ranges: Fasting glucose 65-99 mg/dL    Expected Outcomes Short Term: Participant verbalizes understanding of the signs/symptoms and immediate care of hyper/hypoglycemia, proper foot care and importance of medication, aerobic/resistive exercise and nutrition plan for blood glucose control.;Long Term: Attainment of HbA1C < 7%.    Hypertension Yes    Intervention Provide education  on lifestyle modifcations including regular physical activity/exercise, weight management, moderate sodium restriction and increased consumption of fresh fruit, vegetables, and low fat dairy, alcohol moderation, and smoking cessation.;Monitor prescription use compliance.    Expected Outcomes Short Term: Continued assessment and intervention until BP is < 140/42m HG in hypertensive participants. < 130/839mHG in hypertensive participants with diabetes, heart failure or chronic kidney disease.;Long Term: Maintenance of blood pressure at goal levels.    Lipids Yes    Intervention Provide education and support for participant on nutrition & aerobic/resistive exercise along with prescribed medications to achieve LDL '70mg'$ , HDL >'40mg'$ .    Expected Outcomes Short Term: Participant states understanding of desired cholesterol values and is compliant with medications prescribed. Participant is following exercise prescription and nutrition guidelines.;Long Term: Cholesterol controlled with medications as prescribed, with individualized exercise RX and with personalized nutrition plan. Value goals: LDL < '70mg'$ , HDL > 40 mg.    Stress Yes    Intervention Offer individual and/or small group education and counseling on adjustment to heart disease, stress management and health-related lifestyle change. Teach and support self-help strategies.;Refer participants experiencing significant psychosocial distress to appropriate mental health specialists for further evaluation and treatment. When possible, include family members and significant others in education/counseling sessions.    Expected Outcomes Short Term: Participant demonstrates changes in health-related behavior, relaxation and other stress management skills, ability to obtain effective social support, and compliance with psychotropic medications if prescribed.;Long Term: Emotional wellbeing is indicated by absence of clinically significant psychosocial distress or  social isolation.             Core Components/Risk Factors/Patient Goals Review:   Goals and Risk Factor Review     Row Name 11/29/21 1402 12/14/21 1500           Core Components/Risk Factors/Patient Goals Review   Personal Goals Review Weight Management/Obesity;Hypertension;Lipids;Diabetes;Stress Weight Management/Obesity;Hypertension;Lipids;Diabetes;Stress      Review MaTravoristarted cardiac rehab on 11/27/21. MaBrackenid well with exercise. Vital signs and CBG's were stable. MaGurnieas been doing well with exercise. Vital signs  have been stableand CBG's have been variable      Expected Outcomes MaTunisill continue to participate in phase 2 cardiac rehab for exercise, nutrition and lifestyle modifications MaJackyill continue to participate in phase 2 cardiac rehab for exercise, nutrition and lifestyle modifications               Core Components/Risk Factors/Patient Goals at Discharge (Final Review):   Goals and Risk Factor Review - 12/14/21 1500       Core Components/Risk Factors/Patient Goals Review   Personal Goals Review Weight Management/Obesity;Hypertension;Lipids;Diabetes;Stress    Review MaDomanikas been doing well with exercise. Vital signs  have been stableand CBG's have been variable    Expected Outcomes MaRanjitill continue to participate in phase 2 cardiac rehab for exercise, nutrition  and lifestyle modifications             ITP Comments:  ITP Comments     Row Name 11/21/21 1017 11/29/21 0845 12/14/21 1456       ITP Comments Medical Director- Dr. Fransico Him, MD. Introduction to [name] program provided at cardiac rehab orientation. Reviewed initial resource packet with the patient. Patient will review packet at home. Question's answered. 30 Day ITP Review. Derryck started cardiac rehab on 11/27/21 and did well with exercise. 30 Day ITP Review. Kain is off to a good start to exercise at intensive cardiac rehab. Ibn is doing well  with exercise.              Comments: See ITP comments.Harrell Gave RN BSN

## 2021-12-15 ENCOUNTER — Encounter (HOSPITAL_COMMUNITY)
Admission: RE | Admit: 2021-12-15 | Discharge: 2021-12-15 | Disposition: A | Discharge status: Home / Self Care | Payer: Medicare Other | Source: Ambulatory Visit | Attending: Internal Medicine | Admitting: Internal Medicine

## 2021-12-15 DIAGNOSIS — Z955 Presence of coronary angioplasty implant and graft: Secondary | ICD-10-CM | POA: Diagnosis not present

## 2021-12-18 ENCOUNTER — Encounter (HOSPITAL_COMMUNITY)
Admission: RE | Admit: 2021-12-18 | Discharge: 2021-12-18 | Disposition: A | Discharge status: Home / Self Care | Payer: Medicare Other | Source: Ambulatory Visit | Attending: Internal Medicine | Admitting: Internal Medicine

## 2021-12-18 DIAGNOSIS — Z48812 Encounter for surgical aftercare following surgery on the circulatory system: Secondary | ICD-10-CM | POA: Insufficient documentation

## 2021-12-18 DIAGNOSIS — Z955 Presence of coronary angioplasty implant and graft: Secondary | ICD-10-CM | POA: Diagnosis not present

## 2021-12-18 NOTE — Progress Notes (Signed)
CARDIAC REHAB PHASE 2  Reviewed home exercise with pt today. Pt is tolerating exercise well. Pt will continue to exercise on his own by walking and recumbent stepper elliptical for 30-45 minutes per session 1-2 days a week in addition to the 3 days in CRP2. Advised pt on THRR, RPE scale, hydration and temperature/humidity precautions. Reinforced NTG use (encouraged pt to fill prescription but he has not yet), S/S to stop exercise and when to call MD vs 911. Encouraged warm up cool down and stretches with exercise sessions. Pt verbalized understanding, all questions were answered and pt was given a copy to take home.    Kirby Funk ACSM-CEP 12/18/2021 4:22 PM

## 2021-12-20 ENCOUNTER — Encounter (HOSPITAL_COMMUNITY)
Admission: RE | Admit: 2021-12-20 | Discharge: 2021-12-20 | Disposition: A | Discharge status: Home / Self Care | Payer: Medicare Other | Source: Ambulatory Visit | Attending: Internal Medicine | Admitting: Internal Medicine

## 2021-12-20 DIAGNOSIS — Z955 Presence of coronary angioplasty implant and graft: Secondary | ICD-10-CM

## 2021-12-20 NOTE — Progress Notes (Signed)
Non fasting CBG's have ranged from the mid to upper 200's at intensive cardiac rehab. [name] is exercising more but has not changed his diet, Will fax exercise flow sheets to Dr. Dennie Fetters office for review.[name], RN,BSN 12/20/2021 4:36 PM

## 2021-12-22 ENCOUNTER — Encounter (HOSPITAL_COMMUNITY)
Admission: RE | Admit: 2021-12-22 | Discharge: 2021-12-22 | Disposition: A | Discharge status: Home / Self Care | Payer: Medicare Other | Source: Ambulatory Visit | Attending: Internal Medicine | Admitting: Internal Medicine

## 2021-12-22 DIAGNOSIS — Z955 Presence of coronary angioplasty implant and graft: Secondary | ICD-10-CM

## 2021-12-25 ENCOUNTER — Encounter (HOSPITAL_COMMUNITY)
Admission: RE | Admit: 2021-12-25 | Discharge: 2021-12-25 | Disposition: A | Discharge status: Home / Self Care | Payer: Medicare Other | Source: Ambulatory Visit | Attending: Internal Medicine | Admitting: Internal Medicine

## 2021-12-25 DIAGNOSIS — Z955 Presence of coronary angioplasty implant and graft: Secondary | ICD-10-CM | POA: Diagnosis not present

## 2021-12-27 ENCOUNTER — Encounter (HOSPITAL_COMMUNITY)
Admission: RE | Admit: 2021-12-27 | Discharge: 2021-12-27 | Disposition: A | Discharge status: Home / Self Care | Payer: Medicare Other | Source: Ambulatory Visit | Attending: Internal Medicine | Admitting: Internal Medicine

## 2021-12-27 DIAGNOSIS — Z955 Presence of coronary angioplasty implant and graft: Secondary | ICD-10-CM | POA: Diagnosis not present

## 2021-12-29 ENCOUNTER — Encounter (HOSPITAL_COMMUNITY)
Admission: RE | Admit: 2021-12-29 | Discharge: 2021-12-29 | Disposition: A | Discharge status: Home / Self Care | Payer: Medicare Other | Source: Ambulatory Visit | Attending: Internal Medicine | Admitting: Internal Medicine

## 2021-12-29 DIAGNOSIS — Z955 Presence of coronary angioplasty implant and graft: Secondary | ICD-10-CM

## 2021-12-29 NOTE — Progress Notes (Signed)
[name] 66 y.o. male  [name] is pre-contemplative of making lifestyle changes to aid with cardiac  rehab. Patient has medical history of DESx2 RCA, CAD, Abdominal aortic aneurysm, HTN, heart failure, OSA, paraseptal emphysema, fatty liver, DM2, hyperlipidemia, hx of lapband (currently no fill/restriction), covid-19 long hauler. He is retired. He lives at home with his wife, who was present today. He lives part-time in [place] and part-time in [place]. His daughter in law recently passed away unexpectedly in December 07, 2021. Blood sugars have been elevated in the afternoons in the upper 200s; he reports <160s in am/fasting. His blood sugar is managed by his PCP. Cardiology has recommended reducing alcohol/wine intake to <2 glasses per day and a 2L fluid restriction.   Supplements: Probiotic   RMR: 2023 (x1.3, 2630kcals)  Labs: A1c 7.1 (self reported), Cr 1.40, GFR 55  24 hour diet recall:  Breakfast8:30-9:30: coffee (almond milk, stevia) dry cereal (frosted mini wheat) OR scrambled egg, veggie, [place] bacon,  OR two fried eggs, bacon, 8oz coke  Lunch: watermelon, leftover pizza OR [employer] side salad, chicken nuggets OR tuna with miracle whip, bread or 15 ritz crackers OR chiptole  Snack: chips, fish crackers,wine (full bottle/daily) Dinner: fried shrimp, fries, caesar salad  After dinner: almond milk ice cream  Beverages: slight peach tea, coffee, bai   GI concerns: loose stools/diarrhea 2-3x/day; hx of lap band, no fill    Nutrition Diagnosis Morbid Obesity related to excessive energy intake as evidenced by a 43.3 Excessive sodium intake related to over consumption of processed food as evidenced by frequent consumption of convenience food and eating out frequently.  Nutrition Intervention Pt's individual nutrition plan reviewed with pt. Benefits of adopting Heart Healthy diet discussed.   Continue client-centered nutrition education by RD, as part of interdisciplinary  care.  Monitor/Evaluation: Patient reports pre-contemplation to make lifestyle changes for adherence to heart healthy diet recommendation, blood sugar control, and weight management. Patient and wife with good nutrition knowledge; however, application/compliance remains variable. We discussed the importance of reducing/eliminating sugary drinks, reducing alcohol intake, portions sizes, reducing frequency of eating out.  Handouts/notes given (the plate method, protein snacks, strategies for weight loss, Pritikin plan, etc). Discussed further follow-up for blood sugar management, bariatric [place] management,etc. Patient amicable to RD suggestions and verbalizes understanding. Will follow-up as needed.   83 minutes spent in review of topics related to a heart healthy diet including sodium intake, blood sugar control, weight management, label reading, hydration, and fiber intake. Start Time: 1:45pm End Time: 3:08pm  Goal(s) Patient to limit saturated fat and trans fat and prioritize fish, poultry, nonfat dairy, and vegetarian protein sources 2.   Patient to increase fiber intake through whole grains, fruits, and vegetables. Patient to  limit refined carbohydrates, simple sugars. 3.   Patient to prioritize water and other zero calorie alternatives; limit/eliminate any sugar beverages.  4. Patient to identify high sodium food choices and reduce sodium intake to <2300 mg, aiming for 1500 mg daily. 5. Patient to practice mindful and intuitive eating exercise including eating on a schedule, understanding hunger/fullness, etc. 6. Patient to identify strategies and food quantities to achieve weight/weight gain of 0.5-2.0# per week  7. Patient to limit/eliminate alcohol intake to <2 glasses of wine per day. Consider fluid recommendations of 2L daily and calories from alcohol.   Plan:  Will provide client-centered nutrition education as part of interdisciplinary care Monitor and evaluate progress toward  nutrition goal with team. Patient given the option to attend Pritikin education, cooking  workshops, and educational videos   Eladio [place], MS, RDN, LDN

## 2022-01-01 ENCOUNTER — Encounter (HOSPITAL_COMMUNITY)
Admission: RE | Admit: 2022-01-01 | Discharge: 2022-01-01 | Disposition: A | Discharge status: Home / Self Care | Payer: Medicare Other | Source: Ambulatory Visit | Attending: Internal Medicine | Admitting: Internal Medicine

## 2022-01-01 DIAGNOSIS — Z955 Presence of coronary angioplasty implant and graft: Secondary | ICD-10-CM | POA: Diagnosis not present

## 2022-01-03 ENCOUNTER — Telehealth (HOSPITAL_COMMUNITY): Payer: Self-pay | Admitting: Family Medicine

## 2022-01-03 ENCOUNTER — Encounter (HOSPITAL_COMMUNITY): Payer: Medicare Other

## 2022-01-05 ENCOUNTER — Telehealth (HOSPITAL_COMMUNITY): Payer: Self-pay | Admitting: Family Medicine

## 2022-01-05 ENCOUNTER — Encounter (HOSPITAL_COMMUNITY): Payer: Medicare Other

## 2022-01-08 ENCOUNTER — Encounter (HOSPITAL_COMMUNITY)
Admission: RE | Admit: 2022-01-08 | Discharge: 2022-01-08 | Disposition: A | Discharge status: Home / Self Care | Payer: Medicare Other | Source: Ambulatory Visit | Attending: Internal Medicine | Admitting: Internal Medicine

## 2022-01-08 DIAGNOSIS — Z955 Presence of coronary angioplasty implant and graft: Secondary | ICD-10-CM

## 2022-01-10 ENCOUNTER — Encounter (HOSPITAL_COMMUNITY)
Admission: RE | Admit: 2022-01-10 | Discharge: 2022-01-10 | Disposition: A | Discharge status: Home / Self Care | Payer: Medicare Other | Source: Ambulatory Visit | Attending: Internal Medicine | Admitting: Internal Medicine

## 2022-01-10 DIAGNOSIS — Z955 Presence of coronary angioplasty implant and graft: Secondary | ICD-10-CM | POA: Diagnosis not present

## 2022-01-10 NOTE — Progress Notes (Signed)
Cardiac Individual Treatment Plan  Patient Details  Name: TYRESE CAPRIOTTI MRN: [id number] Date of Birth: 24-Feb-1956 Referring Provider:   Flowsheet Row INTENSIVE CARDIAC REHAB ORIENT from 11/21/2021 in [facility]  Referring Provider Werner Lean, MD       Initial Encounter Date:  [facility] from 11/21/2021 in [facility]  Date 11/21/21       Visit Diagnosis: 07/18/21 DES x2 RCA  Patient's Home Medications on Admission:  Current Outpatient Medications:    acetaminophen (TYLENOL) 500 MG tablet, Take 500 mg by mouth every 6 (six) hours as needed (for pain.)., Disp: , Rfl:    albuterol (VENTOLIN HFA) 108 (90 Base) MCG/ACT inhaler, Inhale 2 puffs into the lungs every 6 (six) hours as needed for wheezing or shortness of breath (take as needed for SOB/wheezing). (Patient not taking: Reported on 11/21/2021), Disp: 8 g, Rfl: 5   clopidogrel (PLAVIX) 75 MG tablet, Take 1 tablet (75 mg total) by mouth daily with breakfast., Disp: 90 tablet, Rfl: 3   diltiazem (CARDIZEM CD) 300 MG 24 hr capsule, Take 300 mg by mouth in the morning., Disp: , Rfl:    diphenhydrAMINE (BENADRYL) 25 MG tablet, Take 25 mg by mouth every 6 (six) hours as needed for itching., Disp: , Rfl:    empagliflozin (JARDIANCE) 10 MG TABS tablet, Take 1 tablet (10 mg total) by mouth daily before breakfast., Disp: 90 tablet, Rfl: 3   fluticasone (FLONASE) 50 MCG/ACT nasal spray, Place into the nose as needed., Disp: , Rfl:    Fluticasone-Umeclidin-Vilant (TRELEGY ELLIPTA) 100-62.5-25 MCG/ACT AEPB, Inhale 100 Units into the lungs daily., Disp: 1 each, Rfl: 5   glucose blood (TRUETEST TEST) test strip, Use as instructed, Disp: 100 each, Rfl: 2   insulin degludec (TRESIBA FLEXTOUCH) 200 UNIT/ML FlexTouch Pen, Inject 114 Units into the skin in the morning., Disp: , Rfl:    Insulin Pen Needle (B-D UF III MINI PEN NEEDLES) 31G X 5 MM  MISC, USE AS DIRECTED ONCE A DAY, Disp: 100 each, Rfl: 11   lisinopril (ZESTRIL) 20 MG tablet, Take 1 tablet (20 mg total) by mouth daily., Disp: 90 tablet, Rfl: 3   metoprolol succinate (TOPROL XL) 25 MG 24 hr tablet, Take 1 tablet (25 mg total) by mouth daily., Disp: 90 tablet, Rfl: 3   Multiple Vitamin (MULTIVITAMIN WITH MINERALS) TABS tablet, Take 1 tablet by mouth in the morning., Disp: , Rfl:    Potassium Chloride ER 20 MEQ TBCR, Take 60 mEq by mouth daily. (Patient taking differently: Take 60 mEq by mouth in the morning.), Disp: 180 tablet, Rfl: 3   rivaroxaban (XARELTO) 10 MG TABS tablet, Take 1 tablet (10 mg total) by mouth daily., Disp: 30 tablet, Rfl: 3   rosuvastatin (CRESTOR) 40 MG tablet, Take 1 tablet (40 mg total) by mouth daily., Disp: 90 tablet, Rfl: 3   tamsulosin (FLOMAX) 0.4 MG CAPS capsule, Take 1 capsule (0.4 mg total) by mouth daily after supper., Disp: 90 capsule, Rfl: 0   torsemide (DEMADEX) 20 MG tablet, Take 3 tablets (60 mg total) by mouth daily., Disp: 270 tablet, Rfl: 3  Past Medical History: Past Medical History:  Diagnosis Date   Abdominal aortic aneurysm (AAA), 30-34 mm diameter ([place]) 06/01/2017   Nml w/ greatest dm 3 cm US Aorta 06/2016, Abd [place] 05/01/2017 showed infrarenal dilatation at 3.4 cm - rec repeat US in 3 yrs.   Arthritis  Cervical disc disease 12/26/2011   Coronary artery disease involving native coronary artery of native heart without angina pectoris 12/01/2016   He had coronary angiography in 2011. There was irregularity within the LAD with up to 30% narrowing. The myocardial perfusion imaging done 11/07/2015 did not demonstrate any evidence of ischemia with a low risk nuclear stress test other than EF estimated at 47%. Chest CT 01/25/2016 showed diffuse coronary artery calcifications with heavy calcifications in the LAD. Pt seen by cardiology Dr. Linard Millers    Diabetes mellitus    Difficult intubation 04/05/2014   Dyslipidemia    Family history of  anesthesia complication    pt states took days for his father to awaken after mask was used    Fatty liver 06/01/2017   [place] 05/01/2017   GERD (gastroesophageal reflux disease)    has been having acid reflux since recent endoscopy    H/O hiatal hernia    repair with lap band, now has ventral hernia midline abd   Hearing loss-aides 03/26/2012   Hyperlipidemia    Hypertension    Morbid obesity ([place])    OSA (obstructive sleep apnea)    Rotator cuff tear    Seasonal allergies    Shortness of breath    pt states related to high BP meds with extended walking or climbling stairs   Sleep apnea     Tobacco Use: Social History   Tobacco Use  Smoking Status Former   Packs/day: 4.00   Years: 20.00   Total pack years: 80.00   Types: Cigarettes   Start date: 07/19/1973   Quit date: 12/13/1991   Years since quitting: 30.0  Smokeless Tobacco Never    Labs: Review Flowsheet  More data exists      Latest Ref Rng & Units 06/01/2017 10/01/2017 04/30/2018 08/15/2018 07/18/2021  Labs for ITP Cardiac and Pulmonary Rehab  Cholestrol 100 - 199 mg/dL - - 151  147  -  LDL (calc) 0 - 99 mg/dL - - 67  54  -  HDL-C >39 mg/dL - - 54  54  -  Trlycerides 0 - 149 mg/dL - - 152  194  -  Hemoglobin A1c 4.0 - 5.6 % 7.1  7.8  7.1  7.1  -  PH, Arterial 7.350 - 7.450 - - - - 7.413   PCO2 arterial 32.0 - 48.0 mmHg - - - - 45.3   Bicarbonate 20.0 - 28.0 mmol/L - - - - 30.3  30.4  28.9   TCO2 22 - 32 mmol/L - - - - 32  32  30   O2 Saturation % - - - - 73.0  75.0  94.0     Capillary Blood Glucose: Lab Results  Component Value Date   GLUCAP 131 (H) 12/01/2021   GLUCAP 182 (H) 12/01/2021   GLUCAP 189 (H) 11/29/2021   GLUCAP 209 (H) 11/29/2021   GLUCAP 156 (H) 11/27/2021     Exercise Target Goals: Exercise Program Goal: Individual exercise prescription set using results from initial 6 min walk test and THRR while considering  patient's activity barriers and safety.   Exercise Prescription Goal: Initial  exercise prescription builds to 30-45 minutes a day of aerobic activity, 2-3 days per week.  Home exercise guidelines will be given to patient during program as part of exercise prescription that the participant will acknowledge.  Activity Barriers & Risk Stratification:  Activity Barriers & Cardiac Risk Stratification - 11/21/21 1111       Activity Barriers &  Cardiac Risk Stratification   Activity Barriers Assistive Device;History of Falls;Shortness of Breath;Back Problems;Neck/Spine Problems;Other (comment);Arthritis    Comments Osteoarthritis, bone spurs in spine, heniated discs in neck and lower back. Right, great toe diabetic ulcer x18 months.    Cardiac Risk Stratification Moderate             6 Minute Walk:  6 Minute Walk     Row Name 11/21/21 1141         6 Minute Walk   Phase Initial     Distance 1140 feet     Walk Time 6 minutes     # of Rest Breaks 0     MPH 2.16     METS 1.74     RPE 14     Perceived Dyspnea  2.5     VO2 Peak 6.11     Symptoms Yes (comment)     Comments Participant c/o bilateral thigh fatigue and mild shortness of breath, RPD=2.5.     Resting HR 60 bpm     Resting BP 110/70     Resting Oxygen Saturation  95 %     Exercise Oxygen Saturation  during 6 min walk 96 %     Max Ex. HR 93 bpm     Max Ex. BP 120/52     2 Minute Post BP 100/58              Oxygen Initial Assessment:   Oxygen Re-Evaluation:   Oxygen Discharge (Final Oxygen Re-Evaluation):   Initial Exercise Prescription:  Initial Exercise Prescription - 11/21/21 1200       Date of Initial Exercise RX and Referring Provider   Date 11/21/21    Referring Provider Werner Lean, MD    Expected Discharge Date 01/19/22      Arm Ergometer   Level 2    Watts 35    Minutes 15    METs 2.32      T5 Nustep   Level 2    SPM 85    Minutes 15    METs 2      Prescription Details   Frequency (times per week) 3    Duration Progress to 30 minutes of continuous  aerobic without signs/symptoms of physical distress      Intensity   THRR 40-80% of Max Heartrate 66-123    Ratings of Perceived Exertion 11-13    Perceived Dyspnea 0-4      Progression   Progression Continue to progress workloads to maintain intensity without signs/symptoms of physical distress.      Resistance Training   Training Prescription Yes    Weight 3 lbs    Reps 10-15             Perform Capillary Blood Glucose checks as needed.  Exercise Prescription Changes:   Exercise Prescription Changes     Row Name 11/27/21 1459 12/18/21 1610 01/01/22 1614         Response to Exercise   Blood Pressure (Admit) 98/64 112/72 104/62     Blood Pressure (Exercise) 118/72 122/62 122/68     Blood Pressure (Exit) 103/69 118/78 105/64     Heart Rate (Admit) 73 bpm 75 bpm 77 bpm     Heart Rate (Exercise) 81 bpm 85 bpm 86 bpm     Heart Rate (Exit) 73 bpm 70 bpm 76 bpm     Rating of Perceived Exertion (Exercise) 12._0 Perceived Dyspnea (Exercise) 0 0  0     Symptoms None None None     Comments Off to a good start with exercise. reviewed METS, goals, and home exrx. Reviewed MET's     Duration Continue with 30 min of aerobic exercise without signs/symptoms of physical distress. Continue with 30 min of aerobic exercise without signs/symptoms of physical distress. Continue with 30 min of aerobic exercise without signs/symptoms of physical distress.     Intensity THRR unchanged THRR unchanged THRR unchanged       Progression   Progression Continue to progress workloads to maintain intensity without signs/symptoms of physical distress. Continue to progress workloads to maintain intensity without signs/symptoms of physical distress. Continue to progress workloads to maintain intensity without signs/symptoms of physical distress.     Average METs 1.6 1.9 1.85       Resistance Training   Training Prescription Yes Yes Yes     Weight 3 lbs 4 lbs 4 lbs     Reps 10-15 10-15 10-15      Time 10 Minutes 10 Minutes 10 Minutes       Interval Training   Interval Training No No No       Arm Ergometer   Level _0 Watts 8 -- --     Minutes _1 METs 1.5 1.9 1.7       T5 Nustep   Level _2 SPM 64 64 64     Minutes _3 METs 1.8 1.9 2       Home Exercise Plan   Plans to continue exercise at -- Home (comment) Home (comment)     Frequency -- Add 2 additional days to program exercise sessions. Add 2 additional days to program exercise sessions.     Initial Home Exercises Provided -- 12/18/21 12/18/21              Exercise Comments:   Exercise Comments     Row Name 11/27/21 1558 12/18/21 1618 01/01/22 1617       Exercise Comments Patient tolerated low intensity exercise well without symptoms. Oriented patient to stretches and hand weight exercises. Reviewed METs, goals, and home exercise. Patient tolerated exercise well with an average MET level of 1.9. Patient will continue to exercise on his own by using his recumbent elliptical stepper and walking 1-2 days for 30-45 minutes per session. Patient feels like he is gaining strength over time. Will continue to work on control of SOB. Reviewed METs. Patient tolerated exercise well with an average MET level of 1.85. Pt is maintaining MET's, but having more conversations on increasing MET's and SPM for endurance              Exercise Goals and Review:   Exercise Goals     Row Name 11/21/21 1115             Exercise Goals   Increase Physical Activity Yes       Intervention Provide advice, education, support and counseling about physical activity/exercise needs.;Develop an individualized exercise prescription for aerobic and resistive training based on initial evaluation findings, risk stratification, comorbidities and participant's personal goals.       Expected Outcomes Short Term: Attend rehab on a regular basis to increase amount of physical activity.;Long Term: Exercising  regularly at least 3-5 days a week.;Long Term: Add in home exercise to make exercise part of routine and to increase  amount of physical activity.       Increase Strength and Stamina Yes       Intervention Provide advice, education, support and counseling about physical activity/exercise needs.;Develop an individualized exercise prescription for aerobic and resistive training based on initial evaluation findings, risk stratification, comorbidities and participant's personal goals.       Expected Outcomes Short Term: Increase workloads from initial exercise prescription for resistance, speed, and METs.;Short Term: Perform resistance training exercises routinely during rehab and add in resistance training at home;Long Term: Improve cardiorespiratory fitness, muscular endurance and strength as measured by increased METs and functional capacity (6MWT)       Able to understand and use rate of perceived exertion (RPE) scale Yes       Intervention Provide education and explanation on how to use RPE scale       Expected Outcomes Short Term: Able to use RPE daily in rehab to express subjective intensity level;Long Term:  Able to use RPE to guide intensity level when exercising independently       Knowledge and understanding of Target Heart Rate Range (THRR) Yes       Intervention Provide education and explanation of THRR including how the numbers were predicted and where they are located for reference       Expected Outcomes Short Term: Able to state/look up THRR;Long Term: Able to use THRR to govern intensity when exercising independently;Short Term: Able to use daily as guideline for intensity in rehab       Able to check pulse independently Yes       Intervention Provide education and demonstration on how to check pulse in carotid and radial arteries.;Review the importance of being able to check your own pulse for safety during independent exercise       Expected Outcomes Short Term: Able to explain why pulse  checking is important during independent exercise;Long Term: Able to check pulse independently and accurately       Understanding of Exercise Prescription Yes       Intervention Provide education, explanation, and written materials on patient's individual exercise prescription       Expected Outcomes Short Term: Able to explain program exercise prescription;Long Term: Able to explain home exercise prescription to exercise independently                Exercise Goals Re-Evaluation :  Exercise Goals Re-Evaluation     Row Name 11/27/21 1558 12/18/21 1614           Exercise Goal Re-Evaluation   Exercise Goals Review Increase Physical Activity;Able to understand and use rate of perceived exertion (RPE) scale Increase Physical Activity;Able to understand and use rate of perceived exertion (RPE) scale;Increase Strength and Stamina;Knowledge and understanding of Target Heart Rate Range (THRR);Understanding of Exercise Prescription      Comments Patient able to understand and use RPE scale appropriately. Reviewed METs, goals, and home exercise. Patient tolerated exercise well with an average MET level of 1.9. Patient will continue to exercise on his own by using his recumbent elliptical stepper and walking 1-2 days for 30-45 minutes per session. Patient feels like he is gaining strength over time. Will continue to work on control of SOB.      Expected Outcomes Progress workloads as tolerated to increase strength and stamina and decrease shortness of breath. Patient will add in 1-2 days of exercise on his own. Will continue to monitor patient and progress as tolerated without s/s.  Discharge Exercise Prescription (Final Exercise Prescription Changes):  Exercise Prescription Changes - 01/01/22 1614       Response to Exercise   Blood Pressure (Admit) 104/62    Blood Pressure (Exercise) 122/68    Blood Pressure (Exit) 105/64    Heart Rate (Admit) 77 bpm    Heart Rate (Exercise)  86 bpm    Heart Rate (Exit) 76 bpm    Rating of Perceived Exertion (Exercise) 12    Perceived Dyspnea (Exercise) 0    Symptoms None    Comments Reviewed MET's    Duration Continue with 30 min of aerobic exercise without signs/symptoms of physical distress.    Intensity THRR unchanged      Progression   Progression Continue to progress workloads to maintain intensity without signs/symptoms of physical distress.    Average METs 1.85      Resistance Training   Training Prescription Yes    Weight 4 lbs    Reps 10-15    Time 10 Minutes      Interval Training   Interval Training No      Arm Ergometer   Level 3    Minutes 15    METs 1.7      T5 Nustep   Level 5    SPM 64    Minutes 15    METs 2      Home Exercise Plan   Plans to continue exercise at Home (comment)    Frequency Add 2 additional days to program exercise sessions.    Initial Home Exercises Provided 12/18/21             Nutrition:  Target Goals: Understanding of nutrition guidelines, daily intake of sodium <1548m, cholesterol <2062m calories 30% from fat and 7% or less from saturated fats, daily to have 5 or more servings of fruits and vegetables.  Biometrics:  Pre Biometrics - 11/21/21 1017       Pre Biometrics   Waist Circumference 53.5 inches    Hip Circumference 52 inches    Waist to Hip Ratio 1.03 %    Triceps Skinfold 22 mm    % Body Fat 40.9 %    Grip Strength 34 kg    Flexibility 0 in    Single Leg Stand 2.06 seconds              Nutrition Therapy Plan and Nutrition Goals:  Nutrition Therapy & Goals - 12/29/21 0829       Nutrition Therapy   Diet Heart Healthy/ Carbohydrate Consistent      Personal Nutrition Goals   Nutrition Goal Patient to choose a variety of lean protein/plant protein, fruits, vegetables, whole grains, and non fat dairy daily as part of a heart healthy diet.    Personal Goal #2 Patient to reduce daily alcohol intake to <2 5oz glasses of wine.    Personal  Goal #3 Patient to limit sodium intake <150074mer day    Comments Met with patient and his wife one on one to discuss weight loss, blood sugar control, alcohol intake, and heart health. Patient remains precontemplative of lifestyle changes.      Intervention Plan   Intervention Prescribe, educate and counsel regarding individualized specific dietary modifications aiming towards targeted core components such as weight, hypertension, lipid management, diabetes, heart failure and other comorbidities.;Nutrition handout(s) given to patient.    Expected Outcomes Short Term Goal: Understand basic principles of dietary content, such as calories, fat, sodium, cholesterol and nutrients.;Long Term Goal: Adherence  to prescribed nutrition plan.;Short Term Goal: A plan has been developed with personal nutrition goals set during dietitian appointment.             Nutrition Assessments:  Nutrition Assessments - 11/27/21 1536       Rate Your Plate Scores   Pre Score 56            MEDIFICTS Score Key: ?70 Need to make dietary changes  40-70 Heart Healthy Diet ? 40 Therapeutic Level Cholesterol Diet   Flowsheet Row INTENSIVE CARDIAC REHAB from 11/27/2021 in [facility]  Picture Your Plate Total Score on Admission 56      Picture Your Plate Scores: <05 Unhealthy dietary pattern with much room for improvement. 41-50 Dietary pattern unlikely to meet recommendations for good health and room for improvement. 51-60 More healthful dietary pattern, with some room for improvement.  >60 Healthy dietary pattern, although there may be some specific behaviors that could be improved.    Nutrition Goals Re-Evaluation:  Nutrition Goals Re-Evaluation     [place] Name 12/29/21 0829             Goals   Current Weight 304 lb (137.9 kg)       Expected Outcome Met with patient and his wife one on one to discuss weight loss, blood sugar control, alcohol intake, and heart health.  Patient remains precontemplative of lifestyle changes.                Nutrition Goals Re-Evaluation:  Nutrition Goals Re-Evaluation     [place] Name 12/29/21 0829             Goals   Current Weight 304 lb (137.9 kg)       Expected Outcome Met with patient and his wife one on one to discuss weight loss, blood sugar control, alcohol intake, and heart health. Patient remains precontemplative of lifestyle changes.                Nutrition Goals Discharge (Final Nutrition Goals Re-Evaluation):  Nutrition Goals Re-Evaluation - 12/29/21 0829       Goals   Current Weight 304 lb (137.9 kg)    Expected Outcome Met with patient and his wife one on one to discuss weight loss, blood sugar control, alcohol intake, and heart health. Patient remains precontemplative of lifestyle changes.             Psychosocial: Target Goals: Acknowledge presence or absence of significant depression and/or stress, maximize coping skills, provide positive support system. Participant is able to verbalize types and ability to use techniques and skills needed for reducing stress and depression.  Initial Review & Psychosocial Screening:  Initial Psych Review & Screening - 11/21/21 1338       Initial Review   Current issues with Current Stress Concerns    Source of Stress Concerns Family;Unable to participate in former interests or hobbies;Unable to perform yard/household activities    Comments Bakari's daughter in law is critically ill at [facility].      Family Dynamics   Good Support System? Yes   Abhijay has his wife and two sons for support     Barriers   Psychosocial barriers to participate in program The patient should benefit from training in stress management and relaxation.      Screening Interventions   Interventions To provide support and resources with identified psychosocial needs;Encouraged to exercise;Provide feedback about the scores to participant    Expected Outcomes Long Term  Goal: Stressors or current issues are controlled or eliminated.;Short Term goal: Identification and review with participant of any Quality of Life or Depression concerns found by scoring the questionnaire.             Quality of Life Scores:  Quality of Life - 11/21/21 1214       Quality of Life   Select Quality of Life      Quality of Life Scores   Health/Function Pre 19.23 %    Socioeconomic Pre 21 %    Psych/Spiritual Pre 20.64 %    Family Pre 21.4 %    GLOBAL Pre 20.21 %            Scores of 19 and below usually indicate a poorer quality of life in these areas.  A difference of  2-3 points is a clinically meaningful difference.  A difference of 2-3 points in the total score of the Quality of Life Index has been associated with significant improvement in overall quality of life, self-image, physical symptoms, and general health in studies assessing change in quality of life.  PHQ-9: Review Flowsheet  More data exists      11/21/2021 11/10/2021 08/15/2018 04/30/2018 10/01/2017  Depression screen PHQ 2/9  Decreased Interest 0 2 0 0 0  Down, Depressed, Hopeless 0 1 0 0 0  PHQ - 2 Score 0 3 0 0 0  Altered sleeping - 2 - - -  Tired, decreased energy - 3 - - -  Change in appetite - 1 - - -  Feeling bad or failure about yourself  - 0 - - -  Trouble concentrating - 0 - - -  Moving slowly or fidgety/restless - 1 - - -  Suicidal thoughts - 0 - - -  PHQ-9 Score - 10 - - -   Interpretation of Total Score  Total Score Depression Severity:  1-4 = Minimal depression, 5-9 = Mild depression, 10-14 = Moderate depression, 15-19 = Moderately severe depression, 20-27 = Severe depression   Psychosocial Evaluation and Intervention:   Psychosocial Re-Evaluation:  Psychosocial Re-Evaluation     Row Name 11/29/21 0846 12/14/21 1457 01/10/22 1732         Psychosocial Re-Evaluation   Current issues with Current Stress Concerns Current Stress Concerns Current Stress Concerns      Comments Breon's daughter in law passed away this past weekend at the age of 38. Emotional support provided. Reviewed quality of life. 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 No other stressors voiced     Expected Outcomes -- Glennie will have controlled or dencreased stress upon completion of intensive cardiac rehab Alpha will have controlled or dencreased stress upon completion of intensive cardiac rehab     Interventions -- Encouraged to attend Cardiac Rehabilitation for the exercise;Relaxation education;Stress management education Encouraged to attend Cardiac Rehabilitation for the exercise;Relaxation education;Stress management education     Continue Psychosocial Services  -- Follow up required by staff Follow up required by staff       Initial Review   Source of Stress Concerns -- Family;Unable to perform yard/household activities;Unable to participate in former interests or hobbies Family;Unable to perform yard/household activities;Unable to participate in former interests or hobbies     Comments -- Will continue to montior and offer support  as needed Will continue to montior and offer support as needed              Psychosocial Discharge (Final Psychosocial Re-Evaluation):  Psychosocial Re-Evaluation - 01/10/22 1732       Psychosocial Re-Evaluation   Current issues with Current Stress Concerns    Comments Lionell's says he coping currently wiht the recent loss of his daughter in law and is supporting his son. No other stressors voiced    Expected Outcomes Michai will have controlled or dencreased stress upon completion of intensive cardiac rehab    Interventions Encouraged to attend Cardiac Rehabilitation for the exercise;Relaxation education;Stress management education     Continue Psychosocial Services  Follow up required by staff      Initial Review   Source of Stress Concerns Family;Unable to perform yard/household activities;Unable to participate in former interests or hobbies    Comments Will continue to montior and offer support as needed             Vocational Rehabilitation: Provide vocational rehab assistance to qualifying candidates.   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            Education: Education Goals: Education classes will be provided on a weekly basis, covering required topics. Participant will state understanding/return demonstration of topics presented.    Education - 01/10/22 1500       Education   Cardiac Education Topics Pritikin    Education officer, museum Exercise Physiologist    Weekly Topic Simple Sides and Sauces    Instruction Review Code 1- Verbalizes Understanding    Class Start Time 4650    Class Stop Time 1439    Class Time Calculation (min) 52 min             Core Videos: Exercise    Move It!  Clinical staff conducted group or individual video education with verbal and written material and guidebook.  Patient learns the recommended Pritikin exercise program. Exercise with the goal of living a long, healthy life. Some of the health benefits of exercise include controlled diabetes, healthier blood pressure levels, improved cholesterol levels, improved heart and lung capacity, improved sleep, and better body composition. Everyone should speak with their doctor before starting or changing an exercise routine.  Biomechanical Limitations Clinical staff conducted group or individual video education with verbal and  written material and guidebook.  Patient learns how biomechanical limitations can impact exercise and how we can mitigate and possibly overcome limitations to have an impactful and balanced exercise routine.  Body Composition Clinical staff conducted group or individual video education with verbal and written material and guidebook.  Patient learns that body composition (ratio of muscle mass to fat mass) is a key component to assessing overall fitness, rather than body weight alone. Increased fat mass, especially visceral belly fat, can put [place] at increased risk for metabolic syndrome, type 2 diabetes, heart disease, and even death. It is recommended to combine diet and exercise (cardiovascular and resistance training) to improve your body composition. Seek guidance from your physician and exercise physiologist before implementing an exercise routine.  Exercise Action Plan Clinical staff conducted group or individual video education with  verbal and written material and guidebook.  Patient learns the recommended strategies to achieve and enjoy long-term exercise adherence, including variety, self-motivation, self-efficacy, and positive decision making. Benefits of exercise include fitness, good health, weight management, more energy, better sleep, less stress, and overall well-being.  Medical   Heart Disease Risk Reduction Clinical staff conducted group or individual video education with verbal and written material and guidebook.  Patient learns our heart is our most vital organ as it circulates oxygen, nutrients, white blood cells, and hormones throughout the entire body, and carries waste away. Data supports a plant-based eating plan like the Pritikin Program for its effectiveness in slowing progression of and reversing heart disease. The video provides a number of recommendations to address heart disease.   Metabolic Syndrome and Belly Fat  Clinical staff conducted group or individual video  education with verbal and written material and guidebook.  Patient learns what metabolic syndrome is, how it leads to heart disease, and how one can reverse it and keep it from coming back. You have metabolic syndrome if you have 3 of the following 5 criteria: abdominal obesity, high blood pressure, high triglycerides, low HDL cholesterol, and high blood sugar.  Hypertension and Heart Disease Clinical staff conducted group or individual video education with verbal and written material and guidebook.  Patient learns that high blood pressure, or hypertension, is very common in the [place]. Hypertension is largely due to excessive salt intake, but other important risk factors include being overweight, physical inactivity, drinking too much alcohol, smoking, and not eating enough potassium from fruits and vegetables. High blood pressure is a leading risk factor for heart attack, stroke, congestive heart failure, dementia, kidney failure, and premature death. Long-term effects of excessive salt intake include stiffening of the arteries and thickening of heart muscle and organ damage. Recommendations include ways to reduce hypertension and the risk of heart disease.  Diseases of Our Time - Focusing on Diabetes Clinical staff conducted group or individual video education with verbal and written material and guidebook.  Patient learns why the best way to stop diseases of our time is prevention, through food and other lifestyle changes. Medicine (such as prescription pills and surgeries) is often only a Band-Aid on the problem, not a long-term solution. Most common diseases of our time include obesity, type 2 diabetes, hypertension, heart disease, and cancer. The Pritikin Program is recommended and has been proven to help reduce, reverse, and/or prevent the damaging effects of metabolic syndrome.  Nutrition   Overview of the Pritikin Eating Plan  Clinical staff conducted group or individual video education  with verbal and written material and guidebook.  Patient learns about the [place] for disease risk reduction. The [place] emphasizes a wide variety of unrefined, minimally-processed carbohydrates, like fruits, vegetables, whole grains, and legumes. Go, Caution, and Stop food choices are explained. Plant-based and lean animal proteins are emphasized. Rationale provided for low sodium intake for blood pressure control, low added sugars for blood sugar stabilization, and low added fats and oils for coronary artery disease risk reduction and weight management.  Calorie Density  Clinical staff conducted group or individual video education with verbal and written material and guidebook.  Patient learns about calorie density and how it impacts the Pritikin Eating Plan. Knowing the characteristics of the food you choose will help you decide whether those foods will lead to weight gain or weight loss, and whether you want to consume more or less of them. Weight loss is  usually a side effect of the [place] because of its focus on low calorie-dense foods.  Label Reading  Clinical staff conducted group or individual video education with verbal and written material and guidebook.  Patient learns about the Pritikin recommended label reading guidelines and corresponding recommendations regarding calorie density, added sugars, sodium content, and whole grains.  Dining Out - Part 1  Clinical staff conducted group or individual video education with verbal and written material and guidebook.  Patient learns that restaurant meals can be sabotaging because they can be so high in calories, fat, sodium, and/or sugar. Patient learns recommended strategies on how to positively address this and avoid unhealthy pitfalls.  Facts on Fats  Clinical staff conducted group or individual video education with verbal and written material and guidebook.  Patient learns that lifestyle modifications  can be just as effective, if not more so, as many medications for lowering your risk of heart disease. A Pritikin lifestyle can help to reduce your risk of inflammation and atherosclerosis (cholesterol build-up, or plaque, in the artery walls). Lifestyle interventions such as dietary choices and physical activity address the cause of atherosclerosis. A review of the types of fats and their impact on blood cholesterol levels, along with dietary recommendations to reduce fat intake is also included.  Nutrition Action Plan  Clinical staff conducted group or individual video education with verbal and written material and guidebook.  Patient learns how to incorporate Pritikin recommendations into their lifestyle. Recommendations include planning and keeping personal health goals in mind as an important part of their success.  Healthy Mind-Set    Healthy Minds, Bodies, Hearts  Clinical staff conducted group or individual video education with verbal and written material and guidebook.  Patient learns how to identify when they are stressed. Video will discuss the impact of that stress, as well as the many benefits of stress management. Patient will also be introduced to stress management techniques. The way we think, act, and feel has an impact on our hearts.  How Our Thoughts Can Heal Our Hearts  Clinical staff conducted group or individual video education with verbal and written material and guidebook.  Patient learns that negative thoughts can cause depression and anxiety. This can result in negative lifestyle behavior and serious health problems. Cognitive behavioral therapy is an effective method to help control our thoughts in order to change and improve our emotional outlook.  Additional Videos:  Exercise    Improving Performance  Clinical staff conducted group or individual video education with verbal and written material and guidebook.  Patient learns to use a non-linear approach by alternating  intensity levels and lengths of time spent exercising to help burn more calories and lose more body fat. Cardiovascular exercise helps improve heart health, metabolism, hormonal balance, blood sugar control, and recovery from fatigue. Resistance training improves strength, endurance, balance, coordination, reaction time, metabolism, and muscle mass. Flexibility exercise improves circulation, posture, and balance. Seek guidance from your physician and exercise physiologist before implementing an exercise routine and learn your capabilities and proper form for all exercise.  Introduction to Yoga  Clinical staff conducted group or individual video education with verbal and written material and guidebook.  Patient learns about yoga, a discipline of the coming together of mind, breath, and body. The benefits of yoga include improved flexibility, improved range of motion, better posture and core strength, increased lung function, weight loss, and positive self-image. Yoga's heart health benefits include lowered blood pressure, healthier heart rate, decreased cholesterol and  triglyceride levels, improved immune function, and reduced stress. Seek guidance from your physician and exercise physiologist before implementing an exercise routine and learn your capabilities and proper form for all exercise.  Medical   Aging: Enhancing Your Quality of Life  Clinical staff conducted group or individual video education with verbal and written material and guidebook.  Patient learns key strategies and recommendations to stay in good physical health and enhance quality of life, such as prevention strategies, having an advocate, securing a [place], and keeping a list of medications and system for tracking them. It also discusses how to avoid risk for bone loss.  Biology of Weight Control  Clinical staff conducted group or individual video education with verbal and written material and  guidebook.  Patient learns that weight gain occurs because we consume more calories than we burn (eating more, moving less). Even if your body weight is normal, you may have higher ratios of fat compared to muscle mass. Too much body fat puts you at increased risk for cardiovascular disease, heart attack, stroke, type 2 diabetes, and obesity-related cancers. In addition to exercise, following the [place] can help reduce your risk.  Decoding Lab Results  Clinical staff conducted group or individual video education with verbal and written material and guidebook.  Patient learns that lab test reflects one measurement whose values change over time and are influenced by many factors, including medication, stress, sleep, exercise, food, hydration, pre-existing medical conditions, and more. It is recommended to use the knowledge from this video to become more involved with your lab results and evaluate your numbers to speak with your doctor.   Diseases of Our Time - Overview  Clinical staff conducted group or individual video education with verbal and written material and guidebook.  Patient learns that according to the CDC, 50% to 70% of chronic diseases (such as obesity, type 2 diabetes, elevated lipids, hypertension, and heart disease) are avoidable through lifestyle improvements including healthier food choices, listening to satiety cues, and increased physical activity.  Sleep Disorders Clinical staff conducted group or individual video education with verbal and written material and guidebook.  Patient learns how good quality and duration of sleep are important to overall health and well-being. Patient also learns about sleep disorders and how they impact health along with recommendations to address them, including discussing with a physician.  Nutrition  Dining Out - Part 2 Clinical staff conducted group or individual video education with verbal and written material and guidebook.   Patient learns how to plan ahead and communicate in order to maximize their dining experience in a healthy and nutritious manner. Included are recommended food choices based on the type of restaurant the patient is visiting.   Fueling a Best boy conducted group or individual video education with verbal and written material and guidebook.  There is a strong connection between our food choices and our health. Diseases like obesity and type 2 diabetes are very prevalent and are in large-part due to lifestyle choices. The Pritikin Eating Plan provides plenty of food and hunger-curbing satisfaction. It is easy to follow, affordable, and helps reduce health risks.  Menu Workshop  Clinical staff conducted group or individual video education with verbal and written material and guidebook.  Patient learns that restaurant meals can sabotage health goals because they are often packed with calories, fat, sodium, and sugar. Recommendations include strategies to plan ahead and to communicate with the manager, chef, or server  to help order a healthier meal.  Planning Your Eating Strategy  Clinical staff conducted group or individual video education with verbal and written material and guidebook.  Patient learns about the [place] and its benefit of reducing the risk of disease. The [place] does not focus on calories. Instead, it emphasizes high-quality, nutrient-rich foods. By knowing the characteristics of the foods, we choose, we can determine their calorie density and make informed decisions.  Targeting Your Nutrition Priorities  Clinical staff conducted group or individual video education with verbal and written material and guidebook.  Patient learns that lifestyle habits have a tremendous impact on disease risk and progression. This video provides eating and physical activity recommendations based on your personal health goals, such as reducing LDL cholesterol,  losing weight, preventing or controlling type 2 diabetes, and reducing high blood pressure.  Vitamins and Minerals  Clinical staff conducted group or individual video education with verbal and written material and guidebook.  Patient learns different ways to obtain key vitamins and minerals, including through a recommended healthy diet. It is important to discuss all supplements you take with your doctor.   Healthy Mind-Set    Smoking Cessation  Clinical staff conducted group or individual video education with verbal and written material and guidebook.  Patient learns that cigarette smoking and tobacco addiction pose a serious health risk which affects millions of people. Stopping smoking will significantly reduce the risk of heart disease, lung disease, and many forms of cancer. Recommended strategies for quitting are covered, including working with your doctor to develop a successful plan.  Culinary   Becoming a Financial trader conducted group or individual video education with verbal and written material and guidebook.  Patient learns that cooking at home can be healthy, cost-effective, quick, and puts them in control. Keys to cooking healthy recipes will include looking at your recipe, assessing your equipment needs, planning ahead, making it simple, choosing cost-effective seasonal ingredients, and limiting the use of added fats, salts, and sugars.  Cooking - Breakfast and Snacks  Clinical staff conducted group or individual video education with verbal and written material and guidebook.  Patient learns how important breakfast is to satiety and nutrition through the entire day. Recommendations include key foods to eat during breakfast to help stabilize blood sugar levels and to prevent overeating at meals later in the day. Planning ahead is also a key component.  Cooking - Human resources officer conducted group or individual video education with verbal and written  material and guidebook.  Patient learns eating strategies to improve overall health, including an approach to cook more at home. Recommendations include thinking of animal protein as a side on your plate rather than center stage and focusing instead on lower calorie dense options like vegetables, fruits, whole grains, and plant-based proteins, such as beans. Making sauces in large quantities to freeze for later and leaving the skin on your vegetables are also recommended to maximize your experience.  Cooking - Healthy Salads and Dressing Clinical staff conducted group or individual video education with verbal and written material and guidebook.  Patient learns that vegetables, fruits, whole grains, and legumes are the foundations of the [place]. Recommendations include how to incorporate each of these in flavorful and healthy salads, and how to create homemade salad dressings. Proper handling of ingredients is also covered. Cooking - Soups and Desserts  Cooking - Soups and Desserts Clinical staff conducted group or individual video education  with verbal and written material and guidebook.  Patient learns that Pritikin soups and desserts make for easy, nutritious, and delicious snacks and meal components that are low in sodium, fat, sugar, and calorie density, while high in vitamins, minerals, and filling fiber. Recommendations include simple and healthy ideas for soups and desserts.   Overview     The Pritikin Solution Program Overview Clinical staff conducted group or individual video education with verbal and written material and guidebook.  Patient learns that the results of the [place] Program have been documented in more than 100 articles published in peer-reviewed journals, and the benefits include reducing risk factors for (and, in some cases, even reversing) high cholesterol, high blood pressure, type 2 diabetes, obesity, and more! An overview of the three key pillars of the  Pritikin Program will be covered: eating well, doing regular exercise, and having a healthy mind-set.  WORKSHOPS  Exercise: Exercise Basics: Building Your Action Plan Clinical staff led group instruction and group discussion with PowerPoint presentation and patient guidebook. To enhance the learning environment the use of posters, models and videos may be added. At the conclusion of this workshop, patients will comprehend the difference between physical activity and exercise, as well as the benefits of incorporating both, into their routine. Patients will understand the FITT (Frequency, Intensity, Time, and Type) principle and how to use it to build an exercise action plan. In addition, safety concerns and other considerations for exercise and cardiac rehab will be addressed by the presenter. The purpose of this lesson is to promote a comprehensive and effective weekly exercise routine in order to improve patients' overall level of fitness.   Managing Heart Disease: Your Path to a Healthier Heart Clinical staff led group instruction and group discussion with PowerPoint presentation and patient guidebook. To enhance the learning environment the use of posters, models and videos may be added.At the conclusion of this workshop, patients will understand the anatomy and physiology of the heart. Additionally, they will understand how Pritikin's three pillars impact the risk factors, the progression, and the management of heart disease.  The purpose of this lesson is to provide a high-level overview of the heart, heart disease, and how the Pritikin lifestyle positively impacts risk factors.  Exercise Biomechanics Clinical staff led group instruction and group discussion with PowerPoint presentation and patient guidebook. To enhance the learning environment the use of posters, models and videos may be added. Patients will learn how the structural parts of their bodies function and how these functions  impact their daily activities, movement, and exercise. Patients will learn how to promote a neutral spine, learn how to manage pain, and identify ways to improve their physical movement in order to promote healthy living. The purpose of this lesson is to expose patients to common physical limitations that impact physical activity. Participants will learn practical ways to adapt and manage aches and pains, and to minimize their effect on regular exercise. Patients will learn how to maintain good posture while sitting, walking, and lifting.  Balance Training and Fall Prevention  Clinical staff led group instruction and group discussion with PowerPoint presentation and patient guidebook. To enhance the learning environment the use of posters, models and videos may be added. At the conclusion of this workshop, patients will understand the importance of their sensorimotor skills (vision, proprioception, and the vestibular system) in maintaining their ability to balance as they age. Patients will apply a variety of balancing exercises that are appropriate for their current level of function. Patients  will understand the common causes for poor balance, possible solutions to these problems, and ways to modify their physical environment in order to minimize their fall risk. The purpose of this lesson is to teach patients about the importance of maintaining balance as they age and ways to minimize their risk of falling.  WORKSHOPS   Nutrition:  Fueling a Scientist, research (physical sciences) led group instruction and group discussion with PowerPoint presentation and patient guidebook. To enhance the learning environment the use of posters, models and videos may be added. Patients will review the foundational principles of the [place] and understand what constitutes a serving size in each of the food groups. Patients will also learn Pritikin-friendly foods that are better choices when away from home and  review make-ahead meal and snack options. Calorie density will be reviewed and applied to three nutrition priorities: weight maintenance, weight loss, and weight gain. The purpose of this lesson is to reinforce (in a group setting) the key concepts around what patients are recommended to eat and how to apply these guidelines when away from home by planning and selecting Pritikin-friendly options. Patients will understand how calorie density may be adjusted for different weight management goals.  Mindful Eating  Clinical staff led group instruction and group discussion with PowerPoint presentation and patient guidebook. To enhance the learning environment the use of posters, models and videos may be added. Patients will briefly review the concepts of the [place] and the importance of low-calorie dense foods. The concept of mindful eating will be introduced as well as the importance of paying attention to internal hunger signals. Triggers for non-hunger eating and techniques for dealing with triggers will be explored. The purpose of this lesson is to provide patients with the opportunity to review the basic principles of the [place], discuss the value of eating mindfully and how to measure internal cues of hunger and fullness using the Hunger Scale. Patients will also discuss reasons for non-hunger eating and learn strategies to use for controlling emotional eating.  Targeting Your Nutrition Priorities Clinical staff led group instruction and group discussion with PowerPoint presentation and patient guidebook. To enhance the learning environment the use of posters, models and videos may be added. Patients will learn how to determine their genetic susceptibility to disease by reviewing their family history. Patients will gain insight into the importance of diet as part of an overall healthy lifestyle in mitigating the impact of genetics and other environmental insults. The purpose of  this lesson is to provide patients with the opportunity to assess their personal nutrition priorities by looking at their family history, their own health history and current risk factors. Patients will also be able to discuss ways of prioritizing and modifying the [place] for their highest risk areas  Menu  Clinical staff led group instruction and group discussion with PowerPoint presentation and patient guidebook. To enhance the learning environment the use of posters, models and videos may be added. Using menus brought in from [employer], or printed from [employer], patients will apply the [place] dining out guidelines that were presented in the [employer] video. Patients will also be able to practice these guidelines in a variety of provided scenarios. The purpose of this lesson is to provide patients with the opportunity to practice hands-on learning of the [place] with actual menus and practice scenarios.  Label Reading Clinical staff led group instruction and group discussion with PowerPoint presentation  and patient guidebook. To enhance the learning environment the use of posters, models and videos may be added. Patients will review and discuss the [facility] label reading guidelines presented in [employer]'s Label Reading Educational series video. Using fool labels brought in from local grocery stores and markets, patients will apply the label reading guidelines and determine if the packaged food meet the [facility]in guidelines. The purpose of this lesson is to provide patients with the opportunity to review, discuss, and practice hands-on learning of the [facility] Label Reading guidelines with actual packaged food labels. [place] Workshops are designed to teach patients ways to prepare quick, simple, and affordable recipes at home. The importance of nutrition's role in chronic disease risk reduction is  reflected in its emphasis in the overall [facility] program. By learning how to prepare essential core [facility]in Eating Plan recipes, patients will increase control over what they eat; be able to customize the flavor of foods without the use of added salt, sugar, or fat; and improve the quality of the food they consume. By learning a set of core recipes which are easily assembled, quickly prepared, and affordable, patients are more likely to prepare more healthy foods at home. These workshops focus on convenient breakfasts, simple entres, side dishes, and desserts which can be prepared with minimal effort and are consistent with nutrition recommendations for cardiovascular risk reduction. Cooking [employer] are taught by a Engineer, materials (RD) who has been trained by the [employer]. The chef or RD has a clear understanding of the importance of minimizing - if not completely eliminating - added fat, sugar, and sodium in recipes. Throughout the series of [place] Workshop sessions, patients will learn about healthy ingredients and efficient methods of cooking to build confidence in their capability to prepare    Cooking School weekly topics:  Adding Flavor- Sodium-Free  Fast and Healthy Breakfasts  Powerhouse Plant-Based Proteins  Satisfying Salads and Dressings  Simple Sides and Sauces  International Cuisine-Spotlight on the [place] Zones  Delicious Desserts  Savory Soups  Efficiency Cooking - Meals in a Snap  Tasty Appetizers and Snacks  Comforting Weekend Breakfasts  One-Pot Wonders   Fast Evening Meals  Easy [place] (Psychosocial): New Thoughts, New Behaviors Clinical staff led group instruction and group discussion with PowerPoint presentation and patient guidebook. To enhance the learning environment the use of posters, models and videos may be added. Patients will learn and practice  techniques for developing effective health and lifestyle goals. Patients will be able to effectively apply the goal setting process learned to develop at least one new personal goal.  The purpose of this lesson is to expose patients to a new skill set of behavior modification techniques such as techniques setting SMART goals, overcoming barriers, and achieving new thoughts and new behaviors.  Managing Moods and Relationships Clinical staff led group instruction and group discussion with PowerPoint presentation and patient guidebook. To enhance the learning environment the use of posters, models and videos may be added. Patients will learn how emotional and chronic stress factors can impact their health and relationships. They will learn healthy ways to manage their moods and utilize positive coping mechanisms. In addition, ICR patients will learn ways to improve communication skills. The purpose of this lesson is to expose patients to ways of understanding how one's mood and health are intimately connected. Developing a healthy outlook can help build positive relationships  and connections with others. Patients will understand the importance of utilizing effective communication skills that include actively listening and being heard. They will learn and understand the importance of the "4 Cs" and especially Connections in fostering of a Healthy Mind-Set.  Healthy Sleep for a Healthy Heart Clinical staff led group instruction and group discussion with PowerPoint presentation and patient guidebook. To enhance the learning environment the use of posters, models and videos may be added. At the conclusion of this workshop, patients will be able to demonstrate knowledge of the importance of sleep to overall health, well-being, and quality of life. They will understand the symptoms of, and treatments for, common sleep disorders. Patients will also be able to identify daytime and nighttime behaviors which impact sleep,  and they will be able to apply these tools to help manage sleep-related challenges. The purpose of this lesson is to provide patients with a general overview of sleep and outline the importance of quality sleep. Patients will learn about a few of the most common sleep disorders. Patients will also be introduced to the concept of "sleep hygiene," and discover ways to self-manage certain sleeping problems through simple daily behavior changes. Finally, the workshop will motivate patients by clarifying the links between quality sleep and their goals of heart-healthy living.   Recognizing and Reducing Stress Clinical staff led group instruction and group discussion with PowerPoint presentation and patient guidebook. To enhance the learning environment the use of posters, models and videos may be added. At the conclusion of this workshop, patients will be able to understand the types of stress reactions, differentiate between acute and chronic stress, and recognize the impact that chronic stress has on their health. They will also be able to apply different coping mechanisms, such as reframing negative self-talk. Patients will have the opportunity to practice a variety of stress management techniques, such as deep abdominal breathing, progressive muscle relaxation, and/or guided imagery.  The purpose of this lesson is to educate patients on the role of stress in their lives and to provide healthy techniques for coping with it.  Learning Barriers/Preferences:  Learning Barriers/Preferences - 11/21/21 1341       Learning Barriers/Preferences   Learning Barriers Sight;Hearing   Wears glasses and is hard of hearing.   Learning Preferences Audio             Education Topics:  Knowledge Questionnaire Score:  Knowledge Questionnaire Score - 11/21/21 1215       Knowledge Questionnaire Score   Pre Score 21/24             Core Components/Risk Factors/Patient Goals at Admission:  Personal Goals  and Risk Factors at Admission - 11/21/21 1106       Core Components/Risk Factors/Patient Goals on Admission    Weight Management Yes;Obesity;Weight Loss    Intervention Weight Management/Obesity: Establish reasonable short term and long term weight goals.;Obesity: Provide education and appropriate resources to help participant work on and attain dietary goals.    Admit Weight 305 lb 8.9 oz (138.6 kg)    Expected Outcomes Short Term: Continue to assess and modify interventions until short term weight is achieved;Long Term: Adherence to nutrition and physical activity/exercise program aimed toward attainment of established weight goal;Weight Loss: Understanding of general recommendations for a balanced deficit meal plan, which promotes 1-2 lb weight loss per week and includes a negative energy balance of [phone] kcal/d    Improve shortness of breath with ADL's Yes    Intervention Provide education, individualized exercise  plan and daily activity instruction to help decrease symptoms of SOB with activities of daily living.    Expected Outcomes Short Term: Improve cardiorespiratory fitness to achieve a reduction of symptoms when performing ADLs;Long Term: Be able to perform more ADLs without symptoms or delay the onset of symptoms    Diabetes Yes    Intervention Provide education about signs/symptoms and action to take for hypo/hyperglycemia.;Provide education about proper nutrition, including hydration, and aerobic/resistive exercise prescription along with prescribed medications to achieve blood glucose in normal ranges: Fasting glucose 65-99 mg/dL    Expected Outcomes Short Term: Participant verbalizes understanding of the signs/symptoms and immediate care of hyper/hypoglycemia, proper foot care and importance of medication, aerobic/resistive exercise and nutrition plan for blood glucose control.;Long Term: Attainment of HbA1C < 7%.    Hypertension Yes    Intervention Provide education on lifestyle  modifcations including regular physical activity/exercise, weight management, moderate sodium restriction and increased consumption of fresh fruit, vegetables, and low fat dairy, alcohol moderation, and smoking cessation.;Monitor prescription use compliance.    Expected Outcomes Short Term: Continued assessment and intervention until BP is < 140/67m HG in hypertensive participants. < 130/856mHG in hypertensive participants with diabetes, heart failure or chronic kidney disease.;Long Term: Maintenance of blood pressure at goal levels.    Lipids Yes    Intervention Provide education and support for participant on nutrition & aerobic/resistive exercise along with prescribed medications to achieve LDL <7044mHDL >75m80m  Expected Outcomes Short Term: Participant states understanding of desired cholesterol values and is compliant with medications prescribed. Participant is following exercise prescription and nutrition guidelines.;Long Term: Cholesterol controlled with medications as prescribed, with individualized exercise RX and with personalized nutrition plan. Value goals: LDL < 70mg41mL > 40 mg.    Stress Yes    Intervention Offer individual and/or small group education and counseling on adjustment to heart disease, stress management and health-related lifestyle change. Teach and support self-help strategies.;Refer participants experiencing significant psychosocial distress to appropriate mental health specialists for further evaluation and treatment. When possible, include family members and significant others in education/counseling sessions.    Expected Outcomes Short Term: Participant demonstrates changes in health-related behavior, relaxation and other stress management skills, ability to obtain effective social support, and compliance with psychotropic medications if prescribed.;Long Term: Emotional wellbeing is indicated by absence of clinically significant psychosocial distress or social isolation.              Core Components/Risk Factors/Patient Goals Review:   Goals and Risk Factor Review     Row Name 11/29/21 1402 12/14/21 1500 01/10/22 1732         Core Components/Risk Factors/Patient Goals Review   Personal Goals Review Weight Management/Obesity;Hypertension;Lipids;Diabetes;Stress Weight Management/Obesity;Hypertension;Lipids;Diabetes;Stress Weight Management/Obesity;Hypertension;Lipids;Diabetes;Stress     Review MarshLetcherted cardiac rehab on 11/27/21. MarshTriptonwell with exercise. Vital signs and CBG's were stable. MarshBlairbeen doing well with exercise. Vital signs  have been stableand CBG's have been variable MarshMalickbeen doing well with exercise. Vital signs  have been stable and CBG's have been variable. CBG's were forwarded to MarshPeshtigoary care provider Dr RichtDarron DoomExpected Outcomes MarshIlay continue to participate in phase 2 cardiac rehab for exercise, nutrition and lifestyle modifications [place] continue to participate in phase 2 cardiac rehab for exercise, nutrition and lifestyle modifications [place] continue to participate in phase 2 cardiac rehab for exercise, nutrition and lifestyle modifications  Core Components/Risk Factors/Patient Goals at Discharge (Final Review):   Goals and Risk Factor Review - 01/10/22 1732       Core Components/Risk Factors/Patient Goals Review   Personal Goals Review Weight Management/Obesity;Hypertension;Lipids;Diabetes;Stress    Review Romel has been doing well with exercise. Vital signs  have been stable and CBG's have been variable. CBG's were forwarded to [place] primary care provider Dr Darron Doom    Expected Outcomes Merit will continue to participate in phase 2 cardiac rehab for exercise, nutrition and lifestyle modifications             ITP Comments:  ITP Comments     Row Name 11/21/21 1017 11/29/21 0845 12/14/21 1456 01/10/22 1730     ITP Comments  Medical Director- Dr. Fransico Him, MD. Introduction to [facility] program provided at cardiac rehab orientation. Reviewed initial resource packet with the patient. Patient will review packet at home. Question's answered. 30 Day ITP Review. Jacorie started cardiac rehab on 11/27/21 and did well with exercise. 30 Day ITP Review. Nahuel is off to a good start to exercise at intensive cardiac rehab. Beaux is doing well with exercise. 30 Day ITP Review. Giang has good participation when in attendance at intensive cardiac rehab. Wane will complete cardiac rehab on 01/19/22             Comments: See ITP comments.Harrell Gave RN BSN

## 2022-01-11 ENCOUNTER — Encounter: Payer: Self-pay | Admitting: Internal Medicine

## 2022-01-12 ENCOUNTER — Encounter (HOSPITAL_COMMUNITY)
Admission: RE | Admit: 2022-01-12 | Discharge: 2022-01-12 | Disposition: A | Discharge status: Home / Self Care | Payer: Medicare Other | Source: Ambulatory Visit | Attending: Internal Medicine | Admitting: Internal Medicine

## 2022-01-12 DIAGNOSIS — Z955 Presence of coronary angioplasty implant and graft: Secondary | ICD-10-CM

## 2022-01-12 NOTE — Telephone Encounter (Signed)
Dr. Silas Flood please advise on the following My Chart message as Dr. Chase Caller and Eustaquio Maize are both unavailable:    Hi Doc, I would like to get a new CT scan of lungs to evaluate prior clot and possible scarring.  I am continuing to be short of breath with very little exertion.    Please schedule and let me know earliest time. Thanks, [name]  Thank you

## 2022-01-12 NOTE — Telephone Encounter (Signed)
Recommend she be seen by Derl Barrow, NP next week to discuss symptoms and decide what tests should be ordered.

## 2022-01-12 NOTE — Telephone Encounter (Signed)
Correction: Recommend he be seen by Derl Barrow, NP next week to discuss symptoms and decide what tests should be ordered.

## 2022-01-15 ENCOUNTER — Telehealth (HOSPITAL_COMMUNITY): Payer: Self-pay | Admitting: Family Medicine

## 2022-01-15 ENCOUNTER — Encounter (HOSPITAL_COMMUNITY): Payer: Medicare Other

## 2022-01-17 ENCOUNTER — Telehealth (HOSPITAL_COMMUNITY): Payer: Self-pay | Admitting: Family Medicine

## 2022-01-17 ENCOUNTER — Encounter (HOSPITAL_COMMUNITY): Payer: Medicare Other

## 2022-01-19 ENCOUNTER — Telehealth (HOSPITAL_COMMUNITY): Payer: Self-pay | Admitting: *Deleted

## 2022-01-19 ENCOUNTER — Encounter (HOSPITAL_COMMUNITY): Payer: Medicare Other

## 2022-01-19 NOTE — Telephone Encounter (Signed)
[name] called to report that he will not be returning to complete his last day of exercise at intensive cardiac rehab. [name] plans to continue exercise at either the [employer] or [employer].[name], RN,BSN 01/19/2022 1:43 PM

## 2022-01-19 NOTE — Telephone Encounter (Signed)
Patient called to say he has another appointment and will not be able to make cardiac rehab today. Call forwarded to the nurse case manager.

## 2022-01-19 NOTE — Progress Notes (Signed)
Discharge Progress Report  Patient Details  Name: [name] MRN: [id number] Date of Birth: 10/22/1955 Referring Provider:   Flowsheet Row INTENSIVE CARDIAC REHAB ORIENT from 11/21/2021 in [facility]  Referring Provider Werner Lean, MD        Number of Visits: 18  Reason for Discharge:  Patient reached a stable level of exercise.  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  ADL [facility]:   Initial Exercise Prescription:  Initial Exercise Prescription - 11/21/21 1200       Date of Initial Exercise RX and Referring Provider   Date 11/21/21    Referring Provider Werner Lean, MD    Expected Discharge Date 01/19/22      Arm Ergometer   Level 2    Watts 35    Minutes 15    METs 2.32      T5 Nustep   Level 2    SPM 85    Minutes 15    METs 2      Prescription Details   Frequency (times per week) 3    Duration Progress to 30 minutes of continuous aerobic without signs/symptoms of physical distress      Intensity   THRR 40-80% of Max Heartrate 66-123    Ratings of Perceived Exertion 11-13    Perceived Dyspnea 0-4      Progression   Progression Continue to progress workloads to maintain intensity without signs/symptoms of physical distress.      Resistance Training   Training Prescription Yes    Weight 3 lbs    Reps 10-15             Discharge Exercise Prescription (Final Exercise Prescription Changes):  Exercise Prescription Changes - 01/12/22 1624       Response to Exercise   Blood Pressure (Admit) 104/64    Blood Pressure (Exercise) 110/68    Blood Pressure (Exit) 110/74    Heart Rate (Admit) 68 bpm    Heart Rate (Exercise) 82 bpm    Heart  Rate (Exit) 73 bpm    Rating of Perceived Exertion (Exercise) 12    Perceived Dyspnea (Exercise) 0    Symptoms None    Comments Reviewed mET's and goals    Duration Continue with 30 min of aerobic exercise without signs/symptoms of physical distress.    Intensity THRR unchanged      Progression   Progression Continue to progress workloads to maintain intensity without signs/symptoms of physical distress.    Average METs 1.8      Resistance Training   Training Prescription Yes    Weight 4 lbs    Reps 10-15    Time 10 Minutes      Interval Training   Interval Training No      Arm Ergometer   Level 3    Minutes 15    METs 1.6      T5 Nustep   Level 5    SPM 64    Minutes 15    METs 2      Home Exercise Plan   Plans to continue exercise at Home (comment)    Frequency Add 2 additional  days to program exercise sessions.    Initial Home Exercises Provided 12/18/21             Functional Capacity:  6 Minute Walk     Row Name 11/21/21 1141         6 Minute Walk   Phase Initial     Distance 1140 feet     Walk Time 6 minutes     # of Rest Breaks 0     MPH 2.16     METS 1.74     RPE 14     Perceived Dyspnea  2.5     VO2 Peak 6.11     Symptoms Yes (comment)     Comments Participant c/o bilateral thigh fatigue and mild shortness of breath, RPD=2.5.     Resting HR 60 bpm     Resting BP 110/70     Resting Oxygen Saturation  95 %     Exercise Oxygen Saturation  during 6 min walk 96 %     Max Ex. HR 93 bpm     Max Ex. BP 120/52     2 Minute Post BP 100/58              Psychological, QOL, Others - Outcomes: PHQ 2/9:    11/21/2021    1:45 PM 11/10/2021    9:51 AM 08/15/2018    9:36 AM 04/30/2018   10:40 AM 10/01/2017    4:48 PM  Depression screen PHQ 2/9  Decreased Interest 0 2 0 0 0  Down, Depressed, Hopeless 0 1 0 0 0  PHQ - 2 Score 0 3 0 0 0  Altered sleeping  2     Tired, decreased energy  3     Change in appetite  1     Feeling bad or failure  about yourself   0     Trouble concentrating  0     Moving slowly or fidgety/restless  1     Suicidal thoughts  0     PHQ-9 Score  10       Quality of Life:  Quality of Life - 11/21/21 1214       Quality of Life   Select Quality of Life      Quality of Life Scores   Health/Function Pre 19.23 %    Socioeconomic Pre 21 %    Psych/Spiritual Pre 20.64 %    Family Pre 21.4 %    GLOBAL Pre 20.21 %             Personal Goals: Goals established at orientation with interventions provided to work toward goal.  Personal Goals and Risk Factors at Admission - 11/21/21 1106       Core Components/Risk Factors/Patient Goals on Admission    Weight Management Yes;Obesity;Weight Loss    Intervention Weight Management/Obesity: Establish reasonable short term and long term weight goals.;Obesity: Provide education and appropriate resources to help participant work on and attain dietary goals.    Admit Weight 305 lb 8.9 oz (138.6 kg)    Expected Outcomes Short Term: Continue to assess and modify interventions until short term weight is achieved;Long Term: Adherence to nutrition and physical activity/exercise program aimed toward attainment of established weight goal;Weight Loss: Understanding of general recommendations for a balanced deficit meal plan, which promotes 1-2 lb weight loss per week and includes a negative energy balance of [phone] kcal/d    Improve shortness of breath with ADL's Yes    Intervention Provide  education, individualized exercise plan and daily activity instruction to help decrease symptoms of SOB with activities of daily living.    Expected Outcomes Short Term: Improve cardiorespiratory fitness to achieve a reduction of symptoms when performing ADLs;Long Term: Be able to perform more ADLs without symptoms or delay the onset of symptoms    Diabetes Yes    Intervention Provide education about signs/symptoms and action to take for hypo/hyperglycemia.;Provide education about  proper nutrition, including hydration, and aerobic/resistive exercise prescription along with prescribed medications to achieve blood glucose in normal ranges: Fasting glucose 65-99 mg/dL    Expected Outcomes Short Term: Participant verbalizes understanding of the signs/symptoms and immediate care of hyper/hypoglycemia, proper foot care and importance of medication, aerobic/resistive exercise and nutrition plan for blood glucose control.;Long Term: Attainment of HbA1C < 7%.    Hypertension Yes    Intervention Provide education on lifestyle modifcations including regular physical activity/exercise, weight management, moderate sodium restriction and increased consumption of fresh fruit, vegetables, and low fat dairy, alcohol moderation, and smoking cessation.;Monitor prescription use compliance.    Expected Outcomes Short Term: Continued assessment and intervention until BP is < 140/43m HG in hypertensive participants. < 130/879mHG in hypertensive participants with diabetes, heart failure or chronic kidney disease.;Long Term: Maintenance of blood pressure at goal levels.    Lipids Yes    Intervention Provide education and support for participant on nutrition & aerobic/resistive exercise along with prescribed medications to achieve LDL <7017mHDL >40m65m  Expected Outcomes Short Term: Participant states understanding of desired cholesterol values and is compliant with medications prescribed. Participant is following exercise prescription and nutrition guidelines.;Long Term: Cholesterol controlled with medications as prescribed, with individualized exercise RX and with personalized nutrition plan. Value goals: LDL < 70mg34mL > 40 mg.    Stress Yes    Intervention Offer individual and/or small group education and counseling on adjustment to heart disease, stress management and health-related lifestyle change. Teach and support self-help strategies.;Refer participants experiencing significant psychosocial  distress to appropriate mental health specialists for further evaluation and treatment. When possible, include family members and significant others in education/counseling sessions.    Expected Outcomes Short Term: Participant demonstrates changes in health-related behavior, relaxation and other stress management skills, ability to obtain effective social support, and compliance with psychotropic medications if prescribed.;Long Term: Emotional wellbeing is indicated by absence of clinically significant psychosocial distress or social isolation.              Personal Goals Discharge:  Goals and Risk Factor Review     Row Name 11/29/21 1402 12/14/21 1500 01/10/22 1732         Core Components/Risk Factors/Patient Goals Review   Personal Goals Review Weight Management/Obesity;Hypertension;Lipids;Diabetes;Stress Weight Management/Obesity;Hypertension;Lipids;Diabetes;Stress Weight Management/Obesity;Hypertension;Lipids;Diabetes;Stress     Review MarshThomted cardiac rehab on 11/27/21. MarshNiccolaswell with exercise. Vital signs and CBG's were stable. MarshZanderbeen doing well with exercise. Vital signs  have been stableand CBG's have been variable MarshIdobeen doing well with exercise. Vital signs  have been stable and CBG's have been variable. CBG's were forwarded to MarshWibauxary care provider Dr RichtDarron DoomExpected Outcomes MarshCastin continue to participate in phase 2 cardiac rehab for exercise, nutrition and lifestyle modifications MarshYogesh continue to participate in phase 2 cardiac rehab for exercise, nutrition and lifestyle modifications MarshDywane continue to participate in phase 2 cardiac rehab for exercise, nutrition and lifestyle modifications  Exercise Goals and Review:  Exercise Goals     Row Name 11/21/21 1115             Exercise Goals   Increase Physical Activity Yes       Intervention Provide advice, education, support and  counseling about physical activity/exercise needs.;Develop an individualized exercise prescription for aerobic and resistive training based on initial evaluation findings, risk stratification, comorbidities and participant's personal goals.       Expected Outcomes Short Term: Attend rehab on a regular basis to increase amount of physical activity.;Long Term: Exercising regularly at least 3-5 days a week.;Long Term: Add in home exercise to make exercise part of routine and to increase amount of physical activity.       Increase Strength and Stamina Yes       Intervention Provide advice, education, support and counseling about physical activity/exercise needs.;Develop an individualized exercise prescription for aerobic and resistive training based on initial evaluation findings, risk stratification, comorbidities and participant's personal goals.       Expected Outcomes Short Term: Increase workloads from initial exercise prescription for resistance, speed, and METs.;Short Term: Perform resistance training exercises routinely during rehab and add in resistance training at home;Long Term: Improve cardiorespiratory fitness, muscular endurance and strength as measured by increased METs and functional capacity (6MWT)       Able to understand and use rate of perceived exertion (RPE) scale Yes       Intervention Provide education and explanation on how to use RPE scale       Expected Outcomes Short Term: Able to use RPE daily in rehab to express subjective intensity level;Long Term:  Able to use RPE to guide intensity level when exercising independently       Knowledge and understanding of Target Heart Rate Range (THRR) Yes       Intervention Provide education and explanation of THRR including how the numbers were predicted and where they are located for reference       Expected Outcomes Short Term: Able to state/look up THRR;Long Term: Able to use THRR to govern intensity when exercising independently;Short Term:  Able to use daily as guideline for intensity in rehab       Able to check pulse independently Yes       Intervention Provide education and demonstration on how to check pulse in carotid and radial arteries.;Review the importance of being able to check your own pulse for safety during independent exercise       Expected Outcomes Short Term: Able to explain why pulse checking is important during independent exercise;Long Term: Able to check pulse independently and accurately       Understanding of Exercise Prescription Yes       Intervention Provide education, explanation, and written materials on patient's individual exercise prescription       Expected Outcomes Short Term: Able to explain program exercise prescription;Long Term: Able to explain home exercise prescription to exercise independently                Exercise Goals Re-Evaluation:  Exercise Goals Re-Evaluation     Row Name 11/27/21 1558 12/18/21 1614 01/12/22 1626         Exercise Goal Re-Evaluation   Exercise Goals Review Increase Physical Activity;Able to understand and use rate of perceived exertion (RPE) scale Increase Physical Activity;Able to understand and use rate of perceived exertion (RPE) scale;Increase Strength and Stamina;Knowledge and understanding of Target Heart Rate Range (THRR);Understanding of Exercise Prescription Increase Physical Activity;Able  to understand and use rate of perceived exertion (RPE) scale;Increase Strength and Stamina;Knowledge and understanding of Target Heart Rate Range (THRR);Understanding of Exercise Prescription     Comments Patient able to understand and use RPE scale appropriately. Reviewed METs, goals, and home exercise. Patient tolerated exercise well with an average MET level of 1.9. Patient will continue to exercise on his own by using his recumbent elliptical stepper and walking 1-2 days for 30-45 minutes per session. Patient feels like he is gaining strength over time. Will continue  to work on control of SOB. Reviewed MET's and goals. Patient tolerated exercise well with an average MET level of 1.8. Pt feels like he has gaining strength, but still has continued SOB and toe ulcer pain. Pt is working on a followup appt with pulmonary to address his concerns since he will be traveling soon and feels he will become SOB with travel stress.     Expected Outcomes Progress workloads as tolerated to increase strength and stamina and decrease shortness of breath. Patient will add in 1-2 days of exercise on his own. Will continue to monitor patient and progress as tolerated without s/s. Will continue to monitor pt and progress workloads as tolerated without sigh or symptom              Nutrition & Weight - Outcomes:  Pre Biometrics - 11/21/21 1017       Pre Biometrics   Waist Circumference 53.5 inches    Hip Circumference 52 inches    Waist to Hip Ratio 1.03 %    Triceps Skinfold 22 mm    % Body Fat 40.9 %    Grip Strength 34 kg    Flexibility 0 in    Single Leg Stand 2.06 seconds              Nutrition:  Nutrition Therapy & Goals - 12/29/21 0829       Nutrition Therapy   Diet Heart Healthy/ Carbohydrate Consistent      Personal Nutrition Goals   Nutrition Goal Patient to choose a variety of lean protein/plant protein, fruits, vegetables, whole grains, and non fat dairy daily as part of a heart healthy diet.    Personal Goal #2 Patient to reduce daily alcohol intake to <2 5oz glasses of wine.    Personal Goal #3 Patient to limit sodium intake <1549m per day    Comments Met with patient and his wife one on one to discuss weight loss, blood sugar control, alcohol intake, and heart health. Patient remains precontemplative of lifestyle changes.      Intervention Plan   Intervention Prescribe, educate and counsel regarding individualized specific dietary modifications aiming towards targeted core components such as weight, hypertension, lipid management, diabetes,  heart failure and other comorbidities.;Nutrition handout(s) given to patient.    Expected Outcomes Short Term Goal: Understand basic principles of dietary content, such as calories, fat, sodium, cholesterol and nutrients.;Long Term Goal: Adherence to prescribed nutrition plan.;Short Term Goal: A plan has been developed with personal nutrition goals set during dietitian appointment.             Nutrition Discharge:  Nutrition Assessments - 11/27/21 1536       Rate Your Plate Scores   Pre Score 56             Education Questionnaire Score:  Knowledge Questionnaire Score - 11/21/21 1215       Knowledge Questionnaire Score   Pre Score 21/24  Pt graduates from  Intensive/Traditional cardiac rehab program today with completion of  18 exercise and education sessions. Pt maintained good attendance and progressed nicely during their participation in rehab  Pt plans to continue exercise at either the [facility] or [facility]. Steward did not return to intensive cardiac rehab for his last week of exercise for his repeat post exercise walk test.Dearra Myhand Miguel Rota RN BSN

## 2022-01-22 ENCOUNTER — Telehealth: Payer: Self-pay

## 2022-01-22 NOTE — Telephone Encounter (Signed)
Refill request for 90 day supply of Trelegy.  [name]

## 2022-01-23 NOTE — Telephone Encounter (Signed)
Sent, send to PCP

## 2022-01-24 ENCOUNTER — Encounter: Payer: Self-pay | Admitting: Primary Care

## 2022-01-24 ENCOUNTER — Ambulatory Visit (INDEPENDENT_AMBULATORY_CARE_PROVIDER_SITE_OTHER): Payer: Medicare Other | Admitting: Primary Care

## 2022-01-24 VITALS — BP 98/58 | HR 68 | Ht 70.0 in | Wt 304.2 lb

## 2022-01-24 DIAGNOSIS — R0609 Other forms of dyspnea: Secondary | ICD-10-CM

## 2022-01-24 DIAGNOSIS — Z86711 Personal history of pulmonary embolism: Secondary | ICD-10-CM

## 2022-01-24 DIAGNOSIS — U099 Post covid-19 condition, unspecified: Secondary | ICD-10-CM

## 2022-01-24 DIAGNOSIS — R0602 Shortness of breath: Secondary | ICD-10-CM

## 2022-01-24 LAB — D-DIMER, QUANTITATIVE: D-Dimer, Quant: <0.19 mcg/mL FEU (ref <0.50)

## 2022-01-24 NOTE — Assessment & Plan Note (Addendum)
-   VQ scan in Feb 2023 showed no perfusion defect in either lung  - Continue anticoagulation with Xarelto '10mg'$  daily  - D-dimer in June trending down and within normal range

## 2022-01-24 NOTE — Assessment & Plan Note (Signed)
-   Chronic dyspnea symptoms, unchanged. No better after completing cardiopulmonary rehab. Checking d-dimer, cbc, bmet and BNP today. Recommend getting updated CT  Imaging of his chest. He is scheduled for echocardiogram in September. May want to consider getting cardiopulmonary exercise stress trest. Continue Trelegy 177mg one puff daily.

## 2022-01-24 NOTE — Patient Instructions (Addendum)
-   Depending on D-dimer results will determine what CT chest we order to look at your lungs - I have also order  basic cbc/bmet labs and bnp (fluid level which can indicate heart failure if elevated)  - I do recommend getting echocardiogram in September as ordered, we can try and move this up sooner - We may want to look into getting cardiopulmonary exercise stress test  - Continue Trelegy inhaler as prescribed - Continue CPAP every night for 4-6 hours or longer  - I will ask around for GP recommendations   Follow-up: 2-3 months with Dr. Chase Caller - 15 min visit ok (please put in recall)

## 2022-01-24 NOTE — Assessment & Plan Note (Addendum)
-   Complains of increased thirst, dehydration and muscle fatigue/pain in biceps  - Continue Jardiance '10mg'$  daily - Patient is looking for new GP - Checking BMET

## 2022-01-24 NOTE — Progress Notes (Signed)
$'@Patient'a$  ID: [name], male    DOB: 07-Mar-1956, 66 y.o.   MRN: [id number]  Chief Complaint  Patient presents with   Follow-up    Pt f/u trying to get a CT, he is still SOB and having arm soreness also having increased thirst.      Referring provider: [name], MD  HPI: 66 year old male.  Past medical history significant for OSA, elevated diaphragm, paraseptal emphysema, hypertension, heart failure with preserved ejection fraction, coronary artery disease, type 2 diabetes, obesity, hyperlipidemia.  01/24/2022 Patient presents today for acute overview due to shortness of breath.  Patient notified our office last week of shortness of breath.  Requesting CT scan of lungs to monitor scarring. Shortness of breath has been an ongoing chronic issue since he had covid in November 2019. He feels very short winded when walking. He complains of associated bicep fatigue and increased thirst/dehydration. His breathing symptoms are not acutely worse. He recently completed cardiac rehab but saw no improvement in dyspnea symptoms after finishing. He also had cardiac catheterization with stent placement on Jan 31st, 2023 and saw no improvement in dyspnea symptoms after. Patient's been maintained on Xarelto 10 mg daily and Trelegy.   Last echocardiogram in August 2022 showed normal ejection fraction 60 to 65%.  Grade 1 diastolic dysfunction, moderate left ventricular hypertrophy.    Allergies  Allergen Reactions   Farxiga [Dapagliflozin] Diarrhea and Itching   Erythromycin Nausea Only   Penicillins Nausea Only    Upset stomach    Immunization History  Administered Date(s) Administered   Influenza, High Dose Seasonal PF 04/30/2018   Influenza,inj,Quad PF,6+ Mos 06/30/2016, 03/14/2017   Moderna Sars-Covid-2 Vaccination 10/14/2020   PFIZER(Purple Top)SARS-COV-2 Vaccination 09/10/2019, 10/05/2019   Pneumococcal Polysaccharide-23 10/09/2012   Tdap 02/09/2015    Past Medical History:   Diagnosis Date   Abdominal aortic aneurysm (AAA), 30-34 mm diameter (HCC) 06/01/2017   Nml w/ greatest dm 3 cm US Aorta 06/2016, Abd [place] 05/01/2017 showed infrarenal dilatation at 3.4 cm - rec repeat US in 3 yrs.   Arthritis    Cervical disc disease 12/26/2011   Coronary artery disease involving native coronary artery of native heart without angina pectoris 12/01/2016   He had coronary angiography in 2011. There was irregularity within the LAD with up to 30% narrowing. The myocardial perfusion imaging done 11/07/2015 did not demonstrate any evidence of ischemia with a low risk nuclear stress test other than EF estimated at 47%. Chest CT 01/25/2016 showed diffuse coronary artery calcifications with heavy calcifications in the LAD. Pt seen by cardiology Dr. Linard Millers    Diabetes mellitus    Difficult intubation 04/05/2014   Dyslipidemia    Family history of anesthesia complication    pt states took days for his father to awaken after mask was used    Fatty liver 06/01/2017   [place] 05/01/2017   GERD (gastroesophageal reflux disease)    has been having acid reflux since recent endoscopy    H/O hiatal hernia    repair with lap band, now has ventral hernia midline abd   Hearing loss-aides 03/26/2012   Hyperlipidemia    Hypertension    Morbid obesity ([place])    OSA (obstructive sleep apnea)    Rotator cuff tear    Seasonal allergies    Shortness of breath    pt states related to high BP meds with extended walking or climbling stairs   Sleep apnea     Tobacco History: Social History  Tobacco Use  Smoking Status Former   Packs/day: 4.00   Years: 20.00   Total pack years: 80.00   Types: Cigarettes   Start date: 07/19/1973   Quit date: 12/13/1991   Years since quitting: 30.1  Smokeless Tobacco Never   Counseling given: Not Answered   Outpatient Medications Prior to Visit  Medication Sig Dispense Refill   acetaminophen (TYLENOL) 500 MG tablet Take 500 mg by mouth every 6 (six) hours as  needed (for pain.).     clopidogrel (PLAVIX) 75 MG tablet Take 1 tablet (75 mg total) by mouth daily with breakfast. 90 tablet 3   diltiazem (CARDIZEM CD) 300 MG 24 hr capsule Take 300 mg by mouth in the morning.     diphenhydrAMINE (BENADRYL) 25 MG tablet Take 25 mg by mouth every 6 (six) hours as needed for itching.     empagliflozin (JARDIANCE) 10 MG TABS tablet Take 1 tablet (10 mg total) by mouth daily before breakfast. 90 tablet 3   fluticasone (FLONASE) 50 MCG/ACT nasal spray Place into the nose as needed.     Fluticasone-Umeclidin-Vilant (TRELEGY ELLIPTA) 100-62.5-25 MCG/ACT AEPB Inhale 100 Units into the lungs daily. 1 each 5   glucose blood (TRUETEST TEST) test strip Use as instructed 100 each 2   insulin degludec (TRESIBA FLEXTOUCH) 200 UNIT/ML FlexTouch Pen Inject 114 Units into the skin in the morning.     Insulin Pen Needle (B-D UF III MINI PEN NEEDLES) 31G X 5 MM MISC USE AS DIRECTED ONCE A DAY 100 each 11   lisinopril (ZESTRIL) 20 MG tablet Take 1 tablet (20 mg total) by mouth daily. 90 tablet 3   metoprolol succinate (TOPROL XL) 25 MG 24 hr tablet Take 1 tablet (25 mg total) by mouth daily. 90 tablet 3   Multiple Vitamin (MULTIVITAMIN WITH MINERALS) TABS tablet Take 1 tablet by mouth in the morning.     Potassium Chloride ER 20 MEQ TBCR Take 60 mEq by mouth daily. (Patient taking differently: Take 60 mEq by mouth in the morning.) 180 tablet 3   rivaroxaban (XARELTO) 10 MG TABS tablet Take 1 tablet (10 mg total) by mouth daily. 30 tablet 3   rosuvastatin (CRESTOR) 40 MG tablet Take 1 tablet (40 mg total) by mouth daily. 90 tablet 3   tamsulosin (FLOMAX) 0.4 MG CAPS capsule Take 1 capsule (0.4 mg total) by mouth daily after supper. 90 capsule 0   torsemide (DEMADEX) 20 MG tablet Take 3 tablets (60 mg total) by mouth daily. 270 tablet 3   albuterol (VENTOLIN HFA) 108 (90 Base) MCG/ACT inhaler Inhale 2 puffs into the lungs every 6 (six) hours as needed for wheezing or shortness of  breath (take as needed for SOB/wheezing). (Patient not taking: Reported on 11/21/2021) 8 g 5   No facility-administered medications prior to visit.   Review of Systems  Review of Systems  Constitutional:  Positive for fatigue.  Respiratory:  Positive for shortness of breath.   Cardiovascular:  Positive for leg swelling.  Musculoskeletal:  Positive for myalgias.    Physical Exam  BP (!) 98/58 (BP Location: Right Arm, Patient Position: Sitting, Cuff Size: Large)   Pulse 68   Ht '5\' 10"'$  (1.778 m)   Wt (!) 304 lb 3.2 oz (138 kg)   SpO2 95%   BMI 43.65 kg/m  Physical Exam Constitutional:      General: He is not in acute distress.    Appearance: Normal appearance. He is obese. He is not ill-appearing.  HENT:     Head: Normocephalic and atraumatic.     Mouth/Throat:     Mouth: Mucous membranes are moist.     Pharynx: Oropharynx is clear.  Cardiovascular:     Rate and Rhythm: Normal rate and regular rhythm.  Pulmonary:     Effort: Pulmonary effort is normal.     Breath sounds: Normal breath sounds.     Comments: ? Distant rales Abdominal:     General: Bowel sounds are normal.  Musculoskeletal:        General: Normal range of motion.     Cervical back: Normal range of motion and neck supple.  Skin:    General: Skin is warm and dry.  Neurological:     General: No focal deficit present.     Mental Status: He is alert and oriented to person, place, and time. Mental status is at baseline.  Psychiatric:        Mood and Affect: Mood normal.        Behavior: Behavior normal.        Thought Content: Thought content normal.        Judgment: Judgment normal.      Lab Results:  CBC    Component Value Date/Time   WBC 5.7 09/27/2021 1016   WBC 5.2 07/19/2021 0324   RBC 4.39 09/27/2021 1016   HGB 14.0 09/27/2021 1016   HGB 14.2 07/14/2021 1543   HCT 41.3 09/27/2021 1016   HCT 42.6 07/14/2021 1543   PLT 167 09/27/2021 1016   PLT 201 07/14/2021 1543   MCV 94.1 09/27/2021 1016    MCV 95 07/14/2021 1543   MCH 31.9 09/27/2021 1016   MCHC 33.9 09/27/2021 1016   RDW 12.5 09/27/2021 1016   RDW 12.4 07/14/2021 1543   LYMPHSABS 1.9 09/27/2021 1016   LYMPHSABS 1.8 08/15/2018 1001   MONOABS 0.5 09/27/2021 1016   EOSABS 0.1 09/27/2021 1016   EOSABS 0.1 08/15/2018 1001   BASOSABS 0.0 09/27/2021 1016   BASOSABS 0.0 08/15/2018 1001    BMET    Component Value Date/Time   NA 140 11/15/2021 1444   K 3.8 11/15/2021 1444   CL 97 11/15/2021 1444   CO2 23 11/15/2021 1444   GLUCOSE 236 (H) 11/15/2021 1444   GLUCOSE 212 (H) 09/27/2021 1016   BUN 17 11/15/2021 1444   CREATININE 1.33 (H) 11/15/2021 1444   CREATININE 1.31 (H) 09/27/2021 1016   CREATININE 1.33 (H) 01/03/2016 1505   CALCIUM 9.0 11/15/2021 1444   GFRNONAA >60 09/27/2021 1016   GFRNONAA 60 10/21/2013 1546   GFRAA 75 08/15/2018 1001   GFRAA 70 10/21/2013 1546    BNP    Component Value Date/Time   BNP 3.6 06/30/2016 1039    ProBNP    Component Value Date/Time   PROBNP <36 10/31/2021 1451    Imaging: No results found.   Assessment & Plan:   COVID-19 long hauler manifesting chronic dyspnea - Chronic dyspnea symptoms, unchanged. No better after completing cardiopulmonary rehab. Checking d-dimer, cbc, bmet and BNP today. Recommend getting updated CT  Imaging of his chest. He is scheduled for echocardiogram in September. May want to consider getting cardiopulmonary exercise stress trest. Continue Trelegy 174mg one puff daily.   History of pulmonary embolus (PE) - VQ scan in Feb 2023 showed no perfusion defect in either lung  - Continue anticoagulation with Xarelto '10mg'$  daily  - D-dimer in June trending down and within normal range   DM type 2, uncontrolled, with renal  complications - Complains of increased thirst, dehydration and muscle fatigue/pain in biceps  - Continue Jardiance '10mg'$  daily - Patient is looking for new GP - Checking BMET    Martyn Ehrich, NP 01/24/2022

## 2022-01-25 ENCOUNTER — Other Ambulatory Visit: Payer: Self-pay

## 2022-01-25 DIAGNOSIS — U099 Post covid-19 condition, unspecified: Secondary | ICD-10-CM

## 2022-01-25 LAB — CBC
HCT: 41.4 % (ref 39.0–52.0)
Hemoglobin: 13.9 g/dL (ref 13.0–17.0)
MCHC: 33.5 g/dL (ref 30.0–36.0)
MCV: 97.7 fl (ref 78.0–100.0)
Platelets: 193 10*3/uL (ref 150.0–400.0)
RBC: 4.23 Mil/uL (ref 4.22–5.81)
RDW: 13.9 % (ref 11.5–15.5)
WBC: 5.6 10*3/uL (ref 4.0–10.5)

## 2022-01-25 LAB — BASIC METABOLIC PANEL
BUN: 20 mg/dL (ref 6–23)
CO2: 29 mEq/L (ref 19–32)
Calcium: 8.9 mg/dL (ref 8.4–10.5)
Chloride: 99 mEq/L (ref 96–112)
Creatinine, Ser: 1.57 mg/dL — ABNORMAL HIGH (ref 0.40–1.50)
GFR: 45.52 mL/min — ABNORMAL LOW (ref >60.00)
Glucose, Bld: 161 mg/dL — ABNORMAL HIGH (ref 70–99)
Potassium: 3.8 mEq/L (ref 3.5–5.1)
Sodium: 138 mEq/L (ref 135–145)

## 2022-01-25 LAB — BRAIN NATRIURETIC PEPTIDE: Pro B Natriuretic peptide (BNP): 15 pg/mL (ref 0.0–100.0)

## 2022-01-25 NOTE — Progress Notes (Signed)
Please let patient know D-dimer was normal. Please order CT chest wo contrast re: post covid dyspnea

## 2022-01-25 NOTE — Progress Notes (Signed)
CBC and BNP were normal. Kidney function is reduced, he should follow-up with either PCP or nephrology   In regards to GP I asked a colleague and he said there were several good doctors with [facility] but depends on which location he wanted to go to and he also recommended [facility]  which is not part of [facility] but [facility]

## 2022-01-26 ENCOUNTER — Encounter: Payer: Self-pay | Admitting: Internal Medicine

## 2022-02-03 ENCOUNTER — Ambulatory Visit (HOSPITAL_BASED_OUTPATIENT_CLINIC_OR_DEPARTMENT_OTHER)
Admission: RE | Admit: 2022-02-03 | Discharge: 2022-02-03 | Disposition: A | Discharge status: Home / Self Care | Payer: Medicare Other | Source: Ambulatory Visit | Attending: Primary Care | Admitting: Primary Care

## 2022-02-03 DIAGNOSIS — R0609 Other forms of dyspnea: Secondary | ICD-10-CM | POA: Insufficient documentation

## 2022-02-03 DIAGNOSIS — U099 Post covid-19 condition, unspecified: Secondary | ICD-10-CM | POA: Insufficient documentation

## 2022-02-05 NOTE — Addendum Note (Signed)
Encounter addended by: Magda Kiel, RN on: 02/05/2022 8:45 AM  Actions taken: Clinical Note Signed

## 2022-02-06 ENCOUNTER — Encounter: Payer: Self-pay | Admitting: Internal Medicine

## 2022-02-08 NOTE — Telephone Encounter (Signed)
Beth, please advise on pt's CT results. Thanks.

## 2022-02-09 NOTE — Telephone Encounter (Signed)
CT results showed mild stable scarring to lungs, otherwise clear.  No acute process.  He did have extensive coronary artery calcifications, if he does not have a cardiologist would refer.  Otherwise he should follow-up with them regarding these findings.

## 2022-02-16 ENCOUNTER — Encounter
Payer: Medicare Other | Attending: Physical Medicine and Rehabilitation | Admitting: Physical Medicine and Rehabilitation

## 2022-02-16 ENCOUNTER — Encounter: Payer: Self-pay | Admitting: Physical Medicine and Rehabilitation

## 2022-02-16 VITALS — BP 97/62 | HR 75 | Ht 70.0 in | Wt 300.0 lb

## 2022-02-16 DIAGNOSIS — U099 Post covid-19 condition, unspecified: Secondary | ICD-10-CM | POA: Diagnosis present

## 2022-02-16 DIAGNOSIS — R0609 Other forms of dyspnea: Secondary | ICD-10-CM | POA: Diagnosis present

## 2022-02-16 MED ORDER — TRELEGY ELLIPTA 200-62.5-25 MCG/ACT IN AEPB: 200.0000 ug | INHALATION_SPRAY | Freq: Every day | RESPIRATORY_TRACT | 5 refills | Status: DC

## 2022-02-16 NOTE — Progress Notes (Signed)
Subjective:    Patient ID: [name], male    DOB: 18-Dec-1955, 66 y.o.   MRN: [id number]  HPI    Pt is a 66 yr old male with hx of DM, HTN, diabetic ulcer on R great toe; previous COVID and unprovoked PE in [place]- 7/22- most recently put on Jardiance- latest A1c ~ 7.0-8.0-  OSA on CPAP; AAA; HTN; EF 45%;  Post COVID respiratory SOB/DOE- here ff/u on long COVID.   For first week, Trelegy- miracle drug, but didn't work ever since.   Did 8 weeks of intense cardiac rehab-  Increased pressure/intensity on machines.  But winded walking from car to doctor's appointment- winded even taking a shower.   Has an ECHO scheduled on 02/27/22 Had PE from [facility] [place]-   Is stronger from cardiac rehab, but still huffing and puffing   Does 10-20% better than used to do, but still need power w/c in airports.   Sees Cards on 9/19 Hasn't had Pulmonary function tests since had COVID.    Hasn't been taking Albuterol- when SOB  Pain Inventory Average Pain 7 Pain Right Now 5 My pain is intermittent, dull, and aching  In the last 24 hours, has pain interfered with the following? General activity 2 Relation with others 4 Enjoyment of life 6 What TIME of day is your pain at its worst? night Sleep (in general) Poor  Pain is worse with: inactivity and standing Pain improves with: therapy/exercise Relief from Meds: 3  Family History  Problem Relation Age of Onset   Clotting disorder Mother        lung clot   Mesothelioma Father    Hypertension Brother    Skin cancer Brother    Colon cancer Neg Hx    Social History   Socioeconomic History   Marital status: Married    Spouse name: Not on file   Number of children: 2   Years of education: 16   Highest education level: Bachelor's degree (e.g., BA, AB, BS)  Occupational History   Occupation: owner    Comment: Associate Professor   Occupation: Retired  Tobacco Use   Smoking status: Former    Packs/day: 4.00    Years: 20.00    Total pack  years: 80.00    Types: Cigarettes    Start date: 07/19/1973    Quit date: 12/13/1991    Years since quitting: 30.2   Smokeless tobacco: Never  Vaping Use   Vaping Use: Never used  Substance and Sexual Activity   Alcohol use: Yes    Alcohol/week: 3.0 - 5.0 standard drinks of alcohol    Types: 3 - 5 Glasses of wine per week    Comment: takes 3 to 5 glasses of wine nightly    Drug use: No   Sexual activity: Not on file  Other Topics Concern   Not on file  Social History Narrative   Not on file   Social Determinants of Health   Financial Resource Strain: Not on file  Food Insecurity: Not on file  Transportation Needs: Not on file  Physical Activity: Not on file  Stress: Not on file  Social Connections: Not on file   Past Surgical History:  Procedure Laterality Date   CARDIAC CATHETERIZATION     approx 3 years ago    COLONOSCOPY WITH PROPOFOL N/A 03/10/2015   Procedure: COLONOSCOPY WITH PROPOFOL;  Surgeon: Milus Banister, MD;  Location: [facility];  Service: Endoscopy;  Laterality: N/A;   colonscopy  2012   CORONARY ATHERECTOMY N/A 07/18/2021   Procedure: CORONARY ATHERECTOMY;  Surgeon: Jettie Booze, MD;  Location: [place] [facility];  Service: Cardiovascular;  Laterality: N/A;   CORONARY STENT INTERVENTION N/A 07/18/2021   Procedure: CORONARY STENT INTERVENTION;  Surgeon: Jettie Booze, MD;  Location: [place] [facility];  Service: Cardiovascular;  Laterality: N/A;   ESOPHAGOGASTRODUODENOSCOPY (EGD) WITH PROPOFOL N/A 03/10/2015   Procedure: ESOPHAGOGASTRODUODENOSCOPY (EGD) WITH PROPOFOL;  Surgeon: Milus Banister, MD;  Location: [facility];  Service: Endoscopy;  Laterality: N/A;   HERNIA REPAIR     with gastric banding   LAPAROSCOPIC GASTRIC BANDING N/A 04/05/2014   Procedure: LAPAROSCOPIC GASTRIC BANDING;  Surgeon: Pedro Earls, MD;  Location: [facility];  Service: General;  Laterality: N/A;   MENISCUS REPAIR Left    left knee torn meniscus   RIGHT/LEFT  HEART CATH AND CORONARY ANGIOGRAPHY N/A 07/18/2021   Procedure: RIGHT/LEFT HEART CATH AND CORONARY ANGIOGRAPHY;  Surgeon: Jettie Booze, MD;  Location: [place] [facility];  Service: Cardiovascular;  Laterality: N/A;   VASECTOMY     Past Surgical History:  Procedure Laterality Date   CARDIAC CATHETERIZATION     approx 3 years ago    COLONOSCOPY WITH PROPOFOL N/A 03/10/2015   Procedure: COLONOSCOPY WITH PROPOFOL;  Surgeon: Milus Banister, MD;  Location: [facility];  Service: Endoscopy;  Laterality: N/A;   colonscopy      2012   CORONARY ATHERECTOMY N/A 07/18/2021   Procedure: CORONARY ATHERECTOMY;  Surgeon: Jettie Booze, MD;  Location: [place] [facility];  Service: Cardiovascular;  Laterality: N/A;   CORONARY STENT INTERVENTION N/A 07/18/2021   Procedure: CORONARY STENT INTERVENTION;  Surgeon: Jettie Booze, MD;  Location: [place] [facility];  Service: Cardiovascular;  Laterality: N/A;   ESOPHAGOGASTRODUODENOSCOPY (EGD) WITH PROPOFOL N/A 03/10/2015   Procedure: ESOPHAGOGASTRODUODENOSCOPY (EGD) WITH PROPOFOL;  Surgeon: Milus Banister, MD;  Location: [facility];  Service: Endoscopy;  Laterality: N/A;   HERNIA REPAIR     with gastric banding   LAPAROSCOPIC GASTRIC BANDING N/A 04/05/2014   Procedure: LAPAROSCOPIC GASTRIC BANDING;  Surgeon: Pedro Earls, MD;  Location: [facility];  Service: General;  Laterality: N/A;   MENISCUS REPAIR Left    left knee torn meniscus   RIGHT/LEFT HEART CATH AND CORONARY ANGIOGRAPHY N/A 07/18/2021   Procedure: RIGHT/LEFT HEART CATH AND CORONARY ANGIOGRAPHY;  Surgeon: Jettie Booze, MD;  Location: [place] [facility];  Service: Cardiovascular;  Laterality: N/A;   VASECTOMY     Past Medical History:  Diagnosis Date   Abdominal aortic aneurysm (AAA), 30-34 mm diameter ([place]) 06/01/2017   Nml w/ greatest dm 3 cm US Aorta 06/2016, Abd [place] 05/01/2017 showed infrarenal dilatation at 3.4 cm - rec repeat US in 3 yrs.   Arthritis    Cervical  disc disease 12/26/2011   Coronary artery disease involving native coronary artery of native heart without angina pectoris 12/01/2016   He had coronary angiography in 2011. There was irregularity within the LAD with up to 30% narrowing. The myocardial perfusion imaging done 11/07/2015 did not demonstrate any evidence of ischemia with a low risk nuclear stress test other than EF estimated at 47%. Chest CT 01/25/2016 showed diffuse coronary artery calcifications with heavy calcifications in the LAD. Pt seen by cardiology Dr. Linard Millers    Diabetes mellitus    Difficult intubation 04/05/2014   Dyslipidemia    Family history of anesthesia complication    pt states took days  for his father to awaken after mask was used    Fatty liver 06/01/2017   [place] 05/01/2017   GERD (gastroesophageal reflux disease)    has been having acid reflux since recent endoscopy    H/O hiatal hernia    repair with lap band, now has ventral hernia midline abd   Hearing loss-aides 03/26/2012   Hyperlipidemia    Hypertension    Morbid obesity ([place])    OSA (obstructive sleep apnea)    Rotator cuff tear    Seasonal allergies    Shortness of breath    pt states related to high BP meds with extended walking or climbling stairs   Sleep apnea    BP 97/62   Pulse 75   Ht '5\' 10"'$  (1.778 m)   Wt 300 lb (136.1 kg)   SpO2 96%   BMI 43.05 kg/m   Opioid Risk Score:   Fall Risk Score:  `1  Depression screen PHQ 2/9     11/21/2021    1:45 PM 11/10/2021    9:51 AM 08/15/2018    9:36 AM 04/30/2018   10:40 AM 10/01/2017    4:48 PM 03/14/2017    9:58 AM 11/17/2016   12:06 PM  Depression screen PHQ 2/9  Decreased Interest 0 2 0 0 0 0 0  Down, Depressed, Hopeless 0 1 0 0 0 0 0  PHQ - 2 Score 0 3 0 0 0 0 0  Altered sleeping  2     1  Tired, decreased energy  3     1  Change in appetite  1     1  Feeling bad or failure about yourself   0     0  Trouble concentrating  0     0  Moving slowly or fidgety/restless  1     0  Suicidal  thoughts  0     0  PHQ-9 Score  10     3      Review of Systems  Musculoskeletal:  Positive for back pain and neck pain.       Leg pain Pelvic pain Hand pain  All other systems reviewed and are negative.     Objective:   Physical Exam  Awake, alert, appropriate, accompanied by wife, NAD DOE after less than 50 ft of walking RRR CTA B/L- however sounds hoarse after walking.       Assessment & Plan:     Pt is a 66 yr old male with hx of DM, HTN, diabetic ulcer on R great toe; previous COVID and unprovoked PE in [facility]- 7/22- most recently put on Jardiance- latest A1c ~ 7.0-8.0-  OSA on CPAP; AAA; HTN; EF 45%;  Post COVID respiratory SOB/DOE- here for f/u on long COVID.   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  2. Will increase Trelegy to 200 mcg daily and see if will help DOE-   3. Was told was on Xarelto for life- - but only had 1 PE- that's resolved.  I think he needs to readdress with [facility] and see if could come off Xarelto.   4.  Of note, was a smoker 4ppd until 1993-  5.  On Metoprolol 25 mg long acting-  BP is 97/62 and BP low at home as well- is it  possible- see if can come off it- because is interacting with his Albuterol for Dyspnea-   6. Experiment- take the Albuterol before you do exercise of any kind- and see if it helps.    7. F/u in 3 months For Long COVID.    I spent a total of 33   minutes on total care today- >50% coordination of care- due to discussing options and educated on albuterol usage and long covid.

## 2022-02-16 NOTE — Patient Instructions (Signed)
Pt is a 66 yr old male with hx of DM, HTN, diabetic ulcer on R great toe; previous COVID and unprovoked PE in [place]- 7/22- most recently put on Jardiance- latest A1c ~ 7.0-8.0-  OSA on CPAP; AAA; HTN; EF 45%;  Post COVID respiratory SOB/DOE- here for f/u on long COVID.   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  2. Will increase Trelegy to 200 mcg daily and see if will help DOE-   3. Was told was on Xarelto for life- - but only had 1 PE- that's resolved.  I think he needs to readdress with [facility] and see if could come off Xarelto.   4.  Of note, was a smoker 4ppd until 1993-  5.  On Metoprolol 25 mg long acting-  BP is 97/62 and BP low at home as well- is it possible- see if can come off it- because is interacting with his Albuterol for Dyspnea-   6. Experiment- take the Albuterol before you do exercise of any kind- and see if it helps.    7. F/u in 3 months For Long COVID.

## 2022-02-22 ENCOUNTER — Ambulatory Visit (INDEPENDENT_AMBULATORY_CARE_PROVIDER_SITE_OTHER): Payer: Medicare Other | Admitting: Podiatry

## 2022-02-22 DIAGNOSIS — Z7901 Long term (current) use of anticoagulants: Secondary | ICD-10-CM

## 2022-02-22 DIAGNOSIS — L84 Corns and callosities: Secondary | ICD-10-CM | POA: Diagnosis not present

## 2022-02-22 DIAGNOSIS — E1149 Type 2 diabetes mellitus with other diabetic neurological complication: Secondary | ICD-10-CM

## 2022-02-22 DIAGNOSIS — B351 Tinea unguium: Secondary | ICD-10-CM

## 2022-02-24 NOTE — Progress Notes (Signed)
Subjective: 66 y.o. returns the office today for painful, elongated, thickened toenails which he cannot trim himself. Denies any redness or drainage around the nails.  The wound has been doing well but developed a callus overlying this area.  No swelling redness or drainage.  No open lesions that he reports.  Denies any systemic complaints such as fevers, chills, nausea, vomiting.   PCP: [name], MD  Objective: AAO 3, NAD DP/PT pulses palpable, CRT less than 3 seconds Chronic bilateral lower extremity edema present. Protective sensation decreased with Derrel Nip monofilament Nails hypertrophic, dystrophic, elongated, brittle, discolored 10. There is tenderness overlying the nails 1-5 bilaterally. There is no surrounding erythema or drainage along the nail sites. Hyperkeratotic lesion noted right medial hallux with some dried blood underneath the callus.  However upon debridement there is no underlying ulceration drainage or any signs of infection. No other areas of tenderness bilateral lower extremities. No overlying edema, erythema, increased warmth. No pain with calf compression, swelling, warmth, erythema.  Assessment: Patient presents with symptomatic onychomycosis, ulcerative callus  Plan: -Treatment options including alternatives, risks, complications were discussed -Nails sharply debrided 10 without complication/bleeding. -Sharply debrided hyperkeratotic lesion x1 without any complications or bleeding.  Continue moisturizer, offloading. -Discussed daily foot inspection. If there are any changes, to call the office immediately.  -Follow-up in 3 months or sooner if any problems are to arise. In the meantime, encouraged to call the office with any questions, concerns, changes symptoms.  Celesta Gentile, DPM

## 2022-02-26 ENCOUNTER — Ambulatory Visit (INDEPENDENT_AMBULATORY_CARE_PROVIDER_SITE_OTHER): Payer: Medicare Other | Admitting: Family Medicine

## 2022-02-26 ENCOUNTER — Encounter: Payer: Self-pay | Admitting: Family Medicine

## 2022-02-26 VITALS — BP 105/66 | HR 70 | Temp 98.0°F | Ht 70.0 in | Wt 305.0 lb

## 2022-02-26 DIAGNOSIS — R0609 Other forms of dyspnea: Secondary | ICD-10-CM

## 2022-02-26 DIAGNOSIS — I152 Hypertension secondary to endocrine disorders: Secondary | ICD-10-CM

## 2022-02-26 DIAGNOSIS — Z86711 Personal history of pulmonary embolism: Secondary | ICD-10-CM | POA: Diagnosis not present

## 2022-02-26 DIAGNOSIS — I5030 Unspecified diastolic (congestive) heart failure: Secondary | ICD-10-CM

## 2022-02-26 DIAGNOSIS — E785 Hyperlipidemia, unspecified: Secondary | ICD-10-CM

## 2022-02-26 DIAGNOSIS — E1165 Type 2 diabetes mellitus with hyperglycemia: Secondary | ICD-10-CM

## 2022-02-26 DIAGNOSIS — E1169 Type 2 diabetes mellitus with other specified complication: Secondary | ICD-10-CM

## 2022-02-26 DIAGNOSIS — E1159 Type 2 diabetes mellitus with other circulatory complications: Secondary | ICD-10-CM

## 2022-02-26 DIAGNOSIS — J309 Allergic rhinitis, unspecified: Secondary | ICD-10-CM

## 2022-02-26 DIAGNOSIS — M539 Dorsopathy, unspecified: Secondary | ICD-10-CM

## 2022-02-26 DIAGNOSIS — U099 Post covid-19 condition, unspecified: Secondary | ICD-10-CM

## 2022-02-26 DIAGNOSIS — M1990 Unspecified osteoarthritis, unspecified site: Secondary | ICD-10-CM

## 2022-02-26 LAB — T4, FREE: Free T4: 0.84 ng/dL (ref 0.60–1.60)

## 2022-02-26 LAB — COMPREHENSIVE METABOLIC PANEL
ALT: 24 U/L (ref 0–53)
AST: 31 U/L (ref 0–37)
Albumin: 4.2 g/dL (ref 3.5–5.2)
Alkaline Phosphatase: 55 U/L (ref 39–117)
BUN: 15 mg/dL (ref 6–23)
CO2: 31 mEq/L (ref 19–32)
Calcium: 9.1 mg/dL (ref 8.4–10.5)
Chloride: 98 mEq/L (ref 96–112)
Creatinine, Ser: 1.45 mg/dL (ref 0.40–1.50)
GFR: 50.05 mL/min — ABNORMAL LOW (ref >60.00)
Glucose, Bld: 119 mg/dL — ABNORMAL HIGH (ref 70–99)
Potassium: 3.7 mEq/L (ref 3.5–5.1)
Sodium: 141 mEq/L (ref 135–145)
Total Bilirubin: 0.5 mg/dL (ref 0.2–1.2)
Total Protein: 7.2 g/dL (ref 6.0–8.3)

## 2022-02-26 LAB — CBC
HCT: 45.2 % (ref 39.0–52.0)
Hemoglobin: 15.2 g/dL (ref 13.0–17.0)
MCHC: 33.6 g/dL (ref 30.0–36.0)
MCV: 97.7 fl (ref 78.0–100.0)
Platelets: 195 10*3/uL (ref 150.0–400.0)
RBC: 4.63 Mil/uL (ref 4.22–5.81)
RDW: 13.9 % (ref 11.5–15.5)
WBC: 5.3 10*3/uL (ref 4.0–10.5)

## 2022-02-26 LAB — TSH: TSH: 3.95 u[IU]/mL (ref 0.35–5.50)

## 2022-02-26 LAB — HEMOGLOBIN A1C: Hgb A1c MFr Bld: 7.4 % — ABNORMAL HIGH (ref 4.6–6.5)

## 2022-02-26 LAB — T3, FREE: T3, Free: 3.2 pg/mL (ref 2.3–4.2)

## 2022-02-26 NOTE — Progress Notes (Signed)
[name] is a 66 y.o. male who presents today for an office visit.  He is a new patient.   Assessment/Plan:  Chronic Problems Addressed Today: COVID-19 long hauler manifesting chronic dyspnea Persistent dyspnea.  He did recently have a CT scan which showed coronary artery calcifications which has been managed by cardiology.  He will be undergoing echocardiogram tomorrow and follow-up with cardiology next week.  He is currently on Trelegy 100 mcg once daily per pulmonology.  T2DM (type 2 diabetes mellitus) ([place]) Currently on Tresiba 114 units daily and Jardiance 10 mg daily.  We will check A1c today.  His home sugar readings seem to be at goal.  Follow-up again in 3 months.  Allergic rhinitis Has had worsening postnasal drip and hoarseness for the last 1 to 2 months.  We discussed referral to ENT however he deferred for now.  We will continue over-the-counter medications.  Multilevel degenerative disc disease We will place referral to orthopedist.  He is having some radicular symptoms in the lower extremities.  Osteoarthritis We will be placing referral to orthopedics as above.  DOE (dyspnea on exertion) This is a significant source of stress for patient.  He is undergoing pulmonary and cardiac work-up with pulmonology and cardiology.  He will have an echocardiogram tomorrow.  He did have coronary angiogram earlier this year with 2 stents placed.  Currently on Plavix.  Dyslipidemia associated with type 2 diabetes mellitus (HCC) On Crestor 40 mg daily.  Hypertension associated with diabetes ([place]) Blood pressure 105/66 on current regimen of lisinopril 20 mg daily, metoprolol succinate 25 mg daily, diltiazem 300 mg daily.  He is concerned his blood pressure readings may be too low.  He will follow-up with cardiology next week to discuss.  History of pulmonary embolus (PE) Patient diagnosed with PE last year.  Thought this was probably due to COVID infection.  He is currently on  Xarelto 10 mg daily as a maintenance dose though is also on Plavix due to his coronary arteries disease status post stenting.  We discussed risk and benefits of continued anticoagulation.  It was initially thought that this may have been an unprovoked PE however sounds like is probably related to his COVID infection.  We discussed stopping Xarelto given that he has completed a full 6 months of treatment at this point.  He would like to continue for now.  We can discuss at next office visit.  (HFpEF) heart failure with preserved ejection fraction ([place]) Continue management per cardiology.  Will be getting echocardiogram tomorrow.  No signs of volume overload today.     Subjective:  HPI:  See A/p for status of chronic conditions.  ROS: Per HPI, otherwise a complete review of systems was negative.   PMH:  The following were reviewed and entered/updated in epic: Past Medical History:  Diagnosis Date   Abdominal aortic aneurysm (AAA), 30-34 mm diameter ([place]) 06/01/2017   Nml w/ greatest dm 3 cm US Aorta 06/2016, Abd [place] 05/01/2017 showed infrarenal dilatation at 3.4 cm - rec repeat US in 3 yrs.   Arthritis    Cervical disc disease 12/26/2011   Coronary artery disease involving native coronary artery of native heart without angina pectoris 12/01/2016   He had coronary angiography in 2011. There was irregularity within the LAD with up to 30% narrowing. The myocardial perfusion imaging done 11/07/2015 did not demonstrate any evidence of ischemia with a low risk nuclear stress test other than EF estimated at 47%. Chest CT 01/25/2016  showed diffuse coronary artery calcifications with heavy calcifications in the LAD. Pt seen by cardiology Dr. Linard Millers    Diabetes mellitus    Difficult intubation 04/05/2014   Dyslipidemia    Family history of anesthesia complication    pt states took days for his father to awaken after mask was used    Fatty liver 06/01/2017   [place] 05/01/2017   GERD (gastroesophageal  reflux disease)    has been having acid reflux since recent endoscopy    H/O hiatal hernia    repair with lap band, now has ventral hernia midline abd   Hearing loss-aides 03/26/2012   Hyperlipidemia    Hypertension    Lapband APS + hiatal hernia repair Oct 2015 04/05/2014   Morbid obesity ([place])    OSA (obstructive sleep apnea)    Rotator cuff tear    Seasonal allergies    Shortness of breath    pt states related to high BP meds with extended walking or climbling stairs   Sleep apnea    Patient Active Problem List   Diagnosis Date Noted   Osteoarthritis 02/26/2022   Multilevel degenerative disc disease 02/26/2022   Allergic rhinitis 02/26/2022   DOE (dyspnea on exertion) 02/16/2022   Paraseptal emphysema ([place]) 02/15/2021   (HFpEF) heart failure with preserved ejection fraction ([place]) 02/01/2021   History of pulmonary embolus (PE) 02/01/2021   Aortic atherosclerosis ([place]) 10/21/2020   Fatty liver 06/01/2017   Abdominal aortic aneurysm (AAA), 30-34 mm diameter ([place]) 06/01/2017   Coronary artery disease involving native coronary artery of native heart without angina pectoris 12/01/2016   COVID-19 long hauler manifesting chronic dyspnea 11/03/2015   T2DM (type 2 diabetes mellitus) ([place]) 12/28/2013   Hearing loss-aides 03/26/2012   OSA (obstructive sleep apnea) 12/26/2011   Cervical disc disease 12/26/2011   Hypertension associated with diabetes ([place]) 08/29/2011   Dyslipidemia associated with type 2 diabetes mellitus ([place]) 08/29/2011   Past Surgical History:  Procedure Laterality Date   CARDIAC CATHETERIZATION     approx 3 years ago    COLONOSCOPY WITH PROPOFOL N/A 03/10/2015   Procedure: COLONOSCOPY WITH PROPOFOL;  Surgeon: Milus Banister, MD;  Location: [facility];  Service: Endoscopy;  Laterality: N/A;   colonscopy      2012   CORONARY ATHERECTOMY N/A 07/18/2021   Procedure: CORONARY ATHERECTOMY;  Surgeon: Jettie Booze, MD;  Location: [facility];  Service:  Cardiovascular;  Laterality: N/A;   CORONARY STENT INTERVENTION N/A 07/18/2021   Procedure: CORONARY STENT INTERVENTION;  Surgeon: Jettie Booze, MD;  Location: [facility];  Service: Cardiovascular;  Laterality: N/A;   ESOPHAGOGASTRODUODENOSCOPY (EGD) WITH PROPOFOL N/A 03/10/2015   Procedure: ESOPHAGOGASTRODUODENOSCOPY (EGD) WITH PROPOFOL;  Surgeon: Milus Banister, MD;  Location: [facility];  Service: Endoscopy;  Laterality: N/A;   HERNIA REPAIR     with gastric banding   LAPAROSCOPIC GASTRIC BANDING N/A 04/05/2014   Procedure: LAPAROSCOPIC GASTRIC BANDING;  Surgeon: Pedro Earls, MD;  Location: [facility];  Service: General;  Laterality: N/A;   MENISCUS REPAIR Left    left knee torn meniscus   RIGHT/LEFT HEART CATH AND CORONARY ANGIOGRAPHY N/A 07/18/2021   Procedure: RIGHT/LEFT HEART CATH AND CORONARY ANGIOGRAPHY;  Surgeon: Jettie Booze, MD;  Location: [facility];  Service: Cardiovascular;  Laterality: N/A;   VASECTOMY      Family History  Problem Relation Age of Onset   Clotting disorder Mother        lung clot  Mesothelioma Father    Hypertension Brother    Skin cancer Brother    Colon cancer Neg Hx     Medications- reviewed and updated Current Outpatient Medications  Medication Sig Dispense Refill   acetaminophen (TYLENOL) 500 MG tablet Take 500 mg by mouth every 6 (six) hours as needed (for pain.).     albuterol (VENTOLIN HFA) 108 (90 Base) MCG/ACT inhaler Inhale 2 puffs into the lungs every 6 (six) hours as needed for wheezing or shortness of breath (take as needed for SOB/wheezing). 8 g 5   clopidogrel (PLAVIX) 75 MG tablet Take 1 tablet (75 mg total) by mouth daily with breakfast. 90 tablet 3   diltiazem (CARDIZEM CD) 300 MG 24 hr capsule Take 300 mg by mouth in the morning.     diphenhydrAMINE (BENADRYL) 25 MG tablet Take 25 mg by mouth every 6 (six) hours as needed for itching.     empagliflozin (JARDIANCE) 10 MG TABS tablet Take 1 tablet  (10 mg total) by mouth daily before breakfast. 90 tablet 3   fluticasone (FLONASE) 50 MCG/ACT nasal spray Place into the nose as needed.     Fluticasone-Umeclidin-Vilant (TRELEGY ELLIPTA) 200-62.5-25 MCG/ACT AEPB Inhale 200 mcg into the lungs daily. 1 each 5   glucose blood (TRUETEST TEST) test strip Use as instructed 100 each 2   insulin degludec (TRESIBA FLEXTOUCH) 200 UNIT/ML FlexTouch Pen Inject 114 Units into the skin in the morning.     Insulin Pen Needle (B-D UF III MINI PEN NEEDLES) 31G X 5 MM MISC USE AS DIRECTED ONCE A DAY 100 each 11   lisinopril (ZESTRIL) 20 MG tablet Take 1 tablet (20 mg total) by mouth daily. 90 tablet 3   metoprolol succinate (TOPROL XL) 25 MG 24 hr tablet Take 1 tablet (25 mg total) by mouth daily. 90 tablet 3   Multiple Vitamin (MULTIVITAMIN WITH MINERALS) TABS tablet Take 1 tablet by mouth in the morning.     Potassium Chloride ER 20 MEQ TBCR Take 60 mEq by mouth daily. (Patient taking differently: Take 60 mEq by mouth in the morning.) 180 tablet 3   rivaroxaban (XARELTO) 10 MG TABS tablet Take 1 tablet (10 mg total) by mouth daily. 30 tablet 3   rosuvastatin (CRESTOR) 40 MG tablet Take 1 tablet (40 mg total) by mouth daily. 90 tablet 3   tamsulosin (FLOMAX) 0.4 MG CAPS capsule Take 1 capsule (0.4 mg total) by mouth daily after supper. 90 capsule 0   torsemide (DEMADEX) 20 MG tablet Take 3 tablets (60 mg total) by mouth daily. 270 tablet 3   No current facility-administered medications for this visit.    Allergies-reviewed and updated Allergies  Allergen Reactions   Farxiga [Dapagliflozin] Diarrhea and Itching   Erythromycin Nausea Only   Penicillins Nausea Only    Upset stomach    Social History   Socioeconomic History   Marital status: Married    Spouse name: Not on file   Number of children: 2   Years of education: 16   Highest education level: Bachelor's degree (e.g., BA, AB, BS)  Occupational History   Occupation: owner    Comment: Medical illustrator   Occupation: Retired  Tobacco Use   Smoking status: Former    Packs/day: 4.00    Years: 20.00    Total pack years: 80.00    Types: Cigarettes    Start date: 07/19/1973    Quit date: 12/13/1991    Years since quitting: 30.2   Smokeless tobacco:  Never  Vaping Use   Vaping Use: Never used  Substance and Sexual Activity   Alcohol use: Yes    Alcohol/week: 3.0 - 5.0 standard drinks of alcohol    Types: 3 - 5 Glasses of wine per week    Comment: takes 3 to 5 glasses of wine nightly    Drug use: No   Sexual activity: Not on file  Other Topics Concern   Not on file  Social History Narrative   Not on file   Social Determinants of Health   Financial Resource Strain: Not on file  Food Insecurity: Not on file  Transportation Needs: Not on file  Physical Activity: Not on file  Stress: Not on file  Social Connections: Not on file           Objective:  Physical Exam: BP 105/66   Pulse 70   Temp 98 F (36.7 C) (Temporal)   Ht '5\' 10"'$  (1.778 m)   Wt (!) 305 lb (138.3 kg)   SpO2 96%   BMI 43.76 kg/m   Gen: No acute distress, resting comfortably CV: Regular rate and rhythm with no murmurs appreciated Pulm: Normal work of breathing, clear to auscultation bilaterally with no crackles, wheezes, or rhonchi Neuro: Grossly normal, moves all extremities Psych: Normal affect and thought content  Time Spent: 65 minutes of total time was spent on the date of the encounter performing the following actions: chart review prior to seeing the patient including previous visits with specialists, obtaining history, performing a medically necessary exam, counseling on the treatment plan, placing orders, and documenting in our EHR.        Algis Greenhouse. Jerline Pain, MD 02/26/2022 10:30 AM

## 2022-02-26 NOTE — Assessment & Plan Note (Signed)
Has had worsening postnasal drip and hoarseness for the last 1 to 2 months.  We discussed referral to ENT however he deferred for now.  We will continue over-the-counter medications.

## 2022-02-26 NOTE — Assessment & Plan Note (Signed)
Blood pressure 105/66 on current regimen of lisinopril 20 mg daily, metoprolol succinate 25 mg daily, diltiazem 300 mg daily.  He is concerned his blood pressure readings may be too low.  He will follow-up with cardiology next week to discuss.

## 2022-02-26 NOTE — Assessment & Plan Note (Signed)
We will place referral to orthopedist.  He is having some radicular symptoms in the lower extremities.

## 2022-02-26 NOTE — Assessment & Plan Note (Signed)
We will be placing referral to orthopedics as above.

## 2022-02-26 NOTE — Assessment & Plan Note (Signed)
Currently on Tresiba 114 units daily and Jardiance 10 mg daily.  We will check A1c today.  His home sugar readings seem to be at goal.  Follow-up again in 3 months.

## 2022-02-26 NOTE — Assessment & Plan Note (Signed)
This is a significant source of stress for patient.  He is undergoing pulmonary and cardiac work-up with pulmonology and cardiology.  He will have an echocardiogram tomorrow.  He did have coronary angiogram earlier this year with 2 stents placed.  Currently on Plavix.

## 2022-02-26 NOTE — Assessment & Plan Note (Signed)
Persistent dyspnea.  He did recently have a CT scan which showed coronary artery calcifications which has been managed by cardiology.  He will be undergoing echocardiogram tomorrow and follow-up with cardiology next week.  He is currently on Trelegy 100 mcg once daily per pulmonology.

## 2022-02-26 NOTE — Assessment & Plan Note (Signed)
Patient diagnosed with PE last year.  Thought this was probably due to COVID infection.  He is currently on Xarelto 10 mg daily as a maintenance dose though is also on Plavix due to his coronary arteries disease status post stenting.  We discussed risk and benefits of continued anticoagulation.  It was initially thought that this may have been an unprovoked PE however sounds like is probably related to his COVID infection.  We discussed stopping Xarelto given that he has completed a full 6 months of treatment at this point.  He would like to continue for now.  We can discuss at next office visit.

## 2022-02-26 NOTE — Assessment & Plan Note (Signed)
On Crestor 40 mg daily.

## 2022-02-26 NOTE — Patient Instructions (Signed)
It was very nice to see you today!  We will check blood work today.  No medication changes today.  I will refer you to see an orthopedist.  I will see back in 3 months.  Please come back to see me sooner if needed.  Take care, Dr Jerline Pain  PLEASE NOTE:  If you had any lab tests please let us know if you have not heard back within a few days. You may see your results on mychart before we have a chance to review them but we will give you a call once they are reviewed by [place]. If we ordered any referrals today, please let us know if you have not heard from their office within the next week.   Please try these tips to maintain a healthy lifestyle:  Eat at least 3 REAL meals and 1-2 snacks per day.  Aim for no more than 5 hours between eating.  If you eat breakfast, please do so within one hour of getting up.   Each meal should contain half fruits/vegetables, one quarter protein, and one quarter carbs (no bigger than a computer mouse)  Cut down on sweet beverages. This includes juice, soda, and sweet tea.   Drink at least 1 glass of water with each meal and aim for at least 8 glasses per day  Exercise at least 150 minutes every week.

## 2022-02-26 NOTE — Assessment & Plan Note (Addendum)
Continue management per cardiology.  Will be getting echocardiogram tomorrow.  No signs of volume overload today.

## 2022-02-27 ENCOUNTER — Ambulatory Visit (HOSPITAL_COMMUNITY): Payer: Medicare Other | Attending: Cardiology

## 2022-02-27 DIAGNOSIS — I5030 Unspecified diastolic (congestive) heart failure: Secondary | ICD-10-CM | POA: Diagnosis present

## 2022-02-27 DIAGNOSIS — R0609 Other forms of dyspnea: Secondary | ICD-10-CM | POA: Insufficient documentation

## 2022-02-27 DIAGNOSIS — I700 Atherosclerosis of aorta: Secondary | ICD-10-CM | POA: Insufficient documentation

## 2022-02-27 LAB — ECHOCARDIOGRAM COMPLETE
Area-P 1/2: 3.31 cm2
S' Lateral: 2.9 cm

## 2022-03-01 NOTE — Progress Notes (Signed)
Please inform patient of the following:  A1c is up a little bit but stable.  Everything else is stable.  Do not need to make any changes to treatment plan at this time.  He ehould continue to work on diet and exercise.  We will see him back in 3 months to recheck A1c.

## 2022-03-04 ENCOUNTER — Encounter: Payer: Self-pay | Admitting: Internal Medicine

## 2022-03-04 ENCOUNTER — Encounter: Payer: Self-pay | Admitting: Family Medicine

## 2022-03-05 ENCOUNTER — Encounter: Payer: Self-pay | Admitting: Gastroenterology

## 2022-03-05 NOTE — Progress Notes (Unsigned)
Cardiology Office Note:    Date:  03/06/2022   ID:  [name], DOB 1956-05-31, MRN [id number]  PCP:  Vivi Barrack, MD   [facility] Providers Cardiologist:  Werner Lean, MD     Referring MD: [name], MD   CC: SOB  History of Present Illness:    [name] is a 66 y.o. male with a hx of mild non obstructive CAD, AAA,Morbid Obesity, DM, and HTN; OSA on CPAP COVID-19 2021 who presents for evaluation in 5/22 2022: patient started on lasix with some improved symptoms, then needed torsemide, preserved EF  2023: CAD and had PCI, CPET suggestive of deconditioning, saw pulm, and V/Q scan.   Called with SOB.  Seen PT advice notes: he met a cardiologist at [place] that helped him through the airport.  Decreased down to two glass of wine.  Patient notes that he is doing the same.  Had increase in his triology  Has never really had chest pain. No difference in sx with PCI. Felt a little bit stronger with cardiopulmonary rehab. He has felt no different on beta blocker, BP increase to SBP 120 mg. No different in sx with increased diuretic.  Notes worsening bleeding on xarelto and would like to come off. He is down from 1.5 bottles or wine to one bottle. NO palpitations. Weight 300-305 lbs.   Past Medical History:  Diagnosis Date   Abdominal aortic aneurysm (AAA), 30-34 mm diameter ([place]) 06/01/2017   Nml w/ greatest dm 3 cm US Aorta 06/2016, Abd [place] 05/01/2017 showed infrarenal dilatation at 3.4 cm - rec repeat US in 3 yrs.   Arthritis    Cervical disc disease 12/26/2011   Coronary artery disease involving native coronary artery of native heart without angina pectoris 12/01/2016   He had coronary angiography in 2011. There was irregularity within the LAD with up to 30% narrowing. The myocardial perfusion imaging done 11/07/2015 did not demonstrate any evidence of ischemia with a low risk nuclear stress test other than EF estimated at 47%. Chest CT 01/25/2016  showed diffuse coronary artery calcifications with heavy calcifications in the LAD. Pt seen by cardiology Dr. Linard Millers    Diabetes mellitus    Difficult intubation 04/05/2014   Dyslipidemia    Family history of anesthesia complication    pt states took days for his father to awaken after mask was used    Fatty liver 06/01/2017   [place] 05/01/2017   GERD (gastroesophageal reflux disease)    has been having acid reflux since recent endoscopy    H/O hiatal hernia    repair with lap band, now has ventral hernia midline abd   Hearing loss-aides 03/26/2012   Hyperlipidemia    Hypertension    Lapband APS + hiatal hernia repair Oct 2015 04/05/2014   Morbid obesity ([place])    OSA (obstructive sleep apnea)    Rotator cuff tear    Seasonal allergies    Shortness of breath    pt states related to high BP meds with extended walking or climbling stairs   Sleep apnea     Past Surgical History:  Procedure Laterality Date   CARDIAC CATHETERIZATION     approx 3 years ago    COLONOSCOPY WITH PROPOFOL N/A 03/10/2015   Procedure: COLONOSCOPY WITH PROPOFOL;  Surgeon: Milus Banister, MD;  Location: [facility];  Service: Endoscopy;  Laterality: N/A;   colonscopy      2012   CORONARY ATHERECTOMY N/A  07/18/2021   Procedure: CORONARY ATHERECTOMY;  Surgeon: Jettie Booze, MD;  Location: [place] [facility];  Service: Cardiovascular;  Laterality: N/A;   CORONARY STENT INTERVENTION N/A 07/18/2021   Procedure: CORONARY STENT INTERVENTION;  Surgeon: Jettie Booze, MD;  Location: [facility];  Service: Cardiovascular;  Laterality: N/A;   ESOPHAGOGASTRODUODENOSCOPY (EGD) WITH PROPOFOL N/A 03/10/2015   Procedure: ESOPHAGOGASTRODUODENOSCOPY (EGD) WITH PROPOFOL;  Surgeon: Milus Banister, MD;  Location: [facility];  Service: Endoscopy;  Laterality: N/A;   HERNIA REPAIR     with gastric banding   LAPAROSCOPIC GASTRIC BANDING N/A 04/05/2014   Procedure: LAPAROSCOPIC GASTRIC BANDING;  Surgeon: Pedro Earls, MD;  Location: [facility];  Service: General;  Laterality: N/A;   MENISCUS REPAIR Left    left knee torn meniscus   RIGHT/LEFT HEART CATH AND CORONARY ANGIOGRAPHY N/A 07/18/2021   Procedure: RIGHT/LEFT HEART CATH AND CORONARY ANGIOGRAPHY;  Surgeon: Jettie Booze, MD;  Location: [facility];  Service: Cardiovascular;  Laterality: N/A;   VASECTOMY      Current Medications: Current Meds  Medication Sig   acetaminophen (TYLENOL) 500 MG tablet Take 500 mg by mouth every 6 (six) hours as needed (for pain.).   albuterol (VENTOLIN HFA) 108 (90 Base) MCG/ACT inhaler Inhale 2 puffs into the lungs every 6 (six) hours as needed for wheezing or shortness of breath (take as needed for SOB/wheezing).   clopidogrel (PLAVIX) 75 MG tablet Take 1 tablet (75 mg total) by mouth daily with breakfast.   diltiazem (CARDIZEM CD) 300 MG 24 hr capsule Take 300 mg by mouth in the morning.   diphenhydrAMINE (BENADRYL) 25 MG tablet Take 25 mg by mouth every 6 (six) hours as needed for itching.   empagliflozin (JARDIANCE) 10 MG TABS tablet Take 1 tablet (10 mg total) by mouth daily before breakfast.   fluticasone (FLONASE) 50 MCG/ACT nasal spray Place into the nose as needed.   Fluticasone-Umeclidin-Vilant (TRELEGY ELLIPTA) 200-62.5-25 MCG/ACT AEPB Inhale 200 mcg into the lungs daily.   glucose blood (TRUETEST TEST) test strip Use as instructed   insulin degludec (TRESIBA FLEXTOUCH) 200 UNIT/ML FlexTouch Pen Inject 114 Units into the skin in the morning.   Insulin Pen Needle (B-D UF III MINI PEN NEEDLES) 31G X 5 MM MISC USE AS DIRECTED ONCE A DAY   ketoconazole (NIZORAL) 2 % cream 2 (two) times a week.   lisinopril (ZESTRIL) 20 MG tablet Take 1 tablet (20 mg total) by mouth daily.   Multiple Vitamin (MULTIVITAMIN WITH MINERALS) TABS tablet Take 1 tablet by mouth in the morning.   Potassium Chloride ER 20 MEQ TBCR Take 60 mEq by mouth daily.   rivaroxaban (XARELTO) 10 MG TABS tablet Take 1 tablet (10 mg  total) by mouth daily.   rosuvastatin (CRESTOR) 40 MG tablet Take 1 tablet (40 mg total) by mouth daily.   silver sulfADIAZINE (SSD) 1 % cream 2 (two) times a week.   tamsulosin (FLOMAX) 0.4 MG CAPS capsule Take 1 capsule (0.4 mg total) by mouth daily after supper.   torsemide (DEMADEX) 20 MG tablet Take 3 tablets (60 mg total) by mouth daily.   [DISCONTINUED] metoprolol succinate (TOPROL XL) 25 MG 24 hr tablet Take 1 tablet (25 mg total) by mouth daily.     Allergies:   Farxiga [dapagliflozin] and Penicillins   Social History   Socioeconomic History   Marital status: Married    Spouse name: Not on file   Number of children: 2  Years of education: 25   Highest education level: Bachelor's degree (e.g., BA, AB, BS)  Occupational History   Occupation: owner    Comment: Associate Professor   Occupation: Retired  Tobacco Use   Smoking status: Former    Packs/day: 4.00    Years: 20.00    Total pack years: 80.00    Types: Cigarettes    Start date: 07/19/1973    Quit date: 12/13/1991    Years since quitting: 30.2   Smokeless tobacco: Never  Vaping Use   Vaping Use: Never used  Substance and Sexual Activity   Alcohol use: Yes    Alcohol/week: 3.0 - 5.0 standard drinks of alcohol    Types: 3 - 5 Glasses of wine per week    Comment: takes 3 to 5 glasses of wine nightly    Drug use: No   Sexual activity: Not on file  Other Topics Concern   Not on file  Social History Narrative   Not on file   Social Determinants of Health   Financial Resource Strain: Not on file  Food Insecurity: Not on file  Transportation Needs: Not on file  Physical Activity: Not on file  Stress: Not on file  Social Connections: Not on file    Social: I took care of his wife Margarita Grizzle as well; 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 There is no history of Colon cancer. History of coronary artery disease notable for no members. History of heart failure notable for no members. History of arrhythmia notable for no members.  ROS:   Please see the history of present illness.     All other systems reviewed and are negative.  EKGs/Labs/Other Studies Reviewed:    The following studies were reviewed today:  EKG:   10/21/20: Sinus bradycardia rate 55 1st HB  NonCardiac CT: Date: 01/25/2016 Results: Aortic Atherosclerosis 3VD Calcification with LAD predominance  OSH CTPE  DATE 12/16/20 The ascending thoracic aorta measures 3.6 x 3.5 cm in transverse by AP dimension. The descending thoracic aorta measures 2.6 x 2.6 cm in transverse by AP dimension. There is atherosclerotic disease of the thoracic aorta and coronary arteries without evidence of focal intimal irregularity.   There is a central pulmonary arterial filling defect in the right lower lobe segmental pulmonary artery (series 4, image 43). No CT evidence of right heart strain. No pericardial effusion or bulky mediastinal adenopathy.   No focal consolidation. No pleural effusion or pneumothorax.   Gastric band is present. There is a cyst dorsally seen in the upper pole the right kidney. The visualized portions of the upper abdomen are otherwiseunremarkable.   ECHO COMPLETE WO IMAGING ENHANCING AGENT 02/27/2022  Narrative ECHOCARDIOGRAM REPORT    Patient Name:   [name] Date of Exam: 02/27/2022 Medical Rec #:  [id number]        Height:       70.0 in Accession #:    [id number]       Weight:       305.0 lb Date of Birth:  26-Mar-1956        BSA:          2.497 m Patient Age:    47 years  BP:           123/73 mmHg Patient Gender: M                HR:           72 bpm. Exam Location:  [place]  Procedure: 2D Echo, Cardiac Doppler, Color Doppler and Strain Analysis  Indications:     I50.30 heart failure with preserved EF  History:        Patient has prior history of Echocardiogram examinations, most recent 02/03/2021. CAD, AAA, Pulmonary embolus, Signs/Symptoms:Dyspnea; Risk Factors:Diabetes, Hypertension and Dyslipidemia.  Sonographer:    Marygrace Drought RCS Referring Phys: [id number] [place] A Anivea Velasques  IMPRESSIONS   1. Left ventricular ejection fraction, by estimation, is 60 to 65%. The left ventricle has normal function. The left ventricle has no regional wall motion abnormalities. Left ventricular diastolic parameters are consistent with Grade I diastolic dysfunction (impaired relaxation). The average left ventricular global longitudinal strain is -23.7 %. The global longitudinal strain is normal. 2. Right ventricular systolic function is normal. The right ventricular size is normal. 3. The mitral valve is normal in structure. No evidence of mitral valve regurgitation. No evidence of mitral stenosis. 4. The aortic valve is tricuspid. There is mild calcification of the aortic valve. There is mild thickening of the aortic valve. Aortic valve regurgitation is not visualized. Aortic valve sclerosis is present, with no evidence of aortic valve stenosis. 5. The inferior vena cava is normal in size with greater than 50% respiratory variability, suggesting right atrial pressure of 3 mmHg.  Comparison(s): No significant change from prior study. Prior images reviewed side by side.  FINDINGS Left Ventricle: Left ventricular ejection fraction, by estimation, is 60 to 65%. The left ventricle has normal function. The left ventricle has no regional wall motion abnormalities. The average left ventricular global longitudinal strain is -23.7 %. The global longitudinal strain is normal. The left ventricular internal cavity size was normal in size. There is no left ventricular hypertrophy. Left ventricular diastolic parameters are consistent with Grade I diastolic dysfunction  (impaired relaxation).  Right Ventricle: The right ventricular size is normal. No increase in right ventricular wall thickness. Right ventricular systolic function is normal.  Left Atrium: Left atrial size was normal in size.  Right Atrium: Right atrial size was normal in size.  Pericardium: There is no evidence of pericardial effusion.  Mitral Valve: The mitral valve is normal in structure. No evidence of mitral valve regurgitation. No evidence of mitral valve stenosis.  Tricuspid Valve: The tricuspid valve is normal in structure. Tricuspid valve regurgitation is trivial. No evidence of tricuspid stenosis.  Aortic Valve: The aortic valve is tricuspid. There is mild calcification of the aortic valve. There is mild thickening of the aortic valve. Aortic valve regurgitation is not visualized. Aortic valve sclerosis is present, with no evidence of aortic valve stenosis.  Pulmonic Valve: The pulmonic valve was normal in structure. Pulmonic valve regurgitation is not visualized. No evidence of pulmonic stenosis.  Aorta: The aortic root is normal in size and structure.  Venous: The inferior vena cava is normal in size with greater than 50% respiratory variability, suggesting right atrial pressure of 3 mmHg.  IAS/Shunts: No atrial level shunt detected by color flow Doppler.   LEFT VENTRICLE PLAX 2D LVIDd:         3.90 cm   Diastology LVIDs:         2.90 cm   LV e' medial:    5.11 cm/s LV  PW:         1.00 cm   LV E/e' medial:  19.4 LV IVS:        0.90 cm   LV e' lateral:   6.74 cm/s LVOT diam:     2.10 cm   LV E/e' lateral: 14.7 LV SV:         86 LV SV Index:   34        2D Longitudinal Strain LVOT Area:     3.46 cm  2D Strain GLS (A2C):   -22.0 % 2D Strain GLS (A3C):   -25.8 % 2D Strain GLS (A4C):   -23.3 % 2D Strain GLS Avg:     -23.7 %  RIGHT VENTRICLE RV Basal diam:  3.40 cm RV S prime:     11.40 cm/s TAPSE (M-mode): 2.0 cm  LEFT ATRIUM             Index        RIGHT ATRIUM            Index LA diam:        4.30 cm 1.72 cm/m   RA Area:     12.60 cm LA Vol (A2C):   65.6 ml 26.27 ml/m  RA Volume:   25.90 ml  10.37 ml/m LA Vol (A4C):   50.5 ml 20.22 ml/m LA Biplane Vol: 58.7 ml 23.50 ml/m AORTIC VALVE LVOT Vmax:   115.00 cm/s LVOT Vmean:  82.800 cm/s LVOT VTI:    0.247 m  AORTA Ao Root diam: 3.50 cm Ao Asc diam:  3.50 cm  MITRAL VALVE MV Area (PHT):              SHUNTS MV Decel Time:              Systemic VTI:  0.25 m MV E velocity: 99.00 cm/s   Systemic Diam: 2.10 cm MV A velocity: 133.00 cm/s MV E/A ratio:  0.74  Candee Furbish MD Electronically signed by Candee Furbish MD Signature Date/Time: 02/27/2022/11:45:09 AM    Final     NM Stress Testing : Date: 11/08/2015 Results: Nuclear stress EF: 47%. The left ventricular ejection fraction is mildly decreased (45-54%). There was no ST segment deviation noted during stress. This is a low risk study. There is no evidence of ischemia or previous infarction  Abdominal Aortic Duplex Date: 04/17/2019 Abdominal Aorta: There is evidence of abnormal dilatation of the distal  Abdominal aorta. The largest aortic measurement is 2.9 cm. The largest  aortic diameter remains essentially unchanged compared to prior exam.  Previous diameter measurement was 3.0 cm  obtained on 06/2016.   Left/Right Heart Catheterizations: [id number] Results: Mild obstructive only per chart review.   Recent Labs: 10/31/2021: Magnesium 1.9 01/24/2022: Pro B Natriuretic peptide (BNP) 15.0 02/26/2022: ALT 24; BUN 15; Creatinine, Ser 1.45; Hemoglobin 15.2; Platelets 195.0; Potassium 3.7; Sodium 141; TSH 3.95  Recent Lipid Panel    Component Value Date/Time   CHOL 147 08/15/2018 1001   TRIG 194 (H) 08/15/2018 1001   HDL 54 08/15/2018 1001   CHOLHDL 2.7 08/15/2018 1001   CHOLHDL 5.2 (H) 09/07/2015 1802   VLDL NOT CALC 09/07/2015 1802   LDLCALC 54 08/15/2018 1001   Physical Exam:    VS:  BP 100/60   Pulse 66   Ht 6'  (1.829 m)   Wt (!) 305 lb (138.3 kg)   SpO2 96%   BMI 41.37 kg/m     Wt Readings from Last 3 Encounters:  03/06/22 (!) 305 lb (138.3 kg)  02/26/22 (!) 305 lb (138.3 kg)  02/16/22 300 lb (136.1 kg)    Gen: No distress, Morbid obese Neck: No JVD, no carotid bruit Cardiac: No Rubs or Gallops, soft systolic murmur normal rhythm, +2 radial pulses Respiratory: Clear to auscultation bilaterally, normal effort, normal  respiratory rate GI: Soft, nontender, non-distended  MS: +1 edema;  moves all extremities Integument: Skin feels warm Neuro:  At time of evaluation, alert and oriented to person/place/time/situation  Psych: Normal affect, patient feels   ASSESSMENT:    1. 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 Hypotension (relative, patients amb monitor is usually 10 mm Hg higher than ours) - had normal wedge on RHC 07/18/21 - Diuretic: Torsemide and Jardiance , had diarrhea on MRA that resolved with stopping will not rechallenge - unprovoked PE with no evidence of CTEPH on V/Q scan; he would like to stop Xarelto, unless his PE was related to COVID I would not change; he has an upcoming hematology visit to discuss this - CAD s/p PCI, on diltiazem 300 mg PO daily for query of microvascular disease; given his concomittant COPD hx we will stop his metoprolol; if CP or palpitations increase diltiazem to 360 - Aortic athersclerosis, LDL goal < 55 - plavix and Xarelto for 06/2021 PCI; if OK with heme can drop to just plavix - will get sleep study, he notes his device titrates itself his last IPAP was 12 mm Hg he has not seen anyone for this in years  AAA -  ULN; will repeat imaging in 2024 or 2025   Three months with me  Time Spent Directly with Patient:   I have spent a total of 40 minutes with the patient reviewing notes, imaging,  EKGs, labs and examining the patient as well as establishing an assessment and plan that was discussed personally with the patient.  > 50% of time was spent in direct patient care and wife and reviewing imaging with patient.    Medication Adjustments/Labs and Tests Ordered: Current medicines are reviewed at length with the patient today.  Concerns regarding medicines are outlined above.  Orders Placed This Encounter  Procedures   Ambulatory referral to Sleep Studies     No orders of the defined types were placed in this encounter.    Patient Instructions  Medication Instructions:  Your physician has recommended you make the following change in your medication:  STOP: metoprolol succinate  *If you need a refill on your cardiac medications before your next appointment, please call your pharmacy*   Lab Work: NONE If you have labs (blood work) drawn today and your tests are completely normal, you will receive your results only by: [place] (if you have MyChart) OR A paper copy in the mail If you have any lab test that is abnormal or we need to change your treatment, we will call you to review the results.   Testing/Procedures: Your physician has referred you to see a Sleep Specialist.   Follow-Up: At [facility], you and your health needs are our priority.  As part of our continuing mission to provide you with exceptional heart care, we have created designated Provider Care Teams.  These Care Teams include your primary Cardiologist (physician) and Advanced Practice Providers (APPs -  Physician Assistants and Nurse Practitioners)  who all work together to provide you with the care you need, when you need it.    Your next appointment:   3-4 month(s)  The format for your next appointment:   In Person  Provider:   Werner Lean, MD     Important Information About Sugar         Signed, Werner Lean, MD  03/06/2022 12:37 PM    [place]

## 2022-03-06 ENCOUNTER — Encounter: Payer: Medicare Other | Attending: Internal Medicine | Admitting: Internal Medicine

## 2022-03-06 ENCOUNTER — Encounter: Payer: Self-pay | Admitting: Internal Medicine

## 2022-03-06 VITALS — BP 100/60 | HR 66 | Ht 72.0 in | Wt 305.0 lb

## 2022-03-06 DIAGNOSIS — R0609 Other forms of dyspnea: Secondary | ICD-10-CM | POA: Diagnosis present

## 2022-03-06 DIAGNOSIS — G4733 Obstructive sleep apnea (adult) (pediatric): Secondary | ICD-10-CM | POA: Diagnosis present

## 2022-03-06 DIAGNOSIS — I2510 Atherosclerotic heart disease of native coronary artery without angina pectoris: Secondary | ICD-10-CM | POA: Diagnosis present

## 2022-03-06 DIAGNOSIS — I700 Atherosclerosis of aorta: Secondary | ICD-10-CM | POA: Diagnosis present

## 2022-03-06 DIAGNOSIS — I5030 Unspecified diastolic (congestive) heart failure: Secondary | ICD-10-CM | POA: Insufficient documentation

## 2022-03-06 DIAGNOSIS — I714 Abdominal aortic aneurysm, without rupture, unspecified: Secondary | ICD-10-CM | POA: Insufficient documentation

## 2022-03-06 NOTE — Patient Instructions (Signed)
Medication Instructions:  Your physician has recommended you make the following change in your medication:  STOP: metoprolol succinate  *If you need a refill on your cardiac medications before your next appointment, please call your pharmacy*   Lab Work: NONE If you have labs (blood work) drawn today and your tests are completely normal, you will receive your results only by: [place] (if you have MyChart) OR A paper copy in the mail If you have any lab test that is abnormal or we need to change your treatment, we will call you to review the results.   Testing/Procedures: Your physician has referred you to see a Sleep Specialist.   Follow-Up: At [facility], you and your health needs are our priority.  As part of our continuing mission to provide you with exceptional heart care, we have created designated Provider Care Teams.  These Care Teams include your primary Cardiologist (physician) and Advanced Practice Providers (APPs -  Physician Assistants and Nurse Practitioners) who all work together to provide you with the care you need, when you need it.    Your next appointment:   3-4 month(s)  The format for your next appointment:   In Person  Provider:   Werner Lean, MD     Important Information About Sugar

## 2022-03-07 ENCOUNTER — Other Ambulatory Visit: Payer: Self-pay | Admitting: *Deleted

## 2022-03-07 MED ORDER — DILTIAZEM HCL ER COATED BEADS 300 MG PO CP24: 300.0000 mg | ORAL_CAPSULE | Freq: Every morning | ORAL | 1 refills | Status: DC

## 2022-03-07 NOTE — Telephone Encounter (Signed)
Ok to send in.  Algis Greenhouse. Jerline Pain, MD 03/07/2022 7:31 AM

## 2022-03-12 ENCOUNTER — Ambulatory Visit: Payer: Medicare Other | Admitting: Family Medicine

## 2022-03-23 ENCOUNTER — Other Ambulatory Visit: Payer: Self-pay | Admitting: Family Medicine

## 2022-03-29 ENCOUNTER — Ambulatory Visit: Payer: Medicare Other | Admitting: Hematology and Oncology

## 2022-03-29 ENCOUNTER — Other Ambulatory Visit: Payer: Medicare Other

## 2022-04-24 ENCOUNTER — Other Ambulatory Visit: Payer: Self-pay | Admitting: Hematology and Oncology

## 2022-04-24 DIAGNOSIS — Z86711 Personal history of pulmonary embolism: Secondary | ICD-10-CM

## 2022-04-25 ENCOUNTER — Inpatient Hospital Stay: Payer: Medicare Other | Attending: Hematology and Oncology

## 2022-04-25 ENCOUNTER — Other Ambulatory Visit: Payer: Medicare Other

## 2022-04-25 ENCOUNTER — Other Ambulatory Visit: Payer: Self-pay

## 2022-04-25 ENCOUNTER — Inpatient Hospital Stay (HOSPITAL_BASED_OUTPATIENT_CLINIC_OR_DEPARTMENT_OTHER): Payer: Medicare Other | Admitting: Hematology and Oncology

## 2022-04-25 ENCOUNTER — Ambulatory Visit: Payer: Medicare Other | Admitting: Hematology and Oncology

## 2022-04-25 VITALS — BP 111/64 | HR 65 | Temp 97.7°F | Resp 15 | Wt 298.8 lb

## 2022-04-25 DIAGNOSIS — Z86711 Personal history of pulmonary embolism: Secondary | ICD-10-CM

## 2022-04-25 DIAGNOSIS — I2699 Other pulmonary embolism without acute cor pulmonale: Secondary | ICD-10-CM | POA: Diagnosis not present

## 2022-04-25 DIAGNOSIS — Z7901 Long term (current) use of anticoagulants: Secondary | ICD-10-CM | POA: Diagnosis not present

## 2022-04-25 DIAGNOSIS — I2782 Chronic pulmonary embolism: Secondary | ICD-10-CM

## 2022-04-25 LAB — CMP (CANCER CENTER ONLY)
ALT: 28 U/L (ref 0–44)
AST: 36 U/L (ref 15–41)
Albumin: 4.1 g/dL (ref 3.5–5.0)
Alkaline Phosphatase: 47 U/L (ref 38–126)
Anion gap: 11 (ref 5–15)
BUN: 18 mg/dL (ref 8–23)
CO2: 30 mmol/L (ref 22–32)
Calcium: 8.9 mg/dL (ref 8.9–10.3)
Chloride: 103 mmol/L (ref 98–111)
Creatinine: 1.47 mg/dL — ABNORMAL HIGH (ref 0.61–1.24)
GFR, Estimated: 52 mL/min — ABNORMAL LOW (ref >60)
Glucose, Bld: 155 mg/dL — ABNORMAL HIGH (ref 70–99)
Potassium: 3.8 mmol/L (ref 3.5–5.1)
Sodium: 144 mmol/L (ref 135–145)
Total Bilirubin: 0.6 mg/dL (ref 0.3–1.2)
Total Protein: 7 g/dL (ref 6.5–8.1)

## 2022-04-25 LAB — CBC WITH DIFFERENTIAL (CANCER CENTER ONLY)
Abs Immature Granulocytes: 0.01 10*3/uL (ref 0.00–0.07)
Basophils Absolute: 0 10*3/uL (ref 0.0–0.1)
Basophils Relative: 0 %
Eosinophils Absolute: 0.1 10*3/uL (ref 0.0–0.5)
Eosinophils Relative: 2 %
HCT: 44.5 % (ref 39.0–52.0)
Hemoglobin: 15.1 g/dL (ref 13.0–17.0)
Immature Granulocytes: 0 %
Lymphocytes Relative: 31 %
Lymphs Abs: 1.6 10*3/uL (ref 0.7–4.0)
MCH: 33.2 pg (ref 26.0–34.0)
MCHC: 33.9 g/dL (ref 30.0–36.0)
MCV: 97.8 fL (ref 80.0–100.0)
Monocytes Absolute: 0.4 10*3/uL (ref 0.1–1.0)
Monocytes Relative: 8 %
Neutro Abs: 3.1 10*3/uL (ref 1.7–7.7)
Neutrophils Relative %: 59 %
Platelet Count: 169 10*3/uL (ref 150–400)
RBC: 4.55 MIL/uL (ref 4.22–5.81)
RDW: 13.2 % (ref 11.5–15.5)
WBC Count: 5.3 10*3/uL (ref 4.0–10.5)
nRBC: 0 % (ref 0.0–0.2)

## 2022-04-25 NOTE — Progress Notes (Signed)
[place] Telephone:(336) [phone]   [phone]  PROGRESS NOTE  Patient Care Team: Vivi Barrack, MD as PCP - General (Family Medicine) Werner Lean, MD as PCP - Cardiology (Cardiology) Belva Crome, MD as Consulting Physician (Cardiology) Milus Banister, MD as Attending Physician (Gastroenterology) Brand Males, MD as Consulting Physician (Pulmonary Disease) Druscilla Brownie, MD as Consulting Physician (Dermatology) Mosetta Anis, MD as Referring Physician (Allergy) Rexene Agent, MD as Attending Physician (Nephrology) Thornell Sartorius, MD as Consulting Physician (Otolaryngology) Christain [place], [place] as Referring Physician (Optometry) Ulice Brilliant, MD as Referring Physician (Internal Medicine)  Hematological/Oncological History # Unprovoked RLL Segmental Pulmonary Embolism 12/16/2020: CT Angio chest showed a segmental pulmonary thromboembolism in the right lower lobe. No evidence of right heart strain. Started on Xarelto '20mg'$  PO daily.  02/16/2021: establish care with Dr. Lorenso Courier 06/28/2021: re-evaluated to consider dosage change of Xarelto. Patient has persistent shortness of breath. Will continue Xaretlo '20mg'$  PO daily.   08/14/2021: Transition to 10 mg p.o. Xarelto daily.  Interval History:  [name] 66 y.o. male with medical history significant for unprovoked pulmonary embolism who presents for a follow up visit. The patient's last visit was on 09/27/2021. In the interim since the last visit he has continued on Xarelto therapy without difficulty.    On exam today [name] reports he has been well overall interim since her last visit.  He notes that he does not feel as stable on his feet as he has previously and he did recently fall and hit his head.  He notes that he is also bleeding quite easily.  He is not having any headache or vision changes after his fall.  He notes that he does have some bone-on-bone pain in his neck and  has been try to do his best to decrease self-medication with alcohol.  He notes that he is looking for alternatives.  He notes that he continues to follow with his pulmonologist.  He also expresses a desire to get off the Xarelto medication.  He would prefer to transition to baby aspirin 81 mg p.o. daily. He denies any chest pain.  He reports no fevers, chills, sweats, nausea, vomiting or diarrhea.  Full 10 point ROS is listed below.   MEDICAL HISTORY:  Past Medical History:  Diagnosis Date   Abdominal aortic aneurysm (AAA), 30-34 mm diameter ([place]) 06/01/2017   Nml w/ greatest dm 3 cm US Aorta 06/2016, Abd [place] 05/01/2017 showed infrarenal dilatation at 3.4 cm - rec repeat US in 3 yrs.   Arthritis    Cervical disc disease 12/26/2011   Coronary artery disease involving native coronary artery of native heart without angina pectoris 12/01/2016   He had coronary angiography in 2011. There was irregularity within the LAD with up to 30% narrowing. The myocardial perfusion imaging done 11/07/2015 did not demonstrate any evidence of ischemia with a low risk nuclear stress test other than EF estimated at 47%. Chest CT 01/25/2016 showed diffuse coronary artery calcifications with heavy calcifications in the LAD. Pt seen by cardiology Dr. Linard Millers    Diabetes mellitus    Difficult intubation 04/05/2014   Dyslipidemia    Family history of anesthesia complication    pt states took days for his father to awaken after mask was used    Fatty liver 06/01/2017   [place] 05/01/2017   GERD (gastroesophageal reflux disease)    has been having acid reflux since recent endoscopy    H/O hiatal  hernia    repair with lap band, now has ventral hernia midline abd   Hearing loss-aides 03/26/2012   Hyperlipidemia    Hypertension    Lapband APS + hiatal hernia repair Oct 2015 04/05/2014   Morbid obesity (HCC)    OSA (obstructive sleep apnea)    Rotator cuff tear    Seasonal allergies    Shortness of breath    pt states related  to high BP meds with extended walking or climbling stairs   Sleep apnea     SURGICAL HISTORY: Past Surgical History:  Procedure Laterality Date   CARDIAC CATHETERIZATION     approx 3 years ago    COLONOSCOPY WITH PROPOFOL N/A 03/10/2015   Procedure: COLONOSCOPY WITH PROPOFOL;  Surgeon: Milus Banister, MD;  Location: [facility];  Service: Endoscopy;  Laterality: N/A;   colonscopy      2012   CORONARY ATHERECTOMY N/A 07/18/2021   Procedure: CORONARY ATHERECTOMY;  Surgeon: Jettie Booze, MD;  Location: [place] [facility];  Service: Cardiovascular;  Laterality: N/A;   CORONARY STENT INTERVENTION N/A 07/18/2021   Procedure: CORONARY STENT INTERVENTION;  Surgeon: Jettie Booze, MD;  Location: [place] [facility];  Service: Cardiovascular;  Laterality: N/A;   ESOPHAGOGASTRODUODENOSCOPY (EGD) WITH PROPOFOL N/A 03/10/2015   Procedure: ESOPHAGOGASTRODUODENOSCOPY (EGD) WITH PROPOFOL;  Surgeon: Milus Banister, MD;  Location: [facility];  Service: Endoscopy;  Laterality: N/A;   HERNIA REPAIR     with gastric banding   LAPAROSCOPIC GASTRIC BANDING N/A 04/05/2014   Procedure: LAPAROSCOPIC GASTRIC BANDING;  Surgeon: Pedro Earls, MD;  Location: [facility];  Service: General;  Laterality: N/A;   MENISCUS REPAIR Left    left knee torn meniscus   RIGHT/LEFT HEART CATH AND CORONARY ANGIOGRAPHY N/A 07/18/2021   Procedure: RIGHT/LEFT HEART CATH AND CORONARY ANGIOGRAPHY;  Surgeon: Jettie Booze, MD;  Location: [place] [facility];  Service: Cardiovascular;  Laterality: N/A;   VASECTOMY      SOCIAL HISTORY: Social History   Socioeconomic History   Marital status: Married    Spouse name: Not on file   Number of children: 2   Years of education: 16   Highest education level: Bachelor's degree (e.g., BA, AB, BS)  Occupational History   Occupation: owner    Comment: Associate Professor   Occupation: Retired  Tobacco Use   Smoking status: Former    Packs/day: 4.00    Years: 20.00     Total pack years: 80.00    Types: Cigarettes    Start date: 07/19/1973    Quit date: 12/13/1991    Years since quitting: 30.3   Smokeless tobacco: Never  Vaping Use   Vaping Use: Never used  Substance and Sexual Activity   Alcohol use: Yes    Alcohol/week: 3.0 - 5.0 standard drinks of alcohol    Types: 3 - 5 Glasses of wine per week    Comment: takes 3 to 5 glasses of wine nightly    Drug use: No   Sexual activity: Not on file  Other Topics Concern   Not on file  Social History Narrative   Not on file   Social Determinants of Health   Financial Resource Strain: Not on file  Food Insecurity: Not on file  Transportation Needs: Not on file  Physical Activity: Not on file  Stress: Not on file  Social Connections: Not on file  Intimate Partner Violence: Not on file    FAMILY HISTORY: Family History  Problem  Relation Age of Onset   Clotting disorder Mother        lung clot   Mesothelioma Father    Hypertension Brother    Skin cancer Brother    Colon cancer Neg Hx     ALLERGIES:  is allergic to farxiga [dapagliflozin] and penicillins.  MEDICATIONS:  Current Outpatient Medications  Medication Sig Dispense Refill   acetaminophen (TYLENOL) 500 MG tablet Take 500 mg by mouth every 6 (six) hours as needed (for pain.).     albuterol (VENTOLIN HFA) 108 (90 Base) MCG/ACT inhaler Inhale 2 puffs into the lungs every 6 (six) hours as needed for wheezing or shortness of breath (take as needed for SOB/wheezing). 8 g 5   clopidogrel (PLAVIX) 75 MG tablet Take 1 tablet (75 mg total) by mouth daily with breakfast. 90 tablet 3   diltiazem (CARDIZEM CD) 300 MG 24 hr capsule Take 1 capsule (300 mg total) by mouth in the morning. 90 capsule 1   diphenhydrAMINE (BENADRYL) 25 MG tablet Take 25 mg by mouth every 6 (six) hours as needed for itching.     empagliflozin (JARDIANCE) 10 MG TABS tablet Take 1 tablet (10 mg total) by mouth daily before breakfast. 90 tablet 3   fluticasone (FLONASE) 50  MCG/ACT nasal spray Place into the nose as needed.     Fluticasone-Umeclidin-Vilant (TRELEGY ELLIPTA) 200-62.5-25 MCG/ACT AEPB Inhale 200 mcg into the lungs daily. 1 each 5   glucose blood (TRUETEST TEST) test strip Use as instructed 100 each 2   Insulin Pen Needle (B-D UF III MINI PEN NEEDLES) 31G X 5 MM MISC USE AS DIRECTED ONCE A DAY 100 each 11   ketoconazole (NIZORAL) 2 % cream 2 (two) times a week.     lisinopril (ZESTRIL) 20 MG tablet Take 1 tablet (20 mg total) by mouth daily. 90 tablet 3   Multiple Vitamin (MULTIVITAMIN WITH MINERALS) TABS tablet Take 1 tablet by mouth in the morning.     Potassium Chloride ER 20 MEQ TBCR Take 60 mEq by mouth daily. 180 tablet 3   rivaroxaban (XARELTO) 10 MG TABS tablet Take 1 tablet (10 mg total) by mouth daily. 30 tablet 3   rosuvastatin (CRESTOR) 40 MG tablet Take 1 tablet (40 mg total) by mouth daily. 90 tablet 3   silver sulfADIAZINE (SSD) 1 % cream 2 (two) times a week.     tamsulosin (FLOMAX) 0.4 MG CAPS capsule TAKE ONE CAPSULE BY MOUTH 30 minutes after SAME MEAL EACH DAY 90 capsule 0   torsemide (DEMADEX) 20 MG tablet Take 3 tablets (60 mg total) by mouth daily. 270 tablet 3   TRESIBA FLEXTOUCH 200 UNIT/ML FlexTouch Pen INJECT 56 UNITS EVERY MORNING AND 56 UNITS EVERY EVENING INTO THE SKIN 54 mL 0   No current facility-administered medications for this visit.    REVIEW OF SYSTEMS:   Constitutional: ( - ) fevers, ( - )  chills , ( - ) night sweats Eyes: ( - ) blurriness of vision, ( - ) double vision, ( - ) watery eyes Ears, nose, mouth, throat, and face: ( - ) mucositis, ( - ) sore throat Respiratory: ( - ) cough, ( - ) dyspnea, ( - ) wheezes Cardiovascular: ( - ) palpitation, ( - ) chest discomfort, ( - ) lower extremity swelling Gastrointestinal:  ( - ) nausea, ( - ) heartburn, ( - ) change in bowel habits Skin: ( - ) abnormal skin rashes Lymphatics: ( - ) new lymphadenopathy, ( - )  easy bruising Neurological: ( - ) numbness, ( - )  tingling, ( - ) new weaknesses Behavioral/Psych: ( - ) mood change, ( - ) new changes  All other systems were reviewed with the patient and are negative.  PHYSICAL EXAMINATION:  Vitals:   04/25/22 1432  BP: 111/64  Pulse: 65  Resp: 15  Temp: 97.7 F (36.5 C)  SpO2: 94%   Filed Weights   04/25/22 1432  Weight: 298 lb 12.8 oz (135.5 kg)    GENERAL: well appearing elderly Caucasian male, in NAD  SKIN: skin color, texture, turgor are normal, no rashes or significant lesions EYES: conjunctiva are pink and non-injected, sclera clear LUNGS: clear to auscultation and percussion with normal breathing effort HEART: regular rate & rhythm and no murmurs and no lower extremity edema Musculoskeletal: no cyanosis of digits and no clubbing  PSYCH: alert & oriented x 3, fluent speech NEURO: no focal motor/sensory deficits    LABORATORY DATA:  I have reviewed the data as listed    Latest Ref Rng & Units 04/25/2022    2:05 PM 02/26/2022   10:35 AM 01/24/2022    4:20 PM  CBC  WBC 4.0 - 10.5 K/uL 5.3  5.3  5.6   Hemoglobin 13.0 - 17.0 g/dL 15.1  15.2  13.9   Hematocrit 39.0 - 52.0 % 44.5  45.2  41.4   Platelets 150 - 400 K/uL 169  195.0  193.0        Latest Ref Rng & Units 04/25/2022    2:05 PM 02/26/2022   10:35 AM 01/24/2022    4:20 PM  CMP  Glucose 70 - 99 mg/dL 155  119  161   BUN 8 - 23 mg/dL '18  15  20   '$ Creatinine 0.61 - 1.24 mg/dL 1.47  1.45  1.57   Sodium 135 - 145 mmol/L 144  141  138   Potassium 3.5 - 5.1 mmol/L 3.8  3.7  3.8   Chloride 98 - 111 mmol/L 103  98  99   CO2 22 - 32 mmol/L '30  31  29   '$ Calcium 8.9 - 10.3 mg/dL 8.9  9.1  8.9   Total Protein 6.5 - 8.1 g/dL 7.0  7.2    Total Bilirubin 0.3 - 1.2 mg/dL 0.6  0.5    Alkaline Phos 38 - 126 U/L 47  55    AST 15 - 41 U/L 36  31    ALT 0 - 44 U/L 28  24      No results found for: "MPROTEIN"  RADIOGRAPHIC STUDIES: No results found.  ASSESSMENT & PLAN [name] 66 y.o. male with medical history significant  for unprovoked pulmonary embolism who presents for a follow up visit.   After review of the labs, review of the records, and discussion with the patient the patients findings are most consistent with incidental finding of a segmental pulmonary thromboembolism in the right lower lobe.  The patient does travel a great deal and he is overweight which may be contributing to his blood clot, however these are not necessarily modifiable risk factors.  Given the incidental nature of this clot is unclear if this represents a provoked or unprovoked VTE.  In the interest of being cautious I would recommend we treat this as an unprovoked.  Today we had a detailed discussion regarding maintenance Xarelto versus baby aspirin therapy.  I have noted they have been studied head-to-head and aspirin has been shown to be inferior  in preventing blood clots.  The patient reports that he strongly dislikes taking Xarelto and feels like there may be a danger with bleeding.  We discussed the risks and benefits of Xarelto therapy moving forward and the patient notes he would like to consider transitioning to aspirin therapy.  In the event he transitions to aspirin there will be no need for further routine follow-up in our clinic.  He notes he would like a placeholder appointment in 6 months time and we will plan to see him back if he would like to continue Xarelto.    # Unprovoked RLL Segmental Pulmonary Embolism -- Findings are consistent with an unprovoked right lower lobe segmental pulmonary embolism --Given this finding would recommend indefinite anticoagulation --Continue treatment with Xarelto 10 mg p.o. daily.   --Patient is currently considering transition to aspirin 81 mg p.o. daily, discussed with him that this is considered inferior to Xarelto in the setting of an unprovoked VTE.  He voices understanding and would like to consider this moving forward. --Labs are currently appropriate for continuation of anticoagulation  therapy.  Hemoglobin 15.1, white blood cell count 5.3, MCV 97.8, and platelets of 169. --Return to clinic in 6 months time to continue monitoring   No orders of the defined types were placed in this encounter.   All questions were answered. The patient knows to call the clinic with any problems, questions or concerns.  A total of more than 30 minutes were spent on this encounter with face-to-face time and non-face-to-face time, including preparing to see the patient, ordering tests and/or medications, counseling the patient and coordination of care as outlined above.   Ledell Peoples, MD Department of Hematology/Oncology [place] [facility] Phone: [phone] Pager: [phone] Email: [email or web address]  04/26/2022 9:00 AM

## 2022-05-08 ENCOUNTER — Encounter: Payer: Self-pay | Admitting: Hematology and Oncology

## 2022-05-21 ENCOUNTER — Telehealth: Payer: Self-pay

## 2022-05-21 NOTE — Telephone Encounter (Addendum)
Covering preop today. Needs further details on what is being done. If anything under than a cataract surgery, this be safely deferred until after follow-up in 07/09/22 at which time Dr. Gasper Sells was contemplating stopping Plavix altogether? Please also let surgeon know that we do not manage his Xarelto - sees heme-onc for this.

## 2022-05-21 NOTE — Telephone Encounter (Signed)
..     Pre-operative Risk Assessment    Patient Name: [name]  DOB: Mar 31, 1956 MRN: [id number]      Request for Surgical Clearance    Procedure:   left eye and right eye  Date of Surgery:  Clearance 05/29/22  and 06/05/22                               Surgeon:  DR Vevelyn Royals Surgeon's Group or Practice Name:  [facility] Phone number:  [phone] Fax number:  [phone]   Type of Clearance Requested:   - Medical  - Pharmacy:  Hold Clopidogrel (Plavix) and Rivaroxaban (Xarelto)     Type of Anesthesia:  Not Indicated   Additional requests/questions:    Gwenlyn Found   05/21/2022, 2:48 PM

## 2022-05-22 NOTE — Telephone Encounter (Signed)
   Patient Name: [name]  DOB: [birth year] MRN: [id number]  Primary Cardiologist: Werner Lean, MD  Chart reviewed as part of pre-operative protocol coverage. Cataract extractions are recognized in guidelines as low risk surgeries that do not typically require specific preoperative testing or holding of blood thinner therapy. Therefore, given past medical history and time since last visit, based on ACC/AHA guidelines, [name] would be at acceptable risk for the planned procedure without further cardiovascular testing. FYI to surgical team - we also do not manage this patient's Xarelto so if there are questions going forward about this medicine, recommend they reach out to heme-onc. We do not typically hold Plavix for cataract surgery.  I will route this recommendation to the requesting party via Epic fax function and remove from pre-op pool.  Please call with questions.  Charlie Pitter, PA-C 05/22/2022, 4:20 PM

## 2022-05-22 NOTE — Telephone Encounter (Signed)
I s/w Dr. Lucita Ferrara office and confirmed the procedure is:   CATARACT SURGERY, LEFT EYE 12/23 AND RIGHT EYE 06/05/22 USING IV SEDATION

## 2022-05-24 NOTE — Telephone Encounter (Signed)
Re-faxed clearance via EPIC fax and manual fax

## 2022-05-24 NOTE — Telephone Encounter (Signed)
[facility] called stating they haven't received clearance yet. If it can be resent to them.

## 2022-05-28 ENCOUNTER — Encounter: Payer: Self-pay | Admitting: Family Medicine

## 2022-05-28 ENCOUNTER — Ambulatory Visit (INDEPENDENT_AMBULATORY_CARE_PROVIDER_SITE_OTHER): Payer: Medicare Other | Admitting: Family Medicine

## 2022-05-28 ENCOUNTER — Ambulatory Visit: Payer: Medicare Other | Admitting: Physical Medicine and Rehabilitation

## 2022-05-28 VITALS — BP 107/70 | HR 71 | Temp 97.7°F | Ht 72.0 in | Wt 296.2 lb

## 2022-05-28 DIAGNOSIS — Z79899 Other long term (current) drug therapy: Secondary | ICD-10-CM

## 2022-05-28 DIAGNOSIS — R5383 Other fatigue: Secondary | ICD-10-CM | POA: Diagnosis not present

## 2022-05-28 DIAGNOSIS — M539 Dorsopathy, unspecified: Secondary | ICD-10-CM | POA: Diagnosis not present

## 2022-05-28 DIAGNOSIS — R0609 Other forms of dyspnea: Secondary | ICD-10-CM | POA: Diagnosis not present

## 2022-05-28 DIAGNOSIS — Z9884 Bariatric surgery status: Secondary | ICD-10-CM

## 2022-05-28 DIAGNOSIS — E1159 Type 2 diabetes mellitus with other circulatory complications: Secondary | ICD-10-CM

## 2022-05-28 DIAGNOSIS — Z86711 Personal history of pulmonary embolism: Secondary | ICD-10-CM

## 2022-05-28 DIAGNOSIS — E1165 Type 2 diabetes mellitus with hyperglycemia: Secondary | ICD-10-CM | POA: Diagnosis not present

## 2022-05-28 DIAGNOSIS — I152 Hypertension secondary to endocrine disorders: Secondary | ICD-10-CM

## 2022-05-28 DIAGNOSIS — M25561 Pain in right knee: Secondary | ICD-10-CM

## 2022-05-28 DIAGNOSIS — E559 Vitamin D deficiency, unspecified: Secondary | ICD-10-CM

## 2022-05-28 DIAGNOSIS — M25562 Pain in left knee: Secondary | ICD-10-CM

## 2022-05-28 DIAGNOSIS — Z1211 Encounter for screening for malignant neoplasm of colon: Secondary | ICD-10-CM

## 2022-05-28 DIAGNOSIS — U099 Post covid-19 condition, unspecified: Secondary | ICD-10-CM

## 2022-05-28 LAB — CBC
HCT: 44.9 % (ref 39.0–52.0)
Hemoglobin: 15.3 g/dL (ref 13.0–17.0)
MCHC: 34 g/dL (ref 30.0–36.0)
MCV: 95.6 fl (ref 78.0–100.0)
Platelets: 233 10*3/uL (ref 150.0–400.0)
RBC: 4.69 Mil/uL (ref 4.22–5.81)
RDW: 13.7 % (ref 11.5–15.5)
WBC: 5.4 10*3/uL (ref 4.0–10.5)

## 2022-05-28 LAB — COMPREHENSIVE METABOLIC PANEL
ALT: 29 U/L (ref 0–53)
AST: 30 U/L (ref 0–37)
Albumin: 4.4 g/dL (ref 3.5–5.2)
Alkaline Phosphatase: 57 U/L (ref 39–117)
BUN: 16 mg/dL (ref 6–23)
CO2: 34 mEq/L — ABNORMAL HIGH (ref 19–32)
Calcium: 9.2 mg/dL (ref 8.4–10.5)
Chloride: 99 mEq/L (ref 96–112)
Creatinine, Ser: 1.51 mg/dL — ABNORMAL HIGH (ref 0.40–1.50)
GFR: 47.59 mL/min — ABNORMAL LOW (ref >60.00)
Glucose, Bld: 94 mg/dL (ref 70–99)
Potassium: 4.3 mEq/L (ref 3.5–5.1)
Sodium: 143 mEq/L (ref 135–145)
Total Bilirubin: 0.5 mg/dL (ref 0.2–1.2)
Total Protein: 6.7 g/dL (ref 6.0–8.3)

## 2022-05-28 LAB — VITAMIN B12: Vitamin B-12: 420 pg/mL (ref 211–911)

## 2022-05-28 LAB — VITAMIN D 25 HYDROXY (VIT D DEFICIENCY, FRACTURES): VITD: 22.89 ng/mL — ABNORMAL LOW (ref 30.00–100.00)

## 2022-05-28 LAB — TSH: TSH: 2.18 u[IU]/mL (ref 0.35–5.50)

## 2022-05-28 MED ORDER — TRESIBA FLEXTOUCH 200 UNIT/ML ~~LOC~~ SOPN: 114.0000 [IU] | PEN_INJECTOR | Freq: Every day | SUBCUTANEOUS | 0 refills | Status: DC

## 2022-05-28 NOTE — Assessment & Plan Note (Signed)
Blood pressure at goal today on current regimen lisinopril 20 mg daily, metoprolol succinate 25 mg daily, and diltiazem 300 mg daily.

## 2022-05-28 NOTE — Assessment & Plan Note (Signed)
He has been compliant with his medications without any significant side effects.  Check A1c today.  Continue tresiba 114 units daily and Jardiance 10 mg daily.

## 2022-05-28 NOTE — Patient Instructions (Signed)
It was very nice to see you today!  Will check blood work today.  I will refer you to see a surgeon to remove the Lap-Band  We will look for any causes of your fatigue and the blood work.  Please make sure that she continue to use your CPAP.  Please try using a fiber supplement to help with your diarrhea.  We will check x-rays of your knees.  I will see back in 3 months.  Come back to see me sooner if needed.  Take care, Dr Jerline Pain  PLEASE NOTE:  If you had any lab tests please let us know if you have not heard back within a few days. You may see your results on mychart before we have a chance to review them but we will give you a call once they are reviewed by [place]. If we ordered any referrals today, please let us know if you have not heard from their office within the next week.   Please try these tips to maintain a healthy lifestyle:  Eat at least 3 REAL meals and 1-2 snacks per day.  Aim for no more than 5 hours between eating.  If you eat breakfast, please do so within one hour of getting up.   Each meal should contain half fruits/vegetables, one quarter protein, and one quarter carbs (no bigger than a computer mouse)  Cut down on sweet beverages. This includes juice, soda, and sweet tea.   Drink at least 1 glass of water with each meal and aim for at least 8 glasses per day  Exercise at least 150 minutes every week.

## 2022-05-28 NOTE — Assessment & Plan Note (Signed)
Still has some shortness of breath with this seems to be improving.  Will continue management per pulmonology.

## 2022-05-28 NOTE — Assessment & Plan Note (Signed)
Patient wishes for removal.  Will place referral for him to follow back up with surgery.

## 2022-05-28 NOTE — Assessment & Plan Note (Signed)
He stopped his Xarelto.  Now only on aspirin and Plavix.

## 2022-05-28 NOTE — Progress Notes (Signed)
[name] is a 66 y.o. male who presents today for an office visit.  Assessment/Plan:  New/Acute Problems: Other Fatigue No red flags.  Likely multifactorial.  He has been compliant with his CPAP machine.  Will check labs today including CBC, c-Met, TSH, B12, and vitamin D.  Diarrhea No red flags.  Recommended fiber supplement such as Benefiber or Metamucil.  If this continues to be a recurrent issue will need referral to GI.  Chronic Problems Addressed Today: T2DM (type 2 diabetes mellitus) ([place]) He has been compliant with his medications without any significant side effects.  Check A1c today.  Continue tresiba 114 units daily and Jardiance 10 mg daily.  Hypertension associated with diabetes ([place]) Blood pressure at goal today on current regimen lisinopril 20 mg daily, metoprolol succinate 25 mg daily, and diltiazem 300 mg daily.  COVID-19 long hauler manifesting chronic dyspnea Still has some shortness of breath with this seems to be improving.  Will continue management per pulmonology.  Multilevel degenerative disc disease Still has significant mount of pain.  He has not followed up with physical therapy as directed.  Like to get x-rays done on his knees today.  We did discuss having him follow-up with his orthopedist to have this done however he would like to get them done before he goes back.  Place order today though will defer further management till he sees his orthopedist.  Recommended he follow-up with physical therapy as well.  History of pulmonary embolus (PE) He stopped his Xarelto.  Now only on aspirin and Plavix.  LAP-BAND surgery status Patient wishes for removal.  Will place referral for him to follow back up with surgery.     Subjective:  HPI:  See A/p for status of chronic conditions.    Patient here today for follow-up.  He was seen here 3 months ago.  At his last visit we had referred him to see an orthopedist for back issues.  He saw them once and  on the was referred to physical therapy.  He has not yet followed up with physical therapy though still has persistent pain in extremities and neck.  Since his last visit he has also followed up with oncology and cardiology.  He stopped taking Xarelto and is now only on antiplatelets.  He had an echocardiogram which showed normal EF.  He has been compliant with his insulin is now on 114 units [place] daily and Jardiance 10 mg daily.  He has been tolerating well.  He does have some ongoing issues with fatigue.  This has been going on for several months to years.  Usually happens in the middle the day.  Feels very tired.  He will usually recover afterwards.  No obvious exacerbating events.  No obvious aggravating or alleviating factors.  Has been compliant with his CPAP machine.  He has been compliant with his medications as well.  He is also has some issues with ongoing diarrhea for the last few days.  Did have some constipation previously.  This typically occurs for him a few times per year.  No reported bowel or bladder incontinence.  No reported hematochezia or melena.  No abdominal pain.       Objective:  Physical Exam: BP 107/70   Pulse 71   Temp 97.7 F (36.5 C) (Temporal)   Ht 6' (1.829 m)   Wt 296 lb 3.2 oz (134.4 kg)   SpO2 96%   BMI 40.17 kg/m   Gen: No acute distress, resting  comfortably CV: Regular rate and rhythm with no murmurs appreciated Pulm: Normal work of breathing, clear to auscultation bilaterally with no crackles, wheezes, or rhonchi Neuro: Grossly normal, moves all extremities Psych: Normal affect and thought content  Time Spent: 40 minutes of total time was spent on the date of the encounter performing the following actions: chart review prior to seeing the patient including revent visits with previous specialists, obtaining history, performing a medically necessary exam, counseling on the treatment plan, placing orders, and documenting in our EHR.        Algis Greenhouse. Jerline Pain, MD 05/28/2022 10:19 AM

## 2022-05-28 NOTE — Assessment & Plan Note (Signed)
Still has significant mount of pain.  He has not followed up with physical therapy as directed.  Like to get x-rays done on his knees today.  We did discuss having him follow-up with his orthopedist to have this done however he would like to get them done before he goes back.  Place order today though will defer further management till he sees his orthopedist.  Recommended he follow-up with physical therapy as well.

## 2022-05-28 NOTE — Addendum Note (Signed)
Addended by: Vivi Barrack on: 05/28/2022 10:20 AM   Modules accepted: Level of Service

## 2022-05-29 ENCOUNTER — Ambulatory Visit: Payer: Medicare Other | Admitting: Podiatry

## 2022-05-30 NOTE — Progress Notes (Signed)
Please inform patient of the following:  Vitamin D low.  This could explain some of the symptoms.  Recommend we start supplementation 50,000 IUs once weekly.  Would like for him to come back in 3 to 6 months to recheck.  Can we see if we can add on an A1c?  Looks like it was not ran.  All of his other labs are stable and we can recheck everything else in a year.

## 2022-06-01 ENCOUNTER — Encounter: Payer: Self-pay | Admitting: Family Medicine

## 2022-06-05 NOTE — Telephone Encounter (Signed)
Please see previous result note. His kidney numbers are stable compared to previous values.  I had sent a message in my note about adding on an A1c. If it is too late now then we will have to have him come back.  [name]. Jerline Pain, MD 06/05/2022 1:13 PM

## 2022-06-06 ENCOUNTER — Other Ambulatory Visit: Payer: Self-pay | Admitting: *Deleted

## 2022-06-06 ENCOUNTER — Ambulatory Visit: Payer: Medicare Other | Admitting: Hematology and Oncology

## 2022-06-06 ENCOUNTER — Other Ambulatory Visit: Payer: Medicare Other

## 2022-06-06 MED ORDER — VITAMIN D (ERGOCALCIFEROL) 1.25 MG (50000 UNIT) PO CAPS: 50000.0000 [IU] | ORAL_CAPSULE | ORAL | 0 refills | Status: DC

## 2022-06-06 NOTE — Telephone Encounter (Signed)
Spoke with patient gave lab results to patient  Patient will like to know what he can do to bring the kidney number low, and if BP medication may be the reason kidney numbers are above limit?  Please advise

## 2022-06-06 NOTE — Telephone Encounter (Signed)
There are several factors that can influence his kidney numbers including medications and if he is dehydrated or not.  It is also common for kidney function to decrease slightly with aging.  His numbers that we got recently are consistent and stable compared to numbers that he has had over the last several years.  The best things he can do for his kidneys is maintain his blood sugar and blood pressure make sure that he is getting plenty of fluids.  Recommend office visit to discuss if he has further questions.

## 2022-06-19 ENCOUNTER — Encounter: Payer: Self-pay | Admitting: Podiatry

## 2022-06-19 ENCOUNTER — Other Ambulatory Visit: Payer: Self-pay | Admitting: Cardiology

## 2022-06-19 ENCOUNTER — Ambulatory Visit (INDEPENDENT_AMBULATORY_CARE_PROVIDER_SITE_OTHER): Payer: Medicare Other | Admitting: Podiatry

## 2022-06-19 VITALS — BP 106/62 | HR 64

## 2022-06-19 DIAGNOSIS — M79675 Pain in left toe(s): Secondary | ICD-10-CM

## 2022-06-19 DIAGNOSIS — E1149 Type 2 diabetes mellitus with other diabetic neurological complication: Secondary | ICD-10-CM

## 2022-06-19 DIAGNOSIS — M79674 Pain in right toe(s): Secondary | ICD-10-CM

## 2022-06-19 DIAGNOSIS — L84 Corns and callosities: Secondary | ICD-10-CM

## 2022-06-19 DIAGNOSIS — Z7901 Long term (current) use of anticoagulants: Secondary | ICD-10-CM

## 2022-06-19 DIAGNOSIS — B351 Tinea unguium: Secondary | ICD-10-CM

## 2022-06-21 ENCOUNTER — Telehealth: Payer: Self-pay | Admitting: Gastroenterology

## 2022-06-21 NOTE — Telephone Encounter (Signed)
Good Afternoon Dr. Rush Landmark,   Patient called stating that his PCP Dr. Jerline Pain had sent over a referral for patient to have a colonoscopy. Patient had last procedure with Dr. Ardis Hughs but has since seen [facility] once in 2019. Patients record from the office visit is in epic, will you please review and advise on scheduling patient for procedure with our practice?   Thank you.

## 2022-06-22 ENCOUNTER — Encounter: Payer: Self-pay | Admitting: Gastroenterology

## 2022-06-22 NOTE — Telephone Encounter (Signed)
Patient has been scheduled for PV on 3/12 at 4:00 and colon on 3/26 at 11:00

## 2022-06-22 NOTE — Telephone Encounter (Signed)
Patient is accepted to return to [facility] for care. In Dr. Eugenia Pancoast absence I am happy to take care of him. He may move forward with scheduling colonoscopy for surveillance of previous colon polyps. If he needs or has other issues that need to be discussed, he can be set up with one of the APP's and I can supervise or with me. Thanks. GM

## 2022-06-23 NOTE — Progress Notes (Signed)
Subjective: 67 y.o. returns the office today for painful, elongated, thickened toenails which he cannot trim himself.  He states that the wound on the right foot is remained healed.  He does get some dry skin, callus but no other open lesions that he reports.  No fevers or chills.  No other concerns.    PCP: [name], MD  Objective: AAO 3, NAD DP/PT pulses palpable, CRT less than 3 seconds Chronic bilateral lower extremity edema present. Protective sensation decreased with Derrel Nip monofilament Nails hypertrophic, dystrophic, elongated, brittle, discolored 10. There is tenderness overlying the nails 1-5 bilaterally. There is no surrounding erythema or drainage along the nail sites. Hyperkeratotic lesion noted on bilateral hallux without any underlying ulceration drainage or signs of infection.  There is no open lesions.  Particular the right hallux has remained healed. No other areas of tenderness bilateral lower extremities. No overlying edema, erythema, increased warmth. No pain with calf compression, swelling, warmth, erythema.  Assessment: Patient presents with symptomatic onychomycosis, ulcerative callus  Plan: -Treatment options including alternatives, risks, complications were discussed -Nails sharply debrided 10 without complication/bleeding. -Sharply debrided hyperkeratotic lesion x2 without any complications or bleeding.  Continue moisturizer, offloading. -Discussed daily foot inspection. If there are any changes, to call the office immediately.  -Follow-up in 3 months or sooner if any problems are to arise. In the meantime, encouraged to call the office with any questions, concerns, changes symptoms.  Celesta Gentile, DPM

## 2022-06-27 ENCOUNTER — Ambulatory Visit: Payer: Medicare Other | Admitting: Cardiology

## 2022-07-04 ENCOUNTER — Telehealth: Payer: Self-pay | Admitting: Internal Medicine

## 2022-07-04 NOTE — Telephone Encounter (Signed)
Spoke with patient. Recall has been placed for either Dr. Silas Flood or Dr. Ander Slade. Pt is currently in [place] and requesting appointment for around March. Nothing further needed.

## 2022-07-04 NOTE — Telephone Encounter (Signed)
Spoke with patient he states he would like to switch providers. Dr. Chase Caller is this okay

## 2022-07-04 NOTE — Telephone Encounter (Signed)
Patient called to ask if someone could call him regarding changing doctors.  Please advise.  CB# [phone]

## 2022-07-04 NOTE — Telephone Encounter (Signed)
   Ok to switch 

## 2022-07-09 ENCOUNTER — Ambulatory Visit: Payer: Medicare Other | Admitting: Internal Medicine

## 2022-07-16 ENCOUNTER — Other Ambulatory Visit: Payer: Self-pay | Admitting: Family Medicine

## 2022-07-16 NOTE — Telephone Encounter (Signed)
Patient is requesting PCP send in either a 3 or 6 month supply. This is due to him always traveling.

## 2022-07-16 NOTE — Progress Notes (Unsigned)
Cardiology Office Note:    Date:  07/17/2022   ID:  [name], DOB November 19, 1955, MRN [id number]  PCP:  Vivi Barrack, MD   [facility] Providers Cardiologist:  Werner Lean, MD     Referring MD: Vivi Barrack, MD   CC: SOB  History of Present Illness:    [name] is a 67 y.o. male with a hx of mild non obstructive CAD, AAA,Morbid Obesity, DM, and HTN; OSA on CPAP COVID-19 2022: patient started on lasix with some improved symptoms, then needed torsemide, preserved EF  2023: CAD and had PCI, CPET suggestive of deconditioning, saw pulm, and V/Q scan.   Called with SOB.  Seen PT advice notes: he met a cardiologist at [facility] that helped him through the airport.  Decreased down to two glass of wine. Stopped BB.  Re-offered sleep study.  Completed cardiac rehab.  Patient notes that he is doing a little bit better.   Goes to the airport and walks and isn't as winded. Is not just on antiplatelet agents. Did 5500 steps. Working out a bit more. There are no interval hospital/ED visit.    He has decrease his wine consumption dramatically.  Still have a bottle over a few days.  No chest pain or pressure . No weight gain or leg swelling.  No palpitations or syncope .   Past Medical History:  Diagnosis Date   Abdominal aortic aneurysm (AAA), 30-34 mm diameter ([place]) 06/01/2017   Nml w/ greatest dm 3 cm US Aorta 06/2016, Abd [place] 05/01/2017 showed infrarenal dilatation at 3.4 cm - rec repeat US in 3 yrs.   Arthritis    Cervical disc disease 12/26/2011   Coronary artery disease involving native coronary artery of native heart without angina pectoris 12/01/2016   He had coronary angiography in 2011. There was irregularity within the LAD with up to 30% narrowing. The myocardial perfusion imaging done 11/07/2015 did not demonstrate any evidence of ischemia with a low risk nuclear stress test other than EF estimated at 47%. Chest CT 01/25/2016 showed diffuse coronary artery  calcifications with heavy calcifications in the LAD. Pt seen by cardiology Dr. Linard Millers    Diabetes mellitus    Difficult intubation 04/05/2014   Dyslipidemia    Family history of anesthesia complication    pt states took days for his father to awaken after mask was used    Fatty liver 06/01/2017   [place] 05/01/2017   GERD (gastroesophageal reflux disease)    has been having acid reflux since recent endoscopy    H/O hiatal hernia    repair with lap band, now has ventral hernia midline abd   Hearing loss-aides 03/26/2012   Hyperlipidemia    Hypertension    Lapband APS + hiatal hernia repair Oct 2015 04/05/2014   Morbid obesity ([place])    OSA (obstructive sleep apnea)    Rotator cuff tear    Seasonal allergies    Shortness of breath    pt states related to high BP meds with extended walking or climbling stairs   Sleep apnea     Past Surgical History:  Procedure Laterality Date   CARDIAC CATHETERIZATION     approx 3 years ago    COLONOSCOPY WITH PROPOFOL N/A 03/10/2015   Procedure: COLONOSCOPY WITH PROPOFOL;  Surgeon: Milus Banister, MD;  Location: [facility];  Service: Endoscopy;  Laterality: N/A;   colonscopy      2012   CORONARY ATHERECTOMY N/A 07/18/2021  Procedure: CORONARY ATHERECTOMY;  Surgeon: Jettie Booze, MD;  Location: [facility];  Service: Cardiovascular;  Laterality: N/A;   CORONARY STENT INTERVENTION N/A 07/18/2021   Procedure: CORONARY STENT INTERVENTION;  Surgeon: Jettie Booze, MD;  Location: [facility];  Service: Cardiovascular;  Laterality: N/A;   ESOPHAGOGASTRODUODENOSCOPY (EGD) WITH PROPOFOL N/A 03/10/2015   Procedure: ESOPHAGOGASTRODUODENOSCOPY (EGD) WITH PROPOFOL;  Surgeon: Milus Banister, MD;  Location: [facility];  Service: Endoscopy;  Laterality: N/A;   HERNIA REPAIR     with gastric banding   LAPAROSCOPIC GASTRIC BANDING N/A 04/05/2014   Procedure: LAPAROSCOPIC GASTRIC BANDING;  Surgeon: Pedro Earls, MD;  Location: [facility];  Service: General;  Laterality: N/A;   MENISCUS REPAIR Left    left knee torn meniscus   RIGHT/LEFT HEART CATH AND CORONARY ANGIOGRAPHY N/A 07/18/2021   Procedure: RIGHT/LEFT HEART CATH AND CORONARY ANGIOGRAPHY;  Surgeon: Jettie Booze, MD;  Location: [facility];  Service: Cardiovascular;  Laterality: N/A;   VASECTOMY      Current Medications: Current Meds  Medication Sig   acetaminophen (TYLENOL) 500 MG tablet Take 500 mg by mouth every 6 (six) hours as needed (for pain.).   diltiazem (CARDIZEM CD) 300 MG 24 hr capsule Take 1 capsule (300 mg total) by mouth in the morning.   diphenhydrAMINE (BENADRYL) 25 MG tablet Take 25 mg by mouth every 6 (six) hours as needed for itching.   empagliflozin (JARDIANCE) 10 MG TABS tablet Take 1 tablet (10 mg total) by mouth daily before breakfast.   fluticasone (FLONASE) 50 MCG/ACT nasal spray Place into the nose as needed.   glucose blood (TRUETEST TEST) test strip Use as instructed   Insulin Degludec (TRESIBA Holden) Inject 114 Units into the skin every morning.   Insulin Pen Needle (B-D UF III MINI PEN NEEDLES) 31G X 5 MM MISC USE AS DIRECTED ONCE A DAY   ketoconazole (NIZORAL) 2 % cream 2 (two) times a week.   lisinopril (ZESTRIL) 20 MG tablet Take 1 tablet (20 mg total) by mouth daily.   Multiple Vitamin (MULTIVITAMIN WITH MINERALS) TABS tablet Take 1 tablet by mouth in the morning.   Potassium Chloride ER 20 MEQ TBCR Take 60 mEq by mouth daily.   rosuvastatin (CRESTOR) 40 MG tablet TAKE ONE TABLET BY MOUTH ONE TIME DAILY   silver sulfADIAZINE (SSD) 1 % cream 2 (two) times a week.   tamsulosin (FLOMAX) 0.4 MG CAPS capsule TAKE ONE CAPSULE BY MOUTH ONE TIME DAILY, 30 MINUTES AFTER THE SAME MEAL EACH DAY   torsemide (DEMADEX) 20 MG tablet Take 3 tablets (60 mg total) by mouth daily.   Vitamin D, Ergocalciferol, (DRISDOL) 1.25 MG (50000 UNIT) CAPS capsule Take 1 capsule (50,000 Units total) by mouth every 7 (seven) days.   [DISCONTINUED]  clopidogrel (PLAVIX) 75 MG tablet TAKE 1 TABLET BY MOUTH DAILY WITH BREAKFAST   [DISCONTINUED] TRESIBA FLEXTOUCH 200 UNIT/ML FlexTouch Pen INJECT 56 UNITS EVERY MORNING AND 56 UNITS EVERY EVENING INTO THE SKIN (Patient taking differently: 114 Units in the morning.)     Allergies:   Farxiga [dapagliflozin] and Penicillins   Social History   Socioeconomic History   Marital status: Married    Spouse name: Not on file   Number of children: 2   Years of education: 16   Highest education level: Bachelor's degree (e.g., BA, AB, BS)  Occupational History   Occupation: owner    Comment: Associate Professor   Occupation: Retired  Tobacco Use   Smoking status: Former    Packs/day: 4.00    Years: 20.00    Total pack years: 80.00    Types: Cigarettes    Start date: 07/19/1973    Quit date: 12/13/1991    Years since quitting: 30.6   Smokeless tobacco: Never  Vaping Use   Vaping Use: Never used  Substance and Sexual Activity   Alcohol use: Yes    Alcohol/week: 3.0 - 5.0 standard drinks of alcohol    Types: 3 - 5 Glasses of wine per week    Comment: takes 3 to 5 glasses of wine nightly    Drug use: No   Sexual activity: Not on file  Other Topics Concern   Not on file  Social History Narrative   Not on file   Social Determinants of Health   Financial Resource Strain: Not on file  Food Insecurity: Not on file  Transportation Needs: Not on file  Physical Activity: Not on file  Stress: Not on file  Social Connections: Not on file    Social: I took care of his wife Margarita Grizzle as well; 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 There is no history of Colon cancer. History of coronary artery disease notable for no members. History of  heart failure notable for no members. History of arrhythmia notable for no members.  ROS:   Please see the history of present illness.     All other systems reviewed and are negative.  EKGs/Labs/Other Studies Reviewed:    The following studies were reviewed today:  EKG:   10/21/20: Sinus bradycardia rate 55 1st HB  NonCardiac CT: Date: 01/25/2016 Results: Aortic Atherosclerosis 3VD Calcification with LAD predominance  OSH CTPE  DATE 12/16/20 The ascending thoracic aorta measures 3.6 x 3.5 cm in transverse by AP dimension. The descending thoracic aorta measures 2.6 x 2.6 cm in transverse by AP dimension. There is atherosclerotic disease of the thoracic aorta and coronary arteries without evidence of focal intimal irregularity.   There is a central pulmonary arterial filling defect in the right lower lobe segmental pulmonary artery (series 4, image 43). No CT evidence of right heart strain. No pericardial effusion or bulky mediastinal adenopathy.   No focal consolidation. No pleural effusion or pneumothorax.   Gastric band is present. There is a cyst dorsally seen in the upper pole the right kidney. The visualized portions of the upper abdomen are otherwiseunremarkable.   Abdominal Aortic Duplex Date: 04/17/2019 Abdominal Aorta: There is evidence of abnormal dilatation of the distal  Abdominal aorta. The largest aortic measurement is 2.9 cm. The largest  aortic diameter remains essentially unchanged compared to prior exam.  Previous diameter measurement was 3.0 cm  obtained on 06/2016.   Cardiac Studies & Procedures   CARDIAC CATHETERIZATION  CARDIAC CATHETERIZATION 07/18/2021  Narrative   Prox RCA to Mid RCA lesion is 90% stenosed.  Heavily calcified lesion.   After orbital atherectomy, A drug-eluting stent was successfully placed using a STENT ONYX FRONTIER 4.0X34 and postdilated to 4.5 mm, optimized with intravascular ultrasound.   Post intervention, there is a 0% residual  stenosis.   Mid RCA lesion is 75% stenosed.   A drug-eluting stent was successfully placed using a  STENT ONYX FRONTIER 4.0X15, postdilated to 4.5 mm and optimized with intravascular ultrasound.   Post intervention, there is a 0% residual stenosis.   The left ventricular systolic function is normal.   LV end diastolic pressure is normal.   The left ventricular ejection fraction is 55-65% by visual estimate.   There is no aortic valve stenosis.   Ao sat 94%, PA sat 74%, PA pressure 25 of 10, mean PA pressure 14 mmHg, mean pulmonary capillary wedge pressure 9 mmHg, cardiac output 8.6 L/min, cardiac index 3.4.  Continue aggressive secondary prevention.  Normal right heart pressures.  I suspect his shortness of breath is likely multifactorial from coronary ischemia as well as obesity and deconditioning.  Weight loss will be beneficial.  Plan for aspirin therapy for 1 month.  Minimum clopidogrel for 6 months.  Okay to restart Xarelto tomorrow.  Would consider treating with clopidogrel for 12 months if the he has no bleeding problems with the combination of Xarelto.  Results conveyed to his wife Cecille Rubin [phone]  Findings Coronary Findings Diagnostic  Dominance: Right  Left Anterior Descending The vessel exhibits minimal luminal irregularities.  Left Circumflex The vessel exhibits minimal luminal irregularities.  Right Coronary Artery Vessel is large. Prox RCA to Mid RCA lesion is 90% stenosed. The lesion is severely calcified. Mid RCA lesion is 75% stenosed. The lesion is moderately calcified.  Intervention  Prox RCA to Mid RCA lesion Atherectomy CATH VISTA GUIDE 6FR XBRCA guide catheter was inserted. WIRE VIPERWIRE COR FLEX .012 guidewire was used to cross lesion. Orbital atherectomy was performed using a CROWN DIAMONDBACK CLASSIC 1.25. Several paced beats were noted at the time of atherectomy. Stent CATH VISTA GUIDE 6FR XBRCA guide catheter was inserted. Lesion crossed with guidewire  using a WIRE ASAHI PROWATER 180CM. Pre-stent angioplasty was performed using a BALLN SCOREFLEX 3.50X15. A drug-eluting stent was successfully placed using a STENT ONYX FRONTIER 4.0X34. Stent strut is well apposed. Post-stent angioplasty was performed using a BALLN  [place] SAPPHIRE 4.5X12. Post-Intervention Lesion Assessment The intervention was successful. Pre-interventional TIMI flow is 3. Post-intervention TIMI flow is 3. No complications occurred at this lesion. Ultrasound (IVUS) was performed on the lesion post PCI using a CATHETER OPTICROSS HD. Stent well apposed. There is a 0% residual stenosis post intervention.  Mid RCA lesion Stent Lesion crossed with guidewire using a WIRE ASAHI PROWATER 180CM. Pre-stent angioplasty was not performed. A drug-eluting stent was successfully placed using a STENT ONYX FRONTIER 4.0X15. Stent strut is well apposed. Post-stent angioplasty was performed using a BALLN  [place] SAPPHIRE 4.5X12. Post-Intervention Lesion Assessment The intervention was successful. Pre-interventional TIMI flow is 3. Post-intervention TIMI flow is 3. No complications occurred at this lesion. Ultrasound (IVUS) was performed on the lesion post PCI using a CATHETER OPTICROSS HD. Stent well apposed. There is a 0% residual stenosis post intervention.   STRESS TESTS  MYOCARDIAL PERFUSION IMAGING 11/08/2015  Narrative  Nuclear stress EF: 47%. The left ventricular ejection fraction is mildly decreased (45-54%).  There was no ST segment deviation noted during stress.  This is a low risk study.  There is no evidence of ischemia or previous infarction   ECHOCARDIOGRAM  ECHOCARDIOGRAM COMPLETE 02/27/2022  Narrative ECHOCARDIOGRAM REPORT    Patient Name:   [name] Date of Exam: 02/27/2022 Medical Rec #:  [id number]        Height:       70.0 in Accession #:    [id number]       Weight:  305.0 lb Date of Birth:  [birth year]        BSA:          2.497 m Patient Age:    1 years          BP:           123/73 mmHg Patient Gender: M                HR:           72 bpm. Exam Location:  [facility]  Procedure: 2D Echo, Cardiac Doppler, Color Doppler and Strain Analysis  Indications:    I50.30 heart failure with preserved EF  History:        Patient has prior history of Echocardiogram examinations, most recent 02/03/2021. CAD, AAA, Pulmonary embolus, Signs/Symptoms:Dyspnea; Risk Factors:Diabetes, Hypertension and Dyslipidemia.  Sonographer:    Marygrace Drought RCS Referring Phys: [id number] [place] A Analy [place]  IMPRESSIONS   1. Left ventricular ejection fraction, by estimation, is 60 to 65%. The left ventricle has normal function. The left ventricle has no regional wall motion abnormalities. Left ventricular diastolic parameters are consistent with Grade I diastolic dysfunction (impaired relaxation). The average left ventricular global longitudinal strain is -23.7 %. The global longitudinal strain is normal. 2. Right ventricular systolic function is normal. The right ventricular size is normal. 3. The mitral valve is normal in structure. No evidence of mitral valve regurgitation. No evidence of mitral stenosis. 4. The aortic valve is tricuspid. There is mild calcification of the aortic valve. There is mild thickening of the aortic valve. Aortic valve regurgitation is not visualized. Aortic valve sclerosis is present, with no evidence of aortic valve stenosis. 5. The inferior vena cava is normal in size with greater than 50% respiratory variability, suggesting right atrial pressure of 3 mmHg.  Comparison(s): No significant change from prior study. Prior images reviewed side by side.  FINDINGS Left Ventricle: Left ventricular ejection fraction, by estimation, is 60 to 65%. The left ventricle has normal function. The left ventricle has no regional wall motion abnormalities. The average left ventricular global longitudinal strain is -23.7 %. The global longitudinal strain is  normal. The left ventricular internal cavity size was normal in size. There is no left ventricular hypertrophy. Left ventricular diastolic parameters are consistent with Grade I diastolic dysfunction (impaired relaxation).  Right Ventricle: The right ventricular size is normal. No increase in right ventricular wall thickness. Right ventricular systolic function is normal.  Left Atrium: Left atrial size was normal in size.  Right Atrium: Right atrial size was normal in size.  Pericardium: There is no evidence of pericardial effusion.  Mitral Valve: The mitral valve is normal in structure. No evidence of mitral valve regurgitation. No evidence of mitral valve stenosis.  Tricuspid Valve: The tricuspid valve is normal in structure. Tricuspid valve regurgitation is trivial. No evidence of tricuspid stenosis.  Aortic Valve: The aortic valve is tricuspid. There is mild calcification of the aortic valve. There is mild thickening of the aortic valve. Aortic valve regurgitation is not visualized. Aortic valve sclerosis is present, with no evidence of aortic valve stenosis.  Pulmonic Valve: The pulmonic valve was normal in structure. Pulmonic valve regurgitation is not visualized. No evidence of pulmonic stenosis.  Aorta: The aortic root is normal in size and structure.  Venous: The inferior vena cava is normal in size with greater than 50% respiratory variability, suggesting right atrial pressure of 3 mmHg.  IAS/Shunts: No atrial level shunt detected by color flow Doppler.  LEFT VENTRICLE PLAX 2D LVIDd:         3.90 cm   Diastology LVIDs:         2.90 cm   LV e' medial:    5.11 cm/s LV PW:         1.00 cm   LV E/e' medial:  19.4 LV IVS:        0.90 cm   LV e' lateral:   6.74 cm/s LVOT diam:     2.10 cm   LV E/e' lateral: 14.7 LV SV:         86 LV SV Index:   34        2D Longitudinal Strain LVOT Area:     3.46 cm  2D Strain GLS (A2C):   -22.0 % 2D Strain GLS (A3C):   -25.8 % 2D Strain  GLS (A4C):   -23.3 % 2D Strain GLS Avg:     -23.7 %  RIGHT VENTRICLE RV Basal diam:  3.40 cm RV S prime:     11.40 cm/s TAPSE (M-mode): 2.0 cm  LEFT ATRIUM             Index        RIGHT ATRIUM           Index LA diam:        4.30 cm 1.72 cm/m   RA Area:     12.60 cm LA Vol (A2C):   65.6 ml 26.27 ml/m  RA Volume:   25.90 ml  10.37 ml/m LA Vol (A4C):   50.5 ml 20.22 ml/m LA Biplane Vol: 58.7 ml 23.50 ml/m AORTIC VALVE LVOT Vmax:   115.00 cm/s LVOT Vmean:  82.800 cm/s LVOT VTI:    0.247 m  AORTA Ao Root diam: 3.50 cm Ao Asc diam:  3.50 cm  MITRAL VALVE MV Area (PHT):              SHUNTS MV Decel Time:              Systemic VTI:  0.25 m MV E velocity: 99.00 cm/s   Systemic Diam: 2.10 cm MV A velocity: 133.00 cm/s MV E/A ratio:  0.74  Candee Furbish MD Electronically signed by Candee Furbish MD Signature Date/Time: 02/27/2022/11:45:09 AM    Final              Recent Labs: 10/31/2021: Magnesium 1.9 01/24/2022: Pro B Natriuretic peptide (BNP) 15.0 05/28/2022: ALT 29; BUN 16; Creatinine, Ser 1.51; Hemoglobin 15.3; Platelets 233.0; Potassium 4.3; Sodium 143; TSH 2.18  Recent Lipid Panel    Component Value Date/Time   CHOL 147 08/15/2018 1001   TRIG 194 (H) 08/15/2018 1001   HDL 54 08/15/2018 1001   CHOLHDL 2.7 08/15/2018 1001   CHOLHDL 5.2 (H) 09/07/2015 1802   VLDL NOT CALC 09/07/2015 1802   LDLCALC 54 08/15/2018 1001   Physical Exam:    VS:  BP 100/70 (BP Location: Left Arm, Patient Position: Sitting, Cuff Size: Large)   Pulse 76   Ht 5' 11"  (1.803 m)   Wt 291 lb 12.8 oz (132.4 kg)   SpO2 96%   BMI 40.70 kg/m     Wt Readings from Last 3 Encounters:  07/17/22 291 lb 12.8 oz (132.4 kg)  05/28/22 296 lb 3.2 oz (134.4 kg)  04/25/22 298 lb 12.8 oz (135.5 kg)    Gen: No distress, Morbid obese Neck: No JVD Cardiac: No Rubs or Gallops, soft systolic murmur normal rhythm, +2 radial pulses Respiratory: Clear  to auscultation bilaterally, normal effort, normal   respiratory rate GI: Soft, nontender, non-distended  MS: +1 edema;  moves all extremities Integument: Skin feels warm Neuro:  At time of evaluation, alert and oriented to person/place/time/situation  Psych: Normal affect, patient feels   ASSESSMENT:    1. Aortic atherosclerosis ([place])   2. Coronary artery disease involving native coronary artery of native heart without angina pectoris   3. Heart failure with preserved ejection fraction, unspecified HF chronicity ([place])   4. OSA (obstructive sleep apnea)   5. Abdominal aortic aneurysm (AAA), 30-34 mm diameter ([facility])   6. 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  Concerns regarding medicines are outlined above.  Orders Placed This Encounter  Procedures   LDL cholesterol, direct   EKG 12-Lead     No orders of the defined types were placed in this encounter.    Patient Instructions  Medication Instructions:  Your physician has recommended you make the following change in your medication:  STOP: clopidogrel (Plavix)  *If you need a refill on your cardiac medications before your next appointment, please call your pharmacy*   Lab Work: TODAY: LDL Direct  If you have labs (blood work) drawn today and  your tests are completely normal, you will receive your results only by: [facility] (if you have MyChart) OR A paper copy in the mail If you have any lab test that is abnormal or we need to change your treatment, we will call you to review the results.   Testing/Procedures: NONE   Follow-Up: At [facility], you and your health needs are our priority.  As part of our continuing mission to provide you with exceptional heart care, we have created designated Provider Care Teams.  These Care Teams include your primary Cardiologist (physician) and Advanced Practice Providers (APPs -  Physician Assistants and Nurse Practitioners) who all work together to provide you with the care you need, when you need it.     Your next appointment:   3-4 month(s)  Provider:   Werner Lean, MD       Signed, Werner Lean, MD  07/17/2022 12:30 PM    [facility]

## 2022-07-17 ENCOUNTER — Encounter: Payer: Self-pay | Admitting: Internal Medicine

## 2022-07-17 ENCOUNTER — Ambulatory Visit: Payer: Medicare Other | Attending: Internal Medicine | Admitting: Internal Medicine

## 2022-07-17 VITALS — BP 100/70 | HR 76 | Ht 71.0 in | Wt 291.8 lb

## 2022-07-17 DIAGNOSIS — E1165 Type 2 diabetes mellitus with hyperglycemia: Secondary | ICD-10-CM | POA: Diagnosis present

## 2022-07-17 DIAGNOSIS — I714 Abdominal aortic aneurysm, without rupture, unspecified: Secondary | ICD-10-CM | POA: Insufficient documentation

## 2022-07-17 DIAGNOSIS — I5030 Unspecified diastolic (congestive) heart failure: Secondary | ICD-10-CM | POA: Diagnosis present

## 2022-07-17 DIAGNOSIS — I2510 Atherosclerotic heart disease of native coronary artery without angina pectoris: Secondary | ICD-10-CM | POA: Insufficient documentation

## 2022-07-17 DIAGNOSIS — G4733 Obstructive sleep apnea (adult) (pediatric): Secondary | ICD-10-CM | POA: Diagnosis present

## 2022-07-17 DIAGNOSIS — I700 Atherosclerosis of aorta: Secondary | ICD-10-CM | POA: Diagnosis present

## 2022-07-17 NOTE — Patient Instructions (Signed)
Medication Instructions:  Your physician has recommended you make the following change in your medication:  STOP: clopidogrel (Plavix)  *If you need a refill on your cardiac medications before your next appointment, please call your pharmacy*   Lab Work: TODAY: LDL Direct  If you have labs (blood work) drawn today and your tests are completely normal, you will receive your results only by: [facility] (if you have MyChart) OR A paper copy in the mail If you have any lab test that is abnormal or we need to change your treatment, we will call you to review the results.   Testing/Procedures: NONE   Follow-Up: At [facility], you and your health needs are our priority.  As part of our continuing mission to provide you with exceptional heart care, we have created designated Provider Care Teams.  These Care Teams include your primary Cardiologist (physician) and Advanced Practice Providers (APPs -  Physician Assistants and Nurse Practitioners) who all work together to provide you with the care you need, when you need it.     Your next appointment:   3-4 month(s)  Provider:   Werner Lean, MD

## 2022-07-18 LAB — LDL CHOLESTEROL, DIRECT: LDL Direct: 97 mg/dL (ref 0–99)

## 2022-07-21 ENCOUNTER — Encounter: Payer: Self-pay | Admitting: Internal Medicine

## 2022-07-21 DIAGNOSIS — E785 Hyperlipidemia, unspecified: Secondary | ICD-10-CM

## 2022-07-21 DIAGNOSIS — I700 Atherosclerosis of aorta: Secondary | ICD-10-CM

## 2022-07-21 DIAGNOSIS — I2510 Atherosclerotic heart disease of native coronary artery without angina pectoris: Secondary | ICD-10-CM

## 2022-07-27 MED ORDER — EZETIMIBE 10 MG PO TABS: 10.0000 mg | ORAL_TABLET | Freq: Every day | ORAL | 3 refills | Status: DC

## 2022-07-30 ENCOUNTER — Ambulatory Visit: Payer: Medicare Other | Admitting: Internal Medicine

## 2022-08-08 ENCOUNTER — Other Ambulatory Visit: Payer: Self-pay

## 2022-08-08 ENCOUNTER — Telehealth: Payer: Self-pay | Admitting: *Deleted

## 2022-08-08 DIAGNOSIS — Z1211 Encounter for screening for malignant neoplasm of colon: Secondary | ICD-10-CM

## 2022-08-08 NOTE — Telephone Encounter (Signed)
Pt will be advised of the appt on 3/1 at previsit

## 2022-08-08 NOTE — Telephone Encounter (Signed)
Colon scheduled with GM at [facility] on 09/20/22 a 3 pm   Left message on machine to call back   Need to send prep

## 2022-08-08 NOTE — Telephone Encounter (Signed)
Noted  

## 2022-08-08 NOTE — Telephone Encounter (Signed)
Pt.scheduled for recall colon 09/11/22,pre-visit 08/28/22,pt. Has in chart difficult airway,please review chart and advise?

## 2022-08-09 NOTE — Telephone Encounter (Signed)
Thanks for update. GM 

## 2022-08-10 ENCOUNTER — Encounter: Payer: Self-pay | Admitting: Podiatry

## 2022-08-10 ENCOUNTER — Other Ambulatory Visit: Payer: Self-pay | Admitting: Podiatry

## 2022-08-10 MED ORDER — DOXYCYCLINE HYCLATE 100 MG PO TABS: 100.0000 mg | ORAL_TABLET | Freq: Two times a day (BID) | ORAL | 0 refills | Status: DC

## 2022-08-13 ENCOUNTER — Telehealth: Payer: Self-pay | Admitting: Hematology and Oncology

## 2022-08-13 NOTE — Telephone Encounter (Signed)
Called patient per provider PAL to reschedule 5/8 appointment. Patient rescheduled and notified.

## 2022-08-23 ENCOUNTER — Telehealth: Payer: Self-pay | Admitting: *Deleted

## 2022-08-23 NOTE — Telephone Encounter (Signed)
[name],   This pt is a documented difficult intubation and his procedure will need to be done at the hospital.   Thanks,  Daran Favaro 

## 2022-08-24 NOTE — Telephone Encounter (Signed)
Hospital procedure scheduled for 09-20-22 w/Dr Mansouraty.

## 2022-08-25 ENCOUNTER — Other Ambulatory Visit: Payer: Self-pay | Admitting: Family Medicine

## 2022-08-27 ENCOUNTER — Other Ambulatory Visit: Payer: Self-pay

## 2022-08-27 DIAGNOSIS — E559 Vitamin D deficiency, unspecified: Secondary | ICD-10-CM

## 2022-08-28 ENCOUNTER — Ambulatory Visit (AMBULATORY_SURGERY_CENTER): Payer: Medicare Other | Admitting: *Deleted

## 2022-08-28 VITALS — Ht 72.0 in | Wt 293.0 lb

## 2022-08-28 DIAGNOSIS — Z86010 Personal history of colonic polyps: Secondary | ICD-10-CM

## 2022-08-28 MED ORDER — NA SULFATE-K SULFATE-MG SULF 17.5-3.13-1.6 GM/177ML PO SOLN: 1.0000 | Freq: Once | ORAL | 0 refills | Status: AC

## 2022-08-28 NOTE — Progress Notes (Signed)
No egg or soy allergy known to patient  No issues known to pt with past sedation with any surgeries or procedures Patient denies ever being told they had issues or difficulty with intubation,documented difficult intubation.  No FH of Malignant Hyperthermia Pt is not on diet pills Pt is not on  home 02  Pt is not on blood thinners  Pt denies issues with constipation  No A fib or A flutter Have any cardiac testing pending--no Pt instructed to use Single[employer]com or GoodRx for a price reduction on prep

## 2022-09-03 ENCOUNTER — Ambulatory Visit (INDEPENDENT_AMBULATORY_CARE_PROVIDER_SITE_OTHER): Payer: Medicare Other | Admitting: Podiatry

## 2022-09-03 ENCOUNTER — Ambulatory Visit (INDEPENDENT_AMBULATORY_CARE_PROVIDER_SITE_OTHER): Payer: Medicare Other | Admitting: Family Medicine

## 2022-09-03 ENCOUNTER — Other Ambulatory Visit: Payer: Self-pay | Admitting: Family Medicine

## 2022-09-03 ENCOUNTER — Encounter: Payer: Self-pay | Admitting: Family Medicine

## 2022-09-03 VITALS — BP 108/70 | HR 69 | Temp 98.0°F | Ht 72.0 in | Wt 295.0 lb

## 2022-09-03 DIAGNOSIS — E785 Hyperlipidemia, unspecified: Secondary | ICD-10-CM

## 2022-09-03 DIAGNOSIS — R5383 Other fatigue: Secondary | ICD-10-CM | POA: Diagnosis not present

## 2022-09-03 DIAGNOSIS — L84 Corns and callosities: Secondary | ICD-10-CM

## 2022-09-03 DIAGNOSIS — E11621 Type 2 diabetes mellitus with foot ulcer: Secondary | ICD-10-CM | POA: Diagnosis not present

## 2022-09-03 DIAGNOSIS — E1169 Type 2 diabetes mellitus with other specified complication: Secondary | ICD-10-CM

## 2022-09-03 DIAGNOSIS — I152 Hypertension secondary to endocrine disorders: Secondary | ICD-10-CM

## 2022-09-03 DIAGNOSIS — E1159 Type 2 diabetes mellitus with other circulatory complications: Secondary | ICD-10-CM | POA: Diagnosis not present

## 2022-09-03 DIAGNOSIS — Z125 Encounter for screening for malignant neoplasm of prostate: Secondary | ICD-10-CM | POA: Diagnosis not present

## 2022-09-03 DIAGNOSIS — E1165 Type 2 diabetes mellitus with hyperglycemia: Secondary | ICD-10-CM

## 2022-09-03 DIAGNOSIS — L97519 Non-pressure chronic ulcer of other part of right foot with unspecified severity: Secondary | ICD-10-CM

## 2022-09-03 DIAGNOSIS — E559 Vitamin D deficiency, unspecified: Secondary | ICD-10-CM

## 2022-09-03 DIAGNOSIS — N401 Enlarged prostate with lower urinary tract symptoms: Secondary | ICD-10-CM | POA: Insufficient documentation

## 2022-09-03 DIAGNOSIS — N400 Enlarged prostate without lower urinary tract symptoms: Secondary | ICD-10-CM

## 2022-09-03 DIAGNOSIS — R351 Nocturia: Secondary | ICD-10-CM

## 2022-09-03 DIAGNOSIS — E1149 Type 2 diabetes mellitus with other diabetic neurological complication: Secondary | ICD-10-CM | POA: Diagnosis not present

## 2022-09-03 LAB — LDL CHOLESTEROL, DIRECT: Direct LDL: 44 mg/dL

## 2022-09-03 LAB — CBC
HCT: 44.1 % (ref 39.0–52.0)
Hemoglobin: 14.8 g/dL (ref 13.0–17.0)
MCHC: 33.5 g/dL (ref 30.0–36.0)
MCV: 95 fl (ref 78.0–100.0)
Platelets: 210 10*3/uL (ref 150.0–400.0)
RBC: 4.65 Mil/uL (ref 4.22–5.81)
RDW: 14.5 % (ref 11.5–15.5)
WBC: 4.5 10*3/uL (ref 4.0–10.5)

## 2022-09-03 LAB — COMPREHENSIVE METABOLIC PANEL
ALT: 33 U/L (ref 0–53)
AST: 36 U/L (ref 0–37)
Albumin: 4.2 g/dL (ref 3.5–5.2)
Alkaline Phosphatase: 51 U/L (ref 39–117)
BUN: 18 mg/dL (ref 6–23)
CO2: 30 mEq/L (ref 19–32)
Calcium: 9 mg/dL (ref 8.4–10.5)
Chloride: 98 mEq/L (ref 96–112)
Creatinine, Ser: 1.46 mg/dL (ref 0.40–1.50)
GFR: 49.46 mL/min — ABNORMAL LOW (ref >60.00)
Glucose, Bld: 200 mg/dL — ABNORMAL HIGH (ref 70–99)
Potassium: 4.1 mEq/L (ref 3.5–5.1)
Sodium: 139 mEq/L (ref 135–145)
Total Bilirubin: 0.5 mg/dL (ref 0.2–1.2)
Total Protein: 6.6 g/dL (ref 6.0–8.3)

## 2022-09-03 LAB — TSH: TSH: 1.27 u[IU]/mL (ref 0.35–5.50)

## 2022-09-03 LAB — LIPID PANEL
Cholesterol: 118 mg/dL (ref 0–200)
HDL: 40.8 mg/dL (ref >39.00)
NonHDL: 76.8
Total CHOL/HDL Ratio: 3
Triglycerides: 258 mg/dL — ABNORMAL HIGH (ref 0.0–149.0)
VLDL: 51.6 mg/dL — ABNORMAL HIGH (ref 0.0–40.0)

## 2022-09-03 LAB — HEMOGLOBIN A1C: Hgb A1c MFr Bld: 8.4 % — ABNORMAL HIGH (ref 4.6–6.5)

## 2022-09-03 LAB — VITAMIN D 25 HYDROXY (VIT D DEFICIENCY, FRACTURES): VITD: 25.62 ng/mL — ABNORMAL LOW (ref 30.00–100.00)

## 2022-09-03 LAB — VITAMIN B12: Vitamin B-12: 307 pg/mL (ref 211–911)

## 2022-09-03 LAB — PSA: PSA: 1.11 ng/mL (ref 0.10–4.00)

## 2022-09-03 MED ORDER — DILTIAZEM HCL ER COATED BEADS 240 MG PO CP24: 240.0000 mg | ORAL_CAPSULE | Freq: Every morning | ORAL | 3 refills | Status: DC

## 2022-09-03 NOTE — Telephone Encounter (Signed)
The original prescription was discontinued on 07/17/2022 by Tamsen Snider. Renewing this prescription may not be appropriate.

## 2022-09-03 NOTE — Telephone Encounter (Signed)
Can you please schedule him? Thanks!

## 2022-09-03 NOTE — Assessment & Plan Note (Signed)
On Flomax 0.4 mg daily.  He is having more urinary urgency and hesitancy.  Will check PSA today and refer to urology.

## 2022-09-03 NOTE — Assessment & Plan Note (Signed)
Follows with cardiology.  On Crestor 40 mg daily.

## 2022-09-03 NOTE — Assessment & Plan Note (Signed)
Finished course of 50,000 IUs weekly for 3 months.  Will recheck vitamin D today.

## 2022-09-03 NOTE — Assessment & Plan Note (Signed)
Blood pressure on lower side today.  This may be contributing some to his fatigue.  Will decrease diltiazem to 240 mg daily.  Continue lisinopril 20 mg daily.  They will follow-up with me in a few weeks via MyChart.  Recheck in office in 3 months.

## 2022-09-03 NOTE — Progress Notes (Unsigned)
Subjective: Chief Complaint  Patient presents with   Foot Problem    Bilateral feet have ulcerative callouses on the great hallux, shooting pain in the right great toe     He has been wrapping but not been wearing shoes.    Last A1c today was 8.4  Objective: AAO x3, NAD DP/PT pulses palpable bilaterally, CRT less than 3 seconds Protective sensation intact with Simms Weinstein monofilament, vibratory sensation intact, Achilles tendon reflex intact No areas of pinpoint bony tenderness or pain with vibratory sensation. MMT 5/5, ROM WNL. No edema, erythema, increase in warmth to bilateral lower extremities.  No open lesions or pre-ulcerative lesions.  No pain with calf compression, swelling, warmth, erythema  Assessment:  Plan: -All treatment options discussed with the patient including all alternatives, risks, complications.   -Patient encouraged to call the office with any questions, concerns, change in symptoms.

## 2022-09-03 NOTE — Assessment & Plan Note (Signed)
Doing well on current regimen tresiba 114 units daily, Jardiance 10 mg daily.  A1c today.

## 2022-09-03 NOTE — Progress Notes (Signed)
   [name] is a 67 y.o. male who presents today for an office visit.  Assessment/Plan:  New/Acute Problems: Other Fatigue  Likely multifactorial.  We are adjusting blood pressure meds as below.  Check labs including CBC, c-Met, TSH, B12, and vitamin D.  Chronic Problems Addressed Today: Hypertension associated with diabetes ([place]) Blood pressure on lower side today.  This may be contributing some to his fatigue.  Will decrease diltiazem to 240 mg daily.  Continue lisinopril 20 mg daily.  They will follow-up with me in a few weeks via MyChart.  Recheck in office in 3 months.  Dyslipidemia associated with type 2 diabetes mellitus ([place]) Follows with cardiology.  On Crestor 40 mg daily.  T2DM (type 2 diabetes mellitus) ([place]) Doing well on current regimen tresiba 114 units daily, Jardiance 10 mg daily.  A1c today.  Corn Patient with significant pain and on bilateral great toes secondary to corn.  He is following with podiatry for this.  BPH (benign prostatic hyperplasia) On Flomax 0.4 mg daily.  He is having more urinary urgency and hesitancy.  Will check PSA today and refer to urology.  Vitamin D deficiency Finished course of 50,000 IUs weekly for 3 months.  Will recheck vitamin D today.     Subjective:  HPI:  See A/p for status of chronic conditions. He is here today for 3 month follow up.  Overall is doing well.  He has seen several specialist since our  last visit including oncology, ophthalmology, and cardiology.  Still has ongoing fatigue.  Thinks it may be due to long COVID.       Objective:  Physical Exam: BP 108/70   Pulse 69   Temp 98 F (36.7 C) (Temporal)   Ht 6' (1.829 m)   Wt 295 lb (133.8 kg)   SpO2 95%   BMI 40.01 kg/m   Wt Readings from Last 3 Encounters:  09/03/22 295 lb (133.8 kg)  08/28/22 293 lb (132.9 kg)  07/17/22 291 lb 12.8 oz (132.4 kg)    Gen: No acute distress, resting comfortably CV: Regular rate and rhythm with no murmurs  appreciated Pulm: Normal work of breathing, clear to auscultation bilaterally with no crackles, wheezes, or rhonchi Neuro: Grossly normal, moves all extremities Psych: Normal affect and thought content      Boleslaw Borghi M. Jerline Pain, MD 09/03/2022 10:53 AM

## 2022-09-03 NOTE — Patient Instructions (Addendum)
It was very nice to see you today!  We will check blood work today.   Please decrease your diltiazem to 240mg  daily.  Keep an eye on your BP and let us know how it is reading in a few weeks.  Please come back in 3 months. Come back sooner if needed.   Take care, Dr Jerline Pain  PLEASE NOTE:  If you had any lab tests, please let us know if you have not heard back within a few days. You may see your results on mychart before we have a chance to review them but we will give you a call once they are reviewed by [place].   If we ordered any referrals today, please let us know if you have not heard from their office within the next week.   If you had any urgent prescriptions sent in today, please check with the pharmacy within an hour of our visit to make sure the prescription was transmitted appropriately.   Please try these tips to maintain a healthy lifestyle:  Eat at least 3 REAL meals and 1-2 snacks per day.  Aim for no more than 5 hours between eating.  If you eat breakfast, please do so within one hour of getting up.   Each meal should contain half fruits/vegetables, one quarter protein, and one quarter carbs (no bigger than a computer mouse)  Cut down on sweet beverages. This includes juice, soda, and sweet tea.   Drink at least 1 glass of water with each meal and aim for at least 8 glasses per day  Exercise at least 150 minutes every week.

## 2022-09-03 NOTE — Assessment & Plan Note (Signed)
Patient with significant pain and on bilateral great toes secondary to corn.  He is following with podiatry for this.

## 2022-09-05 ENCOUNTER — Encounter: Payer: Self-pay | Admitting: Internal Medicine

## 2022-09-05 ENCOUNTER — Other Ambulatory Visit: Payer: Self-pay | Admitting: *Deleted

## 2022-09-05 MED ORDER — EMPAGLIFLOZIN 25 MG PO TABS: 25.0000 mg | ORAL_TABLET | Freq: Every day | ORAL | 1 refills | Status: DC

## 2022-09-05 NOTE — Progress Notes (Signed)
Please inform patient of the following:  His A1c is at 8.4.  This is higher than I would like for it to be.  We can increase his Jardiance to 25 mg daily.  Please send in new prescription if he is agreeable to start.  Regardless he should continue to work on diet and exercise.  I would like to see him back in 3 months to recheck.  His vitamin D is still a bit low.  Please make sure that he is getting 1000 2000 IUs of vitamin D daily.  We should recheck this in 3 to 6 months.  The rest of his labs are all stable and we can recheck in a year or so.

## 2022-09-11 ENCOUNTER — Encounter: Payer: Medicare Other | Admitting: Gastroenterology

## 2022-09-14 ENCOUNTER — Institutional Professional Consult (permissible substitution): Payer: Medicare Other | Admitting: Cardiology

## 2022-09-17 ENCOUNTER — Encounter (HOSPITAL_COMMUNITY): Payer: Self-pay | Admitting: Gastroenterology

## 2022-09-17 NOTE — Progress Notes (Signed)
Attempted to obtain medical history via telephone, unable to reach at this time. HIPAA compliant voicemail message left requesting return call to pre surgical testing department. 

## 2022-09-18 ENCOUNTER — Ambulatory Visit: Payer: Medicare Other | Admitting: Podiatry

## 2022-09-19 ENCOUNTER — Other Ambulatory Visit: Payer: Self-pay | Admitting: Family Medicine

## 2022-09-20 ENCOUNTER — Encounter (HOSPITAL_COMMUNITY)
Admission: RE | Disposition: A | Discharge status: Home / Self Care | Payer: Self-pay | Source: Home / Self Care | Attending: Gastroenterology

## 2022-09-20 ENCOUNTER — Ambulatory Visit (HOSPITAL_COMMUNITY)
Admission: RE | Admit: 2022-09-20 | Discharge: 2022-09-20 | Disposition: A | Discharge status: Home / Self Care | Payer: Medicare Other | Attending: Gastroenterology | Admitting: Gastroenterology

## 2022-09-20 ENCOUNTER — Ambulatory Visit (HOSPITAL_BASED_OUTPATIENT_CLINIC_OR_DEPARTMENT_OTHER): Payer: Medicare Other | Admitting: Anesthesiology

## 2022-09-20 ENCOUNTER — Other Ambulatory Visit: Payer: Self-pay

## 2022-09-20 ENCOUNTER — Encounter (HOSPITAL_COMMUNITY): Payer: Self-pay | Admitting: Gastroenterology

## 2022-09-20 ENCOUNTER — Ambulatory Visit (HOSPITAL_COMMUNITY): Payer: Medicare Other | Admitting: Anesthesiology

## 2022-09-20 DIAGNOSIS — Q438 Other specified congenital malformations of intestine: Secondary | ICD-10-CM

## 2022-09-20 DIAGNOSIS — K5520 Angiodysplasia of colon without hemorrhage: Secondary | ICD-10-CM

## 2022-09-20 DIAGNOSIS — K644 Residual hemorrhoidal skin tags: Secondary | ICD-10-CM | POA: Insufficient documentation

## 2022-09-20 DIAGNOSIS — Z1211 Encounter for screening for malignant neoplasm of colon: Secondary | ICD-10-CM | POA: Diagnosis present

## 2022-09-20 DIAGNOSIS — Z86010 Personal history of colonic polyps: Secondary | ICD-10-CM | POA: Insufficient documentation

## 2022-09-20 DIAGNOSIS — Z7984 Long term (current) use of oral hypoglycemic drugs: Secondary | ICD-10-CM

## 2022-09-20 DIAGNOSIS — K641 Second degree hemorrhoids: Secondary | ICD-10-CM

## 2022-09-20 DIAGNOSIS — I2510 Atherosclerotic heart disease of native coronary artery without angina pectoris: Secondary | ICD-10-CM

## 2022-09-20 DIAGNOSIS — I10 Essential (primary) hypertension: Secondary | ICD-10-CM

## 2022-09-20 DIAGNOSIS — Z87891 Personal history of nicotine dependence: Secondary | ICD-10-CM | POA: Diagnosis not present

## 2022-09-20 DIAGNOSIS — E119 Type 2 diabetes mellitus without complications: Secondary | ICD-10-CM

## 2022-09-20 HISTORY — PX: COLONOSCOPY WITH PROPOFOL: SHX5780

## 2022-09-20 HISTORY — PX: BIOPSY: SHX5522

## 2022-09-20 LAB — GLUCOSE, CAPILLARY
Glucose-Capillary: 103 mg/dL — ABNORMAL HIGH (ref 70–99)
Glucose-Capillary: 90 mg/dL (ref 70–99)

## 2022-09-20 SURGERY — COLONOSCOPY WITH PROPOFOL
Anesthesia: Monitor Anesthesia Care

## 2022-09-20 MED ORDER — LACTATED RINGERS IV SOLN: INTRAVENOUS | Status: DC | PRN

## 2022-09-20 MED ORDER — PROPOFOL 1000 MG/100ML IV EMUL
INTRAVENOUS | Status: AC
  Filled 2022-09-20: qty 100

## 2022-09-20 MED ORDER — AMISULPRIDE (ANTIEMETIC) 5 MG/2ML IV SOLN: 10.0000 mg | Freq: Once | INTRAVENOUS | Status: DC | PRN

## 2022-09-20 MED ORDER — PROPOFOL 10 MG/ML IV BOLUS
INTRAVENOUS | Status: DC | PRN
  Administered 2022-09-20: 20 mg via INTRAVENOUS

## 2022-09-20 MED ORDER — SODIUM CHLORIDE 0.9 % IV SOLN: INTRAVENOUS | Status: DC

## 2022-09-20 MED ORDER — LIDOCAINE 2% (20 MG/ML) 5 ML SYRINGE
INTRAMUSCULAR | Status: DC | PRN
  Administered 2022-09-20: 60 mg via INTRAVENOUS

## 2022-09-20 MED ORDER — ONDANSETRON HCL 4 MG/2ML IJ SOLN: 4.0000 mg | Freq: Once | INTRAMUSCULAR | Status: DC | PRN

## 2022-09-20 MED ORDER — PROPOFOL 500 MG/50ML IV EMUL
INTRAVENOUS | Status: DC | PRN
  Administered 2022-09-20: 125 ug/kg/min via INTRAVENOUS

## 2022-09-20 SURGICAL SUPPLY — 22 items

## 2022-09-20 NOTE — Anesthesia Preprocedure Evaluation (Addendum)
Anesthesia Evaluation  Patient identified by MRN, date of birth, ID band Patient awake    Reviewed: Allergy & Precautions, NPO status , Patient's Chart, lab work & pertinent test results  Airway Mallampati: III  TM Distance: >3 FB Neck ROM: Full    Dental  (+) Chipped, Dental Advisory Given   Pulmonary sleep apnea , COPD, former smoker   breath sounds clear to auscultation       Cardiovascular hypertension, Pt. on medications + CAD and + Cardiac Stents   Rhythm:Regular Rate:Normal  Echo: 1. Left ventricular ejection fraction, by estimation, is 60 to 65%. The  left ventricle has normal function. The left ventricle has no regional  wall motion abnormalities. Left ventricular diastolic parameters are  consistent with Grade I diastolic  dysfunction (impaired relaxation). The average left ventricular global  longitudinal strain is -23.7 %. The global longitudinal strain is normal.   2. Right ventricular systolic function is normal. The right ventricular  size is normal.   3. The mitral valve is normal in structure. No evidence of mitral valve  regurgitation. No evidence of mitral stenosis.   4. The aortic valve is tricuspid. There is mild calcification of the  aortic valve. There is mild thickening of the aortic valve. Aortic valve  regurgitation is not visualized. Aortic valve sclerosis is present, with  no evidence of aortic valve stenosis.   5. The inferior vena cava is normal in size with greater than 50%  respiratory variability, suggesting right atrial pressure of 3 mmHg.     Neuro/Psych negative neurological ROS  negative psych ROS   GI/Hepatic Neg liver ROS, hiatal hernia,GERD  ,,  Endo/Other  diabetes, Type 2, Oral Hypoglycemic Agents    Renal/GU negative Renal ROS     Musculoskeletal  (+) Arthritis ,    Abdominal Normal abdominal exam  (+)   Peds  Hematology negative hematology ROS (+)   Anesthesia Other  Findings   Reproductive/Obstetrics                              Anesthesia Physical Anesthesia Plan  ASA: 3  Anesthesia Plan: MAC   Post-op Pain Management: Minimal or no pain anticipated   Induction: Intravenous  PONV Risk Score and Plan: 0 and Propofol infusion  Airway Management Planned: Natural Airway and Simple Face Mask  Additional Equipment: None  Intra-op Plan:   Post-operative Plan:   Informed Consent: I have reviewed the patients History and Physical, chart, labs and discussed the procedure including the risks, benefits and alternatives for the proposed anesthesia with the patient or authorized representative who has indicated his/her understanding and acceptance.       Plan Discussed with: CRNA  Anesthesia Plan Comments:         Anesthesia Quick Evaluation

## 2022-09-20 NOTE — Transfer of Care (Signed)
Immediate Anesthesia Transfer of Care Note  Patient: [name]  Procedure(s) Performed: COLONOSCOPY WITH PROPOFOL BIOPSY  Patient Location: Endoscopy Unit  Anesthesia Type:MAC  Level of Consciousness: awake, oriented, and patient cooperative  Airway & Oxygen Therapy: Patient Spontanous Breathing and Patient connected to face mask oxygen  Post-op Assessment: Report given to RN and Post -op Vital signs reviewed and stable  Post vital signs: Reviewed  Last Vitals:  Vitals Value Taken Time  BP 110/58 09/20/22 1540  Temp 36.6 C 09/20/22 1534  Pulse 69 09/20/22 1544  Resp 14 09/20/22 1544  SpO2 98 % 09/20/22 1544  Vitals shown include unvalidated device data.  Last Pain:  Vitals:   09/20/22 1534  TempSrc: Temporal  PainSc: 0-No pain         Complications: No notable events documented.

## 2022-09-20 NOTE — Discharge Instructions (Signed)
YOU HAD AN ENDOSCOPIC PROCEDURE TODAY: Refer to the procedure report and other information in the discharge instructions given to you for any specific questions about what was found during the examination. If this information does not answer your questions, please call [place] office at [phone] to clarify.  ° °YOU SHOULD EXPECT: Some feelings of bloating in the abdomen. Passage of more gas than usual. Walking can help get rid of the air that was put into your GI tract during the procedure and reduce the bloating. If you had a lower endoscopy (such as a colonoscopy or flexible sigmoidoscopy) you may notice spotting of blood in your stool or on the toilet paper. Some abdominal soreness may be present for a day or two, also. ° °DIET: Your first meal following the procedure should be a light meal and then it is ok to progress to your normal diet. A half-sandwich or bowl of soup is an example of a good first meal. Heavy or fried foods are harder to digest and may make you feel nauseous or bloated. Drink plenty of fluids but you should avoid alcoholic beverages for 24 hours. If you had a esophageal dilation, please see attached instructions for diet.   ° °ACTIVITY: Your care partner should take you home directly after the procedure. You should plan to take it easy, moving slowly for the rest of the day. You can resume normal activity the day after the procedure however YOU SHOULD NOT DRIVE, use power tools, machinery or perform tasks that involve climbing or major physical exertion for 24 hours (because of the sedation medicines used during the test).  ° °SYMPTOMS TO REPORT IMMEDIATELY: °A gastroenterologist can be reached at any hour. Please call [phone]  for any of the following symptoms:  °Following lower endoscopy (colonoscopy, flexible sigmoidoscopy) °Excessive amounts of blood in the stool  °Significant tenderness, worsening of abdominal pains  °Swelling of the abdomen that is new, acute  °Fever of 100° or  higher  °Following upper endoscopy (EGD, EUS, ERCP, esophageal dilation) °Vomiting of blood or coffee ground material  °New, significant abdominal pain  °New, significant chest pain or pain under the shoulder blades  °Painful or persistently difficult swallowing  °New shortness of breath  °Black, tarry-looking or red, bloody stools ° °FOLLOW UP:  °If any biopsies were taken you will be contacted by phone or by letter within the next 1-3 weeks. Call [phone]  if you have not heard about the biopsies in 3 weeks.  °Please also call with any specific questions about appointments or follow up tests. ° °

## 2022-09-20 NOTE — Anesthesia Procedure Notes (Signed)
Procedure Name: MAC Date/Time: 09/20/2022 2:40 PM  Performed by: Jenne Campus, CRNAPre-anesthesia Checklist: Patient identified, Emergency Drugs available, Suction available and Patient being monitored Oxygen Delivery Method: Simple face mask

## 2022-09-20 NOTE — Op Note (Signed)
[facility] Patient Name: [name] Procedure Date: 09/20/2022 MRN: [id number] Attending MD: Justice Britain , MD, [id number] Date of Birth: 10-Apr-1956 CSN: [id number] Age: 67 Admit Type: Outpatient Procedure:                Colonoscopy Indications:              Surveillance: Personal history of adenomatous                            polyps on last colonoscopy > 5 years ago Providers:                Justice Britain, MD, Dulcy Fanny, Luan Moore, Technician, Luciana Axe, CRNA Referring MD:             Algis Greenhouse. [place] Medicines:                Monitored Anesthesia Care Complications:            No immediate complications. Estimated Blood Loss:     Estimated blood loss was minimal. Procedure:                Pre-Anesthesia Assessment:                           - Prior to the procedure, a History and Physical                            was performed, and patient medications and                            allergies were reviewed. The patient's tolerance of                            previous anesthesia was also reviewed. The risks                            and benefits of the procedure and the sedation                            options and risks were discussed with the patient.                            All questions were answered, and informed consent                            was obtained. Prior Anticoagulants: The patient has                            taken no anticoagulant or antiplatelet agents                            except for aspirin. ASA Grade Assessment: III - A  patient with severe systemic disease. After                            reviewing the risks and benefits, the patient was                            deemed in satisfactory condition to undergo the                            procedure.                           After obtaining informed consent, the colonoscope                             was passed under direct vision. Throughout the                            procedure, the patient's blood pressure, pulse, and                            oxygen saturations were monitored continuously. The                            [id number]-HQ190L [id number] Olympus colonoscope was                            introduced through the anus and advanced to the the                            cecum, identified by appendiceal orifice and                            ileocecal valve. The colonoscopy was somewhat                            difficult due to a redundant colon. Successful                            completion of the procedure was aided by changing                            the patient's position, using manual pressure,                            straightening and shortening the scope to obtain                            bowel loop reduction and using scope torsion. The                            patient tolerated the procedure. The quality of the  bowel preparation was adequate. The ileocecal                            valve, appendiceal orifice, and rectum were                            photographed. Scope In: 2:49:16 PM Scope Out: 3:16:25 PM Scope Withdrawal Time: 0 hours 22 minutes 31 seconds  Total Procedure Duration: 0 hours 27 minutes 9 seconds  Findings:      The digital rectal exam findings include hemorrhoids. Pertinent       negatives include no palpable rectal lesions.      The colon (entire examined portion) was grossly redundant leading to       significant looping.      A single medium-sized angioectasia with typical arborization was found       in the ascending colon.      A large amount of liquid stool was found in the entire colon,       interfering with visualization. Lavage and suction via endoscope of the       area was performed using copious amounts, resulting in clearance with       adequate visualization.      Normal mucosa was  found in the entire colon. Biopsies for histology were       taken with a cold forceps from the entire colon for evaluation of       microscopic colitis.      Non-bleeding non-thrombosed external and internal hemorrhoids were found       during retroflexion, during perianal exam and during digital exam. The       hemorrhoids were Grade II (internal hemorrhoids that prolapse but reduce       spontaneously). Impression:               - Hemorrhoids found on digital rectal exam.                           - Redundant colon leading to significant looping.                           - Single ascending colon AVM noted.                           - Stool in the entire examined colon -lavaged with                            adequate visualization.                           - Normal mucosa in the entire examined colon.                            Biopsied.                           - Non-bleeding non-thrombosed external and internal                            hemorrhoids. Moderate Sedation:  Not Applicable - Patient had care per Anesthesia. Recommendation:           - The patient will be observed post-procedure,                            until all discharge criteria are met.                           - Discharge patient to home.                           - Patient has a contact number available for                            emergencies. The signs and symptoms of potential                            delayed complications were discussed with the                            patient. Return to normal activities tomorrow.                            Written discharge instructions were provided to the                            patient.                           - High fiber diet.                           - Await pathology results.                           - Consider bulking fiber (Benefiber or Metamucil or                            Citrucel) once to twice daily in effort of trying                             to bulk looser bowel movements. Be mindful that                            increasing doses may lead to more bloating.                           - Further evaluation of patient's change in bowel                            habits should include, pending negative pathology,                            EPI and SIBO.                           -  Continue present medications.                           - Repeat colonoscopy in 7 years for surveillance                            due to history of previous adenomatous polyps.                            Recommend abdominal binder for any further                            procedures.                           - The findings and recommendations were discussed                            with the patient.                           - The findings and recommendations were discussed                            with the patient's family. Procedure Code(s):        --- Professional ---                           [phone], Colonoscopy, flexible; with biopsy, single                            or multiple Diagnosis Code(s):        --- Professional ---                           Z86.010, Personal history of colonic polyps                           K64.1, Second degree hemorrhoids                           Q43.8, Other specified congenital malformations of                            intestine CPT copyright 2022 [employer]. All rights reserved. The codes documented in this report are preliminary and upon coder review may  be revised to meet current compliance requirements. Justice Britain, MD 09/20/2022 3:37:24 PM Number of Addenda: 0

## 2022-09-20 NOTE — Anesthesia Postprocedure Evaluation (Signed)
Anesthesia Post Note  Patient: [name]  Procedure(s) Performed: COLONOSCOPY WITH PROPOFOL BIOPSY     Patient location during evaluation: PACU Anesthesia Type: MAC Level of consciousness: awake and alert Pain management: pain level controlled Vital Signs Assessment: post-procedure vital signs reviewed and stable Respiratory status: spontaneous breathing, nonlabored ventilation, respiratory function stable and patient connected to nasal cannula oxygen Cardiovascular status: stable and blood pressure returned to baseline Postop Assessment: no apparent nausea or vomiting Anesthetic complications: no  No notable events documented.  Last Vitals:  Vitals:   09/20/22 1545 09/20/22 1550  BP:  116/73  Pulse: 68 65  Resp: 13 16  Temp:    SpO2: 97% 97%    Last Pain:  Vitals:   09/20/22 1540  TempSrc:   PainSc: 0-No pain                 Effie Berkshire

## 2022-09-20 NOTE — H&P (Signed)
GASTROENTEROLOGY PROCEDURE H&P NOTE   Primary Care Physician: Vivi Barrack, MD  HPI: [name] is a 67 y.o. male who presents for Colonoscopy for surveillance of previous colon polyps (incidental change in bowel habits somewhat improved when takes Pepto at times).  Past Medical History:  Diagnosis Date   Abdominal aortic aneurysm (AAA), 30-34 mm diameter 06/01/2017   Nml w/ greatest dm 3 cm US Aorta 06/2016, Abd [place] 05/01/2017 showed infrarenal dilatation at 3.4 cm - rec repeat US in 3 yrs.   Allergy    "pollen"   Arthritis    Cataract    removed,bilateral   Cervical disc disease 12/26/2011   Coronary artery disease involving native coronary artery of native heart without angina pectoris 12/01/2016   He had coronary angiography in 2011. There was irregularity within the LAD with up to 30% narrowing. The myocardial perfusion imaging done 11/07/2015 did not demonstrate any evidence of ischemia with a low risk nuclear stress test other than EF estimated at 47%. Chest CT 01/25/2016 showed diffuse coronary artery calcifications with heavy calcifications in the LAD. Pt seen by cardiology Dr. Linard Millers    Diabetes mellitus    Difficult intubation 04/05/2014   Dyslipidemia    Family history of anesthesia complication    pt states took days for his father to awaken after mask was used    Fatty liver 06/01/2017   [place] 05/01/2017   GERD (gastroesophageal reflux disease)    has been having acid reflux since recent endoscopy    H/O hiatal hernia    repair with lap band, now has ventral hernia midline abd   Hearing loss-aides 03/26/2012   Hyperlipidemia    Hypertension    Lapband APS + hiatal hernia repair Oct 2015 04/05/2014   Morbid obesity    OSA (obstructive sleep apnea)    Rotator cuff tear    Seasonal allergies    Shortness of breath    pt states related to high BP meds with extended walking or climbling stairs   Sleep apnea    Past Surgical History:  Procedure Laterality  Date   CARDIAC CATHETERIZATION     approx 3 years ago    COLONOSCOPY WITH PROPOFOL N/A 03/10/2015   Procedure: COLONOSCOPY WITH PROPOFOL;  Surgeon: Milus Banister, MD;  Location: [facility];  Service: Endoscopy;  Laterality: N/A;   colonscopy      2012   CORONARY ATHERECTOMY N/A 07/18/2021   Procedure: CORONARY ATHERECTOMY;  Surgeon: Jettie Booze, MD;  Location: [place] [facility];  Service: Cardiovascular;  Laterality: N/A;   CORONARY STENT INTERVENTION N/A 07/18/2021   Procedure: CORONARY STENT INTERVENTION;  Surgeon: Jettie Booze, MD;  Location: [place] [facility];  Service: Cardiovascular;  Laterality: N/A;   ESOPHAGOGASTRODUODENOSCOPY (EGD) WITH PROPOFOL N/A 03/10/2015   Procedure: ESOPHAGOGASTRODUODENOSCOPY (EGD) WITH PROPOFOL;  Surgeon: Milus Banister, MD;  Location: [facility];  Service: Endoscopy;  Laterality: N/A;   HERNIA REPAIR     with gastric banding   LAPAROSCOPIC GASTRIC BANDING N/A 04/05/2014   Procedure: LAPAROSCOPIC GASTRIC BANDING;  Surgeon: Pedro Earls, MD;  Location: [facility];  Service: General;  Laterality: N/A;   MENISCUS REPAIR Left    left knee torn meniscus   RIGHT/LEFT HEART CATH AND CORONARY ANGIOGRAPHY N/A 07/18/2021   Procedure: RIGHT/LEFT HEART CATH AND CORONARY ANGIOGRAPHY;  Surgeon: Jettie Booze, MD;  Location: [place] [facility];  Service: Cardiovascular;  Laterality: N/A;   VASECTOMY  Current Facility-Administered Medications  Medication Dose Route Frequency Provider Last Rate Last Admin   0.9 %  sodium chloride infusion   Intravenous Continuous Mansouraty, Telford Nab., MD       amisulpride (BARHEMSYS) injection 10 mg  10 mg Intravenous Once PRN Effie Berkshire, MD       ondansetron [facility]) injection 4 mg  4 mg Intravenous Once PRN Effie Berkshire, MD        Current Facility-Administered Medications:    0.9 %  sodium chloride infusion, , Intravenous, Continuous, Mansouraty, Telford Nab., MD   amisulpride (BARHEMSYS)  injection 10 mg, 10 mg, Intravenous, Once PRN, Effie Berkshire, MD   ondansetron [facility]) injection 4 mg, 4 mg, Intravenous, Once PRN, Effie Berkshire, MD Allergies  Allergen Reactions   [place] [Dapagliflozin] Diarrhea and Itching   Penicillins Nausea Only    Upset stomach   Family History  Problem Relation Age of Onset   Clotting disorder Mother        lung clot   Mesothelioma Father    Hypertension Brother    Skin cancer Brother    Colon cancer Neg Hx    Colon polyps Neg Hx    Crohn's disease Neg Hx    Esophageal cancer Neg Hx    Rectal cancer Neg Hx    Stomach cancer Neg Hx    Ulcerative colitis Neg Hx    Social History   Socioeconomic History   Marital status: Married    Spouse name: Not on file   Number of children: 2   Years of education: 16   Highest education level: Bachelor's degree (e.g., BA, AB, BS)  Occupational History   Occupation: owner    Comment: Associate Professor   Occupation: Retired  Tobacco Use   Smoking status: Former    Packs/day: 4.00    Years: 20.00    Additional pack years: 0.00    Total pack years: 80.00    Types: Cigarettes    Start date: 07/19/1973    Quit date: 12/13/1991    Years since quitting: 30.7   Smokeless tobacco: Never  Vaping Use   Vaping Use: Never used  Substance and Sexual Activity   Alcohol use: Yes    Alcohol/week: 3.0 - 5.0 standard drinks of alcohol    Types: 3 - 5 Glasses of wine per week    Comment: one glass of wine nightly   Drug use: No   Sexual activity: Not on file  Other Topics Concern   Not on file  Social History Narrative   Not on file   Social Determinants of Health   Financial Resource Strain: Not on file  Food Insecurity: Not on file  Transportation Needs: Not on file  Physical Activity: Not on file  Stress: Not on file  Social Connections: Not on file  Intimate Partner Violence: Not on file    Physical Exam: Today's Vitals   09/17/22 1221 09/20/22 1345  BP:  126/76  Pulse:  72  Resp:   19  Temp:  (!) 97.5 F (36.4 C)  TempSrc:  Temporal  SpO2:  97%  Weight: 133.8 kg 133.8 kg  Height:  6' (1.829 m)  PainSc:  2    Body mass index is 40.01 kg/m. GEN: NAD EYE: Sclerae anicteric ENT: MMM CV: Non-tachycardic GI: Soft, NT/ND NEURO:  Alert & Oriented x 3  Lab Results: No results for input(s): "WBC", "HGB", "HCT", "PLT" in the last 72 hours. BMET No results for input(s): "  NA", "K", "CL", "CO2", "GLUCOSE", "BUN", "CREATININE", "CALCIUM" in the last 72 hours. LFT No results for input(s): "PROT", "ALBUMIN", "AST", "ALT", "ALKPHOS", "BILITOT", "BILIDIR", "IBILI" in the last 72 hours. PT/INR No results for input(s): "LABPROT", "INR" in the last 72 hours.   Impression / Plan: This is a 67 y.o.male who presents for Colonoscopy for surveillance of previous colon polyps (incidental change in bowel habits somewhat improved when takes Pepto at times).  The risks and benefits of endoscopic evaluation/treatment were discussed with the patient and/or family; these include but are not limited to the risk of perforation, infection, bleeding, missed lesions, lack of diagnosis, severe illness requiring hospitalization, as well as anesthesia and sedation related illnesses.  The patient's history has been reviewed, patient examined, no change in status, and deemed stable for procedure.  The patient and/or family is agreeable to proceed.    Justice Britain, MD [facility] Office # [phone]

## 2022-09-21 ENCOUNTER — Encounter: Payer: Self-pay | Admitting: Family Medicine

## 2022-09-21 NOTE — Telephone Encounter (Signed)
Spoke with patient, per Dr [name] Advise take Vit D 1000 to 2000 unit daily  Pt verbalized understanding

## 2022-09-23 ENCOUNTER — Encounter (HOSPITAL_COMMUNITY): Payer: Self-pay | Admitting: Gastroenterology

## 2022-09-24 ENCOUNTER — Encounter: Payer: Self-pay | Admitting: Gastroenterology

## 2022-09-24 LAB — SURGICAL PATHOLOGY

## 2022-09-25 ENCOUNTER — Ambulatory Visit: Payer: Medicare Other | Admitting: Internal Medicine

## 2022-10-02 NOTE — Progress Notes (Unsigned)
Cardiology Office Note:    Date:  10/03/2022   ID:  [name], DOB 1956-01-13, MRN [id number]  PCP:  [name] Dark, MD   [facility] Providers Cardiologist:  [name] Constant, MD     Referring MD: [name] Dark, MD   CC: SOB  History of Present Illness:    [name] is a 67 y.o. male with a hx of mild non obstructive CAD, AAA,Morbid Obesity, DM, and HTN; OSA on CPAP COVID-19 2022: patient started on lasix with some improved symptoms, then needed torsemide, preserved EF  2023: CAD and had PCI, CPET suggestive of deconditioning, saw pulm, and V/Q scan.   Called with SOB.  Seen PT advice notes: he met a cardiologist at [facility] that helped him through the airport.  Decreased down to two glass of wine. Stopped BB.  Re-offered sleep study.  Completed cardiac rehab. 2024: Is back to being tired again  Patient notes that he is doing back to feeling tired again.   Had a colonoscopy, it went well. There are no interval hospital/ED visit.   Has chronic diarrhea.  No chest pain or pressure .  Slightly worse breathing but can still do airports.No palpitations or syncope.  Notes that heart rates and blood pressure have been lower.  His son has been going through a lot and he has noted an increase in his alcohol consumption when he is with his son.  Planned for a trip to [place]  Less active; is toe ulcer is healing and that is keeping him from being as active.    Past Medical History:  Diagnosis Date   Abdominal aortic aneurysm (AAA), 30-34 mm diameter 06/01/2017   Nml w/ greatest dm 3 cm US Aorta 06/2016, Abd [place] 05/01/2017 showed infrarenal dilatation at 3.4 cm - rec repeat US in 3 yrs.   Allergy    "pollen"   Arthritis    Cataract    removed,bilateral   Cervical disc disease 12/26/2011   Coronary artery disease involving native coronary artery of native heart without angina pectoris 12/01/2016   He had coronary angiography in 2011. There was  irregularity within the LAD with up to 30% narrowing. The myocardial perfusion imaging done 11/07/2015 did not demonstrate any evidence of ischemia with a low risk nuclear stress test other than EF estimated at 47%. Chest CT 01/25/2016 showed diffuse coronary artery calcifications with heavy calcifications in the LAD. Pt seen by cardiology Dr. Mendel Ryder    Diabetes mellitus    Difficult intubation 04/05/2014   Dyslipidemia    Family history of anesthesia complication    pt states took days for his father to awaken after mask was used    Fatty liver 06/01/2017   [place] 05/01/2017   GERD (gastroesophageal reflux disease)    has been having acid reflux since recent endoscopy    H/O hiatal hernia    repair with lap band, now has ventral hernia midline abd   Hearing loss-aides 03/26/2012   Hyperlipidemia    Hypertension    Lapband APS + hiatal hernia repair Oct 2015 04/05/2014   Morbid obesity    OSA (obstructive sleep apnea)    Rotator cuff tear    Seasonal allergies    Shortness of breath    pt states related to high BP meds with extended walking or climbling stairs   Sleep apnea     Past Surgical History:  Procedure Laterality Date   BIOPSY  09/20/2022   Procedure:  BIOPSY;  Surgeon: Lemar Lofty., MD;  Location: [facility];  Service: Gastroenterology;;   CARDIAC CATHETERIZATION     approx 3 years ago    COLONOSCOPY WITH PROPOFOL N/A 03/10/2015   Procedure: COLONOSCOPY WITH PROPOFOL;  Surgeon: Rachael Fee, MD;  Location: [facility];  Service: Endoscopy;  Laterality: N/A;   COLONOSCOPY WITH PROPOFOL N/A 09/20/2022   Procedure: COLONOSCOPY WITH PROPOFOL;  Surgeon: Meridee Score Netty Starring., MD;  Location: [facility];  Service: Gastroenterology;  Laterality: N/A;   colonscopy      2012   CORONARY ATHERECTOMY N/A 07/18/2021   Procedure: CORONARY ATHERECTOMY;  Surgeon: Corky Crafts, MD;  Location: [facility] INVASIVE CV LAB;  Service: Cardiovascular;  Laterality: N/A;   CORONARY  STENT INTERVENTION N/A 07/18/2021   Procedure: CORONARY STENT INTERVENTION;  Surgeon: Corky Crafts, MD;  Location: [facility] INVASIVE CV LAB;  Service: Cardiovascular;  Laterality: N/A;   ESOPHAGOGASTRODUODENOSCOPY (EGD) WITH PROPOFOL N/A 03/10/2015   Procedure: ESOPHAGOGASTRODUODENOSCOPY (EGD) WITH PROPOFOL;  Surgeon: Rachael Fee, MD;  Location: [facility];  Service: Endoscopy;  Laterality: N/A;   HERNIA REPAIR     with gastric banding   LAPAROSCOPIC GASTRIC BANDING N/A 04/05/2014   Procedure: LAPAROSCOPIC GASTRIC BANDING;  Surgeon: Valarie Merino, MD;  Location: [facility] ORS;  Service: General;  Laterality: N/A;   MENISCUS REPAIR Left    left knee torn meniscus   RIGHT/LEFT HEART CATH AND CORONARY ANGIOGRAPHY N/A 07/18/2021   Procedure: RIGHT/LEFT HEART CATH AND CORONARY ANGIOGRAPHY;  Surgeon: Corky Crafts, MD;  Location: [facility] INVASIVE CV LAB;  Service: Cardiovascular;  Laterality: N/A;   VASECTOMY      Current Medications: Current Meds  Medication Sig   acetaminophen (TYLENOL) 500 MG tablet Take 500 mg by mouth every 6 (six) hours as needed (for pain.).   aspirin EC 81 MG tablet Take 81 mg by mouth daily. Swallow whole.   Cannabinoids (THC FREE PO) Take 5 mg by mouth daily as needed (pain). One daily chewable   Cholecalciferol (VITAMIN D) 50 MCG (2000 UT) tablet Take 2,000 Units by mouth daily.   diltiazem (CARDIZEM CD) 180 MG 24 hr capsule Take 1 capsule (180 mg total) by mouth daily.   diphenhydrAMINE (BENADRYL) 25 MG tablet Take 25 mg by mouth every 6 (six) hours as needed for itching or allergies.   empagliflozin (JARDIANCE) 25 MG TABS tablet Take 1 tablet (25 mg total) by mouth daily before breakfast.   ezetimibe (ZETIA) 10 MG tablet Take 1 tablet (10 mg total) by mouth daily.   fluticasone (FLONASE) 50 MCG/ACT nasal spray Place 1 spray into both nostrils daily as needed for allergies.   glucose blood (TRUETEST TEST) test strip Use as instructed   ketoconazole (NIZORAL) 2 %  cream Apply 1 Application topically daily as needed for irritation.   lisinopril (ZESTRIL) 20 MG tablet Take 1 tablet (20 mg total) by mouth daily.   loratadine (CLARITIN) 10 MG tablet Take 10 mg by mouth daily.   Multiple Vitamin (MULTIVITAMIN WITH MINERALS) TABS tablet Take 1 tablet by mouth in the morning.   potassium chloride SA (KLOR-CON M) 20 MEQ tablet Take 60 mEq by mouth daily.   rosuvastatin (CRESTOR) 40 MG tablet TAKE ONE TABLET BY MOUTH ONE TIME DAILY   silver sulfADIAZINE (SSD) 1 % cream Apply 1 Application topically daily.   tamsulosin (FLOMAX) 0.4 MG CAPS capsule TAKE ONE CAPSULE BY MOUTH ONE TIME DAILY, 30 MINUTES AFTER THE SAME MEAL EACH DAY   torsemide (DEMADEX)  20 MG tablet Take 3 tablets (60 mg total) by mouth daily.   torsemide (DEMADEX) 20 MG tablet Take 1 tablet (20 mg total) by mouth daily as needed.   TRESIBA FLEXTOUCH 200 UNIT/ML FlexTouch Pen INJECT 56 UNITS INTO THE SKIN EVERY MORNING AND INJECT 56 UNITS INTO THE SKIN EVERY EVENING (Patient taking differently: Inject 114 Units into the skin every morning.)   [DISCONTINUED] diltiazem (CARDIZEM CD) 240 MG 24 hr capsule Take 1 capsule (240 mg total) by mouth in the morning.     Allergies:   Farxiga [dapagliflozin] and Penicillins   Social History   Socioeconomic History   Marital status: Married    Spouse name: Not on file   Number of children: 2   Years of education: 16   Highest education level: Bachelor's degree (e.g., BA, AB, BS)  Occupational History   Occupation: owner    Comment: Games developer   Occupation: Retired  Tobacco Use   Smoking status: Former    Packs/day: 4.00    Years: 20.00    Additional pack years: 0.00    Total pack years: 80.00    Types: Cigarettes    Start date: 07/19/1973    Quit date: 12/13/1991    Years since quitting: 30.8   Smokeless tobacco: Never  Vaping Use   Vaping Use: Never used  Substance and Sexual Activity   Alcohol use: Yes    Alcohol/week: 3.0 - 5.0 standard  drinks of alcohol    Types: 3 - 5 Glasses of wine per week    Comment: one glass of wine nightly   Drug use: No   Sexual activity: Not on file  Other Topics Concern   Not on file  Social History Narrative   Not on file   Social Determinants of Health   Financial Resource Strain: Not on file  Food Insecurity: Not on file  Transportation Needs: Not on file  Physical Activity: Not on file  Stress: Not on file  Social Connections: Not on file    Social: I took care of his wife Jacki Cones as well; 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 [place], [place], one kid got married  Family History: The patient's family history includes Clotting disorder in his mother; Hypertension in his brother; Mesothelioma in his father; Skin cancer in his brother. There is no history of Colon cancer, Colon polyps, Crohn's disease, Esophageal cancer, Rectal cancer, Stomach cancer, or Ulcerative colitis. History of coronary artery disease notable for no members. History of heart failure notable for no members. History of arrhythmia notable for no members.  ROS:   Please see the history of present illness.     All other systems reviewed and are negative.  EKGs/Labs/Other Studies Reviewed:    The following studies were reviewed today:  EKG:   10/21/20: Sinus bradycardia rate 55 1st HB  Abdominal Aortic Duplex Date: 04/17/2019 Abdominal Aorta: There is evidence of abnormal dilatation of the distal  Abdominal aorta. The largest aortic measurement is 2.9 cm. The largest  aortic diameter remains essentially unchanged compared to prior exam.  Previous diameter measurement was 3.0 cm  obtained on 06/2016.   Cardiac Studies & Procedures   CARDIAC CATHETERIZATION  CARDIAC CATHETERIZATION 07/18/2021  Narrative   Prox RCA to Mid RCA lesion is 90% stenosed.  Heavily calcified lesion.   After orbital atherectomy, A drug-eluting stent was successfully placed  using a STENT ONYX FRONTIER  4.0X34 and postdilated to 4.5 mm, optimized with intravascular ultrasound.   Post intervention, there is a 0% residual stenosis.   Mid RCA lesion is 75% stenosed.   A drug-eluting stent was successfully placed using a STENT ONYX FRONTIER 4.0X15, postdilated to 4.5 mm and optimized with intravascular ultrasound.   Post intervention, there is a 0% residual stenosis.   The left ventricular systolic function is normal.   LV end diastolic pressure is normal.   The left ventricular ejection fraction is 55-65% by visual estimate.   There is no aortic valve stenosis.   Ao sat 94%, PA sat 74%, PA pressure 25 of 10, mean PA pressure 14 mmHg, mean pulmonary capillary wedge pressure 9 mmHg, cardiac output 8.6 L/min, cardiac index 3.4.  Continue aggressive secondary prevention.  Normal right heart pressures.  I suspect his shortness of breath is likely multifactorial from coronary ischemia as well as obesity and deconditioning.  Weight loss will be beneficial.  Plan for aspirin therapy for 1 month.  Minimum clopidogrel for 6 months.  Okay to restart Xarelto tomorrow.  Would consider treating with clopidogrel for 12 months if the he has no bleeding problems with the combination of Xarelto.  Results conveyed to his wife Lawson Fiscal [phone]  Findings Coronary Findings Diagnostic  Dominance: Right  Left Anterior Descending The vessel exhibits minimal luminal irregularities.  Left Circumflex The vessel exhibits minimal luminal irregularities.  Right Coronary Artery Vessel is large. Prox RCA to Mid RCA lesion is 90% stenosed. The lesion is severely calcified. Mid RCA lesion is 75% stenosed. The lesion is moderately calcified.  Intervention  Prox RCA to Mid RCA lesion Atherectomy CATH VISTA GUIDE 6FR XBRCA guide catheter was inserted. WIRE VIPERWIRE COR FLEX .012 guidewire was used to cross lesion. Orbital atherectomy was performed using a CROWN DIAMONDBACK CLASSIC 1.25.  Several paced beats were noted at the time of atherectomy. Stent CATH VISTA GUIDE 6FR XBRCA guide catheter was inserted. Lesion crossed with guidewire using a WIRE ASAHI PROWATER 180CM. Pre-stent angioplasty was performed using a BALLN SCOREFLEX 3.50X15. A drug-eluting stent was successfully placed using a STENT ONYX FRONTIER 4.0X34. Stent strut is well apposed. Post-stent angioplasty was performed using a BALLN  [place] SAPPHIRE 4.5X12. Post-Intervention Lesion Assessment The intervention was successful. Pre-interventional TIMI flow is 3. Post-intervention TIMI flow is 3. No complications occurred at this lesion. Ultrasound (IVUS) was performed on the lesion post PCI using a CATHETER OPTICROSS HD. Stent well apposed. There is a 0% residual stenosis post intervention.  Mid RCA lesion Stent Lesion crossed with guidewire using a WIRE ASAHI PROWATER 180CM. Pre-stent angioplasty was not performed. A drug-eluting stent was successfully placed using a STENT ONYX FRONTIER 4.0X15. Stent strut is well apposed. Post-stent angioplasty was performed using a BALLN   SAPPHIRE 4.5X12. Post-Intervention Lesion Assessment The intervention was successful. Pre-interventional TIMI flow is 3. Post-intervention TIMI flow is 3. No complications occurred at this lesion. Ultrasound (IVUS) was performed on the lesion post PCI using a CATHETER OPTICROSS HD. Stent well apposed. There is a 0% residual stenosis post intervention.   STRESS TESTS  MYOCARDIAL PERFUSION IMAGING 11/08/2015  Narrative  Nuclear stress EF: 47%. The left ventricular ejection fraction is mildly decreased (45-54%).  There was no ST segment deviation noted during stress.  This is a low risk study.  There is no evidence of ischemia or previous infarction   ECHOCARDIOGRAM  ECHOCARDIOGRAM COMPLETE 02/27/2022  Narrative ECHOCARDIOGRAM REPORT    Patient Name:   COWEN PESQUEIRA Date of  Exam: 02/27/2022 Medical Rec #:  [id number]        Height:        70.0 in Accession #:    [id number]       Weight:       305.0 lb Date of Birth:  05-05-1956        BSA:          2.497 m Patient Age:    65 years         BP:           123/73 mmHg Patient Gender: M                HR:           72 bpm. Exam Location:  [address]  Procedure: 2D Echo, Cardiac Doppler, Color Doppler and Strain Analysis  Indications:    I50.30 heart failure with preserved EF  History:        Patient has prior history of Echocardiogram examinations, most recent 02/03/2021. CAD, AAA, Pulmonary embolus, Signs/Symptoms:Dyspnea; Risk Factors:Diabetes, Hypertension and Dyslipidemia.  Sonographer:    Clearence Ped RCS Referring Phys: [id number] Hilliary Jock A Marili Vader  IMPRESSIONS   1. Left ventricular ejection fraction, by estimation, is 60 to 65%. The left ventricle has normal function. The left ventricle has no regional wall motion abnormalities. Left ventricular diastolic parameters are consistent with Grade I diastolic dysfunction (impaired relaxation). The average left ventricular global longitudinal strain is -23.7 %. The global longitudinal strain is normal. 2. Right ventricular systolic function is normal. The right ventricular size is normal. 3. The mitral valve is normal in structure. No evidence of mitral valve regurgitation. No evidence of mitral stenosis. 4. The aortic valve is tricuspid. There is mild calcification of the aortic valve. There is mild thickening of the aortic valve. Aortic valve regurgitation is not visualized. Aortic valve sclerosis is present, with no evidence of aortic valve stenosis. 5. The inferior vena cava is normal in size with greater than 50% respiratory variability, suggesting right atrial pressure of 3 mmHg.  Comparison(s): No significant change from prior study. Prior images reviewed side by side.  FINDINGS Left Ventricle: Left ventricular ejection fraction, by estimation, is 60 to 65%. The left ventricle has normal function. The left  ventricle has no regional wall motion abnormalities. The average left ventricular global longitudinal strain is -23.7 %. The global longitudinal strain is normal. The left ventricular internal cavity size was normal in size. There is no left ventricular hypertrophy. Left ventricular diastolic parameters are consistent with Grade I diastolic dysfunction (impaired relaxation).  Right Ventricle: The right ventricular size is normal. No increase in right ventricular wall thickness. Right ventricular systolic function is normal.  Left Atrium: Left atrial size was normal in size.  Right Atrium: Right atrial size was normal in size.  Pericardium: There is no evidence of pericardial effusion.  Mitral Valve: The mitral valve is normal in structure. No evidence of mitral valve regurgitation. No evidence of mitral valve stenosis.  Tricuspid Valve: The tricuspid valve is normal in structure. Tricuspid valve regurgitation is trivial. No evidence of tricuspid stenosis.  Aortic Valve: The aortic valve is tricuspid. There is mild calcification of the aortic valve. There is mild thickening of the aortic valve. Aortic valve regurgitation is not visualized. Aortic valve sclerosis is present, with no evidence of aortic valve stenosis.  Pulmonic Valve: The pulmonic valve was normal in structure. Pulmonic valve regurgitation is not visualized. No evidence of pulmonic stenosis.  Aorta: The aortic  root is normal in size and structure.  Venous: The inferior vena cava is normal in size with greater than 50% respiratory variability, suggesting right atrial pressure of 3 mmHg.  IAS/Shunts: No atrial level shunt detected by color flow Doppler.   LEFT VENTRICLE PLAX 2D LVIDd:         3.90 cm   Diastology LVIDs:         2.90 cm   LV e' medial:    5.11 cm/s LV PW:         1.00 cm   LV E/e' medial:  19.4 LV IVS:        0.90 cm   LV e' lateral:   6.74 cm/s LVOT diam:     2.10 cm   LV E/e' lateral: 14.7 LV SV:          86 LV SV Index:   34        2D Longitudinal Strain LVOT Area:     3.46 cm  2D Strain GLS (A2C):   -22.0 % 2D Strain GLS (A3C):   -25.8 % 2D Strain GLS (A4C):   -23.3 % 2D Strain GLS Avg:     -23.7 %  RIGHT VENTRICLE RV Basal diam:  3.40 cm RV S prime:     11.40 cm/s TAPSE (M-mode): 2.0 cm  LEFT ATRIUM             Index        RIGHT ATRIUM           Index LA diam:        4.30 cm 1.72 cm/m   RA Area:     12.60 cm LA Vol (A2C):   65.6 ml 26.27 ml/m  RA Volume:   25.90 ml  10.37 ml/m LA Vol (A4C):   50.5 ml 20.22 ml/m LA Biplane Vol: 58.7 ml 23.50 ml/m AORTIC VALVE LVOT Vmax:   115.00 cm/s LVOT Vmean:  82.800 cm/s LVOT VTI:    0.247 m  AORTA Ao Root diam: 3.50 cm Ao Asc diam:  3.50 cm  MITRAL VALVE MV Area (PHT):              SHUNTS MV Decel Time:              Systemic VTI:  0.25 m MV E velocity: 99.00 cm/s   Systemic Diam: 2.10 cm MV A velocity: 133.00 cm/s MV E/A ratio:  0.74  Donato Schultz MD Electronically signed by Donato Schultz MD Signature Date/Time: 02/27/2022/11:45:09 AM    Final              Recent Labs: 10/31/2021: Magnesium 1.9 01/24/2022: Pro B Natriuretic peptide (BNP) 15.0 09/03/2022: ALT 33; BUN 18; Creatinine, Ser 1.46; Hemoglobin 14.8; Platelets 210.0; Potassium 4.1; Sodium 139; TSH 1.27  Recent Lipid Panel    Component Value Date/Time   CHOL 118 09/03/2022 1106   CHOL 147 08/15/2018 1001   TRIG 258.0 (H) 09/03/2022 1106   HDL 40.80 09/03/2022 1106   HDL 54 08/15/2018 1001   CHOLHDL 3 09/03/2022 1106   VLDL 51.6 (H) 09/03/2022 1106   LDLCALC 54 08/15/2018 1001   LDLDIRECT 44.0 09/03/2022 1106   Physical Exam:    VS:  BP 110/60   Pulse 71   Ht 6' (1.829 m)   Wt 298 lb (135.2 kg)   SpO2 94%   BMI 40.42 kg/m     Wt Readings from Last 3 Encounters:  10/03/22 298 lb (135.2 kg)  09/20/22 295  lb (133.8 kg)  09/03/22 295 lb (133.8 kg)    Gen: No distress, morbid obesity Neck: No JVD Cardiac: No Rubs or Gallops, soft systolic murmur  distant heart sounds, +2 radial pulses Respiratory: Clear to auscultation bilaterally, normal effort, normal  respiratory rate GI: Soft, nontender, non-distended  MS: +1 bilateral edema;  moves all extremities Integument: Skin feels warm Neuro:  At time of evaluation, alert and oriented to person/place/time/situation  Psych: Normal affect, patient feels   ASSESSMENT:    1. Coronary artery disease involving native coronary artery of native heart without angina pectoris   2. Hypertension associated with diabetes   3. Abdominal aortic aneurysm (AAA), 30-34 mm diameter   4. Aortic atherosclerosis   5. Chronic heart failure with preserved ejection fraction   6. 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 Alcohol use disorder - biggest improvement has been seen with decrease alcohol consumption; he will got back to cutting the wine consumption - Diuretics: Torsemide and Jardiance , had diarrhea on MRA that resolved with stopping will not rechallenge; diarrhea has represented; given his CKD and baseline creatinine have noted increased his diuretic; if worsening leg swelling after alcohol cessation increase to 80 of torsemide - adding torsemide 20 mg as a PRN for LE swelling or SOB - prior hx of PE: he will wear compression stockings especially before his 8 hr flight  Former Tobacco Abuse - as per primary  OSA on CPAP - has upcoming appt with Dr. Mayford Knife  CAD s/p PCI 1/23 HLD Aortic athersclerosis - LDL goal < 55 - ASA mg 81 PO daily on zetia and statin - No angina; no real symptoms (SOB did not improve with intervention), working to de-escalate CCB as he has noted fatigue and low BP  AAA -  ULN; will repeat imaging in 2025  September f/u with his wife then 6 months    Medication Adjustments/Labs and Tests Ordered: Current medicines are reviewed at length with the patient today.  Concerns  regarding medicines are outlined above.  No orders of the defined types were placed in this encounter.    Meds ordered this encounter  Medications   diltiazem (CARDIZEM CD) 180 MG 24 hr capsule    Sig: Take 1 capsule (180 mg total) by mouth daily.    Dispense:  90 capsule    Refill:  3   torsemide (DEMADEX) 20 MG tablet    Sig: Take 1 tablet (20 mg total) by mouth daily as needed.    Dispense:  30 tablet    Refill:  11     Patient Instructions  Medication Instructions:  Your physician has recommended you make the following change in your medication:  DECREASE: cardizem (Diltiazem) to 180 mg by mouth once daily  START: Torsemide 20 mg by mouth once daily as needed  *If you need a refill on your cardiac medications before your next appointment, please call your pharmacy*   Lab Work: NONE If you have labs (blood work) drawn today and your tests are completely normal, you will receive your results only by: MyChart Message (if you have MyChart) OR A paper copy in the mail If you have any lab test that is abnormal or we need to change your treatment, we will call you to review the results.   Testing/Procedures: NONE   Follow-Up:Sept 2024 At [facility], you and your health needs are  our priority.  As part of our continuing mission to provide you with exceptional heart care, we have created designated Provider Care Teams.  These Care Teams include your primary Cardiologist (physician) and Advanced Practice Providers (APPs -  Physician Assistants and Nurse Practitioners) who all work together to provide you with the care you need, when you need it.   Provider:   Christell Constant, MD        Signed, [name] Constant, MD  10/03/2022 11:10 AM    [facility]

## 2022-10-03 ENCOUNTER — Encounter: Payer: Self-pay | Admitting: Internal Medicine

## 2022-10-03 ENCOUNTER — Ambulatory Visit: Payer: Medicare Other | Attending: Internal Medicine | Admitting: Internal Medicine

## 2022-10-03 VITALS — BP 110/60 | HR 71 | Ht 72.0 in | Wt 298.0 lb

## 2022-10-03 DIAGNOSIS — I714 Abdominal aortic aneurysm, without rupture, unspecified: Secondary | ICD-10-CM

## 2022-10-03 DIAGNOSIS — G4733 Obstructive sleep apnea (adult) (pediatric): Secondary | ICD-10-CM | POA: Diagnosis present

## 2022-10-03 DIAGNOSIS — I152 Hypertension secondary to endocrine disorders: Secondary | ICD-10-CM

## 2022-10-03 DIAGNOSIS — I2510 Atherosclerotic heart disease of native coronary artery without angina pectoris: Secondary | ICD-10-CM

## 2022-10-03 DIAGNOSIS — I700 Atherosclerosis of aorta: Secondary | ICD-10-CM | POA: Diagnosis present

## 2022-10-03 DIAGNOSIS — E1159 Type 2 diabetes mellitus with other circulatory complications: Secondary | ICD-10-CM

## 2022-10-03 DIAGNOSIS — I5032 Chronic diastolic (congestive) heart failure: Secondary | ICD-10-CM | POA: Diagnosis present

## 2022-10-03 MED ORDER — TORSEMIDE 20 MG PO TABS: 20.0000 mg | ORAL_TABLET | Freq: Every day | ORAL | 11 refills | Status: DC | PRN

## 2022-10-03 MED ORDER — DILTIAZEM HCL ER COATED BEADS 180 MG PO CP24: 180.0000 mg | ORAL_CAPSULE | Freq: Every day | ORAL | 3 refills | Status: DC

## 2022-10-03 NOTE — Patient Instructions (Signed)
Medication Instructions:  Your physician has recommended you make the following change in your medication:  DECREASE: cardizem (Diltiazem) to 180 mg by mouth once daily  START: Torsemide 20 mg by mouth once daily as needed  *If you need a refill on your cardiac medications before your next appointment, please call your pharmacy*   Lab Work: NONE If you have labs (blood work) drawn today and your tests are completely normal, you will receive your results only by: MyChart Message (if you have MyChart) OR A paper copy in the mail If you have any lab test that is abnormal or we need to change your treatment, we will call you to review the results.   Testing/Procedures: NONE   Follow-Up:Sept 2024 At [facility], you and your health needs are our priority.  As part of our continuing mission to provide you with exceptional heart care, we have created designated Provider Care Teams.  These Care Teams include your primary Cardiologist (physician) and Advanced Practice Providers (APPs -  Physician Assistants and Nurse Practitioners) who all work together to provide you with the care you need, when you need it.   Provider:   Christell Constant, MD

## 2022-10-08 ENCOUNTER — Ambulatory Visit: Payer: Medicare Other | Attending: Cardiology | Admitting: Cardiology

## 2022-10-08 ENCOUNTER — Telehealth: Payer: Self-pay | Admitting: *Deleted

## 2022-10-08 ENCOUNTER — Encounter: Payer: Self-pay | Admitting: Cardiology

## 2022-10-08 VITALS — BP 118/62 | HR 72 | Ht 72.0 in | Wt 294.2 lb

## 2022-10-08 DIAGNOSIS — I152 Hypertension secondary to endocrine disorders: Secondary | ICD-10-CM | POA: Insufficient documentation

## 2022-10-08 DIAGNOSIS — G4733 Obstructive sleep apnea (adult) (pediatric): Secondary | ICD-10-CM

## 2022-10-08 DIAGNOSIS — E1159 Type 2 diabetes mellitus with other circulatory complications: Secondary | ICD-10-CM

## 2022-10-08 DIAGNOSIS — I2510 Atherosclerotic heart disease of native coronary artery without angina pectoris: Secondary | ICD-10-CM

## 2022-10-08 NOTE — Patient Instructions (Signed)
Medication Instructions:  Your physician recommends that you continue on your current medications as directed. Please refer to the Current Medication list given to you today.  *If you need a refill on your cardiac medications before your next appointment, please call your pharmacy*   Lab Work: None.  If you have labs (blood work) drawn today and your tests are completely normal, you will receive your results only by: MyChart Message (if you have MyChart) OR A paper copy in the mail If you have any lab test that is abnormal or we need to change your treatment, we will call you to review the results.   Testing/Procedures: None.   Follow-Up: At [facility], you and your health needs are our priority.  As part of our continuing mission to provide you with exceptional heart care, we have created designated Provider Care Teams.  These Care Teams include your primary Cardiologist (physician) and Advanced Practice Providers (APPs -  Physician Assistants and Nurse Practitioners) who all work together to provide you with the care you need, when you need it.  We recommend signing up for the patient portal called "MyChart".  Sign up information is provided on this After Visit Summary.  MyChart is used to connect with patients for Virtual Visits (Telemedicine).  Patients are able to view lab/test results, encounter notes, upcoming appointments, etc.  Non-urgent messages can be sent to your provider as well.   To learn more about what you can do with MyChart, go to [email or web address].    Your next appointment will be dependent on delivery of your equipment and it will be with:    Provider:   Dr. Armanda Magic, MD   Other Instructions Dr. Mayford Knife has placed orders for new settings and supplies for cpap machine. Someone from our sleep team or your DME company may reach out to you.

## 2022-10-08 NOTE — Progress Notes (Addendum)
Sleep Medicine CONSULT Note    Date:  10/08/2022   ID:  AYDENN GERVIN, DOB [birth year], MRN [id number]  PCP:  Ardith Dark, MD  Cardiologist: Christell Constant, MD   Chief Complaint  Patient presents with   New Patient (Initial Visit)    OSA     History of Present Illness:  DARICK FETTERS is a 67 y.o. male who is being seen today for the evaluation of OSA at the request of Riley Lam, MD.  This is a 67yo male with a hx of AAA, CAD, DM, HLD and OSA on CPAP.  He has not been followed by a Sleep Medicine physician and is now referred to Sleep Medicine for consultation for ongoing therapy for his OSA.    He has been on CPAP since the early 1990's.  He has a CPAP device that is not at least 68 years old yet.  He lives between [facility] and [place]  He uses [employer] for his DME. He is doing well with his PAP device and thinks that he has gotten used to it.  He tolerates the nasal pillow mask and feels the pressure is adequate.  He goes to bed at 10 to 11pm and gets up at 6am but gets up frequently at night to urinate. He feels tired when he gets up still and during the day as well.  He does get some mouth and nose dryness from time to time.   Past Medical History:  Diagnosis Date   Abdominal aortic aneurysm (AAA), 30-34 mm diameter 06/01/2017   Nml w/ greatest dm 3 cm US Aorta 06/2016, Abd [place] 05/01/2017 showed infrarenal dilatation at 3.4 cm - rec repeat US in 3 yrs.   Allergy    "pollen"   Arthritis    Cataract    removed,bilateral   Cervical disc disease 12/26/2011   Coronary artery disease involving native coronary artery of native heart without angina pectoris 12/01/2016   He had coronary angiography in 2011. There was irregularity within the LAD with up to 30% narrowing. The myocardial perfusion imaging done 11/07/2015 did not demonstrate any evidence of ischemia with a low risk nuclear stress test other than EF estimated at 47%. Chest CT 01/25/2016 showed diffuse  coronary artery calcifications with heavy calcifications in the LAD. Pt seen by cardiology Dr. Mendel Ryder    Diabetes mellitus    Difficult intubation 04/05/2014   Dyslipidemia    Family history of anesthesia complication    pt states took days for his father to awaken after mask was used    Fatty liver 06/01/2017   [place] 05/01/2017   GERD (gastroesophageal reflux disease)    has been having acid reflux since recent endoscopy    H/O hiatal hernia    repair with lap band, now has ventral hernia midline abd   Hearing loss-aides 03/26/2012   Hyperlipidemia    Hypertension    Lapband APS + hiatal hernia repair Oct 2015 04/05/2014   Morbid obesity    OSA (obstructive sleep apnea)    Rotator cuff tear    Seasonal allergies    Shortness of breath    pt states related to high BP meds with extended walking or climbling stairs   Sleep apnea     Past Surgical History:  Procedure Laterality Date   BIOPSY  09/20/2022   Procedure: BIOPSY;  Surgeon: Lemar Lofty., MD;  Location: [facility];  Service: Gastroenterology;;   CARDIAC CATHETERIZATION  approx 3 years ago    COLONOSCOPY WITH PROPOFOL N/A 03/10/2015   Procedure: COLONOSCOPY WITH PROPOFOL;  Surgeon: Rachael Fee, MD;  Location: [facility] ENDOSCOPY;  Service: Endoscopy;  Laterality: N/A;   COLONOSCOPY WITH PROPOFOL N/A 09/20/2022   Procedure: COLONOSCOPY WITH PROPOFOL;  Surgeon: Meridee Score Netty Starring., MD;  Location: [facility] ENDOSCOPY;  Service: Gastroenterology;  Laterality: N/A;   colonscopy      2012   CORONARY ATHERECTOMY N/A 07/18/2021   Procedure: CORONARY ATHERECTOMY;  Surgeon: Corky Crafts, MD;  Location: [facility] INVASIVE CV LAB;  Service: Cardiovascular;  Laterality: N/A;   CORONARY STENT INTERVENTION N/A 07/18/2021   Procedure: CORONARY STENT INTERVENTION;  Surgeon: Corky Crafts, MD;  Location[facility] INVASIVE CV LAB;  Service: Cardiovascular;  Laterality: N/A;   ESOPHAGOGASTRODUODENOSCOPY (EGD) WITH PROPOFOL N/A 03/10/2015    Procedure: ESOPHAGOGASTRODUODENOSCOPY (EGD) WITH PROPOFOL;  Surgeon: Rachael Fee, MD;  Location: [facility] ENDOSCOPY;  Service: Endoscopy;  Laterality: N/A;   HERNIA REPAIR     with gastric banding   LAPAROSCOPIC GASTRIC BANDING N/A 04/05/2014   Procedure: LAPAROSCOPIC GASTRIC BANDING;  Surgeon: Valarie Merino, MD;  Location: [facility] ORS;  Service: General;  Laterality: N/A;   MENISCUS REPAIR Left    left knee torn meniscus   RIGHT/LEFT HEART CATH AND CORONARY ANGIOGRAPHY N/A 07/18/2021   Procedure: RIGHT/LEFT HEART CATH AND CORONARY ANGIOGRAPHY;  Surgeon: Corky Crafts, MD;  Location[facility] INVASIVE CV LAB;  Service: Cardiovascular;  Laterality: N/A;   VASECTOMY      Current Medications: Current Meds  Medication Sig   acetaminophen (TYLENOL) 500 MG tablet Take 500 mg by mouth every 6 (six) hours as needed (for pain.).   aspirin EC 81 MG tablet Take 81 mg by mouth daily. Swallow whole.   Cholecalciferol (VITAMIN D) 50 MCG (2000 UT) tablet Take 2,000 Units by mouth daily.   diltiazem (CARDIZEM CD) 180 MG 24 hr capsule Take 1 capsule (180 mg total) by mouth daily.   diphenhydrAMINE (BENADRYL) 25 MG tablet Take 25 mg by mouth every 6 (six) hours as needed for itching or allergies.   empagliflozin (JARDIANCE) 25 MG TABS tablet Take 1 tablet (25 mg total) by mouth daily before breakfast.   ezetimibe (ZETIA) 10 MG tablet Take 1 tablet (10 mg total) by mouth daily.   fluticasone (FLONASE) 50 MCG/ACT nasal spray Place 1 spray into both nostrils daily as needed for allergies.   glucose blood (TRUETEST TEST) test strip Use as instructed   ketoconazole (NIZORAL) 2 % cream Apply 1 Application topically daily as needed for irritation.   lisinopril (ZESTRIL) 20 MG tablet Take 1 tablet (20 mg total) by mouth daily.   loratadine (CLARITIN) 10 MG tablet Take 10 mg by mouth daily.   Multiple Vitamin (MULTIVITAMIN WITH MINERALS) TABS tablet Take 1 tablet by mouth in the morning.   potassium chloride SA  (KLOR-CON M) 20 MEQ tablet Take 60 mEq by mouth daily.   rosuvastatin (CRESTOR) 40 MG tablet TAKE ONE TABLET BY MOUTH ONE TIME DAILY   silver sulfADIAZINE (SSD) 1 % cream Apply 1 Application topically daily.   tamsulosin (FLOMAX) 0.4 MG CAPS capsule TAKE ONE CAPSULE BY MOUTH ONE TIME DAILY, 30 MINUTES AFTER THE SAME MEAL EACH DAY   torsemide (DEMADEX) 20 MG tablet Take 3 tablets (60 mg total) by mouth daily.   torsemide (DEMADEX) 20 MG tablet Take 1 tablet (20 mg total) by mouth daily as needed.   TRESIBA FLEXTOUCH 200 UNIT/ML FlexTouch Pen INJECT 56  UNITS INTO THE SKIN EVERY MORNING AND INJECT 56 UNITS INTO THE SKIN EVERY EVENING (Patient taking differently: 114 Units daily.)    Allergies:   Farxiga [dapagliflozin] and Penicillins   Social History   Socioeconomic History   Marital status: Married    Spouse name: Not on file   Number of children: 2   Years of education: 16   Highest education level: Bachelor's degree (e.g., BA, AB, BS)  Occupational History   Occupation: owner    Comment: Games developer   Occupation: Retired  Tobacco Use   Smoking status: Former    Packs/day: 4.00    Years: 20.00    Additional pack years: 0.00    Total pack years: 80.00    Types: Cigarettes    Start date: 07/19/1973    Quit date: 12/13/1991    Years since quitting: 30.8   Smokeless tobacco: Never  Vaping Use   Vaping Use: Never used  Substance and Sexual Activity   Alcohol use: Yes    Alcohol/week: 3.0 - 5.0 standard drinks of alcohol    Types: 3 - 5 Glasses of wine per week    Comment: one glass of wine nightly   Drug use: No   Sexual activity: Not on file  Other Topics Concern   Not on file  Social History Narrative   Not on file   Social Determinants of Health   Financial Resource Strain: Not on file  Food Insecurity: Not on file  Transportation Needs: Not on file  Physical Activity: Not on file  Stress: Not on file  Social Connections: Not on file     Family History:  The  patient's family history includes Clotting disorder in his mother; Hypertension in his brother; Mesothelioma in his father; Skin cancer in his brother.   ROS:   Please see the history of present illness.    ROS All other systems reviewed and are negative.      No data to display             PHYSICAL EXAM:   VS:  BP 118/62   Pulse 72   Ht 6' (1.829 m)   Wt 294 lb 3.2 oz (133.4 kg)   SpO2 98%   BMI 39.90 kg/m    GEN: Well nourished, well developed, in no acute distress  HEENT: normal  Neck: no JVD, carotid bruits, or masses Cardiac: RRR; no murmurs, rubs, or gallops,no edema.  Intact distal pulses bilaterally.  Respiratory:  clear to auscultation bilaterally, normal work of breathing GI: soft, nontender, nondistended, + BS MS: no deformity or atrophy  Skin: warm and dry, no rash Neuro:  Alert and Oriented x 3, Strength and sensation are intact Psych: euthymic mood, full affect  Wt Readings from Last 3 Encounters:  10/08/22 294 lb 3.2 oz (133.4 kg)  10/03/22 298 lb (135.2 kg)  09/20/22 295 lb (133.8 kg)      Studies/Labs Reviewed:   PAP compliance download  Recent Labs: 10/31/2021: Magnesium 1.9 01/24/2022: Pro B Natriuretic peptide (BNP) 15.0 09/03/2022: ALT 33; BUN 18; Creatinine, Ser 1.46; Hemoglobin 14.8; Platelets 210.0; Potassium 4.1; Sodium 139; TSH 1.27   Additional studies/ records that were reviewed today include:  none    ASSESSMENT:    1. OSA (obstructive sleep apnea)   2. Hypertension associated with diabetes      PLAN:  In order of problems listed above:  OSA - The patient is tolerating PAP therapy well without any problems. The PAP download  performed by his DME was personally reviewed and interpreted by me today and showed an AHI of 0.4/hr on auto CPAP from 4 to 20 cm H2O with 43% compliance in using more than 4 hours nightly.  The patient has been using and benefiting from PAP use and will continue to benefit from therapy.  -he tells me he  is 100% compliant but not showing up here [place] then the other 1/2 of the year in [place]. -He tells me that he would rather his lower end pressure be set at 10cm H2O instead of 4cm H2O to get more air when it first starts out -I will order a new ResMed auto CPAP from 10 to 20cm H2O because he needs a second one that it is not old.  2.  HTN -BP controlled on exam today -continue prescription drug management with Cardizem CD  daily, Lisinopril  daily with PRN refills   Time Spent: 20 minutes total time of encounter, including 15 minutes spent in face-to-face patient care on the date of this encounter. This time includes coordination of care and counseling regarding above mentioned problem list. Remainder of non-face-to-face time involved reviewing chart documents/testing relevant to the patient encounter and documentation in the medical record. I have independently reviewed documentation from referring provider  Medication Adjustments/Labs and Tests Ordered: Current medicines are reviewed at length with the patient today.  Concerns regarding medicines are outlined above.  Medication changes, Labs and Tests ordered today are listed in the Patient Instructions below.  There are no Patient Instructions on file for this visit.   Signed, Armanda Magic, MD  10/08/2022 2:15 PM    [facility] [address] [place], [place], [place]  [address] Phone: [phone]; Fax: [phone]

## 2022-10-08 NOTE — Telephone Encounter (Signed)
Order placed to [facility] via community message. 

## 2022-10-08 NOTE — Telephone Encounter (Signed)
-----   Message from Luellen Pucker, RN sent at 10/08/2022  2:30 PM EDT ----- Regarding: CPAP Dr. Mayford Knife would like Resmed auto cpap for this patient at 10-20 cm H20, as well as mask of choice and supplies ordered.  She would like follow up scheduled with him after delivery.  Thanks! Alcario Drought

## 2022-10-09 ENCOUNTER — Other Ambulatory Visit: Payer: Self-pay | Admitting: Family Medicine

## 2022-10-10 ENCOUNTER — Telehealth: Payer: Self-pay | Admitting: Hematology and Oncology

## 2022-10-10 NOTE — Telephone Encounter (Signed)
Patient called to cancel appointments due to medication changes. Stated that PCP will continue care.

## 2022-10-11 ENCOUNTER — Inpatient Hospital Stay: Payer: Medicare Other

## 2022-10-11 ENCOUNTER — Inpatient Hospital Stay: Payer: Medicare Other | Admitting: Hematology and Oncology

## 2022-10-13 ENCOUNTER — Other Ambulatory Visit: Payer: Self-pay | Admitting: Internal Medicine

## 2022-10-15 ENCOUNTER — Other Ambulatory Visit: Payer: Self-pay | Admitting: Podiatry

## 2022-10-15 DIAGNOSIS — E1149 Type 2 diabetes mellitus with other diabetic neurological complication: Secondary | ICD-10-CM

## 2022-10-15 DIAGNOSIS — L84 Corns and callosities: Secondary | ICD-10-CM

## 2022-10-15 DIAGNOSIS — E11621 Type 2 diabetes mellitus with foot ulcer: Secondary | ICD-10-CM

## 2022-10-16 ENCOUNTER — Encounter: Payer: Self-pay | Admitting: Pulmonary Disease

## 2022-10-16 ENCOUNTER — Ambulatory Visit (INDEPENDENT_AMBULATORY_CARE_PROVIDER_SITE_OTHER): Payer: Medicare Other | Admitting: Pulmonary Disease

## 2022-10-16 VITALS — BP 124/64 | HR 76 | Temp 98.0°F | Ht 72.0 in | Wt 297.8 lb

## 2022-10-16 DIAGNOSIS — R0609 Other forms of dyspnea: Secondary | ICD-10-CM

## 2022-10-16 MED ORDER — BREZTRI AEROSPHERE 160-9-4.8 MCG/ACT IN AERO: 2.0000 | INHALATION_SPRAY | Freq: Two times a day (BID) | RESPIRATORY_TRACT | 0 refills | Status: DC

## 2022-10-16 NOTE — Progress Notes (Signed)
@Patient  ID: [name], male    DOB: 06-Aug-1955, 67 y.o.   MRN: [id number]  Chief Complaint  Patient presents with   New Patient (Initial Visit)    Former Dr Marchelle Gearing patient, states O2 sats yesterday showed 89-90%, but was unsure if that was correct, still having sob with exertion    Referring provider: Ardith Dark, MD  HPI:   67 y.o. man with history of CAD, history of COVID infection in the past, presents for evaluation of dyspnea on exertion.  Most recent pulmonary note from Ames Dura, NP reviewed.  Most recent pulm note from prior pulmonologist Dr. Marchelle Gearing reviewed.  Patient with long history of dyspnea on exertion.  Worse in the couple years ago.  Eventually was found to have coronary disease.  Had stent placed.  This improved for a bit.  But still with residual shortness of breath over the last year to 15 months.  Sometimes get sweaty, breaks out in sweat, she is very short of breath.  Walking long distances.  Or any level of exertion up stairs or carrying things.  Admittedly he is less active over time.  Does have some musculoskeletal issues, arthritis etc. that limits his activity due to pain etc.  Reviewed serial chest images, most recently CT scan 01/2022 are clear without any signs of ILD or otherwise.  He had a PE in the past but completed Xarelto and had subsequent VQ scan that was negative.  Reviewed serial chest x-rays most recently 2023 but dating back to at least 2018 and on my review interpretation demonstrate clear lungs with elevated left hemidiaphragm suspicious for diaphragmatic dysfunction/paralyzation.  He has not improved with inhalers in the past.  Tried Advair HFA and Diskus, Trelegy.  Discussed trial of Breztri to see if HFA and triple inhaled therapy would be more effective.  Discussed that to date we have no evidence that lungs are contributing to her shortness of breath.  Reviewed PFTs 2022 that are all normal, did show severely reduced ERV and  borderline TLC suspicious for contribution of borderline extrathoracic restriction related to habitus versus diaphragmatic paralyzation as discussed above.  Questionaires / Pulmonary Flowsheets:   ACT:      No data to display          MMRC: mMRC Dyspnea Scale mMRC Score  07/27/2021  2:43 PM 3    Epworth:     01/13/2014    9:54 AM  Results of the Epworth flowsheet  Sitting and reading 2  Watching TV 3  Sitting, inactive in a public place (e.g. a theatre or a meeting) 1  As a passenger in a car for an hour without a break 2  Lying down to rest in the afternoon when circumstances permit 2  Sitting and talking to someone 1  Sitting quietly after a lunch without alcohol 0  In a car, while stopped for a few minutes in traffic 0  Total score 11    Tests:   FENO:  No results found for: "NITRICOXIDE"  PFT:    Latest Ref Rng & Units 02/15/2021   11:37 AM  PFT Results  FVC-Pre L 2.98   FVC-Predicted Pre % 60   FVC-Post L 3.33   FVC-Predicted Post % 67   Pre FEV1/FVC % % 84   Post FEV1/FCV % % 84   FEV1-Pre L 2.49   FEV1-Predicted Pre % 67   FEV1-Post L 2.80   DLCO uncorrected ml/min/mmHg 30.12   DLCO UNC% %  105   DLVA Predicted % 143   TLC L 5.84   TLC % Predicted % 78   RV % Predicted % 109   Personally reviewed and interpreted as spirometry suggestive of moderate restriction versus air trapping, no bronchodilator response, borderline but acceptable total lung capacity, severely reduced ERV, overall normal lung volumes, normal DLCO  WALK:     02/02/2021    3:11 PM  SIX MIN WALK  Supplimental Oxygen during Test? (L/min) No  Tech Comments: pt walked at an average pace completing all required laps having complaints of mild SOB.    Imaging: No results found. Personally reviewed and as per EMR Lab Results: Personally reviewed CBC    Component Value Date/Time   WBC 4.5 09/03/2022 1106   RBC 4.65 09/03/2022 1106   HGB 14.8 09/03/2022 1106   HGB 15.1  04/25/2022 1405   HGB 14.2 07/14/2021 1543   HCT 44.1 09/03/2022 1106   HCT 42.6 07/14/2021 1543   PLT 210.0 09/03/2022 1106   PLT 169 04/25/2022 1405   PLT 201 07/14/2021 1543   MCV 95.0 09/03/2022 1106   MCV 95 07/14/2021 1543   MCH 33.2 04/25/2022 1405   MCHC 33.5 09/03/2022 1106   RDW 14.5 09/03/2022 1106   RDW 12.4 07/14/2021 1543   LYMPHSABS 1.6 04/25/2022 1405   LYMPHSABS 1.8 08/15/2018 1001   MONOABS 0.4 04/25/2022 1405   EOSABS 0.1 04/25/2022 1405   EOSABS 0.1 08/15/2018 1001   BASOSABS 0.0 04/25/2022 1405   BASOSABS 0.0 08/15/2018 1001    BMET    Component Value Date/Time   NA 139 09/03/2022 1106   NA 140 11/15/2021 1444   K 4.1 09/03/2022 1106   CL 98 09/03/2022 1106   CO2 30 09/03/2022 1106   GLUCOSE 200 (H) 09/03/2022 1106   BUN 18 09/03/2022 1106   BUN 17 11/15/2021 1444   CREATININE 1.46 09/03/2022 1106   CREATININE 1.47 (H) 04/25/2022 1405   CREATININE 1.33 (H) 01/03/2016 1505   CALCIUM 9.0 09/03/2022 1106   GFRNONAA 52 (L) 04/25/2022 1405   GFRNONAA 60 10/21/2013 1546   GFRAA 75 08/15/2018 1001   GFRAA 70 10/21/2013 1546    BNP    Component Value Date/Time   BNP 3.6 06/30/2016 1039    ProBNP    Component Value Date/Time   PROBNP 15.0 01/24/2022 1620    Specialty Problems       Pulmonary Problems   OSA (obstructive sleep apnea)   COVID-19 long hauler manifesting chronic dyspnea   Paraseptal emphysema (HCC)   Allergic rhinitis    Allergies  Allergen Reactions   Farxiga [Dapagliflozin] Diarrhea and Itching   Penicillins Nausea Only    Upset stomach    Immunization History  Administered Date(s) Administered   Influenza, High Dose Seasonal PF 04/30/2018   Influenza,inj,Quad PF,6+ Mos 06/30/2016, 03/14/2017   Moderna Sars-Covid-2 Vaccination 10/14/2020   PFIZER(Purple Top)SARS-COV-2 Vaccination 09/10/2019, 10/05/2019   Pneumococcal Polysaccharide-23 10/09/2012   Tdap 02/09/2015    Past Medical History:  Diagnosis Date    Abdominal aortic aneurysm (AAA), 30-34 mm diameter (HCC) 06/01/2017   Nml w/ greatest dm 3 cm US Aorta 06/2016, Abd [place] 05/01/2017 showed infrarenal dilatation at 3.4 cm - rec repeat US in 3 yrs.   Allergy    "pollen"   Arthritis    Cataract    removed,bilateral   Cervical disc disease 12/26/2011   Coronary artery disease involving native coronary artery of native heart without angina pectoris 12/01/2016  He had coronary angiography in 2011. There was irregularity within the LAD with up to 30% narrowing. The myocardial perfusion imaging done 11/07/2015 did not demonstrate any evidence of ischemia with a low risk nuclear stress test other than EF estimated at 47%. Chest CT 01/25/2016 showed diffuse coronary artery calcifications with heavy calcifications in the LAD. Pt seen by cardiology Dr. Mendel Ryder    Diabetes mellitus    Difficult intubation 04/05/2014   Dyslipidemia    Family history of anesthesia complication    pt states took days for his father to awaken after mask was used    Fatty liver 06/01/2017   [place] 05/01/2017   GERD (gastroesophageal reflux disease)    has been having acid reflux since recent endoscopy    H/O hiatal hernia    repair with lap band, now has ventral hernia midline abd   Hearing loss-aides 03/26/2012   Hyperlipidemia    Hypertension    Lapband APS + hiatal hernia repair Oct 2015 04/05/2014   Morbid obesity (HCC)    OSA (obstructive sleep apnea)    Rotator cuff tear    Seasonal allergies    Shortness of breath    pt states related to high BP meds with extended walking or climbling stairs   Sleep apnea     Tobacco History: Social History   Tobacco Use  Smoking Status Former   Packs/day: 4.00   Years: 20.00   Additional pack years: 0.00   Total pack years: 80.00   Types: Cigarettes   Start date: 07/19/1973   Quit date: 12/13/1991   Years since quitting: 30.8  Smokeless Tobacco Never   Counseling given: Not Answered   Continue to not  smoke  Outpatient Encounter Medications as of 10/16/2022  Medication Sig   acetaminophen (TYLENOL) 500 MG tablet Take 500 mg by mouth every 6 (six) hours as needed (for pain.).   aspirin EC 81 MG tablet Take 81 mg by mouth daily. Swallow whole.   Budeson-Glycopyrrol-Formoterol (BREZTRI AEROSPHERE) 160-9-4.8 MCG/ACT AERO Inhale 2 puffs into the lungs in the morning and at bedtime.   Cholecalciferol (VITAMIN D) 50 MCG (2000 UT) tablet Take 2,000 Units by mouth daily.   diltiazem (CARDIZEM CD) 180 MG 24 hr capsule Take 1 capsule (180 mg total) by mouth daily.   diphenhydrAMINE (BENADRYL) 25 MG tablet Take 25 mg by mouth every 6 (six) hours as needed for itching or allergies.   empagliflozin (JARDIANCE) 25 MG TABS tablet Take 1 tablet (25 mg total) by mouth daily before breakfast.   ezetimibe (ZETIA) 10 MG tablet Take 1 tablet (10 mg total) by mouth daily.   fluticasone (FLONASE) 50 MCG/ACT nasal spray Place 1 spray into both nostrils daily as needed for allergies.   glucose blood (TRUETEST TEST) test strip Use as instructed   ketoconazole (NIZORAL) 2 % cream Apply 1 Application topically daily as needed for irritation.   lisinopril (ZESTRIL) 20 MG tablet TAKE ONE TABLET BY MOUTH ONE TIME DAILY   loratadine (CLARITIN) 10 MG tablet Take 10 mg by mouth daily.   Multiple Vitamin (MULTIVITAMIN WITH MINERALS) TABS tablet Take 1 tablet by mouth in the morning.   potassium chloride SA (KLOR-CON M) 20 MEQ tablet Take 60 mEq by mouth daily.   rosuvastatin (CRESTOR) 40 MG tablet TAKE ONE TABLET BY MOUTH ONE TIME DAILY   silver sulfADIAZINE (SSD) 1 % cream Apply 1 Application topically daily.   tamsulosin (FLOMAX) 0.4 MG CAPS capsule TAKE ONE CAPSULE BY MOUTH ONE  TIME DAILY 30 MINUTES AFTER THE SAME MEAL EACH DAY   torsemide (DEMADEX) 20 MG tablet Take 3 tablets (60 mg total) by mouth daily.   torsemide (DEMADEX) 20 MG tablet Take 1 tablet (20 mg total) by mouth daily as needed.   TRESIBA FLEXTOUCH 200  UNIT/ML FlexTouch Pen INJECT 56 UNITS INTO THE SKIN EVERY MORNING AND INJECT 56 UNITS INTO THE SKIN EVERY EVENING (Patient taking differently: 114 Units daily. Taking 114 units in morning only)   No facility-administered encounter medications on file as of 10/16/2022.     Review of Systems  Review of Systems  No chest pain exertion.  No orthopnea or PND.  Comprehensive review of systems otherwise negative. Physical Exam  BP 124/64 (BP Location: Right Arm, Patient Position: Sitting, Cuff Size: Normal)   Pulse 76   Temp 98 F (36.7 C) (Oral)   Ht 6' (1.829 m)   Wt 297 lb 12.8 oz (135.1 kg)   SpO2 100% Comment: RA- head probe  BMI 40.39 kg/m   Wt Readings from Last 5 Encounters:  10/16/22 297 lb 12.8 oz (135.1 kg)  10/08/22 294 lb 3.2 oz (133.4 kg)  10/03/22 298 lb (135.2 kg)  09/20/22 295 lb (133.8 kg)  09/03/22 295 lb (133.8 kg)    BMI Readings from Last 5 Encounters:  10/16/22 40.39 kg/m  10/08/22 39.90 kg/m  10/03/22 40.42 kg/m  09/20/22 40.01 kg/m  09/03/22 40.01 kg/m     Physical Exam Sitting in chair, no acute distress Eyes: EOMI, no icterus Neck: Supple, no JVP Pulmonary: Distant, clear Cardiovascular: Warm, trace edema bilaterally Abdomen: Distended, soft, bowel sounds present MSK: No synovitis, no joint effusion Neuro: Normal gait, no weakness Psych: Normal mood, full affect   Assessment & Plan:   Dyspnea on exertion:   Serial chest images clear including chest x-ray and CT scan most recently 01/2022.  Pulmonary function test 2022 within normal limits.  High suspicion for deconditioning and habitus contributing to his symptoms.  He does have elevation of elevated hemidiaphragm and borderline but acceptable total lung capacity, near mild restriction that is extrathoracic with low ERV which could be related to habitus as well as possible diaphragmatic injury/paralysis.  Trial of Breztri, today inhaler has not been helpful.  By enlarge bulk of symptoms felt  not to be related to pulmonary issues but other causes such as CAD, he has OSA, likely OHS given chronically elevated bicarbonate.  Will repeat pulmonary function tests.  Elevated left hemidiaphragm: Presently since 2018.  Suspect related to cervical spine arthritis and subsequent nerve damage.  No further workup or evaluation recommended at this time.   Return in about 3 months (around 01/15/2023).   Karren Burly, MD 10/16/2022   This appointment required 41 minutes of patient care (this includes precharting, chart review, review of results, face-to-face care, etc.).

## 2022-10-16 NOTE — Patient Instructions (Signed)
Nice to see you   Lets try a different inhaler, Breztri.  2 puffs in the morning, 2 puffs in the evening.  Rinse her mouth out thoroughly and spit with water after every use.  Lets see if a different delivery method will be more beneficial than the dry powder inhalers you have tried in the past.  I provided samples for 2 weeks.  If you find it beneficial in the next 7 to 14 days, please see me a message and I can prescribe this.  I ordered repeat pulmonary function test to further evaluate your symptoms.  These were normal in 2022 but lets reevaluate, I expect we will be similar but if not we can continue further evaluation of the abnormality if present.  These can be scheduled at your convenience.  Return to clinic in 3 months or sooner if needed with Dr. Judeth Horn

## 2022-10-19 ENCOUNTER — Other Ambulatory Visit: Payer: Self-pay | Admitting: Podiatry

## 2022-10-19 ENCOUNTER — Encounter: Payer: Self-pay | Admitting: Podiatry

## 2022-10-19 MED ORDER — DOXYCYCLINE HYCLATE 100 MG PO TABS
100.0000 mg | ORAL_TABLET | Freq: Two times a day (BID) | ORAL | 0 refills | Status: DC
Start: 2022-10-19 — End: 2022-10-29

## 2022-10-22 ENCOUNTER — Ambulatory Visit: Payer: Medicare Other

## 2022-10-22 ENCOUNTER — Ambulatory Visit (INDEPENDENT_AMBULATORY_CARE_PROVIDER_SITE_OTHER): Payer: Medicare Other | Admitting: Podiatry

## 2022-10-22 DIAGNOSIS — Z7901 Long term (current) use of anticoagulants: Secondary | ICD-10-CM

## 2022-10-22 DIAGNOSIS — M79675 Pain in left toe(s): Secondary | ICD-10-CM | POA: Diagnosis not present

## 2022-10-22 DIAGNOSIS — L02611 Cutaneous abscess of right foot: Secondary | ICD-10-CM

## 2022-10-22 DIAGNOSIS — L97519 Non-pressure chronic ulcer of other part of right foot with unspecified severity: Secondary | ICD-10-CM

## 2022-10-22 DIAGNOSIS — B351 Tinea unguium: Secondary | ICD-10-CM | POA: Diagnosis not present

## 2022-10-22 DIAGNOSIS — M79674 Pain in right toe(s): Secondary | ICD-10-CM | POA: Diagnosis not present

## 2022-10-22 DIAGNOSIS — E1149 Type 2 diabetes mellitus with other diabetic neurological complication: Secondary | ICD-10-CM

## 2022-10-22 DIAGNOSIS — E11621 Type 2 diabetes mellitus with foot ulcer: Secondary | ICD-10-CM | POA: Diagnosis not present

## 2022-10-22 DIAGNOSIS — L97522 Non-pressure chronic ulcer of other part of left foot with fat layer exposed: Secondary | ICD-10-CM | POA: Diagnosis not present

## 2022-10-22 DIAGNOSIS — L03031 Cellulitis of right toe: Secondary | ICD-10-CM

## 2022-10-22 MED ORDER — CIPROFLOXACIN HCL 500 MG PO TABS: 500.0000 mg | ORAL_TABLET | Freq: Two times a day (BID) | ORAL | 0 refills | Status: DC

## 2022-10-24 ENCOUNTER — Other Ambulatory Visit: Payer: Medicare Other

## 2022-10-24 ENCOUNTER — Ambulatory Visit: Payer: Medicare Other | Admitting: Hematology and Oncology

## 2022-10-24 NOTE — Progress Notes (Signed)
Subjective: Chief Complaint  Patient presents with   Diabetic Ulcer    Follow up right great toe - very red and swollen today    67 year old male presents for the above concerns.  He has sent a message saying his right big toe was getting more red and swollen he was started on doxycycline.  He states is about the same is not worse and does not any better.  No drainage or pus.  The right side is stable.  He is not endorse any fevers or chills.   He is asking for the nails to be trimmed today as they are causing discomfort.  Last A1c today was 8.4 on September 03, 2022  Objective: AAO x3, NAD DP/PT pulses palpable bilaterally, CRT less than 3 seconds On plantar aspects of bilateral hallux there is thick hyperkeratotic tissue with dried blood.  The right hallux has quite a bit of edema and erythema present of the toe but does not extend past the MPJ.  On the plantar aspect there is thick hyperkeratotic tissue overlying the wound with a blister just adjacent to this.  Is able to debride this today and large granular wound present on the plantar aspect the toe.  2 separate lesions but very close in proximity.  One is where the blister was at and what is the original ulceration.  I debrided the wound down to healthy, bleeding tissue.  There is no purulence.  There is no fluctuance or crepitation there is no malodor.  On the left side she will be debrided the wound to reveal underlying granular ulceration without any edema, erythema or signs of infection.  No probing to bone department or tunneling. Nails are hypertrophic, dystrophic with yellow, brown discoloration are causing discomfort 1-5 bilaterally. No pain with calf compression, swelling, warmth, erythema  Assessment: 67 year old male with bilateral hallux ulcerations; symptomatic onychomycosis  Plan: -All treatment options discussed with the patient including all alternatives, risks, complications.  -X-rays were obtained reviewed of the right  foot.  3 views of the foot were obtained.  There is no evidence of acute fracture or osteomyelitis at this time.  No soft tissue emphysema. -Medically necessary wound debridement was performed bilaterally.  I sharply debrided both of the lesions with a #312 with scalpel to debride nonviable devitalized tissue to promote wound healing.  Prior to debridement not able to measure the wound.  After debridement of the right side measured 1 x 1 cm with a depth of 0.1 cm.  There is also a separate wound slightly smaller than this as well underneath the ulcer where the blister was at.  There was no purulence to culture.  On the left side is more preulcerative with only superficial area skin breakdown.  There is no drainage or pus.  Minimal bleeding and hemostasis sheath and manual compression.  Patient applied followed by dressing.  Continue with daily dressing changes with Silvadene.  Offloading at all times. -Continue doxycycline.  Will add Cipro given erythema on the right side.  Monitor closely signs or symptoms of worsening infection and report to emergency room should any occur. -Debrided nails x 10 without any complications or bleeding. -Awaiting orthotics/custom sandals.  X-ray bilateral feet next appointment  Vivi Barrack DPM

## 2022-10-29 ENCOUNTER — Encounter: Payer: Self-pay | Admitting: Podiatry

## 2022-10-29 ENCOUNTER — Ambulatory Visit (INDEPENDENT_AMBULATORY_CARE_PROVIDER_SITE_OTHER): Payer: Medicare Other | Admitting: Podiatry

## 2022-10-29 ENCOUNTER — Ambulatory Visit: Payer: Medicare Other

## 2022-10-29 ENCOUNTER — Ambulatory Visit (INDEPENDENT_AMBULATORY_CARE_PROVIDER_SITE_OTHER): Payer: Medicare Other

## 2022-10-29 DIAGNOSIS — L97519 Non-pressure chronic ulcer of other part of right foot with unspecified severity: Secondary | ICD-10-CM | POA: Diagnosis not present

## 2022-10-29 DIAGNOSIS — L97522 Non-pressure chronic ulcer of other part of left foot with fat layer exposed: Secondary | ICD-10-CM | POA: Diagnosis not present

## 2022-10-29 DIAGNOSIS — E11621 Type 2 diabetes mellitus with foot ulcer: Secondary | ICD-10-CM | POA: Diagnosis not present

## 2022-10-29 MED ORDER — DOXYCYCLINE HYCLATE 100 MG PO TABS: 100.0000 mg | ORAL_TABLET | Freq: Two times a day (BID) | ORAL | 0 refills | Status: DC

## 2022-10-29 MED ORDER — CIPROFLOXACIN HCL 500 MG PO TABS: 500.0000 mg | ORAL_TABLET | Freq: Two times a day (BID) | ORAL | 0 refills | Status: DC

## 2022-10-29 NOTE — Progress Notes (Signed)
Subjective: Chief Complaint  Patient presents with   Foot Ulcer    Follow up ulcer hallux bilateral   "They look okay, I guess" Picking up diabetic shoe (sandals) today   67 year old male presents the office with above concerns.  He states that it is looking better and less swollen and red.  [place] is leaving out of the country on Wednesday.  Minimal bleeding on the bandage but no pus.  He does not report any fevers or chills.  No other concerns.  Objective: AAO x3, NAD DP/PT pulses palpable bilaterally, CRT less than 3 seconds On the plantar aspects of bilateral hallux hyperkeratotic tissue.  On the right foot upon debridement there is 1 small superficial granular wound still present but much improved compared to last week.  Decreased edema and erythema and only minimal still remains.  There is no fluctuance or crepitation but there is no malodor.  On the left hallux granular wound present after debridement which measures 0.4 x 0.4 x 0.2 cm without any surrounding erythema, ascending cellulitis.  No fluctuance or crepitation there is no monitor.  No pain with calf compression, swelling, warmth, erythema  Assessment: Bilateral ulcerations with resolving cellulitis  Plan: -All treatment options discussed with the patient including all alternatives, risks, complications.  -X-ray obtained and reviewed of the right foot.  There is no cortical changes suggest osteomyelitis at this time.  There is no soft tissue emphysema. -Medically necessary wound debridement is performed bilaterally.  Sharply debrided the hyperkeratotic tissue reveals granular wounds present bilaterally.  There is no probing, undermining or tunneling.  There is no probing to bone.  Continue with daily dressing changes and offloading.  He is going to go out of country on Wednesday, refill the antibiotics he has not taken with him just in case he needs them while out of the country. -Continue offloading. -I did not dispense his sandals  that were custom made as they were not appropriate offloading the big toes.  Will send these back. -Patient encouraged to call the office with any questions, concerns, change in symptoms.   *X-ray bilateral feet next appointment  Vivi Barrack DPM

## 2022-11-01 ENCOUNTER — Encounter: Payer: Self-pay | Admitting: Family Medicine

## 2022-11-02 NOTE — Telephone Encounter (Signed)
Would be a good option but recommend he check with insurance to see if they cover it.  [name]. [name], MD 11/02/2022 7:29 AM

## 2022-11-02 NOTE — Telephone Encounter (Signed)
Please see patient message and advise.

## 2022-12-04 ENCOUNTER — Ambulatory Visit (INDEPENDENT_AMBULATORY_CARE_PROVIDER_SITE_OTHER): Payer: Medicare Other | Admitting: Family Medicine

## 2022-12-04 ENCOUNTER — Encounter: Payer: Self-pay | Admitting: Family Medicine

## 2022-12-04 VITALS — BP 98/65 | HR 75 | Temp 97.3°F | Ht 72.0 in | Wt 293.4 lb

## 2022-12-04 DIAGNOSIS — Z9884 Bariatric surgery status: Secondary | ICD-10-CM

## 2022-12-04 DIAGNOSIS — I152 Hypertension secondary to endocrine disorders: Secondary | ICD-10-CM | POA: Diagnosis not present

## 2022-12-04 DIAGNOSIS — E1165 Type 2 diabetes mellitus with hyperglycemia: Secondary | ICD-10-CM | POA: Diagnosis not present

## 2022-12-04 DIAGNOSIS — Z7984 Long term (current) use of oral hypoglycemic drugs: Secondary | ICD-10-CM | POA: Diagnosis not present

## 2022-12-04 DIAGNOSIS — L570 Actinic keratosis: Secondary | ICD-10-CM | POA: Diagnosis not present

## 2022-12-04 DIAGNOSIS — E785 Hyperlipidemia, unspecified: Secondary | ICD-10-CM

## 2022-12-04 DIAGNOSIS — E1159 Type 2 diabetes mellitus with other circulatory complications: Secondary | ICD-10-CM | POA: Diagnosis not present

## 2022-12-04 DIAGNOSIS — E1169 Type 2 diabetes mellitus with other specified complication: Secondary | ICD-10-CM

## 2022-12-04 LAB — LIPID PANEL
Cholesterol: 111 mg/dL (ref 0–200)
HDL: 42 mg/dL (ref >39.00)
LDL Cholesterol: 30 mg/dL (ref 0–99)
NonHDL: 68.82
Total CHOL/HDL Ratio: 3
Triglycerides: 193 mg/dL — ABNORMAL HIGH (ref 0.0–149.0)
VLDL: 38.6 mg/dL (ref 0.0–40.0)

## 2022-12-04 LAB — COMPREHENSIVE METABOLIC PANEL
ALT: 38 U/L (ref 0–53)
AST: 42 U/L — ABNORMAL HIGH (ref 0–37)
Albumin: 4.5 g/dL (ref 3.5–5.2)
Alkaline Phosphatase: 56 U/L (ref 39–117)
BUN: 23 mg/dL (ref 6–23)
CO2: 30 mEq/L (ref 19–32)
Calcium: 9.2 mg/dL (ref 8.4–10.5)
Chloride: 98 mEq/L (ref 96–112)
Creatinine, Ser: 1.53 mg/dL — ABNORMAL HIGH (ref 0.40–1.50)
GFR: 46.67 mL/min — ABNORMAL LOW (ref >60.00)
Glucose, Bld: 212 mg/dL — ABNORMAL HIGH (ref 70–99)
Potassium: 4.3 mEq/L (ref 3.5–5.1)
Sodium: 139 mEq/L (ref 135–145)
Total Bilirubin: 0.5 mg/dL (ref 0.2–1.2)
Total Protein: 7.2 g/dL (ref 6.0–8.3)

## 2022-12-04 LAB — CBC
HCT: 46.2 % (ref 39.0–52.0)
Hemoglobin: 15.2 g/dL (ref 13.0–17.0)
MCHC: 32.9 g/dL (ref 30.0–36.0)
MCV: 95.9 fl (ref 78.0–100.0)
Platelets: 229 10*3/uL (ref 150.0–400.0)
RBC: 4.81 Mil/uL (ref 4.22–5.81)
RDW: 14.6 % (ref 11.5–15.5)
WBC: 6.3 10*3/uL (ref 4.0–10.5)

## 2022-12-04 LAB — MICROALBUMIN / CREATININE URINE RATIO
Creatinine,U: 19.4 mg/dL
Microalb Creat Ratio: 3.6 mg/g (ref 0.0–30.0)
Microalb, Ur: <0.7 mg/dL (ref 0.0–1.9)

## 2022-12-04 LAB — TSH: TSH: 1.34 u[IU]/mL (ref 0.35–5.50)

## 2022-12-04 LAB — HEMOGLOBIN A1C: Hgb A1c MFr Bld: 7.8 % — ABNORMAL HIGH (ref 4.6–6.5)

## 2022-12-04 NOTE — Assessment & Plan Note (Signed)
Check A1c. On tresiba 114 units daily and Jardiance 20 mg daily.  He can go to 25 mg daily on Jardiance once he runs out of current prescription.  We did briefly discuss GLP-1 agonist.  He would be a great candidate for this and we can start Mounjaro if A1c is not at goal.

## 2022-12-04 NOTE — Assessment & Plan Note (Signed)
Patient wishes for removal.  Will refer him back to see cardiology.  He believes that he had this placed many years ago by Dr. Daphine Deutscher at [facility].

## 2022-12-04 NOTE — Progress Notes (Signed)
   [name] is a 67 y.o. male who presents today for an office visit.  Assessment/Plan:  Chronic Problems Addressed Today: T2DM (type 2 diabetes mellitus) (HCC) Check A1c. On tresiba 114 units daily and Jardiance 20 mg daily.  He can go to 25 mg daily on Jardiance once he runs out of current prescription.  We did briefly discuss GLP-1 agonist.  He would be a great candidate for this and we can start Mounjaro if A1c is not at goal.  Hypertension associated with diabetes (HCC) Blood pressure at goal.  Continue management per cardiology with diltiazem 240 mg daily and lisinopril 20 mg daily.  Dyslipidemia associated with type 2 diabetes mellitus (HCC) Follows with cardiology.  Check labs today.  On Crestor 40 mg daily.  Actinic keratosis Cryotherapy applied today.  See below procedure note.  He tolerated well.  LAP-BAND surgery status Patient wishes for removal.  Will refer him back to see cardiology.  He believes that he had this placed many years ago by Dr. Daphine Deutscher at [facility]     Subjective:  HPI:  See Assessment / plan for status of chronic conditions. At our last visit his A1c was not controlled at 8.4 and we increased his jardiance to 25 mg daily. He doubled up on his home jardiance and has been taking 20 mg daily.  He has done well with this.       Objective:  Physical Exam: BP 98/65   Pulse 75   Temp (!) 97.3 F (36.3 C) (Temporal)   Ht 6' (1.829 m)   Wt 293 lb 6.4 oz (133.1 kg)   SpO2 96%   BMI 39.79 kg/m   Wt Readings from Last 3 Encounters:  12/04/22 293 lb 6.4 oz (133.1 kg)  10/16/22 297 lb 12.8 oz (135.1 kg)  10/08/22 294 lb 3.2 oz (133.4 kg)  Gen: No acute distress, resting comfortably CV: Regular rate and rhythm with no murmurs appreciated Pulm: Normal work of breathing, clear to auscultation bilaterally with no crackles, wheezes, or rhonchi Skin: Actinic keratosis on left fore arm Neuro: Grossly normal, moves all extremities Psych: Normal  affect and thought content  Cryotherapy Procedure Note  Pre-operative Diagnosis: Actinic keratosis  Locations: Right Forearm  Indications: Therapeutic  Procedure Details  Patient informed of risks (permanent scarring, infection, light or dark discoloration, bleeding, infection, weakness, numbness and recurrence of the lesion) and benefits of the procedure and verbal informed consent obtained.  The areas are treated with liquid nitrogen therapy, frozen until ice ball extended 3 mm beyond lesion, allowed to thaw, and treated again. The patient tolerated procedure well.  The patient was instructed on post-op care, warned that there may be blister formation, redness and pain. Recommend OTC analgesia as needed for pain.  Condition: Stable  Complications: none.       [name]. [name] Ralph, MD 12/04/2022 11:21 AM

## 2022-12-04 NOTE — Assessment & Plan Note (Signed)
Follows with cardiology.  Check labs today.  On Crestor 40 mg daily.

## 2022-12-04 NOTE — Assessment & Plan Note (Signed)
Cryotherapy applied today.  See below procedure note.  He tolerated well. 

## 2022-12-04 NOTE — Assessment & Plan Note (Signed)
Blood pressure at goal.  Continue management per cardiology with diltiazem 240 mg daily and lisinopril 20 mg daily.

## 2022-12-04 NOTE — Patient Instructions (Addendum)
It was very nice to see you today!  We froze the spot on your arm today.   No medication changes today.  Will refer you to the surgeon for your Lap-Band.  Return in about 3 months (around 03/06/2023) for Follow Up.   Take care, Dr [name]  PLEASE NOTE:  If you had any lab tests, please let us know if you have not heard back within a few days. You may see your results on mychart before we have a chance to review them but we will give you a call once they are reviewed by [place].   If we ordered any referrals today, please let us know if you have not heard from their office within the next week.   If you had any urgent prescriptions sent in today, please check with the pharmacy within an hour of our visit to make sure the prescription was transmitted appropriately.   Please try these tips to maintain a healthy lifestyle:  Eat at least 3 REAL meals and 1-2 snacks per day.  Aim for no more than 5 hours between eating.  If you eat breakfast, please do so within one hour of getting up.   Each meal should contain half fruits/vegetables, one quarter protein, and one quarter carbs (no bigger than a computer mouse)  Cut down on sweet beverages. This includes juice, soda, and sweet tea.   Drink at least 1 glass of water with each meal and aim for at least 8 glasses per day  Exercise at least 150 minutes every week.

## 2022-12-05 ENCOUNTER — Encounter: Payer: Self-pay | Admitting: Family Medicine

## 2022-12-05 NOTE — Telephone Encounter (Signed)
See note

## 2022-12-06 ENCOUNTER — Ambulatory Visit (INDEPENDENT_AMBULATORY_CARE_PROVIDER_SITE_OTHER): Payer: Medicare Other | Admitting: Podiatry

## 2022-12-06 ENCOUNTER — Ambulatory Visit (INDEPENDENT_AMBULATORY_CARE_PROVIDER_SITE_OTHER): Payer: Medicare Other

## 2022-12-06 DIAGNOSIS — E1149 Type 2 diabetes mellitus with other diabetic neurological complication: Secondary | ICD-10-CM

## 2022-12-06 DIAGNOSIS — L84 Corns and callosities: Secondary | ICD-10-CM

## 2022-12-06 NOTE — Telephone Encounter (Signed)
A1c actually looks better than last time.  Please see results notes.  He may be having some postprandial readings that are elevated but as long as his A1c is improving then we are okay to keep doing what we are doing.

## 2022-12-06 NOTE — Progress Notes (Signed)
His A1c is better than the last time that we checked.  He can go to 25 mg daily of Jardiance as we discussed at his office visit.  We can recheck again in 3 months.  We do not need to start one of the injectable medications at this point however if A1c does not continue to improve we can readdress this at next office visit.  The rest of his labs are all stable and we can recheck everything else in a year or so.

## 2022-12-08 NOTE — Progress Notes (Signed)
Subjective: Chief Complaint  Patient presents with   Diabetes    Diabetic ulcer of right great toe    67 year old male presents the office with above concerns.  He has been doing well he states he does not see any drainage or pus.  There is a pill debris has not been keeping a bandage on that because of this.  Still some minimal swelling on the right big toe but improved.  No redness or warmth.  No pain.  No fevers or chills.   Objective: AAO x3, NAD DP/PT pulses palpable bilaterally, CRT less than 3 seconds On the plantar aspects of bilateral hallux hyperkeratotic tissue.  They are preulcerative but upon debridement there is no underlying ulceration drainage or any signs of infection today.  Mild edema on the right hallux.  Left there is no erythema or warmth.  There is no fluctuation, crepitation, malodor. Chronic bilateral lower extremity edema present. No pain with calf compression, swelling, warmth, erythema  Assessment: Bilateral ulcerations with resolving cellulitis-much improved  Plan: -All treatment options discussed with the patient including all alternatives, risks, complications.  -X-ray obtained and reviewed of the right foot.  There is no cortical changes suggest osteomyelitis at this time.  There is some sclerotic changes present distal phalanx bilaterally of the hallux but no cortical destruction.  No soft tissue edema. -Debrided the calluses bilateral without any complications or bleeding.  The ulcerations have healed.  Continue offloading.  Moisturizer daily.  Still awaiting his custom sandals with offloading.  Symptoms back previously as they were not offloading is much as I would like. -Monitor for any clinical signs or symptoms of infection and directed to call the office immediately should any occur or go to the ER.  X-ray next appointment if there is an ulcer/opening in the skin.   Return in about 4 weeks (around 01/03/2023).  Vivi Barrack DPM

## 2022-12-12 ENCOUNTER — Telehealth: Payer: Self-pay | Admitting: Pulmonary Disease

## 2022-12-12 NOTE — Telephone Encounter (Signed)
Pt wants to know if his common cold will interfere with his breathing test

## 2022-12-13 ENCOUNTER — Encounter: Payer: Self-pay | Admitting: Pulmonary Disease

## 2022-12-13 ENCOUNTER — Ambulatory Visit: Payer: Medicare Other | Admitting: Pulmonary Disease

## 2022-12-13 NOTE — Telephone Encounter (Signed)
I spoke to pt and he called 11/2622 and sent MyChart message today. And he stated he was congested and not able to do his PFT or f/up with Dr Judeth Horn today due to him not feeling good. So pt wanted to resch both appts for PFT and f/up with Dr Judeth Horn. I went ahead and resch'd pt for 02/25/2023 for PFT and and [place] f/up. I also let pt know I will inform the Dr of reason for canceling. Pt verbalized understanding nothing further needed.

## 2022-12-13 NOTE — Telephone Encounter (Signed)
Spoke to pt told him per Dr. [name], A1c actually looks better than last time. Please see results notes. He may be having some postprandial readings that are elevated but as long as his A1c is improving then we are okay to keep doing what we are doing. Pt verbalized understanding.

## 2022-12-18 ENCOUNTER — Other Ambulatory Visit (HOSPITAL_COMMUNITY): Payer: Self-pay | Admitting: Surgery

## 2022-12-18 ENCOUNTER — Telehealth: Payer: Self-pay | Admitting: *Deleted

## 2022-12-18 DIAGNOSIS — Z9884 Bariatric surgery status: Secondary | ICD-10-CM

## 2022-12-18 DIAGNOSIS — K824 Cholesterolosis of gallbladder: Secondary | ICD-10-CM

## 2022-12-18 NOTE — Telephone Encounter (Signed)
   Name: [name]  DOB: 07-Nov-1955  MRN: [id number]  Primary Cardiologist: Christell Constant, MD   Preoperative team, please contact this patient and set up a phone call appointment for further preoperative risk assessment. Please obtain consent and complete medication review. Thank you for your help.  I confirm that guidance regarding antiplatelet and oral anticoagulation therapy has been completed and, if necessary, noted below.  Per protocol patient can hold aspirin 7 days prior to procedure.   Napoleon Form, Leodis Rains, NP 12/18/2022, 4:21 PM [facility]

## 2022-12-18 NOTE — Telephone Encounter (Addendum)
Spoke with patient who states that he will just keep his appointment with Dr. Izora Ribas on 9/16 due to his surgery being in Sept. Since he has to other testing. I will update appointment notes to reflect pre-op.

## 2022-12-18 NOTE — Telephone Encounter (Signed)
   Pre-operative Risk Assessment    Patient Name: [name]  DOB: 1955-09-26 MRN: [id number]      Request for Surgical Clearance    Procedure:   LAPAROSCOPIC REMOVAL OF LAP-BAND SURGERY   Date of Surgery:  Clearance TBD                                 Surgeon:  DR. Twana First Surgeon's Group or Practice Name:  [facility] Phone number:  [phone] Fax number:  [phone] Scarlett Presto, CMA   Type of Clearance Requested:   - Medical ; ASA   Type of Anesthesia:  General    Additional requests/questions:    Elpidio Anis   12/18/2022, 3:48 PM

## 2023-01-02 ENCOUNTER — Other Ambulatory Visit: Payer: Self-pay | Admitting: Family Medicine

## 2023-01-12 ENCOUNTER — Encounter: Payer: Self-pay | Admitting: Podiatry

## 2023-01-14 ENCOUNTER — Ambulatory Visit: Payer: Medicare Other | Admitting: Podiatry

## 2023-01-15 ENCOUNTER — Encounter: Payer: Self-pay | Admitting: Family Medicine

## 2023-01-15 NOTE — Telephone Encounter (Signed)
Please have him schedule appointment soon to discuss.  Needs to be seen ASAP if he is still having any of those symptoms.   [name]. [name], MD 01/15/2023 2:19 PM

## 2023-01-16 NOTE — Telephone Encounter (Signed)
Spoke with patient stated been taking 114 unit but now he is taking 120unit. Blood sugar this morning was 195

## 2023-01-16 NOTE — Telephone Encounter (Signed)
Can we clarify what dose of insulin he is on now?  [name]. [name], MD 01/16/2023 10:20 AM

## 2023-01-16 NOTE — Telephone Encounter (Signed)
Please adivse

## 2023-01-16 NOTE — Telephone Encounter (Signed)
Please schedule an OV with PCP  

## 2023-01-16 NOTE — Telephone Encounter (Signed)
Recommend he increase to 65 units twice daily and follow up with [place] soon.  [name]. [name], MD 01/16/2023 10:41 AM

## 2023-01-18 NOTE — Telephone Encounter (Signed)
Please advise 

## 2023-01-21 ENCOUNTER — Other Ambulatory Visit: Payer: Self-pay | Admitting: Podiatry

## 2023-01-21 MED ORDER — DOXYCYCLINE HYCLATE 100 MG PO TABS: 100.0000 mg | ORAL_TABLET | Freq: Two times a day (BID) | ORAL | 0 refills | Status: DC

## 2023-01-21 NOTE — Telephone Encounter (Signed)
Jardiance keeps his sugar lower so stopping it will cause his sugar to increase. To compensate we will have to increase his dose of insulin as needed.   Please have him schedule a follow up appointment with [place] soon.  [name]. [name], MD 01/21/2023 7:26 AM

## 2023-01-21 NOTE — Telephone Encounter (Signed)
Can we schedule him this week if possible?? If I am not available is there anyone else? Thanks!

## 2023-01-22 ENCOUNTER — Ambulatory Visit (INDEPENDENT_AMBULATORY_CARE_PROVIDER_SITE_OTHER): Payer: Medicare Other

## 2023-01-22 ENCOUNTER — Encounter: Payer: Self-pay | Admitting: Family Medicine

## 2023-01-22 ENCOUNTER — Ambulatory Visit (INDEPENDENT_AMBULATORY_CARE_PROVIDER_SITE_OTHER): Payer: Medicare Other | Admitting: Family Medicine

## 2023-01-22 VITALS — BP 110/58 | HR 77 | Temp 97.8°F | Wt 304.2 lb

## 2023-01-22 VITALS — BP 95/59 | HR 65 | Temp 97.0°F | Ht 72.0 in | Wt 304.6 lb

## 2023-01-22 DIAGNOSIS — E1165 Type 2 diabetes mellitus with hyperglycemia: Secondary | ICD-10-CM | POA: Diagnosis not present

## 2023-01-22 DIAGNOSIS — E1159 Type 2 diabetes mellitus with other circulatory complications: Secondary | ICD-10-CM

## 2023-01-22 DIAGNOSIS — I152 Hypertension secondary to endocrine disorders: Secondary | ICD-10-CM

## 2023-01-22 DIAGNOSIS — Z0000 Encounter for general adult medical examination without abnormal findings: Secondary | ICD-10-CM

## 2023-01-22 DIAGNOSIS — Z794 Long term (current) use of insulin: Secondary | ICD-10-CM

## 2023-01-22 MED ORDER — TRESIBA FLEXTOUCH 200 UNIT/ML ~~LOC~~ SOPN: 130.0000 [IU] | PEN_INJECTOR | Freq: Every day | SUBCUTANEOUS | 5 refills | Status: DC

## 2023-01-22 NOTE — Patient Instructions (Signed)
[name] , Thank you for taking time to come for your Medicare Wellness Visit. I appreciate your ongoing commitment to your health goals. Please review the following plan we discussed and let me know if I can assist you in the future.   Referrals/Orders/Follow-Ups/Clinician Recommendations: lose 100 lbs   This is a list of the screening recommended for you and due dates:  Health Maintenance  Topic Date Due   Zoster (Shingles) Vaccine (1 of 2) Never done   Eye exam for diabetics  07/02/2019   Flu Shot  01/17/2023   Pneumonia Vaccine (2 of 2 - PCV) 05/29/2023*   Complete foot exam   02/23/2023   Hemoglobin A1C  06/05/2023   Yearly kidney function blood test for diabetes  12/04/2023   Yearly kidney health urinalysis for diabetes  12/04/2023   Medicare Annual Wellness Visit  01/22/2024   DTaP/Tdap/Td vaccine (2 - Td or Tdap) 02/08/2025   Colon Cancer Screening  09/20/2027   Hepatitis C Screening  Completed   HPV Vaccine  Aged Out   COVID-19 Vaccine  Discontinued  *Topic was postponed. The date shown is not the original due date.    Advanced directives: (Copy Requested) Please bring a copy of your health care power of attorney and living will to the office to be added to your chart at your convenience.  Next Medicare Annual Wellness Visit scheduled for next year: Yes  Preventive Care 56 Years and Older, Male  Preventive care refers to lifestyle choices and visits with your health care provider that can promote health and wellness. What does preventive care include? A yearly physical exam. This is also called an annual well check. Dental exams once or twice a year. Routine eye exams. Ask your health care provider how often you should have your eyes checked. Personal lifestyle choices, including: Daily care of your teeth and gums. Regular physical activity. Eating a healthy diet. Avoiding tobacco and drug use. Limiting alcohol use. Practicing safe sex. Taking low doses of aspirin  every day. Taking vitamin and mineral supplements as recommended by your health care provider. What happens during an annual well check? The services and screenings done by your health care provider during your annual well check will depend on your age, overall health, lifestyle risk factors, and family history of disease. Counseling  Your health care provider may ask you questions about your: Alcohol use. Tobacco use. Drug use. Emotional well-being. Home and relationship well-being. Sexual activity. Eating habits. History of falls. Memory and ability to understand (cognition). Work and work Astronomer. Screening  You may have the following tests or measurements: Height, weight, and BMI. Blood pressure. Lipid and cholesterol levels. These may be checked every 5 years, or more frequently if you are over 12 years old. Skin check. Lung cancer screening. You may have this screening every year starting at age 68 if you have a 30-pack-year history of smoking and currently smoke or have quit within the past 15 years. Fecal occult blood test (FOBT) of the stool. You may have this test every year starting at age 68. Flexible sigmoidoscopy or colonoscopy. You may have a sigmoidoscopy every 5 years or a colonoscopy every 10 years starting at age 68. Prostate cancer screening. Recommendations will vary depending on your family history and other risks. Hepatitis C blood test. Hepatitis B blood test. Sexually transmitted disease (STD) testing. Diabetes screening. This is done by checking your blood sugar (glucose) after you have not eaten for a while (fasting). You may  have this done every 1-3 years. Abdominal aortic aneurysm (AAA) screening. You may need this if you are a current or former smoker. Osteoporosis. You may be screened starting at age 68 if you are at high risk. Talk with your health care provider about your test results, treatment options, and if necessary, the need for more  tests. Vaccines  Your health care provider may recommend certain vaccines, such as: Influenza vaccine. This is recommended every year. Tetanus, diphtheria, and acellular pertussis (Tdap, Td) vaccine. You may need a Td booster every 10 years. Zoster vaccine. You may need this after age 68. Pneumococcal 13-valent conjugate (PCV13) vaccine. One dose is recommended after age 68. Pneumococcal polysaccharide (PPSV23) vaccine. One dose is recommended after age 68. Talk to your health care provider about which screenings and vaccines you need and how often you need them. This information is not intended to replace advice given to you by your health care provider. Make sure you discuss any questions you have with your health care provider. Document Released: 07/01/2015 Document Revised: 02/22/2016 Document Reviewed: 04/05/2015 Elsevier Interactive Patient Education  2017 [employer].  Fall Prevention in the Home Falls can cause injuries. They can happen to people of all ages. There are many things you can do to make your home safe and to help prevent falls. What can I do on the outside of my home? Regularly fix the edges of walkways and driveways and fix any cracks. Remove anything that might make you trip as you walk through a door, such as a raised step or threshold. Trim any bushes or trees on the path to your home. Use bright outdoor lighting. Clear any walking paths of anything that might make someone trip, such as rocks or tools. Regularly check to see if handrails are loose or broken. Make sure that both sides of any steps have handrails. Any raised decks and porches should have guardrails on the edges. Have any leaves, snow, or ice cleared regularly. Use sand or salt on walking paths during winter. Clean up any spills in your garage right away. This includes oil or grease spills. What can I do in the bathroom? Use night lights. Install grab bars by the toilet and in the tub and shower.  Do not use towel bars as grab bars. Use non-skid mats or decals in the tub or shower. If you need to sit down in the shower, use a plastic, non-slip stool. Keep the floor dry. Clean up any water that spills on the floor as soon as it happens. Remove soap buildup in the tub or shower regularly. Attach bath mats securely with double-sided non-slip rug tape. Do not have throw rugs and other things on the floor that can make you trip. What can I do in the bedroom? Use night lights. Make sure that you have a light by your bed that is easy to reach. Do not use any sheets or blankets that are too big for your bed. They should not hang down onto the floor. Have a firm chair that has side arms. You can use this for support while you get dressed. Do not have throw rugs and other things on the floor that can make you trip. What can I do in the kitchen? Clean up any spills right away. Avoid walking on wet floors. Keep items that you use a lot in easy-to-reach places. If you need to reach something above you, use a strong step stool that has a grab bar. Keep electrical cords  out of the way. Do not use floor polish or wax that makes floors slippery. If you must use wax, use non-skid floor wax. Do not have throw rugs and other things on the floor that can make you trip. What can I do with my stairs? Do not leave any items on the stairs. Make sure that there are handrails on both sides of the stairs and use them. Fix handrails that are broken or loose. Make sure that handrails are as long as the stairways. Check any carpeting to make sure that it is firmly attached to the stairs. Fix any carpet that is loose or worn. Avoid having throw rugs at the top or bottom of the stairs. If you do have throw rugs, attach them to the floor with carpet tape. Make sure that you have a light switch at the top of the stairs and the bottom of the stairs. If you do not have them, ask someone to add them for you. What else  can I do to help prevent falls? Wear shoes that: Do not have high heels. Have rubber bottoms. Are comfortable and fit you well. Are closed at the toe. Do not wear sandals. If you use a stepladder: Make sure that it is fully opened. Do not climb a closed stepladder. Make sure that both sides of the stepladder are locked into place. Ask someone to hold it for you, if possible. Clearly mark and make sure that you can see: Any grab bars or handrails. First and last steps. Where the edge of each step is. Use tools that help you move around (mobility aids) if they are needed. These include: Canes. Walkers. Scooters. Crutches. Turn on the lights when you go into a dark area. Replace any light bulbs as soon as they burn out. Set up your furniture so you have a clear path. Avoid moving your furniture around. If any of your floors are uneven, fix them. If there are any pets around you, be aware of where they are. Review your medicines with your doctor. Some medicines can make you feel dizzy. This can increase your chance of falling. Ask your doctor what other things that you can do to help prevent falls. This information is not intended to replace advice given to you by your health care provider. Make sure you discuss any questions you have with your health care provider. Document Released: 03/31/2009 Document Revised: 11/10/2015 Document Reviewed: 07/09/2014 Elsevier Interactive Patient Education  2017 [employer].

## 2023-01-22 NOTE — Assessment & Plan Note (Addendum)
Had lengthy discussion with patient and his wife today regarding his treatment plan.  He had significant side effects with Jardiance.  We will discontinue this.  He would like to pursue treatment with insulin alone at this point.  He is currently on 130 units daily.  Will continue this for now and he can titrate as needed over the next several days with a goal of getting his fasting sugars into the lower 100s.  He is comfortable with titrating at home.  We did discuss importance of all increasing by a few units each day to prevent any hypoglycemic episodes.  He will follow-up with me in a week or 2 via MyChart and we can adjust as needed.  Recheck A1c in 2 months.  We did discuss GLP agonist again.  Stated he had been on this in the past and may had some pleasant side effects of these.  May consider low-dose of Mounjaro in the future if A1c is not at goal.  We also did discuss referral to endocrinology.  Will hold off on this for now however if sugars are not at goal with above would consider referral.

## 2023-01-22 NOTE — Progress Notes (Signed)
   [name] is a 67 y.o. male who presents today for an office visit.  Assessment/Plan:  Chronic Problems Addressed Today: T2DM (type 2 diabetes mellitus) (HCC) Had lengthy discussion with patient and his wife today regarding his treatment plan.  He had significant side effects with Jardiance.  We will discontinue this.  He would like to pursue treatment with insulin alone at this point.  He is currently on 130 units daily.  Will continue this for now and he can titrate as needed over the next several days with a goal of getting his fasting sugars into the lower 100s.  He is comfortable with titrating at home.  We did discuss importance of all increasing by a few units each day to prevent any hypoglycemic episodes.  He will follow-up with me in a week or 2 via MyChart and we can adjust as needed.  Recheck A1c in 2 months.  We did discuss GLP agonist again.  Stated he had been on this in the past and may had some pleasant side effects of these.  May consider low-dose of Mounjaro in the future if A1c is not at goal.  We also did discuss referral to endocrinology.  Will hold off on this for now however if sugars are not at goal with above would consider referral.  Hypertension associated with diabetes (HCC) Blood pressure at goal today on diltiazem 180 mg daily and lisinopril 20 mg daily.     Subjective:  HPI:  See A/P for status of chronic conditions.  Patient is here today for follow-up.  Last time a couple months ago.  At our last visit A1c was 7.8.  Will increase Jardiance to 25 mg daily and continued him on tresiba 114 units daily.  Unfortunately had to stop the Jardiance due to multiple side effects including dizziness, dehydration, and lightheadedness.  After stopping the Jardiance his sugar increased and he is subsequently increased his dose of [place].  Is now on Tresiba 130 units daily.  His sugar is slowly responding.       Objective:  Physical Exam: BP (!) 95/59   Pulse 65    Temp (!) 97 F (36.1 C) (Temporal)   Ht 6' (1.829 m)   Wt (!) 304 lb 9.6 oz (138.2 kg)   SpO2 96%   BMI 41.31 kg/m   Wt Readings from Last 3 Encounters:  01/22/23 (!) 304 lb 9.6 oz (138.2 kg)  01/22/23 (!) 304 lb 3.2 oz (138 kg)  12/04/22 293 lb 6.4 oz (133.1 kg)    Gen: No acute distress, resting comfortably Neuro: Grossly normal, moves all extremities Psych: Normal affect and thought content       M. [name], MD 01/22/2023 3:25 PM

## 2023-01-22 NOTE — Patient Instructions (Signed)
It was very nice to see you today!  It is ok for you to stay off of your Jardiance.  You can increase your dose of tresiba.  We should try to get your morning sugars back in the low 100s.  Please send me a message in a few weeks to let me know how you are doing.  Will see back in a couple months to recheck your A1c.  Return in about 2 months (around 03/24/2023).   Take care, Dr [name]  PLEASE NOTE:  If you had any lab tests, please let us know if you have not heard back within a few days. You may see your results on mychart before we have a chance to review them but we will give you a call once they are reviewed by [place].   If we ordered any referrals today, please let us know if you have not heard from their office within the next week.   If you had any urgent prescriptions sent in today, please check with the pharmacy within an hour of our visit to make sure the prescription was transmitted appropriately.   Please try these tips to maintain a healthy lifestyle:  Eat at least 3 REAL meals and 1-2 snacks per day.  Aim for no more than 5 hours between eating.  If you eat breakfast, please do so within one hour of getting up.   Each meal should contain half fruits/vegetables, one quarter protein, and one quarter carbs (no bigger than a computer mouse)  Cut down on sweet beverages. This includes juice, soda, and sweet tea.   Drink at least 1 glass of water with each meal and aim for at least 8 glasses per day  Exercise at least 150 minutes every week.

## 2023-01-22 NOTE — Progress Notes (Signed)
Subjective:   [name] is a 67 y.o. male who presents for Medicare Annual/Subsequent preventive examination.  Visit Complete: In person  Review of Systems     Cardiac Risk Factors include: advanced age (>68men, >25 women);hypertension;dyslipidemia;male gender;obesity (BMI >30kg/m2)     Objective:    Today's Vitals   01/22/23 1348  BP: (!) 110/58  Pulse: 77  Temp: 97.8 F (36.6 C)  SpO2: 92%  Weight: (!) 304 lb 3.2 oz (138 kg)   Body mass index is 41.26 kg/m.     01/22/2023    2:03 PM 09/20/2022    1:36 PM 07/19/2021   11:00 AM 07/19/2021   10:00 AM 07/18/2021   11:58 AM 04/25/2021   10:11 AM 11/17/2016   12:10 PM  Advanced Directives  Does Patient Have a Medical Advance Directive? Yes No Yes  Yes Yes Yes  Type of Estate agent of [place];Living will  Healthcare Power of [place];Living will Healthcare Power of [place];Living will Healthcare Power of [place];Living will  Living will;Healthcare Power of Attorney  Does patient want to make changes to medical advance directive?   No - Patient declined      Copy of Healthcare Power of Attorney in Chart? No - copy requested  No - copy requested No - copy requested   No - copy requested    Current Medications (verified) Outpatient Encounter Medications as of 01/22/2023  Medication Sig   acetaminophen (TYLENOL) 500 MG tablet Take 500 mg by mouth every 6 (six) hours as needed (for pain.).   aspirin EC 81 MG tablet Take 81 mg by mouth daily. Swallow whole.   Budeson-Glycopyrrol-Formoterol (BREZTRI AEROSPHERE) 160-9-4.8 MCG/ACT AERO Inhale 2 puffs into the lungs in the morning and at bedtime.   Cholecalciferol (VITAMIN D) 50 MCG (2000 UT) tablet Take 2,000 Units by mouth daily.   diltiazem (CARDIZEM CD) 180 MG 24 hr capsule Take 1 capsule (180 mg total) by mouth daily.   diphenhydrAMINE (BENADRYL) 25 MG tablet Take 25 mg by mouth every 6 (six) hours as needed for itching or allergies.   doxycycline  (VIBRA-TABS) 100 MG tablet Take 1 tablet (100 mg total) by mouth 2 (two) times daily.   ezetimibe (ZETIA) 10 MG tablet Take 1 tablet (10 mg total) by mouth daily.   fluticasone (FLONASE) 50 MCG/ACT nasal spray Place 1 spray into both nostrils daily as needed for allergies.   glucose blood (TRUETEST TEST) test strip Use as instructed   ketoconazole (NIZORAL) 2 % cream Apply 1 Application topically daily as needed for irritation.   lisinopril (ZESTRIL) 20 MG tablet TAKE ONE TABLET BY MOUTH ONE TIME DAILY   loratadine (CLARITIN) 10 MG tablet Take 10 mg by mouth daily.   Multiple Vitamin (MULTIVITAMIN WITH MINERALS) TABS tablet Take 1 tablet by mouth in the morning.   potassium chloride SA (KLOR-CON M) 20 MEQ tablet Take 60 mEq by mouth daily.   rosuvastatin (CRESTOR) 40 MG tablet TAKE ONE TABLET BY MOUTH ONE TIME DAILY   silver sulfADIAZINE (SSD) 1 % cream Apply 1 Application topically daily.   tamsulosin (FLOMAX) 0.4 MG CAPS capsule TAKE ONE CAPSULE BY MOUTH ONCE A DAY THIRTY MINUTES AFTER THE SAME MEAL EACH DAY   torsemide (DEMADEX) 20 MG tablet Take 3 tablets (60 mg total) by mouth daily.   TRESIBA FLEXTOUCH 200 UNIT/ML FlexTouch Pen INJECT 56 UNITS INTO THE SKIN EVERY MORNING AND INJECT 56 UNITS INTO THE SKIN EVERY EVENING (Patient taking differently: 114 Units daily.  Taking 114 units in morning only)   doxycycline (VIBRA-TABS) 100 MG tablet Take 1 tablet (100 mg total) by mouth 2 (two) times daily.   [DISCONTINUED] empagliflozin (JARDIANCE) 25 MG TABS tablet Take 1 tablet (25 mg total) by mouth daily before breakfast.   [DISCONTINUED] torsemide (DEMADEX) 20 MG tablet Take 1 tablet (20 mg total) by mouth daily as needed.   No facility-administered encounter medications on file as of 01/22/2023.    Allergies (verified) Penicillins   History: Past Medical History:  Diagnosis Date   Abdominal aortic aneurysm (AAA), 30-34 mm diameter (HCC) 06/01/2017   Nml w/ greatest dm 3 cm US Aorta 06/2016,  Abd [place] 05/01/2017 showed infrarenal dilatation at 3.4 cm - rec repeat US in 3 yrs.   Allergy    "pollen"   Arthritis    Cataract    removed,bilateral   Cervical disc disease 12/26/2011   Coronary artery disease involving native coronary artery of native heart without angina pectoris 12/01/2016   He had coronary angiography in 2011. There was irregularity within the LAD with up to 30% narrowing. The myocardial perfusion imaging done 11/07/2015 did not demonstrate any evidence of ischemia with a low risk nuclear stress test other than EF estimated at 47%. Chest CT 01/25/2016 showed diffuse coronary artery calcifications with heavy calcifications in the LAD. Pt seen by cardiology Dr. Mendel Ryder    Diabetes mellitus    Difficult intubation 04/05/2014   Dyslipidemia    Family history of anesthesia complication    pt states took days for his father to awaken after mask was used    Fatty liver 06/01/2017   [place] 05/01/2017   GERD (gastroesophageal reflux disease)    has been having acid reflux since recent endoscopy    H/O hiatal hernia    repair with lap band, now has ventral hernia midline abd   Hearing loss-aides 03/26/2012   Hyperlipidemia    Hypertension    Lapband APS + hiatal hernia repair Oct 2015 04/05/2014   Morbid obesity (HCC)    OSA (obstructive sleep apnea)    Rotator cuff tear    Seasonal allergies    Shortness of breath    pt states related to high BP meds with extended walking or climbling stairs   Sleep apnea    Past Surgical History:  Procedure Laterality Date   BIOPSY  09/20/2022   Procedure: BIOPSY;  Surgeon: Lemar Lofty., MD;  Location: [facility];  Service: Gastroenterology;;   CARDIAC CATHETERIZATION     approx 3 years ago    COLONOSCOPY WITH PROPOFOL N/A 03/10/2015   Procedure: COLONOSCOPY WITH PROPOFOL;  Surgeon: Rachael Fee, MD;  Location: [facility];  Service: Endoscopy;  Laterality: N/A;   COLONOSCOPY WITH PROPOFOL N/A 09/20/2022   Procedure:  COLONOSCOPY WITH PROPOFOL;  Surgeon: Meridee Score Netty Starring., MD;  Location: [facility];  Service: Gastroenterology;  Laterality: N/A;   colonscopy      2012   CORONARY ATHERECTOMY N/A 07/18/2021   Procedure: CORONARY ATHERECTOMY;  Surgeon: Corky Crafts, MD;  Location: [facility] INVASIVE CV LAB;  Service: Cardiovascular;  Laterality: N/A;   CORONARY STENT INTERVENTION N/A 07/18/2021   Procedure: CORONARY STENT INTERVENTION;  Surgeon: Corky Crafts, MD;  Location: [facility] INVASIVE CV LAB;  Service: Cardiovascular;  Laterality: N/A;   ESOPHAGOGASTRODUODENOSCOPY (EGD) WITH PROPOFOL N/A 03/10/2015   Procedure: ESOPHAGOGASTRODUODENOSCOPY (EGD) WITH PROPOFOL;  Surgeon: Rachael Fee, MD;  Location: [facility];  Service: Endoscopy;  Laterality: N/A;   HERNIA REPAIR  with gastric banding   LAPAROSCOPIC GASTRIC BANDING N/A 04/05/2014   Procedure: LAPAROSCOPIC GASTRIC BANDING;  Surgeon: Valarie Merino, MD;  Location: [facility];  Service: General;  Laterality: N/A;   MENISCUS REPAIR Left    left knee torn meniscus   RIGHT/LEFT HEART CATH AND CORONARY ANGIOGRAPHY N/A 07/18/2021   Procedure: RIGHT/LEFT HEART CATH AND CORONARY ANGIOGRAPHY;  Surgeon: Corky Crafts, MD;  Location[facility] INVASIVE CV LAB;  Service: Cardiovascular;  Laterality: N/A;   VASECTOMY     Family History  Problem Relation Age of Onset   Clotting disorder Mother        lung clot   Mesothelioma Father    Hypertension Brother    Skin cancer Brother    Colon cancer Neg Hx    Colon polyps Neg Hx    Crohn's disease Neg Hx    Esophageal cancer Neg Hx    Rectal cancer Neg Hx    Stomach cancer Neg Hx    Ulcerative colitis Neg Hx    Social History   Socioeconomic History   Marital status: Married    Spouse name: Not on file   Number of children: 2   Years of education: 16   Highest education level: Bachelor's degree (e.g., BA, AB, BS)  Occupational History   Occupation: owner    Comment: Games developer    Occupation: Retired  Tobacco Use   Smoking status: Former    Current packs/day: 0.00    Average packs/day: 4.0 packs/day for 20.0 years (80.0 ttl pk-yrs)    Types: Cigarettes    Start date: 07/19/1973    Quit date: 12/13/1991    Years since quitting: 31.1   Smokeless tobacco: Never  Vaping Use   Vaping status: Never Used  Substance and Sexual Activity   Alcohol use: Yes    Alcohol/week: 3.0 - 5.0 standard drinks of alcohol    Types: 3 - 5 Glasses of wine per week    Comment: one glass of wine nightly   Drug use: No   Sexual activity: Not on file  Other Topics Concern   Not on file  Social History Narrative   Not on file   Social Determinants of Health   Financial Resource Strain: Low Risk  (01/22/2023)   Overall Financial Resource Strain (CARDIA)    Difficulty of Paying Living Expenses: Not hard at all  Food Insecurity: No Food Insecurity (01/22/2023)   Hunger Vital Sign    Worried About Running Out of Food in the Last Year: Never true    Ran Out of Food in the Last Year: Never true  Transportation Needs: No Transportation Needs (01/22/2023)   PRAPARE - Administrator, Civil Service (Medical): No    Lack of Transportation (Non-Medical): No  Physical Activity: Inactive (01/22/2023)   Exercise Vital Sign    Days of Exercise per Week: 0 days    Minutes of Exercise per Session: 0 min  Stress: No Stress Concern Present (01/22/2023)   [employer] of Occupational Health - Occupational Stress Questionnaire    Feeling of Stress : Not at all  Social Connections: Moderately Integrated (01/22/2023)   Social Connection and Isolation Panel [NHANES]    Frequency of Communication with Friends and Family: More than three times a week    Frequency of Social Gatherings with Friends and Family: Twice a week    Attends Religious Services: 1 to 4 times per year    Active Member of Clubs or Organizations: No  Attends Banker Meetings: Never    Marital Status: Married     Tobacco Counseling Counseling given: Not Answered   Clinical Intake:  Pre-visit preparation completed: Yes  Pain : No/denies pain     BMI - recorded: 41.26 Nutritional Status: BMI > 30  Obese Nutritional Risks: None Diabetes: Yes CBG done?: Yes (130 per pt) CBG resulted in Enter/ Edit results?: No Did pt. bring in CBG monitor from home?: No  How often do you need to have someone help you when you read instructions, pamphlets, or other written materials from your doctor or pharmacy?: 1 - Never  Interpreter Needed?: No  Information entered by :: Lanier Ensign, LPN   Activities of Daily Living    01/22/2023    1:55 PM  In your present state of health, do you have any difficulty performing the following activities:  Hearing? 1  Comment hearing aid / HOH  Vision? 0  Difficulty concentrating or making decisions? 0  Walking or climbing stairs? 1  Comment SOB  Dressing or bathing? 1  Comment need assistance  Doing errands, shopping? 0  Preparing Food and eating ? Y  Comment wife assist  Using the Toilet? N  In the past six months, have you accidently leaked urine? N  Do you have problems with loss of bowel control? N  Managing your Medications? N  Managing your Finances? N  Housekeeping or managing your Housekeeping? N    Patient Care Team: Ardith Dark, MD as PCP - General (Family Medicine) Christell Constant, MD as PCP - Cardiology (Cardiology) Lyn Records, MD (Inactive) as Consulting Physician (Cardiology) Rachael Fee, MD as Attending Physician (Gastroenterology) Kalman Shan, MD as Consulting Physician (Pulmonary Disease) Cherlyn Roberts, MD as Consulting Physician (Dermatology) Sidney Ace, MD as Referring Physician (Allergy) Arita Miss, MD as Attending Physician (Nephrology) Keturah Barre, MD as Consulting Physician (Otolaryngology) Tobias Alexander, OD as Referring Physician (Optometry) Carlisle Cater, MD as  Referring Physician (Internal Medicine)  Indicate any recent Medical Services you may have received from other than [facility] providers in the past year (date may be approximate).     Assessment:   This is a routine wellness examination for [facility].  Hearing/Vision screen Hearing Screening - Comments:: Pt has hearing aids  Vision Screening - Comments:: Pt follows up with Dr Earlie Server for annual eye exams   Dietary issues and exercise activities discussed:     Goals Addressed             This Visit's Progress    Patient Stated       Loss 100 lbs        Depression Screen    01/22/2023    2:03 PM 12/04/2022   10:45 AM 09/03/2022   10:11 AM 05/28/2022    9:14 AM 02/26/2022    9:12 AM 11/21/2021    1:45 PM 11/10/2021    9:51 AM  PHQ 2/9 Scores  PHQ - 2 Score 0 0 0 0 0 0 3  PHQ- 9 Score       10    Fall Risk    01/22/2023    2:09 PM 12/04/2022   10:45 AM 09/03/2022   10:12 AM 05/28/2022    9:14 AM 02/26/2022    9:12 AM  Fall Risk   Falls in the past year? 0 0 0 0 0  Number falls in past yr: 0 0 0 0 0  Injury with Fall? 0 0 0  0 0  Risk for fall due to : Impaired vision;Impaired balance/gait No Fall Risks No Fall Risks No Fall Risks No Fall Risks  Follow up Falls prevention discussed        MEDICARE RISK AT HOME:  Medicare Risk at Home - 01/22/23 1409     Any stairs in or around the home? Yes    If so, are there any without handrails? No    Home free of loose throw rugs in walkways, pet beds, electrical cords, etc? Yes    Adequate lighting in your home to reduce risk of falls? Yes    Life alert? No    Use of a cane, walker or w/c? Yes    Grab bars in the bathroom? No    Shower chair or bench in shower? Yes    Elevated toilet seat or a handicapped toilet? No             TIMED UP AND GO:  Was the test performed?  No    Cognitive Function:        Immunizations Immunization History  Administered Date(s) Administered   Influenza, High Dose Seasonal PF  04/30/2018   Influenza,inj,Quad PF,6+ Mos 06/30/2016, 03/14/2017   Moderna Sars-Covid-2 Vaccination 10/14/2020   PFIZER(Purple Top)SARS-COV-2 Vaccination 09/10/2019, 10/05/2019   Pneumococcal Polysaccharide-23 10/09/2012   Tdap 02/09/2015    TDAP status: Up to date  Flu Vaccine status: Due, Education has been provided regarding the importance of this vaccine. Advised may receive this vaccine at local pharmacy or Health Dept. Aware to provide a copy of the vaccination record if obtained from local pharmacy or Health Dept. Verbalized acceptance and understanding.  Pneumococcal vaccine status: Due, Education has been provided regarding the importance of this vaccine. Advised may receive this vaccine at local pharmacy or Health Dept. Aware to provide a copy of the vaccination record if obtained from local pharmacy or Health Dept. Verbalized acceptance and understanding.  Covid-19 vaccine status: Completed vaccines  Qualifies for Shingles Vaccine? Yes   Zostavax completed No   Shingrix Completed?: No.    Education has been provided regarding the importance of this vaccine. Patient has been advised to call insurance company to determine out of pocket expense if they have not yet received this vaccine. Advised may also receive vaccine at local pharmacy or Health Dept. Verbalized acceptance and understanding.  Screening Tests Health Maintenance  Topic Date Due   Zoster Vaccines- Shingrix (1 of 2) Never done   OPHTHALMOLOGY EXAM  07/02/2019   INFLUENZA VACCINE  01/17/2023   Pneumonia Vaccine 59+ Years old (2 of 2 - PCV) 05/29/2023 (Originally 10/09/2013)   FOOT EXAM  02/23/2023   HEMOGLOBIN A1C  06/05/2023   Diabetic kidney evaluation - eGFR measurement  12/04/2023   Diabetic kidney evaluation - Urine ACR  12/04/2023   Medicare Annual Wellness (AWV)  01/22/2024   DTaP/Tdap/Td (2 - Td or Tdap) 02/08/2025   Colonoscopy  09/20/2027   Hepatitis C Screening  Completed   HPV VACCINES  Aged Out    COVID-19 Vaccine  Discontinued    Health Maintenance  Health Maintenance Due  Topic Date Due   Zoster Vaccines- Shingrix (1 of 2) Never done   OPHTHALMOLOGY EXAM  07/02/2019   INFLUENZA VACCINE  01/17/2023    Colorectal cancer screening: Type of screening: Colonoscopy. Completed 09/20/22. Repeat every 5 years Additional Screening:  Hepatitis C Screening:  Completed 01/13/14  Vision Screening: Recommended annual ophthalmology exams for early detection of glaucoma and other disorders  of the eye. Is the patient up to date with their annual eye exam?  Yes  Who is the provider or what is the name of the office in which the patient attends annual eye exams? Dr Kennyth Arnold palmer  If pt is not established with a provider, would they like to be referred to a provider to establish care? No .   Dental Screening: Recommended annual dental exams for proper oral hygiene  Diabetic Foot Exam: Diabetic Foot Exam: Completed 02/22/22  Community Resource Referral / Chronic Care Management: CRR required this visit?  No   CCM required this visit?  No     Plan:     I have personally reviewed and noted the following in the patient's chart:   Medical and social history Use of alcohol, tobacco or illicit drugs  Current medications and supplements including opioid prescriptions. Patient is not currently taking opioid prescriptions. Functional ability and status Nutritional status Physical activity Advanced directives List of other physicians Hospitalizations, surgeries, and ER visits in previous 12 months Vitals Screenings to include cognitive, depression, and falls Referrals and appointments  In addition, I have reviewed and discussed with patient certain preventive protocols, quality metrics, and best practice recommendations. A written personalized care plan for preventive services as well as general preventive health recommendations were provided to patient.     Marzella Schlein, LPN   0/06/270    After Visit Summary: (MyChart) Due to this being a telephonic visit, the after visit summary with patients personalized plan was offered to patient via MyChart   Nurse Notes: none

## 2023-01-22 NOTE — Assessment & Plan Note (Signed)
Blood pressure at goal today on diltiazem 180 mg daily and lisinopril 20 mg daily.

## 2023-01-24 ENCOUNTER — Ambulatory Visit (INDEPENDENT_AMBULATORY_CARE_PROVIDER_SITE_OTHER): Payer: Medicare Other | Admitting: Podiatry

## 2023-01-24 DIAGNOSIS — Z7901 Long term (current) use of anticoagulants: Secondary | ICD-10-CM

## 2023-01-24 DIAGNOSIS — E1149 Type 2 diabetes mellitus with other diabetic neurological complication: Secondary | ICD-10-CM

## 2023-01-24 DIAGNOSIS — L97512 Non-pressure chronic ulcer of other part of right foot with fat layer exposed: Secondary | ICD-10-CM

## 2023-01-24 NOTE — Progress Notes (Signed)
Subjective:  Patient ID: [name], male    DOB: [birth year],  MRN: [id number]  Chief Complaint  Patient presents with   Wound Check    Ulcer to right hallux. He has been seen by Dr. Ardelle Anton in the past for he same problem. Patient is diabetic. Latest A1C 7.8.       67 y.o. male presents with concern for ulceration to the right great toe.  He says it started as a blister and has noticed some bleeding from the area.  He has been seeing Dr. Loreta Ave in the past for this wound.  Does have some neuropathy.  History of diabetes.  Past Medical History:  Diagnosis Date   Abdominal aortic aneurysm (AAA), 30-34 mm diameter (HCC) 06/01/2017   Nml w/ greatest dm 3 cm US Aorta 06/2016, Abd [place] 05/01/2017 showed infrarenal dilatation at 3.4 cm - rec repeat US in 3 yrs.   Allergy    "pollen"   Arthritis    Cataract    removed,bilateral   Cervical disc disease 12/26/2011   Coronary artery disease involving native coronary artery of native heart without angina pectoris 12/01/2016   He had coronary angiography in 2011. There was irregularity within the LAD with up to 30% narrowing. The myocardial perfusion imaging done 11/07/2015 did not demonstrate any evidence of ischemia with a low risk nuclear stress test other than EF estimated at 47%. Chest CT 01/25/2016 showed diffuse coronary artery calcifications with heavy calcifications in the LAD. Pt seen by cardiology Dr. Mendel Ryder    Diabetes mellitus    Difficult intubation 04/05/2014   Dyslipidemia    Family history of anesthesia complication    pt states took days for his father to awaken after mask was used    Fatty liver 06/01/2017   [place] 05/01/2017   GERD (gastroesophageal reflux disease)    has been having acid reflux since recent endoscopy    H/O hiatal hernia    repair with lap band, now has ventral hernia midline abd   Hearing loss-aides 03/26/2012   Hyperlipidemia    Hypertension    Lapband APS + hiatal hernia repair Oct 2015 04/05/2014    Morbid obesity (HCC)    OSA (obstructive sleep apnea)    Rotator cuff tear    Seasonal allergies    Shortness of breath    pt states related to high BP meds with extended walking or climbling stairs   Sleep apnea     Allergies  Allergen Reactions   Jardiance [Empagliflozin]     dizziness   Penicillins Nausea Only    Upset stomach    ROS: Negative except as per HPI above  Objective:  General: AAO x3, NAD  Dermatological: Ulcer at the plantar medial aspect of the right hallux.  Upon debridement of overlying hyperkeratotic tissue there is underlying ulceration that probes to subcutaneous fat level.  Measured approximately 1 cm x 1 cm x 0.2 cm postdebridement.  There is mild malodor odor associated with the wound but minimal erythema or edema of the right great toe at this time       Vascular:  Dorsalis Pedis artery and Posterior Tibial artery pedal pulses are 2/4 bilateral.  Capillary fill time < 3 sec to all digits.   Neruologic: Grossly absent via light touch to the right great toe protective sensations absent  Musculoskeletal: No gross boney pedal deformities bilateral. No pain, crepitus, or limitation noted with foot and ankle range of motion bilateral. Muscular strength 5/5 in  all groups tested bilateral.  Gait: Unassisted, Nonantalgic.   No images are attached to the encounter.  Radiographs:  Deferred Assessment:   1. Right foot ulcer, with fat layer exposed (HCC)   2. Chronic anticoagulation   3. Type II diabetes mellitus with neurological manifestations [facility])      Plan:  Patient was evaluated and treated and all questions answered.  Ulcer plantar medial aspect of the right hallux with mild soft tissue infection to a lot of subcutaneous fat tissue -We discussed the etiology and factors that are a part of the wound healing process.  We also discussed the risk of infection both soft tissue and osteomyelitis from open ulceration.  Discussed the risk of limb loss  if this happens or worsens. -Debridement as below. -Dressed with Betadine, DSD. -Continue home dressing changes daily with mupirocin ointment and dry gauze -Continue off-loading with felt padding. -Vascular testing deferred patient has pedal pulses palpable and bleeds well with debridement -HgbA1c: 7.9 -Last antibiotics: Patient has doxycycline 100 mg twice daily for 7 days already from ABX that were called in -Imaging: Deferred  Procedure: Excisional Debridement of Wound Rationale: Removal of non-viable soft tissue from the wound to promote healing.  Anesthesia: none Post-Debridement Wound Measurements: 1 cm x 1 cm x 0.2 cm  Type of Debridement: Sharp Excisional Tissue Removed: Non-viable soft tissue Depth of Debridement: subcutaneous tissue. Technique: Sharp excisional debridement to bleeding, viable wound base.  Dressing: Dry, sterile, compression dressing. Disposition: Patient tolerated procedure well.     Return in about 3 weeks (around 02/14/2023) for f/u R hallux ulcer.          Corinna Gab, DPM [facility] / [facility]

## 2023-01-24 NOTE — Progress Notes (Signed)
Subjective:   [name] is a 67 y.o. male who presents for Medicare Annual/Subsequent preventive examination.  Visit Complete: In person  Review of Systems     Cardiac Risk Factors include: advanced age (>20men, >13 women);hypertension;dyslipidemia;male gender;obesity (BMI >30kg/m2)     Objective:    Today's Vitals   01/22/23 1348  BP: (!) 110/58  Pulse: 77  Temp: 97.8 F (36.6 C)  SpO2: 92%  Weight: (!) 304 lb 3.2 oz (138 kg)   Body mass index is 41.26 kg/m.     01/22/2023    2:03 PM 09/20/2022    1:36 PM 07/19/2021   11:00 AM 07/19/2021   10:00 AM 07/18/2021   11:58 AM 04/25/2021   10:11 AM 11/17/2016   12:10 PM  Advanced Directives  Does Patient Have a Medical Advance Directive? Yes No Yes  Yes Yes Yes  Type of Estate agent of [place];Living will  Healthcare Power of [place];Living will Healthcare Power of [place];Living will Healthcare Power of [place];Living will  Living will;Healthcare Power of Attorney  Does patient want to make changes to medical advance directive?   No - Patient declined      Copy of Healthcare Power of Attorney in Chart? No - copy requested  No - copy requested No - copy requested   No - copy requested    Current Medications (verified) Outpatient Encounter Medications as of 01/22/2023  Medication Sig   acetaminophen (TYLENOL) 500 MG tablet Take 500 mg by mouth every 6 (six) hours as needed (for pain.).   aspirin EC 81 MG tablet Take 81 mg by mouth daily. Swallow whole.   Budeson-Glycopyrrol-Formoterol (BREZTRI AEROSPHERE) 160-9-4.8 MCG/ACT AERO Inhale 2 puffs into the lungs in the morning and at bedtime.   Cholecalciferol (VITAMIN D) 50 MCG (2000 UT) tablet Take 2,000 Units by mouth daily.   diltiazem (CARDIZEM CD) 180 MG 24 hr capsule Take 1 capsule (180 mg total) by mouth daily.   diphenhydrAMINE (BENADRYL) 25 MG tablet Take 25 mg by mouth every 6 (six) hours as needed for itching or allergies.   doxycycline  (VIBRA-TABS) 100 MG tablet Take 1 tablet (100 mg total) by mouth 2 (two) times daily.   ezetimibe (ZETIA) 10 MG tablet Take 1 tablet (10 mg total) by mouth daily.   fluticasone (FLONASE) 50 MCG/ACT nasal spray Place 1 spray into both nostrils daily as needed for allergies.   glucose blood (TRUETEST TEST) test strip Use as instructed   ketoconazole (NIZORAL) 2 % cream Apply 1 Application topically daily as needed for irritation.   lisinopril (ZESTRIL) 20 MG tablet TAKE ONE TABLET BY MOUTH ONE TIME DAILY   loratadine (CLARITIN) 10 MG tablet Take 10 mg by mouth daily.   potassium chloride SA (KLOR-CON M) 20 MEQ tablet Take 60 mEq by mouth daily.   rosuvastatin (CRESTOR) 40 MG tablet TAKE ONE TABLET BY MOUTH ONE TIME DAILY   silver sulfADIAZINE (SSD) 1 % cream Apply 1 Application topically daily.   tamsulosin (FLOMAX) 0.4 MG CAPS capsule TAKE ONE CAPSULE BY MOUTH ONCE A DAY THIRTY MINUTES AFTER THE SAME MEAL EACH DAY   torsemide (DEMADEX) 20 MG tablet Take 3 tablets (60 mg total) by mouth daily.   [DISCONTINUED] Multiple Vitamin (MULTIVITAMIN WITH MINERALS) TABS tablet Take 1 tablet by mouth in the morning.   [DISCONTINUED] TRESIBA FLEXTOUCH 200 UNIT/ML FlexTouch Pen INJECT 56 UNITS INTO THE SKIN EVERY MORNING AND INJECT 56 UNITS INTO THE SKIN EVERY EVENING (Patient taking differently: 114  Units daily. Taking 114 units in morning only)   [DISCONTINUED] doxycycline (VIBRA-TABS) 100 MG tablet Take 1 tablet (100 mg total) by mouth 2 (two) times daily.   [DISCONTINUED] empagliflozin (JARDIANCE) 25 MG TABS tablet Take 1 tablet (25 mg total) by mouth daily before breakfast.   [DISCONTINUED] torsemide (DEMADEX) 20 MG tablet Take 1 tablet (20 mg total) by mouth daily as needed.   No facility-administered encounter medications on file as of 01/22/2023.    Allergies (verified) Jardiance [empagliflozin] and Penicillins   History: Past Medical History:  Diagnosis Date   Abdominal aortic aneurysm (AAA),  30-34 mm diameter (HCC) 06/01/2017   Nml w/ greatest dm 3 cm US Aorta 06/2016, Abd [place] 05/01/2017 showed infrarenal dilatation at 3.4 cm - rec repeat US in 3 yrs.   Allergy    "pollen"   Arthritis    Cataract    removed,bilateral   Cervical disc disease 12/26/2011   Coronary artery disease involving native coronary artery of native heart without angina pectoris 12/01/2016   He had coronary angiography in 2011. There was irregularity within the LAD with up to 30% narrowing. The myocardial perfusion imaging done 11/07/2015 did not demonstrate any evidence of ischemia with a low risk nuclear stress test other than EF estimated at 47%. Chest CT 01/25/2016 showed diffuse coronary artery calcifications with heavy calcifications in the LAD. Pt seen by cardiology Dr. Mendel Ryder    Diabetes mellitus    Difficult intubation 04/05/2014   Dyslipidemia    Family history of anesthesia complication    pt states took days for his father to awaken after mask was used    Fatty liver 06/01/2017   [place] 05/01/2017   GERD (gastroesophageal reflux disease)    has been having acid reflux since recent endoscopy    H/O hiatal hernia    repair with lap band, now has ventral hernia midline abd   Hearing loss-aides 03/26/2012   Hyperlipidemia    Hypertension    Lapband APS + hiatal hernia repair Oct 2015 04/05/2014   Morbid obesity (HCC)    OSA (obstructive sleep apnea)    Rotator cuff tear    Seasonal allergies    Shortness of breath    pt states related to high BP meds with extended walking or climbling stairs   Sleep apnea    Past Surgical History:  Procedure Laterality Date   BIOPSY  09/20/2022   Procedure: BIOPSY;  Surgeon: Lemar Lofty., MD;  Location: [facility];  Service: Gastroenterology;;   CARDIAC CATHETERIZATION     approx 3 years ago    COLONOSCOPY WITH PROPOFOL N/A 03/10/2015   Procedure: COLONOSCOPY WITH PROPOFOL;  Surgeon: Rachael Fee, MD;  Location: [facility];  Service:  Endoscopy;  Laterality: N/A;   COLONOSCOPY WITH PROPOFOL N/A 09/20/2022   Procedure: COLONOSCOPY WITH PROPOFOL;  Surgeon: Meridee Score Netty Starring., MD;  Location: [facility];  Service: Gastroenterology;  Laterality: N/A;   colonscopy      2012   CORONARY ATHERECTOMY N/A 07/18/2021   Procedure: CORONARY ATHERECTOMY;  Surgeon: Corky Crafts, MD;  Location: [facility] INVASIVE CV LAB;  Service: Cardiovascular;  Laterality: N/A;   CORONARY STENT INTERVENTION N/A 07/18/2021   Procedure: CORONARY STENT INTERVENTION;  Surgeon: Corky Crafts, MD;  [facility]: [facility] INVASIVE CV LAB;  Service: Cardiovascular;  Laterality: N/A;   ESOPHAGOGASTRODUODENOSCOPY (EGD) WITH PROPOFOL N/A 03/10/2015   Procedure: ESOPHAGOGASTRODUODENOSCOPY (EGD) WITH PROPOFOL;  Surgeon: Rachael Fee, MD;  Location: [facility];  Service: Endoscopy;  Laterality: N/A;   HERNIA REPAIR     with gastric banding   LAPAROSCOPIC GASTRIC BANDING N/A 04/05/2014   Procedure: LAPAROSCOPIC GASTRIC BANDING;  Surgeon: Valarie Merino, MD;  Location: [facility];  Service: General;  Laterality: N/A;   MENISCUS REPAIR Left    left knee torn meniscus   RIGHT/LEFT HEART CATH AND CORONARY ANGIOGRAPHY N/A 07/18/2021   Procedure: RIGHT/LEFT HEART CATH AND CORONARY ANGIOGRAPHY;  Surgeon: Corky Crafts, MD;  Location: [facility] INVASIVE CV LAB;  Service: Cardiovascular;  Laterality: N/A;   VASECTOMY     Family History  Problem Relation Age of Onset   Clotting disorder Mother        lung clot   Mesothelioma Father    Hypertension Brother    Skin cancer Brother    Colon cancer Neg Hx    Colon polyps Neg Hx    Crohn's disease Neg Hx    Esophageal cancer Neg Hx    Rectal cancer Neg Hx    Stomach cancer Neg Hx    Ulcerative colitis Neg Hx    Social History   Socioeconomic History   Marital status: Married    Spouse name: Not on file   Number of children: 2   Years of education: 16   Highest education level: Bachelor's degree (e.g., BA, AB, BS)   Occupational History   Occupation: owner    Comment: Games developer   Occupation: Retired  Tobacco Use   Smoking status: Former    Current packs/day: 0.00    Average packs/day: 4.0 packs/day for 20.0 years (80.0 ttl pk-yrs)    Types: Cigarettes    Start date: 07/19/1973    Quit date: 12/13/1991    Years since quitting: 31.1   Smokeless tobacco: Never  Vaping Use   Vaping status: Never Used  Substance and Sexual Activity   Alcohol use: Yes    Alcohol/week: 3.0 - 5.0 standard drinks of alcohol    Types: 3 - 5 Glasses of wine per week    Comment: one glass of wine nightly   Drug use: No   Sexual activity: Not on file  Other Topics Concern   Not on file  Social History Narrative   Not on file   Social Determinants of Health   Financial Resource Strain: Low Risk  (01/22/2023)   Overall Financial Resource Strain (CARDIA)    Difficulty of Paying Living Expenses: Not hard at all  Food Insecurity: No Food Insecurity (01/22/2023)   Hunger Vital Sign    Worried About Running Out of Food in the Last Year: Never true    Ran Out of Food in the Last Year: Never true  Transportation Needs: No Transportation Needs (01/22/2023)   PRAPARE - Administrator, Civil Service (Medical): No    Lack of Transportation (Non-Medical): No  Physical Activity: Inactive (01/22/2023)   Exercise Vital Sign    Days of Exercise per Week: 0 days    Minutes of Exercise per Session: 0 min  Stress: No Stress Concern Present (01/22/2023)   [employer] of Occupational Health - Occupational Stress Questionnaire    Feeling of Stress : Not at all  Social Connections: Moderately Integrated (01/22/2023)   Social Connection and Isolation Panel [NHANES]    Frequency of Communication with Friends and Family: More than three times a week    Frequency of Social Gatherings with Friends and Family: Twice a week    Attends Religious Services: 1 to 4 times per year  Active Member of Clubs or Organizations: No     Attends Banker Meetings: Never    Marital Status: Married    Tobacco Counseling Counseling given: Not Answered   Clinical Intake:  Pre-visit preparation completed: Yes  Pain : No/denies pain     BMI - recorded: 41.26 Nutritional Status: BMI > 30  Obese Nutritional Risks: None Diabetes: Yes CBG done?: Yes (130 per pt) CBG resulted in Enter/ Edit results?: No Did pt. bring in CBG monitor from home?: No  How often do you need to have someone help you when you read instructions, pamphlets, or other written materials from your doctor or pharmacy?: 1 - Never  Interpreter Needed?: No  Information entered by :: Lanier Ensign, LPN   Activities of Daily Living    01/22/2023    1:55 PM  In your present state of health, do you have any difficulty performing the following activities:  Hearing? 1  Comment hearing aid / HOH  Vision? 0  Difficulty concentrating or making decisions? 0  Walking or climbing stairs? 1  Comment SOB  Dressing or bathing? 1  Comment need assistance  Doing errands, shopping? 0  Preparing Food and eating ? Y  Comment wife assist  Using the Toilet? N  In the past six months, have you accidently leaked urine? N  Do you have problems with loss of bowel control? N  Managing your Medications? N  Managing your Finances? N  Housekeeping or managing your Housekeeping? N    Patient Care Team: Ardith Dark, MD as PCP - General (Family Medicine) Christell Constant, MD as PCP - Cardiology (Cardiology) Lyn Records, MD (Inactive) as Consulting Physician (Cardiology) Rachael Fee, MD as Attending Physician (Gastroenterology) Kalman Shan, MD as Consulting Physician (Pulmonary Disease) Cherlyn Roberts, MD as Consulting Physician (Dermatology) Sidney Ace, MD as Referring Physician (Allergy) Arita Miss, MD as Attending Physician (Nephrology) Keturah Barre, MD as Consulting Physician (Otolaryngology) Tobias Alexander,  OD as Referring Physician (Optometry) Carlisle Cater, MD as Referring Physician (Internal Medicine)  Indicate any recent Medical Services you may have received from other than [facility] providers in the past year (date may be approximate).     Assessment:   This is a routine wellness examination for [facility].  Hearing/Vision screen Hearing Screening - Comments:: Pt has hearing aids  Vision Screening - Comments:: Pt follows up with Dr Earlie Server for annual eye exams   Dietary issues and exercise activities discussed:     Goals Addressed             This Visit's Progress    Patient Stated       Loss 100 lbs        Depression Screen    01/22/2023    2:03 PM 12/04/2022   10:45 AM 09/03/2022   10:11 AM 05/28/2022    9:14 AM 02/26/2022    9:12 AM 11/21/2021    1:45 PM 11/10/2021    9:51 AM  PHQ 2/9 Scores  PHQ - 2 Score 0 0 0 0 0 0 3  PHQ- 9 Score       10    Fall Risk    01/22/2023    2:09 PM 12/04/2022   10:45 AM 09/03/2022   10:12 AM 05/28/2022    9:14 AM 02/26/2022    9:12 AM  Fall Risk   Falls in the past year? 0 0 0 0 0  Number falls in past yr: 0 0  0 0 0  Injury with Fall? 0 0 0 0 0  Risk for fall due to : Impaired vision;Impaired balance/gait No Fall Risks No Fall Risks No Fall Risks No Fall Risks  Follow up Falls prevention discussed        MEDICARE RISK AT HOME:    TIMED UP AND GO:  Was the test performed?  No    Cognitive Function:        01/24/2023    8:54 AM  6CIT Screen  What Year? 0 points  What month? 0 points  What time? 0 points  Count back from 20 0 points  Months in reverse 0 points  Repeat phrase 0 points  Total Score 0 points    Immunizations Immunization History  Administered Date(s) Administered   Influenza, High Dose Seasonal PF 04/30/2018   Influenza,inj,Quad PF,6+ Mos 06/30/2016, 03/14/2017   Moderna Sars-Covid-2 Vaccination 10/14/2020   PFIZER(Purple Top)SARS-COV-2 Vaccination 09/10/2019, 10/05/2019   Pneumococcal  Polysaccharide-23 10/09/2012   Tdap 02/09/2015    TDAP status: Up to date  Flu Vaccine status: Due, Education has been provided regarding the importance of this vaccine. Advised may receive this vaccine at local pharmacy or Health Dept. Aware to provide a copy of the vaccination record if obtained from local pharmacy or Health Dept. Verbalized acceptance and understanding.  Pneumococcal vaccine status: Due, Education has been provided regarding the importance of this vaccine. Advised may receive this vaccine at local pharmacy or Health Dept. Aware to provide a copy of the vaccination record if obtained from local pharmacy or Health Dept. Verbalized acceptance and understanding.  Covid-19 vaccine status: Completed vaccines  Qualifies for Shingles Vaccine? Yes   Zostavax completed No   Shingrix Completed?: No.    Education has been provided regarding the importance of this vaccine. Patient has been advised to call insurance company to determine out of pocket expense if they have not yet received this vaccine. Advised may also receive vaccine at local pharmacy or Health Dept. Verbalized acceptance and understanding.  Screening Tests Health Maintenance  Topic Date Due   OPHTHALMOLOGY EXAM  07/02/2019   Zoster Vaccines- Shingrix (1 of 2) 04/24/2023 (Originally 03/04/1975)   Pneumonia Vaccine 19+ Years old (2 of 2 - PCV) 05/29/2023 (Originally 10/09/2013)   INFLUENZA VACCINE  09/16/2023 (Originally 01/17/2023)   FOOT EXAM  02/23/2023   HEMOGLOBIN A1C  06/05/2023   Diabetic kidney evaluation - eGFR measurement  12/04/2023   Diabetic kidney evaluation - Urine ACR  12/04/2023   Medicare Annual Wellness (AWV)  01/22/2024   DTaP/Tdap/Td (2 - Td or Tdap) 02/08/2025   Colonoscopy  09/20/2027   Hepatitis C Screening  Completed   HPV VACCINES  Aged Out   COVID-19 Vaccine  Discontinued    Health Maintenance  Health Maintenance Due  Topic Date Due   OPHTHALMOLOGY EXAM  07/02/2019    Colorectal  cancer screening: Type of screening: Colonoscopy. Completed 09/20/22. Repeat every 5 years Additional Screening:  Hepatitis C Screening:  Completed 01/13/14  Vision Screening: Recommended annual ophthalmology exams for early detection of glaucoma and other disorders of the eye. Is the patient up to date with their annual eye exam?  Yes  Who is the provider or what is the name of the office in which the patient attends annual eye exams? Dr Kennyth Arnold palmer  If pt is not established with a provider, would they like to be referred to a provider to establish care? No .   Dental Screening: Recommended annual dental exams  for proper oral hygiene  Diabetic Foot Exam: Diabetic Foot Exam: Completed 02/22/22  Community Resource Referral / Chronic Care Management: CRR required this visit?  No   CCM required this visit?  No     Plan:     I have personally reviewed and noted the following in the patient's chart:   Medical and social history Use of alcohol, tobacco or illicit drugs  Current medications and supplements including opioid prescriptions. Patient is not currently taking opioid prescriptions. Functional ability and status Nutritional status Physical activity Advanced directives List of other physicians Hospitalizations, surgeries, and ER visits in previous 12 months Vitals Screenings to include cognitive, depression, and falls Referrals and appointments  In addition, I have reviewed and discussed with patient certain preventive protocols, quality metrics, and best practice recommendations. A written personalized care plan for preventive services as well as general preventive health recommendations were provided to patient.     Marzella Schlein, LPN   [id number]   After Visit Summary: (MyChart) Due to this being a telephonic visit, the after visit summary with patients personalized plan was offered to patient via MyChart   Nurse Notes: none

## 2023-01-25 ENCOUNTER — Other Ambulatory Visit: Payer: Self-pay | Admitting: *Deleted

## 2023-01-25 ENCOUNTER — Encounter: Payer: Self-pay | Admitting: Podiatry

## 2023-01-25 MED ORDER — TRESIBA FLEXTOUCH 200 UNIT/ML ~~LOC~~ SOPN: PEN_INJECTOR | SUBCUTANEOUS | 1 refills | Status: DC

## 2023-01-25 NOTE — Telephone Encounter (Signed)
Rx Tresiba 65 units twice daily send to [facility]

## 2023-01-26 IMAGING — MR MR FOOT*R* W/O CM
4 of 5 series · 24 of 40 positions shown · non-contrast
Comparison: None.

CLINICAL DATA: Right foot pain.

EXAM:
MRI OF THE RIGHT FOREFOOT WITHOUT CONTRAST
TECHNIQUE: Multiplanar, multisequence MR imaging of the right foot was
performed. No intravenous contrast was administered.

[Series 3: T1 · coronal · 3.0mm · 0.27mm/px · 5 of 44 slices shown (1 of 2)]
[im 1/44]
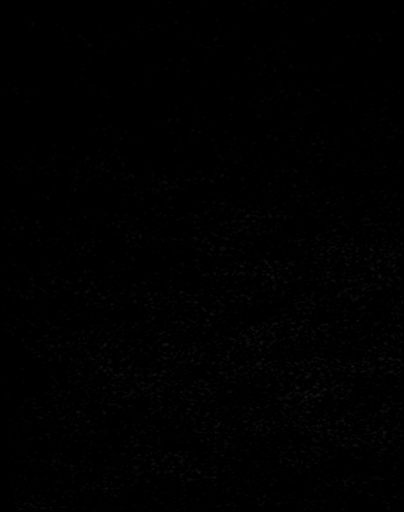
[im 5/44]
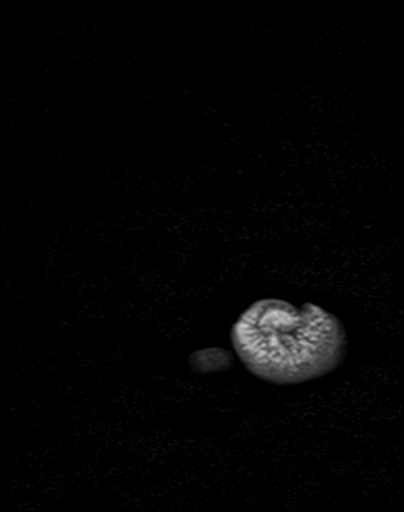
[im 9/44]
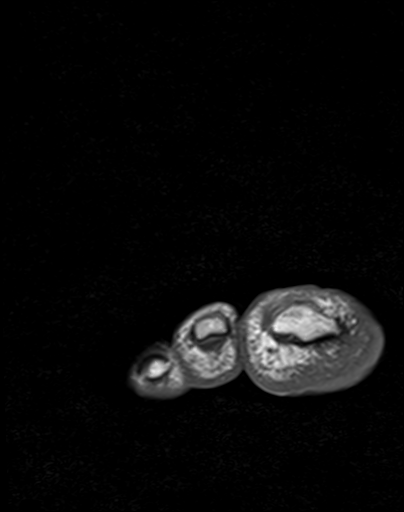
[im 22/44]
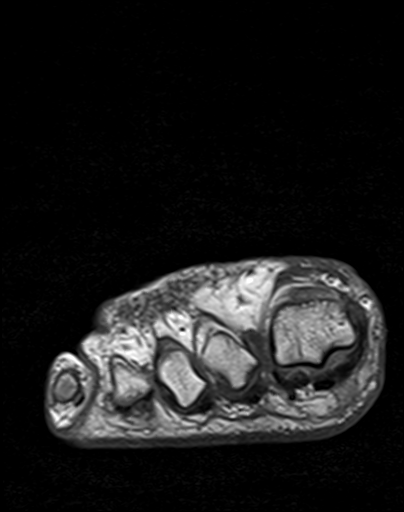
[im 39/44]
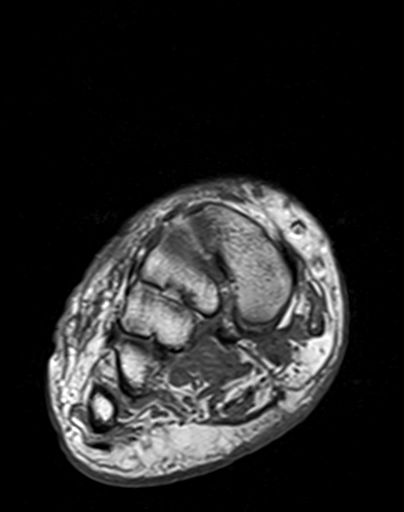

[Series 4: T2 fat-sat · coronal · 3.0mm · 0.27mm/px · 11 of 44 slices shown (1 of 2)]
[im 1/44]
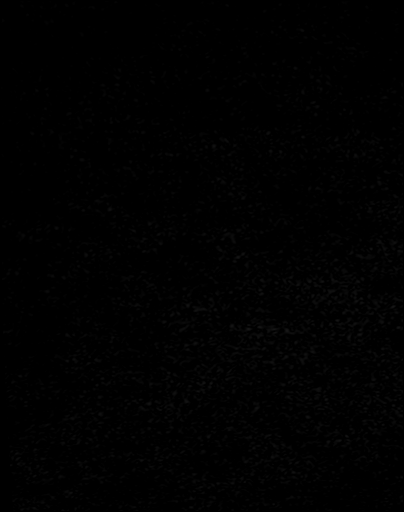
[im 5/44]
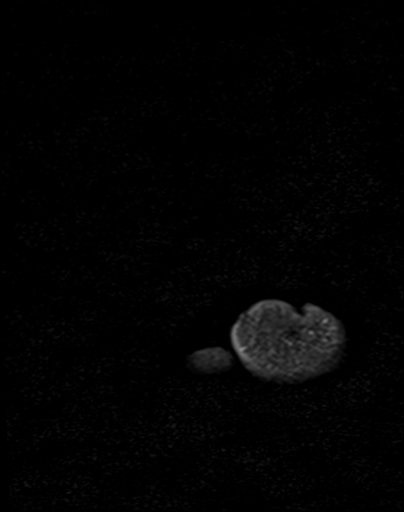
[im 9/44]
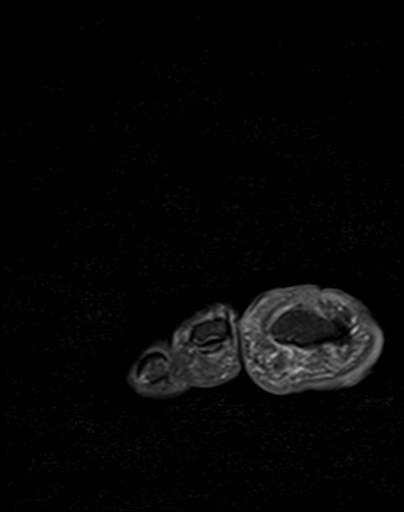
[im 13/44]
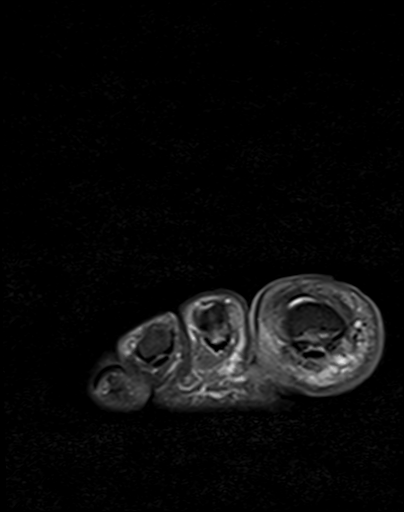
[im 18/44]
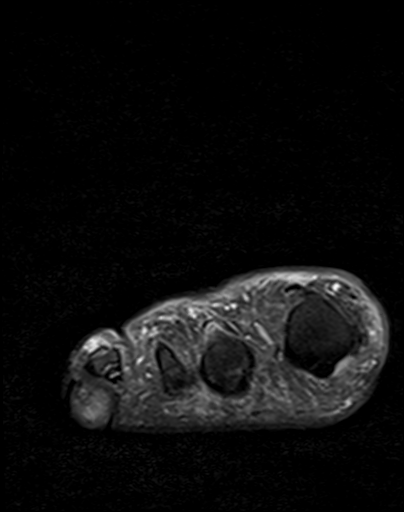
[im 22/44]
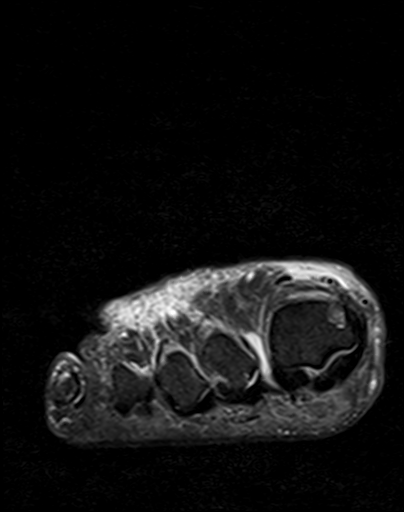
[im 26/44]
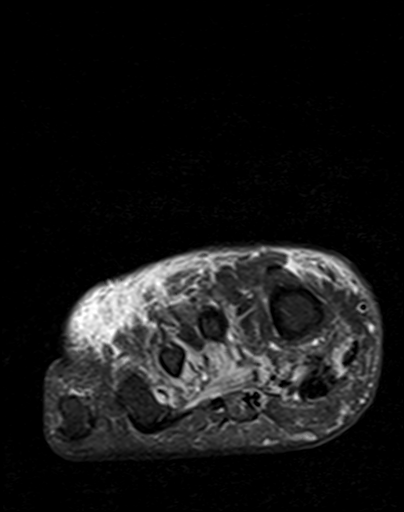
[im 31/44]
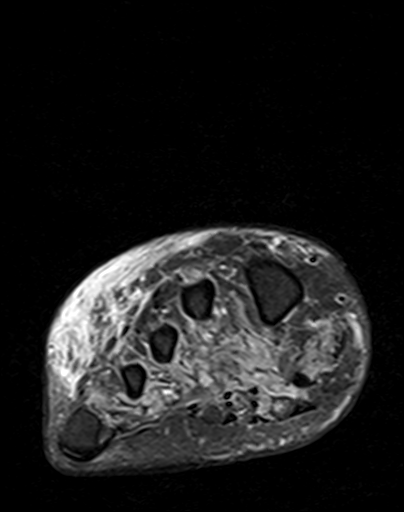
[im 35/44]
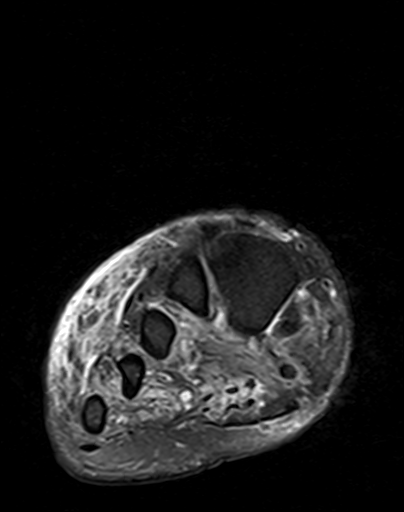
[im 39/44]
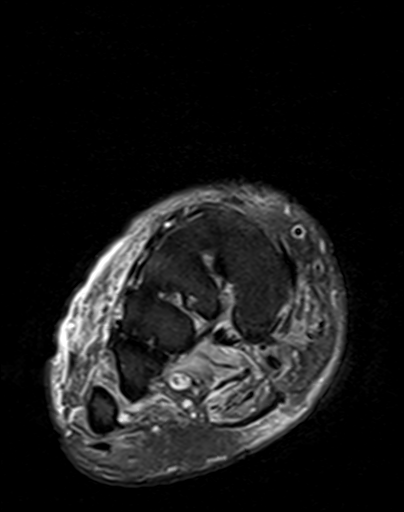
[im 44/44]
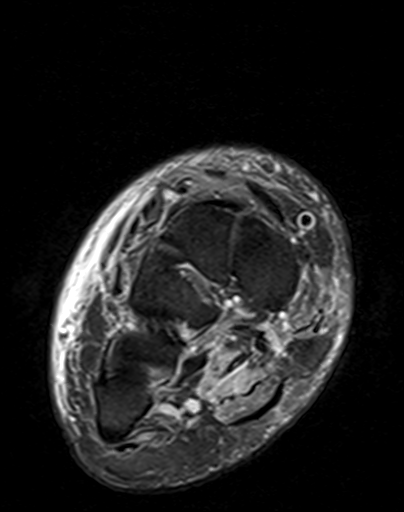

[Series 5: T2 fat-sat · axial · 3.0mm · 0.35mm/px · z∈[-81,-4]mm · 5 of 22 slices shown (2 of 2)]
[im 1/22]
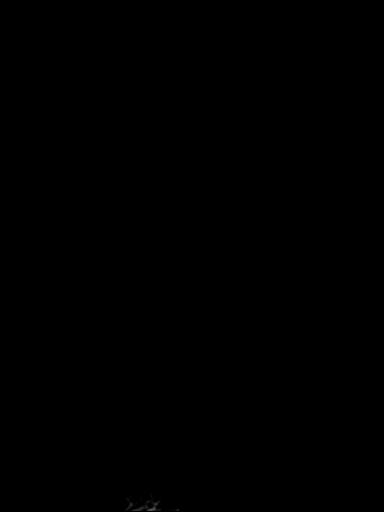
[im 6/22]
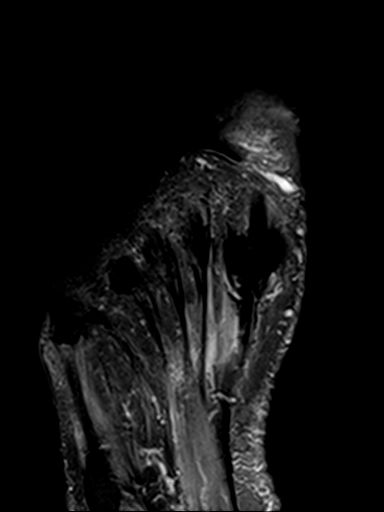
[im 11/22]
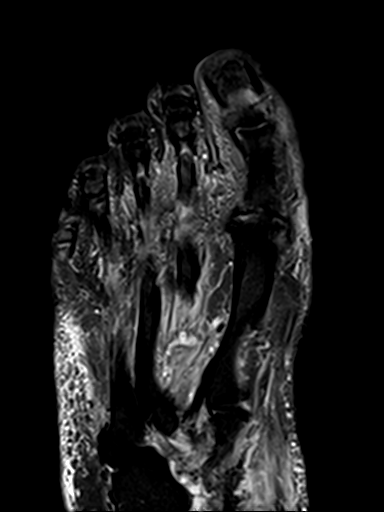
[im 16/22]
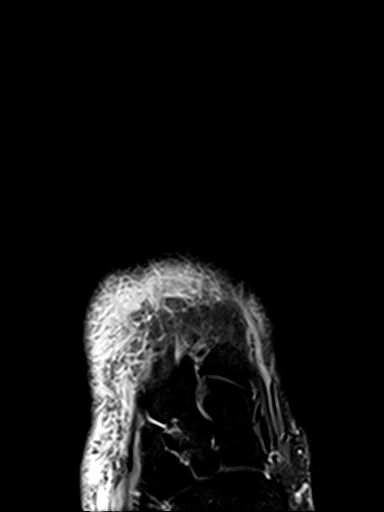
[im 22/22]
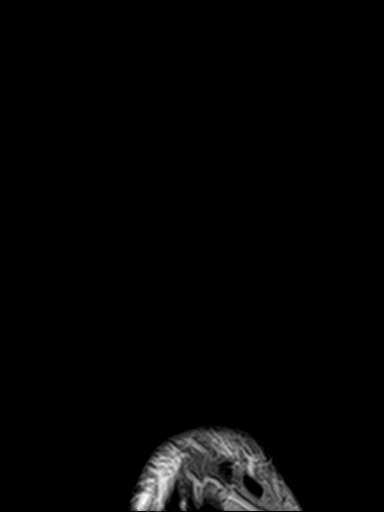

[Series 6: T1 · axial · 3.0mm · 0.35mm/px · z∈[-81,-4]mm · 3 of 22 slices shown (2 of 2)]
[im 1/22]
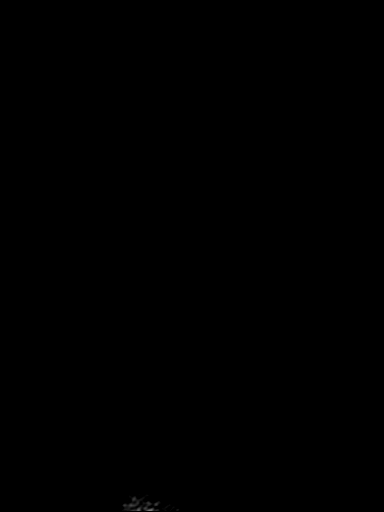
[im 11/22]
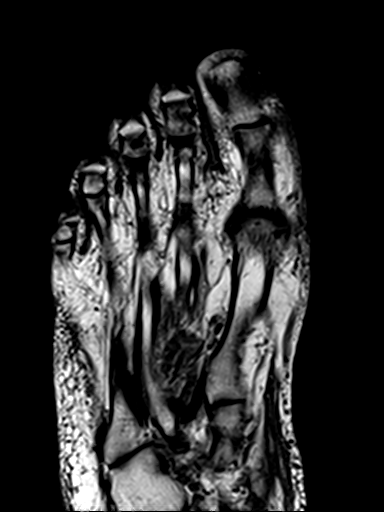
[im 22/22]
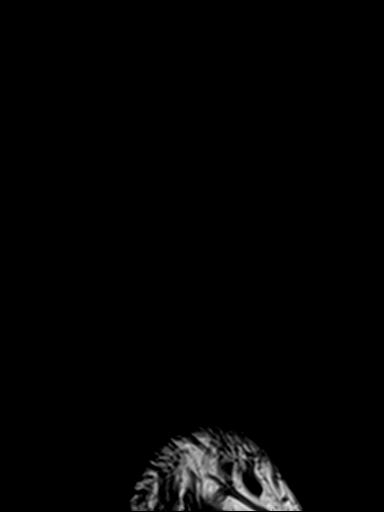

[24 of 40 positions shown; findings below may reference images not displayed]

FINDINGS: Bones/Joint/Cartilage

No fracture or dislocation. Normal alignment. No joint effusion. No
periosteal reaction or bone destruction. Moderate osteoarthritis of
the first MTP joint. Small amount of fluid in the first
intermetatarsal bursa.

Ligaments

Collateral ligaments are intact. Lisfranc ligament is intact.

Muscles and Tendons

Flexor, peroneal and extensor compartment tendons are intact. Mild
atrophy of the plantar musculature. T2 hyperintense signal within
the plantar musculature likely neurogenic.

Soft tissue
No fluid collection or hematoma. No soft tissue mass. Soft tissue
edema in the subcutaneous fat along the dorsal aspect of the foot.
Soft tissue edema and skin thickening of the great toe more focally
along the plantar medial aspect.
IMPRESSION: 1. No osteomyelitis of the right forefoot. No acute osseous injury
of the right foot.
2. Soft tissue edema and skin thickening of the great toe more
focally along the plantar medial aspect and along the dorsal aspect
of the foot which may reflect cellulitis versus reactive edema. No
drainable fluid collection to suggest an abscess.
3. Moderate osteoarthritis of the first MTP joint.

## 2023-01-28 ENCOUNTER — Ambulatory Visit: Payer: Medicare Other | Admitting: Family Medicine

## 2023-01-28 ENCOUNTER — Other Ambulatory Visit: Payer: Self-pay | Admitting: Podiatry

## 2023-01-28 DIAGNOSIS — L97512 Non-pressure chronic ulcer of other part of right foot with fat layer exposed: Secondary | ICD-10-CM

## 2023-01-28 MED ORDER — MUPIROCIN 2 % EX OINT: 1.0000 | TOPICAL_OINTMENT | Freq: Every day | CUTANEOUS | 0 refills | Status: DC

## 2023-01-28 NOTE — Progress Notes (Signed)
Subjective:   [name] is a 67 y.o. male who presents for Medicare Annual/Subsequent preventive examination.  Visit Complete: In person  Review of Systems     Cardiac Risk Factors include: advanced age (>87men, >22 women);hypertension;dyslipidemia;male gender;obesity (BMI >30kg/m2)     Objective:    Today's Vitals   01/22/23 1348  BP: (!) 110/58  Pulse: 77  Temp: 97.8 F (36.6 C)  SpO2: 92%  Weight: (!) 304 lb 3.2 oz (138 kg)   Body mass index is 41.26 kg/m.     01/22/2023    2:03 PM 09/20/2022    1:36 PM 07/19/2021   11:00 AM 07/19/2021   10:00 AM 07/18/2021   11:58 AM 04/25/2021   10:11 AM 11/17/2016   12:10 PM  Advanced Directives  Does Patient Have a Medical Advance Directive? Yes No Yes  Yes Yes Yes  Type of Estate agent of [place];Living will  Healthcare Power of [place];Living will Healthcare Power of [place];Living will Healthcare Power of [place];Living will  Living will;Healthcare Power of Attorney  Does patient want to make changes to medical advance directive?   No - Patient declined      Copy of Healthcare Power of Attorney in Chart? No - copy requested  No - copy requested No - copy requested   No - copy requested    Current Medications (verified) Outpatient Encounter Medications as of 01/22/2023  Medication Sig   acetaminophen (TYLENOL) 500 MG tablet Take 500 mg by mouth every 6 (six) hours as needed (for pain.).   aspirin EC 81 MG tablet Take 81 mg by mouth daily. Swallow whole.   Budeson-Glycopyrrol-Formoterol (BREZTRI AEROSPHERE) 160-9-4.8 MCG/ACT AERO Inhale 2 puffs into the lungs in the morning and at bedtime.   Cholecalciferol (VITAMIN D) 50 MCG (2000 UT) tablet Take 2,000 Units by mouth daily.   diltiazem (CARDIZEM CD) 180 MG 24 hr capsule Take 1 capsule (180 mg total) by mouth daily.   diphenhydrAMINE (BENADRYL) 25 MG tablet Take 25 mg by mouth every 6 (six) hours as needed for itching or allergies.   doxycycline  (VIBRA-TABS) 100 MG tablet Take 1 tablet (100 mg total) by mouth 2 (two) times daily.   ezetimibe (ZETIA) 10 MG tablet Take 1 tablet (10 mg total) by mouth daily.   fluticasone (FLONASE) 50 MCG/ACT nasal spray Place 1 spray into both nostrils daily as needed for allergies.   glucose blood (TRUETEST TEST) test strip Use as instructed   ketoconazole (NIZORAL) 2 % cream Apply 1 Application topically daily as needed for irritation.   lisinopril (ZESTRIL) 20 MG tablet TAKE ONE TABLET BY MOUTH ONE TIME DAILY   loratadine (CLARITIN) 10 MG tablet Take 10 mg by mouth daily.   potassium chloride SA (KLOR-CON M) 20 MEQ tablet Take 60 mEq by mouth daily.   rosuvastatin (CRESTOR) 40 MG tablet TAKE ONE TABLET BY MOUTH ONE TIME DAILY   silver sulfADIAZINE (SSD) 1 % cream Apply 1 Application topically daily.   tamsulosin (FLOMAX) 0.4 MG CAPS capsule TAKE ONE CAPSULE BY MOUTH ONCE A DAY THIRTY MINUTES AFTER THE SAME MEAL EACH DAY   torsemide (DEMADEX) 20 MG tablet Take 3 tablets (60 mg total) by mouth daily.   [DISCONTINUED] Multiple Vitamin (MULTIVITAMIN WITH MINERALS) TABS tablet Take 1 tablet by mouth in the morning.   [DISCONTINUED] TRESIBA FLEXTOUCH 200 UNIT/ML FlexTouch Pen INJECT 56 UNITS INTO THE SKIN EVERY MORNING AND INJECT 56 UNITS INTO THE SKIN EVERY EVENING (Patient taking differently: 114  Units daily. Taking 114 units in morning only)   [DISCONTINUED] doxycycline (VIBRA-TABS) 100 MG tablet Take 1 tablet (100 mg total) by mouth 2 (two) times daily.   [DISCONTINUED] empagliflozin (JARDIANCE) 25 MG TABS tablet Take 1 tablet (25 mg total) by mouth daily before breakfast.   [DISCONTINUED] torsemide (DEMADEX) 20 MG tablet Take 1 tablet (20 mg total) by mouth daily as needed.   No facility-administered encounter medications on file as of 01/22/2023.    Allergies (verified) Jardiance [empagliflozin] and Penicillins   History: Past Medical History:  Diagnosis Date   Abdominal aortic aneurysm (AAA),  30-34 mm diameter (HCC) 06/01/2017   Nml w/ greatest dm 3 cm US Aorta 06/2016, Abd [place] 05/01/2017 showed infrarenal dilatation at 3.4 cm - rec repeat US in 3 yrs.   Allergy    "pollen"   Arthritis    Cataract    removed,bilateral   Cervical disc disease 12/26/2011   Coronary artery disease involving native coronary artery of native heart without angina pectoris 12/01/2016   He had coronary angiography in 2011. There was irregularity within the LAD with up to 30% narrowing. The myocardial perfusion imaging done 11/07/2015 did not demonstrate any evidence of ischemia with a low risk nuclear stress test other than EF estimated at 47%. Chest CT 01/25/2016 showed diffuse coronary artery calcifications with heavy calcifications in the LAD. Pt seen by cardiology Dr. Mendel Ryder    Diabetes mellitus    Difficult intubation 04/05/2014   Dyslipidemia    Family history of anesthesia complication    pt states took days for his father to awaken after mask was used    Fatty liver 06/01/2017   [place] 05/01/2017   GERD (gastroesophageal reflux disease)    has been having acid reflux since recent endoscopy    H/O hiatal hernia    repair with lap band, now has ventral hernia midline abd   Hearing loss-aides 03/26/2012   Hyperlipidemia    Hypertension    Lapband APS + hiatal hernia repair Oct 2015 04/05/2014   Morbid obesity (HCC)    OSA (obstructive sleep apnea)    Rotator cuff tear    Seasonal allergies    Shortness of breath    pt states related to high BP meds with extended walking or climbling stairs   Sleep apnea    Past Surgical History:  Procedure Laterality Date   BIOPSY  09/20/2022   Procedure: BIOPSY;  Surgeon: Lemar Lofty., MD;  Location: [facility];  Service: Gastroenterology;;   CARDIAC CATHETERIZATION     approx 3 years ago    COLONOSCOPY WITH PROPOFOL N/A 03/10/2015   Procedure: COLONOSCOPY WITH PROPOFOL;  Surgeon: Rachael Fee, MD;  Location: [facility];  Service:  Endoscopy;  Laterality: N/A;   COLONOSCOPY WITH PROPOFOL N/A 09/20/2022   Procedure: COLONOSCOPY WITH PROPOFOL;  Surgeon: Meridee Score Netty Starring., MD;  Location: [facility];  Service: Gastroenterology;  Laterality: N/A;   colonscopy      2012   CORONARY ATHERECTOMY N/A 07/18/2021   Procedure: CORONARY ATHERECTOMY;  Surgeon: Corky Crafts, MD;  Location[facility] INVAS[facility] CV LAB;  Service: Cardiovascular;  Laterality: N/A;   CORONARY STENT INTERVENTION N/A 07/18/2021   Procedure: CORONARY STENT INTERVENTION;  Surgeon: Corky Crafts, MD;  [facility] INVASIVE CV LAB;  Service: Cardiovascular;  Laterality: N/A;   ESOPHAGOGASTRODUODENOSCOPY (EGD) WITH PROPOFOL N/A 03/10/2015   Procedure: ESOPHAGOGASTRODUODENOSCOPY (EGD) WITH PROPOFOL;  Surgeon: Rachael Fee, MD;  Location: [facility];  Service: Endoscopy;  Laterality: N/A;   HERNIA REPAIR     with gastric banding   LAPAROSCOPIC GASTRIC BANDING N/A 04/05/2014   Procedure: LAPAROSCOPIC GASTRIC BANDING;  Surgeon: Valarie Merino, MD;  Location: [facility];  Service: General;  Laterality: N/A;   MENISCUS REPAIR Left    left knee torn meniscus   RIGHT/LEFT HEART CATH AND CORONARY ANGIOGRAPHY N/A 07/18/2021   Procedure: RIGHT/LEFT HEART CATH AND CORONARY ANGIOGRAPHY;  Surgeon: Corky Crafts, MD;  Location: [facility] INVASIVE CV LAB;  Service: Cardiovascular;  Laterality: N/A;   VASECTOMY     Family History  Problem Relation Age of Onset   Clotting disorder Mother        lung clot   Mesothelioma Father    Hypertension Brother    Skin cancer Brother    Colon cancer Neg Hx    Colon polyps Neg Hx    Crohn's disease Neg Hx    Esophageal cancer Neg Hx    Rectal cancer Neg Hx    Stomach cancer Neg Hx    Ulcerative colitis Neg Hx    Social History   Socioeconomic History   Marital status: Married    Spouse name: Not on file   Number of children: 2   Years of education: 16   Highest education level: Bachelor's degree (e.g., BA, AB, BS)   Occupational History   Occupation: owner    Comment: Games developer   Occupation: Retired  Tobacco Use   Smoking status: Former    Current packs/day: 0.00    Average packs/day: 4.0 packs/day for 20.0 years (80.0 ttl pk-yrs)    Types: Cigarettes    Start date: 07/19/1973    Quit date: 12/13/1991    Years since quitting: 31.1   Smokeless tobacco: Never  Vaping Use   Vaping status: Never Used  Substance and Sexual Activity   Alcohol use: Yes    Alcohol/week: 3.0 - 5.0 standard drinks of alcohol    Types: 3 - 5 Glasses of wine per week    Comment: one glass of wine nightly   Drug use: No   Sexual activity: Not on file  Other Topics Concern   Not on file  Social History Narrative   Not on file   Social Determinants of Health   Financial Resource Strain: Low Risk  (01/22/2023)   Overall Financial Resource Strain (CARDIA)    Difficulty of Paying Living Expenses: Not hard at all  Food Insecurity: No Food Insecurity (01/22/2023)   Hunger Vital Sign    Worried About Running Out of Food in the Last Year: Never true    Ran Out of Food in the Last Year: Never true  Transportation Needs: No Transportation Needs (01/22/2023)   PRAPARE - Administrator, Civil Service (Medical): No    Lack of Transportation (Non-Medical): No  Physical Activity: Inactive (01/22/2023)   Exercise Vital Sign    Days of Exercise per Week: 0 days    Minutes of Exercise per Session: 0 min  Stress: No Stress Concern Present (01/22/2023)   [employer] of Occupational Health - Occupational Stress Questionnaire    Feeling of Stress : Not at all  Social Connections: Moderately Integrated (01/22/2023)   Social Connection and Isolation Panel [NHANES]    Frequency of Communication with Friends and Family: More than three times a week    Frequency of Social Gatherings with Friends and Family: Twice a week    Attends Religious Services: 1 to 4 times per year  Active Member of Clubs or Organizations: No     Attends Banker Meetings: Never    Marital Status: Married    Tobacco Counseling Counseling given: Not Answered   Clinical Intake:  Pre-visit preparation completed: Yes  Pain : No/denies pain     BMI - recorded: 41.26 Nutritional Status: BMI > 30  Obese Nutritional Risks: None Diabetes: Yes CBG done?: Yes (130 per pt) CBG resulted in Enter/ Edit results?: No Did pt. bring in CBG monitor from home?: No  How often do you need to have someone help you when you read instructions, pamphlets, or other written materials from your doctor or pharmacy?: 1 - Never  Interpreter Needed?: No  Information entered by :: Lanier Ensign, LPN   Activities of Daily Living    01/22/2023    1:55 PM  In your present state of health, do you have any difficulty performing the following activities:  Hearing? 1  Comment hearing aid / HOH  Vision? 0  Difficulty concentrating or making decisions? 0  Walking or climbing stairs? 1  Comment SOB  Dressing or bathing? 1  Comment need assistance  Doing errands, shopping? 0  Preparing Food and eating ? Y  Comment wife assist  Using the Toilet? N  In the past six months, have you accidently leaked urine? N  Do you have problems with loss of bowel control? N  Managing your Medications? N  Managing your Finances? N  Housekeeping or managing your Housekeeping? N    Patient Care Team: Ardith Dark, MD as PCP - General (Family Medicine) Christell Constant, MD as PCP - Cardiology (Cardiology) Lyn Records, MD (Inactive) as Consulting Physician (Cardiology) Rachael Fee, MD as Attending Physician (Gastroenterology) Kalman Shan, MD as Consulting Physician (Pulmonary Disease) Cherlyn Roberts, MD as Consulting Physician (Dermatology) Sidney Ace, MD as Referring Physician (Allergy) Arita Miss, MD as Attending Physician (Nephrology) Keturah Barre, MD as Consulting Physician (Otolaryngology) Tobias Alexander,  OD as Referring Physician (Optometry) Carlisle Cater, MD as Referring Physician (Internal Medicine)  Indicate any recent Medical Services you may have received from other than [facility] providers in the past year (date may be approximate).     Assessment:   This is a routine wellness examination for [facility]  Hearing/Vision screen Hearing Screening - Comments:: Pt has hearing aids  Vision Screening - Comments:: Pt follows up with Dr Earlie Server for annual eye exams   Dietary issues and exercise activities discussed:     Goals Addressed             This Visit's Progress    Patient Stated       Loss 100 lbs        Depression Screen    01/22/2023    2:03 PM 12/04/2022   10:45 AM 09/03/2022   10:11 AM 05/28/2022    9:14 AM 02/26/2022    9:12 AM 11/21/2021    1:45 PM 11/10/2021    9:51 AM  PHQ 2/9 Scores  PHQ - 2 Score 0 0 0 0 0 0 3  PHQ- 9 Score       10    Fall Risk    01/22/2023    2:09 PM 12/04/2022   10:45 AM 09/03/2022   10:12 AM 05/28/2022    9:14 AM 02/26/2022    9:12 AM  Fall Risk   Falls in the past year? 0 0 0 0 0  Number falls in past yr: 0 0  0 0 0  Injury with Fall? 0 0 0 0 0  Risk for fall due to : Impaired vision;Impaired balance/gait No Fall Risks No Fall Risks No Fall Risks No Fall Risks  Follow up Falls prevention discussed        MEDICARE RISK AT HOME:    TIMED UP AND GO:  Was the test performed?  No    Cognitive Function:        01/24/2023    8:54 AM  6CIT Screen  What Year? 0 points  What month? 0 points  What time? 0 points  Count back from 20 0 points  Months in reverse 0 points  Repeat phrase 0 points  Total Score 0 points    Immunizations Immunization History  Administered Date(s) Administered   Influenza, High Dose Seasonal PF 04/30/2018   Influenza,inj,Quad PF,6+ Mos 06/30/2016, 03/14/2017   Moderna Sars-Covid-2 Vaccination 10/14/2020   PFIZER(Purple Top)SARS-COV-2 Vaccination 09/10/2019, 10/05/2019   Pneumococcal  Polysaccharide-23 10/09/2012   Tdap 02/09/2015    TDAP status: Up to date  Flu Vaccine status: Due, Education has been provided regarding the importance of this vaccine. Advised may receive this vaccine at local pharmacy or Health Dept. Aware to provide a copy of the vaccination record if obtained from local pharmacy or Health Dept. Verbalized acceptance and understanding.  Pneumococcal vaccine status: Due, Education has been provided regarding the importance of this vaccine. Advised may receive this vaccine at local pharmacy or Health Dept. Aware to provide a copy of the vaccination record if obtained from local pharmacy or Health Dept. Verbalized acceptance and understanding.  Covid-19 vaccine status: Completed vaccines  Qualifies for Shingles Vaccine? Yes   Zostavax completed No   Shingrix Completed?: No.    Education has been provided regarding the importance of this vaccine. Patient has been advised to call insurance company to determine out of pocket expense if they have not yet received this vaccine. Advised may also receive vaccine at local pharmacy or Health Dept. Verbalized acceptance and understanding.  Screening Tests Health Maintenance  Topic Date Due   OPHTHALMOLOGY EXAM  07/02/2019   Zoster Vaccines- Shingrix (1 of 2) 04/24/2023 (Originally 03/04/1975)   Pneumonia Vaccine 5+ Years old (2 of 2 - PCV) 05/29/2023 (Originally 10/09/2013)   INFLUENZA VACCINE  09/16/2023 (Originally 01/17/2023)   FOOT EXAM  02/23/2023   HEMOGLOBIN A1C  06/05/2023   Diabetic kidney evaluation - eGFR measurement  12/04/2023   Diabetic kidney evaluation - Urine ACR  12/04/2023   Medicare Annual Wellness (AWV)  01/22/2024   DTaP/Tdap/Td (2 - Td or Tdap) 02/08/2025   Colonoscopy  09/20/2027   Hepatitis C Screening  Completed   HPV VACCINES  Aged Out   COVID-19 Vaccine  Discontinued    Health Maintenance  Health Maintenance Due  Topic Date Due   OPHTHALMOLOGY EXAM  07/02/2019    Colorectal  cancer screening: Type of screening: Colonoscopy. Completed 09/20/22. Repeat every 5 years Additional Screening:  Hepatitis C Screening:  Completed 01/13/14  Vision Screening: Recommended annual ophthalmology exams for early detection of glaucoma and other disorders of the eye. Is the patient up to date with their annual eye exam?  Yes  Who is the provider or what is the name of the office in which the patient attends annual eye exams? Dr Kennyth Arnold palmer  If pt is not established with a provider, would they like to be referred to a provider to establish care? No .   Dental Screening: Recommended annual dental exams  for proper oral hygiene  Diabetic Foot Exam: Diabetic Foot Exam: Completed 02/22/22  Community Resource Referral / Chronic Care Management: CRR required this visit?  No   CCM required this visit?  No     Plan:     I have personally reviewed and noted the following in the patient's chart:   Medical and social history Use of alcohol, tobacco or illicit drugs  Current medications and supplements including opioid prescriptions. Patient is not currently taking opioid prescriptions. Functional ability and status Nutritional status Physical activity Advanced directives List of other physicians Hospitalizations, surgeries, and ER visits in previous 12 months Vitals Screenings to include cognitive, depression, and falls Referrals and appointments  In addition, I have reviewed and discussed with patient certain preventive protocols, quality metrics, and best practice recommendations. A written personalized care plan for preventive services as well as general preventive health recommendations were provided to patient.     Marzella Schlein, LPN   [id number]   After Visit Summary: (MyChart) Due to this being a telephonic visit, the after visit summary with patients personalized plan was offered to patient via MyChart   Nurse Notes: none

## 2023-01-28 NOTE — Progress Notes (Signed)
Mupirocin ointment Rx sent

## 2023-01-29 ENCOUNTER — Other Ambulatory Visit: Payer: Self-pay | Admitting: *Deleted

## 2023-01-29 ENCOUNTER — Encounter: Payer: Self-pay | Admitting: Family Medicine

## 2023-01-29 MED ORDER — TRESIBA FLEXTOUCH 200 UNIT/ML ~~LOC~~ SOPN: PEN_INJECTOR | SUBCUTANEOUS | 1 refills | Status: DC

## 2023-01-29 NOTE — Telephone Encounter (Signed)
New prescription was send to day with 90 day supply

## 2023-01-31 ENCOUNTER — Encounter (INDEPENDENT_AMBULATORY_CARE_PROVIDER_SITE_OTHER): Payer: Self-pay

## 2023-02-04 ENCOUNTER — Other Ambulatory Visit: Payer: Self-pay | Admitting: Oncology

## 2023-02-04 DIAGNOSIS — Z006 Encounter for examination for normal comparison and control in clinical research program: Secondary | ICD-10-CM

## 2023-02-14 ENCOUNTER — Ambulatory Visit (INDEPENDENT_AMBULATORY_CARE_PROVIDER_SITE_OTHER): Payer: Medicare Other | Admitting: Podiatry

## 2023-02-14 DIAGNOSIS — E1149 Type 2 diabetes mellitus with other diabetic neurological complication: Secondary | ICD-10-CM

## 2023-02-14 DIAGNOSIS — B351 Tinea unguium: Secondary | ICD-10-CM | POA: Diagnosis not present

## 2023-02-14 DIAGNOSIS — L84 Corns and callosities: Secondary | ICD-10-CM | POA: Diagnosis not present

## 2023-02-14 DIAGNOSIS — Z7901 Long term (current) use of anticoagulants: Secondary | ICD-10-CM

## 2023-02-15 ENCOUNTER — Ambulatory Visit (HOSPITAL_COMMUNITY)
Admission: RE | Admit: 2023-02-15 | Discharge: 2023-02-15 | Disposition: A | Discharge status: Home / Self Care | Payer: Medicare Other | Source: Ambulatory Visit | Attending: Surgery | Admitting: Surgery

## 2023-02-15 DIAGNOSIS — K824 Cholesterolosis of gallbladder: Secondary | ICD-10-CM

## 2023-02-15 DIAGNOSIS — Z9884 Bariatric surgery status: Secondary | ICD-10-CM | POA: Insufficient documentation

## 2023-02-23 NOTE — Progress Notes (Signed)
Subjective:  67 year old male presents the office for follow-up evaluation of ulcers to both of his big toes.  States he is doing much better.  There is no open lesions at this time that the calluses formed he also uses nails trimmed today as are thickened elongated he cannot do them himself.  He also presents to pick up his custom sandal.  Does not report any fevers or chills.   Objective: AAO x3, NAD DP/PT pulses palpable bilaterally, CRT less than 3 seconds On the plantar aspects of bilateral hallux hyperkeratotic tissue.  There is no underlying ulceration drainage or any signs of infection noted today but areas are preulcerative. Nails are hypertrophic, dystrophic, brittle, discolored, elongated 10. No surrounding redness or drainage. Tenderness nails 1-5 bilaterally.  Chronic bilateral lower extremity edema present. No pain with calf compression, swelling, warmth, erythema  Assessment: He also callus, symptomatic (  Plan: -All treatment options discussed with the patient including all alternatives, risks, complications.  -Debrided the hyperkeratotic lesions x 2 without any complications or bleeding.  There is no reoccurrence of the ulceration at this time but continue to monitor. -Sharply debrided the nails x 10 without any complications or bleeding. -I dispensed his custom sandal with offloading.  Discussed break-in instructions. -Monitor for any clinical signs or symptoms of infection and directed to call the office immediately should any occur or go to the ER.  Return in about 6 weeks (around 03/28/2023).  Vivi Barrack DPM

## 2023-02-24 ENCOUNTER — Other Ambulatory Visit: Payer: Self-pay | Admitting: Internal Medicine

## 2023-02-25 ENCOUNTER — Encounter: Payer: Self-pay | Admitting: Pulmonary Disease

## 2023-02-25 ENCOUNTER — Telehealth (HOSPITAL_COMMUNITY): Payer: Self-pay

## 2023-02-25 ENCOUNTER — Ambulatory Visit (INDEPENDENT_AMBULATORY_CARE_PROVIDER_SITE_OTHER): Payer: 59 | Admitting: Pulmonary Disease

## 2023-02-25 ENCOUNTER — Ambulatory Visit (INDEPENDENT_AMBULATORY_CARE_PROVIDER_SITE_OTHER): Payer: Medicare Other | Admitting: Pulmonary Disease

## 2023-02-25 VITALS — BP 108/62 | HR 83 | Temp 98.1°F | Ht 72.0 in | Wt 303.6 lb

## 2023-02-25 DIAGNOSIS — R0609 Other forms of dyspnea: Secondary | ICD-10-CM

## 2023-02-25 LAB — PULMONARY FUNCTION TEST
DL/VA % pred: 111 %
DL/VA: 4.52 ml/min/mmHg/L
DLCO cor % pred: 80 %
DLCO cor: 22.5 ml/min/mmHg
DLCO unc % pred: 80 %
DLCO unc: 22.5 ml/min/mmHg
FEF 25-75 Post: 3.19 L/s
FEF 25-75 Pre: 2 L/s
FEF2575-%Change-Post: 60 %
FEF2575-%Pred-Post: 113 %
FEF2575-%Pred-Pre: 70 %
FEV1-%Change-Post: 11 %
FEV1-%Pred-Post: 65 %
FEV1-%Pred-Pre: 58 %
FEV1-Post: 2.36 L
FEV1-Pre: 2.12 L
FEV1FVC-%Change-Post: 1 %
FEV1FVC-%Pred-Pre: 107 %
FEV6-%Change-Post: 9 %
FEV6-%Pred-Post: 62 %
FEV6-%Pred-Pre: 57 %
FEV6-Post: 2.88 L
FEV6-Pre: 2.64 L
FEV6FVC-%Change-Post: 0 %
FEV6FVC-%Pred-Post: 105 %
FEV6FVC-%Pred-Pre: 104 %
FVC-%Change-Post: 9 %
FVC-%Pred-Post: 59 %
FVC-%Pred-Pre: 54 %
FVC-Post: 2.92 L
FVC-Pre: 2.66 L
Post FEV1/FVC ratio: 81 %
Post FEV6/FVC ratio: 100 %
Pre FEV1/FVC ratio: 79 %
Pre FEV6/FVC Ratio: 99 %
RV % pred: -2 %
RV: -0.07 L
TLC % pred: 49 %
TLC: 3.67 L

## 2023-02-25 NOTE — Patient Instructions (Signed)
Full PFT performed today. °

## 2023-02-25 NOTE — Telephone Encounter (Signed)
Called patient to see if he was interested in participating in the Pulmonary Rehab Program. Patient stated yes. Patient will come in for orientation on 02/27/23 @ 1:00PM and will attend the 10:15AM exercise class.   Pensions consultant.

## 2023-02-25 NOTE — Telephone Encounter (Signed)
Pt insurance is active and benefits verified through Medicare a/b Co-pay 0, DED $240/$240 met, out of pocket 0/0 met, co-insurance 20%. no pre-authorization required.   2ndary insurance is active and benefits verified through [employer]. Co-pay 0, DED 0/0 met, out of pocket 0/0 met, co-insurance 0%. No pre-authorization required.

## 2023-02-25 NOTE — Progress Notes (Signed)
Cardiology Office Note:    Date:  03/04/2023   ID:  [name], DOB 1956/05/08, MRN [id number]  PCP:  [name] Dark, MD   [facility] Providers Cardiologist:  [name] Constant, MD     Referring MD: [name] Dark, MD   CC: CAD follow up  History of Present Illness:    [name] is [name] 67 y.o. male with [name] hx of mild non obstructive CAD, AAA,Morbid Obesity, DM, and HTN; OSA on CPAP COVID-19 2022: patient started on lasix with some improved symptoms, then needed torsemide, preserved EF  2023: CAD and had PCI, CPET suggestive of deconditioning, saw pulm, and V/Q scan.   Called with SOB.  Seen PT advice notes: he met [name] cardiologist at [facility] that helped him through the airport.  Decreased down to two glass of wine. Stopped BB.  Re-offered sleep study.  Completed cardiac rehab. 2024: Is back to being tired again (had increased alcohol consumption). Planned for trip to [place].  [name], [name] 67 year old individual with [name] history of obstructive coronary artery disease with prior intervention, obstructive sleep apnea managed with CPAP, abdominal aortic aneurysm, morbid obesity, alcohol misuse, and obstructive lung disease, presents for [name] follow-up visit. The patient reports [name] decrease in blood pressure since the last visit, currently measuring 90/60. He has been attempting to reduce stress, including considering the use of cannabis gummies. The patient has [name] history of alcohol misuse but stopped drinking in 2023, which coincided with [name] period of increased energy. However, in 2024, he resumed alcohol consumption and reported feeling worse. The patient's wife is also present for the visit, and they both reside in [place], [place].  The patient has been managing his obstructive sleep apnea with CPAP and has been on various doses of dilatizem with both hypotension and palpitations. He has also been on [name] medication called Jardiance for diabetes, which he stopped due  to side effects but is considering resuming at [name] lower dose. The patient also reports difficulty breathing, becoming winded after walking 100 yards. He has recently undergone pulmonary testing and has been prescribed [name] puffer, which he has been using for [name] few days but has not noticed an improvement in his breathing.   Past Medical History:  Diagnosis Date   Abdominal aortic aneurysm (AAA), 30-34 mm diameter (HCC) 06/01/2017   Nml w/ greatest dm 3 cm US Aorta 06/2016, Abd [place] 05/01/2017 showed infrarenal dilatation at 3.4 cm - rec repeat US in 3 yrs.   Allergy    "pollen"   Arthritis    Cataract    removed,bilateral   Cervical disc disease 12/26/2011   Coronary artery disease involving native coronary artery of native heart without angina pectoris 12/01/2016   He had coronary angiography in 2011. There was irregularity within the LAD with up to 30% narrowing. The myocardial perfusion imaging done 11/07/2015 did not demonstrate any evidence of ischemia with [name] low risk nuclear stress test other than EF estimated at 47%. Chest CT 01/25/2016 showed diffuse coronary artery calcifications with heavy calcifications in the LAD. Pt seen by cardiology Dr. Mendel [name]    Diabetes mellitus    Difficult intubation 04/05/2014   Dyslipidemia    Family history of anesthesia complication    pt states took days for his father to awaken after mask was used    Fatty liver 06/01/2017   [place] 05/01/2017   GERD (gastroesophageal reflux disease)    has been having acid reflux since  Post-Intervention Lesion Assessment The intervention was  successful. Pre-interventional TIMI flow is 3. Post-intervention TIMI flow is 3. No complications occurred at this lesion. Ultrasound (IVUS) was performed on the lesion post PCI using [name] CATHETER OPTICROSS HD. Stent well apposed. There is [name] 0% residual stenosis post intervention.  Mid RCA lesion Stent Lesion crossed with guidewire using [name] WIRE ASAHI PROWATER 180CM. Pre-stent angioplasty was not performed. [name] drug-eluting stent was successfully placed using [name] STENT ONYX FRONTIER 4.0X15. Stent strut is well apposed. Post-stent angioplasty was performed using [name] BALLN  Stronach SAPPHIRE 4.5X12. Post-Intervention Lesion Assessment The intervention was successful. Pre-interventional TIMI flow is 3. Post-intervention TIMI flow is 3. No complications occurred at this lesion. Ultrasound (IVUS) was performed on the lesion post PCI using [name] CATHETER OPTICROSS HD. Stent well apposed. There is [name] 0% residual stenosis post intervention.   STRESS TESTS  MYOCARDIAL PERFUSION IMAGING 11/08/2015  Narrative  Nuclear stress EF: 47%. The left ventricular ejection fraction is mildly decreased (45-54%).  There was no ST segment deviation noted during stress.  This is [name] low risk study.  There is no evidence of ischemia or previous infarction   ECHOCARDIOGRAM  ECHOCARDIOGRAM COMPLETE 02/27/2022  Narrative ECHOCARDIOGRAM REPORT    Patient Name:   [name] Date of Exam: 02/27/2022 Medical Rec #:  [id number]        Height:       70.0 in Accession #:    [id number]       Weight:       305.0 lb Date of Birth:  Aug 04, 1955        BSA:          2.497 m Patient Age:    65 years         BP:           123/73 mmHg Patient Gender: M                HR:           72 bpm. Exam Location:  [name]  Procedure: 2D Echo, Cardiac Doppler, Color Doppler and Strain Analysis  Indications:    I50.30 heart failure with preserved EF  History:        Patient has prior history of Echocardiogram examinations, most recent  02/03/2021. CAD, AAA, Pulmonary embolus, Signs/Symptoms:Dyspnea; Risk Factors:Diabetes, Hypertension and Dyslipidemia.  Sonographer:    [name] Referring Phys: [id number] [name] [name]  IMPRESSIONS   1. Left ventricular ejection fraction, by estimation, is 60 to 65%. The left ventricle has normal function. The left ventricle has no regional wall motion abnormalities. Left ventricular diastolic parameters are consistent with Grade I diastolic dysfunction (impaired relaxation). The average left ventricular global longitudinal strain is -23.7 %. The global longitudinal strain is normal. 2. Right ventricular systolic function is normal. The right ventricular size is normal. 3. The mitral valve is normal in structure. No evidence of mitral valve regurgitation. No evidence of mitral stenosis. 4. The aortic valve is tricuspid. There is mild calcification of the aortic valve. There is mild thickening of the aortic valve. Aortic valve regurgitation is not visualized. Aortic valve sclerosis is present, with no evidence of aortic valve stenosis. 5. The inferior vena cava is normal in size with greater than 50% respiratory variability, suggesting right atrial pressure of 3 mmHg.  Comparison(s): No significant change from prior study. Prior images reviewed side by side.  FINDINGS Left Ventricle: Left ventricular ejection fraction, by estimation, is 60 to 65%. The  Cardiology Office Note:    Date:  03/04/2023   ID:  [name], DOB 1956/05/08, MRN [id number]  PCP:  [name] Dark, MD   [facility] Providers Cardiologist:  [name] Constant, MD     Referring MD: [name] Dark, MD   CC: CAD follow up  History of Present Illness:    [name] is [name] 67 y.o. male with [name] hx of mild non obstructive CAD, AAA,Morbid Obesity, DM, and HTN; OSA on CPAP COVID-19 2022: patient started on lasix with some improved symptoms, then needed torsemide, preserved EF  2023: CAD and had PCI, CPET suggestive of deconditioning, saw pulm, and V/Q scan.   Called with SOB.  Seen PT advice notes: he met [name] cardiologist at [facility] that helped him through the airport.  Decreased down to two glass of wine. Stopped BB.  Re-offered sleep study.  Completed cardiac rehab. 2024: Is back to being tired again (had increased alcohol consumption). Planned for trip to [place].  [name], [name] 67 year old individual with [name] history of obstructive coronary artery disease with prior intervention, obstructive sleep apnea managed with CPAP, abdominal aortic aneurysm, morbid obesity, alcohol misuse, and obstructive lung disease, presents for [name] follow-up visit. The patient reports [name] decrease in blood pressure since the last visit, currently measuring 90/60. He has been attempting to reduce stress, including considering the use of cannabis gummies. The patient has [name] history of alcohol misuse but stopped drinking in 2023, which coincided with [name] period of increased energy. However, in 2024, he resumed alcohol consumption and reported feeling worse. The patient's wife is also present for the visit, and they both reside in [place], [place].  The patient has been managing his obstructive sleep apnea with CPAP and has been on various doses of dilatizem with both hypotension and palpitations. He has also been on [name] medication called Jardiance for diabetes, which he stopped due  to side effects but is considering resuming at [name] lower dose. The patient also reports difficulty breathing, becoming winded after walking 100 yards. He has recently undergone pulmonary testing and has been prescribed [name] puffer, which he has been using for [name] few days but has not noticed an improvement in his breathing.   Past Medical History:  Diagnosis Date   Abdominal aortic aneurysm (AAA), 30-34 mm diameter (HCC) 06/01/2017   Nml w/ greatest dm 3 cm US Aorta 06/2016, Abd [place] 05/01/2017 showed infrarenal dilatation at 3.4 cm - rec repeat US in 3 yrs.   Allergy    "pollen"   Arthritis    Cataract    removed,bilateral   Cervical disc disease 12/26/2011   Coronary artery disease involving native coronary artery of native heart without angina pectoris 12/01/2016   He had coronary angiography in 2011. There was irregularity within the LAD with up to 30% narrowing. The myocardial perfusion imaging done 11/07/2015 did not demonstrate any evidence of ischemia with [name] low risk nuclear stress test other than EF estimated at 47%. Chest CT 01/25/2016 showed diffuse coronary artery calcifications with heavy calcifications in the LAD. Pt seen by cardiology Dr. Mendel [name]    Diabetes mellitus    Difficult intubation 04/05/2014   Dyslipidemia    Family history of anesthesia complication    pt states took days for his father to awaken after mask was used    Fatty liver 06/01/2017   [place] 05/01/2017   GERD (gastroesophageal reflux disease)    has been having acid reflux since  Cardiology Office Note:    Date:  03/04/2023   ID:  [name], DOB 1956/05/08, MRN [id number]  PCP:  [name] Dark, MD   [facility] Providers Cardiologist:  [name] Constant, MD     Referring MD: [name] Dark, MD   CC: CAD follow up  History of Present Illness:    [name] is [name] 67 y.o. male with [name] hx of mild non obstructive CAD, AAA,Morbid Obesity, DM, and HTN; OSA on CPAP COVID-19 2022: patient started on lasix with some improved symptoms, then needed torsemide, preserved EF  2023: CAD and had PCI, CPET suggestive of deconditioning, saw pulm, and V/Q scan.   Called with SOB.  Seen PT advice notes: he met [name] cardiologist at [facility] that helped him through the airport.  Decreased down to two glass of wine. Stopped BB.  Re-offered sleep study.  Completed cardiac rehab. 2024: Is back to being tired again (had increased alcohol consumption). Planned for trip to [place].  [name], [name] 67 year old individual with [name] history of obstructive coronary artery disease with prior intervention, obstructive sleep apnea managed with CPAP, abdominal aortic aneurysm, morbid obesity, alcohol misuse, and obstructive lung disease, presents for [name] follow-up visit. The patient reports [name] decrease in blood pressure since the last visit, currently measuring 90/60. He has been attempting to reduce stress, including considering the use of cannabis gummies. The patient has [name] history of alcohol misuse but stopped drinking in 2023, which coincided with [name] period of increased energy. However, in 2024, he resumed alcohol consumption and reported feeling worse. The patient's wife is also present for the visit, and they both reside in [place], [place].  The patient has been managing his obstructive sleep apnea with CPAP and has been on various doses of dilatizem with both hypotension and palpitations. He has also been on [name] medication called Jardiance for diabetes, which he stopped due  to side effects but is considering resuming at [name] lower dose. The patient also reports difficulty breathing, becoming winded after walking 100 yards. He has recently undergone pulmonary testing and has been prescribed [name] puffer, which he has been using for [name] few days but has not noticed an improvement in his breathing.   Past Medical History:  Diagnosis Date   Abdominal aortic aneurysm (AAA), 30-34 mm diameter (HCC) 06/01/2017   Nml w/ greatest dm 3 cm US Aorta 06/2016, Abd [place] 05/01/2017 showed infrarenal dilatation at 3.4 cm - rec repeat US in 3 yrs.   Allergy    "pollen"   Arthritis    Cataract    removed,bilateral   Cervical disc disease 12/26/2011   Coronary artery disease involving native coronary artery of native heart without angina pectoris 12/01/2016   He had coronary angiography in 2011. There was irregularity within the LAD with up to 30% narrowing. The myocardial perfusion imaging done 11/07/2015 did not demonstrate any evidence of ischemia with [name] low risk nuclear stress test other than EF estimated at 47%. Chest CT 01/25/2016 showed diffuse coronary artery calcifications with heavy calcifications in the LAD. Pt seen by cardiology Dr. Mendel [name]    Diabetes mellitus    Difficult intubation 04/05/2014   Dyslipidemia    Family history of anesthesia complication    pt states took days for his father to awaken after mask was used    Fatty liver 06/01/2017   [place] 05/01/2017   GERD (gastroesophageal reflux disease)    has been having acid reflux since  Cardiology Office Note:    Date:  03/04/2023   ID:  [name], DOB 1956/05/08, MRN [id number]  PCP:  [name] Dark, MD   [facility] Providers Cardiologist:  [name] Constant, MD     Referring MD: [name] Dark, MD   CC: CAD follow up  History of Present Illness:    [name] is [name] 67 y.o. male with [name] hx of mild non obstructive CAD, AAA,Morbid Obesity, DM, and HTN; OSA on CPAP COVID-19 2022: patient started on lasix with some improved symptoms, then needed torsemide, preserved EF  2023: CAD and had PCI, CPET suggestive of deconditioning, saw pulm, and V/Q scan.   Called with SOB.  Seen PT advice notes: he met [name] cardiologist at [facility] that helped him through the airport.  Decreased down to two glass of wine. Stopped BB.  Re-offered sleep study.  Completed cardiac rehab. 2024: Is back to being tired again (had increased alcohol consumption). Planned for trip to [place].  [name], [name] 67 year old individual with [name] history of obstructive coronary artery disease with prior intervention, obstructive sleep apnea managed with CPAP, abdominal aortic aneurysm, morbid obesity, alcohol misuse, and obstructive lung disease, presents for [name] follow-up visit. The patient reports [name] decrease in blood pressure since the last visit, currently measuring 90/60. He has been attempting to reduce stress, including considering the use of cannabis gummies. The patient has [name] history of alcohol misuse but stopped drinking in 2023, which coincided with [name] period of increased energy. However, in 2024, he resumed alcohol consumption and reported feeling worse. The patient's wife is also present for the visit, and they both reside in [place], [place].  The patient has been managing his obstructive sleep apnea with CPAP and has been on various doses of dilatizem with both hypotension and palpitations. He has also been on [name] medication called Jardiance for diabetes, which he stopped due  to side effects but is considering resuming at [name] lower dose. The patient also reports difficulty breathing, becoming winded after walking 100 yards. He has recently undergone pulmonary testing and has been prescribed [name] puffer, which he has been using for [name] few days but has not noticed an improvement in his breathing.   Past Medical History:  Diagnosis Date   Abdominal aortic aneurysm (AAA), 30-34 mm diameter (HCC) 06/01/2017   Nml w/ greatest dm 3 cm US Aorta 06/2016, Abd [place] 05/01/2017 showed infrarenal dilatation at 3.4 cm - rec repeat US in 3 yrs.   Allergy    "pollen"   Arthritis    Cataract    removed,bilateral   Cervical disc disease 12/26/2011   Coronary artery disease involving native coronary artery of native heart without angina pectoris 12/01/2016   He had coronary angiography in 2011. There was irregularity within the LAD with up to 30% narrowing. The myocardial perfusion imaging done 11/07/2015 did not demonstrate any evidence of ischemia with [name] low risk nuclear stress test other than EF estimated at 47%. Chest CT 01/25/2016 showed diffuse coronary artery calcifications with heavy calcifications in the LAD. Pt seen by cardiology Dr. Mendel [name]    Diabetes mellitus    Difficult intubation 04/05/2014   Dyslipidemia    Family history of anesthesia complication    pt states took days for his father to awaken after mask was used    Fatty liver 06/01/2017   [place] 05/01/2017   GERD (gastroesophageal reflux disease)    has been having acid reflux since  Cardiology Office Note:    Date:  03/04/2023   ID:  [name], DOB 1956/05/08, MRN [id number]  PCP:  [name] Dark, MD   [facility] Providers Cardiologist:  [name] Constant, MD     Referring MD: [name] Dark, MD   CC: CAD follow up  History of Present Illness:    [name] is [name] 67 y.o. male with [name] hx of mild non obstructive CAD, AAA,Morbid Obesity, DM, and HTN; OSA on CPAP COVID-19 2022: patient started on lasix with some improved symptoms, then needed torsemide, preserved EF  2023: CAD and had PCI, CPET suggestive of deconditioning, saw pulm, and V/Q scan.   Called with SOB.  Seen PT advice notes: he met [name] cardiologist at [facility] that helped him through the airport.  Decreased down to two glass of wine. Stopped BB.  Re-offered sleep study.  Completed cardiac rehab. 2024: Is back to being tired again (had increased alcohol consumption). Planned for trip to [place].  [name], [name] 67 year old individual with [name] history of obstructive coronary artery disease with prior intervention, obstructive sleep apnea managed with CPAP, abdominal aortic aneurysm, morbid obesity, alcohol misuse, and obstructive lung disease, presents for [name] follow-up visit. The patient reports [name] decrease in blood pressure since the last visit, currently measuring 90/60. He has been attempting to reduce stress, including considering the use of cannabis gummies. The patient has [name] history of alcohol misuse but stopped drinking in 2023, which coincided with [name] period of increased energy. However, in 2024, he resumed alcohol consumption and reported feeling worse. The patient's wife is also present for the visit, and they both reside in [place], [place].  The patient has been managing his obstructive sleep apnea with CPAP and has been on various doses of dilatizem with both hypotension and palpitations. He has also been on [name] medication called Jardiance for diabetes, which he stopped due  to side effects but is considering resuming at [name] lower dose. The patient also reports difficulty breathing, becoming winded after walking 100 yards. He has recently undergone pulmonary testing and has been prescribed [name] puffer, which he has been using for [name] few days but has not noticed an improvement in his breathing.   Past Medical History:  Diagnosis Date   Abdominal aortic aneurysm (AAA), 30-34 mm diameter (HCC) 06/01/2017   Nml w/ greatest dm 3 cm US Aorta 06/2016, Abd [place] 05/01/2017 showed infrarenal dilatation at 3.4 cm - rec repeat US in 3 yrs.   Allergy    "pollen"   Arthritis    Cataract    removed,bilateral   Cervical disc disease 12/26/2011   Coronary artery disease involving native coronary artery of native heart without angina pectoris 12/01/2016   He had coronary angiography in 2011. There was irregularity within the LAD with up to 30% narrowing. The myocardial perfusion imaging done 11/07/2015 did not demonstrate any evidence of ischemia with [name] low risk nuclear stress test other than EF estimated at 47%. Chest CT 01/25/2016 showed diffuse coronary artery calcifications with heavy calcifications in the LAD. Pt seen by cardiology Dr. Mendel [name]    Diabetes mellitus    Difficult intubation 04/05/2014   Dyslipidemia    Family history of anesthesia complication    pt states took days for his father to awaken after mask was used    Fatty liver 06/01/2017   [place] 05/01/2017   GERD (gastroesophageal reflux disease)    has been having acid reflux since  Cardiology Office Note:    Date:  03/04/2023   ID:  [name], DOB 1956/05/08, MRN [id number]  PCP:  [name] Dark, MD   [facility] Providers Cardiologist:  [name] Constant, MD     Referring MD: [name] Dark, MD   CC: CAD follow up  History of Present Illness:    [name] is [name] 67 y.o. male with [name] hx of mild non obstructive CAD, AAA,Morbid Obesity, DM, and HTN; OSA on CPAP COVID-19 2022: patient started on lasix with some improved symptoms, then needed torsemide, preserved EF  2023: CAD and had PCI, CPET suggestive of deconditioning, saw pulm, and V/Q scan.   Called with SOB.  Seen PT advice notes: he met [name] cardiologist at [facility] that helped him through the airport.  Decreased down to two glass of wine. Stopped BB.  Re-offered sleep study.  Completed cardiac rehab. 2024: Is back to being tired again (had increased alcohol consumption). Planned for trip to [place].  [name], [name] 67 year old individual with [name] history of obstructive coronary artery disease with prior intervention, obstructive sleep apnea managed with CPAP, abdominal aortic aneurysm, morbid obesity, alcohol misuse, and obstructive lung disease, presents for [name] follow-up visit. The patient reports [name] decrease in blood pressure since the last visit, currently measuring 90/60. He has been attempting to reduce stress, including considering the use of cannabis gummies. The patient has [name] history of alcohol misuse but stopped drinking in 2023, which coincided with [name] period of increased energy. However, in 2024, he resumed alcohol consumption and reported feeling worse. The patient's wife is also present for the visit, and they both reside in [place], [place].  The patient has been managing his obstructive sleep apnea with CPAP and has been on various doses of dilatizem with both hypotension and palpitations. He has also been on [name] medication called Jardiance for diabetes, which he stopped due  to side effects but is considering resuming at [name] lower dose. The patient also reports difficulty breathing, becoming winded after walking 100 yards. He has recently undergone pulmonary testing and has been prescribed [name] puffer, which he has been using for [name] few days but has not noticed an improvement in his breathing.   Past Medical History:  Diagnosis Date   Abdominal aortic aneurysm (AAA), 30-34 mm diameter (HCC) 06/01/2017   Nml w/ greatest dm 3 cm US Aorta 06/2016, Abd [place] 05/01/2017 showed infrarenal dilatation at 3.4 cm - rec repeat US in 3 yrs.   Allergy    "pollen"   Arthritis    Cataract    removed,bilateral   Cervical disc disease 12/26/2011   Coronary artery disease involving native coronary artery of native heart without angina pectoris 12/01/2016   He had coronary angiography in 2011. There was irregularity within the LAD with up to 30% narrowing. The myocardial perfusion imaging done 11/07/2015 did not demonstrate any evidence of ischemia with [name] low risk nuclear stress test other than EF estimated at 47%. Chest CT 01/25/2016 showed diffuse coronary artery calcifications with heavy calcifications in the LAD. Pt seen by cardiology Dr. Mendel [name]    Diabetes mellitus    Difficult intubation 04/05/2014   Dyslipidemia    Family history of anesthesia complication    pt states took days for his father to awaken after mask was used    Fatty liver 06/01/2017   [place] 05/01/2017   GERD (gastroesophageal reflux disease)    has been having acid reflux since  Cardiology Office Note:    Date:  03/04/2023   ID:  [name], DOB 1956/05/08, MRN [id number]  PCP:  [name] Dark, MD   [facility] Providers Cardiologist:  [name] Constant, MD     Referring MD: [name] Dark, MD   CC: CAD follow up  History of Present Illness:    [name] is [name] 67 y.o. male with [name] hx of mild non obstructive CAD, AAA,Morbid Obesity, DM, and HTN; OSA on CPAP COVID-19 2022: patient started on lasix with some improved symptoms, then needed torsemide, preserved EF  2023: CAD and had PCI, CPET suggestive of deconditioning, saw pulm, and V/Q scan.   Called with SOB.  Seen PT advice notes: he met [name] cardiologist at [facility] that helped him through the airport.  Decreased down to two glass of wine. Stopped BB.  Re-offered sleep study.  Completed cardiac rehab. 2024: Is back to being tired again (had increased alcohol consumption). Planned for trip to [place].  [name], [name] 67 year old individual with [name] history of obstructive coronary artery disease with prior intervention, obstructive sleep apnea managed with CPAP, abdominal aortic aneurysm, morbid obesity, alcohol misuse, and obstructive lung disease, presents for [name] follow-up visit. The patient reports [name] decrease in blood pressure since the last visit, currently measuring 90/60. He has been attempting to reduce stress, including considering the use of cannabis gummies. The patient has [name] history of alcohol misuse but stopped drinking in 2023, which coincided with [name] period of increased energy. However, in 2024, he resumed alcohol consumption and reported feeling worse. The patient's wife is also present for the visit, and they both reside in [place]. [place], [place].  The patient has been managing his obstructive sleep apnea with CPAP and has been on various doses of dilatizem with both hypotension and palpitations. He has also been on [name] medication called Jardiance for diabetes, which he stopped due  to side effects but is considering resuming at [name] lower dose. The patient also reports difficulty breathing, becoming winded after walking 100 yards. He has recently undergone pulmonary testing and has been prescribed [name] puffer, which he has been using for [name] few days but has not noticed an improvement in his breathing.   Past Medical History:  Diagnosis Date   Abdominal aortic aneurysm (AAA), 30-34 mm diameter (HCC) 06/01/2017   Nml w/ greatest dm 3 cm US Aorta 06/2016, Abd [place] 05/01/2017 showed infrarenal dilatation at 3.4 cm - rec repeat US in 3 yrs.   Allergy    "pollen"   Arthritis    Cataract    removed,bilateral   Cervical disc disease 12/26/2011   Coronary artery disease involving native coronary artery of native heart without angina pectoris 12/01/2016   He had coronary angiography in 2011. There was irregularity within the LAD with up to 30% narrowing. The myocardial perfusion imaging done 11/07/2015 did not demonstrate any evidence of ischemia with [name] low risk nuclear stress test other than EF estimated at 47%. Chest CT 01/25/2016 showed diffuse coronary artery calcifications with heavy calcifications in the LAD. Pt seen by cardiology Dr. Mendel [name]    Diabetes mellitus    Difficult intubation 04/05/2014   Dyslipidemia    Family history of anesthesia complication    pt states took days for his father to awaken after mask was used    Fatty liver 06/01/2017   [place] 05/01/2017   GERD (gastroesophageal reflux disease)    has been having acid reflux since

## 2023-02-25 NOTE — Progress Notes (Signed)
@Patient  ID: [name], male    DOB: [birth year], 67 y.o.   MRN: [id number]  Chief Complaint  Patient presents with   Follow-up    SOB persistent.  Review PFT from this morning.    Referring provider: Ardith Dark, MD  HPI:   67 y.o. man with history of CAD, history of COVID infection in the past, presents for evaluation of dyspnea on exertion.    Returns for evaluation of dyspnea.  Historically not improved with Breztri.  Repeat PFTs today.  In the past suggestive of extrathoracic restriction likely obesity and left hemidiaphragm paralysis.  PFTs today borderline but no significant bronchodilator response.  Severely reduced TLC with severe reduced ERV, normal DLCO.  Again suggestive of extraparenchymal restriction.  Reviewed prior cross-sectional imaging 01/2022 in detail, no sign of ILD, does show atelectasis as expected given description above.  HPI at initial visit: Patient with long history of dyspnea on exertion.  Worse in the couple years ago.  Eventually was found to have coronary disease.  Had stent placed.  This improved for a bit.  But still with residual shortness of breath over the last year to 15 months.  Sometimes get sweaty, breaks out in sweat, she is very short of breath.  Walking long distances.  Or any level of exertion up stairs or carrying things.  Admittedly he is less active over time.  Does have some musculoskeletal issues, arthritis etc. that limits his activity due to pain etc.  Reviewed serial chest images, most recently CT scan 01/2022 are clear without any signs of ILD or otherwise.  He had a PE in the past but completed Xarelto and had subsequent VQ scan that was negative.  Reviewed serial chest x-rays most recently 2023 but dating back to at least 2018 and on my review interpretation demonstrate clear lungs with elevated left hemidiaphragm suspicious for diaphragmatic dysfunction/paralyzation.  He has not improved with inhalers in the past.  Tried Advair  HFA and Diskus, Trelegy.  Discussed trial of Breztri to see if HFA and triple inhaled therapy would be more effective.  Discussed that to date we have no evidence that lungs are contributing to her shortness of breath.  Reviewed PFTs 2022 that are all normal, did show severely reduced ERV and borderline TLC suspicious for contribution of borderline extrathoracic restriction related to habitus versus diaphragmatic paralyzation as discussed above.  Questionaires / Pulmonary Flowsheets:   ACT:      No data to display          MMRC: mMRC Dyspnea Scale mMRC Score  07/27/2021  2:43 PM 3    Epworth:     01/13/2014    9:54 AM  Results of the Epworth flowsheet  Sitting and reading 2  Watching TV 3  Sitting, inactive in a public place (e.g. a theatre or a meeting) 1  As a passenger in a car for an hour without a break 2  Lying down to rest in the afternoon when circumstances permit 2  Sitting and talking to someone 1  Sitting quietly after a lunch without alcohol 0  In a car, while stopped for a few minutes in traffic 0  Total score 11    Tests:   FENO:  No results found for: "NITRICOXIDE"  PFT:    Latest Ref Rng & Units 02/25/2023    8:52 AM 02/15/2021   11:37 AM  PFT Results  FVC-Pre L 2.66  P 2.98   FVC-Predicted Pre % 54  P 60   FVC-Post L 2.92  P 3.33   FVC-Predicted Post % 59  P 67   Pre FEV1/FVC % % 79  P 84   Post FEV1/FCV % % 81  P 84   FEV1-Pre L 2.12  P 2.49   FEV1-Predicted Pre % 58  P 67   FEV1-Post L 2.36  P 2.80   DLCO uncorrected ml/min/mmHg 22.50  P 30.12   DLCO UNC% % 80  P 105   DLCO corrected ml/min/mmHg 22.50  P   DLCO COR %Predicted % 80  P   DLVA Predicted % 111  P 143   TLC L 3.67  P 5.84   TLC % Predicted % 49  P 78   RV % Predicted % -2  P 109     P Preliminary result  Personally reviewed and interpreted as spirometry suggestive of moderate restriction versus air trapping, no bronchodilator response, borderline but acceptable total lung  capacity, severely reduced ERV, severely reduced TLC, normal DLCO  WALK:     02/02/2021    3:11 PM  SIX MIN WALK  Supplimental Oxygen during Test? (L/min) No  Tech Comments: pt walked at an average pace completing all required laps having complaints of mild SOB.    Imaging: DG UGI W DOUBLE CM (HD BA)  Result Date: 02/15/2023 CLINICAL DATA:  Lap band status for potential removal. EXAM: UPPER GI SERIES WITHOUT KUB TECHNIQUE: Routine upper GI series was performed with thin density barium. FLUOROSCOPY: Radiation Exposure Index (as provided by the fluoroscopic device): 243.4 mGy Kerma COMPARISON:  None Available. FINDINGS: No mass or stricture is noted in the esophagus. Mild tertiary contractions are noted in middle and distal portion of the esophagus suggesting presbyesophagus. Mild to moderate narrowing of the gastric cardia is noted in region of lap band. Probable small hiatal hernia is noted. No other abnormality is seen in the stomach. The visualized duodenum is unremarkable. IMPRESSION: Lap band device is seen in region of the gastric cardia. There appears to be mild to moderate narrowing of the gastric lumen in this area secondary to the device, but it does not appear to result in significant obstruction of contrast when the patient is in an upright position. Electronically Signed   By: Lupita Raider M.D.   On: 02/15/2023 10:53   US Abdomen Limited RUQ (LIVER/GB)  Result Date: 02/15/2023 CLINICAL DATA:  Gallbladder polyp. EXAM: ULTRASOUND ABDOMEN LIMITED RIGHT UPPER QUADRANT COMPARISON:  May 01, 2017. FINDINGS: Gallbladder: No gallstones or wall thickening visualized. The previously noted gallbladder polyp is not seen on current exam. No sonographic Murphy sign noted by sonographer. Common bile duct: Diameter: 3 mm. Liver: Coarsened increased echotexture of the liver. No focal liver lesion identified. Portal vein is patent on color Doppler imaging with normal direction of blood flow towards  the liver. Other: None. IMPRESSION: 1. Coarsened increased echotexture of the liver which may represent fatty infiltration of the liver. 2. The previously noted gallbladder polyp is not seen on current exam. Electronically Signed   By: Sherian Rein M.D.   On: 02/15/2023 10:17   DG Foot Complete Right  Result Date: 01/30/2023 Please see detailed radiograph report in office note.  Personally reviewed and as per EMR Lab Results: Personally reviewed CBC    Component Value Date/Time   WBC 6.3 12/04/2022 1119   RBC 4.81 12/04/2022 1119   HGB 15.2 12/04/2022 1119   HGB 15.1 04/25/2022 1405   HGB 14.2 07/14/2021 1543  HCT 46.2 12/04/2022 1119   HCT 42.6 07/14/2021 1543   PLT 229.0 12/04/2022 1119   PLT 169 04/25/2022 1405   PLT 201 07/14/2021 1543   MCV 95.9 12/04/2022 1119   MCV 95 07/14/2021 1543   MCH 33.2 04/25/2022 1405   MCHC 32.9 12/04/2022 1119   RDW 14.6 12/04/2022 1119   RDW 12.4 07/14/2021 1543   LYMPHSABS 1.6 04/25/2022 1405   LYMPHSABS 1.8 08/15/2018 1001   MONOABS 0.4 04/25/2022 1405   EOSABS 0.1 04/25/2022 1405   EOSABS 0.1 08/15/2018 1001   BASOSABS 0.0 04/25/2022 1405   BASOSABS 0.0 08/15/2018 1001    BMET    Component Value Date/Time   NA 139 12/04/2022 1119   NA 140 11/15/2021 1444   K 4.3 12/04/2022 1119   CL 98 12/04/2022 1119   CO2 30 12/04/2022 1119   GLUCOSE 212 (H) 12/04/2022 1119   BUN 23 12/04/2022 1119   BUN 17 11/15/2021 1444   CREATININE 1.53 (H) 12/04/2022 1119   CREATININE 1.47 (H) 04/25/2022 1405   CREATININE 1.33 (H) 01/03/2016 1505   CALCIUM 9.2 12/04/2022 1119   GFRNONAA 52 (L) 04/25/2022 1405   GFRNONAA 60 10/21/2013 1546   GFRAA 75 08/15/2018 1001   GFRAA 70 10/21/2013 1546    BNP    Component Value Date/Time   BNP 3.6 06/30/2016 1039    ProBNP    Component Value Date/Time   PROBNP 15.0 01/24/2022 1620    Specialty Problems       Pulmonary Problems   OSA (obstructive sleep apnea)   COVID-19 long hauler  manifesting chronic dyspnea   Paraseptal emphysema (HCC)   Allergic rhinitis    Allergies  Allergen Reactions   Jardiance [Empagliflozin]     dizziness   Penicillins Nausea Only    Upset stomach    Immunization History  Administered Date(s) Administered   Influenza, High Dose Seasonal PF 04/30/2018   Influenza,inj,Quad PF,6+ Mos 06/30/2016, 03/14/2017   Moderna Sars-Covid-2 Vaccination 10/14/2020   PFIZER(Purple Top)SARS-COV-2 Vaccination 09/10/2019, 10/05/2019   Pneumococcal Polysaccharide-23 10/09/2012   Tdap 02/09/2015    Past Medical History:  Diagnosis Date   Abdominal aortic aneurysm (AAA), 30-34 mm diameter (HCC) 06/01/2017   Nml w/ greatest dm 3 cm US Aorta 06/2016, Abd [place] 05/01/2017 showed infrarenal dilatation at 3.4 cm - rec repeat US in 3 yrs.   Allergy    "pollen"   Arthritis    Cataract    removed,bilateral   Cervical disc disease 12/26/2011   Coronary artery disease involving native coronary artery of native heart without angina pectoris 12/01/2016   He had coronary angiography in 2011. There was irregularity within the LAD with up to 30% narrowing. The myocardial perfusion imaging done 11/07/2015 did not demonstrate any evidence of ischemia with a low risk nuclear stress test other than EF estimated at 47%. Chest CT 01/25/2016 showed diffuse coronary artery calcifications with heavy calcifications in the LAD. Pt seen by cardiology Dr. Mendel Ryder    Diabetes mellitus    Difficult intubation 04/05/2014   Dyslipidemia    Family history of anesthesia complication    pt states took days for his father to awaken after mask was used    Fatty liver 06/01/2017   [place] 05/01/2017   GERD (gastroesophageal reflux disease)    has been having acid reflux since recent endoscopy    H/O hiatal hernia    repair with lap band, now has ventral hernia midline abd   Hearing loss-aides 03/26/2012  Hyperlipidemia    Hypertension    Lapband APS + hiatal hernia repair Oct 2015  04/05/2014   Morbid obesity (HCC)    OSA (obstructive sleep apnea)    Rotator cuff tear    Seasonal allergies    Shortness of breath    pt states related to high BP meds with extended walking or climbling stairs   Sleep apnea     Tobacco History: Social History   Tobacco Use  Smoking Status Former   Current packs/day: 0.00   Average packs/day: 4.0 packs/day for 20.0 years (80.0 ttl pk-yrs)   Types: Cigarettes   Start date: 07/19/1973   Quit date: 12/13/1991   Years since quitting: 31.2  Smokeless Tobacco Never   Counseling given: Not Answered   Continue to not smoke  Outpatient Encounter Medications as of 02/25/2023  Medication Sig   acetaminophen (TYLENOL) 500 MG tablet Take 500 mg by mouth every 6 (six) hours as needed (for pain.).   aspirin EC 81 MG tablet Take 81 mg by mouth daily. Swallow whole.   Cholecalciferol (VITAMIN D) 50 MCG (2000 UT) tablet Take 2,000 Units by mouth daily.   diltiazem (CARDIZEM CD) 180 MG 24 hr capsule Take 1 capsule (180 mg total) by mouth daily.   diphenhydrAMINE (BENADRYL) 25 MG tablet Take 25 mg by mouth every 6 (six) hours as needed for itching or allergies.   ezetimibe (ZETIA) 10 MG tablet Take 1 tablet (10 mg total) by mouth daily.   fluticasone (FLONASE) 50 MCG/ACT nasal spray Place 1 spray into both nostrils daily as needed for allergies.   glucose blood (TRUETEST TEST) test strip Use as instructed   insulin degludec (TRESIBA FLEXTOUCH) 200 UNIT/ML FlexTouch Pen Inject  65 units twice daily (Patient taking differently: 130 Units daily. Inject  130 units once daily)   ketoconazole (NIZORAL) 2 % cream Apply 1 Application topically daily as needed for irritation.   lisinopril (ZESTRIL) 20 MG tablet TAKE ONE TABLET BY MOUTH ONE TIME DAILY   loratadine (CLARITIN) 10 MG tablet Take 10 mg by mouth daily.   mupirocin ointment (BACTROBAN) 2 % Apply 1 Application topically daily. Apply 0.5 gm daily to foot ulcer   potassium chloride SA (KLOR-CON M)  20 MEQ tablet Take 60 mEq by mouth daily.   rosuvastatin (CRESTOR) 40 MG tablet TAKE ONE TABLET BY MOUTH ONE TIME DAILY   silver sulfADIAZINE (SSD) 1 % cream Apply 1 Application topically daily.   tamsulosin (FLOMAX) 0.4 MG CAPS capsule TAKE ONE CAPSULE BY MOUTH ONCE A DAY THIRTY MINUTES AFTER THE SAME MEAL EACH DAY   torsemide (DEMADEX) 20 MG tablet Take 3 tablets (60 mg total) by mouth daily.   Budeson-Glycopyrrol-Formoterol (BREZTRI AEROSPHERE) 160-9-4.8 MCG/ACT AERO Inhale 2 puffs into the lungs in the morning and at bedtime. (Patient not taking: Reported on 02/25/2023)   doxycycline (VIBRA-TABS) 100 MG tablet Take 1 tablet (100 mg total) by mouth 2 (two) times daily. (Patient not taking: Reported on 02/25/2023)   No facility-administered encounter medications on file as of 02/25/2023.     Review of Systems  Review of Systems  N/a Physical Exam  BP 108/62 (BP Location: Left Arm, Patient Position: Sitting, Cuff Size: Large)   Pulse 83   Temp 98.1 F (36.7 C) (Oral)   Ht 6' (1.829 m)   Wt (!) 303 lb 9.6 oz (137.7 kg)   SpO2 97%   BMI 41.18 kg/m   Wt Readings from Last 5 Encounters:  02/25/23 (!) 303 lb  9.6 oz (137.7 kg)  01/22/23 (!) 304 lb 9.6 oz (138.2 kg)  01/22/23 (!) 304 lb 3.2 oz (138 kg)  12/04/22 293 lb 6.4 oz (133.1 kg)  10/16/22 297 lb 12.8 oz (135.1 kg)    BMI Readings from Last 5 Encounters:  02/25/23 41.18 kg/m  01/22/23 41.31 kg/m  01/22/23 41.26 kg/m  12/04/22 39.79 kg/m  10/16/22 40.39 kg/m     Physical Exam Sitting in chair, no acute distress Eyes: EOMI, no icterus Neck: Supple, no JVP Pulmonary: Distant, clear Cardiovascular: Warm, trace edema bilaterally Abdomen: Distended, soft, bowel sounds present MSK: No synovitis, no joint effusion Neuro: Normal gait, no weakness Psych: Normal mood, full affect   Assessment & Plan:   Dyspnea on exertion:   Serial chest images clear including chest x-ray and CT scan most recently 01/2022.  Pulmonary  function test 2022 within normal limits.  High suspicion for deconditioning and habitus contributing to his symptoms.  He does have elevation of elevated hemidiaphragm and borderline but acceptable total lung capacity, near mild restriction that is extrathoracic with low ERV which could be related to habitus as well as possible diaphragmatic injury/paralysis.  Trial of Breztri, today inhaler has not been helpful.  By enlarge bulk of symptoms felt not to be related to pulmonary issues but other causes such as CAD, he has OSA, likely OHS given chronically elevated bicarbonate.  Repeat PFTs 02/2023 consistent with x-ray, because of restriction, habitus and paralyzed diaphragm most likely culprit.  No improvement with Breztri.  Trial of Stiolto.  Referral to pulmonary rehab.  Elevated left hemidiaphragm: Presently since 2018.  Suspect related to cervical spine arthritis and subsequent nerve damage.  No further workup or evaluation recommended at this time.   Return in about 3 months (around 05/27/2023).   Karren Burly, MD 02/25/2023   This appointment required 41 minutes of patient care (this includes precharting, chart review, review of results, face-to-face care, etc.).

## 2023-02-25 NOTE — Patient Instructions (Signed)
Nice to see you  Try Stiolto 2 puffs once a day  If you want - send me a message and we can try nebulizer medicines  I sent a referral for pulmonary rehab to see if it helps some  Pulmonary function tests are reassuring - suggests that the diaphragm and maybe weight are contributing  Return to clinic in 3 months or sooner as needed

## 2023-02-25 NOTE — Progress Notes (Signed)
Full PFT performed today. °

## 2023-02-26 ENCOUNTER — Telehealth (HOSPITAL_COMMUNITY): Payer: Self-pay

## 2023-02-26 NOTE — Telephone Encounter (Signed)
Called to confirm appt. Pt confirmed appt. Instructed pt on proper footwear. Gave directions along with department number.

## 2023-02-27 ENCOUNTER — Encounter (HOSPITAL_COMMUNITY): Payer: Self-pay

## 2023-02-27 ENCOUNTER — Encounter (HOSPITAL_COMMUNITY)
Admission: RE | Admit: 2023-02-27 | Discharge: 2023-02-27 | Disposition: A | Discharge status: Home / Self Care | Payer: Medicare Other | Source: Ambulatory Visit | Attending: Pulmonary Disease | Admitting: Pulmonary Disease

## 2023-02-27 VITALS — BP 108/62 | HR 77 | Wt 303.8 lb

## 2023-02-27 DIAGNOSIS — R0609 Other forms of dyspnea: Secondary | ICD-10-CM | POA: Diagnosis present

## 2023-02-27 LAB — GLUCOSE, CAPILLARY: Glucose-Capillary: 251 mg/dL — ABNORMAL HIGH (ref 70–99)

## 2023-02-27 NOTE — Progress Notes (Signed)
Pulmonary Individual Treatment Plan  Patient Details  Name: [name] MRN: [id number] Date of Birth: [birth year] Referring Provider:   Doristine Devoid Pulmonary Rehab Walk Test from 02/27/2023 in [facility] for Heart, Vascular, & Lung Health  Referring Provider Hunsucker       Initial Encounter Date:  Flowsheet Row Pulmonary Rehab Walk Test from 02/27/2023 in [facility] for Heart, Vascular, & Lung Health  Date 02/27/23       Visit Diagnosis: Dyspnea on exertion  Patient's Home Medications on Admission:   Current Outpatient Medications:    acetaminophen (TYLENOL) 500 MG tablet, Take 500 mg by mouth every 6 (six) hours as needed (for pain.)., Disp: , Rfl:    aspirin EC 81 MG tablet, Take 81 mg by mouth daily. Swallow whole., Disp: , Rfl:    Budeson-Glycopyrrol-Formoterol (BREZTRI AEROSPHERE) 160-9-4.8 MCG/ACT AERO, Inhale 2 puffs into the lungs in the morning and at bedtime., Disp: 10.7 g, Rfl: 0   Cholecalciferol (VITAMIN D) 50 MCG (2000 UT) tablet, Take 2,000 Units by mouth daily., Disp: , Rfl:    diltiazem (CARDIZEM CD) 180 MG 24 hr capsule, Take 1 capsule (180 mg total) by mouth daily., Disp: 90 capsule, Rfl: 3   diphenhydrAMINE (BENADRYL) 25 MG tablet, Take 25 mg by mouth every 6 (six) hours as needed for itching or allergies., Disp: , Rfl:    doxycycline (VIBRA-TABS) 100 MG tablet, Take 1 tablet (100 mg total) by mouth 2 (two) times daily., Disp: 20 tablet, Rfl: 0   ezetimibe (ZETIA) 10 MG tablet, Take 1 tablet (10 mg total) by mouth daily., Disp: 90 tablet, Rfl: 3   fluticasone (FLONASE) 50 MCG/ACT nasal spray, Place 1 spray into both nostrils daily as needed for allergies., Disp: , Rfl:    glucose blood (TRUETEST TEST) test strip, Use as instructed, Disp: 100 each, Rfl: 2   insulin degludec (TRESIBA FLEXTOUCH) 200 UNIT/ML FlexTouch Pen, Inject  65 units twice daily (Patient taking differently: 140 Units daily. Inject  140  units once daily), Disp: 63 mL, Rfl: 1   ketoconazole (NIZORAL) 2 % cream, Apply 1 Application topically daily as needed for irritation., Disp: , Rfl:    lisinopril (ZESTRIL) 20 MG tablet, TAKE ONE TABLET BY MOUTH ONE TIME DAILY, Disp: 90 tablet, Rfl: 3   loratadine (CLARITIN) 10 MG tablet, Take 10 mg by mouth daily., Disp: , Rfl:    mupirocin ointment (BACTROBAN) 2 %, Apply 1 Application topically daily. Apply 0.5 gm daily to foot ulcer, Disp: 22 g, Rfl: 0   potassium chloride SA (KLOR-CON M) 20 MEQ tablet, Take 60 mEq by mouth daily., Disp: , Rfl:    rosuvastatin (CRESTOR) 40 MG tablet, TAKE ONE TABLET BY MOUTH ONE TIME DAILY, Disp: 90 tablet, Rfl: 3   silver sulfADIAZINE (SSD) 1 % cream, Apply 1 Application topically daily., Disp: , Rfl:    tamsulosin (FLOMAX) 0.4 MG CAPS capsule, TAKE ONE CAPSULE BY MOUTH ONCE A DAY THIRTY MINUTES AFTER THE SAME MEAL EACH DAY, Disp: 90 capsule, Rfl: 0   torsemide (DEMADEX) 20 MG tablet, TAKE ONE TABLET BY MOUTH DAILY AS NEEDED, Disp: 30 tablet, Rfl: 5  Past Medical History: Past Medical History:  Diagnosis Date   Abdominal aortic aneurysm (AAA), 30-34 mm diameter (HCC) 06/01/2017   Nml w/ greatest dm 3 cm US Aorta 06/2016, Abd [place] 05/01/2017 showed infrarenal dilatation at 3.4 cm - rec repeat US in 3 yrs.   Allergy    "pollen"  Arthritis    Cataract    removed,bilateral   Cervical disc disease 12/26/2011   Coronary artery disease involving native coronary artery of native heart without angina pectoris 12/01/2016   He had coronary angiography in 2011. There was irregularity within the LAD with up to 30% narrowing. The myocardial perfusion imaging done 11/07/2015 did not demonstrate any evidence of ischemia with a low risk nuclear stress test other than EF estimated at 47%. Chest CT 01/25/2016 showed diffuse coronary artery calcifications with heavy calcifications in the LAD. Pt seen by cardiology Dr. Mendel Ryder    Diabetes mellitus    Difficult intubation  04/05/2014   Dyslipidemia    Family history of anesthesia complication    pt states took days for his father to awaken after mask was used    Fatty liver 06/01/2017   [place] 05/01/2017   GERD (gastroesophageal reflux disease)    has been having acid reflux since recent endoscopy    H/O hiatal hernia    repair with lap band, now has ventral hernia midline abd   Hearing loss-aides 03/26/2012   Hyperlipidemia    Hypertension    Lapband APS + hiatal hernia repair Oct 2015 04/05/2014   Morbid obesity (HCC)    OSA (obstructive sleep apnea)    Rotator cuff tear    Seasonal allergies    Shortness of breath    pt states related to high BP meds with extended walking or climbling stairs   Sleep apnea     Tobacco Use: Social History   Tobacco Use  Smoking Status Former   Current packs/day: 0.00   Average packs/day: 4.0 packs/day for 20.0 years (80.0 ttl pk-yrs)   Types: Cigarettes   Start date: 07/19/1973   Quit date: 12/13/1991   Years since quitting: 31.2  Smokeless Tobacco Never    Labs: Review Flowsheet  More data exists      Latest Ref Rng & Units 07/18/2021 02/26/2022 07/17/2022 09/03/2022 12/04/2022  Labs for ITP Cardiac and Pulmonary Rehab  Cholestrol 0 - 200 mg/dL - - - 161  096   LDL (calc) 0 - 99 mg/dL - - - - 30   Direct LDL mg/dL - - 97  04.5  -  HDL-C >39.00 mg/dL - - - 40.98  11.91   Trlycerides 0.0 - 149.0 mg/dL - - - 478.2  956.2   Hemoglobin A1c 4.6 - 6.5 % - 7.4  - 8.4  7.8   PH, Arterial 7.350 - 7.450 7.413  - - - -  PCO2 arterial 32.0 - 48.0 mmHg 45.3  - - - -  Bicarbonate 20.0 - 28.0 mmol/L 30.3  30.4  28.9  - - - -  TCO2 22 - 32 mmol/L 32  32  30  - - - -  O2 Saturation % 73.0  75.0  94.0  - - - -    Details       Multiple values from one day are sorted in reverse-chronological order         Capillary Blood Glucose: Lab Results  Component Value Date   GLUCAP 251 (H) 02/27/2023   GLUCAP 90 09/20/2022   GLUCAP 103 (H) 09/20/2022   GLUCAP 131 (H)  12/01/2021   GLUCAP 182 (H) 12/01/2021    POCT Glucose     Row Name 02/27/23 1421             POCT Blood Glucose   Pre-Exercise 251 mg/dL  Pulmonary Assessment Scores:  Pulmonary Assessment Scores     Row Name 02/27/23 1416         ADL UCSD   ADL Phase Entry     SOB Score total 76       CAT Score   CAT Score 18       mMRC Score   mMRC Score 4             UCSD: Self-administered rating of dyspnea associated with activities of daily living (ADLs) 6-point scale (0 = "not at all" to 5 = "maximal or unable to do because of breathlessness")  Scoring Scores range from 0 to 120.  Minimally important difference is 5 units  CAT: CAT can identify the health impairment of COPD patients and is better correlated with disease progression.  CAT has a scoring range of zero to 40. The CAT score is classified into four groups of low (less than 10), medium (10 - 20), high (21-30) and very high (31-40) based on the impact level of disease on health status. A CAT score over 10 suggests significant symptoms.  A worsening CAT score could be explained by an exacerbation, poor medication adherence, poor inhaler technique, or progression of COPD or comorbid conditions.  CAT MCID is 2 points  mMRC: mMRC (Modified Medical Research Council) Dyspnea Scale is used to assess the degree of baseline functional disability in patients of respiratory disease due to dyspnea. No minimal important difference is established. A decrease in score of 1 point or greater is considered a positive change.   Pulmonary Function Assessment:  Pulmonary Function Assessment - 02/27/23 1405       Breath   Bilateral Breath Sounds Clear    Shortness of Breath Yes;Limiting activity             Exercise Target Goals: Exercise Program Goal: Individual exercise prescription set using results from initial 6 min walk test and THRR while considering  patient's activity barriers and safety.    Exercise Prescription Goal: Initial exercise prescription builds to 30-45 minutes a day of aerobic activity, 2-3 days per week.  Home exercise guidelines will be given to patient during program as part of exercise prescription that the participant will acknowledge.  Activity Barriers & Risk Stratification:  Activity Barriers & Cardiac Risk Stratification - 02/27/23 1340       Activity Barriers & Cardiac Risk Stratification   Activity Barriers Arthritis;Deconditioning;Muscular Weakness;Shortness of Breath;Balance Concerns;Neck/Spine Problems;History of Falls;Back Problems   3 herniated disc in neck and back   Cardiac Risk Stratification Moderate             6 Minute Walk:  6 Minute Walk     Row Name 02/27/23 1452         6 Minute Walk   Phase Initial     Distance 1030 feet     Walk Time 6 minutes     # of Rest Breaks 0     MPH 1.95     METS 1.73     RPE 13     Perceived Dyspnea  2.5     VO2 Peak 6.05     Symptoms Yes (comment)     Comments c/o SOB     Resting HR 77 bpm     Resting BP 108/62     Resting Oxygen Saturation  95 %     Exercise Oxygen Saturation  during 6 min walk 94 %     Max Ex. HR 101 bpm  Max Ex. BP 126/56     2 Minute Post BP 108/56       Interval HR   1 Minute HR 87     2 Minute HR 91     3 Minute HR 96     4 Minute HR 101     5 Minute HR 101     6 Minute HR 100     2 Minute Post HR 80     Interval Heart Rate? Yes       Interval Oxygen   Interval Oxygen? Yes     Baseline Oxygen Saturation % 95 %     1 Minute Oxygen Saturation % 95 %     1 Minute Liters of Oxygen 0 L     2 Minute Oxygen Saturation % 95 %     2 Minute Liters of Oxygen 0 L     3 Minute Oxygen Saturation % 93 %     3 Minute Liters of Oxygen 0 L     4 Minute Oxygen Saturation % 94 %     4 Minute Liters of Oxygen 0 L     5 Minute Oxygen Saturation % 94 %     5 Minute Liters of Oxygen 0 L     6 Minute Oxygen Saturation % 94 %     6 Minute Liters of Oxygen 0 L     2  Minute Post Oxygen Saturation % 98 %     2 Minute Post Liters of Oxygen 0 L              Oxygen Initial Assessment:  Oxygen Initial Assessment - 02/27/23 1342       Home Oxygen   Home Oxygen Device None    Sleep Oxygen Prescription CPAP    Home Exercise Oxygen Prescription None    Home Resting Oxygen Prescription None      Initial 6 min Walk   Oxygen Used None      Program Oxygen Prescription   Program Oxygen Prescription None      Intervention   Short Term Goals To learn and understand importance of maintaining oxygen saturations>88%;To learn and demonstrate proper use of respiratory medications;To learn and understand importance of monitoring SPO2 with pulse oximeter and demonstrate accurate use of the pulse oximeter.;To learn and demonstrate proper pursed lip breathing techniques or other breathing techniques.     Long  Term Goals Maintenance of O2 saturations>88%;Compliance with respiratory medication;Verbalizes importance of monitoring SPO2 with pulse oximeter and return demonstration;Exhibits proper breathing techniques, such as pursed lip breathing or other method taught during program session;Demonstrates proper use of MDI's             Oxygen Re-Evaluation:   Oxygen Discharge (Final Oxygen Re-Evaluation):   Initial Exercise Prescription:  Initial Exercise Prescription - 02/27/23 1400       Date of Initial Exercise RX and Referring Provider   Date 02/27/23    Referring Provider Hunsucker    Expected Discharge Date 05/23/23      Bike   Level 2    Minutes 15    METs 1.7      Recumbant Elliptical   Level 2    RPM 70    Minutes 15    METs 1.7      Prescription Details   Frequency (times per week) 2    Duration Progress to 30 minutes of continuous aerobic without signs/symptoms of physical distress      Intensity  THRR 40-80% of Max Heartrate 61-122    Ratings of Perceived Exertion 11-13    Perceived Dyspnea 0-4      Progression   Progression  Continue progressive overload as per policy without signs/symptoms or physical distress.      Resistance Training   Training Prescription Yes    Weight blue bands    Reps 10-15             Perform Capillary Blood Glucose checks as needed.  Exercise Prescription Changes:   Exercise Comments:   Exercise Goals and Review:   Exercise Goals     Row Name 02/27/23 1341             Exercise Goals   Increase Physical Activity Yes       Intervention Provide advice, education, support and counseling about physical activity/exercise needs.;Develop an individualized exercise prescription for aerobic and resistive training based on initial evaluation findings, risk stratification, comorbidities and participant's personal goals.       Expected Outcomes Short Term: Attend rehab on a regular basis to increase amount of physical activity.;Long Term: Exercising regularly at least 3-5 days a week.;Long Term: Add in home exercise to make exercise part of routine and to increase amount of physical activity.       Increase Strength and Stamina Yes       Intervention Provide advice, education, support and counseling about physical activity/exercise needs.;Develop an individualized exercise prescription for aerobic and resistive training based on initial evaluation findings, risk stratification, comorbidities and participant's personal goals.       Expected Outcomes Short Term: Perform resistance training exercises routinely during rehab and add in resistance training at home;Short Term: Increase workloads from initial exercise prescription for resistance, speed, and METs.;Long Term: Improve cardiorespiratory fitness, muscular endurance and strength as measured by increased METs and functional capacity ( )       Able to understand and use rate of perceived exertion (RPE) scale Yes       Intervention Provide education and explanation on how to use RPE scale       Expected Outcomes Short Term: Able  to use RPE daily in rehab to express subjective intensity level;Long Term:  Able to use RPE to guide intensity level when exercising independently       Able to understand and use Dyspnea scale Yes       Intervention Provide education and explanation on how to use Dyspnea scale       Expected Outcomes Short Term: Able to use Dyspnea scale daily in rehab to express subjective sense of shortness of breath during exertion;Long Term: Able to use Dyspnea scale to guide intensity level when exercising independently       Knowledge and understanding of Target Heart Rate Range (THRR) Yes       Intervention Provide education and explanation of THRR including how the numbers were predicted and where they are located for reference       Expected Outcomes Short Term: Able to state/look up THRR;Long Term: Able to use THRR to govern intensity when exercising independently;Short Term: Able to use daily as guideline for intensity in rehab       Understanding of Exercise Prescription Yes       Intervention Provide education, explanation, and written materials on patient's individual exercise prescription       Expected Outcomes Short Term: Able to explain program exercise prescription;Long Term: Able to explain home exercise prescription to exercise independently  Exercise Goals Re-Evaluation :   Discharge Exercise Prescription (Final Exercise Prescription Changes):   Nutrition:  Target Goals: Understanding of nutrition guidelines, daily intake of sodium 1500mg , cholesterol 200mg , calories 30% from fat and 7% or less from saturated fats, daily to have 5 or more servings of fruits and vegetables.  Biometrics:  Pre Biometrics - 02/27/23 1450       Pre Biometrics   Grip Strength 29 kg              Nutrition Therapy Plan and Nutrition Goals:   Nutrition Assessments:  MEDIFICTS Score Key: >=70 Need to make dietary changes  40-70 Heart Healthy Diet <= 40 Therapeutic Level  Cholesterol Diet  Flowsheet Row INTENSIVE CARDIAC REHAB from 11/27/2021 in [facility] for Heart, Vascular, & Lung Health  Picture Your Plate Total Score on Admission 56      Picture Your Plate Scores: <16 Unhealthy dietary pattern with much room for improvement. 41-50 Dietary pattern unlikely to meet recommendations for good health and room for improvement. 51-60 More healthful dietary pattern, with some room for improvement.  >60 Healthy dietary pattern, although there may be some specific behaviors that could be improved.    Nutrition Goals Re-Evaluation:   Nutrition Goals Discharge (Final Nutrition Goals Re-Evaluation):   Psychosocial: Target Goals: Acknowledge presence or absence of significant depression and/or stress, maximize coping skills, provide positive support system. Participant is able to verbalize types and ability to use techniques and skills needed for reducing stress and depression.  Initial Review & Psychosocial Screening:  Initial Psych Review & Screening - 02/27/23 1331       Initial Review   Current issues with Current Stress Concerns    Source of Stress Concerns Unable to perform yard/household activities;Unable to participate in former interests or hobbies      Family Dynamics   Good Support System? Yes      Barriers   Psychosocial barriers to participate in program There are no identifiable barriers or psychosocial needs.      Screening Interventions   Interventions Encouraged to exercise    Expected Outcomes Short Term goal: Utilizing psychosocial counselor, staff and physician to assist with identification of specific Stressors or current issues interfering with healing process. Setting desired goal for each stressor or current issue identified.;Short Term goal: Identification and review with participant of any Quality of Life or Depression concerns found by scoring the questionnaire.;Long Term Goal: Stressors or current issues  are controlled or eliminated.;Long Term goal: The participant improves quality of Life and PHQ9 Scores as seen by post scores and/or verbalization of changes             Quality of Life Scores:  Scores of 19 and below usually indicate a poorer quality of life in these areas.  A difference of  2-3 points is a clinically meaningful difference.  A difference of 2-3 points in the total score of the Quality of Life Index has been associated with significant improvement in overall quality of life, self-image, physical symptoms, and general health in studies assessing change in quality of life.  PHQ-9: Review Flowsheet  More data exists      02/27/2023 01/22/2023 12/04/2022 09/03/2022 05/28/2022  Depression screen PHQ 2/9  Decreased Interest 0 0 0 0 0  Down, Depressed, Hopeless 0 0 0 0 0  PHQ - 2 Score 0 0 0 0 0  Altered sleeping 1 - - - -  Tired, decreased energy 0 - - - -  Change in appetite 1 - - - -  Feeling bad or failure about yourself  1 - - - -  Trouble concentrating 0 - - - -  Moving slowly or fidgety/restless 0 - - - -  Suicidal thoughts 0 - - - -  PHQ-9 Score 3 - - - -  Difficult doing work/chores Somewhat difficult - - - -    Details           Interpretation of Total Score  Total Score Depression Severity:  1-4 = Minimal depression, 5-9 = Mild depression, 10-14 = Moderate depression, 15-19 = Moderately severe depression, 20-27 = Severe depression   Psychosocial Evaluation and Intervention:  Psychosocial Evaluation - 02/27/23 1333       Psychosocial Evaluation & Interventions   Interventions Encouraged to exercise with the program and follow exercise prescription;Stress management education    Comments Cid is dealing with stress from his health and not being able to do the activities he used to do.    Expected Outcomes For Cheikh to participate in PR free of any psychosocial barriers or concerns.    Continue Psychosocial Services  No Follow up required              Psychosocial Re-Evaluation:   Psychosocial Discharge (Final Psychosocial Re-Evaluation):   Education: Education Goals: Education classes will be provided on a weekly basis, covering required topics. Participant will state understanding/return demonstration of topics presented.  Learning Barriers/Preferences:  Learning Barriers/Preferences - 02/27/23 1333       Learning Barriers/Preferences   Learning Barriers Sight;Hearing    Learning Preferences Group Instruction;Skilled Demonstration;Individual Instruction;Written Material             Education Topics: Know Your Numbers Group instruction that is supported by a PowerPoint presentation. Instructor discusses importance of knowing and understanding resting, exercise, and post-exercise oxygen saturation, heart rate, and blood pressure. Oxygen saturation, heart rate, blood pressure, rating of perceived exertion, and dyspnea are reviewed along with a normal range for these values.    Exercise for the Pulmonary Patient Group instruction that is supported by a PowerPoint presentation. Instructor discusses benefits of exercise, core components of exercise, frequency, duration, and intensity of an exercise routine, importance of utilizing pulse oximetry during exercise, safety while exercising, and options of places to exercise outside of rehab.    MET Level  Group instruction provided by PowerPoint, verbal discussion, and written material to support subject matter. Instructor reviews what METs are and how to increase METs.    Pulmonary Medications Verbally interactive group education provided by instructor with focus on inhaled medications and proper administration.   Anatomy and Physiology of the Respiratory System Group instruction provided by PowerPoint, verbal discussion, and written material to support subject matter. Instructor reviews respiratory cycle and anatomical components of the respiratory system and their  functions. Instructor also reviews differences in obstructive and restrictive respiratory diseases with examples of each.    Oxygen Safety Group instruction provided by PowerPoint, verbal discussion, and written material to support subject matter. There is an overview of "What is Oxygen" and "Why do we need it".  Instructor also reviews how to create a safe environment for oxygen use, the importance of using oxygen as prescribed, and the risks of noncompliance. There is a brief discussion on traveling with oxygen and resources the patient may utilize.   Oxygen Use Group instruction provided by PowerPoint, verbal discussion, and written material to discuss how supplemental oxygen is prescribed and different types of oxygen supply  systems. Resources for more information are provided.    Breathing Techniques Group instruction that is supported by demonstration and informational handouts. Instructor discusses the benefits of pursed lip and diaphragmatic breathing and detailed demonstration on how to perform both.     Risk Factor Reduction Group instruction that is supported by a PowerPoint presentation. Instructor discusses the definition of a risk factor, different risk factors for pulmonary disease, and how the heart and lungs work together.   Pulmonary Diseases Group instruction provided by PowerPoint, verbal discussion, and written material to support subject matter. Instructor gives an overview of the different type of pulmonary diseases. There is also a discussion on risk factors and symptoms as well as ways to manage the diseases.   Stress and Energy Conservation Group instruction provided by PowerPoint, verbal discussion, and written material to support subject matter. Instructor gives an overview of stress and the impact it can have on the body. Instructor also reviews ways to reduce stress. There is also a discussion on energy conservation and ways to conserve energy throughout the  day.   Warning Signs and Symptoms Group instruction provided by PowerPoint, verbal discussion, and written material to support subject matter. Instructor reviews warning signs and symptoms of stroke, heart attack, cold and flu. Instructor also reviews ways to prevent the spread of infection.   Other Education Group or individual verbal, written, or video instructions that support the educational goals of the pulmonary rehab program.    Knowledge Questionnaire Score:  Knowledge Questionnaire Score - 02/27/23 1420       Knowledge Questionnaire Score   Pre Score 14/18             Core Components/Risk Factors/Patient Goals at Admission:  Personal Goals and Risk Factors at Admission - 02/27/23 1335       Core Components/Risk Factors/Patient Goals on Admission    Weight Management Weight Loss;Yes    Intervention Weight Management: Develop a combined nutrition and exercise program designed to reach desired caloric intake, while maintaining appropriate intake of nutrient and fiber, sodium and fats, and appropriate energy expenditure required for the weight goal.;Weight Management: Provide education and appropriate resources to help participant work on and attain dietary goals.;Weight Management/Obesity: Establish reasonable short term and long term weight goals.;Obesity: Provide education and appropriate resources to help participant work on and attain dietary goals.    Expected Outcomes Short Term: Continue to assess and modify interventions until short term weight is achieved;Long Term: Adherence to nutrition and physical activity/exercise program aimed toward attainment of established weight goal;Weight Loss: Understanding of general recommendations for a balanced deficit meal plan, which promotes 1-2 lb weight loss per week and includes a negative energy balance of [phone] kcal/d;Understanding recommendations for meals to include 15-35% energy as protein, 25-35% energy from fat, 35-60%  energy from carbohydrates, less than 200mg  of dietary cholesterol, 20-35 gm of total fiber daily;Understanding of distribution of calorie intake throughout the day with the consumption of 4-5 meals/snacks    Improve shortness of breath with ADL's Yes    Intervention Provide education, individualized exercise plan and daily activity instruction to help decrease symptoms of SOB with activities of daily living.    Expected Outcomes Short Term: Improve cardiorespiratory fitness to achieve a reduction of symptoms when performing ADLs;Long Term: Be able to perform more ADLs without symptoms or delay the onset of symptoms    Intervention Provide education about signs/symptoms and action to take for hypo/hyperglycemia.;Provide education about proper nutrition, including hydration, and aerobic/resistive exercise prescription  along with prescribed medications to achieve blood glucose in normal ranges: Fasting glucose 65-99 mg/dL    Expected Outcomes Short Term: Participant verbalizes understanding of the signs/symptoms and immediate care of hyper/hypoglycemia, proper foot care and importance of medication, aerobic/resistive exercise and nutrition plan for blood glucose control.;Long Term: Attainment of HbA1C < 7%.             Core Components/Risk Factors/Patient Goals Review:    Core Components/Risk Factors/Patient Goals at Discharge (Final Review):    ITP Comments:   Comments: Dr. Mechele Collin is Medical Director for Pulmonary Rehab at [facility].

## 2023-02-27 NOTE — Progress Notes (Signed)
Pulmonary Rehab Orientation Physical Assessment Note  Physical assessment reveals  Pt is alert and oriented x 3.  Heart rate is normal.  Lung assessment completed.  Reports non-productive cough. Bowel sounds present  Pt denies/endorses abdominal discomfort, nausea, vomiting.  Occasional diarrhea of unknown origin. Grip strength equal, strong. Distal pulses palpable; moderate swelling to lower extremities ankles to mid shin.  Pt on diuretic therapy and aware he should wear compression stockings, elevate his feet and avoid sodium. Alanson Aly, BSN Cardiac and Emergency planning/management officer

## 2023-02-27 NOTE — Progress Notes (Signed)
[name] 67 y.o. male Pulmonary Rehab Orientation Note This patient who was referred to Pulmonary Rehab by Dr. Judeth Horn with the diagnosis of dyspnea on exertion arrived today in Cardiac and Pulmonary Rehab. He arrived ambulatory with normal gait. He does not carry portable oxygen. Per patient, [name] uses oxygen never. Color good, skin warm and dry. Patient is oriented to time and place. Patient's medical history, psychosocial health, and medications reviewed. Psychosocial assessment reveals patient lives with spouse. [name] is currently retired. Patient hobbies include spending time with others and traveling . Patient reports his stress level is low. Areas of stress/anxiety include health. Patient does not exhibit signs of depression. PHQ2/9 score 0/3. [name] shows good  coping skills with positive outlook on life. Offered emotional support and reassurance. Will continue to monitor. Physical assessment performed by Nurse pick: [name] Lineman RN. Please see their orientation physical assessment note. [name] reports he  does take medications as prescribed. Patient states he  follows a regular  diet. The patient reports no specific efforts to gain or lose weight.. Patient's weight will be monitored closely. Demonstration and practice of PLB using pulse oximeter. [name] able to return demonstration satisfactorily. Safety and hand hygiene in the exercise area reviewed with patient. [name] voices understanding of the information reviewed. Department expectations discussed with patient and achievable goals were set. The patient shows enthusiasm about attending the program and we look forward to working with [name]. [name] completed a 6 min walk test today and is scheduled to begin exercise on 9/17.   1250-1430 [name], BSRT

## 2023-02-28 ENCOUNTER — Encounter: Payer: Self-pay | Admitting: Family Medicine

## 2023-02-28 NOTE — Telephone Encounter (Signed)
Please advise 

## 2023-03-01 NOTE — Telephone Encounter (Signed)
We referred him back in march for his BPH. They may have just now had an opportunity to have him scheduled.  [name]. [name], MD 03/01/2023 10:10 AM

## 2023-03-04 ENCOUNTER — Encounter: Payer: Self-pay | Admitting: Internal Medicine

## 2023-03-04 ENCOUNTER — Ambulatory Visit: Payer: Medicare Other | Attending: Internal Medicine | Admitting: Internal Medicine

## 2023-03-04 VITALS — BP 90/60 | HR 78 | Ht 72.0 in | Wt 305.4 lb

## 2023-03-04 DIAGNOSIS — I152 Hypertension secondary to endocrine disorders: Secondary | ICD-10-CM | POA: Diagnosis present

## 2023-03-04 DIAGNOSIS — J438 Other emphysema: Secondary | ICD-10-CM | POA: Insufficient documentation

## 2023-03-04 DIAGNOSIS — I2510 Atherosclerotic heart disease of native coronary artery without angina pectoris: Secondary | ICD-10-CM | POA: Insufficient documentation

## 2023-03-04 DIAGNOSIS — I5032 Chronic diastolic (congestive) heart failure: Secondary | ICD-10-CM | POA: Diagnosis present

## 2023-03-04 DIAGNOSIS — I700 Atherosclerosis of aorta: Secondary | ICD-10-CM | POA: Diagnosis not present

## 2023-03-04 DIAGNOSIS — I714 Abdominal aortic aneurysm, without rupture, unspecified: Secondary | ICD-10-CM | POA: Diagnosis not present

## 2023-03-04 DIAGNOSIS — G4733 Obstructive sleep apnea (adult) (pediatric): Secondary | ICD-10-CM | POA: Insufficient documentation

## 2023-03-04 DIAGNOSIS — E1159 Type 2 diabetes mellitus with other circulatory complications: Secondary | ICD-10-CM | POA: Diagnosis not present

## 2023-03-04 MED ORDER — LISINOPRIL 10 MG PO TABS
10.0000 mg | ORAL_TABLET | Freq: Every day | ORAL | 3 refills | Status: DC
Start: 2023-03-04 — End: 2024-01-13

## 2023-03-04 NOTE — Patient Instructions (Addendum)
Medication Instructions:  Your physician has recommended you make the following change in your medication:  DECREASE: lisinopril to 10 mg once daily STOP: ezetimibe (Zetia)   *If you need a refill on your cardiac medications before your next appointment, please call your pharmacy*   Lab Work: IN 1-2 WEEKS: BMP  If you have labs (blood work) drawn today and your tests are completely normal, you will receive your results only by: MyChart Message (if you have MyChart) OR A paper copy in the mail If you have any lab test that is abnormal or we need to change your treatment, we will call you to review the results.   Testing/Procedures: NONE   Follow-Up: At [facility], you and your health needs are our priority.  As part of our continuing mission to provide you with exceptional heart care, we have created designated Provider Care Teams.  These Care Teams include your primary Cardiologist (physician) and Advanced Practice Providers (APPs -  Physician Assistants and Nurse Practitioners) who all work together to provide you with the care you need, when you need it.   Your next appointment:   6 month(s)  Provider:   Christell Constant, MD

## 2023-03-05 ENCOUNTER — Encounter (HOSPITAL_COMMUNITY)
Admission: RE | Admit: 2023-03-05 | Discharge: 2023-03-05 | Disposition: A | Discharge status: Home / Self Care | Payer: Medicare Other | Source: Ambulatory Visit | Attending: Pulmonary Disease | Admitting: Pulmonary Disease

## 2023-03-05 ENCOUNTER — Telehealth: Payer: Self-pay

## 2023-03-05 VITALS — Wt 306.2 lb

## 2023-03-05 DIAGNOSIS — R0609 Other forms of dyspnea: Secondary | ICD-10-CM

## 2023-03-05 LAB — GLUCOSE, CAPILLARY
Glucose-Capillary: 271 mg/dL — ABNORMAL HIGH (ref 70–99)
Glucose-Capillary: 173 mg/dL — ABNORMAL HIGH (ref 70–99)

## 2023-03-05 NOTE — Telephone Encounter (Signed)
Patient Name: [name]  DOB: 1956/04/25 MRN: [id number]  Primary Cardiologist: Christell Constant, MD  Chart reviewed as part of pre-operative protocol coverage. Pre-op clearance already addressed by colleagues in earlier phone notes. To summarize recommendations:  This is the second best he has been in the years I have seen him.  He needs to stop drinking prior to surgery.  But I predict he will do well. -Dr. Izora Ribas   No further cardiac testing indicated at this time.   On ASA for mild non-obstructive CAD. Can hold for 5-7 days and resume when medically safe to do so.  Will route this bundled recommendation to requesting provider via Epic fax function and remove from pre-op pool. Please call with questions.  Sharlene Dory, PA-C 03/05/2023, 4:59 PM

## 2023-03-05 NOTE — Progress Notes (Signed)
Daily Session Note  Patient Details  Name: [name] MRN: [id number] Date of Birth: 10/23/1955 Referring Provider:   Doristine Devoid Pulmonary Rehab Walk Test from 02/27/2023 in [facility]  Referring Provider [place]       Encounter Date: 03/05/2023  Check In:  Session Check In - 03/05/23 1026       Check-In   Supervising physician immediately available to respond to emergencies [facility] MD immediately available    Physician(s) Jari Favre, NP    Location [facility]    Staff Present Durel Salts, RT;Randi Idelle Crouch BS, ACSM-CEP, Exercise Physiologist;Samantha Belarus, RD, Dutch Gray, RN, Doris Cheadle, MS, ACSM-CEP, Exercise Physiologist;David Manus Gunning, MS, ACSM-CEP, CCRP, Exercise Physiologist    Virtual Visit No    Medication changes reported     No    Fall or balance concerns reported    No    Tobacco Cessation No Change    Warm-up and Cool-down Performed as group-led instruction    Resistance Training Performed Yes    VAD Patient? No    PAD/SET Patient? No      Pain Assessment   Currently in Pain? No/denies    Multiple Pain Sites No             Capillary Blood Glucose: Results for orders placed or performed during the hospital encounter of 02/27/23 (from the past 24 hour(s))  Glucose, capillary     Status: Abnormal   Collection Time: 03/05/23 10:17 AM  Result Value Ref Range   Glucose-Capillary 271 (H) 70 - 99 mg/dL  Glucose, capillary     Status: Abnormal   Collection Time: 03/05/23 11:24 AM  Result Value Ref Range   Glucose-Capillary 173 (H) 70 - 99 mg/dL     Exercise Prescription Changes - 03/05/23 1200       Response to Exercise   Blood Pressure (Admit) 100/58    Blood Pressure (Exercise) 120/76    Blood Pressure (Exit) 100/62    Heart Rate (Admit) 81 bpm    Heart Rate (Exercise) 89 bpm    Heart Rate (Exit) 80 bpm    Oxygen Saturation (Admit) 96 %    Oxygen  Saturation (Exercise) 94 %    Oxygen Saturation (Exit) 95 %    Rating of Perceived Exertion (Exercise) 11    Perceived Dyspnea (Exercise) 1    Duration Continue with 30 min of aerobic exercise without signs/symptoms of physical distress.    Intensity THRR unchanged      Progression   Progression Continue to progress workloads to maintain intensity without signs/symptoms of physical distress.      Resistance Training   Training Prescription Yes    Weight blue bands    Reps 10-15    Time 10 Minutes      Bike   Level 3    Minutes 15    METs 2.1      Recumbant Elliptical   Level 2    Minutes 15    METs 1             Social History   Tobacco Use  Smoking Status Former   Current packs/day: 0.00   Average packs/day: 4.0 packs/day for 20.0 years (80.0 ttl pk-yrs)   Types: Cigarettes   Start date: 07/19/1973   Quit date: 12/13/1991   Years since quitting: 31.2  Smokeless Tobacco Never    Goals Met:  Proper associated with RPD/PD & O2 Sat  Exercise tolerated well No report of concerns or symptoms today Strength training completed today  Goals Unmet:  Not Applicable  Comments: Service time is from 1015 to 1140.    Dr. Mechele Collin is Medical Director for Pulmonary Rehab at [facility].

## 2023-03-05 NOTE — Telephone Encounter (Signed)
Pre-operative Risk Assessment    Patient Name: [name]  DOB: 28-Jan-1956 MRN: [id number]      Request for Surgical Clearance    Procedure:   Laparoscopic removal of Lap-band Surgery  Date of Surgery:  Clearance TBD                                 Surgeon:  Phylliss Blakes, MD Surgeon's Group or Practice Name:  [facility] Phone number:  [phone] Fax number:  [phone]   Type of Clearance Requested:   - Medical  - Pharmacy:  Hold Aspirin pt will need instructions on when/if to hold   Type of Anesthesia:  General    Additional requests/questions:    SignedZada Finders   03/05/2023, 11:06 AM

## 2023-03-06 ENCOUNTER — Encounter: Payer: Self-pay | Admitting: Family Medicine

## 2023-03-06 ENCOUNTER — Encounter: Payer: Self-pay | Admitting: Internal Medicine

## 2023-03-06 ENCOUNTER — Encounter: Payer: Self-pay | Admitting: Pulmonary Disease

## 2023-03-06 ENCOUNTER — Ambulatory Visit (INDEPENDENT_AMBULATORY_CARE_PROVIDER_SITE_OTHER): Payer: Medicare Other | Admitting: Family Medicine

## 2023-03-06 VITALS — BP 112/62 | HR 70 | Temp 98.2°F | Ht 72.0 in | Wt 306.0 lb

## 2023-03-06 DIAGNOSIS — I152 Hypertension secondary to endocrine disorders: Secondary | ICD-10-CM | POA: Diagnosis not present

## 2023-03-06 DIAGNOSIS — E1165 Type 2 diabetes mellitus with hyperglycemia: Secondary | ICD-10-CM

## 2023-03-06 DIAGNOSIS — E1159 Type 2 diabetes mellitus with other circulatory complications: Secondary | ICD-10-CM

## 2023-03-06 DIAGNOSIS — Z7985 Long-term (current) use of injectable non-insulin antidiabetic drugs: Secondary | ICD-10-CM | POA: Diagnosis not present

## 2023-03-06 DIAGNOSIS — M79673 Pain in unspecified foot: Secondary | ICD-10-CM

## 2023-03-06 DIAGNOSIS — J438 Other emphysema: Secondary | ICD-10-CM

## 2023-03-06 LAB — POCT GLYCOSYLATED HEMOGLOBIN (HGB A1C): Hemoglobin A1C: 9 % — AB (ref 4.0–5.6)

## 2023-03-06 MED ORDER — TIRZEPATIDE 2.5 MG/0.5ML ~~LOC~~ SOAJ: 2.5000 mg | SUBCUTANEOUS | 0 refills | Status: DC

## 2023-03-06 NOTE — Assessment & Plan Note (Signed)
Blood pressure at goal today on diltiazem 180 mg daily and lisinopril 10 mg daily.

## 2023-03-06 NOTE — Progress Notes (Signed)
   [name] is a 67 y.o. male who presents today for an office visit.  Assessment/Plan:  New/Acute Problems: Foot Pain No red flags.  Potentially related to exercise machine that he was using yesterday.  Does not currently have any symptoms.  Given the symptoms have resolved do not need do any further workup at this point.  He will let us know if symptoms return.  We discussed importance of good glycemic control.  Chronic Problems Addressed Today: T2DM (type 2 diabetes mellitus) (HCC) A1c not controlled at 9.0.  He is working on increasing his insulin and is currently on 140 units daily.  He will continue to increase at home as our discussion previously with goal of getting fasting sugars in the low 100s.  We again discussed GLP agonist today.  He does note he has been on the past but does not remember why they were discontinued.  He is open to trying one again.  We will start Mounjaro 2.5 mg weekly.  We can increase the dose as tolerated in a few weeks if he is doing well.  He will follow-up in a few weeks via MyChart we can adjust as needed.  Recheck A1c in 3 months.    Hypertension associated with diabetes (HCC) Blood pressure at goal today on diltiazem 180 mg daily and lisinopril 10 mg daily.     Subjective:  HPI:  See A/P for status of chronic conditions.  Patient is here today for follow-up.  We last saw him about a month ago.  At that time, we had extensive discussion regarding treatment for his diabetes.  We continued treatment with insulin alone at that point.  He was on 130 units daily at that time and he was instructed to titrate as needed with a goal of getting his fasting sugar into the low 100s.  He is now up to 140 units of insulin.  Fasting sugars have been in the 150s to 200s.  He is working on increasing his insulin.  He has not had any symptomatic lows recently.  He has also had some issues with shallow breathing and difficulty breathing.  Has been following  with cardiology and pulmonology for this.  He did have PFTs performed about a week and a half ago.  This was reassuring with mild restrictive pattern.  He is following with pulmonology.  Recently started cardiopulmonary rehab.   He has also been having ongoing issues with toe pain for the last day. Located in both feet. He thinks it was due to a machine he was working on at rehab.  Pain has subsided today.  No rashes.       Objective:  Physical Exam: BP 112/62   Pulse 70   Temp 98.2 F (36.8 C) (Temporal)   Ht 6' (1.829 m)   Wt (!) 306 lb (138.8 kg)   SpO2 95%   BMI 41.50 kg/m   Gen: No acute distress, resting comfortably CV: Regular rate and rhythm with no murmurs appreciated Pulm: Normal work of breathing, clear to auscultation bilaterally with no crackles, wheezes, or rhonchi MUSCULOSKELETAL: Bilateral feet with healing diabetic ulcers.  No rash.  Neuro vas intact distally.  No pain. Neuro: Grossly normal, moves all extremities Psych: Normal affect and thought content      [name] M. [name], MD 03/06/2023 10:24 AM

## 2023-03-06 NOTE — Patient Instructions (Addendum)
It was very nice to see you today!  We need to work on lowering your A1c.  Please continue to increase your insulin until your fasting sugars are in the low 100s.  Will see back in 3 months to recheck your A1c.  We will try [facility] for a month.  I will send this to pharmacy.  Please let me know how you are doing with this in a few weeks.  Return in about 3 months (around 06/05/2023) for Follow Up.   Take care, Dr [name]  PLEASE NOTE:  If you had any lab tests, please let us know if you have not heard back within a few days. You may see your results on mychart before we have a chance to review them but we will give you a call once they are reviewed by [place].   If we ordered any referrals today, please let us know if you have not heard from their office within the next week.   If you had any urgent prescriptions sent in today, please check with the pharmacy within an hour of our visit to make sure the prescription was transmitted appropriately.   Please try these tips to maintain a healthy lifestyle:  Eat at least 3 REAL meals and 1-2 snacks per day.  Aim for no more than 5 hours between eating.  If you eat breakfast, please do so within one hour of getting up.   Each meal should contain half fruits/vegetables, one quarter protein, and one quarter carbs (no bigger than a computer mouse)  Cut down on sweet beverages. This includes juice, soda, and sweet tea.   Drink at least 1 glass of water with each meal and aim for at least 8 glasses per day  Exercise at least 150 minutes every week.

## 2023-03-06 NOTE — Assessment & Plan Note (Signed)
A1c not controlled at 9.0.  He is working on increasing his insulin and is currently on 140 units daily.  He will continue to increase at home as our discussion previously with goal of getting fasting sugars in the low 100s.  We again discussed GLP agonist today.  He does note he has been on the past but does not remember why they were discontinued.  He is open to trying one again.  We will start Mounjaro 2.5 mg weekly.  We can increase the dose as tolerated in a few weeks if he is doing well.  He will follow-up in a few weeks via MyChart we can adjust as needed.  Recheck A1c in 3 months.

## 2023-03-07 ENCOUNTER — Encounter (HOSPITAL_COMMUNITY)
Admission: RE | Admit: 2023-03-07 | Discharge: 2023-03-07 | Disposition: A | Discharge status: Home / Self Care | Payer: Medicare Other | Source: Ambulatory Visit | Attending: Pulmonary Disease | Admitting: Pulmonary Disease

## 2023-03-07 DIAGNOSIS — R0609 Other forms of dyspnea: Secondary | ICD-10-CM

## 2023-03-07 LAB — GLUCOSE, CAPILLARY: Glucose-Capillary: 322 mg/dL — ABNORMAL HIGH (ref 70–99)

## 2023-03-07 NOTE — Telephone Encounter (Signed)
Noted. We discussed glucose management at his visit yesterday.  He should continue to increase his tresiba daily until his fasting sugars are close to 100.  We are also starting [facility] which should help with this.

## 2023-03-07 NOTE — Progress Notes (Signed)
Incomplete Pulmonary Rehab Session Note  Patient Details  Name: [name] MRN: [id number] Date of Birth: [birth year] Referring Provider:   Doristine Devoid Pulmonary Rehab Walk Test from 02/27/2023 in [facility]  Referring Provider [place]       [name] did not complete his rehab session.  [name] was sent home due to elevated BS of 322. Per policy, BS needs to be <300. Educated pt, pt stated he ate a bowl of cereal this AM. Stated he is working with his MD for better glucose control.

## 2023-03-08 MED ORDER — ARFORMOTEROL TARTRATE 15 MCG/2ML IN NEBU: 15.0000 ug | INHALATION_SOLUTION | Freq: Two times a day (BID) | RESPIRATORY_TRACT | 3 refills | Status: DC

## 2023-03-08 MED ORDER — YUPELRI 175 MCG/3ML IN SOLN: 175.0000 ug | Freq: Every day | RESPIRATORY_TRACT | 3 refills | Status: DC

## 2023-03-08 NOTE — Telephone Encounter (Signed)
Please place new DME order for nebulizer and all supplies.  I will send new prescription for arformoterol/Yupelri for dual bronchodilator therapy.  Has not improved with inhalers today.

## 2023-03-09 NOTE — Telephone Encounter (Signed)
Order for nebulizer machine has been placed.

## 2023-03-11 ENCOUNTER — Other Ambulatory Visit (HOSPITAL_COMMUNITY): Payer: Self-pay

## 2023-03-11 LAB — HM DIABETES EYE EXAM

## 2023-03-12 ENCOUNTER — Telehealth (HOSPITAL_COMMUNITY): Payer: Self-pay | Admitting: *Deleted

## 2023-03-12 ENCOUNTER — Encounter (HOSPITAL_COMMUNITY): Payer: Medicare Other

## 2023-03-12 NOTE — Telephone Encounter (Signed)
[name] left message on department voicemail stating he will be absent from pulmonary rehab on 9/24, 9/26, 10/1, and 10/3. He plans to be here on 03/26/23. Appointments cancelled. Will forward message to pulmonary rehab staff.

## 2023-03-14 ENCOUNTER — Encounter (HOSPITAL_COMMUNITY): Payer: Medicare Other

## 2023-03-19 ENCOUNTER — Encounter (HOSPITAL_COMMUNITY): Payer: Medicare Other

## 2023-03-19 NOTE — Telephone Encounter (Signed)
Can you please provide assistance with questions regarding cost of nebulized medicines?

## 2023-03-20 NOTE — Progress Notes (Signed)
Pulmonary Individual Treatment Plan  Patient Details  Name: [name] MRN: [id number] Date of Birth: 02/07/1956 Referring Provider:   Doristine Devoid Pulmonary Rehab Walk Test from 02/27/2023 in [facility]  Referring Provider Hunsucker       Initial Encounter Date:  Flowsheet Row Pulmonary Rehab Walk Test from 02/27/2023 in [facility]  Date 02/27/23       Visit Diagnosis: Dyspnea on exertion  Patient's Home Medications on Admission:   Current Outpatient Medications:    acetaminophen (TYLENOL) 500 MG tablet, Take 500 mg by mouth every 6 (six) hours as needed (for pain.)., Disp: , Rfl:    arformoterol (BROVANA) 15 MCG/2ML NEBU, Take 2 mLs (15 mcg total) by nebulization 2 (two) times daily., Disp: 120 mL, Rfl: 3   aspirin EC 81 MG tablet, Take 81 mg by mouth daily. Swallow whole., Disp: , Rfl:    Cholecalciferol (VITAMIN D) 50 MCG (2000 UT) tablet, Take 2,000 Units by mouth daily., Disp: , Rfl:    diltiazem (CARDIZEM CD) 180 MG 24 hr capsule, Take 1 capsule (180 mg total) by mouth daily., Disp: 90 capsule, Rfl: 3   diphenhydrAMINE (BENADRYL) 25 MG tablet, Take 25 mg by mouth every 6 (six) hours as needed for itching or allergies., Disp: , Rfl:    fluticasone (FLONASE) 50 MCG/ACT nasal spray, Place 1 spray into both nostrils daily as needed for allergies., Disp: , Rfl:    glucose blood (TRUETEST TEST) test strip, Use as instructed, Disp: 100 each, Rfl: 2   insulin degludec (TRESIBA FLEXTOUCH) 200 UNIT/ML FlexTouch Pen, Inject 140 Units into the skin daily at 6 (six) AM., Disp: , Rfl:    ketoconazole (NIZORAL) 2 % cream, Apply 1 Application topically daily as needed for irritation., Disp: , Rfl:    lisinopril (ZESTRIL) 10 MG tablet, Take 1 tablet (10 mg total) by mouth daily., Disp: 90 tablet, Rfl: 3   loratadine (CLARITIN) 10 MG tablet, Take 10 mg by mouth daily.,  Disp: , Rfl:    mupirocin ointment (BACTROBAN) 2 %, Apply 1 Application topically daily. Apply 0.5 gm daily to foot ulcer (Patient not taking: Reported on 03/06/2023), Disp: 22 g, Rfl: 0   potassium chloride SA (KLOR-CON M) 20 MEQ tablet, Take 60 mEq by mouth daily., Disp: , Rfl:    revefenacin (YUPELRI) 175 MCG/3ML nebulizer solution, Take 3 mLs (175 mcg total) by nebulization daily., Disp: 90 mL, Rfl: 3   rosuvastatin (CRESTOR) 40 MG tablet, TAKE ONE TABLET BY MOUTH ONE TIME DAILY, Disp: 90 tablet, Rfl: 3   silver sulfADIAZINE (SSD) 1 % cream, Apply 1 Application topically daily. (Patient not taking: Reported on 03/06/2023), Disp: , Rfl:    tamsulosin (FLOMAX) 0.4 MG CAPS capsule, TAKE ONE CAPSULE BY MOUTH ONCE A DAY THIRTY MINUTES AFTER THE SAME MEAL EACH DAY, Disp: 90 capsule, Rfl: 0   tirzepatide (MOUNJARO) 2.5 MG/0.5ML Pen, Inject 2.5 mg into the skin once a week., Disp: 2 mL, Rfl: 0   torsemide (DEMADEX) 20 MG tablet, Take 3 tablets by mouth daily., Disp: , Rfl:   Past Medical History: Past Medical History:  Diagnosis Date   Abdominal aortic aneurysm (AAA), 30-34 mm diameter (HCC) 06/01/2017   Nml w/ greatest dm 3 cm US Aorta 06/2016, Abd [place] 05/01/2017 showed infrarenal dilatation at 3.4 cm - rec repeat US in 3 yrs.   Allergy    "pollen"  Arthritis    Cataract    removed,bilateral   Cervical disc disease 12/26/2011   Coronary artery disease involving native coronary artery of native heart without angina pectoris 12/01/2016   He had coronary angiography in 2011. There was irregularity within the LAD with up to 30% narrowing. The myocardial perfusion imaging done 11/07/2015 did not demonstrate any evidence of ischemia with a low risk nuclear stress test other than EF estimated at 47%. Chest CT 01/25/2016 showed diffuse coronary artery calcifications with heavy calcifications in the LAD. Pt seen by cardiology Dr. Mendel Ryder    Diabetes mellitus    Difficult intubation 04/05/2014    Dyslipidemia    Family history of anesthesia complication    pt states took days for his father to awaken after mask was used    Fatty liver 06/01/2017   [place] 05/01/2017   GERD (gastroesophageal reflux disease)    has been having acid reflux since recent endoscopy    H/O hiatal hernia    repair with lap band, now has ventral hernia midline abd   Hearing loss-aides 03/26/2012   Hyperlipidemia    Hypertension    Lapband APS + hiatal hernia repair Oct 2015 04/05/2014   Morbid obesity (HCC)    OSA (obstructive sleep apnea)    Rotator cuff tear    Seasonal allergies    Shortness of breath    pt states related to high BP meds with extended walking or climbling stairs   Sleep apnea     Tobacco Use: Social History   Tobacco Use  Smoking Status Former   Current packs/day: 0.00   Average packs/day: 4.0 packs/day for 20.0 years (80.0 ttl pk-yrs)   Types: Cigarettes   Start date: 07/19/1973   Quit date: 12/13/1991   Years since quitting: 31.2  Smokeless Tobacco Never    Labs: Review Flowsheet  More data exists      Latest Ref Rng & Units 02/26/2022 07/17/2022 09/03/2022 12/04/2022 03/06/2023  Labs for ITP Cardiac and Pulmonary Rehab  Cholestrol 0 - 200 mg/dL - - 086  578  -  LDL (calc) 0 - 99 mg/dL - - - 30  -  Direct LDL mg/dL - 97  46.9  - -  HDL-C >39.00 mg/dL - - 62.95  28.41  -  Trlycerides 0.0 - 149.0 mg/dL - - 324.4  010.2  -  Hemoglobin A1c 4.0 - 5.6 % 7.4  - 8.4  7.8  9.0     Details            Capillary Blood Glucose: Lab Results  Component Value Date   GLUCAP 322 (H) 03/07/2023   GLUCAP 173 (H) 03/05/2023   GLUCAP 271 (H) 03/05/2023   GLUCAP 251 (H) 02/27/2023   GLUCAP 90 09/20/2022    POCT Glucose     Row Name 02/27/23 1421             POCT Blood Glucose   Pre-Exercise 251 mg/dL                Pulmonary Assessment Scores:  Pulmonary Assessment Scores     Row Name 02/27/23 1416         ADL UCSD   ADL Phase Entry     SOB Score total 76        CAT Score   CAT Score 18       mMRC Score   mMRC Score 4  UCSD: Self-administered rating of dyspnea associated with activities of daily living (ADLs) 6-point scale (0 = "not at all" to 5 = "maximal or unable to do because of breathlessness")  Scoring Scores range from 0 to 120.  Minimally important difference is 5 units  CAT: CAT can identify the health impairment of COPD patients and is better correlated with disease progression.  CAT has a scoring range of zero to 40. The CAT score is classified into four groups of low (less than 10), medium (10 - 20), high (21-30) and very high (31-40) based on the impact level of disease on health status. A CAT score over 10 suggests significant symptoms.  A worsening CAT score could be explained by an exacerbation, poor medication adherence, poor inhaler technique, or progression of COPD or comorbid conditions.  CAT MCID is 2 points  mMRC: mMRC (Modified Medical Research Council) Dyspnea Scale is used to assess the degree of baseline functional disability in patients of respiratory disease due to dyspnea. No minimal important difference is established. A decrease in score of 1 point or greater is considered a positive change.   Pulmonary Function Assessment:  Pulmonary Function Assessment - 02/27/23 1405       Breath   Bilateral Breath Sounds Clear    Shortness of Breath Yes;Limiting activity             Exercise Target Goals: Exercise Program Goal: Individual exercise prescription set using results from initial 6 min walk test and THRR while considering  patient's activity barriers and safety.   Exercise Prescription Goal: Initial exercise prescription builds to 30-45 minutes a day of aerobic activity, 2-3 days per week.  Home exercise guidelines will be given to patient during program as part of exercise prescription that the participant will acknowledge.  Activity Barriers & Risk Stratification:  Activity  Barriers & Cardiac Risk Stratification - 02/27/23 1340       Activity Barriers & Cardiac Risk Stratification   Activity Barriers Arthritis;Deconditioning;Muscular Weakness;Shortness of Breath;Balance Concerns;Neck/Spine Problems;History of Falls;Back Problems   3 herniated disc in neck and back   Cardiac Risk Stratification Moderate             6 Minute Walk:  6 Minute Walk     Row Name 02/27/23 1452         6 Minute Walk   Phase Initial     Distance 1030 feet     Walk Time 6 minutes     # of Rest Breaks 0     MPH 1.95     METS 1.73     RPE 13     Perceived Dyspnea  2.5     VO2 Peak 6.05     Symptoms Yes (comment)     Comments c/o SOB     Resting HR 77 bpm     Resting BP 108/62     Resting Oxygen Saturation  95 %     Exercise Oxygen Saturation  during 6 min walk 94 %     Max Ex. HR 101 bpm     Max Ex. BP 126/56     2 Minute Post BP 108/56       Interval HR   1 Minute HR 87     2 Minute HR 91     3 Minute HR 96     4 Minute HR 101     5 Minute HR 101     6 Minute HR 100     2  Minute Post HR 80     Interval Heart Rate? Yes       Interval Oxygen   Interval Oxygen? Yes     Baseline Oxygen Saturation % 95 %     1 Minute Oxygen Saturation % 95 %     1 Minute Liters of Oxygen 0 L     2 Minute Oxygen Saturation % 95 %     2 Minute Liters of Oxygen 0 L     3 Minute Oxygen Saturation % 93 %     3 Minute Liters of Oxygen 0 L     4 Minute Oxygen Saturation % 94 %     4 Minute Liters of Oxygen 0 L     5 Minute Oxygen Saturation % 94 %     5 Minute Liters of Oxygen 0 L     6 Minute Oxygen Saturation % 94 %     6 Minute Liters of Oxygen 0 L     2 Minute Post Oxygen Saturation % 98 %     2 Minute Post Liters of Oxygen 0 L              Oxygen Initial Assessment:  Oxygen Initial Assessment - 02/27/23 1342       Home Oxygen   Home Oxygen Device None    Sleep Oxygen Prescription CPAP    Home Exercise Oxygen Prescription None    Home Resting Oxygen  Prescription None      Initial 6 min Walk   Oxygen Used None      Program Oxygen Prescription   Program Oxygen Prescription None      Intervention   Short Term Goals To learn and understand importance of maintaining oxygen saturations>88%;To learn and demonstrate proper use of respiratory medications;To learn and understand importance of monitoring SPO2 with pulse oximeter and demonstrate accurate use of the pulse oximeter.;To learn and demonstrate proper pursed lip breathing techniques or other breathing techniques.     Long  Term Goals Maintenance of O2 saturations>88%;Compliance with respiratory medication;Verbalizes importance of monitoring SPO2 with pulse oximeter and return demonstration;Exhibits proper breathing techniques, such as pursed lip breathing or other method taught during program session;Demonstrates proper use of MDI's             Oxygen Re-Evaluation:  Oxygen Re-Evaluation     Row Name 03/14/23 0858             Program Oxygen Prescription   Program Oxygen Prescription None         Home Oxygen   Home Oxygen Device None       Sleep Oxygen Prescription CPAP       Home Exercise Oxygen Prescription None       Home Resting Oxygen Prescription None         Goals/Expected Outcomes   Short Term Goals To learn and understand importance of maintaining oxygen saturations>88%;To learn and demonstrate proper use of respiratory medications;To learn and understand importance of monitoring SPO2 with pulse oximeter and demonstrate accurate use of the pulse oximeter.;To learn and demonstrate proper pursed lip breathing techniques or other breathing techniques.        Long  Term Goals Maintenance of O2 saturations>88%;Compliance with respiratory medication;Verbalizes importance of monitoring SPO2 with pulse oximeter and return demonstration;Exhibits proper breathing techniques, such as pursed lip breathing or other method taught during program session;Demonstrates proper use of  MDI's       Goals/Expected Outcomes Compliance and understanding of oxygen saturation  monitoring and breathing techniques to decrease shortness of breath.                Oxygen Discharge (Final Oxygen Re-Evaluation):  Oxygen Re-Evaluation - 03/14/23 0858       Program Oxygen Prescription   Program Oxygen Prescription None      Home Oxygen   Home Oxygen Device None    Sleep Oxygen Prescription CPAP    Home Exercise Oxygen Prescription None    Home Resting Oxygen Prescription None      Goals/Expected Outcomes   Short Term Goals To learn and understand importance of maintaining oxygen saturations>88%;To learn and demonstrate proper use of respiratory medications;To learn and understand importance of monitoring SPO2 with pulse oximeter and demonstrate accurate use of the pulse oximeter.;To learn and demonstrate proper pursed lip breathing techniques or other breathing techniques.     Long  Term Goals Maintenance of O2 saturations>88%;Compliance with respiratory medication;Verbalizes importance of monitoring SPO2 with pulse oximeter and return demonstration;Exhibits proper breathing techniques, such as pursed lip breathing or other method taught during program session;Demonstrates proper use of MDI's    Goals/Expected Outcomes Compliance and understanding of oxygen saturation monitoring and breathing techniques to decrease shortness of breath.             Initial Exercise Prescription:  Initial Exercise Prescription - 02/27/23 1400       Date of Initial Exercise RX and Referring Provider   Date 02/27/23    Referring Provider Hunsucker    Expected Discharge Date 05/23/23      Bike   Level 2    Minutes 15    METs 1.7      Recumbant Elliptical   Level 2    RPM 70    Minutes 15    METs 1.7      Prescription Details   Frequency (times per week) 2    Duration Progress to 30 minutes of continuous aerobic without signs/symptoms of physical distress      Intensity   THRR  40-80% of Max Heartrate 61-122    Ratings of Perceived Exertion 11-13    Perceived Dyspnea 0-4      Progression   Progression Continue progressive overload as per policy without signs/symptoms or physical distress.      Resistance Training   Training Prescription Yes    Weight blue bands    Reps 10-15             Perform Capillary Blood Glucose checks as needed.  Exercise Prescription Changes:   Exercise Prescription Changes     Row Name 03/05/23 1200 03/19/23 1200           Response to Exercise   Blood Pressure (Admit) 100/58 100/58      Blood Pressure (Exercise) 120/76 --      Blood Pressure (Exit) 100/62 100/62      Heart Rate (Admit) 81 bpm 81 bpm      Heart Rate (Exercise) 89 bpm 89 bpm      Heart Rate (Exit) 80 bpm 80 bpm      Oxygen Saturation (Admit) 96 % 96 %      Oxygen Saturation (Exercise) 94 % 94 %      Oxygen Saturation (Exit) 95 % 95 %      Rating of Perceived Exertion (Exercise) 11 11      Perceived Dyspnea (Exercise) 1 1      Duration Continue with 30 min of aerobic exercise without signs/symptoms of physical distress.  Continue with 30 min of aerobic exercise without signs/symptoms of physical distress.      Intensity THRR unchanged THRR unchanged        Progression   Progression Continue to progress workloads to maintain intensity without signs/symptoms of physical distress. Continue to progress workloads to maintain intensity without signs/symptoms of physical distress.        Resistance Training   Training Prescription Yes Yes      Weight blue bands blue bands      Reps 10-15 10-15      Time 10 Minutes 10 Minutes        Bike   Level 3 --      Minutes 15 --      METs 2.1 --        Recumbant Elliptical   Level 2 --      Minutes 15 --      METs 1 --               Exercise Comments:   Exercise Comments     Row Name 03/05/23 1530           Exercise Comments Pt completed first day of group exercise. He exercised on the scifit  bike for 15 min, level 3, METs 2.1. He then exercised on the recumbent elliptical, level 2, METs 1.0, 15 min. He tolerated well, performed warm up and cool down without difficulty. Discussed METs.                Exercise Goals and Review:   Exercise Goals     Row Name 02/27/23 1341             Exercise Goals   Increase Physical Activity Yes       Intervention Provide advice, education, support and counseling about physical activity/exercise needs.;Develop an individualized exercise prescription for aerobic and resistive training based on initial evaluation findings, risk stratification, comorbidities and participant's personal goals.       Expected Outcomes Short Term: Attend rehab on a regular basis to increase amount of physical activity.;Long Term: Exercising regularly at least 3-5 days a week.;Long Term: Add in home exercise to make exercise part of routine and to increase amount of physical activity.       Increase Strength and Stamina Yes       Intervention Provide advice, education, support and counseling about physical activity/exercise needs.;Develop an individualized exercise prescription for aerobic and resistive training based on initial evaluation findings, risk stratification, comorbidities and participant's personal goals.       Expected Outcomes Short Term: Perform resistance training exercises routinely during rehab and add in resistance training at home;Short Term: Increase workloads from initial exercise prescription for resistance, speed, and METs.;Long Term: Improve cardiorespiratory fitness, muscular endurance and strength as measured by increased METs and functional capacity ( )       Able to understand and use rate of perceived exertion (RPE) scale Yes       Intervention Provide education and explanation on how to use RPE scale       Expected Outcomes Short Term: Able to use RPE daily in rehab to express subjective intensity level;Long Term:  Able to use RPE to  guide intensity level when exercising independently       Able to understand and use Dyspnea scale Yes       Intervention Provide education and explanation on how to use Dyspnea scale       Expected Outcomes Short Term: Able to  use Dyspnea scale daily in rehab to express subjective sense of shortness of breath during exertion;Long Term: Able to use Dyspnea scale to guide intensity level when exercising independently       Knowledge and understanding of Target Heart Rate Range (THRR) Yes       Intervention Provide education and explanation of THRR including how the numbers were predicted and where they are located for reference       Expected Outcomes Short Term: Able to state/look up THRR;Long Term: Able to use THRR to govern intensity when exercising independently;Short Term: Able to use daily as guideline for intensity in rehab       Understanding of Exercise Prescription Yes       Intervention Provide education, explanation, and written materials on patient's individual exercise prescription       Expected Outcomes Short Term: Able to explain program exercise prescription;Long Term: Able to explain home exercise prescription to exercise independently                Exercise Goals Re-Evaluation :  Exercise Goals Re-Evaluation     Row Name 03/14/23 0854             Exercise Goal Re-Evaluation   Exercise Goals Review Increase Physical Activity;Able to understand and use Dyspnea scale;Understanding of Exercise Prescription;Increase Strength and Stamina;Knowledge and understanding of Target Heart Rate Range (THRR);Able to understand and use rate of perceived exertion (RPE) scale       Comments [name] has completed 1 exercise session. He exercises for 15 min on the upright bike and recumbent elliptical. He averages 2.1 METs at level 3 on the upright bike and 1.0 MET at level 2 on the recumbent elliptical. He performs the warmup and cooldown standing holding onto a chair for balance.  [name] has stated that the recumbent elliptical bothers his toes. He has of history of diabetic neuropathy. Will transfer [facility] from the recumbent elliptical to the arm ergometer. It is too soon to notate any discernable progressions. Will continue to monitor and progress as able.       Expected Outcomes Through exercise at rehab and home, the patient will decrease shortness of breath with daily activities and feel confident in carrying out an exercise regimen at home.                Discharge Exercise Prescription (Final Exercise Prescription Changes):  Exercise Prescription Changes - 03/19/23 1200       Response to Exercise   Blood Pressure (Admit) 100/58    Blood Pressure (Exit) 100/62    Heart Rate (Admit) 81 bpm    Heart Rate (Exercise) 89 bpm    Heart Rate (Exit) 80 bpm    Oxygen Saturation (Admit) 96 %    Oxygen Saturation (Exercise) 94 %    Oxygen Saturation (Exit) 95 %    Rating of Perceived Exertion (Exercise) 11    Perceived Dyspnea (Exercise) 1    Duration Continue with 30 min of aerobic exercise without signs/symptoms of physical distress.    Intensity THRR unchanged      Progression   Progression Continue to progress workloads to maintain intensity without signs/symptoms of physical distress.      Resistance Training   Training Prescription Yes    Weight blue bands    Reps 10-15    Time 10 Minutes             Nutrition:  Target Goals: Understanding of nutrition guidelines, daily intake of sodium 1500mg , cholesterol <  200mg , calories 30% from fat and 7% or less from saturated fats, daily to have 5 or more servings of fruits and vegetables.  Biometrics:  Pre Biometrics - 02/27/23 1450       Pre Biometrics   Grip Strength 29 kg              Nutrition Therapy Plan and Nutrition Goals:  Nutrition Therapy & Goals - 03/05/23 1347       Nutrition Therapy   Diet Heart Healthy/Carbohydrate Consistent Diet    Drug/Food Interactions  Statins/Certain Fruits      Personal Nutrition Goals   Nutrition Goal Patient to improve diet quality by using the plate method as a guide for meal planning to include lean protein/plant protein, fruits, vegetables, whole grains, nonfat dairy as part of a well-balanced diet.    Personal Goal #2 Patient to identify strategies for weight loss of 0.5-2.0# per week.    Comments [name] has medical history of DESx2 RCA, CAD, Abdominal aortic aneurysm, HTN, heart failure, OSA, paraseptal emphysema, fatty liver, DM2, hyperlipidemia, hx of lapband (currently no fill/restriction), covid-19 long hauler. He has previously participated in cardiac rehab in July 2023. At that time, he met with RD multiple times and remained pre-contemplative toward dietary intervention/lifestyle changes. He does eat out regularly and enjoys a wide variety of foods. He is receptive to decreasing alcohol intake. He reports that cardiac rehab was "not helpful". He continues follow-up with podiatry regarding right foot ulcer. A1c remains elevated at 7.8. LDL is at goal of <55 (30). He has tried multiple GLP-1 medications and declines rechallenging per documentation on 03/04/23 at cardiology appointment. Patient will benefit from participation in pulmonary rehab for nutrition, exercise, and lifestyle modification.             Nutrition Assessments:  MEDIFICTS Score Key: >=70 Need to make dietary changes  40-70 Heart Healthy Diet <= 40 Therapeutic Level Cholesterol Diet  Flowsheet Row INTENSIVE CARDIAC REHAB from 11/27/2021 in [facility]  Picture Your Plate Total Score on Admission 56      Picture Your Plate Scores: <16 Unhealthy dietary pattern with much room for improvement. 41-50 Dietary pattern unlikely to meet recommendations for good health and room for improvement. 51-60 More healthful dietary pattern, with some room for improvement.  >60 Healthy dietary  pattern, although there may be some specific behaviors that could be improved.    Nutrition Goals Re-Evaluation:  Nutrition Goals Re-Evaluation     Row Name 03/05/23 1347             Goals   Current Weight 306 lb 3.5 oz (138.9 kg)       Comment triglycerides 193, LDL 30, A1c 7.8, AST 42, GFR 46, CR 1.53       Expected Outcome [name] has medical history of DESx2 RCA, CAD, Abdominal aortic aneurysm, HTN, heart failure, OSA, paraseptal emphysema, fatty liver, DM2, hyperlipidemia, hx of lapband (currently no fill/restriction), covid-19 long hauler. He has previously participated in cardiac rehab in July 2023. At that time, he met with RD multiple times and remained pre-contemplative toward dietary intervention/lifestyle changes. He does eat out regularly and enjoys a wide variety of foods. He is receptive to decreasing alcohol intake. He reports that cardiac rehab was "not helpful". He continues follow-up with podiatry regarding right foot ulcer. A1c remains elevated at 7.8. LDL is at goal of <55 (30). He has tried multiple GLP-1 medications and declines rechallenging per  documentation on 03/04/23 at cardiology appointment. Patient will benefit from participation in pulmonary rehab for nutrition, exercise, and lifestyle modification.                Nutrition Goals Discharge (Final Nutrition Goals Re-Evaluation):  Nutrition Goals Re-Evaluation - 03/05/23 1347       Goals   Current Weight 306 lb 3.5 oz (138.9 kg)    Comment triglycerides 193, LDL 30, A1c 7.8, AST 42, GFR 46, CR 1.53    Expected Outcome [name] has medical history of DESx2 RCA, CAD, Abdominal aortic aneurysm, HTN, heart failure, OSA, paraseptal emphysema, fatty liver, DM2, hyperlipidemia, hx of lapband (currently no fill/restriction), covid-19 long hauler. He has previously participated in cardiac rehab in July 2023. At that time, he met with RD multiple times and remained pre-contemplative toward dietary  intervention/lifestyle changes. He does eat out regularly and enjoys a wide variety of foods. He is receptive to decreasing alcohol intake. He reports that cardiac rehab was "not helpful". He continues follow-up with podiatry regarding right foot ulcer. A1c remains elevated at 7.8. LDL is at goal of <55 (30). He has tried multiple GLP-1 medications and declines rechallenging per documentation on 03/04/23 at cardiology appointment. Patient will benefit from participation in pulmonary rehab for nutrition, exercise, and lifestyle modification.             Psychosocial: Target Goals: Acknowledge presence or absence of significant depression and/or stress, maximize coping skills, provide positive support system. Participant is able to verbalize types and ability to use techniques and skills needed for reducing stress and depression.  Initial Review & Psychosocial Screening:  Initial Psych Review & Screening - 02/27/23 1331       Initial Review   Current issues with Current Stress Concerns    Source of Stress Concerns Unable to perform yard/household activities;Unable to participate in former interests or hobbies      Family Dynamics   Good Support System? Yes      Barriers   Psychosocial barriers to participate in program There are no identifiable barriers or psychosocial needs.      Screening Interventions   Interventions Encouraged to exercise    Expected Outcomes Short Term goal: Utilizing psychosocial counselor, staff and physician to assist with identification of specific Stressors or current issues interfering with healing process. Setting desired goal for each stressor or current issue identified.;Short Term goal: Identification and review with participant of any Quality of Life or Depression concerns found by scoring the questionnaire.;Long Term Goal: Stressors or current issues are controlled or eliminated.;Long Term goal: The participant improves quality of Life and PHQ9 Scores as seen  by post scores and/or verbalization of changes             Quality of Life Scores:  Scores of 19 and below usually indicate a poorer quality of life in these areas.  A difference of  2-3 points is a clinically meaningful difference.  A difference of 2-3 points in the total score of the Quality of Life Index has been associated with significant improvement in overall quality of life, self-image, physical symptoms, and general health in studies assessing change in quality of life.  PHQ-9: Review Flowsheet  More data exists      03/06/2023 02/27/2023 01/22/2023 12/04/2022 09/03/2022  Depression screen PHQ 2/9  Decreased Interest 0 0 0 0 0  Down, Depressed, Hopeless 0 0 0 0 0  PHQ - 2 Score 0 0 0 0 0  Altered sleeping 2 1 - - -  Tired, decreased energy - 0 - - -  Change in appetite 1 1 - - -  Feeling bad or failure about yourself  0 1 - - -  Trouble concentrating 0 0 - - -  Moving slowly or fidgety/restless 0 0 - - -  Suicidal thoughts 0 0 - - -  PHQ-9 Score 3 3 - - -  Difficult doing work/chores Not difficult at all Somewhat difficult - - -    Details           Interpretation of Total Score  Total Score Depression Severity:  1-4 = Minimal depression, 5-9 = Mild depression, 10-14 = Moderate depression, 15-19 = Moderately severe depression, 20-27 = Severe depression   Psychosocial Evaluation and Intervention:  Psychosocial Evaluation - 02/27/23 1333       Psychosocial Evaluation & Interventions   Interventions Encouraged to exercise with the program and follow exercise prescription;Stress management education    Comments [name] is dealing with stress from his health and not being able to do the activities he used to do.    Expected Outcomes For [name] to participate in PR free of any psychosocial barriers or concerns.    Continue Psychosocial Services  No Follow up required             Psychosocial Re-Evaluation:  Psychosocial Re-Evaluation     Row Name 03/11/23  1432             Psychosocial Re-Evaluation   Current issues with Current Stress Concerns       Comments [name] started the program on 9/17. There are no new psychosocial barriers or concerns since orientation.       Expected Outcomes [name] will have controlled or decreased stress upon completion of pulmonary rehab       Interventions Encouraged to attend Pulmonary Rehabilitation for the exercise       Continue Psychosocial Services  No Follow up required                Psychosocial Discharge (Final Psychosocial Re-Evaluation):  Psychosocial Re-Evaluation - 03/11/23 1432       Psychosocial Re-Evaluation   Current issues with Current Stress Concerns    Comments [name] started the program on 9/17. There are no new psychosocial barriers or concerns since orientation.    Expected Outcomes [name] will have controlled or decreased stress upon completion of pulmonary rehab    Interventions Encouraged to attend Pulmonary Rehabilitation for the exercise    Continue Psychosocial Services  No Follow up required             Education: Education Goals: Education classes will be provided on a weekly basis, covering required topics. Participant will state understanding/return demonstration of topics presented.  Learning Barriers/Preferences:  Learning Barriers/Preferences - 02/27/23 1333       Learning Barriers/Preferences   Learning Barriers Sight;Hearing    Learning Preferences Group Instruction;Skilled Demonstration;Individual Instruction;Written Material             Education Topics: Know Your Numbers Group instruction that is supported by a PowerPoint presentation. Instructor discusses importance of knowing and understanding resting, exercise, and post-exercise oxygen saturation, heart rate, and blood pressure. Oxygen saturation, heart rate, blood pressure, rating of perceived exertion, and dyspnea are reviewed along with a normal range for these values.     Exercise for the Pulmonary Patient Group instruction that is supported by a PowerPoint presentation. Instructor discusses benefits of exercise, core components of exercise, frequency, duration, and intensity of an  exercise routine, importance of utilizing pulse oximetry during exercise, safety while exercising, and options of places to exercise outside of rehab.    MET Level  Group instruction provided by PowerPoint, verbal discussion, and written material to support subject matter. Instructor reviews what METs are and how to increase METs.    Pulmonary Medications Verbally interactive group education provided by instructor with focus on inhaled medications and proper administration.   Anatomy and Physiology of the Respiratory System Group instruction provided by PowerPoint, verbal discussion, and written material to support subject matter. Instructor reviews respiratory cycle and anatomical components of the respiratory system and their functions. Instructor also reviews differences in obstructive and restrictive respiratory diseases with examples of each.    Oxygen Safety Group instruction provided by PowerPoint, verbal discussion, and written material to support subject matter. There is an overview of "What is Oxygen" and "Why do we need it".  Instructor also reviews how to create a safe environment for oxygen use, the importance of using oxygen as prescribed, and the risks of noncompliance. There is a brief discussion on traveling with oxygen and resources the patient may utilize.   Oxygen Use Group instruction provided by PowerPoint, verbal discussion, and written material to discuss how supplemental oxygen is prescribed and different types of oxygen supply systems. Resources for more information are provided.    Breathing Techniques Group instruction that is supported by demonstration and informational handouts. Instructor discusses the benefits of pursed lip and diaphragmatic  breathing and detailed demonstration on how to perform both.     Risk Factor Reduction Group instruction that is supported by a PowerPoint presentation. Instructor discusses the definition of a risk factor, different risk factors for pulmonary disease, and how the heart and lungs work together.   Pulmonary Diseases Group instruction provided by PowerPoint, verbal discussion, and written material to support subject matter. Instructor gives an overview of the different type of pulmonary diseases. There is also a discussion on risk factors and symptoms as well as ways to manage the diseases.   Stress and Energy Conservation Group instruction provided by PowerPoint, verbal discussion, and written material to support subject matter. Instructor gives an overview of stress and the impact it can have on the body. Instructor also reviews ways to reduce stress. There is also a discussion on energy conservation and ways to conserve energy throughout the day.   Warning Signs and Symptoms Group instruction provided by PowerPoint, verbal discussion, and written material to support subject matter. Instructor reviews warning signs and symptoms of stroke, heart attack, cold and flu. Instructor also reviews ways to prevent the spread of infection.   Other Education Group or individual verbal, written, or video instructions that support the educational goals of the pulmonary rehab program.    Knowledge Questionnaire Score:  Knowledge Questionnaire Score - 02/27/23 1420       Knowledge Questionnaire Score   Pre Score 14/18             Core Components/Risk Factors/Patient Goals at Admission:  Personal Goals and Risk Factors at Admission - 02/27/23 1335       Core Components/Risk Factors/Patient Goals on Admission    Weight Management Weight Loss;Yes    Intervention Weight Management: Develop a combined nutrition and exercise program designed to reach desired caloric intake, while maintaining  appropriate intake of nutrient and fiber, sodium and fats, and appropriate energy expenditure required for the weight goal.;Weight Management: Provide education and appropriate resources to help participant work on and attain dietary  goals.;Weight Management/Obesity: Establish reasonable short term and long term weight goals.;Obesity: Provide education and appropriate resources to help participant work on and attain dietary goals.    Expected Outcomes Short Term: Continue to assess and modify interventions until short term weight is achieved;Long Term: Adherence to nutrition and physical activity/exercise program aimed toward attainment of established weight goal;Weight Loss: Understanding of general recommendations for a balanced deficit meal plan, which promotes 1-2 lb weight loss per week and includes a negative energy balance of [phone] kcal/d;Understanding recommendations for meals to include 15-35% energy as protein, 25-35% energy from fat, 35-60% energy from carbohydrates, less than 200mg  of dietary cholesterol, 20-35 gm of total fiber daily;Understanding of distribution of calorie intake throughout the day with the consumption of 4-5 meals/snacks    Improve shortness of breath with ADL's Yes    Intervention Provide education, individualized exercise plan and daily activity instruction to help decrease symptoms of SOB with activities of daily living.    Expected Outcomes Short Term: Improve cardiorespiratory fitness to achieve a reduction of symptoms when performing ADLs;Long Term: Be able to perform more ADLs without symptoms or delay the onset of symptoms    Intervention Provide education about signs/symptoms and action to take for hypo/hyperglycemia.;Provide education about proper nutrition, including hydration, and aerobic/resistive exercise prescription along with prescribed medications to achieve blood glucose in normal ranges: Fasting glucose 65-99 mg/dL    Expected Outcomes Short Term:  Participant verbalizes understanding of the signs/symptoms and immediate care of hyper/hypoglycemia, proper foot care and importance of medication, aerobic/resistive exercise and nutrition plan for blood glucose control.;Long Term: Attainment of HbA1C < 7%.             Core Components/Risk Factors/Patient Goals Review:   Goals and Risk Factor Review     Row Name 03/11/23 1436             Core Components/Risk Factors/Patient Goals Review   Personal Goals Review Weight Management/Obesity;Improve shortness of breath with ADL's;Develop more efficient breathing techniques such as purse lipped breathing and diaphragmatic breathing and practicing self-pacing with activity.;Diabetes       Review [name] has only attended 2 sessions. His goal is progressing for weight loss. [name] is working with our dietician for weight loss goals. Goal progressing on improving his shortness of breath with ADLs. Goal progressing on developing more efficient breathing techniques such as purse lipped breathing and diaphragmatic breathing; and practicing self-pacing with activity. Goal progressing for diabetes. [name]'s blood sugars have been ranging 270's-320's. We will continue to monitor his blood sugars throughout the program. [name] also complains of foot sores that are almost healed, but he is reluctant to do any type of exercise that involve moving his feet.       Expected Outcomes See admission goals                Core Components/Risk Factors/Patient Goals at Discharge (Final Review):   Goals and Risk Factor Review - 03/11/23 1436       Core Components/Risk Factors/Patient Goals Review   Personal Goals Review Weight Management/Obesity;Improve shortness of breath with ADL's;Develop more efficient breathing techniques such as purse lipped breathing and diaphragmatic breathing and practicing self-pacing with activity.;Diabetes    Review [name] has only attended 2 sessions. His goal is  progressing for weight loss. [name] is working with our dietician for weight loss goals. Goal progressing on improving his shortness of breath with ADLs. Goal progressing on developing more efficient breathing techniques such as purse lipped breathing and  diaphragmatic breathing; and practicing self-pacing with activity. Goal progressing for diabetes. [name]'s blood sugars have been ranging 270's-320's. We will continue to monitor his blood sugars throughout the program. [name] also complains of foot sores that are almost healed, but he is reluctant to do any type of exercise that involve moving his feet.    Expected Outcomes See admission goals             ITP Comments: Pt is making expected progress toward Pulmonary Rehab goals after completing 1 session. Recommend continued exercise, life style modification, education, and utilization of breathing techniques to increase stamina and strength, while also decreasing shortness of breath with exertion.  Dr. Mechele Collin is Medical Director for Pulmonary Rehab at [facility].

## 2023-03-21 ENCOUNTER — Encounter (HOSPITAL_COMMUNITY): Payer: Medicare Other

## 2023-03-26 ENCOUNTER — Encounter (HOSPITAL_COMMUNITY): Payer: Medicare Other

## 2023-03-26 ENCOUNTER — Ambulatory Visit: Payer: Medicare Other | Attending: Internal Medicine

## 2023-03-26 ENCOUNTER — Other Ambulatory Visit (HOSPITAL_COMMUNITY): Payer: Self-pay

## 2023-03-26 DIAGNOSIS — I2510 Atherosclerotic heart disease of native coronary artery without angina pectoris: Secondary | ICD-10-CM

## 2023-03-26 NOTE — Telephone Encounter (Signed)
Per test claims the [employer] is stating that is must be filed under Medicare Part B. Patient may want to call insurance for more information.

## 2023-03-27 LAB — BASIC METABOLIC PANEL
BUN/Creatinine Ratio: 14 (ref 10–24)
BUN: 17 mg/dL (ref 8–27)
CO2: 28 mmol/L (ref 20–29)
Calcium: 8.7 mg/dL (ref 8.6–10.2)
Chloride: 97 mmol/L (ref 96–106)
Creatinine, Ser: 1.22 mg/dL (ref 0.76–1.27)
Glucose: 210 mg/dL — ABNORMAL HIGH (ref 70–99)
Potassium: 4.1 mmol/L (ref 3.5–5.2)
Sodium: 139 mmol/L (ref 134–144)
eGFR: 65 mL/min/{1.73_m2} (ref >59)

## 2023-03-28 ENCOUNTER — Ambulatory Visit: Payer: Medicare Other | Admitting: Family Medicine

## 2023-03-28 ENCOUNTER — Ambulatory Visit (INDEPENDENT_AMBULATORY_CARE_PROVIDER_SITE_OTHER): Payer: Medicare Other | Admitting: Podiatry

## 2023-03-28 ENCOUNTER — Other Ambulatory Visit: Payer: Self-pay | Admitting: Internal Medicine

## 2023-03-28 DIAGNOSIS — E1149 Type 2 diabetes mellitus with other diabetic neurological complication: Secondary | ICD-10-CM

## 2023-03-28 DIAGNOSIS — M79675 Pain in left toe(s): Secondary | ICD-10-CM

## 2023-03-28 DIAGNOSIS — L84 Corns and callosities: Secondary | ICD-10-CM | POA: Diagnosis not present

## 2023-03-28 DIAGNOSIS — B351 Tinea unguium: Secondary | ICD-10-CM

## 2023-03-28 DIAGNOSIS — M79674 Pain in right toe(s): Secondary | ICD-10-CM

## 2023-04-01 ENCOUNTER — Encounter: Payer: Self-pay | Admitting: Internal Medicine

## 2023-04-01 MED ORDER — TORSEMIDE 20 MG PO TABS: 60.0000 mg | ORAL_TABLET | Freq: Every day | ORAL | 3 refills | Status: DC

## 2023-04-01 MED ORDER — POTASSIUM CHLORIDE CRYS ER 20 MEQ PO TBCR: 60.0000 meq | EXTENDED_RELEASE_TABLET | Freq: Every day | ORAL | 3 refills | Status: DC

## 2023-04-02 ENCOUNTER — Encounter (HOSPITAL_COMMUNITY)
Admission: RE | Admit: 2023-04-02 | Discharge: 2023-04-02 | Disposition: A | Discharge status: Home / Self Care | Payer: Medicare Other | Source: Ambulatory Visit | Attending: Pulmonary Disease | Admitting: Pulmonary Disease

## 2023-04-02 VITALS — Wt 306.0 lb

## 2023-04-02 DIAGNOSIS — R0609 Other forms of dyspnea: Secondary | ICD-10-CM | POA: Insufficient documentation

## 2023-04-02 LAB — GLUCOSE, CAPILLARY
Glucose-Capillary: 181 mg/dL — ABNORMAL HIGH (ref 70–99)
Glucose-Capillary: 140 mg/dL — ABNORMAL HIGH (ref 70–99)

## 2023-04-02 NOTE — Progress Notes (Signed)
Daily Session Note  Patient Details  Name: [name] MRN: [id number] Date of Birth: [birth year] Referring Provider:   Doristine Devoid Pulmonary Rehab Walk Test from 02/27/2023 in [facility]  Referring Provider [place]       Encounter Date: 04/02/2023  Check In:  Session Check In - 04/02/23 1117       Check-In   Supervising physician immediately available to respond to emergencies [facility] MD immediately available    Physician(s) Jari Favre, NP    Location [facility]    Staff Present Essie Hart, RN, BSN;Randi Idelle Crouch BS, ACSM-CEP, Exercise Physiologist;Terryn Rosenkranz Chester Holstein, MS, Exercise Physiologist;Samantha Belarus, RD, LDN    Virtual Visit No    Medication changes reported     No    Fall or balance concerns reported    No    Tobacco Cessation No Change    Warm-up and Cool-down Performed as group-led instruction   left early due to blood sugar being too high   Resistance Training Performed Yes    VAD Patient? No    PAD/SET Patient? No      Pain Assessment   Currently in Pain? No/denies    Multiple Pain Sites No             Capillary Blood Glucose: No results found for this or any previous visit (from the past 24 hour(s)).   Exercise Prescription Changes - 04/02/23 1200       Response to Exercise   Blood Pressure (Admit) 96/62    Blood Pressure (Exercise) 132/68    Blood Pressure (Exit) 94/58    Heart Rate (Admit) 70 bpm    Heart Rate (Exercise) 84 bpm    Heart Rate (Exit) 75 bpm    Oxygen Saturation (Admit) 96 %    Oxygen Saturation (Exercise) 96 %    Oxygen Saturation (Exit) 96 %    Rating of Perceived Exertion (Exercise) 12    Perceived Dyspnea (Exercise) 2    Duration Continue with 30 min of aerobic exercise without signs/symptoms of physical distress.    Intensity THRR unchanged      Progression   Progression Continue to progress workloads to maintain intensity  without signs/symptoms of physical distress.      Resistance Training   Training Prescription Yes    Weight blue bands    Reps 10-15    Time 10 Minutes      Bike   Level 3    Minutes 15    METs 2.4      Track   Minutes 15             Social History   Tobacco Use  Smoking Status Former   Current packs/day: 0.00   Average packs/day: 4.0 packs/day for 20.0 years (80.0 ttl pk-yrs)   Types: Cigarettes   Start date: 07/19/1973   Quit date: 12/13/1991   Years since quitting: 31.3  Smokeless Tobacco Never    Goals Met:  Proper associated with RPD/PD & O2 Sat Independence with exercise equipment Exercise tolerated well No report of concerns or symptoms today Strength training completed today  Goals Unmet:  Not Applicable  Comments: Service time is from 1011 to 1137.  Dr. Mechele Collin is Medical Director for Pulmonary Rehab at [facility].

## 2023-04-03 NOTE — Progress Notes (Addendum)
Subjective: No chief complaint on file.  67 year old male presents the office for follow-up evaluation of preulcerative calluses both of his feet as well as for his nails to be trimmed.  Has not seen any open lesions.  No drainage or pus.  He does not report any recent injuries or change otherwise.  No new concerns.   Objective: AAO x3, NAD DP/PT pulses palpable bilaterally, CRT less than 3 seconds On the plantar aspects of bilateral hallux hyperkeratotic tissue.  Upon debridement there is no underlying ulceration, drainage or any signs of infection today.  There is no open lesions noted bilaterally.  Nails are hypertrophic, dystrophic, brittle, discolored, elongated 10. No surrounding redness or drainage. Tenderness nails 1-5 bilaterally. No other open lesions or pre-ulcerative lesions are identified today. No pain with calf compression, swelling, warmth, erythema  Assessment: 67 year old male with preulcerative calluses, symptomatic onychomycosis   Plan: -All treatment options discussed with the patient including all alternatives, risks, complications.  -Overall wounds, toes are much improved.  He should monitor closely for any signs or symptoms of infection or further skin breakdown but no immediately should any occur. -Debrided the hyperkeratotic lesions x 2 without any complications or bleeding as it was minimal today.There is no reoccurrence of the ulceration at this time but continue to monitor.  Moisturizer daily. -Sharply debrided the nails x 10 without any complications or bleeding a courtesy today. -Monitor for any clinical signs or symptoms of infection and directed to call the office immediately should any occur or go to the ER.  Return in about 2 months (around 05/29/2023) for nail/callus trim.  Vivi Barrack DPM

## 2023-04-04 ENCOUNTER — Encounter (HOSPITAL_COMMUNITY)
Admission: RE | Admit: 2023-04-04 | Discharge: 2023-04-04 | Disposition: A | Discharge status: Home / Self Care | Payer: Medicare Other | Source: Ambulatory Visit | Attending: Pulmonary Disease | Admitting: Pulmonary Disease

## 2023-04-04 DIAGNOSIS — R0609 Other forms of dyspnea: Secondary | ICD-10-CM

## 2023-04-04 LAB — GLUCOSE, CAPILLARY
Glucose-Capillary: 173 mg/dL — ABNORMAL HIGH (ref 70–99)
Glucose-Capillary: 135 mg/dL — ABNORMAL HIGH (ref 70–99)

## 2023-04-04 NOTE — Progress Notes (Signed)
Daily Session Note  Patient Details  Name: [name] MRN: [id number] Date of Birth: 01-Jan-1956 Referring Provider:   Doristine Devoid Pulmonary Rehab Walk Test from 02/27/2023 in [facility]  Referring Provider [place]       Encounter Date: 04/04/2023  Check In:  Session Check In - 04/04/23 1033       Check-In   Supervising physician immediately available to respond to emergencies [facility] MD immediately available    Physician(s) Jari Favre, NP    Location [facility]    Staff Present Essie Hart, RN, BSN;Randi Dionisio Paschal, ACSM-CEP, Exercise Physiologist;Casey Hermine Messick Belarus, RD, Gaylord Shih, MS, Exercise Physiologist    Virtual Visit No    Medication changes reported     No    Fall or balance concerns reported    No    Tobacco Cessation No Change    Warm-up and Cool-down Performed as group-led instruction   left early due to blood sugar being too high   Resistance Training Performed Yes    VAD Patient? No    PAD/SET Patient? No      Pain Assessment   Currently in Pain? No/denies    Multiple Pain Sites No             Capillary Blood Glucose: Results for orders placed or performed during the hospital encounter of 04/02/23 (from the past 24 hour(s))  Glucose, capillary     Status: Abnormal   Collection Time: 04/04/23 10:10 AM  Result Value Ref Range   Glucose-Capillary 173 (H) 70 - 99 mg/dL      Social History   Tobacco Use  Smoking Status Former   Current packs/day: 0.00   Average packs/day: 4.0 packs/day for 20.0 years (80.0 ttl pk-yrs)   Types: Cigarettes   Start date: 07/19/1973   Quit date: 12/13/1991   Years since quitting: 31.3  Smokeless Tobacco Never    Goals Met:  Independence with exercise equipment Exercise tolerated well No report of concerns or symptoms today Strength training completed today  Goals Unmet:  Not Applicable  Comments: Service time  is from 1007 to 1140  Dr. Mechele Collin is Medical Director for Pulmonary Rehab at [facility].

## 2023-04-09 ENCOUNTER — Encounter (HOSPITAL_COMMUNITY): Payer: Medicare Other

## 2023-04-11 ENCOUNTER — Encounter: Payer: Self-pay | Admitting: Family Medicine

## 2023-04-11 ENCOUNTER — Encounter (HOSPITAL_COMMUNITY): Payer: Medicare Other

## 2023-04-12 ENCOUNTER — Telehealth (HOSPITAL_COMMUNITY): Payer: Self-pay | Admitting: *Deleted

## 2023-04-12 NOTE — Telephone Encounter (Signed)
[name] called earlier to state he would not be in Pulmonary Rehab until after Thanksgiving due to staying in [place]. Returned call and discussed with pt discharge from the program since he has only been able to complete 3 sessions and will have such a long break. He can have another referral sent from Dr. Judeth Horn when he is ready.  Ethelda Chick BS, ACSM-CEP 04/12/2023 11:08 AM

## 2023-04-12 NOTE — Telephone Encounter (Signed)
Ok to send in zpack but he needs appointment if not improving.  [name]. [name], MD 04/12/2023 1:07 PM

## 2023-04-12 NOTE — Telephone Encounter (Signed)
See note

## 2023-04-15 ENCOUNTER — Ambulatory Visit: Payer: Self-pay | Admitting: Surgery

## 2023-04-15 NOTE — H&P (View-Only) (Signed)
[place] [id number]   Referring Provider: Ardith Dark, MD  Subjective   Chief Complaint: NEW WEIGHT LOSS   History of Present Illness:  Has followed up with his cardiologist and ok'd to proceed without further testing.   12/18/22: Very pleasant 67 year old man who underwent placement of a Lap-Band by Dr. Daphine Deutscher in October 2015. Op note describes APS system, 3 suture plication using Surgidac, and 1 suture closure of the hiatus posteriorly (Surgidac & ty-knot). He has never really had any significant weight loss from this. Couple years after surgery, he was on vacation in [place] and had ceviche which caused what may have been some food poisoning, and he notes that he was unable to vomit but had terrible nausea and retching during this episode. Had chronic diarrhea after this. It has taken him several years to get back to a fairly normal GI function and he underwent a fair bit of workup as listed below. He had all the fluid removed from his band after that episode and has not followed up since. He endorses that he has to be very careful with eating, if he eats 1 bite of bread and fails to chew it up excessively, he will have dysphagia and sticking. He still has to take a Pepto-Bismol every morning to prevent indigestion, but does not have what he would describe as heartburn or significant reflux. He would like the Lap-Band removed.  EGD: 2016 with Dr. Christella Hartigan was unremarkable. Last visit here: 2017 at which time he was having atypical left-sided chest pain being worked up by his PCP and pulmonology, workup had been negative at that time. He noted frequent globus sensations while eating a meal and had had 1 episode of regurgitation despite adequate chewing and slowed eating habits. He had all the fluid removed at that time. He was 304 pounds at that time with a BMI of 41.3. Preop weight in 2015 was 311 pounds with a BMI of 42. He has continued to have issues with shortness of breath and follows with  Dr. Babs Bertin whom he last saw in April of this year, but he also did have a stent placed for his coronary artery disease in 2023. 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 Cardiology- Dr. Izora Ribas- 10/03/22; issues with fatigue, diarrhea, CKD and alcohol consumption limit diuretic options; plan is to repeat abdominal aortic ultrasound in 2025. CT scan of the chest in August 2023 for post-- COVID dyspnea was negative for any intrathoracic pathology, specifically noting that the Lap-Band was in expected position with no acute abnormality, but did note extensive multivessel coronary artery calcification. VQ scan in February 2023 negative for any perfusion defect in either lung. He also had an ultrasound in 2018 to workup his GI issues noting a probable 6.5 mm gallbladder polyp and recommended follow-up ultrasound 1 year later. Also noted hepatic steatosis, and a 3.4 cm infrarenal abdominal aortic aneurysm also very recommended for ultrasound follow-up 3 years later. Echo 02/2022: EF 60-65%, no regional wall motion abnormalities  There is a history of diabetes with diabetic foot ulcers. Coronary artery disease, hypertension, dyslipidemia, struct of sleep apnea, actinic keratosis. 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  Review of Systems: A complete review of systems was obtained from the patient. I have reviewed this information and discussed as appropriate with the patient. See HPI as well for other ROS.  Medical History: Past Medical History:  Diagnosis Date  Arthritis  Diabetes mellitus without complication (CMS/HHS-HCC)  DVT (deep venous thrombosis) (CMS/HHS-HCC)  Sleep apnea   There is no problem list on file for this patient.  Past Surgical History:  Procedure Laterality Date  LAPAROSCOPIC GASTROPLASTY NON-VERTICAL BANDING FOR OBESITY    Allergies  Allergen Reactions  Penicillins Unknown   Current  Outpatient Medications on File Prior to Visit  Medication Sig Dispense Refill  acetaminophen (TYLENOL) 500 MG tablet Take by mouth  aspirin 81 MG EC tablet Take 81 mg by mouth once daily  budesonide-glycopyrrolate-formoterol (BREZTRI AEROSPHERE) 160-9-4.8 mcg/actuation inhaler Inhale 2 Inhalations into the lungs 2 (two) times daily  cholecalciferol (VITAMIN D3) 2,000 unit tablet Take 2,000 Units by mouth once daily  dilTIAZem (CARDIZEM CD) 180 MG CD capsule Take 180 mg by mouth once daily  diphenhydrAMINE (BENADRYL) 25 mg tablet Take by mouth  ezetimibe (ZETIA) 10 mg tablet Take 1 tablet by mouth once daily  JARDIANCE 10 mg tablet  lisinopriL (ZESTRIL) 20 MG tablet Take 1 tablet by mouth once daily  loratadine (CLARITIN) 10 mg tablet Take 10 mg by mouth once daily  multivitamin tablet Take by mouth  potassium chloride (KLOR-CON M20) 20 MEQ ER tablet Take by mouth  rosuvastatin (CRESTOR) 40 MG tablet Take 1 tablet by mouth once daily  silver sulfADIAZINE (SSD) 1 % cream Apply topically  TORsemide (DEMADEX) 20 MG tablet Take 40 mg by mouth  TRESIBA FLEXTOUCH U-200 pen injector (concentration 200 units/mL)   No current facility-administered medications on file prior to visit.   Family History  Problem Relation Age of Onset  Deep vein thrombosis (DVT or abnormal blood clot formation) Mother  High blood pressure (Hypertension) Brother    Social History   Tobacco Use  Smoking Status Former  Types: Cigarettes  Start date: 1993  Smokeless Tobacco Never    Social History   Socioeconomic History  Marital status: Married  Tobacco Use  Smoking status: Former  Types: Cigarettes  Start date: 1993  Smokeless tobacco: Never  Substance and Sexual Activity  Alcohol use: Yes  Drug use: Never   Social Determinants of Corporate investment banker Strain: Low Risk (11/30/2022)  Received [facility]  Overall Financial Resource Strain (CARDIA)  Difficulty of Paying Living Expenses:  Not hard at all  Food Insecurity: No Food Insecurity (11/30/2022)  Received from [facility]  Hunger Vital Sign  Worried About Running Out of Food in the Last Year: Never true  Ran Out of Food in the Last Year: Never true  Transportation Needs: No Transportation Needs (11/30/2022)  Received from [facility] - Transportation  Lack of Transportation (Medical): No  Lack of Transportation (Non-Medical): No  Physical Activity: Unknown (11/30/2022)  Received from [facility]  Exercise Vital Sign  Days of Exercise per Week: 0 days  Stress: No Stress Concern Present (11/30/2022)  Received from [facility] - Occupational Stress Questionnaire  Feeling of Stress : Only a little  Social Connections: Moderately Integrated (11/30/2022)  Received from [facility]  Social Connection and Isolation Panel [NHANES]  Frequency of Communication with Friends and Family: More than three times a week  Frequency of Social Gatherings with Friends and Family: Once a week  Attends Religious Services: 1 to 4 times per year  Active Member of Clubs or Organizations: No  Marital Status: Married   Objective:   Vitals:  12/18/22 1414 12/18/22 1417  BP: 100/60  Pulse: 82  Temp: 36.8 C (98.3 F)  SpO2: 96%  Weight: (!) 133.3 kg (293 lb 12.8 oz)  Height: 185.4 cm (6\' 1" )  PainSc: 0-No pain  PainLoc: Abdomen   Body mass index is 38.76 kg/m.  Alert, calm, cooperative Unlabored respirations  Assessment and Plan:  Diagnoses and all orders for this visit:  LAP-BAND surgery status - X-ray upper GI  Gallbladder polyp - US gallbladder; Future   He would like the Lap-Band removed and I think that that is reasonable given dysphagia symptoms and failure to achieve significant weight loss, 9 years postop at this point. We discussed that given he does have ongoing obesity with comorbidities I would typically recommend converting him to Roux-en-Y gastric bypass but  given his health status at this point I think that he is fairly high risk for this and would recommend we just start with removing the Lap-Band in hopes of improving his quality of life. We discussed the operation and risks involved. We will request cardiac clearance. I will also get an upper GI to evaluate for any other etiology of his dysphagia and we will repeat his gallbladder ultrasound to survey the previously noted gallbladder polyp.  Gurdeep Keesey Carlye Grippe, MD

## 2023-04-15 NOTE — H&P (Signed)
[place] [id number]   Referring Provider: Ardith Dark, MD  Subjective   Chief Complaint: NEW WEIGHT LOSS   History of Present Illness:  Has followed up with his cardiologist and ok'd to proceed without further testing.   12/18/22: Very pleasant 67 year old man who underwent placement of a Lap-Band by Dr. Daphine Deutscher in October 2015. Op note describes APS system, 3 suture plication using Surgidac, and 1 suture closure of the hiatus posteriorly (Surgidac & ty-knot). He has never really had any significant weight loss from this. Couple years after surgery, he was on vacation in [place] and had ceviche which caused what may have been some food poisoning, and he notes that he was unable to vomit but had terrible nausea and retching during this episode. Had chronic diarrhea after this. It has taken him several years to get back to a fairly normal GI function and he underwent a fair bit of workup as listed below. He had all the fluid removed from his band after that episode and has not followed up since. He endorses that he has to be very careful with eating, if he eats 1 bite of bread and fails to chew it up excessively, he will have dysphagia and sticking. He still has to take a Pepto-Bismol every morning to prevent indigestion, but does not have what he would describe as heartburn or significant reflux. He would like the Lap-Band removed.  EGD: 2016 with Dr. Christella Hartigan was unremarkable. Last visit here: 2017 at which time he was having atypical left-sided chest pain being worked up by his PCP and pulmonology, workup had been negative at that time. He noted frequent globus sensations while eating a meal and had had 1 episode of regurgitation despite adequate chewing and slowed eating habits. He had all the fluid removed at that time. He was 304 pounds at that time with a BMI of 41.3. Preop weight in 2015 was 311 pounds with a BMI of 42. He has continued to have issues with shortness of breath and follows with  Dr. Babs Bertin whom he last saw in April of this year, but he also did have a stent placed for his coronary artery disease in 2023. 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 Cardiology- Dr. Izora Ribas- 10/03/22; issues with fatigue, diarrhea, CKD and alcohol consumption limit diuretic options; plan is to repeat abdominal aortic ultrasound in 2025. CT scan of the chest in August 2023 for post-- COVID dyspnea was negative for any intrathoracic pathology, specifically noting that the Lap-Band was in expected position with no acute abnormality, but did note extensive multivessel coronary artery calcification. VQ scan in February 2023 negative for any perfusion defect in either lung. He also had an ultrasound in 2018 to workup his GI issues noting a probable 6.5 mm gallbladder polyp and recommended follow-up ultrasound 1 year later. Also noted hepatic steatosis, and a 3.4 cm infrarenal abdominal aortic aneurysm also very recommended for ultrasound follow-up 3 years later. Echo 02/2022: EF 60-65%, no regional wall motion abnormalities  There is a history of diabetes with diabetic foot ulcers. Coronary artery disease, hypertension, dyslipidemia, struct of sleep apnea, actinic keratosis. 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  Review of Systems: A complete review of systems was obtained from the patient. I have reviewed this information and discussed as appropriate with the patient. See HPI as well for other ROS.  Medical History: Past Medical History:  Diagnosis Date  Arthritis  Diabetes mellitus without complication (CMS/HHS-HCC)  DVT (deep venous thrombosis) (CMS/HHS-HCC)  Sleep apnea   There is no problem list on file for this patient.  Past Surgical History:  Procedure Laterality Date  LAPAROSCOPIC GASTROPLASTY NON-VERTICAL BANDING FOR OBESITY    Allergies  Allergen Reactions  Penicillins Unknown   Current  Outpatient Medications on File Prior to Visit  Medication Sig Dispense Refill  acetaminophen (TYLENOL) 500 MG tablet Take by mouth  aspirin 81 MG EC tablet Take 81 mg by mouth once daily  budesonide-glycopyrrolate-formoterol (BREZTRI AEROSPHERE) 160-9-4.8 mcg/actuation inhaler Inhale 2 Inhalations into the lungs 2 (two) times daily  cholecalciferol (VITAMIN D3) 2,000 unit tablet Take 2,000 Units by mouth once daily  dilTIAZem (CARDIZEM CD) 180 MG CD capsule Take 180 mg by mouth once daily  diphenhydrAMINE (BENADRYL) 25 mg tablet Take by mouth  ezetimibe (ZETIA) 10 mg tablet Take 1 tablet by mouth once daily  JARDIANCE 10 mg tablet  lisinopriL (ZESTRIL) 20 MG tablet Take 1 tablet by mouth once daily  loratadine (CLARITIN) 10 mg tablet Take 10 mg by mouth once daily  multivitamin tablet Take by mouth  potassium chloride (KLOR-CON M20) 20 MEQ ER tablet Take by mouth  rosuvastatin (CRESTOR) 40 MG tablet Take 1 tablet by mouth once daily  silver sulfADIAZINE (SSD) 1 % cream Apply topically  TORsemide (DEMADEX) 20 MG tablet Take 40 mg by mouth  TRESIBA FLEXTOUCH U-200 pen injector (concentration 200 units/mL)   No current facility-administered medications on file prior to visit.   Family History  Problem Relation Age of Onset  Deep vein thrombosis (DVT or abnormal blood clot formation) Mother  High blood pressure (Hypertension) Brother    Social History   Tobacco Use  Smoking Status Former  Types: Cigarettes  Start date: 1993  Smokeless Tobacco Never    Social History   Socioeconomic History  Marital status: Married  Tobacco Use  Smoking status: Former  Types: Cigarettes  Start date: 1993  Smokeless tobacco: Never  Substance and Sexual Activity  Alcohol use: Yes  Drug use: Never   Social Determinants of Corporate investment banker Strain: Low Risk (11/30/2022)  Received from [facility]  Overall Financial Resource Strain (CARDIA)  Difficulty of Paying Living Expenses:  Not hard at all  Food Insecurity: No Food Insecurity (11/30/2022)  Received from [facility]  Hunger Vital Sign  Worried About Running Out of Food in the Last Year: Never true  Ran Out of Food in the Last Year: Never true  Transportation Needs: No Transportation Needs (11/30/2022)  Received [facility] - [facility]  Lack of Transportation (Medical): No  Lack of Transportation (Non-Medical): No  Physical Activity: Unknown (11/30/2022)  Received from [facility]  Exercise Vital Sign  Days of Exercise per Week: 0 days  Stress: No Stress Concern Present (11/30/2022)  Received from [facility] - Occupational Stress Questionnaire  Feeling of Stress : Only a little  Social Connections: Moderately Integrated (11/30/2022)  Received from [facility]  Social Connection and Isolation Panel [NHANES]  Frequency of Communication with Friends and Family: More than three times a week  Frequency of Social Gatherings with Friends and Family: Once a week  Attends Religious Services: 1 to 4 times per year  Active Member of Clubs or Organizations: No  Marital Status: Married   Objective:   Vitals:  12/18/22 1414 12/18/22 1417  BP: 100/60  Pulse: 82  Temp: 36.8 C (98.3 F)  SpO2: 96%  Weight: (!) 133.3 kg (293 lb 12.8 oz)  Height: 185.4 cm (6\' 1" )  PainSc: 0-No pain  PainLoc: Abdomen   Body mass index is 38.76 kg/m.  Alert, calm, cooperative Unlabored respirations  Assessment and Plan:  Diagnoses and all orders for this visit:  LAP-BAND surgery status - X-ray upper GI  Gallbladder polyp - US gallbladder; Future   He would like the Lap-Band removed and I think that that is reasonable given dysphagia symptoms and failure to achieve significant weight loss, 9 years postop at this point. We discussed that given he does have ongoing obesity with comorbidities I would typically recommend converting him to Roux-en-Y gastric bypass but  given his health status at this point I think that he is fairly high risk for this and would recommend we just start with removing the Lap-Band in hopes of improving his quality of life. We discussed the operation and risks involved. We will request cardiac clearance. I will also get an upper GI to evaluate for any other etiology of his dysphagia and we will repeat his gallbladder ultrasound to survey the previously noted gallbladder polyp.  Shenia Alan Carlye Grippe, MD

## 2023-04-15 NOTE — Progress Notes (Signed)
Discharge Progress Report  Patient Details  Name: [name] MRN: [id number] Date of Birth: [birth year] Referring Provider:   Doristine Devoid Pulmonary Rehab Walk Test from 02/27/2023 in [facility]  Referring Provider Hunsucker        Number of Visits: 4  Reason for Discharge:  Early Exit:  Lack of attendance  Smoking History:  Social History   Tobacco Use  Smoking Status Former   Current packs/day: 0.00   Average packs/day: 4.0 packs/day for 20.0 years (80.0 ttl pk-yrs)   Types: Cigarettes   Start date: 07/19/1973   Quit date: 12/13/1991   Years since quitting: 31.3  Smokeless Tobacco Never    Diagnosis:  Dyspnea on exertion  ADL UCSD:  Pulmonary Assessment Scores     Row Name 02/27/23 1416         ADL UCSD   ADL Phase Entry     SOB Score total 76       CAT Score   CAT Score 18       mMRC Score   mMRC Score 4              Initial Exercise Prescription:  Initial Exercise Prescription - 02/27/23 1400       Date of Initial Exercise RX and Referring Provider   Date 02/27/23    Referring Provider Hunsucker    Expected Discharge Date 05/23/23      Bike   Level 2    Minutes 15    METs 1.7      Recumbant Elliptical   Level 2    RPM 70    Minutes 15    METs 1.7      Prescription Details   Frequency (times per week) 2    Duration Progress to 30 minutes of continuous aerobic without signs/symptoms of physical distress      Intensity   THRR 40-80% of Max Heartrate 61-122    Ratings of Perceived Exertion 11-13    Perceived Dyspnea 0-4      Progression   Progression Continue progressive overload as per policy without signs/symptoms or physical distress.      Resistance Training   Training Prescription Yes    Weight blue bands    Reps 10-15             Discharge Exercise Prescription (Final Exercise Prescription Changes):  Exercise Prescription Changes - 04/02/23 1200        Response to Exercise   Blood Pressure (Admit) 96/62    Blood Pressure (Exercise) 132/68    Blood Pressure (Exit) 94/58    Heart Rate (Admit) 70 bpm    Heart Rate (Exercise) 84 bpm    Heart Rate (Exit) 75 bpm    Oxygen Saturation (Admit) 96 %    Oxygen Saturation (Exercise) 96 %    Oxygen Saturation (Exit) 96 %    Rating of Perceived Exertion (Exercise) 12    Perceived Dyspnea (Exercise) 2    Duration Continue with 30 min of aerobic exercise without signs/symptoms of physical distress.    Intensity THRR unchanged      Progression   Progression Continue to progress workloads to maintain intensity without signs/symptoms of physical distress.      Resistance Training   Training Prescription Yes    Weight blue bands    Reps 10-15    Time 10 Minutes      Bike   Level 3  Minutes 15    METs 2.4      Track   Laps 6    Minutes 15    METs 1.92             Functional Capacity:  6 Minute Walk     Row Name 02/27/23 1452         6 Minute Walk   Phase Initial     Distance 1030 feet     Walk Time 6 minutes     # of Rest Breaks 0     MPH 1.95     METS 1.73     RPE 13     Perceived Dyspnea  2.5     VO2 Peak 6.05     Symptoms Yes (comment)     Comments c/o SOB     Resting HR 77 bpm     Resting BP 108/62     Resting Oxygen Saturation  95 %     Exercise Oxygen Saturation  during 6 min walk 94 %     Max Ex. HR 101 bpm     Max Ex. BP 126/56     2 Minute Post BP 108/56       Interval HR   1 Minute HR 87     2 Minute HR 91     3 Minute HR 96     4 Minute HR 101     5 Minute HR 101     6 Minute HR 100     2 Minute Post HR 80     Interval Heart Rate? Yes       Interval Oxygen   Interval Oxygen? Yes     Baseline Oxygen Saturation % 95 %     1 Minute Oxygen Saturation % 95 %     1 Minute Liters of Oxygen 0 L     2 Minute Oxygen Saturation % 95 %     2 Minute Liters of Oxygen 0 L     3 Minute Oxygen Saturation % 93 %     3 Minute Liters of Oxygen 0 L      4 Minute Oxygen Saturation % 94 %     4 Minute Liters of Oxygen 0 L     5 Minute Oxygen Saturation % 94 %     5 Minute Liters of Oxygen 0 L     6 Minute Oxygen Saturation % 94 %     6 Minute Liters of Oxygen 0 L     2 Minute Post Oxygen Saturation % 98 %     2 Minute Post Liters of Oxygen 0 L              Psychological, QOL, Others - Outcomes: PHQ 2/9:    03/06/2023    9:27 AM 02/27/2023    2:16 PM 01/22/2023    2:03 PM 12/04/2022   10:45 AM 09/03/2022   10:11 AM  Depression screen PHQ 2/9  Decreased Interest 0 0 0 0 0  Down, Depressed, Hopeless 0 0 0 0 0  PHQ - 2 Score 0 0 0 0 0  Altered sleeping 2 1     Tired, decreased energy  0     Change in appetite 1 1     Feeling bad or failure about yourself  0 1     Trouble concentrating 0 0     Moving slowly or fidgety/restless 0 0     Suicidal thoughts 0 0  PHQ-9 Score 3 3     Difficult doing work/chores Not difficult at all Somewhat difficult       Quality of Life:   Personal Goals: Goals established at orientation with interventions provided to work toward goal.  Personal Goals and Risk Factors at Admission - 02/27/23 1335       Core Components/Risk Factors/Patient Goals on Admission    Weight Management Weight Loss;Yes    Intervention Weight Management: Develop a combined nutrition and exercise program designed to reach desired caloric intake, while maintaining appropriate intake of nutrient and fiber, sodium and fats, and appropriate energy expenditure required for the weight goal.;Weight Management: Provide education and appropriate resources to help participant work on and attain dietary goals.;Weight Management/Obesity: Establish reasonable short term and long term weight goals.;Obesity: Provide education and appropriate resources to help participant work on and attain dietary goals.    Expected Outcomes Short Term: Continue to assess and modify interventions until short term weight is achieved;Long Term: Adherence  to nutrition and physical activity/exercise program aimed toward attainment of established weight goal;Weight Loss: Understanding of general recommendations for a balanced deficit meal plan, which promotes 1-2 lb weight loss per week and includes a negative energy balance of [phone] kcal/d;Understanding recommendations for meals to include 15-35% energy as protein, 25-35% energy from fat, 35-60% energy from carbohydrates, less than 200mg  of dietary cholesterol, 20-35 gm of total fiber daily;Understanding of distribution of calorie intake throughout the day with the consumption of 4-5 meals/snacks    Improve shortness of breath with ADL's Yes    Intervention Provide education, individualized exercise plan and daily activity instruction to help decrease symptoms of SOB with activities of daily living.    Expected Outcomes Short Term: Improve cardiorespiratory fitness to achieve a reduction of symptoms when performing ADLs;Long Term: Be able to perform more ADLs without symptoms or delay the onset of symptoms    Intervention Provide education about signs/symptoms and action to take for hypo/hyperglycemia.;Provide education about proper nutrition, including hydration, and aerobic/resistive exercise prescription along with prescribed medications to achieve blood glucose in normal ranges: Fasting glucose 65-99 mg/dL    Expected Outcomes Short Term: Participant verbalizes understanding of the signs/symptoms and immediate care of hyper/hypoglycemia, proper foot care and importance of medication, aerobic/resistive exercise and nutrition plan for blood glucose control.;Long Term: Attainment of HbA1C < 7%.              Personal Goals Discharge:  Goals and Risk Factor Review     Row Name 03/11/23 1436 04/08/23 1448 04/15/23 0952         Core Components/Risk Factors/Patient Goals Review   Personal Goals Review Weight Management/Obesity;Improve shortness of breath with ADL's;Develop more efficient breathing  techniques such as purse lipped breathing and diaphragmatic breathing and practicing self-pacing with activity.;Diabetes Weight Management/Obesity;Improve shortness of breath with ADL's;Develop more efficient breathing techniques such as purse lipped breathing and diaphragmatic breathing and practicing self-pacing with activity.;Diabetes Weight Management/Obesity;Improve shortness of breath with ADL's;Develop more efficient breathing techniques such as purse lipped breathing and diaphragmatic breathing and practicing self-pacing with activity.;Diabetes     Review Davarius has only attended 2 sessions. His goal is progressing for weight loss. Jamair is working with our dietician for weight loss goals. Goal progressing on improving his shortness of breath with ADLs. Goal progressing on developing more efficient breathing techniques such as purse lipped breathing and diaphragmatic breathing; and practicing self-pacing with activity. Goal progressing for diabetes. Ivann's blood sugars have been ranging 270's-320's. We will continue  to monitor his blood sugars throughout the program. Jhovanni also complains of foot sores that are almost healed, but he is reluctant to do any type of exercise that involve moving his feet. Jervis has only attended 4 sessions. His goal is progressing for weight loss. Graiden is working with our dietician for weight loss goals. Goal progressing on improving his shortness of breath with ADLs. Goal progressing on developing more efficient breathing techniques such as purse lipped breathing and diaphragmatic breathing; and practicing self-pacing with activity. Goal progressing for diabetes. Haruki's blood sugars have been ranging 180-140. We will continue to monitor his blood sugars throughout the program. Jeff has been walking the track and exercising on the bike. These two exercise modalities seem to work with his foot/wound issues. We will continue to monitor Rasmus's  progress throughout the program. Javien has been discharged from the program due to attendance. He completed 4 sessions. Core Components/Risk Factors/Patient Goals Review are as follows: Goal not met for weight loss. Nanayaw gained about 2# since starting the program. Goal not met on improving his shortness of breath with ADLs. Bryken continues to be SOB and is deconditioned. He is not able to do things he once did or would like. Goal not met for developing more efficient breathing techniques such as purse lipped breathing and diaphragmatic breathing; and practicing self-pacing with activity. Eril was not in the program enough for staff to educate and assist with breathing techniques. Goal met for diabetes control while exercising. Meghan's blood sugars have been ranging from 180-140 and were stable post exercise. Gabino states he is compliant with his diabetes medication.     Expected Outcomes See admission goals For Esaiah to lose weight, improve his shortness of breath with ADLs, develop more efficient breathing techniques such as purse lipped breathing and diaphragmatic breathing; and practicing self-pacing with activity and control his blood sugar. For Gaynell Face to lose weight, improve his shortness of breath with ADLs, develop more efficient breathing techniques such as purse lipped breathing and diaphragmatic breathing; and practicing self-pacing with activity and control his blood sugar post PR              Exercise Goals and Review:  Exercise Goals     Row Name 02/27/23 1341             Exercise Goals   Increase Physical Activity Yes       Intervention Provide advice, education, support and counseling about physical activity/exercise needs.;Develop an individualized exercise prescription for aerobic and resistive training based on initial evaluation findings, risk stratification, comorbidities and participant's personal goals.       Expected Outcomes Short Term: Attend  rehab on a regular basis to increase amount of physical activity.;Long Term: Exercising regularly at least 3-5 days a week.;Long Term: Add in home exercise to make exercise part of routine and to increase amount of physical activity.       Increase Strength and Stamina Yes       Intervention Provide advice, education, support and counseling about physical activity/exercise needs.;Develop an individualized exercise prescription for aerobic and resistive training based on initial evaluation findings, risk stratification, comorbidities and participant's personal goals.       Expected Outcomes Short Term: Perform resistance training exercises routinely during rehab and add in resistance training at home;Short Term: Increase workloads from initial exercise prescription for resistance, speed, and METs.;Long Term: Improve cardiorespiratory fitness, muscular endurance and strength as measured by increased METs and functional capacity ( )  Able to understand and use rate of perceived exertion (RPE) scale Yes       Intervention Provide education and explanation on how to use RPE scale       Expected Outcomes Short Term: Able to use RPE daily in rehab to express subjective intensity level;Long Term:  Able to use RPE to guide intensity level when exercising independently       Able to understand and use Dyspnea scale Yes       Intervention Provide education and explanation on how to use Dyspnea scale       Expected Outcomes Short Term: Able to use Dyspnea scale daily in rehab to express subjective sense of shortness of breath during exertion;Long Term: Able to use Dyspnea scale to guide intensity level when exercising independently       Knowledge and understanding of Target Heart Rate Range (THRR) Yes       Intervention Provide education and explanation of THRR including how the numbers were predicted and where they are located for reference       Expected Outcomes Short Term: Able to state/look up  THRR;Long Term: Able to use THRR to govern intensity when exercising independently;Short Term: Able to use daily as guideline for intensity in rehab       Understanding of Exercise Prescription Yes       Intervention Provide education, explanation, and written materials on patient's individual exercise prescription       Expected Outcomes Short Term: Able to explain program exercise prescription;Long Term: Able to explain home exercise prescription to exercise independently                Exercise Goals Re-Evaluation:  Exercise Goals Re-Evaluation     Row Name 03/14/23 0854 04/08/23 1053           Exercise Goal Re-Evaluation   Exercise Goals Review Increase Physical Activity;Able to understand and use Dyspnea scale;Understanding of Exercise Prescription;Increase Strength and Stamina;Knowledge and understanding of Target Heart Rate Range (THRR);Able to understand and use rate of perceived exertion (RPE) scale Increase Physical Activity;Able to understand and use Dyspnea scale;Understanding of Exercise Prescription;Increase Strength and Stamina;Knowledge and understanding of Target Heart Rate Range (THRR);Able to understand and use rate of perceived exertion (RPE) scale      Comments Rennie has completed 1 exercise session. He exercises for 15 min on the upright bike and recumbent elliptical. He averages 2.1 METs at level 3 on the upright bike and 1.0 MET at level 2 on the recumbent elliptical. He performs the warmup and cooldown standing holding onto a chair for balance. Antonius has stated that the recumbent elliptical bothers his toes. He has of history of diabetic neuropathy. Will transfer [facility] from the recumbent elliptical to the arm ergometer. It is too soon to notate any discernable progressions. Will continue to monitor and progress as able. Dolph has completed 4 exercise sessions. He exercises for 15 min on the upright bike and track. He averages 2.5 METs at level 4.1 on the  upright bike and 1.62 METs on the track. He performs the warmup and cooldown standing holding onto a chair for balance. Jerimey has transferred from the recumbent elliptical to the track to increase his strength and stamina when walking. He has complained of toe pain when walking but states that it is tolerable. He has of history of diabetic neuropathy. He has increased his workload for the upright bike as METs have slightly increased. I am unsure of Ad's motivated to exercise as  he missed 2 weeks of class. Will continue to monitor and progress as able.      Expected Outcomes Through exercise at rehab and home, the patient will decrease shortness of breath with daily activities and feel confident in carrying out an exercise regimen at home. Through exercise at rehab and home, the patient will decrease shortness of breath with daily activities and feel confident in carrying out an exercise regimen at home.               Nutrition & Weight - Outcomes:  Pre Biometrics - 02/27/23 1450       Pre Biometrics   Grip Strength 29 kg              Nutrition:  Nutrition Therapy & Goals - 04/02/23 1147       Nutrition Therapy   Diet Heart Healthy/Carbohydrate Consistent Diet    Drug/Food Interactions Statins/Certain Fruits      Personal Nutrition Goals   Nutrition Goal Patient to improve diet quality by using the plate method as a guide for meal planning to include lean protein/plant protein, fruits, vegetables, whole grains, nonfat dairy as part of a well-balanced diet.   goal not met.   Personal Goal #2 Patient to identify strategies for weight loss of 0.5-2.0# per week.   goal not met.   Comments Goals not met. Slone has only attended 3 sessions over the last 30 days. He is up 2.2# since starting with our program. Artavious has medical history of DESx2 RCA, CAD, Abdominal aortic aneurysm, HTN, heart failure, OSA, paraseptal emphysema, fatty liver, DM2, hyperlipidemia, hx of lapband  (currently no fill, surgery in 2015 at 311#), covid-19 long hauler. He has previously participated in cardiac rehab in July 2023. At that time, he met with RD multiple times and remained pre-contemplative toward dietary intervention/lifestyle changes. He does eat out regularly and enjoys a wide variety of foods. He is receptive to decreasing alcohol intake. He reports that cardiac rehab was "not helpful". He continues follow-up with podiatry regarding right foot ulcer. A1c remains elevated at 9.0; he will start mounjaro. LDL is at goal of <55 (30). He does plan to proceed with lap band removal surgery scheduled on 05/14/23. Per notes with [facility], they did discuss conversion to a RNY; however, patient is considered high risk at this time and not a candidate for RNY surger. Patient will benefit from participation in pulmonary rehab for nutrition, exercise, and lifestyle modification.      Intervention Plan   Intervention Prescribe, educate and counsel regarding individualized specific dietary modifications aiming towards targeted core components such as weight, hypertension, lipid management, diabetes, heart failure and other comorbidities.;Nutrition handout(s) given to patient.    Expected Outcomes Short Term Goal: Understand basic principles of dietary content, such as calories, fat, sodium, cholesterol and nutrients.;Long Term Goal: Adherence to prescribed nutrition plan.             Nutrition Discharge:   Education Questionnaire Score:  Knowledge Questionnaire Score - 02/27/23 1420       Knowledge Questionnaire Score   Pre Score 14/18            Zameir has been discharged from the program due to attendance. He completed 4 sessions. Jasiah denied any psychosocial barriers or concerns at this time. Denied any needs at time of discharge.   Core Components/Risk Factors/Patient Goals Review are as follows: Goal not met for weight loss. Jwan gained about 2# since  starting the  program. Goal not met on improving his shortness of breath with ADLs. Ianmichael continues to be SOB and is deconditioned. He is not able to do things he once did or would like. Goal not met for developing more efficient breathing techniques such as purse lipped breathing and diaphragmatic breathing; and practicing self-pacing with activity. Dewon was not in the program enough for staff to educate and assist with breathing techniques. Goal met for diabetes control while exercising. Melquiades's blood sugars have been ranging from 180-140 and were stable post exercise. Benjerman states he is compliant with his diabetes medication.

## 2023-04-16 ENCOUNTER — Encounter (HOSPITAL_COMMUNITY): Payer: Medicare Other

## 2023-04-16 NOTE — Addendum Note (Signed)
Addended by: Ovid Curd R on: 04/16/2023 03:49 PM   Modules accepted: Level of Service

## 2023-04-18 ENCOUNTER — Other Ambulatory Visit: Payer: Self-pay | Admitting: Family Medicine

## 2023-04-18 ENCOUNTER — Encounter (HOSPITAL_COMMUNITY): Payer: Medicare Other

## 2023-04-23 ENCOUNTER — Encounter (HOSPITAL_COMMUNITY): Payer: Medicare Other

## 2023-04-25 ENCOUNTER — Encounter (HOSPITAL_COMMUNITY): Payer: Medicare Other

## 2023-04-30 ENCOUNTER — Encounter (HOSPITAL_COMMUNITY): Payer: Medicare Other

## 2023-05-02 ENCOUNTER — Encounter (HOSPITAL_COMMUNITY): Payer: Medicare Other

## 2023-05-03 NOTE — Progress Notes (Addendum)
COVID Vaccine received:  []  No [x]  Yes Date of any COVID positive Test in last 90 days: no PCP - Jacquiline Doe MD Cardiologist - Riley Lam MD  Chest x-ray -  EKG -  07/17/22 Epic Stress Test - 11/08/15 Epic ECHO - 02/27/22 Epic Cardiac Cath - 07/18/21 Epic  Cardiac clearance-Tessa Asa Lente- 03/05/23  Bowel Prep - [x]  No  []   Yes ______  Pacemaker / ICD device [x]  No []  Yes   Spinal Cord Stimulator:[x]  No []  Yes       History of Sleep Apnea? []  No [x]  Yes   CPAP used?- []  No [x]  Yes    Does the patient monitor blood sugar?          []  No [x]  Yes  []  N/A  Patient has: []  NO Hx DM   []  Pre-DM                 []  DM1  [x]   DM2 Does patient have a [employer] or Dexacom? []  No []  Yes   Fasting Blood Sugar Ranges- 120's Checks Blood Sugar _____ times a day  GLP1 agonist / usual dose - no GLP1 instructions:  SGLT-2 inhibitors / usual dose - no SGLT-2 instructions:   Blood Thinner / Instructions:no Aspirin Instructions:81 mg ASA Hold 5-7 days  Comments:   Activity level: Patient is able to climb a flight of stairs without difficulty; [x]  No CP  []  No SOB,  ___   Patient can perform ADLs without assistance.   Anesthesia review: Cad, stents, HTN, OSA, DM2, AAA, Took ASA up until today.  Patient denies shortness of breath, fever, cough and chest pain at PAT appointment.  Patient verbalized understanding and agreement to the Pre-Surgical Instructions that were given to them at this PAT appointment. Patient was also educated of the need to review these PAT instructions again prior to his/her surgery.I reviewed the appropriate phone numbers to call if they have any and questions or concerns.

## 2023-05-03 NOTE — Patient Instructions (Addendum)
SURGICAL WAITING ROOM VISITATION  Patients having surgery or a procedure may have no more than 2 support people in the waiting area - these visitors may rotate.    Children under the age of 74 must have an adult with them who is not the patient.  Due to an increase in RSV and influenza rates and associated hospitalizations, children ages 67 and under may not visit patients in [facility] hospitals.  If the patient needs to stay at the hospital during part of their recovery, the visitor guidelines for inpatient rooms apply. Pre-op nurse will coordinate an appropriate time for 1 support person to accompany patient in pre-op.  This support person may not rotate.    Please refer to the [facility] website for the visitor guidelines for Inpatients (after your surgery is over and you are in a regular room).       Your procedure is scheduled on: 05/14/23   Report to [facility] Main Entrance    Report to admitting at AM   Call this number if you have problems the morning of surgery [phone]   Do not eat food  or drink liquids:After Midnight.                                    Oral Hygiene is also important to reduce your risk of infection.                                    Remember - BRUSH YOUR TEETH THE MORNING OF SURGERY WITH YOUR REGULAR TOOTHPASTE   Stop all vitamins and herbal supplements 7 days before surgery.   Take these medicines the morning of surgery with A SIP OF WATER: Tylenol, Carizem, Loratadine, Rosuvastatin, Tamsulosin, Torsemide  DO NOT TAKE ANY ORAL DIABETIC MEDICATIONS DAY OF YOUR SURGERY Take half dose of Tresiba the day of surgery.  Bring CPAP mask and tubing day of surgery.                              You may not have any metal on your body including hair pins, jewelry, and body piercing             Do not wear make-up, lotions, powders, perfumes/cologne, or deodorant  Do not wear nail polish including gel and S&S, artificial/acrylic nails, or  any other type of covering on natural nails including finger and toenails. If you have artificial nails, gel coating, etc. that needs to be removed by a nail salon please have this removed prior to surgery or surgery may need to be canceled/ delayed if the surgeon/ anesthesia feels like they are unable to be safely monitored.   Do not shave  48 hours prior to surgery.               Men may shave face and neck.   Do not bring valuables to the hospital. [facility] IS NOT             RESPONSIBLE   FOR VALUABLES.   Contacts, glasses, dentures or bridgework may not be worn into surgery.  DO NOT BRING YOUR HOME MEDICATIONS TO THE HOSPITAL. PHARMACY WILL DISPENSE MEDICATIONS LISTED ON YOUR MEDICATION LIST TO YOU DURING YOUR ADMISSION IN THE HOSPITAL!    Patients discharged on  the day of surgery will not be allowed to drive home.  Someone NEEDS to stay with you for the first 24 hours after anesthesia.   Special Instructions: Bring a copy of your healthcare power of attorney and living will documents the day of surgery if you haven't scanned them before.              Please read over the following fact sheets you were given: IF YOU HAVE QUESTIONS ABOUT YOUR PRE-OP INSTRUCTIONS PLEASE CALL [phone] Rosey [place]   If you received a COVID test during your pre-op visit  it is requested that you wear a mask when out in public, stay away from anyone that may not be feeling well and notify your surgeon if you develop symptoms. If you test positive for Covid or have been in contact with anyone that has tested positive in the last 10 days please notify you surgeon.    [facility] - Preparing for Surgery Before surgery, you can play an important role.  Because skin is not sterile, your skin needs to be as free of germs as possible.  You can reduce the number of germs on your skin by washing with CHG (chlorahexidine gluconate) soap before surgery.  CHG is an antiseptic cleaner which kills germs and bonds with the  skin to continue killing germs even after washing. Please DO NOT use if you have an allergy to CHG or antibacterial soaps.  If your skin becomes reddened/irritated stop using the CHG and inform your nurse when you arrive at Short Stay. Do not shave (including legs and underarms) for at least 48 hours prior to the first CHG shower.  You may shave your face/neck.  Please follow these instructions carefully:  1.  Shower with CHG Soap the night before surgery and the  morning of surgery.  2.  If you choose to wash your hair, wash your hair first as usual with your normal  shampoo.  3.  After you shampoo, rinse your hair and body thoroughly to remove the shampoo.                             4.  Use CHG as you would any other liquid soap.  You can apply chg directly to the skin and wash.  Gently with a scrungie or clean washcloth.  5.  Apply the CHG Soap to your body ONLY FROM THE NECK DOWN.   Do   not use on face/ open                           Wound or open sores. Avoid contact with eyes, ears mouth and   genitals (private parts).                       Wash face,  Genitals (private parts) with your normal soap.             6.  Wash thoroughly, paying special attention to the area where your    surgery  will be performed.  7.  Thoroughly rinse your body with warm water from the neck down.  8.  DO NOT shower/wash with your normal soap after using and rinsing off the CHG Soap.                9.  Pat yourself dry with a clean towel.  10.  Wear clean pajamas.            11.  Place clean sheets on your bed the night of your first shower and do not  sleep with pets. Day of Surgery : Do not apply any lotions/deodorants the morning of surgery.  Please wear clean clothes to the hospital/surgery center.  FAILURE TO FOLLOW THESE INSTRUCTIONS MAY RESULT IN THE CANCELLATION OF YOUR SURGERY  PATIENT SIGNATURE_________________________________  NURSE  SIGNATURE__________________________________  ________________________________________________________________________

## 2023-05-07 ENCOUNTER — Encounter (HOSPITAL_COMMUNITY): Payer: Medicare Other

## 2023-05-09 ENCOUNTER — Encounter (HOSPITAL_COMMUNITY): Payer: Medicare Other

## 2023-05-13 ENCOUNTER — Encounter (HOSPITAL_COMMUNITY): Payer: Self-pay

## 2023-05-13 ENCOUNTER — Other Ambulatory Visit: Payer: Self-pay

## 2023-05-13 ENCOUNTER — Encounter (HOSPITAL_COMMUNITY)
Admission: RE | Admit: 2023-05-13 | Discharge: 2023-05-13 | Disposition: A | Discharge status: Home / Self Care | Payer: Medicare Other | Source: Ambulatory Visit | Attending: Surgery | Admitting: Surgery

## 2023-05-13 VITALS — BP 111/78 | HR 72 | Temp 98.3°F | Resp 16 | Ht 72.0 in | Wt 304.0 lb

## 2023-05-13 DIAGNOSIS — R0609 Other forms of dyspnea: Secondary | ICD-10-CM | POA: Insufficient documentation

## 2023-05-13 DIAGNOSIS — Z794 Long term (current) use of insulin: Secondary | ICD-10-CM | POA: Insufficient documentation

## 2023-05-13 DIAGNOSIS — E1136 Type 2 diabetes mellitus with diabetic cataract: Secondary | ICD-10-CM | POA: Diagnosis not present

## 2023-05-13 DIAGNOSIS — I2510 Atherosclerotic heart disease of native coronary artery without angina pectoris: Secondary | ICD-10-CM | POA: Insufficient documentation

## 2023-05-13 DIAGNOSIS — E119 Type 2 diabetes mellitus without complications: Secondary | ICD-10-CM

## 2023-05-13 DIAGNOSIS — Z01812 Encounter for preprocedural laboratory examination: Secondary | ICD-10-CM | POA: Diagnosis present

## 2023-05-13 DIAGNOSIS — I10 Essential (primary) hypertension: Secondary | ICD-10-CM | POA: Insufficient documentation

## 2023-05-13 DIAGNOSIS — Z7982 Long term (current) use of aspirin: Secondary | ICD-10-CM | POA: Insufficient documentation

## 2023-05-13 DIAGNOSIS — Z87891 Personal history of nicotine dependence: Secondary | ICD-10-CM | POA: Diagnosis not present

## 2023-05-13 LAB — BASIC METABOLIC PANEL
Anion gap: 9 (ref 5–15)
BUN: 18 mg/dL (ref 8–23)
CO2: 28 mmol/L (ref 22–32)
Calcium: 8.6 mg/dL — ABNORMAL LOW (ref 8.9–10.3)
Chloride: 97 mmol/L — ABNORMAL LOW (ref 98–111)
Creatinine, Ser: 1.44 mg/dL — ABNORMAL HIGH (ref 0.61–1.24)
GFR, Estimated: 53 mL/min — ABNORMAL LOW (ref >60)
Glucose, Bld: 193 mg/dL — ABNORMAL HIGH (ref 70–99)
Potassium: 3.7 mmol/L (ref 3.5–5.1)
Sodium: 134 mmol/L — ABNORMAL LOW (ref 135–145)

## 2023-05-13 LAB — CBC
HCT: 42.2 % (ref 39.0–52.0)
Hemoglobin: 14.6 g/dL (ref 13.0–17.0)
MCH: 32.4 pg (ref 26.0–34.0)
MCHC: 34.6 g/dL (ref 30.0–36.0)
MCV: 93.8 fL (ref 80.0–100.0)
Platelets: 186 10*3/uL (ref 150–400)
RBC: 4.5 MIL/uL (ref 4.22–5.81)
RDW: 12.8 % (ref 11.5–15.5)
WBC: 5.8 10*3/uL (ref 4.0–10.5)
nRBC: 0 % (ref 0.0–0.2)

## 2023-05-13 LAB — HEMOGLOBIN A1C
Hgb A1c MFr Bld: 9.4 % — ABNORMAL HIGH (ref 4.8–5.6)
Mean Plasma Glucose: 223.08 mg/dL

## 2023-05-13 LAB — GLUCOSE, CAPILLARY: Glucose-Capillary: 213 mg/dL — ABNORMAL HIGH (ref 70–99)

## 2023-05-13 NOTE — Progress Notes (Signed)
Anesthesia Chart Review   Case: [id number] Date/Time: 05/14/23 0945   Procedure: LAPAROSCOPIC REMOVAL OF GASTRIC BAND   Anesthesia type: General   Pre-op diagnosis: LAP BAND IN PLACE   Location[facility] ROOM 01 / [facility]   Surgeons: Berna Bue, MD       DISCUSSION:67 y.o. former smoker with h/o HTN, sleep apnea, CAD, DM II, lap band in place scheduled for above procedure 05/14/2023 with Dr. Phylliss Blakes.   Per cardiology preoperative evaluation 03/05/2023, "Chart reviewed as part of pre-operative protocol coverage. Pre-op clearance already addressed by colleagues in earlier phone notes. To summarize recommendations:   This is the second best he has been in the years I have seen him.  He needs to stop drinking prior to surgery.  But I predict he will do well. -Dr. Izora Ribas    No further cardiac testing indicated at this time.   On ASA for mild non-obstructive CAD. Can hold for 5-7 days and resume when medically safe to do so."  Pt with chronic dyspnea on exertion.  Followed by pulmonology.  Last seen 02/25/2023. Per OV note, suspect deconditioning and body habitus contribute to DOE. Pt has elevated hemidiaphragm. Referred to pulmonary rehab. Per notes no further workup recommended.   Per previous anesthesia notes in 2015, pt with difficult airway due to neck mobility. Per notes, "Future Recommendations: Recommend- induction with short-acting agent, and alternative techniques readily available Comments: Smooth IV induction. Two handed mask with 9 cm OA. DL x 1 with Mac 4 by SRNA grade 3 view. DL x 1 by MDA, with Mac 4 grade 3 view. Attempt to pass ETT with bougie, unable to pass ETT. DL x 1 with Miller 3, grade 3 view. VL x 1 with glidescope #4 by MDA, Grade 1 view. Successful intubation. Atraumatic oral intubation with 8.0 ETT. Secured at 23 cm. =BBS and +ETCO2. "  VS: BP 111/78   Pulse 72   Temp 36.8 C (Oral)   Resp 16   Ht 6' (1.829 m)   Wt (!) 137.9 kg   SpO2 98%   BMI 41.23  kg/m   PROVIDERS: Ardith Dark, MD is PCP   Cardiologist - Riley Lam MD   LABS: Labs reviewed: Acceptable for surgery. (all labs ordered are listed, but only abnormal results are displayed)  Labs Reviewed  GLUCOSE, CAPILLARY - Abnormal; Notable for the following components:      Result Value   Glucose-Capillary 213 (*)    All other components within normal limits  HEMOGLOBIN A1C  BASIC METABOLIC PANEL  CBC     IMAGES:   EKG:   CV: Echo 02/27/2022 1. Left ventricular ejection fraction, by estimation, is 60 to 65%. The  left ventricle has normal function. The left ventricle has no regional  wall motion abnormalities. Left ventricular diastolic parameters are  consistent with Grade I diastolic  dysfunction (impaired relaxation). The average left ventricular global  longitudinal strain is -23.7 %. The global longitudinal strain is normal.   2. Right ventricular systolic function is normal. The right ventricular  size is normal.   3. The mitral valve is normal in structure. No evidence of mitral valve  regurgitation. No evidence of mitral stenosis.   4. The aortic valve is tricuspid. There is mild calcification of the  aortic valve. There is mild thickening of the aortic valve. Aortic valve  regurgitation is not visualized. Aortic valve sclerosis is present, with  no evidence of aortic valve stenosis.   5.  The inferior vena cava is normal in size with greater than 50%  respiratory variability, suggesting right atrial pressure of 3 mmHg.   Comparison(s): No significant change from prior study. Prior images  reviewed side by side.   Myocardial Perfusion 11/08/2015 Nuclear stress EF: 47%. The left ventricular ejection fraction is mildly decreased (45-54%). There was no ST segment deviation noted during stress. This is a low risk study. There is no evidence of ischemia or previous infarction Past Medical History:  Diagnosis Date   Abdominal aortic aneurysm  (AAA), 30-34 mm diameter (HCC) 06/01/2017   Nml w/ greatest dm 3 cm US Aorta 06/2016, Abd [place] 05/01/2017 showed infrarenal dilatation at 3.4 cm - rec repeat US in 3 yrs.   Allergy    "pollen"   Arthritis    Cataract    removed,bilateral   Cervical disc disease 12/26/2011   Coronary artery disease involving native coronary artery of native heart without angina pectoris 12/01/2016   He had coronary angiography in 2011. There was irregularity within the LAD with up to 30% narrowing. The myocardial perfusion imaging done 11/07/2015 did not demonstrate any evidence of ischemia with a low risk nuclear stress test other than EF estimated at 47%. Chest CT 01/25/2016 showed diffuse coronary artery calcifications with heavy calcifications in the LAD. Pt seen by cardiology Dr. Mendel Ryder    Diabetes mellitus    Difficult intubation 04/05/2014   Dyslipidemia    Family history of anesthesia complication    pt states took days for his father to awaken after mask was used    Fatty liver 06/01/2017   [place] 05/01/2017   GERD (gastroesophageal reflux disease)    has been having acid reflux since recent endoscopy    H/O hiatal hernia    repair with lap band, now has ventral hernia midline abd   Hearing loss-aides 03/26/2012   Hyperlipidemia    Hypertension    Lapband APS + hiatal hernia repair Oct 2015 04/05/2014   Morbid obesity (HCC)    OSA (obstructive sleep apnea)    Rotator cuff tear    Seasonal allergies    Shortness of breath    pt states related to high BP meds with extended walking or climbling stairs   Sleep apnea     Past Surgical History:  Procedure Laterality Date   BIOPSY  09/20/2022   Procedure: BIOPSY;  Surgeon: Lemar Lofty., MD;  [facility];  Service: Gastroenterology;;   CARDIAC CATHETERIZATION     approx 3 years ago    COLONOSCOPY WITH PROPOFOL N/A 03/10/2015   Procedure: COLONOSCOPY WITH PROPOFOL;  Surgeon: Rachael Fee, MD;  [facility];  Service:  Endoscopy;  Laterality: N/A;   COLONOSCOPY WITH PROPOFOL N/A 09/20/2022   Procedure: COLONOSCOPY WITH PROPOFOL;  Surgeon: Meridee Score Netty Starring., MD;  [facility];  Service: Gastroenterology;  Laterality: N/A;   colonscopy      2012   CORONARY ATHERECTOMY N/A 07/18/2021   Procedure: CORONARY ATHERECTOMY;  Surgeon: Corky Crafts, MD;  Location: [facility] INVASIVE CV LAB;  Service: Cardiovascular;  Laterality: N/A;   CORONARY STENT INTERVENTION N/A 07/18/2021   Procedure: CORONARY STENT INTERVENTION;  Surgeon: Corky Crafts, MD;  Location: [facility] INVASIVE CV LAB;  Service: Cardiovascular;  Laterality: N/A;   ESOPHAGOGASTRODUODENOSCOPY (EGD) WITH PROPOFOL N/A 03/10/2015   Procedure: ESOPHAGOGASTRODUODENOSCOPY (EGD) WITH PROPOFOL;  Surgeon: Rachael Fee, MD;  [facility];  Service: Endoscopy;  Laterality: N/A;   HERNIA REPAIR  with gastric banding   LAPAROSCOPIC GASTRIC BANDING N/A 04/05/2014   Procedure: LAPAROSCOPIC GASTRIC BANDING;  Surgeon: Valarie Merino, MD;  [facility];  Service: General;  Laterality: N/A;   MENISCUS REPAIR Left    left knee torn meniscus   RIGHT/LEFT HEART CATH AND CORONARY ANGIOGRAPHY N/A 07/18/2021   Procedure: RIGHT/LEFT HEART CATH AND CORONARY ANGIOGRAPHY;  Surgeon: Corky Crafts, MD;  Location: [facility] INVASIVE CV LAB;  Service: Cardiovascular;  Laterality: N/A;   VASECTOMY      MEDICATIONS:  acetaminophen (TYLENOL) 500 MG tablet   arformoterol (BROVANA) 15 MCG/2ML NEBU   aspirin EC 81 MG tablet   Cholecalciferol (VITAMIN D) 50 MCG (2000 UT) tablet   diltiazem (CARDIZEM CD) 180 MG 24 hr capsule   diphenhydrAMINE (BENADRYL) 25 MG tablet   ezetimibe (ZETIA) 10 MG tablet   fluticasone (FLONASE) 50 MCG/ACT nasal spray   glucose blood (TRUETEST TEST) test strip   insulin degludec (TRESIBA FLEXTOUCH) 200 UNIT/ML FlexTouch Pen   ketoconazole (NIZORAL) 2 % cream   lisinopril (ZESTRIL) 10 MG tablet   loratadine (CLARITIN) 10 MG  tablet   mupirocin ointment (BACTROBAN) 2 %   NON FORMULARY   Polyethyl Glycol-Propyl Glycol (SYSTANE OP)   potassium chloride SA (KLOR-CON M) 20 MEQ tablet   revefenacin (YUPELRI) 175 MCG/3ML nebulizer solution   rosuvastatin (CRESTOR) 40 MG tablet   silver sulfADIAZINE (SSD) 1 % cream   tamsulosin (FLOMAX) 0.4 MG CAPS capsule   tirzepatide (MOUNJARO) 2.5 MG/0.5ML Pen   torsemide (DEMADEX) 20 MG tablet   No current facility-administered medications for this encounter.     Jodell Cipro Ward, PA-C [facility] [phone]

## 2023-05-13 NOTE — Anesthesia Preprocedure Evaluation (Addendum)
Anesthesia Evaluation  Patient identified by MRN, date of birth, ID band Patient awake    Reviewed: Allergy & Precautions, NPO status , Patient's Chart, lab work & pertinent test results  History of Anesthesia Complications (+) DIFFICULT AIRWAY and history of anesthetic complications (Glidescope needed in 2015)  Airway Mallampati: II  TM Distance: >3 FB Neck ROM: Full    Dental  (+) Missing,    Pulmonary sleep apnea , COPD, former smoker   Pulmonary exam normal        Cardiovascular hypertension, Pt. on medications Normal cardiovascular exam     Neuro/Psych negative neurological ROS     GI/Hepatic Neg liver ROS, hiatal hernia,GERD  ,,  Endo/Other  diabetes, Type 2, Insulin Dependent  Class 3 obesity  Renal/GU negative Renal ROS     Musculoskeletal  (+) Arthritis ,    Abdominal   Peds  Hematology negative hematology ROS (+)   Anesthesia Other Findings Day of surgery medications reviewed with patient.  Reproductive/Obstetrics                              Anesthesia Physical Anesthesia Plan  ASA: 3  Anesthesia Plan: General   Post-op Pain Management: Ofirmev IV (intra-op)*   Induction: Intravenous  PONV Risk Score and Plan: 2 and Ondansetron, Dexamethasone, Treatment may vary due to age or medical condition and Midazolam  Airway Management Planned: Oral ETT and Video Laryngoscope Planned  Additional Equipment: None  Intra-op Plan:   Post-operative Plan: Extubation in OR  Informed Consent: I have reviewed the patients History and Physical, chart, labs and discussed the procedure including the risks, benefits and alternatives for the proposed anesthesia with the patient or authorized representative who has indicated his/her understanding and acceptance.     Dental advisory given  Plan Discussed with: CRNA  Anesthesia Plan Comments: (See PAT note 05/13/2023)         Anesthesia Quick Evaluation

## 2023-05-13 NOTE — Progress Notes (Signed)
Please review pt's HgbA1c from PST appointment 05/13/23.

## 2023-05-14 ENCOUNTER — Ambulatory Visit (HOSPITAL_COMMUNITY)
Admission: RE | Admit: 2023-05-14 | Discharge: 2023-05-14 | Disposition: A | Discharge status: Home / Self Care | Payer: Medicare Other | Attending: Surgery | Admitting: Surgery

## 2023-05-14 ENCOUNTER — Other Ambulatory Visit: Payer: Self-pay

## 2023-05-14 ENCOUNTER — Encounter (HOSPITAL_COMMUNITY)
Admission: RE | Disposition: A | Discharge status: Home / Self Care | Payer: Self-pay | Source: Home / Self Care | Attending: Surgery

## 2023-05-14 ENCOUNTER — Ambulatory Visit (HOSPITAL_BASED_OUTPATIENT_CLINIC_OR_DEPARTMENT_OTHER): Payer: Medicare Other | Admitting: Certified Registered"

## 2023-05-14 ENCOUNTER — Encounter (HOSPITAL_COMMUNITY): Payer: Self-pay | Admitting: Surgery

## 2023-05-14 ENCOUNTER — Ambulatory Visit (HOSPITAL_COMMUNITY): Payer: Self-pay | Admitting: Certified Registered"

## 2023-05-14 ENCOUNTER — Ambulatory Visit: Payer: Medicare Other | Admitting: Pulmonary Disease

## 2023-05-14 ENCOUNTER — Encounter (HOSPITAL_COMMUNITY): Payer: Medicare Other

## 2023-05-14 DIAGNOSIS — K760 Fatty (change of) liver, not elsewhere classified: Secondary | ICD-10-CM | POA: Insufficient documentation

## 2023-05-14 DIAGNOSIS — E1122 Type 2 diabetes mellitus with diabetic chronic kidney disease: Secondary | ICD-10-CM | POA: Diagnosis not present

## 2023-05-14 DIAGNOSIS — I129 Hypertensive chronic kidney disease with stage 1 through stage 4 chronic kidney disease, or unspecified chronic kidney disease: Secondary | ICD-10-CM | POA: Insufficient documentation

## 2023-05-14 DIAGNOSIS — K219 Gastro-esophageal reflux disease without esophagitis: Secondary | ICD-10-CM | POA: Insufficient documentation

## 2023-05-14 DIAGNOSIS — I2510 Atherosclerotic heart disease of native coronary artery without angina pectoris: Secondary | ICD-10-CM | POA: Diagnosis not present

## 2023-05-14 DIAGNOSIS — Z7984 Long term (current) use of oral hypoglycemic drugs: Secondary | ICD-10-CM | POA: Insufficient documentation

## 2023-05-14 DIAGNOSIS — Z794 Long term (current) use of insulin: Secondary | ICD-10-CM | POA: Diagnosis not present

## 2023-05-14 DIAGNOSIS — K449 Diaphragmatic hernia without obstruction or gangrene: Secondary | ICD-10-CM | POA: Insufficient documentation

## 2023-05-14 DIAGNOSIS — Z9884 Bariatric surgery status: Secondary | ICD-10-CM | POA: Diagnosis not present

## 2023-05-14 DIAGNOSIS — Z86711 Personal history of pulmonary embolism: Secondary | ICD-10-CM | POA: Diagnosis not present

## 2023-05-14 DIAGNOSIS — E785 Hyperlipidemia, unspecified: Secondary | ICD-10-CM | POA: Diagnosis not present

## 2023-05-14 DIAGNOSIS — Z6841 Body Mass Index (BMI) 40.0 and over, adult: Secondary | ICD-10-CM | POA: Insufficient documentation

## 2023-05-14 DIAGNOSIS — K824 Cholesterolosis of gallbladder: Secondary | ICD-10-CM | POA: Insufficient documentation

## 2023-05-14 DIAGNOSIS — Z8616 Personal history of COVID-19: Secondary | ICD-10-CM | POA: Diagnosis not present

## 2023-05-14 DIAGNOSIS — Z4651 Encounter for fitting and adjustment of gastric lap band: Secondary | ICD-10-CM

## 2023-05-14 DIAGNOSIS — E669 Obesity, unspecified: Secondary | ICD-10-CM | POA: Insufficient documentation

## 2023-05-14 DIAGNOSIS — N189 Chronic kidney disease, unspecified: Secondary | ICD-10-CM | POA: Insufficient documentation

## 2023-05-14 DIAGNOSIS — Z87891 Personal history of nicotine dependence: Secondary | ICD-10-CM | POA: Insufficient documentation

## 2023-05-14 DIAGNOSIS — E119 Type 2 diabetes mellitus without complications: Secondary | ICD-10-CM

## 2023-05-14 DIAGNOSIS — G4730 Sleep apnea, unspecified: Secondary | ICD-10-CM | POA: Insufficient documentation

## 2023-05-14 DIAGNOSIS — J449 Chronic obstructive pulmonary disease, unspecified: Secondary | ICD-10-CM | POA: Diagnosis not present

## 2023-05-14 DIAGNOSIS — R1310 Dysphagia, unspecified: Secondary | ICD-10-CM | POA: Insufficient documentation

## 2023-05-14 LAB — GLUCOSE, CAPILLARY
Glucose-Capillary: 172 mg/dL — ABNORMAL HIGH (ref 70–99)
Glucose-Capillary: 153 mg/dL — ABNORMAL HIGH (ref 70–99)

## 2023-05-14 SURGERY — REMOVAL, GASTRIC BAND, LAPAROSCOPIC
Anesthesia: General

## 2023-05-14 MED ORDER — FENTANYL CITRATE (PF) 100 MCG/2ML IJ SOLN
INTRAMUSCULAR | Status: AC
  Filled 2023-05-14: qty 2

## 2023-05-14 MED ORDER — BUPIVACAINE HCL (PF) 0.25 % IJ SOLN
INTRAMUSCULAR | Status: DC | PRN
  Administered 2023-05-14: 30 mL

## 2023-05-14 MED ORDER — PHENYLEPHRINE HCL-NACL 20-0.9 MG/250ML-% IV SOLN
INTRAVENOUS | Status: DC | PRN
  Administered 2023-05-14: 40 ug/min via INTRAVENOUS

## 2023-05-14 MED ORDER — TRAMADOL HCL 50 MG PO TABS: 50.0000 mg | ORAL_TABLET | Freq: Four times a day (QID) | ORAL | 0 refills | Status: AC | PRN

## 2023-05-14 MED ORDER — BUPIVACAINE HCL (PF) 0.25 % IJ SOLN
INTRAMUSCULAR | Status: AC
  Filled 2023-05-14: qty 30

## 2023-05-14 MED ORDER — LACTATED RINGERS IV SOLN: INTRAVENOUS | Status: DC

## 2023-05-14 MED ORDER — ORAL CARE MOUTH RINSE: 15.0000 mL | Freq: Once | OROMUCOSAL | Status: AC

## 2023-05-14 MED ORDER — INSULIN ASPART 100 UNIT/ML IJ SOLN
0.0000 [IU] | INTRAMUSCULAR | Status: DC | PRN
  Administered 2023-05-14: 2 [IU] via SUBCUTANEOUS
  Filled 2023-05-14: qty 1

## 2023-05-14 MED ORDER — CHLORHEXIDINE GLUCONATE 0.12 % MT SOLN
15.0000 mL | Freq: Once | OROMUCOSAL | Status: AC
  Administered 2023-05-14: 15 mL via OROMUCOSAL

## 2023-05-14 MED ORDER — LIDOCAINE 2% (20 MG/ML) 5 ML SYRINGE
INTRAMUSCULAR | Status: DC | PRN
  Administered 2023-05-14: 100 mg via INTRAVENOUS

## 2023-05-14 MED ORDER — ONDANSETRON HCL 4 MG/2ML IJ SOLN
INTRAMUSCULAR | Status: AC
  Filled 2023-05-14: qty 2

## 2023-05-14 MED ORDER — PROPOFOL 10 MG/ML IV BOLUS
INTRAVENOUS | Status: DC | PRN
  Administered 2023-05-14: 180 mg via INTRAVENOUS

## 2023-05-14 MED ORDER — DEXAMETHASONE SODIUM PHOSPHATE 10 MG/ML IJ SOLN
INTRAMUSCULAR | Status: DC | PRN
  Administered 2023-05-14: 4 mg via INTRAVENOUS

## 2023-05-14 MED ORDER — FENTANYL CITRATE (PF) 100 MCG/2ML IJ SOLN
INTRAMUSCULAR | Status: DC | PRN
  Administered 2023-05-14: 50 ug via INTRAVENOUS
  Administered 2023-05-14: 100 ug via INTRAVENOUS

## 2023-05-14 MED ORDER — SUGAMMADEX SODIUM 200 MG/2ML IV SOLN
INTRAVENOUS | Status: DC | PRN
  Administered 2023-05-14: 280 mg via INTRAVENOUS

## 2023-05-14 MED ORDER — DROPERIDOL 2.5 MG/ML IJ SOLN: 0.6250 mg | Freq: Once | INTRAMUSCULAR | Status: DC | PRN

## 2023-05-14 MED ORDER — MIDAZOLAM HCL 2 MG/2ML IJ SOLN
INTRAMUSCULAR | Status: AC
  Filled 2023-05-14: qty 2

## 2023-05-14 MED ORDER — PROPOFOL 10 MG/ML IV BOLUS
INTRAVENOUS | Status: AC
  Filled 2023-05-14: qty 20

## 2023-05-14 MED ORDER — ROCURONIUM BROMIDE 10 MG/ML (PF) SYRINGE
PREFILLED_SYRINGE | INTRAVENOUS | Status: AC
  Filled 2023-05-14: qty 10

## 2023-05-14 MED ORDER — ROCURONIUM BROMIDE 10 MG/ML (PF) SYRINGE
PREFILLED_SYRINGE | INTRAVENOUS | Status: DC | PRN
  Administered 2023-05-14: 20 mg via INTRAVENOUS
  Administered 2023-05-14: 70 mg via INTRAVENOUS

## 2023-05-14 MED ORDER — CHLORHEXIDINE GLUCONATE 4 % EX SOLN: 60.0000 mL | Freq: Once | CUTANEOUS | Status: DC

## 2023-05-14 MED ORDER — MIDAZOLAM HCL 2 MG/2ML IJ SOLN
INTRAMUSCULAR | Status: DC | PRN
  Administered 2023-05-14: 2 mg via INTRAVENOUS

## 2023-05-14 MED ORDER — CEFAZOLIN IN SODIUM CHLORIDE 3-0.9 GM/100ML-% IV SOLN
3.0000 g | INTRAVENOUS | Status: AC
  Administered 2023-05-14: 3 g via INTRAVENOUS
  Filled 2023-05-14: qty 100

## 2023-05-14 MED ORDER — FENTANYL CITRATE PF 50 MCG/ML IJ SOSY: 25.0000 ug | PREFILLED_SYRINGE | INTRAMUSCULAR | Status: DC | PRN

## 2023-05-14 MED ORDER — ONDANSETRON HCL 4 MG/2ML IJ SOLN
INTRAMUSCULAR | Status: DC | PRN
  Administered 2023-05-14: 4 mg via INTRAVENOUS

## 2023-05-14 MED ORDER — BUPIVACAINE LIPOSOME 1.3 % IJ SUSP: 20.0000 mL | Freq: Once | INTRAMUSCULAR | Status: DC

## 2023-05-14 MED ORDER — ACETAMINOPHEN 500 MG PO TABS
1000.0000 mg | ORAL_TABLET | ORAL | Status: AC
  Administered 2023-05-14: 1000 mg via ORAL
  Filled 2023-05-14: qty 2

## 2023-05-14 MED ORDER — 0.9 % SODIUM CHLORIDE (POUR BTL) OPTIME
TOPICAL | Status: DC | PRN
  Administered 2023-05-14: 1000 mL

## 2023-05-14 MED ORDER — OXYCODONE HCL 5 MG/5ML PO SOLN: 5.0000 mg | Freq: Once | ORAL | Status: DC | PRN

## 2023-05-14 MED ORDER — EPHEDRINE SULFATE-NACL 50-0.9 MG/10ML-% IV SOSY
PREFILLED_SYRINGE | INTRAVENOUS | Status: DC | PRN
  Administered 2023-05-14 (×2): 10 mg via INTRAVENOUS

## 2023-05-14 MED ORDER — LIDOCAINE HCL (PF) 2 % IJ SOLN
INTRAMUSCULAR | Status: AC
  Filled 2023-05-14: qty 5

## 2023-05-14 MED ORDER — DEXAMETHASONE SODIUM PHOSPHATE 10 MG/ML IJ SOLN
INTRAMUSCULAR | Status: AC
  Filled 2023-05-14: qty 1

## 2023-05-14 MED ORDER — DOCUSATE SODIUM 100 MG PO CAPS: 100.0000 mg | ORAL_CAPSULE | Freq: Two times a day (BID) | ORAL | 0 refills | Status: AC

## 2023-05-14 MED ORDER — OXYCODONE HCL 5 MG PO TABS: 5.0000 mg | ORAL_TABLET | Freq: Once | ORAL | Status: DC | PRN

## 2023-05-14 SURGICAL SUPPLY — 46 items
ANTIFOG SOL W/FOAM PAD STRL (MISCELLANEOUS) ×1
BAG COUNTER SPONGE SURGICOUNT (BAG) IMPLANT
BENZOIN TINCTURE PRP APPL 2/3 (GAUZE/BANDAGES/DRESSINGS) IMPLANT
BLADE SURG 15 STRL LF DISP TIS (BLADE) ×1 IMPLANT
BLADE SURG SZ11 CARB STEEL (BLADE) ×1 IMPLANT
BNDG ADH 1X3 SHEER STRL LF (GAUZE/BANDAGES/DRESSINGS) ×6 IMPLANT
CHLORAPREP W/TINT 26 (MISCELLANEOUS) ×1 IMPLANT
DERMABOND ADVANCED .7 DNX12 (GAUZE/BANDAGES/DRESSINGS) IMPLANT
DEVICE SUTURE ENDOST 10MM (ENDOMECHANICALS) IMPLANT
DISSECTOR BLUNT TIP ENDO 5MM (MISCELLANEOUS) IMPLANT
DRAPE UTILITY XL STRL (DRAPES) ×1 IMPLANT
ELECT REM PT RETURN 15FT ADLT (MISCELLANEOUS) ×1 IMPLANT
GLOVE BIO SURGEON STRL SZ7.5 (GLOVE) ×1 IMPLANT
GLOVE INDICATOR 8.0 STRL GRN (GLOVE) ×1 IMPLANT
GOWN STRL REUS W/ TWL XL LVL3 (GOWN DISPOSABLE) ×2 IMPLANT
GRASPER SUT TROCAR 14GX15 (MISCELLANEOUS) IMPLANT
IRRIG SUCT STRYKERFLOW 2 WTIP (MISCELLANEOUS)
IRRIGATION SUCT STRKRFLW 2 WTP (MISCELLANEOUS) IMPLANT
KIT BASIN OR (CUSTOM PROCEDURE TRAY) ×1 IMPLANT
KIT TURNOVER KIT A (KITS) IMPLANT
L-HOOK LAP DISP 36CM (ELECTROSURGICAL) ×1
LHOOK LAP DISP 36CM (ELECTROSURGICAL) IMPLANT
MAT PREVALON FULL STRYKER (MISCELLANEOUS) ×1 IMPLANT
NDL SPNL 22GX3.5 QUINCKE BK (NEEDLE) ×1 IMPLANT
NEEDLE SPNL 22GX3.5 QUINCKE BK (NEEDLE) ×1
NS IRRIG 1000ML POUR BTL (IV SOLUTION) ×1 IMPLANT
PACK UNIVERSAL I (CUSTOM PROCEDURE TRAY) ×1 IMPLANT
PENCIL SMOKE EVACUATOR (MISCELLANEOUS) IMPLANT
SET TUBE SMOKE EVAC HIGH FLOW (TUBING) ×1 IMPLANT
SHEARS HARMONIC 36 ACE (MISCELLANEOUS) IMPLANT
SLEEVE Z-THREAD 5X100MM (TROCAR) ×3 IMPLANT
SOLUTION ANTFG W/FOAM PAD STRL (MISCELLANEOUS) ×1 IMPLANT
SPIKE FLUID TRANSFER (MISCELLANEOUS) ×1 IMPLANT
STRIP CLOSURE SKIN 1/2X4 (GAUZE/BANDAGES/DRESSINGS) IMPLANT
SUT MNCRL AB 4-0 PS2 18 (SUTURE) ×1 IMPLANT
SUT SILK 0 30XBRD TIE 6 (SUTURE) ×1 IMPLANT
SUT VIC AB 2-0 SH 27X BRD (SUTURE) ×1 IMPLANT
SUT VICRYL 0 TIES 12 18 (SUTURE) IMPLANT
SUT VICRYL 0 UR6 27IN ABS (SUTURE) IMPLANT
SYR 20ML LL LF (SYRINGE) ×1 IMPLANT
SYS KII OPTICAL ACCESS 15MM (TROCAR)
SYSTEM KII OPTICAL ACCESS 15MM (TROCAR) IMPLANT
TOWEL OR 17X26 10 PK STRL BLUE (TOWEL DISPOSABLE) ×1 IMPLANT
TOWEL OR NON WOVEN STRL DISP B (DISPOSABLE) ×1 IMPLANT
TROCAR BALLN 12MMX100 BLUNT (TROCAR) IMPLANT
TROCAR Z-THREAD OPTICAL 5X100M (TROCAR) ×1 IMPLANT

## 2023-05-14 NOTE — Transfer of Care (Signed)
Immediate Anesthesia Transfer of Care Note  Patient: [name]  Procedure(s) Performed: LAPAROSCOPIC REMOVAL OF GASTRIC BAND  Patient Location: PACU  Anesthesia Type:General  Level of Consciousness: awake, alert , and patient cooperative  Airway & Oxygen Therapy: Patient Spontanous Breathing and Patient connected to face mask oxygen  Post-op Assessment: Report given to RN and Post -op Vital signs reviewed and stable  Post vital signs: Reviewed and stable  Last Vitals:  Vitals Value Taken Time  BP 128/66 05/14/23 1047  Temp    Pulse 83 05/14/23 1050  Resp 15 05/14/23 1050  SpO2 98 % 05/14/23 1050  Vitals shown include unfiled device data.  Last Pain:  Vitals:   05/14/23 0824  TempSrc:   PainSc: 0-No pain      Patients Stated Pain Goal: 5 (05/14/23 0824)  Complications:  Encounter Notable Events  Notable Event Outcome Phase Comment  Difficult to intubate - expected  Intraprocedure Filed from anesthesia note documentation.

## 2023-05-14 NOTE — Interval H&P Note (Signed)
History and Physical Interval Note:  05/14/2023 8:43 AM  [name]  has presented today for surgery, with the diagnosis of LAP BAND IN PLACE.  The various methods of treatment have been discussed with the patient and family. After consideration of risks, benefits and other options for treatment, the patient has consented to  Procedure(s): LAPAROSCOPIC REMOVAL OF GASTRIC BAND (N/A) as a surgical intervention.  The patient's history has been reviewed, patient examined, no change in status, stable for surgery.  I have reviewed the patient's chart and labs.  Questions were answered to the patient's satisfaction.     [name]

## 2023-05-14 NOTE — Op Note (Signed)
Operative Note  DEMERE CAPEL  [id number]  [id number]  05/14/2023   Surgeon: Phylliss Blakes MD FACS   Assistant: Feliciana Rossetti MD FACS   Procedure performed: laparoscopic removal of lap band   Preop diagnosis: lap band, dysphagia Post-op diagnosis/intraop findings: same   Specimens: no Retained items: no  EBL: minimal cc Complications: none   Description of procedure: After confirming informed consent the patient was taken to the operating room and placed supine on operating room table where general endotracheal anesthesia was initiated, preoperative antibiotics were administered, SCDs applied, and a formal timeout was performed.  The abdomen was prepped and draped in usual sterile fashion.  Peritoneal access was gained with optical entry in left upper quadrant and insufflation to 15 mmHg's without incident.  On gross inspection there is no injury from our entry or gross abnormality.  Under direct visualization the remaining trocars were placed along with the liver retractor to elevate the left lobe.  The Lap-Band was identified and overlying cicatrix divided with cautery.  The Lap-Band was unable to be unbuckled and removed easily.  The tubing was divided and the Lap-Band was removed through our 15 mm right paramedian trocar.  The stomach and surrounding area were inspected and confirmed to be free of injury.  The liver retractor was then removed and the stomach was desufflated.  Our 15 mm trocar site was extended medially and the soft tissues were dissected with cautery until the port was encountered.  The port was dissected free of attachments to the surrounding soft tissue taking with the surrounding cicatrix and securing Prolenes.  Once freed the port and remaining tubing were removed intact.  The fascial defect from our trocar here was closed with a 0 Vicryl.  Hemostasis was ensured within the wound.  The deep soft tissues were reapproximated with interrupted 2-0 Vicryl's.  All skin  incisions were then closed with subcuticular 4-0 Monocryl and Dermabond.  The patient was then awakened, extubated and taken to PACU in stable condition.    All counts were correct at the completion of the case.

## 2023-05-14 NOTE — Anesthesia Postprocedure Evaluation (Signed)
Anesthesia Post Note  Patient: [name]  Procedure(s) Performed: LAPAROSCOPIC REMOVAL OF GASTRIC BAND     Patient location during evaluation: PACU Anesthesia Type: General Level of consciousness: awake and alert Pain management: pain level controlled Vital Signs Assessment: post-procedure vital signs reviewed and stable Respiratory status: spontaneous breathing, nonlabored ventilation and respiratory function stable Cardiovascular status: blood pressure returned to baseline Postop Assessment: no apparent nausea or vomiting Anesthetic complications: yes   Encounter Notable Events  Notable Event Outcome Phase Comment  Difficult to intubate - expected  Intraprocedure Filed from anesthesia note documentation.    Last Vitals:  Vitals:   05/14/23 1130 05/14/23 1145  BP: (!) 102/57 (!) 105/57  Pulse: 67 66  Resp: 11 12  Temp:    SpO2: 92% 95%    Last Pain:  Vitals:   05/14/23 1120  TempSrc:   PainSc: 0-No pain                 Shanda Howells

## 2023-05-14 NOTE — Discharge Instructions (Addendum)
LAPAROSCOPIC SURGERY: POST OP INSTRUCTIONS   EAT Gradually transition to a high fiber diet with a fiber supplement over the next few weeks after discharge.  Start with a pureed / full liquid diet (see below)  WALK Walk an hour a day (cumulative- not all at once).  Control your pain to do that.    CONTROL PAIN Control pain so that you can walk, sleep, tolerate sneezing/coughing, go up/down stairs.  HAVE A BOWEL MOVEMENT DAILY Keep your bowels regular to avoid problems.  OK to try a laxative to override constipation.  OK to use an antidiarrheal to slow down diarrhea.  Call if not better after 2 tries  CALL IF YOU HAVE PROBLEMS/CONCERNS Call if you are still struggling despite following these instructions. Call if you have concerns not answered by these instructions    DIET: Follow a light bland diet & liquids the first 24 hours after arrival home, such as soup, liquids, starches, etc.  Be sure to drink plenty of fluids.  Quickly advance to a usual solid diet within a few days.  Avoid fast food or heavy meals initially as you are more likely to get nauseated or have irregular bowels.  A low-sugar, high-fiber diet for the rest of your life is ideal.  Take your usually prescribed home medications unless otherwise directed.  PAIN CONTROL: Pain is best controlled by a usual combination of three different methods TOGETHER: Ice/Heat Over the counter pain medication Prescription pain medication Most patients will experience some swelling and bruising around the incisions.  Ice packs or heating pads (30-60 minutes up to 6 times a day) will help. Use ice for the first few days to help decrease swelling and bruising, then switch to heat to help relax tight/sore spots and speed recovery.  Some people prefer to use ice alone, heat alone, alternating between ice & heat.  Experiment to what works for you.  Swelling and bruising can take several weeks to resolve.   It is helpful to take an  over-the-counter pain medication regularly for the first few days: Naproxen (Aleve, etc)  Two 220mg  tabs twice a day OR Ibuprofen (Advil, etc) Three 200mg  tabs four times a day (every meal & bedtime) AND Acetaminophen (Tylenol, etc) 500-650mg  four times a day (every meal & bedtime) A  prescription for pain medication (such as oxycodone, hydrocodone, tramadol, gabapentin, methocarbamol, etc) should be given to you upon discharge.  Take your pain medication as prescribed, IF NEEDED.  If you are having problems/concerns with the prescription medicine (does not control pain, nausea, vomiting, rash, itching, etc), please call us [phone] to see if we need to switch you to a different pain medicine that will work better for you and/or control your side effect better. If you need a refill on your pain medication, please give [place] 48 hour notice.  contact your pharmacy.  They will contact our office to request authorization. Prescriptions will not be filled after 5 pm or on week-ends  Avoid getting constipated.   Between the surgery and the pain medications, it is common to experience some constipation.   Increasing fluid intake and taking a fiber supplement (such as Metamucil, Citrucel, FiberCon, MiraLax, etc) 1-2 times a day regularly will usually help prevent this problem from occurring.   A mild laxative (prune juice, Milk of Magnesia, MiraLax, etc) should be taken according to package directions if there are no bowel movements after 48 hours.   Watch out for diarrhea.   If you have many  loose bowel movements, simplify your diet to bland foods & liquids for a few days.   Stop any stool softeners and decrease your fiber supplement.   Switching to mild anti-diarrheal medications (Kayopectate, Pepto Bismol) can help.   If this worsens or does not improve, please call us.  Wash / shower every day.  You may shower over the skin glue which is waterproof.  Do not soak or submerge incisions.  No rubbing,  scrubbing, lotions or ointments to incisions.  Glue will flake off after about 2 weeks.  You may leave the incision open to air.  You may replace a dressing/Band-Aid to cover the incision for comfort if you wish.   ACTIVITIES as tolerated:   You may resume regular (light) daily activities beginning the next day--such as daily self-care, walking, climbing stairs--gradually increasing activities as tolerated.  If you can walk 30 minutes without difficulty, it is safe to try more intense activity such as jogging, treadmill, bicycling, low-impact aerobics, swimming, etc. Save the most intensive and strenuous activity for last such as sit-ups, heavy lifting, contact sports, etc  Refrain from any heavy lifting or straining until you are off narcotics for pain control.   DO NOT PUSH THROUGH PAIN.  Let pain be your guide: If it hurts to do something, don't do it.  Pain is your body warning you to avoid that activity for another week until the pain goes down. You may drive when you are no longer taking prescription pain medication, you can comfortably wear a seatbelt, and you can safely maneuver your car and apply brakes. You may have sexual intercourse when it is comfortable.  FOLLOW UP in our office Please call [facility] at [phone] to set up an appointment to see your surgeon in the office for a follow-up appointment approximately 2-3 weeks after your surgery. Make sure that you call for this appointment the day you arrive home to insure a convenient appointment time.  10. IF YOU HAVE DISABILITY OR FAMILY LEAVE FORMS, BRING THEM TO THE OFFICE FOR PROCESSING.  DO NOT GIVE THEM TO YOUR DOCTOR.   WHEN TO CALL us [phone]: Poor pain control Reactions / problems with new medications (rash/itching, nausea, etc)  Fever over 101.5 F (38.5 C) Inability to urinate Nausea and/or vomiting Worsening swelling or bruising Continued bleeding from incision. Increased pain, redness, or drainage from the  incision   The clinic staff is available to answer your questions during regular business hours (8:30am-5pm).  Please don't hesitate to call and ask to speak to one of our nurses for clinical concerns.   If you have a medical emergency, go to the nearest emergency room or call 911.  A surgeon from [facility] is always on call at the [facility], [place] [address], [place], [place]  [address] ? MAIN: [phone] ? TOLL FREE: [phone] ?  FAX [phone] [email or web address]

## 2023-05-14 NOTE — Anesthesia Procedure Notes (Addendum)
Procedure Name: Intubation Date/Time: 05/14/2023 9:35 AM  Performed by: Sindy Guadeloupe, CRNAPre-anesthesia Checklist: Patient identified, Emergency Drugs available, Suction available, Patient being monitored and Timeout performed Patient Re-evaluated:Patient Re-evaluated prior to induction Oxygen Delivery Method: Circle system utilized Preoxygenation: Pre-oxygenation with 100% oxygen Induction Type: IV induction Ventilation: Mask ventilation without difficulty and Oral airway inserted - appropriate to patient size Laryngoscope Size: Glidescope and 4 Grade View: Grade I Tube type: Oral Tube size: 7.5 mm Number of attempts: 1 Airway Equipment and Method: Stylet Placement Confirmation: ETT inserted through vocal cords under direct vision, positive ETCO2 and breath sounds checked- equal and bilateral Secured at: 23 cm Tube secured with: Tape Dental Injury: Teeth and Oropharynx as per pre-operative assessment  Difficulty Due To: Difficulty was anticipated

## 2023-05-16 ENCOUNTER — Encounter: Payer: Self-pay | Admitting: Family Medicine

## 2023-05-20 NOTE — Telephone Encounter (Signed)
Can we get more info on why he wants to increase? Please have him schedule a follow up appointment soon.  [name]. [name], MD 05/20/2023 3:11 PM

## 2023-05-20 NOTE — Telephone Encounter (Signed)
Please advise 

## 2023-05-21 ENCOUNTER — Encounter (HOSPITAL_COMMUNITY): Payer: Medicare Other

## 2023-05-22 NOTE — Telephone Encounter (Signed)
Spoke with patient, stated stop taking Jardiance a couple of month ago, glucose been going up from 150-190 morning fasting  Want to know if he can start taking prescription every other day  [place] in the middle of January  Patient has a F/U appt in February with PCP

## 2023-05-23 ENCOUNTER — Encounter (HOSPITAL_COMMUNITY): Payer: Medicare Other

## 2023-05-23 NOTE — Telephone Encounter (Signed)
Patient notified. Had an OV at the end of January

## 2023-05-23 NOTE — Telephone Encounter (Signed)
Yes that is ok but please have him come in soon.  [name]. [name], MD 05/23/2023 12:59 PM

## 2023-05-31 ENCOUNTER — Other Ambulatory Visit: Payer: Self-pay | Admitting: Internal Medicine

## 2023-06-15 ENCOUNTER — Encounter: Payer: Self-pay | Admitting: Podiatry

## 2023-06-21 ENCOUNTER — Other Ambulatory Visit: Payer: Self-pay | Admitting: Podiatry

## 2023-06-21 MED ORDER — DOXYCYCLINE HYCLATE 100 MG PO TABS: 100.0000 mg | ORAL_TABLET | Freq: Two times a day (BID) | ORAL | 0 refills | Status: DC

## 2023-06-23 NOTE — Telephone Encounter (Signed)
 Dawn, can you add him on for Tuesday? I can see him in the afternoon if needed as well.

## 2023-06-24 ENCOUNTER — Ambulatory Visit: Payer: Medicare Other | Admitting: Podiatry

## 2023-06-25 ENCOUNTER — Ambulatory Visit: Payer: Medicare Other | Admitting: Family Medicine

## 2023-06-25 ENCOUNTER — Ambulatory Visit (INDEPENDENT_AMBULATORY_CARE_PROVIDER_SITE_OTHER): Payer: Medicare Other

## 2023-06-25 ENCOUNTER — Ambulatory Visit (INDEPENDENT_AMBULATORY_CARE_PROVIDER_SITE_OTHER): Payer: Medicare Other | Admitting: Podiatry

## 2023-06-25 ENCOUNTER — Encounter: Payer: Self-pay | Admitting: Podiatry

## 2023-06-25 DIAGNOSIS — Z7901 Long term (current) use of anticoagulants: Secondary | ICD-10-CM | POA: Diagnosis not present

## 2023-06-25 DIAGNOSIS — M79674 Pain in right toe(s): Secondary | ICD-10-CM | POA: Diagnosis not present

## 2023-06-25 DIAGNOSIS — M778 Other enthesopathies, not elsewhere classified: Secondary | ICD-10-CM | POA: Diagnosis not present

## 2023-06-25 DIAGNOSIS — L97512 Non-pressure chronic ulcer of other part of right foot with fat layer exposed: Secondary | ICD-10-CM | POA: Diagnosis not present

## 2023-06-25 DIAGNOSIS — B351 Tinea unguium: Secondary | ICD-10-CM

## 2023-06-25 DIAGNOSIS — E1149 Type 2 diabetes mellitus with other diabetic neurological complication: Secondary | ICD-10-CM

## 2023-06-25 DIAGNOSIS — M79675 Pain in left toe(s): Secondary | ICD-10-CM

## 2023-06-25 MED ORDER — SILVER SULFADIAZINE 1 % EX CREA: 1.0000 | TOPICAL_CREAM | Freq: Every day | CUTANEOUS | 0 refills | Status: DC

## 2023-06-25 NOTE — Progress Notes (Signed)
 Subjective: Chief Complaint  Patient presents with   Diabetic Ulcer    RM#11 ulcer on right big toe also needs nail trim    68 year old male presents the office for follow-up evaluation of the wound of the right big toe.  He recently finished antibiotics for sinus redness he states it was.  Denies any drainage or pus.  No fevers or chills.  No pain although he does have neuropathy.   Objective: AAO x3, NAD DP/PT pulses palpable bilaterally, CRT less than 3 seconds On the plantar aspects of bilateral hallux hyperkeratotic tissue with the right side >> left.  On the right side there is dried blood present in the calf.  Upon debridement there is a granular wound present.  There are some localized edema, erythema present to the hallux without any antibiotics.  There is no fluctuation or crepitation.  There is no malodor.  There is no probing, undermining or tunneling of the wound. Nails are hypertrophic, dystrophic, brittle, discolored, elongated 10. No surrounding redness or drainage. Tenderness nails 1-5 bilaterally. No other open lesions or pre-ulcerative lesions are identified today. No pain with calf compression, swelling, warmth, erythema  Assessment: 68 year old male with ulceration right hallux with localized cellulitis, symptomatic onychomycosis   Plan: -All treatment options discussed with the patient including all alternatives, risks, complications.  -X-rays obtained reviewed bilaterally multiple views were obtained.  There is no definitive cortical changes to suggest osteomyelitis at this time.  No soft tissue emphysema. -Medically necessary wound debridement was performed today on the right hallux.  I utilized a #312 with scalpel to sharply debride the hyperkeratotic lesion to reveal the underlying ulceration today.  Prior to debridement not able to measure the wound after debridement measured about 1 x 0.4 x 0.1 cm.  Debrided nonviable, devitalized tissue in order for wound healing.   Wound base is granular.  No blood loss.  Recommend Silvadene  dressing changes daily.  Offloading.  Finish course of antibiotics. -Sharply debrided the nails x 10 without any complications or bleeding a courtesy today. -Monitor for any clinical signs or symptoms of infection and directed to call the office immediately should any occur or go to the ER.  Donnice JONELLE Fees DPM

## 2023-07-03 ENCOUNTER — Other Ambulatory Visit: Payer: Self-pay | Admitting: Podiatry

## 2023-07-03 MED ORDER — DOXYCYCLINE HYCLATE 100 MG PO TABS: 100.0000 mg | ORAL_TABLET | Freq: Two times a day (BID) | ORAL | 0 refills | Status: DC

## 2023-07-04 ENCOUNTER — Other Ambulatory Visit: Payer: Self-pay | Admitting: Family Medicine

## 2023-07-06 ENCOUNTER — Encounter: Payer: Self-pay | Admitting: Family Medicine

## 2023-07-08 NOTE — Telephone Encounter (Signed)
Please see if appt can be move Thanks

## 2023-07-09 ENCOUNTER — Encounter: Payer: Self-pay | Admitting: Podiatry

## 2023-07-09 ENCOUNTER — Ambulatory Visit (INDEPENDENT_AMBULATORY_CARE_PROVIDER_SITE_OTHER): Payer: Medicare Other | Admitting: Podiatry

## 2023-07-09 DIAGNOSIS — L84 Corns and callosities: Secondary | ICD-10-CM | POA: Diagnosis not present

## 2023-07-09 DIAGNOSIS — L97511 Non-pressure chronic ulcer of other part of right foot limited to breakdown of skin: Secondary | ICD-10-CM | POA: Diagnosis not present

## 2023-07-10 ENCOUNTER — Encounter: Payer: Self-pay | Admitting: Family Medicine

## 2023-07-10 ENCOUNTER — Ambulatory Visit: Payer: Medicare Other | Admitting: Pulmonary Disease

## 2023-07-10 VITALS — BP 122/78 | HR 77 | Ht 72.0 in | Wt 301.6 lb

## 2023-07-10 DIAGNOSIS — R052 Subacute cough: Secondary | ICD-10-CM | POA: Diagnosis not present

## 2023-07-10 MED ORDER — PREDNISONE 20 MG PO TABS: ORAL_TABLET | ORAL | 0 refills | Status: AC

## 2023-07-10 NOTE — Progress Notes (Signed)
Subjective: Chief Complaint  Patient presents with   [facility]    RM#67 DFC    68 year old male presents the office for follow-up evaluation of the wound of the right big toe.  He did first course of doxycycline.  He does not report any fevers or chills.  Still some swelling of the right toe, the left side has not seen any drainage or pus.  He has not been keeping any pain over the last couple days as well.  If healed and there is no drainage.  No other concerns.     Objective: AAO x3, NAD DP/PT pulses palpable bilaterally, CRT less than 3 seconds Left: There is mild hyperkeratotic lesion plantar aspect the hallux.  There is no underlying ulceration, drainage or signs of infection.  No erythema or warmth.  No open lesions. Right: Preulcerative hyperkeratotic lesion noted on the plantar aspect the right hallux.  Upon debridement there was a very small superficial pinpoint type opening present but appears to almost completely healed.  There is no surrounding erythema.  This is no edema present of the toe but appears to be improved.  There is no fluctuation or crepitation.  There is no malodor.  There is no other open lesions identified at this time. No pain with calf compression, swelling, warmth, erythema  Assessment: 68 year old male with ulceration right hallux with localized cellulitis, symptomatic onychomycosis   Plan: -All treatment options discussed with the patient including all alternatives, risks, complications.  -On the left side I sharply debrided the hyperkeratotic lesion without any complications or bleeding.  Moisturizer, offloading. -On the right side debrided the preulcerative hyperkeratotic lesion was not irritable with scalpel.  There is 1 small superficial area skin breakdown that appears to almost completely healed.  Silvadene is applied followed by dressing.  I did still continue daily dressing changes.  Continue offloading. -Awaiting inserts. We will follow up on this.   -Monitor for any clinical signs or symptoms of infection and directed to call the office immediately should any occur or go to the ER. -He will be going to [facility] for the month.  Encouraged to contact with any questions concerns or go to the ER/urgent care if needed  No follow-ups on file.  Vivi Barrack DPM

## 2023-07-10 NOTE — Patient Instructions (Signed)
Nice to see you again  Very faint wheeze with forced expiration on the right.  Given your prolonged cough for many weeks likely bronchitis.  Sounds like antibiotics have not helped immensely.  I sent a course of prednisone to help with inflammation and cause wheezing cough and sputum production.  If not getting better in the next week or so let me know and we can consider another round of antibiotics but your lung exam was otherwise clear which is reassuring  Return to clinic as needed

## 2023-07-10 NOTE — Progress Notes (Signed)
@Patient  ID: [name], male    DOB: 11-11-1955, 68 y.o.   MRN: [id number]  Chief Complaint  Patient presents with   Follow-up    DOE (dyspnea on exertion) F/U visit. Pt c/o Cough with green sputum for 3 weeks.     Referring provider: Ardith Dark, MD  HPI:   68 y.o. man with history of CAD, history of COVID infection in the past, presents for evaluation of subacute cough for about 3 to 4 weeks.  Most recent podiatry note reviewed.  Most recent PCP note reviewed.  Returns for evaluation of cough.  Subacute.  Going on 3 weeks.  Productive.  No worsening shortness of breath.  No fever chills etc.  Mild wheeze on exam.  We discussed likely developing bronchitis unclear exactly why likely triggered by some viral infection possibly.  Fortunately wife in room did not get sick.  May be allergy driven.  Notes to be improving with doxycycline which he is taking for foot infection.  Discussed may take some additional weeks to improve.  In an effort to speak recovery improvement discussed prednisone taper.  He expressed understanding and desire to try.  HPI at initial visit: Patient with long history of dyspnea on exertion.  Worse in the couple years ago.  Eventually was found to have coronary disease.  Had stent placed.  This improved for a bit.  But still with residual shortness of breath over the last year to 15 months.  Sometimes get sweaty, breaks out in sweat, she is very short of breath.  Walking long distances.  Or any level of exertion up stairs or carrying things.  Admittedly he is less active over time.  Does have some musculoskeletal issues, arthritis etc. that limits his activity due to pain etc.  Reviewed serial chest images, most recently CT scan 01/2022 are clear without any signs of ILD or otherwise.  He had a PE in the past but completed Xarelto and had subsequent VQ scan that was negative.  Reviewed serial chest x-rays most recently 2023 but dating back to at least 2018 and on my  review interpretation demonstrate clear lungs with elevated left hemidiaphragm suspicious for diaphragmatic dysfunction/paralyzation.  He has not improved with inhalers in the past.  Tried Advair HFA and Diskus, Trelegy.  Discussed trial of Breztri to see if HFA and triple inhaled therapy would be more effective.  Discussed that to date we have no evidence that lungs are contributing to her shortness of breath.  Reviewed PFTs 2022 that are all normal, did show severely reduced ERV and borderline TLC suspicious for contribution of borderline extrathoracic restriction related to habitus versus diaphragmatic paralyzation as discussed above.  Questionaires / Pulmonary Flowsheets:   ACT:      No data to display          MMRC: mMRC Dyspnea Scale mMRC Score  07/27/2021  2:43 PM 3    Epworth:     01/13/2014    9:54 AM  Results of the Epworth flowsheet  Sitting and reading 2  Watching TV 3  Sitting, inactive in a public place (e.g. a theatre or a meeting) 1  As a passenger in a car for an hour without a break 2  Lying down to rest in the afternoon when circumstances permit 2  Sitting and talking to someone 1  Sitting quietly after a lunch without alcohol 0  In a car, while stopped for a few minutes in traffic 0  Total score  11    Tests:   FENO:  No results found for: "NITRICOXIDE"  PFT:    Latest Ref Rng & Units 02/25/2023    8:52 AM 02/15/2021   11:37 AM  PFT Results  FVC-Pre L 2.66  2.98   FVC-Predicted Pre % 54  60   FVC-Post L 2.92  3.33   FVC-Predicted Post % 59  67   Pre FEV1/FVC % % 79  84   Post FEV1/FCV % % 81  84   FEV1-Pre L 2.12  2.49   FEV1-Predicted Pre % 58  67   FEV1-Post L 2.36  2.80   DLCO uncorrected ml/min/mmHg 22.50  30.12   DLCO UNC% % 80  105   DLCO corrected ml/min/mmHg 22.50    DLCO COR %Predicted % 80    DLVA Predicted % 111  143   TLC L 3.67  5.84   TLC % Predicted % 49  78   RV % Predicted % -2  109   Personally reviewed and interpreted  as spirometry suggestive of moderate restriction versus air trapping, no bronchodilator response, borderline but acceptable total lung capacity, severely reduced ERV, severely reduced TLC, normal DLCO  WALK:     02/27/2023    2:52 PM 02/02/2021    3:11 PM  SIX MIN WALK  Supplimental Oxygen during Test? (L/min)  No  2 Minute Oxygen Saturation % 95 %   2 Minute HR 91   4 Minute Oxygen Saturation % 94 %   4 Minute HR 101   6 Minute Oxygen Saturation % 94 %   6 Minute HR 100   Tech Comments:  pt walked at an average pace completing all required laps having complaints of mild SOB.    Imaging: DG Foot Complete Right Result Date: 06/25/2023 Please see detailed radiograph report in office note.  Personally reviewed and as per EMR Lab Results: Personally reviewed CBC    Component Value Date/Time   WBC 5.8 05/13/2023 1043   RBC 4.50 05/13/2023 1043   HGB 14.6 05/13/2023 1043   HGB 15.1 04/25/2022 1405   HGB 14.2 07/14/2021 1543   HCT 42.2 05/13/2023 1043   HCT 42.6 07/14/2021 1543   PLT 186 05/13/2023 1043   PLT 169 04/25/2022 1405   PLT 201 07/14/2021 1543   MCV 93.8 05/13/2023 1043   MCV 95 07/14/2021 1543   MCH 32.4 05/13/2023 1043   MCHC 34.6 05/13/2023 1043   RDW 12.8 05/13/2023 1043   RDW 12.4 07/14/2021 1543   LYMPHSABS 1.6 04/25/2022 1405   LYMPHSABS 1.8 08/15/2018 1001   MONOABS 0.4 04/25/2022 1405   EOSABS 0.1 04/25/2022 1405   EOSABS 0.1 08/15/2018 1001   BASOSABS 0.0 04/25/2022 1405   BASOSABS 0.0 08/15/2018 1001    BMET    Component Value Date/Time   NA 134 (L) 05/13/2023 1043   NA 139 03/26/2023 1132   K 3.7 05/13/2023 1043   CL 97 (L) 05/13/2023 1043   CO2 28 05/13/2023 1043   GLUCOSE 193 (H) 05/13/2023 1043   BUN 18 05/13/2023 1043   BUN 17 03/26/2023 1132   CREATININE 1.44 (H) 05/13/2023 1043   CREATININE 1.47 (H) 04/25/2022 1405   CREATININE 1.33 (H) 01/03/2016 1505   CALCIUM 8.6 (L) 05/13/2023 1043   GFRNONAA 53 (L) 05/13/2023 1043   GFRNONAA  52 (L) 04/25/2022 1405   GFRNONAA 60 10/21/2013 1546   GFRAA 75 08/15/2018 1001   GFRAA 70 10/21/2013 1546    BNP  Component Value Date/Time   BNP 3.6 06/30/2016 1039    ProBNP    Component Value Date/Time   PROBNP 15.0 01/24/2022 1620    Specialty Problems       Pulmonary Problems   OSA (obstructive sleep apnea)   COVID-19 long hauler manifesting chronic dyspnea   Paraseptal emphysema (HCC)   Allergic rhinitis    Allergies  Allergen Reactions   Jardiance [Empagliflozin]     dizziness   Penicillins Nausea Only    Upset stomach    Immunization History  Administered Date(s) Administered   Influenza, High Dose Seasonal PF 04/30/2018   Influenza,inj,Quad PF,6+ Mos 06/30/2016, 03/14/2017   Moderna Sars-Covid-2 Vaccination 10/14/2020   PFIZER(Purple Top)SARS-COV-2 Vaccination 09/10/2019, 10/05/2019   Pneumococcal Polysaccharide-23 10/09/2012   Tdap 02/09/2015    Past Medical History:  Diagnosis Date   Abdominal aortic aneurysm (AAA), 30-34 mm diameter (HCC) 06/01/2017   Nml w/ greatest dm 3 cm US Aorta 06/2016, Abd [place] 05/01/2017 showed infrarenal dilatation at 3.4 cm - rec repeat US in 3 yrs.   Allergy    "pollen"   Arthritis    Cataract    removed,bilateral   Cervical disc disease 12/26/2011   Coronary artery disease involving native coronary artery of native heart without angina pectoris 12/01/2016   He had coronary angiography in 2011. There was irregularity within the LAD with up to 30% narrowing. The myocardial perfusion imaging done 11/07/2015 did not demonstrate any evidence of ischemia with a low risk nuclear stress test other than EF estimated at 47%. Chest CT 01/25/2016 showed diffuse coronary artery calcifications with heavy calcifications in the LAD. Pt seen by cardiology Dr. Mendel Ryder    Diabetes mellitus    Difficult intubation 04/05/2014   Dyslipidemia    Family history of anesthesia complication    pt states took days for his father to awaken  after mask was used    Fatty liver 06/01/2017   [place] 05/01/2017   GERD (gastroesophageal reflux disease)    has been having acid reflux since recent endoscopy    H/O hiatal hernia    repair with lap band, now has ventral hernia midline abd   Hearing loss-aides 03/26/2012   Hyperlipidemia    Hypertension    Lapband APS + hiatal hernia repair Oct 2015 04/05/2014   Morbid obesity (HCC)    OSA (obstructive sleep apnea)    Rotator cuff tear    Seasonal allergies    Shortness of breath    pt states related to high BP meds with extended walking or climbling stairs   Sleep apnea     Tobacco History: Social History   Tobacco Use  Smoking Status Former   Current packs/day: 0.00   Average packs/day: 4.0 packs/day for 20.0 years (80.0 ttl pk-yrs)   Types: Cigarettes   Start date: 07/19/1973   Quit date: 12/13/1991   Years since quitting: 31.5   Passive exposure: Never  Smokeless Tobacco Never   Counseling given: Not Answered   Continue to not smoke  Outpatient Encounter Medications as of 07/10/2023  Medication Sig   acetaminophen (TYLENOL) 500 MG tablet Take 500 mg by mouth every 6 (six) hours as needed for moderate pain (pain score 4-6).   aspirin EC 81 MG tablet Take 81 mg by mouth daily. Swallow whole.   Cholecalciferol (VITAMIN D) 50 MCG (2000 UT) tablet Take 2,000 Units by mouth daily.   diltiazem (CARDIZEM CD) 180 MG 24 hr capsule Take 1 capsule (180 mg total) by  mouth daily.   diphenhydrAMINE (BENADRYL) 25 MG tablet Take 25 mg by mouth every 6 (six) hours as needed for itching or allergies.   doxycycline (VIBRA-TABS) 100 MG tablet Take 1 tablet (100 mg total) by mouth 2 (two) times daily.   ezetimibe (ZETIA) 10 MG tablet Take 10 mg by mouth daily.   fluticasone (FLONASE) 50 MCG/ACT nasal spray Place 1 spray into both nostrils daily as needed for allergies.   glucose blood (TRUETEST TEST) test strip Use as instructed   Homeopathic Products (ZICAM COLD REMEDY PO) Take 1 tablet by  mouth as needed.   ketoconazole (NIZORAL) 2 % cream Apply 1 Application topically daily as needed for irritation.   loratadine (CLARITIN) 10 MG tablet Take 10 mg by mouth daily.   NON FORMULARY Pt use a c-pap night   Polyethyl Glycol-Propyl Glycol (SYSTANE OP) Place 1 drop into both eyes as needed (dry eyes).   potassium chloride SA (KLOR-CON M) 20 MEQ tablet Take 3 tablets (60 mEq total) by mouth daily.   predniSONE (DELTASONE) 20 MG tablet Take 2 tablets (40 mg total) by mouth daily with breakfast for 5 days, THEN 1 tablet (20 mg total) daily with breakfast for 5 days.   rosuvastatin (CRESTOR) 40 MG tablet TAKE ONE TABLET BY MOUTH ONE TIME DAILY   silver sulfADIAZINE (SSD) 1 % cream Apply 1 Application topically daily.   tamsulosin (FLOMAX) 0.4 MG CAPS capsule TAKE ONE CAPSULE BY MOUTH ONCE A DAY 30 MINUTES AFTER THE SAME MEAL EACH DAY   torsemide (DEMADEX) 20 MG tablet Take 3 tablets (60 mg total) by mouth daily.   TRESIBA FLEXTOUCH 200 UNIT/ML FlexTouch Pen INJECT 65 UNITS UNDER THE SKIN TWICE DAILY   lisinopril (ZESTRIL) 10 MG tablet Take 1 tablet (10 mg total) by mouth daily.   [DISCONTINUED] arformoterol (BROVANA) 15 MCG/2ML NEBU Take 2 mLs (15 mcg total) by nebulization 2 (two) times daily. (Patient not taking: Reported on 05/09/2023)   [DISCONTINUED] mupirocin ointment (BACTROBAN) 2 % Apply 1 Application topically daily. Apply 0.5 gm daily to foot ulcer   [DISCONTINUED] revefenacin (YUPELRI) 175 MCG/3ML nebulizer solution Take 3 mLs (175 mcg total) by nebulization daily. (Patient not taking: Reported on 05/09/2023)   [DISCONTINUED] silver sulfADIAZINE (SILVADENE) 1 % cream Apply 1 Application topically daily.   [DISCONTINUED] tirzepatide [facility]) 2.5 MG/0.5ML Pen Inject 2.5 mg into the skin once a week. (Patient not taking: Reported on 05/09/2023)   No facility-administered encounter medications on file as of 07/10/2023.     Review of Systems  Review of Systems  N/a Physical  Exam  BP 122/78 (BP Location: Left Arm, Patient Position: Sitting, Cuff Size: Large)   Pulse 77   Ht 6' (1.829 m)   Wt (!) 301 lb 9.6 oz (136.8 kg)   SpO2 93%   BMI 40.90 kg/m   Wt Readings from Last 5 Encounters:  07/10/23 (!) 301 lb 9.6 oz (136.8 kg)  05/14/23 (!) 304 lb (137.9 kg)  05/13/23 (!) 304 lb (137.9 kg)  04/02/23 (!) 306 lb (138.8 kg)  03/06/23 (!) 306 lb (138.8 kg)    BMI Readings from Last 5 Encounters:  07/10/23 40.90 kg/m  05/14/23 41.23 kg/m  05/13/23 41.23 kg/m  04/02/23 41.50 kg/m  03/06/23 41.50 kg/m     Physical Exam Sitting in chair, no acute distress Eyes: EOMI, no icterus Neck: Supple, no JVP Pulmonary: Distant, clear Cardiovascular: Warm, trace edema bilaterally Abdomen: Distended, soft, bowel sounds present MSK: No synovitis, no joint effusion Neuro:  Normal gait, no weakness Psych: Normal mood, full affect   Assessment & Plan:   Dyspnea on exertion:   Serial chest images clear including chest x-ray and CT scan most recently 01/2022.  Pulmonary function test 2022 within normal limits.  High suspicion for deconditioning and habitus contributing to his symptoms.  He does have elevation of elevated hemidiaphragm and borderline but acceptable total lung capacity, near mild restriction that is extrathoracic with low ERV which could be related to habitus as well as possible diaphragmatic injury/paralysis.  Trial of Breztri, today inhaler has not been helpful.  By enlarge bulk of symptoms felt not to be related to pulmonary issues but other causes such as CAD, he has OSA, likely OHS given chronically elevated bicarbonate.  Repeat PFTs 02/2023 consistent with x-ray, because of restriction, habitus and paralyzed diaphragm most likely culprit.  No improvement with Breztri.  Trial of Stiolto without improvement.  Inhalers unlikely to provide significant improvement, no need to try again the future unless significant change in symptoms.  Subacute cough:  Associated mild wheeze on exam.  Regular sputum.  3 to 4 weeks.  Likely level of bronchitis.  Infectious versus inflammatory, allergic trigger.  Unable to really identify.  No improvement with doxycycline for foot wound.  Prednisone taper today.  Consider course of clindamycin in the future if not improving.  Elevated left hemidiaphragm: Presently since 2018.  Suspect related to cervical spine arthritis and subsequent nerve damage.  No further workup or evaluation recommended at this time.   Return if symptoms worsen or fail to improve, for f/u Dr. Judeth Horn.   Karren Burly, MD 07/10/2023

## 2023-07-10 NOTE — Progress Notes (Deleted)
@Patient  ID: [name], male    DOB: [birth year], 68 y.o.   MRN: [id number]  Chief Complaint  Patient presents with   Follow-up    DOE (dyspnea on exertion) F/U visit. Pt c/o Cough with green sputum for 3 weeks.     Referring provider: Ardith Dark, MD  HPI:   68 y.o. man with history of CAD, history of COVID infection in the past, presents for evaluation of dyspnea on exertion.    Returns for evaluation of dyspnea.  Historically not improved with Breztri.  Repeat PFTs today.  In the past suggestive of extrathoracic restriction likely obesity and left hemidiaphragm paralysis.  PFTs today borderline but no significant bronchodilator response.  Severely reduced TLC with severe reduced ERV, normal DLCO.  Again suggestive of extraparenchymal restriction.  Reviewed prior cross-sectional imaging 01/2022 in detail, no sign of ILD, does show atelectasis as expected given description above.  HPI at initial visit: Patient with long history of dyspnea on exertion.  Worse in the couple years ago.  Eventually was found to have coronary disease.  Had stent placed.  This improved for a bit.  But still with residual shortness of breath over the last year to 15 months.  Sometimes get sweaty, breaks out in sweat, she is very short of breath.  Walking long distances.  Or any level of exertion up stairs or carrying things.  Admittedly he is less active over time.  Does have some musculoskeletal issues, arthritis etc. that limits his activity due to pain etc.  Reviewed serial chest images, most recently CT scan 01/2022 are clear without any signs of ILD or otherwise.  He had a PE in the past but completed Xarelto and had subsequent VQ scan that was negative.  Reviewed serial chest x-rays most recently 2023 but dating back to at least 2018 and on my review interpretation demonstrate clear lungs with elevated left hemidiaphragm suspicious for diaphragmatic dysfunction/paralyzation.  He has not improved with  inhalers in the past.  Tried Advair HFA and Diskus, Trelegy.  Discussed trial of Breztri to see if HFA and triple inhaled therapy would be more effective.  Discussed that to date we have no evidence that lungs are contributing to her shortness of breath.  Reviewed PFTs 2022 that are all normal, did show severely reduced ERV and borderline TLC suspicious for contribution of borderline extrathoracic restriction related to habitus versus diaphragmatic paralyzation as discussed above.  Questionaires / Pulmonary Flowsheets:   ACT:      No data to display          MMRC: mMRC Dyspnea Scale mMRC Score  07/27/2021  2:43 PM 3    Epworth:     01/13/2014    9:54 AM  Results of the Epworth flowsheet  Sitting and reading 2  Watching TV 3  Sitting, inactive in a public place (e.g. a theatre or a meeting) 1  As a passenger in a car for an hour without a break 2  Lying down to rest in the afternoon when circumstances permit 2  Sitting and talking to someone 1  Sitting quietly after a lunch without alcohol 0  In a car, while stopped for a few minutes in traffic 0  Total score 11    Tests:   FENO:  No results found for: "NITRICOXIDE"  PFT:    Latest Ref Rng & Units 02/25/2023    8:52 AM 02/15/2021   11:37 AM  PFT Results  FVC-Pre L 2.66  2.98   FVC-Predicted Pre % 54  60   FVC-Post L 2.92  3.33   FVC-Predicted Post % 59  67   Pre FEV1/FVC % % 79  84   Post FEV1/FCV % % 81  84   FEV1-Pre L 2.12  2.49   FEV1-Predicted Pre % 58  67   FEV1-Post L 2.36  2.80   DLCO uncorrected ml/min/mmHg 22.50  30.12   DLCO UNC% % 80  105   DLCO corrected ml/min/mmHg 22.50    DLCO COR %Predicted % 80    DLVA Predicted % 111  143   TLC L 3.67  5.84   TLC % Predicted % 49  78   RV % Predicted % -2  109   Personally reviewed and interpreted as spirometry suggestive of moderate restriction versus air trapping, no bronchodilator response, borderline but acceptable total lung capacity, severely reduced  ERV, severely reduced TLC, normal DLCO  WALK:     02/27/2023    2:52 PM 02/02/2021    3:11 PM  SIX MIN WALK  Supplimental Oxygen during Test? (L/min)  No  2 Minute Oxygen Saturation % 95 %   2 Minute HR 91   4 Minute Oxygen Saturation % 94 %   4 Minute HR 101   6 Minute Oxygen Saturation % 94 %   6 Minute HR 100   Tech Comments:  pt walked at an average pace completing all required laps having complaints of mild SOB.    Imaging: DG Foot Complete Right Result Date: 06/25/2023 Please see detailed radiograph report in office note.  Personally reviewed and as per EMR Lab Results: Personally reviewed CBC    Component Value Date/Time   WBC 5.8 05/13/2023 1043   RBC 4.50 05/13/2023 1043   HGB 14.6 05/13/2023 1043   HGB 15.1 04/25/2022 1405   HGB 14.2 07/14/2021 1543   HCT 42.2 05/13/2023 1043   HCT 42.6 07/14/2021 1543   PLT 186 05/13/2023 1043   PLT 169 04/25/2022 1405   PLT 201 07/14/2021 1543   MCV 93.8 05/13/2023 1043   MCV 95 07/14/2021 1543   MCH 32.4 05/13/2023 1043   MCHC 34.6 05/13/2023 1043   RDW 12.8 05/13/2023 1043   RDW 12.4 07/14/2021 1543   LYMPHSABS 1.6 04/25/2022 1405   LYMPHSABS 1.8 08/15/2018 1001   MONOABS 0.4 04/25/2022 1405   EOSABS 0.1 04/25/2022 1405   EOSABS 0.1 08/15/2018 1001   BASOSABS 0.0 04/25/2022 1405   BASOSABS 0.0 08/15/2018 1001    BMET    Component Value Date/Time   NA 134 (L) 05/13/2023 1043   NA 139 03/26/2023 1132   K 3.7 05/13/2023 1043   CL 97 (L) 05/13/2023 1043   CO2 28 05/13/2023 1043   GLUCOSE 193 (H) 05/13/2023 1043   BUN 18 05/13/2023 1043   BUN 17 03/26/2023 1132   CREATININE 1.44 (H) 05/13/2023 1043   CREATININE 1.47 (H) 04/25/2022 1405   CREATININE 1.33 (H) 01/03/2016 1505   CALCIUM 8.6 (L) 05/13/2023 1043   GFRNONAA 53 (L) 05/13/2023 1043   GFRNONAA 52 (L) 04/25/2022 1405   GFRNONAA 60 10/21/2013 1546   GFRAA 75 08/15/2018 1001   GFRAA 70 10/21/2013 1546    BNP    Component Value Date/Time   BNP 3.6  06/30/2016 1039    ProBNP    Component Value Date/Time   PROBNP 15.0 01/24/2022 1620    Specialty Problems       Pulmonary Problems   OSA (  obstructive sleep apnea)   COVID-19 long hauler manifesting chronic dyspnea   Paraseptal emphysema (HCC)   Allergic rhinitis    Allergies  Allergen Reactions   Jardiance [Empagliflozin]     dizziness   Penicillins Nausea Only    Upset stomach    Immunization History  Administered Date(s) Administered   Influenza, High Dose Seasonal PF 04/30/2018   Influenza,inj,Quad PF,6+ Mos 06/30/2016, 03/14/2017   Moderna Sars-Covid-2 Vaccination 10/14/2020   PFIZER(Purple Top)SARS-COV-2 Vaccination 09/10/2019, 10/05/2019   Pneumococcal Polysaccharide-23 10/09/2012   Tdap 02/09/2015    Past Medical History:  Diagnosis Date   Abdominal aortic aneurysm (AAA), 30-34 mm diameter (HCC) 06/01/2017   Nml w/ greatest dm 3 cm US Aorta 06/2016, Abd [place] 05/01/2017 showed infrarenal dilatation at 3.4 cm - rec repeat US in 3 yrs.   Allergy    "pollen"   Arthritis    Cataract    removed,bilateral   Cervical disc disease 12/26/2011   Coronary artery disease involving native coronary artery of native heart without angina pectoris 12/01/2016   He had coronary angiography in 2011. There was irregularity within the LAD with up to 30% narrowing. The myocardial perfusion imaging done 11/07/2015 did not demonstrate any evidence of ischemia with a low risk nuclear stress test other than EF estimated at 47%. Chest CT 01/25/2016 showed diffuse coronary artery calcifications with heavy calcifications in the LAD. Pt seen by cardiology Dr. Mendel Ryder    Diabetes mellitus    Difficult intubation 04/05/2014   Dyslipidemia    Family history of anesthesia complication    pt states took days for his father to awaken after mask was used    Fatty liver 06/01/2017   [place] 05/01/2017   GERD (gastroesophageal reflux disease)    has been having acid reflux since recent endoscopy     H/O hiatal hernia    repair with lap band, now has ventral hernia midline abd   Hearing loss-aides 03/26/2012   Hyperlipidemia    Hypertension    Lapband APS + hiatal hernia repair Oct 2015 04/05/2014   Morbid obesity (HCC)    OSA (obstructive sleep apnea)    Rotator cuff tear    Seasonal allergies    Shortness of breath    pt states related to high BP meds with extended walking or climbling stairs   Sleep apnea     Tobacco History: Social History   Tobacco Use  Smoking Status Former   Current packs/day: 0.00   Average packs/day: 4.0 packs/day for 20.0 years (80.0 ttl pk-yrs)   Types: Cigarettes   Start date: 07/19/1973   Quit date: 12/13/1991   Years since quitting: 31.5   Passive exposure: Never  Smokeless Tobacco Never   Counseling given: Not Answered   Continue to not smoke  Outpatient Encounter Medications as of 07/10/2023  Medication Sig   acetaminophen (TYLENOL) 500 MG tablet Take 500 mg by mouth every 6 (six) hours as needed for moderate pain (pain score 4-6).   aspirin EC 81 MG tablet Take 81 mg by mouth daily. Swallow whole.   Cholecalciferol (VITAMIN D) 50 MCG (2000 UT) tablet Take 2,000 Units by mouth daily.   diltiazem (CARDIZEM CD) 180 MG 24 hr capsule Take 1 capsule (180 mg total) by mouth daily.   diphenhydrAMINE (BENADRYL) 25 MG tablet Take 25 mg by mouth every 6 (six) hours as needed for itching or allergies.   doxycycline (VIBRA-TABS) 100 MG tablet Take 1 tablet (100 mg total) by mouth 2 (two)  times daily.   ezetimibe (ZETIA) 10 MG tablet Take 10 mg by mouth daily.   fluticasone (FLONASE) 50 MCG/ACT nasal spray Place 1 spray into both nostrils daily as needed for allergies.   glucose blood (TRUETEST TEST) test strip Use as instructed   Homeopathic Products (ZICAM COLD REMEDY PO) Take 1 tablet by mouth as needed.   ketoconazole (NIZORAL) 2 % cream Apply 1 Application topically daily as needed for irritation.   loratadine (CLARITIN) 10 MG tablet Take 10 mg  by mouth daily.   NON FORMULARY Pt use a c-pap night   Polyethyl Glycol-Propyl Glycol (SYSTANE OP) Place 1 drop into both eyes as needed (dry eyes).   potassium chloride SA (KLOR-CON M) 20 MEQ tablet Take 3 tablets (60 mEq total) by mouth daily.   rosuvastatin (CRESTOR) 40 MG tablet TAKE ONE TABLET BY MOUTH ONE TIME DAILY   silver sulfADIAZINE (SSD) 1 % cream Apply 1 Application topically daily.   tamsulosin (FLOMAX) 0.4 MG CAPS capsule TAKE ONE CAPSULE BY MOUTH ONCE A DAY 30 MINUTES AFTER THE SAME MEAL EACH DAY   torsemide (DEMADEX) 20 MG tablet Take 3 tablets (60 mg total) by mouth daily.   TRESIBA FLEXTOUCH 200 UNIT/ML FlexTouch Pen INJECT 65 UNITS UNDER THE SKIN TWICE DAILY   lisinopril (ZESTRIL) 10 MG tablet Take 1 tablet (10 mg total) by mouth daily.   [DISCONTINUED] arformoterol (BROVANA) 15 MCG/2ML NEBU Take 2 mLs (15 mcg total) by nebulization 2 (two) times daily. (Patient not taking: Reported on 05/09/2023)   [DISCONTINUED] mupirocin ointment (BACTROBAN) 2 % Apply 1 Application topically daily. Apply 0.5 gm daily to foot ulcer   [DISCONTINUED] revefenacin (YUPELRI) 175 MCG/3ML nebulizer solution Take 3 mLs (175 mcg total) by nebulization daily. (Patient not taking: Reported on 05/09/2023)   [DISCONTINUED] silver sulfADIAZINE (SILVADENE) 1 % cream Apply 1 Application topically daily.   [DISCONTINUED] tirzepatide [facility]) 2.5 MG/0.5ML Pen Inject 2.5 mg into the skin once a week. (Patient not taking: Reported on 05/09/2023)   No facility-administered encounter medications on file as of 07/10/2023.     Review of Systems  Review of Systems  N/a Physical Exam  BP 122/78 (BP Location: Left Arm, Patient Position: Sitting, Cuff Size: Large)   Pulse 77   Ht 6' (1.829 m)   Wt (!) 301 lb 9.6 oz (136.8 kg)   SpO2 93%   BMI 40.90 kg/m   Wt Readings from Last 5 Encounters:  07/10/23 (!) 301 lb 9.6 oz (136.8 kg)  05/14/23 (!) 304 lb (137.9 kg)  05/13/23 (!) 304 lb (137.9 kg)   04/02/23 (!) 306 lb (138.8 kg)  03/06/23 (!) 306 lb (138.8 kg)    BMI Readings from Last 5 Encounters:  07/10/23 40.90 kg/m  05/14/23 41.23 kg/m  05/13/23 41.23 kg/m  04/02/23 41.50 kg/m  03/06/23 41.50 kg/m     Physical Exam Sitting in chair, no acute distress Eyes: EOMI, no icterus Neck: Supple, no JVP Pulmonary: Distant, clear Cardiovascular: Warm, trace edema bilaterally Abdomen: Distended, soft, bowel sounds present MSK: No synovitis, no joint effusion Neuro: Normal gait, no weakness Psych: Normal mood, full affect   Assessment & Plan:   Dyspnea on exertion:   Serial chest images clear including chest x-ray and CT scan most recently 01/2022.  Pulmonary function test 2022 within normal limits.  High suspicion for deconditioning and habitus contributing to his symptoms.  He does have elevation of elevated hemidiaphragm and borderline but acceptable total lung capacity, near mild restriction that is extrathoracic  with low ERV which could be related to habitus as well as possible diaphragmatic injury/paralysis.  Trial of Breztri, today inhaler has not been helpful.  By enlarge bulk of symptoms felt not to be related to pulmonary issues but other causes such as CAD, he has OSA, likely OHS given chronically elevated bicarbonate.  Repeat PFTs 02/2023 consistent with extraparenchymal cause of restriction, habitus and paralyzed diaphragm most likely culprit.  No improvement with Breztri.  Trial of Stiolto.  Referral to pulmonary rehab.  Elevated left hemidiaphragm: Presently since 2018.  Suspect related to cervical spine arthritis and subsequent nerve damage.  No further workup or evaluation recommended at this time.   No follow-ups on file.   Karren Burly, MD 07/10/2023   This appointment required 41 minutes of patient care (this includes precharting, chart review, review of results, face-to-face care, etc.).

## 2023-07-11 ENCOUNTER — Encounter: Payer: Self-pay | Admitting: Family Medicine

## 2023-07-11 ENCOUNTER — Ambulatory Visit (INDEPENDENT_AMBULATORY_CARE_PROVIDER_SITE_OTHER): Payer: Medicare Other | Admitting: Family Medicine

## 2023-07-11 ENCOUNTER — Telehealth: Payer: Self-pay

## 2023-07-11 ENCOUNTER — Ambulatory Visit: Payer: Medicare Other

## 2023-07-11 VITALS — BP 130/80 | HR 74 | Temp 97.7°F | Ht 72.0 in | Wt 301.4 lb

## 2023-07-11 DIAGNOSIS — I152 Hypertension secondary to endocrine disorders: Secondary | ICD-10-CM

## 2023-07-11 DIAGNOSIS — N400 Enlarged prostate without lower urinary tract symptoms: Secondary | ICD-10-CM | POA: Diagnosis not present

## 2023-07-11 DIAGNOSIS — E1169 Type 2 diabetes mellitus with other specified complication: Secondary | ICD-10-CM

## 2023-07-11 DIAGNOSIS — E1165 Type 2 diabetes mellitus with hyperglycemia: Secondary | ICD-10-CM | POA: Diagnosis not present

## 2023-07-11 DIAGNOSIS — Z7985 Long-term (current) use of injectable non-insulin antidiabetic drugs: Secondary | ICD-10-CM

## 2023-07-11 DIAGNOSIS — E785 Hyperlipidemia, unspecified: Secondary | ICD-10-CM | POA: Diagnosis not present

## 2023-07-11 DIAGNOSIS — E1159 Type 2 diabetes mellitus with other circulatory complications: Secondary | ICD-10-CM

## 2023-07-11 LAB — MICROALBUMIN / CREATININE URINE RATIO
Creatinine,U: 71.1 mg/dL
Microalb Creat Ratio: 2.1 mg/g (ref 0.0–30.0)
Microalb, Ur: 1.5 mg/dL (ref 0.0–1.9)

## 2023-07-11 LAB — LIPID PANEL
Cholesterol: 163 mg/dL (ref 0–200)
HDL: 41.5 mg/dL (ref >39.00)
LDL Cholesterol: 57 mg/dL (ref 0–99)
NonHDL: 121.82
Total CHOL/HDL Ratio: 4
Triglycerides: 322 mg/dL — ABNORMAL HIGH (ref 0.0–149.0)
VLDL: 64.4 mg/dL — ABNORMAL HIGH (ref 0.0–40.0)

## 2023-07-11 LAB — CBC
HCT: 46.3 % (ref 39.0–52.0)
Hemoglobin: 15.5 g/dL (ref 13.0–17.0)
MCHC: 33.4 g/dL (ref 30.0–36.0)
MCV: 98.4 fL (ref 78.0–100.0)
Platelets: 191 10*3/uL (ref 150.0–400.0)
RBC: 4.7 Mil/uL (ref 4.22–5.81)
RDW: 14.2 % (ref 11.5–15.5)
WBC: 4.1 10*3/uL (ref 4.0–10.5)

## 2023-07-11 LAB — COMPREHENSIVE METABOLIC PANEL
ALT: 31 U/L (ref 0–53)
AST: 38 U/L — ABNORMAL HIGH (ref 0–37)
Albumin: 4.3 g/dL (ref 3.5–5.2)
Alkaline Phosphatase: 62 U/L (ref 39–117)
BUN: 16 mg/dL (ref 6–23)
CO2: 34 meq/L — ABNORMAL HIGH (ref 19–32)
Calcium: 9.1 mg/dL (ref 8.4–10.5)
Chloride: 97 meq/L (ref 96–112)
Creatinine, Ser: 1.27 mg/dL (ref 0.40–1.50)
GFR: 58.12 mL/min — ABNORMAL LOW (ref >60.00)
Glucose, Bld: 183 mg/dL — ABNORMAL HIGH (ref 70–99)
Potassium: 3.7 meq/L (ref 3.5–5.1)
Sodium: 140 meq/L (ref 135–145)
Total Bilirubin: 0.4 mg/dL (ref 0.2–1.2)
Total Protein: 6.6 g/dL (ref 6.0–8.3)

## 2023-07-11 LAB — PSA: PSA: 1.38 ng/mL (ref 0.10–4.00)

## 2023-07-11 LAB — HEMOGLOBIN A1C: Hgb A1c MFr Bld: 8.1 % — ABNORMAL HIGH (ref 4.6–6.5)

## 2023-07-11 LAB — TSH: TSH: 1.53 u[IU]/mL (ref 0.35–5.50)

## 2023-07-11 MED ORDER — TRESIBA FLEXTOUCH 200 UNIT/ML ~~LOC~~ SOPN: 65.0000 [IU] | PEN_INJECTOR | Freq: Two times a day (BID) | SUBCUTANEOUS | 0 refills | Status: DC

## 2023-07-11 MED ORDER — TIRZEPATIDE 2.5 MG/0.5ML ~~LOC~~ SOAJ: 2.5000 mg | SUBCUTANEOUS | 0 refills | Status: DC

## 2023-07-11 NOTE — Progress Notes (Signed)
Patient presents today to pick up custom molded foot orthotics, diagnosed with Pes planus and toe ulceration by Dr. Ardelle Anton.   Orthotics were dispensed and fit was satisfactory. Reviewed instructions for break-in and wear. Written instructions given to patient.  Patient will follow up as needed.  Addison Bailey CPed, CFo, CFm

## 2023-07-11 NOTE — Telephone Encounter (Signed)
Lm for patient to please CB so we can get him in for orthotic fitting

## 2023-07-11 NOTE — Assessment & Plan Note (Addendum)
Last A1c not controlled at 9.0.  He did try the [facility] but discontinued for unclear reasons.  We did discuss importance of good A1c control and importance of getting his overload.  Is agreeable to this plan.  Will restart Mounjaro 2.5 mg daily.  We did discuss potential side effects.  He will follow-up with [place] in a few weeks via MyChart we can adjust the dose as tolerated.  He will continue his current dose of Tresiba 140 units daily as well as Jardiance 12.5 mg daily.  Discussed with patient ideally we can start weaning down on tresiba once we get his A1c under controlled with escalating doses of Mounjaro.  He will follow-up with me in 3 months to recheck A1c though will follow-up with [place] soon via MyChart as above.

## 2023-07-11 NOTE — Progress Notes (Signed)
   [name] is a 68 y.o. male who presents today for an office visit.  Assessment/Plan:  Chronic Problems Addressed Today: T2DM (type 2 diabetes mellitus) (HCC) Last A1c not controlled at 9.0.  He did try the [facility] but discontinued for unclear reasons.  We did discuss importance of good A1c control and importance of getting his overload.  Is agreeable to this plan.  Will restart Mounjaro 2.5 mg daily.  We did discuss potential side effects.  He will follow-up with [place] in a few weeks via MyChart we can adjust the dose as tolerated.  He will continue his current dose of Tresiba 140 units daily as well as Jardiance 12.5 mg daily.  Discussed with patient ideally we can start weaning down on tresiba once we get his A1c under controlled with escalating doses of Mounjaro.  He will follow-up with me in 3 months to recheck A1c though will follow-up with [place] soon via MyChart as above.  BPH (benign prostatic hyperplasia) Following with urology will check PSA.  He is on Flomax 0.4 mg daily.  Hypertension associated with diabetes (HCC) Blood pressure at goal on diltiazem 180 mg daily and lisinopril 10 mg daily.  Dyslipidemia associated with type 2 diabetes mellitus [facility]) Following with cardiology.  Check labs.  He is on Crestor 40 mg daily.     Subjective:  HPI:  See Assessment / plan for status of chronic conditions.  Patient is here today for follow-up.  I saw him last about 4 months ago.  At that time A1c was not controlled at 9.0.  We started him on Mounjaro.  He also continued his insulin at that time.  He started taking half a dose of his jardiance. He has done well with this. He did take a few dose of Mounjaro but he does not remember why he stopped taking this. He does that think that he had any bad reactions. He did recently see his urologist and he was told it is not very important for him to get his sugar down to help with his bladder issues.  They are concerned about potential  neurogenic bladder due to high levels of his sugar.  He did recently see pulmonology as well.  Been dealing with a bronchitis.  Recently started on prednisone.  Symptoms do seem to be improving.       Objective:  Physical Exam: BP 130/80   Pulse 74   Temp 97.7 F (36.5 C) (Temporal)   Ht 6' (1.829 m)   Wt (!) 301 lb 6.4 oz (136.7 kg)   SpO2 97%   BMI 40.88 kg/m   Gen: No acute distress, resting comfortably CV: Regular rate and rhythm with no murmurs appreciated Pulm: Normal work of breathing, clear to auscultation bilaterally with no crackles, wheezes, or rhonchi Neuro: Grossly normal, moves all extremities Psych: Normal affect and thought content      Hetal Proano M. [name], MD 07/11/2023 11:59 AM

## 2023-07-11 NOTE — Assessment & Plan Note (Signed)
Following with urology will check PSA.  He is on Flomax 0.4 mg daily.

## 2023-07-11 NOTE — Patient Instructions (Addendum)
It was very nice to see you today!  Please start the [facility]. We are starting a low dose.  You may have some nausea though this should improve.  Let me know how you doing in a few weeks and we can increase the dose.  Please let me know if your side effects are so bad that you need to discontinue.  We will check labs today including your PSA.  Return in about 3 months (around 10/09/2023) for Follow Up.   Take care, Dr [name]  PLEASE NOTE:  If you had any lab tests, please let us know if you have not heard back within a few days. You may see your results on mychart before we have a chance to review them but we will give you a call once they are reviewed by [place].   If we ordered any referrals today, please let us know if you have not heard from their office within the next week.   If you had any urgent prescriptions sent in today, please check with the pharmacy within an hour of our visit to make sure the prescription was transmitted appropriately.   Please try these tips to maintain a healthy lifestyle:  Eat at least 3 REAL meals and 1-2 snacks per day.  Aim for no more than 5 hours between eating.  If you eat breakfast, please do so within one hour of getting up.   Each meal should contain half fruits/vegetables, one quarter protein, and one quarter carbs (no bigger than a computer mouse)  Cut down on sweet beverages. This includes juice, soda, and sweet tea.   Drink at least 1 glass of water with each meal and aim for at least 8 glasses per day  Exercise at least 150 minutes every week.

## 2023-07-11 NOTE — Assessment & Plan Note (Signed)
Blood pressure at goal on diltiazem 180 mg daily and lisinopril 10 mg daily.

## 2023-07-11 NOTE — Assessment & Plan Note (Signed)
Following with cardiology.  Check labs.  He is on Crestor 40 mg daily.

## 2023-07-12 ENCOUNTER — Encounter: Payer: Self-pay | Admitting: Family Medicine

## 2023-07-12 NOTE — Progress Notes (Signed)
Is A1c is 8.1.  As we discussed at his office visit we need to get this to 7 or lower.  He should let us know how he is doing with the [facility].  The rest of his labs are all at goal.  Do not need to make any other changes to his treatment plan at this time.

## 2023-07-14 ENCOUNTER — Encounter: Payer: Self-pay | Admitting: Family Medicine

## 2023-07-15 NOTE — Telephone Encounter (Signed)
Please see pt msg and advise on any alternative recommended. We do have a sample in the office for patient current dose.

## 2023-07-16 NOTE — Telephone Encounter (Signed)
Please advise

## 2023-07-16 NOTE — Progress Notes (Signed)
HE can check with insurance to see if there any alternatives. Please also check to see if we have any samples we can give here.

## 2023-07-16 NOTE — Telephone Encounter (Signed)
This is not something I prescribe. Recommend he see ENT.  [name]. [name], MD 07/16/2023 3:21 PM

## 2023-07-16 NOTE — Telephone Encounter (Signed)
See results note.

## 2023-07-18 ENCOUNTER — Encounter: Payer: Self-pay | Admitting: Pulmonary Disease

## 2023-07-21 ENCOUNTER — Other Ambulatory Visit: Payer: Self-pay | Admitting: Family Medicine

## 2023-07-27 ENCOUNTER — Encounter: Payer: Self-pay | Admitting: Family Medicine

## 2023-07-29 NOTE — Telephone Encounter (Signed)
 See note

## 2023-07-29 NOTE — Telephone Encounter (Signed)
 We typically would only use for a week or two at a time due to concern for side effects including weight gain, high sugar, osteoporosis, stomach ulcer, etc.  Jinny Mounts. Daneil Dunker, MD 07/29/2023 9:40 AM

## 2023-07-30 NOTE — Telephone Encounter (Signed)
See note

## 2023-07-30 NOTE — Telephone Encounter (Signed)
I cannot prescribe chronic prednisone however it may be reasonable for him to see a rheumatologist if he had significant improvement with this with the prednisone as he may have an underlying autoimmune issue.  Recommend referral to rheumatology.

## 2023-07-31 ENCOUNTER — Other Ambulatory Visit: Payer: Self-pay | Admitting: *Deleted

## 2023-07-31 DIAGNOSIS — M549 Dorsalgia, unspecified: Secondary | ICD-10-CM

## 2023-07-31 NOTE — Telephone Encounter (Signed)
Referral placed.

## 2023-08-13 ENCOUNTER — Encounter: Payer: Self-pay | Admitting: Podiatry

## 2023-08-15 ENCOUNTER — Other Ambulatory Visit: Payer: Self-pay

## 2023-08-15 ENCOUNTER — Other Ambulatory Visit: Payer: Self-pay | Admitting: Podiatry

## 2023-08-15 DIAGNOSIS — M549 Dorsalgia, unspecified: Secondary | ICD-10-CM

## 2023-08-15 MED ORDER — DOXYCYCLINE HYCLATE 100 MG PO TABS: 100.0000 mg | ORAL_TABLET | Freq: Two times a day (BID) | ORAL | 0 refills | Status: DC

## 2023-08-16 ENCOUNTER — Encounter: Payer: Self-pay | Admitting: Family Medicine

## 2023-08-19 ENCOUNTER — Other Ambulatory Visit: Payer: Self-pay | Admitting: Podiatry

## 2023-08-19 ENCOUNTER — Other Ambulatory Visit: Payer: Self-pay | Admitting: *Deleted

## 2023-08-19 ENCOUNTER — Telehealth: Payer: Self-pay | Admitting: *Deleted

## 2023-08-19 MED ORDER — TRESIBA FLEXTOUCH 200 UNIT/ML ~~LOC~~ SOPN: 65.0000 [IU] | PEN_INJECTOR | Freq: Two times a day (BID) | SUBCUTANEOUS | 0 refills | Status: DC

## 2023-08-19 MED ORDER — CIPROFLOXACIN HCL 500 MG PO TABS: 500.0000 mg | ORAL_TABLET | Freq: Two times a day (BID) | ORAL | 0 refills | Status: DC

## 2023-08-19 NOTE — Telephone Encounter (Signed)
 Rx send in to [facility] pharmacy

## 2023-08-19 NOTE — Telephone Encounter (Signed)
 Copied from CRM [id number]. Topic: General - Other >> Aug 19, 2023 10:15 AM Aletta Edouard wrote: Reason for CRM: patient is calling in regarding his insulin he is in [place] and needing his insulin asap he would like for nurse Kareem Aul to give him  call back   Rx send to [facility]

## 2023-08-20 ENCOUNTER — Other Ambulatory Visit: Payer: Self-pay | Admitting: Family Medicine

## 2023-08-24 ENCOUNTER — Encounter: Payer: Self-pay | Admitting: Family Medicine

## 2023-08-24 ENCOUNTER — Encounter: Payer: Self-pay | Admitting: Internal Medicine

## 2023-08-25 ENCOUNTER — Other Ambulatory Visit: Payer: Self-pay | Admitting: Internal Medicine

## 2023-08-26 ENCOUNTER — Other Ambulatory Visit: Payer: Self-pay

## 2023-08-26 DIAGNOSIS — E1165 Type 2 diabetes mellitus with hyperglycemia: Secondary | ICD-10-CM

## 2023-08-26 MED ORDER — TIRZEPATIDE 5 MG/0.5ML ~~LOC~~ SOAJ: 5.0000 mg | SUBCUTANEOUS | 0 refills | Status: DC

## 2023-08-26 NOTE — Telephone Encounter (Signed)
 Sent in Tirzepatide 5mg  once a week to pt's pharmacy

## 2023-08-26 NOTE — Telephone Encounter (Signed)
 Noted.  Recommend we increase to 5 mg weekly.  Please send this in if he is agreeable.  We should recheck his A1c here in the office soon.  Please make sure that he has a future visit scheduled.

## 2023-08-27 ENCOUNTER — Ambulatory Visit (INDEPENDENT_AMBULATORY_CARE_PROVIDER_SITE_OTHER)

## 2023-08-27 ENCOUNTER — Ambulatory Visit (INDEPENDENT_AMBULATORY_CARE_PROVIDER_SITE_OTHER): Admitting: Podiatry

## 2023-08-27 ENCOUNTER — Encounter: Payer: Self-pay | Admitting: Family Medicine

## 2023-08-27 ENCOUNTER — Other Ambulatory Visit: Payer: Self-pay | Admitting: Internal Medicine

## 2023-08-27 ENCOUNTER — Other Ambulatory Visit (HOSPITAL_COMMUNITY)
Admission: RE | Admit: 2023-08-27 | Discharge: 2023-08-27 | Disposition: A | Discharge status: Home / Self Care | Payer: Self-pay | Source: Ambulatory Visit | Attending: Oncology | Admitting: Oncology

## 2023-08-27 ENCOUNTER — Encounter: Payer: Self-pay | Admitting: Podiatry

## 2023-08-27 DIAGNOSIS — M79671 Pain in right foot: Secondary | ICD-10-CM

## 2023-08-27 DIAGNOSIS — Z006 Encounter for examination for normal comparison and control in clinical research program: Secondary | ICD-10-CM | POA: Insufficient documentation

## 2023-08-27 DIAGNOSIS — L03031 Cellulitis of right toe: Secondary | ICD-10-CM

## 2023-08-27 DIAGNOSIS — L97512 Non-pressure chronic ulcer of other part of right foot with fat layer exposed: Secondary | ICD-10-CM | POA: Diagnosis not present

## 2023-08-27 NOTE — Telephone Encounter (Signed)
 noted

## 2023-08-28 ENCOUNTER — Encounter: Payer: Self-pay | Admitting: Family Medicine

## 2023-08-28 NOTE — Progress Notes (Signed)
 Subjective: Chief Complaint  Patient presents with   Diabetic Ulcer    RM#14 Right big toe ulcer patient presents with signs of infection swollen and red currently on antibiotics.   68 year old male presents today for concerns for an acute appointment.  He states that he is doing the doxycycline but the Cipro and feels he is doing better today.  Still having redness.  He had some bleeding but no pus.  No red streaks.  Does not Dors any fevers or chills.  No other concerns today.  Objective: AAO x3, NAD DP/PT pulses palpable bilaterally, CRT less than 3 seconds There is localized edema and erythema present to right hallux.  Hyperkeratotic tissue with dried blood, old blister noted to the plantar aspect the toe.  Has not able to measure the wound prior to debridement as it was covered with callus, old blister but upon debridement there is a granular wound measuring 0.5 x 0.4 x 0.1 cm without any probing, undermining or tunneling.  There is no fluctuation, crepitation, malodor. No pain with calf compression, swelling, warmth, erythema  Assessment: Ulceration right hallux, cellulitis  Plan: X-rays were obtained and reviewed.  3 views of the foot were obtained.  No definitive cortical changes history of sesamoids compared to prior x-rays.  There is no soft tissue emphysema present.  Right hallux ulceration -Medically necessary wound debridement was performed.  Sharply debrided the wound utilizing a 312 blade scalpel to remove nonviable, devitalized tissue in order for wound healing.  Minimal blood loss.  Hemostasis achieved with manual compression.  Pre and post with measurements are noted above.  Cleaned area with saline.  Silvadene applied followed by dressing.  Continue daily dressing changes, offloading.  Right hallux cellulitis -Finish course of antibiotics.  If erythema remains we will continue doxycycline once he finishes this.  Vivi Barrack DPM

## 2023-08-29 NOTE — Telephone Encounter (Signed)
 Insulin glargine is going to be the closest to what he is already taking. Please send in new prescription.  [name]. [name], MD 08/29/2023 10:35 AM

## 2023-08-30 ENCOUNTER — Other Ambulatory Visit: Payer: Self-pay | Admitting: *Deleted

## 2023-08-30 MED ORDER — INSULIN GLARGINE 100 UNIT/ML SOLOSTAR PEN: 66.0000 [IU] | PEN_INJECTOR | Freq: Two times a day (BID) | SUBCUTANEOUS | 11 refills | Status: DC

## 2023-08-30 NOTE — Telephone Encounter (Signed)
 Rx send in to [facility]

## 2023-09-06 LAB — GENECONNECT MOLECULAR SCREEN: Genetic Analysis Overall Interpretation: NEGATIVE

## 2023-09-07 ENCOUNTER — Encounter: Payer: Self-pay | Admitting: Podiatry

## 2023-09-10 LAB — OPHTHALMOLOGY REPORT-SCANNED

## 2023-09-11 ENCOUNTER — Ambulatory Visit: Payer: Medicare Other | Attending: Internal Medicine | Admitting: Internal Medicine

## 2023-09-11 ENCOUNTER — Other Ambulatory Visit (HOSPITAL_COMMUNITY)

## 2023-09-11 ENCOUNTER — Encounter: Payer: Self-pay | Admitting: Internal Medicine

## 2023-09-11 VITALS — BP 140/60 | HR 78 | Ht 72.0 in | Wt 294.6 lb

## 2023-09-11 DIAGNOSIS — I152 Hypertension secondary to endocrine disorders: Secondary | ICD-10-CM | POA: Diagnosis present

## 2023-09-11 DIAGNOSIS — I714 Abdominal aortic aneurysm, without rupture, unspecified: Secondary | ICD-10-CM | POA: Diagnosis present

## 2023-09-11 DIAGNOSIS — I5032 Chronic diastolic (congestive) heart failure: Secondary | ICD-10-CM | POA: Diagnosis not present

## 2023-09-11 DIAGNOSIS — I700 Atherosclerosis of aorta: Secondary | ICD-10-CM | POA: Insufficient documentation

## 2023-09-11 DIAGNOSIS — E1159 Type 2 diabetes mellitus with other circulatory complications: Secondary | ICD-10-CM | POA: Insufficient documentation

## 2023-09-11 DIAGNOSIS — I2510 Atherosclerotic heart disease of native coronary artery without angina pectoris: Secondary | ICD-10-CM | POA: Diagnosis present

## 2023-09-11 NOTE — Patient Instructions (Signed)
 Medication Instructions:  Your physician recommends that you continue on your current medications as directed. Please refer to the Current Medication list given to you today.  *If you need a refill on your cardiac medications before your next appointment, please call your pharmacy*   Lab Work: FLP, BMP  If you have labs (blood work) drawn today and your tests are completely normal, you will receive your results only by: MyChart Message (if you have MyChart) OR A paper copy in the mail If you have any lab test that is abnormal or we need to change your treatment, we will call you to review the results.   Testing/Procedures: Your physician has requested that you have an abdominal aorta duplex. During this test, an ultrasound is used to evaluate the aorta. Allow 30 minutes for this exam. Do not eat after midnight the day before and avoid carbonated beverages.  Please note: We ask at that you not bring children with you during ultrasound (echo/ vascular) testing. Due to room size and safety concerns, children are not allowed in the ultrasound rooms during exams. Our front office staff cannot provide observation of children in our lobby area while testing is being conducted. An adult accompanying a patient to their appointment will only be allowed in the ultrasound room at the discretion of the ultrasound technician under special circumstances. We apologize for any inconvenience.    Follow-Up: At [facility], you and your health needs are our priority.  As part of our continuing mission to provide you with exceptional heart care, we have created designated Provider Care Teams.  These Care Teams include your primary Cardiologist (physician) and Advanced Practice Providers (APPs -  Physician Assistants and Nurse Practitioners) who all work together to provide you with the care you need, when you need it.   Your next appointment:   8 month(s)  Provider:   Christell Constant, MD

## 2023-09-11 NOTE — Progress Notes (Unsigned)
 I, Rolland Bimler am a scribe for Dr. Denyse Amass.    [name] is a 68 y.o. male who presents to [facility] at [name] today for back pain  6-7/10. Pt locates pain to base of head all the way down to butt. Right thigh pain cause unknown. Knees click when he does sit to stand motion. Has sleep discomfort. Tried topicals for pain but he does not seen any progress. Trying to lose weight to take off some of the pressure.   Patient spends part of his time in [place] at [place] [facility].  He will be there for the next month.  Radiating pain:yes  LE numbness/tingling:yes in fingertip when neck is hurting LE weakness: no Aggravates: walking and standing Treatments tried:red wine (none in two months), medical gummies,  Pertinent review of systems: No fevers or chills  Relevant historical information: Hypertension diabetes heart failure diabetes with pressure ulcer on his toe, chronic neck and back pain.   Exam:  BP 102/60   Pulse 83   Ht 6' (1.829 m)   Wt 295 lb (133.8 kg)   SpO2 96%   BMI 40.01 kg/m  General: Well Developed, well nourished, and in no acute distress.   MSK:  C-spine: Normal appearing Decreased cervical motion.  Upper extremity strength is intact.  L-spine normal appearing nontender to palpation midline decreased lumbar motion.  Lower extremity strength is intact.  Right hip normal-appearing normal motion intact strength especially to flexion.  Bilateral knees decreased range of motion.    Lab and Radiology Results  X-ray images cervical spine lumbar spine and right hip and both knees obtained today personally and independently interpreted.  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  No acute fractures are visible.  Lumbar spine: Multilevel DDD.  No acute fractures.  Aortic atherosclerosis is present.  Right hip: Right hip osteoarthritis.  No acute fractures.  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  Left  knee: Mild lateral DJD.  Moderate patellofemoral DJD.  Await formal radiology review     Assessment and Plan: 68 y.o. male with long-term chronic neck and low back pain.  Additionally patient has right hip pain and bilateral knee pain.  He has degenerative changes in all of the locations of his pain which are certainly contributory.  Especially for his neck and back physical therapy could be beneficial.  Plan to refer to PT.  For the next month he is going to be in [place] and will use a physical therapy location in [place] [facility].  When he returns to [place] we can switch physical therapy to a location here in [place] [facility] [employer].  As for his hip and knee pain PT could be helpful happy to do injections into knee these locations as needed.  Consider advanced imaging in the future.  Recheck in about 1 month when he is back in town.   PDMP not reviewed this encounter. Orders Placed This Encounter  Procedures   DG Cervical Spine 2 or 3 views    Standing Status:   Future    Number of Occurrences:   1    Expiration Date:   09/11/2024    Reason for Exam (SYMPTOM  OR DIAGNOSIS REQUIRED):   neck pain    Preferred imaging location?:    [facility]   DG Lumbar Spine 2-3 Views    Standing Status:   Future    Number of Occurrences:  1    Expiration Date:   10/13/2023    Reason for Exam (SYMPTOM  OR DIAGNOSIS REQUIRED):   low back pain    Preferred imaging location?:   [facility]   DG HIP UNILAT W OR W/O PELVIS 2-3 VIEWS RIGHT    Standing Status:   Future    Number of Occurrences:   1    Expiration Date:   10/13/2023    Reason for Exam (SYMPTOM  OR DIAGNOSIS REQUIRED):   right hip pain    Preferred imaging location?:   [facility]   DG Knee AP/LAT W/Sunrise Left    Standing Status:   Future    Number of Occurrences:   1    Expiration Date:   10/13/2023    Reason for Exam (SYMPTOM  OR DIAGNOSIS REQUIRED):   bilateral knee  pain    Preferred imaging location?:   [facility]   DG Knee AP/LAT W/Sunrise Right    Standing Status:   Future    Number of Occurrences:   1    Expiration Date:   10/13/2023    Reason for Exam (SYMPTOM  OR DIAGNOSIS REQUIRED):   bilateral knee pain    Preferred imaging location?:   [facility]   Ambulatory referral to Physical Therapy    Referral Priority:   Routine    Referral Type:   Physical Medicine    Referral Reason:   Specialty Services Required    Requested Specialty:   Physical Therapy    Number of Visits Requested:   1   No orders of the defined types were placed in this encounter.    Discussed warning signs or symptoms. Please see discharge instructions. Patient expresses understanding.   The above documentation has been reviewed and is accurate and complete Clementeen Graham, M.D.

## 2023-09-11 NOTE — Progress Notes (Signed)
 Cardiology Office Note:    Date:  09/11/2023   ID:  [name], DOB Sep 24, 1955, MRN [id number]  PCP:  Ardith Dark, MD   [facility] Providers Cardiologist:  Christell Constant, MD     Referring MD: Ardith Dark, MD   CC: CAD follow up  History of Present Illness:    [name] is a 68 y.o. male with a hx of mild non obstructive CAD, AAA,Morbid Obesity, DM, and HTN; OSA on CPAP COVID-19 2022: patient started on lasix with some improved symptoms, then needed torsemide, preserved EF  2023: CAD and had PCI, CPET suggestive of deconditioning, saw pulm, and V/Q scan.   Called with SOB.  Seen PT advice notes: he met a cardiologist at [facility] that helped him through the airport.  Decreased down to two glass of wine. Stopped BB.  Re-offered sleep study.  Completed cardiac rehab. 2024: Is back to being tired again (had increased alcohol consumption). Planned for trip to [place].  [name] is a 68 year old male with coronary artery disease, hypertension, and diabetes who presents with worsening fatigue and shortness of breath related to alcohol use.  He experiences worsening fatigue and shortness of breath, which he associates with alcohol use. These symptoms improve when he abstains from alcohol and engage in other therapies. He has a history of coronary artery disease, hypertension, and diabetes, which are relevant to his current symptoms.  He has a history of obstructive sleep apnea, managed with CPAP, and obstructive lung disease. He is a former tobacco user. He experiences dizziness with SGLT2 inhibitors and diarrhea with MRAs. He has had both hypertension and hypotension, with symptomatic hypotension occurring in the past when on high doses of medications.  He has a mild thoracic aortic aneurysm, with plans for re-imaging in 2025. He is preparing for a [place] cruise with his wife.   Past Medical History:  Diagnosis Date   Abdominal aortic aneurysm  (AAA), 30-34 mm diameter (HCC) 06/01/2017   Nml w/ greatest dm 3 cm US Aorta 06/2016, Abd [place] 05/01/2017 showed infrarenal dilatation at 3.4 cm - rec repeat US in 3 yrs.   Allergy    "pollen"   Arthritis    Cataract    removed,bilateral   Cervical disc disease 12/26/2011   Coronary artery disease involving native coronary artery of native heart without angina pectoris 12/01/2016   He had coronary angiography in 2011. There was irregularity within the LAD with up to 30% narrowing. The myocardial perfusion imaging done 11/07/2015 did not demonstrate any evidence of ischemia with a low risk nuclear stress test other than EF estimated at 47%. Chest CT 01/25/2016 showed diffuse coronary artery calcifications with heavy calcifications in the LAD. Pt seen by cardiology Dr. Mendel Ryder    Diabetes mellitus    Difficult intubation 04/05/2014   Dyslipidemia    Family history of anesthesia complication    pt states took days for his father to awaken after mask was used    Fatty liver 06/01/2017   [place] 05/01/2017   GERD (gastroesophageal reflux disease)    has been having acid reflux since recent endoscopy    H/O hiatal hernia    repair with lap band, now has ventral hernia midline abd   Hearing loss-aides 03/26/2012   Hyperlipidemia    Hypertension    Lapband APS + hiatal hernia repair Oct 2015 04/05/2014   Morbid obesity (HCC)    OSA (obstructive sleep apnea)  Rotator cuff tear    Seasonal allergies    Shortness of breath    pt states related to high BP meds with extended walking or climbling stairs   Sleep apnea     Past Surgical History:  Procedure Laterality Date   BIOPSY  09/20/2022   Procedure: BIOPSY;  Surgeon: Meridee Score Netty Starring., MD;  Location: [facility];  Service: Gastroenterology;;   CARDIAC CATHETERIZATION     approx 3 years ago    COLONOSCOPY WITH PROPOFOL N/A 03/10/2015   Procedure: COLONOSCOPY WITH PROPOFOL;  Surgeon: Rachael Fee, MD;  Location: [facility];  Service:  Endoscopy;  Laterality: N/A;   COLONOSCOPY WITH PROPOFOL N/A 09/20/2022   Procedure: COLONOSCOPY WITH PROPOFOL;  Surgeon: Meridee Score Netty Starring., MD;  Location: [facility];  Service: Gastroenterology;  Laterality: N/A;   colonscopy      2012   CORONARY ATHERECTOMY N/A 07/18/2021   Procedure: CORONARY ATHERECTOMY;  Surgeon: Corky Crafts, MD;  Location: [facility] INVASIVE CV LAB;  Service: Cardiovascular;  Laterality: N/A;   CORONARY STENT INTERVENTION N/A 07/18/2021   Procedure: CORONARY STENT INTERVENTION;  Surgeon: Corky Crafts, MD;  Location: [facility] INVASIVE CV LAB;  Service: Cardiovascular;  Laterality: N/A;   ESOPHAGOGASTRODUODENOSCOPY (EGD) WITH PROPOFOL N/A 03/10/2015   Procedure: ESOPHAGOGASTRODUODENOSCOPY (EGD) WITH PROPOFOL;  Surgeon: Rachael Fee, MD;  Location: [facility];  Service: Endoscopy;  Laterality: N/A;   HERNIA REPAIR     with gastric banding   LAPAROSCOPIC GASTRIC BANDING N/A 04/05/2014   Procedure: LAPAROSCOPIC GASTRIC BANDING;  Surgeon: Valarie Merino, MD;  Location: [facility] ORS;  Service: General;  Laterality: N/A;   MENISCUS REPAIR Left    left knee torn meniscus   RIGHT/LEFT HEART CATH AND CORONARY ANGIOGRAPHY N/A 07/18/2021   Procedure: RIGHT/LEFT HEART CATH AND CORONARY ANGIOGRAPHY;  Surgeon: Corky Crafts, MD;  Location: [facility] INVASIVE CV LAB;  Service: Cardiovascular;  Laterality: N/A;   VASECTOMY      Current Medications: Current Meds  Medication Sig   empagliflozin (JARDIANCE) 25 MG TABS tablet Take 25 mg by mouth daily.   potassium chloride SA (KLOR-CON M) 20 MEQ tablet Take 40 mEq by mouth 2 (two) times daily.   torsemide (DEMADEX) 20 MG tablet Take 40 mg by mouth daily.     Allergies:   Jardiance [empagliflozin] and Penicillins   Social History   Socioeconomic History   Marital status: Married    Spouse name: Not on file   Number of children: 2   Years of education: 16   Highest education level: Bachelor's degree (e.g., BA, AB, BS)   Occupational History   Occupation: owner    Comment: Games developer   Occupation: Retired  Tobacco Use   Smoking status: Former    Current packs/day: 0.00    Average packs/day: 4.0 packs/day for 20.0 years (80.0 ttl pk-yrs)    Types: Cigarettes    Start date: 07/19/1973    Quit date: 12/13/1991    Years since quitting: 31.7    Passive exposure: Never   Smokeless tobacco: Never  Vaping Use   Vaping status: Never Used  Substance and Sexual Activity   Alcohol use: Yes    Alcohol/week: 8.0 standard drinks of alcohol    Types: 8 Glasses of wine per week    Comment: 2 glasses of wine a day   Drug use: Yes    Types: Marijuana   Sexual activity: Not on file  Other Topics Concern   Not on file  Social  History Narrative   Not on file   Social Drivers of Health   Financial Resource Strain: Low Risk  (07/10/2023)   Overall Financial Resource Strain (CARDIA)    Difficulty of Paying Living Expenses: Not hard at all  Food Insecurity: No Food Insecurity (07/10/2023)   Hunger Vital Sign    Worried About Running Out of Food in the Last Year: Never true    Ran Out of Food in the Last Year: Never true  Transportation Needs: No Transportation Needs (07/10/2023)   PRAPARE - Administrator, Civil Service (Medical): No    Lack of Transportation (Non-Medical): No  Physical Activity: Inactive (07/10/2023)   Exercise Vital Sign    Days of Exercise per Week: 0 days    Minutes of Exercise per Session: 0 min  Stress: No Stress Concern Present (07/10/2023)   [employer] of Occupational Health - Occupational Stress Questionnaire    Feeling of Stress : Not at all  Social Connections: Moderately Integrated (07/10/2023)   Social Connection and Isolation Panel [NHANES]    Frequency of Communication with Friends and Family: Three times a week    Frequency of Social Gatherings with Friends and Family: Once a week    Attends Religious Services: 1 to 4 times per year    Active Member of  [employer] or Organizations: No    Attends Engineer, structural: Never    Marital Status: Married    Social: I took care of his wife Jacki Cones as well; they had two Community education officer; had two kids he is a day trader, most of their time is in [place]. [place], [place], one kid got married  Family History: The patient's family history includes Clotting disorder in his mother; Hypertension in his brother; Mesothelioma in his father; Skin cancer in his brother. There is no history of Colon cancer, Colon polyps, Crohn's disease, Esophageal cancer, Rectal cancer, Stomach cancer, or Ulcerative colitis. History of coronary artery disease notable for no members. History of heart failure notable for no members. History of arrhythmia notable for no members.  ROS:   Please see the history of present illness.     EKGs/Labs/Other Studies Reviewed:    The following studies were reviewed today:  EKG:   10/21/20: Sinus bradycardia rate 55 1st HB  Abdominal Aortic Duplex Date: 04/17/2019 Abdominal Aorta: There is evidence of abnormal dilatation of the distal  Abdominal aorta. The largest aortic measurement is 2.9 cm. The largest  aortic diameter remains essentially unchanged compared to prior exam.  Previous diameter measurement was 3.0 cm  obtained on 06/2016.   Cardiac Studies & Procedures   ______________________________________________________________________________________________ CARDIAC CATHETERIZATION  CARDIAC CATHETERIZATION 07/18/2021  Narrative   Prox RCA to Mid RCA lesion is 90% stenosed.  Heavily calcified lesion.   After orbital atherectomy, A drug-eluting stent was successfully placed using a STENT ONYX FRONTIER 4.0X34 and postdilated to 4.5 mm, optimized with intravascular ultrasound.   Post intervention, there is a 0% residual stenosis.   Mid RCA lesion is 75% stenosed.   A drug-eluting stent was successfully placed using a STENT ONYX FRONTIER 4.0X15, postdilated to 4.5 mm and  optimized with intravascular ultrasound.   Post intervention, there is a 0% residual stenosis.   The left ventricular systolic function is normal.   LV end diastolic pressure is normal.   The left ventricular ejection fraction is 55-65% by visual estimate.   There is no aortic valve stenosis.   Ao sat 94%, PA sat 74%,  PA pressure 25 of 10, mean PA pressure 14 mmHg, mean pulmonary capillary wedge pressure 9 mmHg, cardiac output 8.6 L/min, cardiac index 3.4.  Continue aggressive secondary prevention.  Normal right heart pressures.  I suspect his shortness of breath is likely multifactorial from coronary ischemia as well as obesity and deconditioning.  Weight loss will be beneficial.  Plan for aspirin therapy for 1 month.  Minimum clopidogrel for 6 months.  Okay to restart Xarelto tomorrow.  Would consider treating with clopidogrel for 12 months if the he has no bleeding problems with the combination of Xarelto.  Results conveyed to his wife Lawson Fiscal [phone]  Findings Coronary Findings Diagnostic  Dominance: Right  Left Anterior Descending The vessel exhibits minimal luminal irregularities.  Left Circumflex The vessel exhibits minimal luminal irregularities.  Right Coronary Artery Vessel is large. Prox RCA to Mid RCA lesion is 90% stenosed. The lesion is severely calcified. Mid RCA lesion is 75% stenosed. The lesion is moderately calcified.  Intervention  Prox RCA to Mid RCA lesion Atherectomy CATH VISTA GUIDE 6FR XBRCA guide catheter was inserted. WIRE VIPERWIRE COR FLEX .012 guidewire was used to cross lesion. Orbital atherectomy was performed using a CROWN DIAMONDBACK CLASSIC 1.25. Several paced beats were noted at the time of atherectomy. Stent CATH VISTA GUIDE 6FR XBRCA guide catheter was inserted. Lesion crossed with guidewire using a WIRE ASAHI PROWATER 180CM. Pre-stent angioplasty was performed using a BALLN SCOREFLEX 3.50X15. A drug-eluting stent was successfully placed using a  STENT ONYX FRONTIER 4.0X34. Stent strut is well apposed. Post-stent angioplasty was performed using a BALLN  [place] SAPPHIRE 4.5X12. Post-Intervention Lesion Assessment The intervention was successful. Pre-interventional TIMI flow is 3. Post-intervention TIMI flow is 3. No complications occurred at this lesion. Ultrasound (IVUS) was performed on the lesion post PCI using a CATHETER OPTICROSS HD. Stent well apposed. There is a 0% residual stenosis post intervention.  Mid RCA lesion Stent Lesion crossed with guidewire using a WIRE ASAHI PROWATER 180CM. Pre-stent angioplasty was not performed. A drug-eluting stent was successfully placed using a STENT ONYX FRONTIER 4.0X15. Stent strut is well apposed. Post-stent angioplasty was performed using a BALLN  [place] SAPPHIRE 4.5X12. Post-Intervention Lesion Assessment The intervention was successful. Pre-interventional TIMI flow is 3. Post-intervention TIMI flow is 3. No complications occurred at this lesion. Ultrasound (IVUS) was performed on the lesion post PCI using a CATHETER OPTICROSS HD. Stent well apposed. There is a 0% residual stenosis post intervention.   STRESS TESTS  MYOCARDIAL PERFUSION IMAGING 11/08/2015  Narrative  Nuclear stress EF: 47%. The left ventricular ejection fraction is mildly decreased (45-54%).  There was no ST segment deviation noted during stress.  This is a low risk study.  There is no evidence of ischemia or previous infarction   ECHOCARDIOGRAM  ECHOCARDIOGRAM COMPLETE 02/27/2022  Narrative ECHOCARDIOGRAM REPORT    Patient Name:   [name] Date of Exam: 02/27/2022 Medical Rec #:  [id number]        Height:       70.0 in Accession #:    [id number]       Weight:       305.0 lb Date of Birth:  12/07/1955        BSA:          2.497 m Patient Age:    65 years         BP:           123/73 mmHg Patient Gender: M  HR:           72 bpm. Exam Location:  [address]  Procedure: 2D Echo, Cardiac  Doppler, Color Doppler and Strain Analysis  Indications:    I50.30 heart failure with preserved EF  History:        Patient has prior history of Echocardiogram examinations, most recent 02/03/2021. CAD, AAA, Pulmonary embolus, Signs/Symptoms:Dyspnea; Risk Factors:Diabetes, Hypertension and Dyslipidemia.  Sonographer:    Clearence Ped RCS Referring Phys: [id number] Briarrose Shor A Maclaine Ahola  IMPRESSIONS   1. Left ventricular ejection fraction, by estimation, is 60 to 65%. The left ventricle has normal function. The left ventricle has no regional wall motion abnormalities. Left ventricular diastolic parameters are consistent with Grade I diastolic dysfunction (impaired relaxation). The average left ventricular global longitudinal strain is -23.7 %. The global longitudinal strain is normal. 2. Right ventricular systolic function is normal. The right ventricular size is normal. 3. The mitral valve is normal in structure. No evidence of mitral valve regurgitation. No evidence of mitral stenosis. 4. The aortic valve is tricuspid. There is mild calcification of the aortic valve. There is mild thickening of the aortic valve. Aortic valve regurgitation is not visualized. Aortic valve sclerosis is present, with no evidence of aortic valve stenosis. 5. The inferior vena cava is normal in size with greater than 50% respiratory variability, suggesting right atrial pressure of 3 mmHg.  Comparison(s): No significant change from prior study. Prior images reviewed side by side.  FINDINGS Left Ventricle: Left ventricular ejection fraction, by estimation, is 60 to 65%. The left ventricle has normal function. The left ventricle has no regional wall motion abnormalities. The average left ventricular global longitudinal strain is -23.7 %. The global longitudinal strain is normal. The left ventricular internal cavity size was normal in size. There is no left ventricular hypertrophy. Left ventricular diastolic parameters  are consistent with Grade I diastolic dysfunction (impaired relaxation).  Right Ventricle: The right ventricular size is normal. No increase in right ventricular wall thickness. Right ventricular systolic function is normal.  Left Atrium: Left atrial size was normal in size.  Right Atrium: Right atrial size was normal in size.  Pericardium: There is no evidence of pericardial effusion.  Mitral Valve: The mitral valve is normal in structure. No evidence of mitral valve regurgitation. No evidence of mitral valve stenosis.  Tricuspid Valve: The tricuspid valve is normal in structure. Tricuspid valve regurgitation is trivial. No evidence of tricuspid stenosis.  Aortic Valve: The aortic valve is tricuspid. There is mild calcification of the aortic valve. There is mild thickening of the aortic valve. Aortic valve regurgitation is not visualized. Aortic valve sclerosis is present, with no evidence of aortic valve stenosis.  Pulmonic Valve: The pulmonic valve was normal in structure. Pulmonic valve regurgitation is not visualized. No evidence of pulmonic stenosis.  Aorta: The aortic root is normal in size and structure.  Venous: The inferior vena cava is normal in size with greater than 50% respiratory variability, suggesting right atrial pressure of 3 mmHg.  IAS/Shunts: No atrial level shunt detected by color flow Doppler.   LEFT VENTRICLE PLAX 2D LVIDd:         3.90 cm   Diastology LVIDs:         2.90 cm   LV e' medial:    5.11 cm/s LV PW:         1.00 cm   LV E/e' medial:  19.4 LV IVS:        0.90 cm  LV e' lateral:   6.74 cm/s LVOT diam:     2.10 cm   LV E/e' lateral: 14.7 LV SV:         86 LV SV Index:   34        2D Longitudinal Strain LVOT Area:     3.46 cm  2D Strain GLS (A2C):   -22.0 % 2D Strain GLS (A3C):   -25.8 % 2D Strain GLS (A4C):   -23.3 % 2D Strain GLS Avg:     -23.7 %  RIGHT VENTRICLE RV Basal diam:  3.40 cm RV S prime:     11.40 cm/s TAPSE (M-mode): 2.0  cm  LEFT ATRIUM             Index        RIGHT ATRIUM           Index LA diam:        4.30 cm 1.72 cm/m   RA Area:     12.60 cm LA Vol (A2C):   65.6 ml 26.27 ml/m  RA Volume:   25.90 ml  10.37 ml/m LA Vol (A4C):   50.5 ml 20.22 ml/m LA Biplane Vol: 58.7 ml 23.50 ml/m AORTIC VALVE LVOT Vmax:   115.00 cm/s LVOT Vmean:  82.800 cm/s LVOT VTI:    0.247 m  AORTA Ao Root diam: 3.50 cm Ao Asc diam:  3.50 cm  MITRAL VALVE MV Area (PHT):              SHUNTS MV Decel Time:              Systemic VTI:  0.25 m MV E velocity: 99.00 cm/s   Systemic Diam: 2.10 cm MV A velocity: 133.00 cm/s MV E/A ratio:  0.74  Donato Schultz MD Electronically signed by Donato Schultz MD Signature Date/Time: 02/27/2022/11:45:09 AM    Final          ______________________________________________________________________________________________      EKG today: SR  Recent Labs: 07/11/2023: ALT 31; BUN 16; Creatinine, Ser 1.27; Hemoglobin 15.5; Platelets 191.0; Potassium 3.7; Sodium 140; TSH 1.53  Recent Lipid Panel    Component Value Date/Time   CHOL 163 07/11/2023 1211   CHOL 147 08/15/2018 1001   TRIG 322.0 (H) 07/11/2023 1211   HDL 41.50 07/11/2023 1211   HDL 54 08/15/2018 1001   CHOLHDL 4 07/11/2023 1211   VLDL 64.4 (H) 07/11/2023 1211   LDLCALC 57 07/11/2023 1211   LDLCALC 54 08/15/2018 1001   LDLDIRECT 44.0 09/03/2022 1106   Physical Exam:    VS:  BP (!) 140/60 (BP Location: Right Arm, Patient Position: Sitting, Cuff Size: Large)   Pulse 78   Ht 6' (1.829 m)   Wt 133.6 kg   SpO2 96%   BMI 39.95 kg/m     Wt Readings from Last 3 Encounters:  09/11/23 133.6 kg  07/11/23 (!) 136.7 kg  07/10/23 (!) 136.8 kg    Gen: No distress, morbid obesity Neck: No JVD Cardiac: No rubs or gallops, soft systolic murmur distant heart sounds, +2 radial pulses Respiratory: Clear to auscultation bilaterally, normal effort, normal  respiratory rate GI: Soft, nontender, non-distended  MS: non-pitting  edema;  moves all extremities Integument: Skin feels warm Neuro:  At time of evaluation, alert and oriented to person/place/time/situation  Psych: Normal affect, patient feels   ASSESSMENT:    1. Chronic heart failure with preserved ejection fraction (HCC)   2. Abdominal aortic aneurysm (AAA), 30-34 mm diameter (HCC)  3. Aortic atherosclerosis (HCC)   4. Hypertension associated with diabetes (HCC)   5. Coronary artery disease involving native coronary artery of native heart without angina pectoris       PLAN:    Coronary Artery Disease HLD Aortic atherosclerosis - TGS up- discussed Vascepa; fasting re-test - Coronary artery disease status post percutaneous intervention, currently well-managed with no acute issues.  HTN with Diabetes Mellitus - discussed diet and exercise care on his GLP-1 RA - with episodes of both hypertension and hypotension. Current blood - continue current therapy  Abdominal Aortic Aneurysm Mild thoracic aortic aneurysm with planned re-imaging in 2025, not urgent. - Re-image thoracic aortic aneurysm in 2025.If significant needs to stop ciprofloxacin  Obstructive Sleep Apnea - Obstructive sleep apnea managed with CPAP. Follow-up with Dr. Mayford Knife for ongoing management.  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  Concerns regarding medicines are outlined above.  Orders Placed This Encounter  Procedures   Basic metabolic panel   Lipid panel   EKG 12-Lead   VAS [place] AAA DUPLEX     No orders of the defined types were placed in this encounter.    Patient  Instructions  Medication Instructions:  Your physician recommends that you continue on your current medications as directed. Please refer to the Current Medication list given to you today.  *If you need a refill on your cardiac medications before your next appointment, please call your pharmacy*   Lab Work: FLP, BMP  If you have labs (blood work) drawn today and your tests are completely normal, you will receive your results only by: MyChart Message (if you have MyChart) OR A paper copy in the mail If you have any lab test that is abnormal or we need to change your treatment, we will call you to review the results.   Testing/Procedures: Your physician has requested that you have an abdominal aorta duplex. During this test, an ultrasound is used to evaluate the aorta. Allow 30 minutes for this exam. Do not eat after midnight the day before and avoid carbonated beverages.  Please note: We ask at that you not bring children with you during ultrasound (echo/ vascular) testing. Due to room size and safety concerns, children are not allowed in the ultrasound rooms during exams. Our front office staff cannot provide observation of children in our lobby area while testing is being conducted. An adult accompanying a patient to their appointment will only be allowed in the ultrasound room at the discretion of the ultrasound technician under special circumstances. We apologize for any inconvenience.    Follow-Up: At [facility], you and your health needs are our priority.  As part of our continuing mission to provide you with exceptional heart care, we have created designated Provider Care Teams.  These Care Teams include your primary Cardiologist (physician) and Advanced Practice Providers (APPs -  Physician Assistants and Nurse Practitioners) who all work together to provide you with the care you need, when you need it.   Your next appointment:   8 month(s)  Provider:   Christell Constant, MD  Signed, Christell Constant, MD  09/11/2023 10:07 AM    [facility]

## 2023-09-12 ENCOUNTER — Ambulatory Visit (INDEPENDENT_AMBULATORY_CARE_PROVIDER_SITE_OTHER)

## 2023-09-12 ENCOUNTER — Encounter: Payer: Self-pay | Admitting: Podiatry

## 2023-09-12 ENCOUNTER — Ambulatory Visit (INDEPENDENT_AMBULATORY_CARE_PROVIDER_SITE_OTHER): Admitting: Family Medicine

## 2023-09-12 ENCOUNTER — Ambulatory Visit (INDEPENDENT_AMBULATORY_CARE_PROVIDER_SITE_OTHER): Payer: Medicare Other | Admitting: Podiatry

## 2023-09-12 VITALS — BP 102/60 | HR 83 | Ht 72.0 in | Wt 295.0 lb

## 2023-09-12 DIAGNOSIS — M545 Low back pain, unspecified: Secondary | ICD-10-CM | POA: Diagnosis not present

## 2023-09-12 DIAGNOSIS — G8929 Other chronic pain: Secondary | ICD-10-CM | POA: Diagnosis not present

## 2023-09-12 DIAGNOSIS — M25562 Pain in left knee: Secondary | ICD-10-CM | POA: Diagnosis not present

## 2023-09-12 DIAGNOSIS — M25561 Pain in right knee: Secondary | ICD-10-CM | POA: Diagnosis not present

## 2023-09-12 DIAGNOSIS — L97512 Non-pressure chronic ulcer of other part of right foot with fat layer exposed: Secondary | ICD-10-CM | POA: Diagnosis not present

## 2023-09-12 DIAGNOSIS — M5090 Cervical disc disorder, unspecified, unspecified cervical region: Secondary | ICD-10-CM

## 2023-09-12 DIAGNOSIS — L03031 Cellulitis of right toe: Secondary | ICD-10-CM

## 2023-09-12 DIAGNOSIS — M25551 Pain in right hip: Secondary | ICD-10-CM | POA: Diagnosis not present

## 2023-09-12 DIAGNOSIS — M7989 Other specified soft tissue disorders: Secondary | ICD-10-CM

## 2023-09-12 LAB — BASIC METABOLIC PANEL WITH GFR
BUN/Creatinine Ratio: 10 (ref 10–24)
BUN: 15 mg/dL (ref 8–27)
CO2: 25 mmol/L (ref 20–29)
Calcium: 8.6 mg/dL (ref 8.6–10.2)
Chloride: 102 mmol/L (ref 96–106)
Creatinine, Ser: 1.45 mg/dL — ABNORMAL HIGH (ref 0.76–1.27)
Glucose: 100 mg/dL — ABNORMAL HIGH (ref 70–99)
Potassium: 3.8 mmol/L (ref 3.5–5.2)
Sodium: 144 mmol/L (ref 134–144)
eGFR: 52 mL/min/{1.73_m2} — ABNORMAL LOW (ref >59)

## 2023-09-12 LAB — LIPID PANEL
Chol/HDL Ratio: 3.5 ratio (ref 0.0–5.0)
Cholesterol, Total: 112 mg/dL (ref 100–199)
HDL: 32 mg/dL — ABNORMAL LOW (ref >39)
LDL Chol Calc (NIH): 55 mg/dL (ref 0–99)
Triglycerides: 140 mg/dL (ref 0–149)
VLDL Cholesterol Cal: 25 mg/dL (ref 5–40)

## 2023-09-12 MED ORDER — DOXYCYCLINE HYCLATE 100 MG PO TABS: 100.0000 mg | ORAL_TABLET | Freq: Two times a day (BID) | ORAL | 0 refills | Status: DC

## 2023-09-12 NOTE — Patient Instructions (Signed)
 Thank you for coming in today.   Please get an Xray today before you leave   I've referred you to Physical Therapy.  Let us know if you don't hear from them in one week.   Check back in 1 month, when you return to town.

## 2023-09-13 ENCOUNTER — Other Ambulatory Visit (HOSPITAL_COMMUNITY)

## 2023-09-15 ENCOUNTER — Encounter: Payer: Self-pay | Admitting: Internal Medicine

## 2023-09-15 NOTE — Progress Notes (Signed)
 Subjective: Chief Complaint  Patient presents with   RFC    RM#11 RFC / Right big toe ulcer swelling still present minor redness.    68 year old male presents today for evaluation of right big toe ulcer, swelling.  He does not report any pain although he does have neuropathy.  Does not report any ulcerations but he gets in the callus.  No drainage.  He has no new concerns today.  No fevers or chills.   Objective: AAO x3, NAD-wife is present DP/PT pulses palpable bilaterally, CRT less than 3 seconds There is localized edema to the hallux without any cellulitis present.  There is hyperkeratotic tissue with dried blood present plantar aspect the hallux and upon debridement there is preulcerative but there is no definitive skin breakdown identified otherwise.  There is no area of fluctuation or crepitation.  There is no malodor.  No ascending cellulitis. No pain with calf compression, swelling, warmth, erythema  Assessment: Ulceration right hallux, cellulitis-improving  Plan: X-rays were obtained and reviewed.  3 views of the foot were obtained.  There is evidence of cortical changes compared to prior x-rays to suggest osteomyelitis.  Appears to be stable.  No soft tissue emphysema.  Preulcerative callus -Medically necessary wound debridement was performed. Sharply debride the lesion with any complications or bleeding.  Also recommend Silvadene dressing change daily, offloading.  Right hallux cellulitis -Still some residual swelling but no erythema or warmth.  He is getting ready to go back to [place] so I gave him a prescription for doxycycline to take if needed.  Also discussed with him previously establishing care with a provider in [place].  I will follow back with him in May when he returns back to [place].  Vivi Barrack DPM

## 2023-09-16 ENCOUNTER — Encounter: Payer: Self-pay | Admitting: Family Medicine

## 2023-09-16 NOTE — Progress Notes (Signed)
Cervical spine x-ray shows mild arthritis

## 2023-09-16 NOTE — Progress Notes (Signed)
Lumbar spine x-ray shows medium arthritis

## 2023-09-22 ENCOUNTER — Encounter: Payer: Self-pay | Admitting: Family Medicine

## 2023-09-23 ENCOUNTER — Encounter: Payer: Self-pay | Admitting: Family Medicine

## 2023-09-23 ENCOUNTER — Other Ambulatory Visit: Payer: Self-pay | Admitting: *Deleted

## 2023-09-23 DIAGNOSIS — E1165 Type 2 diabetes mellitus with hyperglycemia: Secondary | ICD-10-CM

## 2023-09-23 MED ORDER — INSULIN GLARGINE-YFGN 100 UNIT/ML ~~LOC~~ SOPN: 140.0000 [IU] | PEN_INJECTOR | Freq: Every day | SUBCUTANEOUS | 1 refills | Status: DC

## 2023-09-23 MED ORDER — TIRZEPATIDE 5 MG/0.5ML ~~LOC~~ SOAJ
5.0000 mg | SUBCUTANEOUS | 1 refills | Status: DC
Start: 2023-09-23 — End: 2023-12-12

## 2023-09-23 NOTE — Telephone Encounter (Signed)
 Rx send to pharmacy

## 2023-09-23 NOTE — Progress Notes (Signed)
 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  You do have some drama at the base of the spine that could be a source of pain as well.  This is where the base of the spine is halfway formed with the sacrum.  This will cause pain in the low back.  Physical therapy could be helpful for this.  If not better enough we can get a better imaging test like an MRI.

## 2023-09-23 NOTE — Telephone Encounter (Signed)
 Ok to refill.  [name]. [name], MD 09/23/2023 12:08 PM

## 2023-09-23 NOTE — Progress Notes (Signed)
 Right knee x-ray shows mild arthritis.

## 2023-09-23 NOTE — Telephone Encounter (Signed)
**Note De-identified  [place] Obfuscation** Please advise 

## 2023-09-23 NOTE — Telephone Encounter (Signed)
 Please send in new prescription for insulin glargine per his message.  [name]. [name], MD 09/23/2023 12:08 PM

## 2023-09-23 NOTE — Progress Notes (Signed)
 Left knee x-ray no broken bones.  There is a little bone spur over the quadricep tendon that attaches to the kneecap.  Otherwise it looks okay.  Not a lot of arthritis.

## 2023-09-24 ENCOUNTER — Other Ambulatory Visit (HOSPITAL_COMMUNITY): Payer: Self-pay

## 2023-09-24 NOTE — Telephone Encounter (Signed)
 Does he need a prior authorization? It should be the same cost as the starting dose.  [name]. [name], MD 09/24/2023 12:26 PM

## 2023-09-24 NOTE — Telephone Encounter (Signed)
 Good afternoon after running a test claim a 1 month supply is $241.33 and 3 month supply is $723.98 and no PA is needed those prices are after insurance pay their portion.  This patient may benefit from the [facility] Prescription Payment Plan where they will divide the cost out over a period of time for him. If he's interested he can contact his insurance company by calling the Member Services number on the back of his card.

## 2023-09-24 NOTE — Telephone Encounter (Signed)
 Does pt need new PA for new dose Mounjaro 5mg ?

## 2023-09-24 NOTE — Telephone Encounter (Signed)
 Patient recently went up to 5mg  dose Monjaro and there seems to have been a prince increase that the patient notes is a little too much is there any other options for him at this time?

## 2023-09-25 ENCOUNTER — Other Ambulatory Visit: Payer: Self-pay | Admitting: *Deleted

## 2023-09-25 MED ORDER — INSULIN GLARGINE-YFGN 100 UNIT/ML ~~LOC~~ SOPN: 140.0000 [IU] | PEN_INJECTOR | Freq: Every day | SUBCUTANEOUS | 1 refills | Status: DC

## 2023-09-25 NOTE — Telephone Encounter (Signed)
Rx send to requested pharmacy  

## 2023-10-06 ENCOUNTER — Encounter: Payer: Self-pay | Admitting: Family Medicine

## 2023-10-07 ENCOUNTER — Other Ambulatory Visit: Payer: Self-pay | Admitting: *Deleted

## 2023-10-07 MED ORDER — TAMSULOSIN HCL 0.4 MG PO CAPS: ORAL_CAPSULE | ORAL | 0 refills | Status: DC

## 2023-10-14 ENCOUNTER — Ambulatory Visit: Admitting: Family Medicine

## 2023-10-14 ENCOUNTER — Ambulatory Visit: Admitting: Podiatry

## 2023-10-31 ENCOUNTER — Other Ambulatory Visit (HOSPITAL_COMMUNITY)

## 2023-10-31 NOTE — Progress Notes (Unsigned)
   Joanna Muck, PhD, LAT, ATC acting as a scribe for Garlan Juniper, MD.  JAYLEN ORNE is a 68 y.o. male who presents to [facility] today for f/u neck, low back, bilat knee, and R hip pain. Pt was last seen by Dr. Alease Hunter on 09/12/23 and was referred to PT in [place]. [place] (where he lives part time).   Today, pt reports felt PT was helpful. He notes some improvement in his pain. He c/o bilat knees, neck, low back, and new L thumb pain. He will only be in town for about 2-wks  Dx imaging: 09/12/23 R hip, L-spine, C-spine, R & L knee XR   Pertinent review of systems: No fevers or chills  Relevant historical information: Hypertension and diabetes   Exam:  BP 122/76   Pulse 71   Ht 6' (1.829 m)   Wt 287 lb (130.2 kg)   SpO2 97%   BMI 38.92 kg/m  General: Well Developed, well nourished, and in no acute distress.   MSK: Decreased cervical and lumbar motion.  Mild antalgic gait.       Assessment and Plan: 68 y.o. male with improving neck and low back pain hip pain foot pain knee pain.  Plan to continue physical therapy check back as needed.   PDMP not reviewed this encounter. No orders of the defined types were placed in this encounter.  No orders of the defined types were placed in this encounter.    Discussed warning signs or symptoms. Please see discharge instructions. Patient expresses understanding.   The above documentation has been reviewed and is accurate and complete Garlan Juniper, M.D.

## 2023-11-01 ENCOUNTER — Ambulatory Visit: Admitting: Family Medicine

## 2023-11-01 VITALS — BP 122/76 | HR 71 | Ht 72.0 in | Wt 287.0 lb

## 2023-11-01 DIAGNOSIS — M545 Low back pain, unspecified: Secondary | ICD-10-CM

## 2023-11-01 DIAGNOSIS — M25562 Pain in left knee: Secondary | ICD-10-CM

## 2023-11-01 DIAGNOSIS — M5090 Cervical disc disorder, unspecified, unspecified cervical region: Secondary | ICD-10-CM | POA: Diagnosis not present

## 2023-11-01 DIAGNOSIS — M25551 Pain in right hip: Secondary | ICD-10-CM

## 2023-11-01 DIAGNOSIS — G8929 Other chronic pain: Secondary | ICD-10-CM

## 2023-11-01 DIAGNOSIS — M25561 Pain in right knee: Secondary | ICD-10-CM

## 2023-11-01 NOTE — Patient Instructions (Addendum)
Thank you for coming in today.   Check back as needed 

## 2023-11-05 ENCOUNTER — Ambulatory Visit (INDEPENDENT_AMBULATORY_CARE_PROVIDER_SITE_OTHER): Admitting: Podiatry

## 2023-11-05 DIAGNOSIS — L84 Corns and callosities: Secondary | ICD-10-CM | POA: Diagnosis not present

## 2023-11-05 DIAGNOSIS — Z7901 Long term (current) use of anticoagulants: Secondary | ICD-10-CM

## 2023-11-05 DIAGNOSIS — M79675 Pain in left toe(s): Secondary | ICD-10-CM

## 2023-11-05 DIAGNOSIS — B351 Tinea unguium: Secondary | ICD-10-CM | POA: Diagnosis not present

## 2023-11-05 DIAGNOSIS — M79674 Pain in right toe(s): Secondary | ICD-10-CM

## 2023-11-07 ENCOUNTER — Encounter: Payer: Self-pay | Admitting: Internal Medicine

## 2023-11-07 ENCOUNTER — Ambulatory Visit (INDEPENDENT_AMBULATORY_CARE_PROVIDER_SITE_OTHER): Payer: Medicare Other | Admitting: Family Medicine

## 2023-11-07 ENCOUNTER — Ambulatory Visit (HOSPITAL_COMMUNITY)
Admission: RE | Admit: 2023-11-07 | Discharge: 2023-11-07 | Disposition: A | Discharge status: Home / Self Care | Source: Ambulatory Visit | Attending: Internal Medicine | Admitting: Internal Medicine

## 2023-11-07 ENCOUNTER — Ambulatory Visit: Payer: Self-pay | Admitting: Internal Medicine

## 2023-11-07 ENCOUNTER — Encounter: Payer: Self-pay | Admitting: Family Medicine

## 2023-11-07 VITALS — BP 96/62 | HR 71 | Temp 97.3°F | Ht 72.0 in | Wt 286.4 lb

## 2023-11-07 DIAGNOSIS — I152 Hypertension secondary to endocrine disorders: Secondary | ICD-10-CM

## 2023-11-07 DIAGNOSIS — N400 Enlarged prostate without lower urinary tract symptoms: Secondary | ICD-10-CM

## 2023-11-07 DIAGNOSIS — I714 Abdominal aortic aneurysm, without rupture, unspecified: Secondary | ICD-10-CM | POA: Diagnosis not present

## 2023-11-07 DIAGNOSIS — I5032 Chronic diastolic (congestive) heart failure: Secondary | ICD-10-CM | POA: Diagnosis not present

## 2023-11-07 DIAGNOSIS — Z7984 Long term (current) use of oral hypoglycemic drugs: Secondary | ICD-10-CM

## 2023-11-07 DIAGNOSIS — E1159 Type 2 diabetes mellitus with other circulatory complications: Secondary | ICD-10-CM | POA: Diagnosis not present

## 2023-11-07 DIAGNOSIS — E1165 Type 2 diabetes mellitus with hyperglycemia: Secondary | ICD-10-CM | POA: Diagnosis not present

## 2023-11-07 DIAGNOSIS — E1169 Type 2 diabetes mellitus with other specified complication: Secondary | ICD-10-CM | POA: Diagnosis not present

## 2023-11-07 DIAGNOSIS — E785 Hyperlipidemia, unspecified: Secondary | ICD-10-CM | POA: Diagnosis not present

## 2023-11-07 LAB — CBC
HCT: 43.8 % (ref 39.0–52.0)
Hemoglobin: 14.7 g/dL (ref 13.0–17.0)
MCHC: 33.5 g/dL (ref 30.0–36.0)
MCV: 91.2 fl (ref 78.0–100.0)
Platelets: 190 10*3/uL (ref 150.0–400.0)
RBC: 4.8 Mil/uL (ref 4.22–5.81)
RDW: 14 % (ref 11.5–15.5)
WBC: 5.8 10*3/uL (ref 4.0–10.5)

## 2023-11-07 LAB — COMPREHENSIVE METABOLIC PANEL WITH GFR
ALT: 26 U/L (ref 0–53)
AST: 28 U/L (ref 0–37)
Albumin: 4.4 g/dL (ref 3.5–5.2)
Alkaline Phosphatase: 61 U/L (ref 39–117)
BUN: 20 mg/dL (ref 6–23)
CO2: 32 meq/L (ref 19–32)
Calcium: 8.9 mg/dL (ref 8.4–10.5)
Chloride: 100 meq/L (ref 96–112)
Creatinine, Ser: 1.36 mg/dL (ref 0.40–1.50)
GFR: 53.41 mL/min — ABNORMAL LOW (ref >60.00)
Glucose, Bld: 143 mg/dL — ABNORMAL HIGH (ref 70–99)
Potassium: 3.6 meq/L (ref 3.5–5.1)
Sodium: 140 meq/L (ref 135–145)
Total Bilirubin: 0.5 mg/dL (ref 0.2–1.2)
Total Protein: 6.6 g/dL (ref 6.0–8.3)

## 2023-11-07 LAB — LIPID PANEL
Cholesterol: 124 mg/dL (ref 0–200)
HDL: 32.5 mg/dL — ABNORMAL LOW (ref >39.00)
LDL Cholesterol: 47 mg/dL (ref 0–99)
NonHDL: 91.45
Total CHOL/HDL Ratio: 4
Triglycerides: 221 mg/dL — ABNORMAL HIGH (ref 0.0–149.0)
VLDL: 44.2 mg/dL — ABNORMAL HIGH (ref 0.0–40.0)

## 2023-11-07 LAB — PSA: PSA: 1.31 ng/mL (ref 0.10–4.00)

## 2023-11-07 LAB — TSH: TSH: 1.78 u[IU]/mL (ref 0.35–5.50)

## 2023-11-07 LAB — HEMOGLOBIN A1C: Hgb A1c MFr Bld: 6.8 % — ABNORMAL HIGH (ref 4.6–6.5)

## 2023-11-07 MED ORDER — DILTIAZEM HCL ER 120 MG PO CP12: 120.0000 mg | ORAL_CAPSULE | Freq: Two times a day (BID) | ORAL | 3 refills | Status: DC

## 2023-11-07 NOTE — Assessment & Plan Note (Signed)
 Check lipids.  Follows with cardiology.  On Crestor  40 mg daily.

## 2023-11-07 NOTE — Assessment & Plan Note (Signed)
 Check A1c today.  His home sugars have been at goal.  He is doing well with his current regimen of insulin  glargine 140 units daily and Mounjaro  5 mg weekly.  Also on Jardiance  12.5 mg daily.  Not currently having any side effects.  Discussed with patient that we can continue to gradually wean down insulin  and increase dose of Mounjaro  as tolerated over the next several weeks to months.  Recheck A1c in 3 months depending on today's result he will follow-up with us  in a few weeks via MyChart

## 2023-11-07 NOTE — Patient Instructions (Signed)
 It was very nice to see you today!  Please start the lower dose diltiazem  120 mg daily.  Monitor your blood pressure at home and follow-up with us  in a few weeks.  We will continue your current dose of insulin  and Mounjaro .  Will check blood work today.  I will refer you to see the urologist in [facility].  Will see back in a few months.  Come back sooner if needed.  Return in about 3 months (around 02/07/2024) for Follow Up.   Take care, Dr Daneil Dunker  PLEASE NOTE:  If you had any lab tests, please let us  know if you have not heard back within a few days. You may see your results on mychart before we have a chance to review them but we will give you a call once they are reviewed by us .   If we ordered any referrals today, please let us  know if you have not heard from their office within the next week.   If you had any urgent prescriptions sent in today, please check with the pharmacy within an hour of our visit to make sure the prescription was transmitted appropriately.   Please try these tips to maintain a healthy lifestyle:  Eat at least 3 REAL meals and 1-2 snacks per day.  Aim for no more than 5 hours between eating.  If you eat breakfast, please do so within one hour of getting up.   Each meal should contain half fruits/vegetables, one quarter protein, and one quarter carbs (no bigger than a computer mouse)  Cut down on sweet beverages. This includes juice, soda, and sweet tea.   Drink at least 1 glass of water with each meal and aim for at least 8 glasses per day  Exercise at least 150 minutes every week.  2

## 2023-11-07 NOTE — Assessment & Plan Note (Signed)
 Blood pressure low today.  He is currently asymptomatic though would be reasonable for us  to de-escalate his antihypertensives.  Will switch his Cardizem  to 120 mg daily.  He will monitor at home and follow-up with us  in a few weeks.  Anticipate that we will be able to continue to wean down on antihypertensives as he continues to lose weight.

## 2023-11-07 NOTE — Telephone Encounter (Signed)
**Note De-identified  [place] Obfuscation** Please advise 

## 2023-11-07 NOTE — Progress Notes (Signed)
   [name] is a 68 y.o. male who presents today for an office visit.  Assessment/Plan:  Chronic Problems Addressed Today: Hypertension associated with diabetes (HCC) Blood pressure low today.  He is currently asymptomatic though would be reasonable for us  to de-escalate his antihypertensives.  Will switch his Cardizem  to 120 mg daily.  He will monitor at home and follow-up with us  in a few weeks.  Anticipate that we will be able to continue to wean down on antihypertensives as he continues to lose weight.  Dyslipidemia associated with type 2 diabetes mellitus (HCC) Check lipids.  Follows with cardiology.  On Crestor  40 mg daily.  T2DM (type 2 diabetes mellitus) (HCC) Check A1c today.  His home sugars have been at goal.  He is doing well with his current regimen of insulin  glargine 140 units daily and Mounjaro  5 mg weekly.  Also on Jardiance  12.5 mg daily.  Not currently having any side effects.  Discussed with patient that we can continue to gradually wean down insulin  and increase dose of Mounjaro  as tolerated over the next several weeks to months.  Recheck A1c in 3 months depending on today's result he will follow-up with us  in a few weeks via MyChart  BPH (benign prostatic hyperplasia) Still having ongoing issues with urinary frequency and urgency.  He did see urology at [facility] however would like to be seen at a different office.  Will also check labs today including PSA per patient request.  Will place referral for him to see [facility] urology.     Subjective:  HPI:  See A/P for status of chronic conditions.  Patient is here today for 52-month follow-up.  When I saw him last A1c was 9.0.  We plan on restarting Mounjaro  2.5 mg weekly.  We continue his tresiba  140 units daily and Jardiance  12.5 mg daily.  Since our last visit, insurance required him to switch from tresiba  to insulin  glargine. He has lost about 15 pounds since her last visit.  Is been trying to work on diet and  exercise.  Has no acute concerns today.  His home sugar readings are typically at goal in the upper 90s to low 100s.  He has been consistent with his medications.  No significant side effects.  He did have an episode of jock itch several weeks ago but this is since resolved with over-the-counter treatments.       Objective:  Physical Exam: BP 96/62   Pulse 71   Temp (!) 97.3 F (36.3 C) (Temporal)   Ht 6' (1.829 m)   Wt 286 lb 6.4 oz (129.9 kg)   SpO2 96%   BMI 38.84 kg/m   Wt Readings from Last 3 Encounters:  11/07/23 286 lb 6.4 oz (129.9 kg)  11/01/23 287 lb (130.2 kg)  09/12/23 295 lb (133.8 kg)    Gen: No acute distress, resting comfortably CV: Regular rate and rhythm with no murmurs appreciated Pulm: Normal work of breathing, clear to auscultation bilaterally with no crackles, wheezes, or rhonchi Neuro: Grossly normal, moves all extremities Psych: Normal affect and thought content      [name] M. Daneil Dunker, MD 11/07/2023 12:05 PM

## 2023-11-07 NOTE — Assessment & Plan Note (Signed)
 Still having ongoing issues with urinary frequency and urgency.  He did see urology at [facility] however would like to be seen at a different office.  Will also check labs today including PSA per patient request.  Will place referral for him to see [facility] urology.

## 2023-11-08 ENCOUNTER — Ambulatory Visit: Payer: Self-pay | Admitting: Family Medicine

## 2023-11-08 NOTE — Telephone Encounter (Signed)
 Looks like his cardiologist already responded to this.  Jinny Mounts. Daneil Dunker, MD 11/08/2023 10:20 AM

## 2023-11-08 NOTE — Progress Notes (Signed)
 A1c is much better controlled at 6.8.  We can continue with his current medication regimen.  He should continue to monitor his sugar at home.  We can recheck his A1c in 3 months however I would like for him to let us  know if he is interested in increasing his dose of Mounjaro  before next office visit.  The rest of his labs are all at goal.

## 2023-11-11 NOTE — Progress Notes (Signed)
 Subjective: Chief Complaint  Patient presents with   Nail Problem    RM 14 Patient is here for routine foot care.     68 year old male presents today for evaluation of right big toe ulcer, swelling.  He also states he needs his nails trimmed as is as they are thick and elongated.  He states the wound has been doing well he denies any drainage or pus.  Still keep Silvadene  on the wound daily.  He does not report any ulcerations.  No fevers or chills.  No other concerns.   Objective: AAO x3, NAD-wife is present DP/PT pulses palpable bilaterally, CRT less than 3 seconds Nails are hypertrophic, dystrophic, brittle, discolored, elongated 10. No surrounding redness or drainage. Tenderness nails 1-5 bilaterally.  Preulcerative hyperkeratotic lesion noted bilateral hallux right side worse than left but upon debridement there is no underlying ulceration, drainage or signs of infection and appears the wound is healed at this time.  There are no new ulcerations identified. No pain with calf compression, swelling, warmth, erythema  Assessment: Symptomatic onychomycosis, hyperkeratotic lesions  Plan: Symptomatic onychomycosis -Sharply debrided nails x 10 without any complications or bleeding.  Preulcerative calluses -Wounds of healed.  I sharply debrided hyperkeratotic lesions x 2 without any complications or bleeding.  Continue moisturizer, offloading. -Glucose control, daily foot inspection  Return in about 2 months (around 01/05/2024).  Charity Conch DPM

## 2023-11-13 ENCOUNTER — Encounter: Payer: Self-pay | Admitting: Family Medicine

## 2023-11-13 ENCOUNTER — Other Ambulatory Visit: Payer: Self-pay | Admitting: *Deleted

## 2023-11-13 NOTE — Telephone Encounter (Signed)
 Please verified Rx  Patient stated supposed to order Rx Cardizem  120mg   (24hr) daily  Pick up from pharmacy today Cardizem  120mg  BID (12 hr } Please advise

## 2023-11-14 MED ORDER — DILTIAZEM HCL ER COATED BEADS 120 MG PO CP24
120.0000 mg | ORAL_CAPSULE | Freq: Every day | ORAL | 3 refills | Status: DC
Start: 2023-11-14 — End: 2023-12-12

## 2023-11-18 ENCOUNTER — Encounter: Payer: Self-pay | Admitting: Family Medicine

## 2023-11-18 NOTE — Telephone Encounter (Signed)
Last refill done by historical provider  

## 2023-11-19 MED ORDER — EMPAGLIFLOZIN 25 MG PO TABS: 25.0000 mg | ORAL_TABLET | Freq: Every day | ORAL | 0 refills | Status: DC

## 2023-11-27 ENCOUNTER — Encounter: Payer: Self-pay | Admitting: Family Medicine

## 2023-11-27 ENCOUNTER — Encounter (INDEPENDENT_AMBULATORY_CARE_PROVIDER_SITE_OTHER): Payer: Self-pay | Admitting: Internal Medicine

## 2023-11-27 DIAGNOSIS — R0602 Shortness of breath: Secondary | ICD-10-CM

## 2023-11-28 NOTE — Telephone Encounter (Signed)
 Pt scheduled

## 2023-11-28 NOTE — Telephone Encounter (Signed)
 Please schedule a visit with PCP

## 2023-12-01 NOTE — Telephone Encounter (Signed)
 Chart review: - hx of AFL and OSA, MO on Mounjaro , HTN, CAD s/p intervention and HFpEF, calling in with fatigue and DOE - historically DOE and fatigue have been worse with alcohol excess (did his best when switching to gummies) with hypotension (PCP has decreased diltiazem ) and with volume overload - trial off of diltiazem  would be reasonable; ambulatory BP is recommended;  If no improvement may need earlier follow up with me or my team to consider diuretic escalation or re-evaluation - cannot exclude some sx related to GLP-1 therapy dose change but it is beyond my expertise  Please see the MyChart message reply(ies) for my assessment and plan.    This patient gave consent for this Medical Advice Message and is aware that it may result in a bill to [employer], as well as the possibility of receiving a bill for a co-payment or deductible. They are an established patient, but are not seeking medical advice exclusively about a problem treated during an in person or video visit in the last seven days. I did not recommend an in person or video visit within seven days of my reply.    I spent a total of 11 minutes cumulative time within 7 days through [employer].  Jann Melody, MD

## 2023-12-02 ENCOUNTER — Encounter: Payer: Self-pay | Admitting: Family Medicine

## 2023-12-02 ENCOUNTER — Ambulatory Visit: Admitting: Family Medicine

## 2023-12-02 VITALS — BP 97/63 | HR 76 | Temp 97.7°F | Ht 72.0 in | Wt 283.4 lb

## 2023-12-02 DIAGNOSIS — L570 Actinic keratosis: Secondary | ICD-10-CM | POA: Diagnosis not present

## 2023-12-02 DIAGNOSIS — N400 Enlarged prostate without lower urinary tract symptoms: Secondary | ICD-10-CM

## 2023-12-02 DIAGNOSIS — E1159 Type 2 diabetes mellitus with other circulatory complications: Secondary | ICD-10-CM | POA: Diagnosis not present

## 2023-12-02 DIAGNOSIS — I152 Hypertension secondary to endocrine disorders: Secondary | ICD-10-CM | POA: Diagnosis not present

## 2023-12-02 DIAGNOSIS — E1165 Type 2 diabetes mellitus with hyperglycemia: Secondary | ICD-10-CM

## 2023-12-02 DIAGNOSIS — Z7985 Long-term (current) use of injectable non-insulin antidiabetic drugs: Secondary | ICD-10-CM

## 2023-12-02 NOTE — Assessment & Plan Note (Signed)
Stable on Flomax 0.4 mg daily. 

## 2023-12-02 NOTE — Patient Instructions (Signed)
 It was very nice to see you today!  I think stopping the diltiazem  is a good idea.  Please monitor your blood pressure at home and follow-up with us  or Dr. Tita Form in a couple of weeks.   It is okay for you to continue to decrease your insulin .  Please monitor your blood sugar at home and let us  know if you have any issues with this.  Will recheck your A1c in a few months.  Return if symptoms worsen or fail to improve.   Take care, Dr Daneil Dunker  PLEASE NOTE:  If you had any lab tests, please let us  know if you have not heard back within a few days. You may see your results on mychart before we have a chance to review them but we will give you a call once they are reviewed by us .   If we ordered any referrals today, please let us  know if you have not heard from their office within the next week.   If you had any urgent prescriptions sent in today, please check with the pharmacy within an hour of our visit to make sure the prescription was transmitted appropriately.   Please try these tips to maintain a healthy lifestyle:  Eat at least 3 REAL meals and 1-2 snacks per day.  Aim for no more than 5 hours between eating.  If you eat breakfast, please do so within one hour of getting up.   Each meal should contain half fruits/vegetables, one quarter protein, and one quarter carbs (no bigger than a computer mouse)  Cut down on sweet beverages. This includes juice, soda, and sweet tea.   Drink at least 1 glass of water with each meal and aim for at least 8 glasses per day  Exercise at least 150 minutes every week.

## 2023-12-02 NOTE — Progress Notes (Signed)
   [name] is a 68 y.o. male who presents today for an office visit.  Assessment/Plan:  New/Acute Problems: Dyspnea on Exertion He stopped diltiazem  yesterday per direction of cardiology.  His blood pressure is low today which may be contributing.  Recently had labs a couple weeks ago-did not need repeat today.  Overall reassuring exam.  He will follow-up with cardiology if symptoms are not improving.  Chronic Problems Addressed Today: Hypertension associated with diabetes (HCC) Blood pressure low today.  Recently just discontinued his diltiazem  completely.  He will continue his lisinopril  10 mg daily for now.  He will monitor his blood pressure at home and follow-up with me or his cardiologist in the next 1 to 2 weeks.  T2DM (type 2 diabetes mellitus) (HCC) Last A1c was 6.8.  He is working on weaning down his dose of insulin  is now at 90 units daily.  He is also on Mounjaro  5 mg weekly and Jardiance  25 mg daily.  Will recheck A1c in a few months though he will continue to work down on his daily dose of insulin .  BPH (benign prostatic hyperplasia) Stable on Flomax  0.4 mg daily.  Actinic keratosis Cryotherapy again today.  See below procedure note.  He tolerated well.     Subjective:  HPI:  See Assessment / plan for status of chronic conditions.  Patient is here with dyspnea on exertion. This has been going on for few weeks and has been intermittent in nature.  Does not currently having his symptoms.  Recently was rolling in a recycling bin from the sidewalk and he had to stop 4 times to catch his breath.  He did reach out to his cardiologist who recommended that he discontinue his diltiazem .  He did this yesterday.  His home blood pressure meds have been on the lower side.  Also has a lesion on right forearm that he would like to have frozen today.       Objective:  Physical Exam: BP 97/63   Pulse 76   Temp 97.7 F (36.5 C) (Temporal)   Ht 6' (1.829 m)   Wt 283 lb 6.4  oz (128.5 kg)   SpO2 97%   BMI 38.44 kg/m   Wt Readings from Last 3 Encounters:  12/02/23 283 lb 6.4 oz (128.5 kg)  11/07/23 286 lb 6.4 oz (129.9 kg)  11/01/23 287 lb (130.2 kg)    Gen: No acute distress, resting comfortably CV: Regular rate and rhythm with no murmurs appreciated Pulm: Normal work of breathing, clear to auscultation bilaterally with no crackles, wheezes, or rhonchi Skin: A few scattered actinic keratosis noted on upper extremities. Neuro: Grossly normal, moves all extremities Psych: Normal affect and thought content  Cryotherapy Procedure Note  Pre-operative Diagnosis: Actinic keratosis  Locations: bilateral arms  Indications: Therapeutic  Procedure Details  Patient informed of risks (permanent scarring, infection, light or dark discoloration, bleeding, infection, weakness, numbness and recurrence of the lesion) and benefits of the procedure and verbal informed consent obtained.  The areas are treated with liquid nitrogen therapy, frozen until ice ball extended 3 mm beyond lesion, allowed to thaw, and treated again.  A total of 3 lesions were treated.  The patient tolerated procedure well.  The patient was instructed on post-op care, warned that there may be blister formation, redness and pain. Recommend OTC analgesia as needed for pain.  Condition: Stable  Complications: none.       Jinny Mounts. Daneil Dunker, MD 12/02/2023 12:39 PM

## 2023-12-02 NOTE — Assessment & Plan Note (Signed)
 Last A1c was 6.8.  He is working on weaning down his dose of insulin  is now at 90 units daily.  He is also on Mounjaro  5 mg weekly and Jardiance  25 mg daily.  Will recheck A1c in a few months though he will continue to work down on his daily dose of insulin .

## 2023-12-02 NOTE — Assessment & Plan Note (Signed)
 Cryotherapy again today.  See below procedure note.  He tolerated well.

## 2023-12-02 NOTE — Assessment & Plan Note (Signed)
 Blood pressure low today.  Recently just discontinued his diltiazem  completely.  He will continue his lisinopril  10 mg daily for now.  He will monitor his blood pressure at home and follow-up with me or his cardiologist in the next 1 to 2 weeks.

## 2023-12-05 ENCOUNTER — Encounter: Payer: Self-pay | Admitting: Family Medicine

## 2023-12-06 NOTE — Telephone Encounter (Signed)
 Unable to contact patient at this time, voice mail is full

## 2023-12-11 ENCOUNTER — Encounter: Payer: Self-pay | Admitting: Family Medicine

## 2023-12-12 ENCOUNTER — Other Ambulatory Visit: Payer: Self-pay | Admitting: *Deleted

## 2023-12-12 MED ORDER — TIRZEPATIDE 7.5 MG/0.5ML ~~LOC~~ SOAJ: 7.5000 mg | SUBCUTANEOUS | 0 refills | Status: DC

## 2023-12-12 NOTE — Addendum Note (Signed)
 Addended by: RANDY HAMP SAILOR on: 12/12/2023 05:23 PM   Modules accepted: Orders

## 2024-01-06 ENCOUNTER — Encounter: Payer: Self-pay | Admitting: Family Medicine

## 2024-01-06 NOTE — Telephone Encounter (Signed)
Please schedule an office visit for patient.

## 2024-01-08 ENCOUNTER — Ambulatory Visit: Payer: Self-pay

## 2024-01-08 NOTE — Telephone Encounter (Signed)
 See other messages

## 2024-01-08 NOTE — Telephone Encounter (Signed)
 See note

## 2024-01-08 NOTE — Telephone Encounter (Signed)
 Copied from CRM [id number]. Topic: Clinical - Red Word Triage >> Jan 08, 2024 10:57 AM Thersia BROCKS wrote: [facility] that prompted transfer to Nurse Triage:Patient called in stated he fell while in [place]  believes he has broke a rib, and also has a nasty cough   ----------------------------------------------------------------------- From previous Reason for Contact - Scheduling: Patient/patient representative is calling to schedule an appointment. Refer to attachments for appointment information. Reason for Disposition  [1] Fall AND [2] went to emergency department for evaluation or treatment  SEVERE coughing spells (e.g., whooping sound after coughing, vomiting after coughing)  Answer Assessment - Initial Assessment Questions 1. MECHANISM: How did the fall happen?     Patient reports tripping on a steep mud hill 2. DOMESTIC VIOLENCE AND ELDER ABUSE SCREENING: Did you fall because someone pushed you or tried to hurt you? If Yes, ask: Are you safe now?     no 3. ONSET: When did the fall happen? (e.g., minutes, hours, or days ago)     Patient reports the fall happened a week ago 4. LOCATION: What part of the body hit the ground? (e.g., back, buttocks, head, hips, knees, hands, head, stomach)     Fell on right side 5. INJURY: Did you hurt (injure) yourself when you fell? If Yes, ask: What did you injure? Tell me more about this? (e.g., body area; type of injury; pain severity)     Right shoulder and right knee 6. PAIN: Is there any pain? If Yes, ask: How bad is the pain? (e.g., Scale 0-10; or none, mild,      mild 7. SIZE: For cuts, bruises, or swelling, ask: How large is it? (e.g., inches or centimeters)      6-7 inches of bruising to right arm 9. OTHER SYMPTOMS: Do you have any other symptoms? (e.g., dizziness, fever, weakness; new-onset or worsening).      no 10. CAUSE: What do you think caused the fall (or falling)? (e.g., dizzy spell, tripped)        Tripped  Patient reports that while on a trip reports falling a week ago. Patient endorses that he fell on his right side. Was seen in UC and had an XR of shoulder. Patient reports concern that he might have cracked a rib but reports symptoms have gotten better. Mild pain at this time.  Answer Assessment - Initial Assessment Questions 1. ONSET: When did the cough begin?      3-4 days ago 2. SEVERITY: How bad is the cough today?      6 out of 10 in severity 3. SPUTUM: Describe the color of your sputum (e.g., none, dry cough; clear, white, yellow, green)     Brownish yellow sputum 4. HEMOPTYSIS: Are you coughing up any blood? If Yes, ask: How much? (e.g., flecks, streaks, tablespoons, etc.)     no 5. DIFFICULTY BREATHING: Are you having difficulty breathing? If Yes, ask: How bad is it? (e.g., mild, moderate, severe)      mild 6. FEVER: Do you have a fever? If Yes, ask: What is your temperature, how was it measured, and when did it start?     no 7. CARDIAC HISTORY: Do you have any history of heart disease? (e.g., heart attack, congestive heart failure)      2 stents, HTN 8. LUNG HISTORY: Do you have any history of lung disease?  (e.g., pulmonary embolus, asthma, emphysema)     Sleep apnea 9. PE RISK FACTORS: Do you have a history of blood clots? (or:  recent major surgery, recent prolonged travel, bedridden)     no 10. OTHER SYMPTOMS: Do you have any other symptoms? (e.g., runny nose, wheezing, chest pain)       congestion 12. TRAVEL: Have you traveled out of the country in the last month? (e.g., travel history, exposures)       Traveled to [place]  and got home in the last day or so.  Patient calling to report symptoms and to request antibiotic for his symptoms. Patient would prefer medication to be sent to his pharmacy. Discussed the need for patient to be seen in office-patient is willing if required but would like to see if provider will send in medication for  him before agreeing to come into the office. Patient is needing a follow up phone call.  Protocols used: Falls and Falling-A-AH, Cough - Acute Productive-A-AH

## 2024-01-09 ENCOUNTER — Other Ambulatory Visit: Payer: Self-pay | Admitting: *Deleted

## 2024-01-09 MED ORDER — DOXYCYCLINE HYCLATE 100 MG PO TABS: 100.0000 mg | ORAL_TABLET | Freq: Two times a day (BID) | ORAL | 0 refills | Status: DC

## 2024-01-09 NOTE — Telephone Encounter (Signed)
 Strongly recommend he come in for appointment if he is able to. It is ok to send in doxycycline  100 mg twice daily for 7 days if there are no slots available this week.  Worth HERO. Kennyth, MD 01/09/2024 7:59 AM

## 2024-01-11 ENCOUNTER — Encounter: Payer: Self-pay | Admitting: Family Medicine

## 2024-01-13 ENCOUNTER — Ambulatory Visit (INDEPENDENT_AMBULATORY_CARE_PROVIDER_SITE_OTHER): Admitting: Podiatry

## 2024-01-13 ENCOUNTER — Ambulatory Visit (INDEPENDENT_AMBULATORY_CARE_PROVIDER_SITE_OTHER)

## 2024-01-13 ENCOUNTER — Ambulatory Visit (INDEPENDENT_AMBULATORY_CARE_PROVIDER_SITE_OTHER): Admitting: Family Medicine

## 2024-01-13 ENCOUNTER — Encounter: Payer: Self-pay | Admitting: Physician Assistant

## 2024-01-13 ENCOUNTER — Encounter: Payer: Self-pay | Admitting: Family Medicine

## 2024-01-13 VITALS — BP 101/70 | HR 83 | Temp 97.9°F | Ht 72.0 in | Wt 283.0 lb

## 2024-01-13 DIAGNOSIS — L853 Xerosis cutis: Secondary | ICD-10-CM | POA: Insufficient documentation

## 2024-01-13 DIAGNOSIS — N529 Male erectile dysfunction, unspecified: Secondary | ICD-10-CM | POA: Insufficient documentation

## 2024-01-13 DIAGNOSIS — N400 Enlarged prostate without lower urinary tract symptoms: Secondary | ICD-10-CM

## 2024-01-13 DIAGNOSIS — M25511 Pain in right shoulder: Secondary | ICD-10-CM

## 2024-01-13 DIAGNOSIS — R413 Other amnesia: Secondary | ICD-10-CM | POA: Insufficient documentation

## 2024-01-13 DIAGNOSIS — R5383 Other fatigue: Secondary | ICD-10-CM

## 2024-01-13 DIAGNOSIS — M79674 Pain in right toe(s): Secondary | ICD-10-CM

## 2024-01-13 DIAGNOSIS — E1165 Type 2 diabetes mellitus with hyperglycemia: Secondary | ICD-10-CM

## 2024-01-13 DIAGNOSIS — Z7901 Long term (current) use of anticoagulants: Secondary | ICD-10-CM

## 2024-01-13 DIAGNOSIS — I152 Hypertension secondary to endocrine disorders: Secondary | ICD-10-CM

## 2024-01-13 DIAGNOSIS — R0781 Pleurodynia: Secondary | ICD-10-CM

## 2024-01-13 DIAGNOSIS — M79675 Pain in left toe(s): Secondary | ICD-10-CM | POA: Diagnosis not present

## 2024-01-13 DIAGNOSIS — E1159 Type 2 diabetes mellitus with other circulatory complications: Secondary | ICD-10-CM | POA: Diagnosis not present

## 2024-01-13 DIAGNOSIS — L84 Corns and callosities: Secondary | ICD-10-CM | POA: Diagnosis not present

## 2024-01-13 DIAGNOSIS — Z7985 Long-term (current) use of injectable non-insulin antidiabetic drugs: Secondary | ICD-10-CM

## 2024-01-13 DIAGNOSIS — B351 Tinea unguium: Secondary | ICD-10-CM | POA: Diagnosis not present

## 2024-01-13 LAB — CBC
HCT: 45.5 % (ref 39.0–52.0)
Hemoglobin: 15.1 g/dL (ref 13.0–17.0)
MCHC: 33.2 g/dL (ref 30.0–36.0)
MCV: 91.6 fl (ref 78.0–100.0)
Platelets: 215 K/uL (ref 150.0–400.0)
RBC: 4.97 Mil/uL (ref 4.22–5.81)
RDW: 15 % (ref 11.5–15.5)
WBC: 6.1 K/uL (ref 4.0–10.5)

## 2024-01-13 LAB — COMPREHENSIVE METABOLIC PANEL WITH GFR
ALT: 35 U/L (ref 0–53)
AST: 40 U/L — ABNORMAL HIGH (ref 0–37)
Albumin: 4.4 g/dL (ref 3.5–5.2)
Alkaline Phosphatase: 76 U/L (ref 39–117)
BUN: 23 mg/dL (ref 6–23)
CO2: 29 meq/L (ref 19–32)
Calcium: 8.9 mg/dL (ref 8.4–10.5)
Chloride: 100 meq/L (ref 96–112)
Creatinine, Ser: 1.41 mg/dL (ref 0.40–1.50)
GFR: 51.08 mL/min — ABNORMAL LOW (ref >60.00)
Glucose, Bld: 214 mg/dL — ABNORMAL HIGH (ref 70–99)
Potassium: 3.7 meq/L (ref 3.5–5.1)
Sodium: 139 meq/L (ref 135–145)
Total Bilirubin: 0.5 mg/dL (ref 0.2–1.2)
Total Protein: 6.8 g/dL (ref 6.0–8.3)

## 2024-01-13 MED ORDER — TADALAFIL 5 MG PO TABS: 5.0000 mg | ORAL_TABLET | Freq: Every day | ORAL | 11 refills | Status: AC

## 2024-01-13 MED ORDER — TIRZEPATIDE 7.5 MG/0.5ML ~~LOC~~ SOAJ: 7.5000 mg | SUBCUTANEOUS | 0 refills | Status: DC

## 2024-01-13 MED ORDER — LISINOPRIL 10 MG PO TABS: 10.0000 mg | ORAL_TABLET | Freq: Every day | ORAL | 3 refills | Status: DC

## 2024-01-13 NOTE — Patient Instructions (Addendum)
 It was very nice to see you today!  We will check X-rays today. Please let us  know if your pain is not improving.   We will check blood work today.   Please try the Cialis . Let us  know how this is working.   I will refer you to neurology for cognitive evaluation.   Please try using aveeno on your skin.   Return in about 3 months (around 04/14/2024).   Take care, Dr Kennyth  PLEASE NOTE:  If you had any lab tests, please let us  know if you have not heard back within a few days. You may see your results on mychart before we have a chance to review them but we will give you a call once they are reviewed by us .   If we ordered any referrals today, please let us  know if you have not heard from their office within the next week.   If you had any urgent prescriptions sent in today, please check with the pharmacy within an hour of our visit to make sure the prescription was transmitted appropriately.   Please try these tips to maintain a healthy lifestyle:  Eat at least 3 REAL meals and 1-2 snacks per day.  Aim for no more than 5 hours between eating.  If you eat breakfast, please do so within one hour of getting up.   Each meal should contain half fruits/vegetables, one quarter protein, and one quarter carbs (no bigger than a computer mouse)  Cut down on sweet beverages. This includes juice, soda, and sweet tea.   Drink at least 1 glass of water with each meal and aim for at least 8 glasses per day  Exercise at least 150 minutes every week.

## 2024-01-13 NOTE — Assessment & Plan Note (Addendum)
 Also had lengthy discussion with patient today regarding his concern for memory impairment.  He has noticed more lapses in his short-term memory recently.  He would like to be evaluated for Alzheimer's.  He does note that his parents had significant memory issues when they were in their early 47s.  He has noticed more difficulty with remembering details from conversations with his wife that he has had recently.  Will place referral to neurology today for further evaluation.

## 2024-01-13 NOTE — Assessment & Plan Note (Signed)
 Patient with intermittent itchy skin.  This seems to have worsened recently.  Exam notable for xerosis cutis.  Recommended topical emollient such as Aveeno.  Also advise he follow-up with dermatology to discuss soon as well.

## 2024-01-13 NOTE — Assessment & Plan Note (Signed)
 This is been an ongoing issue for a while.  He has difficulty with achieving and maintaining erections.  We did discuss potential treatment options.  He would be a good candidate for Cialis  especially in light of his concurrent BPH.  Will start 5 mg daily.  We discussed potential side effects.  Will let us  know if not improving and we can increase the dose as tolerated versus referral to urology at that point.

## 2024-01-13 NOTE — Assessment & Plan Note (Signed)
 Blood pressure at goal today on lisinopril  10 mg daily.  He will monitor at home and let us  know if he has any significant highs or lows.

## 2024-01-13 NOTE — Telephone Encounter (Signed)
 Discussed with patient today.

## 2024-01-13 NOTE — Progress Notes (Signed)
 [name] is a 68 y.o. male who presents today for an office visit.  Assessment/Plan:  New/Acute Problems: Right Rib Pain Will check x-ray to rule out fracture.  Respiratory exam is normal today.  No red flag signs or symptoms.  Right Shoulder Pain  Likely rotator cuff injury based on his recent trauma we will check x-ray today to rule out fracture.  We did discuss referral to PT or sports medicine however he would like to hold off on this for now.  Cough  No lung exam today.  Likely has postinfectious cough from recent URI.  Advised patient will take probably several weeks for this clears up completely.   Chronic Problems Addressed Today: Hypertension associated with diabetes (HCC) Blood pressure at goal today on lisinopril  10 mg daily.  He will monitor at home and let us  know if he has any significant highs or lows.  T2DM (type 2 diabetes mellitus) (HCC) Doing very well with Mounjaro  7.5 mg weekly.  He is down to 80 units on insulin  daily.  Also on Jardiance  25 mg daily.  Too early to recheck A1c today.  Will recheck next time.  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  This seems to have worsened recently.  Exam notable for xerosis cutis.  Recommended topical emollient such as Aveeno.  Also advise he follow-up with dermatology to discuss soon as well.  Erectile dysfunction This is been an ongoing issue for a while.  He has difficulty with achieving and maintaining erections.  We did discuss potential treatment options.  He would be a good candidate for Cialis  especially in light of his concurrent BPH.  Will start 5 mg daily.  We discussed potential side effects.  Will let us  know if not improving and we can increase the dose as tolerated versus referral to urology at that point.  Memory impairment Also had lengthy discussion with patient today regarding his concern for memory impairment.  He has noticed more lapses in his short-term memory recently.  He would like  to be evaluated for Alzheimer's.  He does note that his parents had significant memory issues when they were in their early 42s.  He has noticed more difficulty with remembering details from conversations with his wife that he has had recently.  Will place referral to neurology today for further evaluation.     Subjective:  HPI:  See A/P for status of chronic conditions.  Patient is here today for follow-up.  He has a few issues that he would like to discuss today.  He was recently on vacation in [place]  and acquired upper respiratory infection (URI) while there.  We gave him a prescription for doxycycline  last week. He did have improvement with most of his upper respiratory infection (URI) symptoms though is still having some lingering cough.  No reported fevers or chills.  He also suffered a fall while in [place] and injured his right forearm, right shoulder, and right rib.He is still having some soreness in his right chest wall. He did get an xray  of his right shoulder that did not show any fractures.  Sudden persistent pain in his right shoulder and right chest wall       Objective:  Physical Exam: BP 101/70   Pulse 83   Temp 97.9 F (36.6 C) (Temporal)   Ht 6' (1.829 m)   Wt 283 lb (128.4 kg)   SpO2 95%   BMI 38.38 kg/m   Gen: No acute distress, resting comfortably  CV: Regular rate and rhythm with no murmurs appreciated Pulm: Normal work of breathing, clear to auscultation bilaterally with no crackles, wheezes, or rhonchi Skin: Xerosis cutis noted.  Neuro: Grossly normal, moves all extremities Psych: Normal affect and thought content      [name] Mcnay M. Kennyth, MD 01/13/2024 2:24 PM

## 2024-01-13 NOTE — Assessment & Plan Note (Signed)
 Doing very well with Mounjaro  7.5 mg weekly.  He is down to 80 units on insulin  daily.  Also on Jardiance  25 mg daily.  Too early to recheck A1c today.  Will recheck next time.

## 2024-01-14 ENCOUNTER — Telehealth: Payer: Self-pay | Admitting: Urology

## 2024-01-14 LAB — TSH: TSH: 1.74 u[IU]/mL (ref 0.35–5.50)

## 2024-01-14 LAB — TESTOSTERONE: Testosterone: 191.77 ng/dL — ABNORMAL LOW (ref 300.00–890.00)

## 2024-01-14 NOTE — Telephone Encounter (Signed)
 Called and spoke to wife Mitzie which is listed on DPR she stated she would make her husband aware and he would call back to r/s apt for 01/15/24 due to provider being out of office.

## 2024-01-15 ENCOUNTER — Ambulatory Visit: Admitting: Urology

## 2024-01-15 ENCOUNTER — Ambulatory Visit: Payer: Self-pay | Admitting: Family Medicine

## 2024-01-15 DIAGNOSIS — R7989 Other specified abnormal findings of blood chemistry: Secondary | ICD-10-CM

## 2024-01-15 NOTE — Progress Notes (Signed)
 Subjective: Chief Complaint  Patient presents with   Nail Problem    Pt stated that he is here to have his nails trimmed      68 year old male presents today for thick, elongated nails he is not able to trim himself as well as for calluses to both of his big toes.  He does not report any open sores or redness or drainage.  No new concerns.  Objective: AAO x3, NAD-wife is present DP/PT pulses palpable bilaterally, CRT less than 3 seconds Nails are hypertrophic, dystrophic, brittle, discolored, elongated 10. No surrounding redness or drainage. Tenderness nails 1-5 bilaterally.  Preulcerative hyperkeratotic lesion noted bilateral hallux right side worse than left.  On the right side there is some dried blood present in the callus but there is no skin breakdown identified otherwise.  No swelling, redness or any drainage. No pain with calf compression, swelling, warmth, erythema  Assessment: Symptomatic onychomycosis, hyperkeratotic lesions  Plan: Symptomatic onychomycosis -Sharply debrided nails x 10 without any complications or bleeding.  Preulcerative calluses -Wounds of healed, to remain preulcerative.  I sharply debrided hyperkeratotic lesions x 2 without any complications or bleeding.  Continue moisturizer, offloading. -Glucose control, daily foot inspection  Return in about 2 months (around 03/15/2024) for nail trim, callus .  Donnice JONELLE Fees DPM

## 2024-01-15 NOTE — Progress Notes (Signed)
 His testosterone  level is low.  Recommend he come back in for repeat test in a few weeks.  Insurance requires this before they will pay for any replacement therapy.  All of his other labs are stable.

## 2024-01-16 ENCOUNTER — Encounter: Payer: Self-pay | Admitting: Family Medicine

## 2024-01-16 ENCOUNTER — Other Ambulatory Visit (INDEPENDENT_AMBULATORY_CARE_PROVIDER_SITE_OTHER)

## 2024-01-16 ENCOUNTER — Ambulatory Visit: Payer: Self-pay | Admitting: Family Medicine

## 2024-01-16 DIAGNOSIS — E1165 Type 2 diabetes mellitus with hyperglycemia: Secondary | ICD-10-CM

## 2024-01-16 DIAGNOSIS — R7989 Other specified abnormal findings of blood chemistry: Secondary | ICD-10-CM

## 2024-01-16 LAB — TESTOSTERONE: Testosterone: 150.76 ng/dL — ABNORMAL LOW (ref 300.00–890.00)

## 2024-01-16 LAB — MICROALBUMIN / CREATININE URINE RATIO
Creatinine,U: 29.7 mg/dL
Microalb Creat Ratio: UNDETERMINED mg/g (ref 0.0–30.0)
Microalb, Ur: <0.7 mg/dL

## 2024-01-16 NOTE — Progress Notes (Signed)
 His testosterone  was low again but it needs to be space out but a couple of weeks per my last note. Please have him come back in 2 weeks.

## 2024-01-20 ENCOUNTER — Encounter: Payer: Self-pay | Admitting: Family Medicine

## 2024-01-20 NOTE — Telephone Encounter (Signed)
 See note

## 2024-01-20 NOTE — Telephone Encounter (Signed)
 Noted. He should let us  know if not improving.

## 2024-01-20 NOTE — Progress Notes (Signed)
 X-ray is negative for fracture in the right rib.

## 2024-01-20 NOTE — Telephone Encounter (Signed)
 MRI would be the next step though due to insurance regulations, typically this needs to be ordered by orthopedics or sports medicine.

## 2024-01-20 NOTE — Progress Notes (Signed)
 Shoulder x-ray does not show any fracture however does show degenerative changes consistent with arthritis.  X-ray also indicates that he may have some inflammation in his rotator cuff as well.  Recommend referral to sports medicine or orthopedics if his shoulder pain is not improving.

## 2024-01-21 NOTE — Telephone Encounter (Signed)
 See note

## 2024-01-21 NOTE — Telephone Encounter (Signed)
 Looks like they recommend he schedule an appointment with them.  Worth HERO. Kennyth, MD 01/21/2024 9:57 AM

## 2024-01-23 ENCOUNTER — Ambulatory Visit (INDEPENDENT_AMBULATORY_CARE_PROVIDER_SITE_OTHER): Admitting: Sports Medicine

## 2024-01-23 VITALS — BP 102/60 | HR 77 | Ht 72.0 in | Wt 278.6 lb

## 2024-01-23 DIAGNOSIS — M545 Low back pain, unspecified: Secondary | ICD-10-CM

## 2024-01-23 DIAGNOSIS — G8929 Other chronic pain: Secondary | ICD-10-CM

## 2024-01-23 DIAGNOSIS — M25511 Pain in right shoulder: Secondary | ICD-10-CM

## 2024-01-23 NOTE — Patient Instructions (Signed)
 Home Exercises for rotator cuff and low back. External referral to PT for shoulder and low back. Follow up as needed.

## 2024-01-23 NOTE — Progress Notes (Signed)
 [name] D.CLEMENTEEN [facility] [address] [place] [address] Phone: [phone]   Assessment and Plan:    1. Chronic right shoulder pain 2. Chronic bilateral low back pain without sciatica -Chronic with exacerbation, subsequent visit - Recurrence of multiple musculoskeletal pains after falling last month.  Most intense pain includes right shoulder pain with suspected rotator cuff tendinopathy versus partial tearing of supraspinatus.  Recurrence of chronic low back pain - Start HEP and physical therapy for low back and rotator cuff - Offered CSI, which patient declines at today's visit. - Use Tylenol  500 to 1000 mg tablets 2-3 times a day for day-to-day pain relief - Reviewed x-ray with patient in clinic.  My interpretation: No acute fracture or dislocation.  Mild degenerative changes in [facility] joint and glenohumeral joint.   15 additional minutes spent for educating Therapeutic Home Exercise Program.  This included exercises focusing on stretching, strengthening, with focus on eccentric aspects.   Long term goals include an improvement in range of motion, strength, endurance as well as avoiding reinjury. Patient's frequency would include in 1-2 times a day, 3-5 times a week for a duration of 6-12 weeks. Proper technique shown and discussed handout in great detail with ATC.  All questions were discussed and answered.    Pertinent previous records reviewed include right shoulder x-ray 01/13/2024  Follow Up: As needed when patient returns from [facility] [place] .  Could consider ultrasound versus advanced imaging of shoulder versus CSI   Subjective:   I, Claretha Schimke am a scribe for Dr. Leonce.   Chief Complaint: right shoulder pain  HPI:   01/23/24 Patient saw Dr. Joane on 11/01/23 for chronic low back pain. Mychart message on 01/20/2024 states that the patient fell a few weeks ago while on vacation. Right forearm right shoulder and right ribs are where  he is injured. Dr. Kennyth did x-rays of both shoulders and ribs. Patient states that doctor told him that nothing was cracked or broken. Patient states sore right should and sore rib. Has a tough time raising the arm up. Before he does any therapy he would like to know exactly what it is first.    Relevant Historical Information: DM type II, hypertension, CAD, OSA  Additional pertinent review of systems negative.   Current Outpatient Medications:    aspirin  EC 81 MG tablet, Take 81 mg by mouth daily. Swallow whole., Disp: , Rfl:    Cholecalciferol (VITAMIN D ) 50 MCG (2000 UT) tablet, Take 2,000 Units by mouth daily., Disp: , Rfl:    diphenhydrAMINE  (BENADRYL ) 25 MG tablet, Take 25 mg by mouth every 6 (six) hours as needed for itching or allergies., Disp: , Rfl:    empagliflozin  (JARDIANCE ) 25 MG TABS tablet, Take 1 tablet (25 mg total) by mouth daily., Disp: 90 tablet, Rfl: 0   fluticasone  (FLONASE ) 50 MCG/ACT nasal spray, Place 1 spray into both nostrils daily as needed for allergies., Disp: , Rfl:    glucose blood (TRUETEST TEST) test strip, Use as instructed, Disp: 100 each, Rfl: 2   insulin  glargine-yfgn (SEMGLEE ) 100 UNIT/ML Pen, Inject 140 Units into the skin daily., Disp: 126 mL, Rfl: 1   lisinopril  (ZESTRIL ) 10 MG tablet, Take 1 tablet (10 mg total) by mouth daily., Disp: 90 tablet, Rfl: 3   loratadine (CLARITIN) 10 MG tablet, Take 10 mg by mouth daily., Disp: , Rfl:    NON FORMULARY, Pt use a c-pap night, Disp: , Rfl:    Polyethyl Glycol-Propyl  Glycol (SYSTANE OP), Place 1 drop into both eyes as needed (dry eyes)., Disp: , Rfl:    potassium chloride  SA (KLOR-CON  M) 20 MEQ tablet, Take 40 mEq by mouth 2 (two) times daily., Disp: , Rfl:    rosuvastatin  (CRESTOR ) 40 MG tablet, TAKE ONE TABLET BY MOUTH ONE TIME DAILY, Disp: 90 tablet, Rfl: 2   silver  sulfADIAZINE  (SSD) 1 % cream, Apply 1 Application topically daily., Disp: , Rfl:    tadalafil  (CIALIS ) 5 MG tablet, Take 1 tablet (5 mg  total) by mouth daily., Disp: 30 tablet, Rfl: 11   tamsulosin  (FLOMAX ) 0.4 MG CAPS capsule, TAKE 1 CAPSULE BY MOUTH EVERY DAY 30 MINUTES AFTER THE SAME MEAL EACH DAY, Disp: 90 capsule, Rfl: 0   tirzepatide  (MOUNJARO ) 7.5 MG/0.5ML Pen, Inject 7.5 mg into the skin once a week., Disp: 2 mL, Rfl: 0   torsemide  (DEMADEX ) 20 MG tablet, Take 40 mg by mouth daily., Disp: , Rfl:    Objective:     Vitals:   01/23/24 1043  BP: 102/60  Pulse: 77  SpO2: 98%  Weight: 278 lb 9.6 oz (126.4 kg)  Height: 6' (1.829 m)      Body mass index is 37.78 kg/m.    Physical Exam:    Gen: Appears well, nad, nontoxic and pleasant Neuro:sensation intact, strength is 5/5 with df/pf/inv/ev, muscle tone wnl Skin: no suspicious lesion or defmority Psych: A&O, appropriate mood and affect  Right shoulder:  No deformity, swelling or muscle wasting No scapular winging FF 180, abd 160 with painful arc, int 10 with painful arc, ext 90 NTTP over the [place], clavicle, ac, coracoid, biceps groove, humerus, deltoid, trapezius, cervical spine Positive Hawkins, empty can, O'Brien, Speed's Neg neer,   subscap liftoff,  Neg ant drawer, sulcus sign, apprehension Negative Spurling's test bilat FROM of neck    Electronically signed by:  Odis Mace D.[facility] 11:11 AM 01/23/24

## 2024-01-27 ENCOUNTER — Ambulatory Visit: Payer: Medicare Other

## 2024-01-27 ENCOUNTER — Ambulatory Visit (INDEPENDENT_AMBULATORY_CARE_PROVIDER_SITE_OTHER)

## 2024-01-27 VITALS — Ht 72.0 in | Wt 278.0 lb

## 2024-01-27 DIAGNOSIS — Z0000 Encounter for general adult medical examination without abnormal findings: Secondary | ICD-10-CM

## 2024-01-27 NOTE — Patient Instructions (Signed)
 Mr. [name] , Thank you for taking time out of your busy schedule to complete your Annual Wellness Visit with me. I enjoyed our conversation and look forward to speaking with you again next year. I, as well as your care team,  appreciate your ongoing commitment to your health goals. Please review the following plan we discussed and let me know if I can assist you in the future. Your Game plan/ To Do List    Referrals: If you haven't heard from the office you've been referred to, please reach out to them at the phone provided.   Follow up Visits: We will see or speak with you next year for your Next Medicare AWV with our clinical staff Have you seen your provider in the last 6 months (3 months if uncontrolled diabetes)? Yes  Clinician Recommendations:  Each day, aim for 6 glasses of water, plenty of protein in your diet and try to get up and walk/ stretch every hour for 5-10 minutes at a time.        This is a list of the screenings recommended for you:  Health Maintenance  Topic Date Due   Complete foot exam   02/23/2023   Medicare Annual Wellness Visit  01/22/2024   Flu Shot  01/17/2024   Zoster (Shingles) Vaccine (1 of 2) 03/03/2024*   Pneumococcal Vaccine for age over 8 (2 of 2 - PCV) 12/01/2024*   Eye exam for diabetics  03/10/2024   Hemoglobin A1C  05/09/2024   Yearly kidney function blood test for diabetes  01/12/2025   Yearly kidney health urinalysis for diabetes  01/15/2025   DTaP/Tdap/Td vaccine (2 - Td or Tdap) 02/08/2025   Colon Cancer Screening  09/20/2027   Hepatitis C Screening  Completed   Hepatitis B Vaccine  Aged Out   HPV Vaccine  Aged Out   Meningitis B Vaccine  Aged Out   COVID-19 Vaccine  Discontinued  *Topic was postponed. The date shown is not the original due date.    Advanced directives: (Copy Requested) Please bring a copy of your health care power of attorney and living will to the office to be added to your chart at your convenience. You can mail to [facility] [address] [place], [place] [address] or email to [email or web address] Advance Care Planning is important because it:  [x]  Makes sure you receive the medical care that is consistent with your values, goals, and preferences  [x]  It provides guidance to your family and loved ones and reduces their decisional burden about whether or not they are making the right decisions based on your wishes.  Follow the link provided in your after visit summary or read over the paperwork we have mailed to you to help you started getting your Advance Directives in place. If you need assistance in completing these, please reach out to us  so that we can help you!  See attachments for Preventive Care and Fall Prevention Tips.

## 2024-01-27 NOTE — Progress Notes (Signed)
 Subjective:   [name] is a 68 y.o. who presents for a Medicare Wellness preventive visit.  As a reminder, Annual Wellness Visits don't include a physical exam, and some assessments may be limited, especially if this visit is performed virtually. We may recommend an in-person follow-up visit with your provider if needed.  Visit Complete: Virtual I connected with  [name] on 01/27/24 by a audio enabled telemedicine application and verified that I am speaking with the correct person using two identifiers.  Patient Location: Home  Provider Location: Office/Clinic  I discussed the limitations of evaluation and management by telemedicine. The patient expressed understanding and agreed to proceed.  Vital Signs: Because this visit was a virtual/telehealth visit, some criteria may be missing or patient reported. Any vitals not documented were not able to be obtained and vitals that have been documented are patient reported.  VideoDeclined- This patient declined Librarian, academic. Therefore the visit was completed with audio only.  Persons Participating in Visit: Patient.  AWV Questionnaire: Yes: Patient Medicare AWV questionnaire was completed by the patient on 01/26/24; I have confirmed that all information answered by patient is correct and no changes since this date.  Cardiac Risk Factors include: advanced age (>62men, >29 women);diabetes mellitus;dyslipidemia;hypertension;obesity (BMI >30kg/m2);male gender     Objective:    Today's Vitals   01/27/24 1302  Weight: 278 lb (126.1 kg)  Height: 6' (1.829 m)   Body mass index is 37.7 kg/m.     01/27/2024    1:06 PM 05/14/2023    8:26 AM 05/13/2023   10:23 AM 02/27/2023    1:30 PM 01/22/2023    2:03 PM 09/20/2022    1:36 PM 07/19/2021   11:00 AM  Advanced Directives  Does Patient Have a Medical Advance Directive? Yes Yes Yes Yes Yes No Yes  Type of Estate agent of  [place];Living will Healthcare Power of [place];Living will Healthcare Power of [place];Living will Healthcare Power of [place];Living will Healthcare Power of [place];Living will  Healthcare Power of [place];Living will  Does patient want to make changes to medical advance directive?  No - Patient declined No - Patient declined No - Patient declined   No - Patient declined  Copy of Healthcare Power of Attorney in Chart? No - copy requested No - copy requested  No - copy requested No - copy requested  No - copy requested    Current Medications (verified) Outpatient Encounter Medications as of 01/27/2024  Medication Sig   aspirin  EC 81 MG tablet Take 81 mg by mouth daily. Swallow whole.   Cholecalciferol (VITAMIN D ) 50 MCG (2000 UT) tablet Take 2,000 Units by mouth daily.   diphenhydrAMINE  (BENADRYL ) 25 MG tablet Take 25 mg by mouth every 6 (six) hours as needed for itching or allergies.   empagliflozin  (JARDIANCE ) 25 MG TABS tablet Take 1 tablet (25 mg total) by mouth daily.   fluticasone  (FLONASE ) 50 MCG/ACT nasal spray Place 1 spray into both nostrils daily as needed for allergies.   glucose blood (TRUETEST TEST) test strip Use as instructed   insulin  glargine-yfgn (SEMGLEE ) 100 UNIT/ML Pen Inject 140 Units into the skin daily.   lisinopril  (ZESTRIL ) 10 MG tablet Take 1 tablet (10 mg total) by mouth daily.   loratadine (CLARITIN) 10 MG tablet Take 10 mg by mouth daily.   NON FORMULARY Pt use a c-pap night   Polyethyl Glycol-Propyl Glycol (SYSTANE OP) Place 1 drop into both eyes as needed (dry eyes).  potassium chloride  SA (KLOR-CON  M) 20 MEQ tablet Take 40 mEq by mouth 2 (two) times daily.   rosuvastatin  (CRESTOR ) 40 MG tablet TAKE ONE TABLET BY MOUTH ONE TIME DAILY   silver  sulfADIAZINE  (SSD) 1 % cream Apply 1 Application topically daily.   tadalafil  (CIALIS ) 5 MG tablet Take 1 tablet (5 mg total) by mouth daily.   tirzepatide  (MOUNJARO ) 7.5 MG/0.5ML Pen Inject 7.5 mg into the skin  once a week.   torsemide  (DEMADEX ) 20 MG tablet Take 40 mg by mouth daily.   tamsulosin  (FLOMAX ) 0.4 MG CAPS capsule TAKE 1 CAPSULE BY MOUTH EVERY DAY 30 MINUTES AFTER THE SAME MEAL EACH DAY (Patient not taking: Reported on 01/27/2024)   No facility-administered encounter medications on file as of 01/27/2024.    Allergies (verified) Jardiance  [empagliflozin ] and Penicillins   History: Past Medical History:  Diagnosis Date   Abdominal aortic aneurysm (AAA), 30-34 mm diameter (HCC) 06/01/2017   Nml w/ greatest dm 3 cm US  Aorta 06/2016, Abd US  05/01/2017 showed infrarenal dilatation at 3.4 cm - rec repeat US  in 3 yrs.   Allergy    pollen   Arthritis    Cataract    removed,bilateral   Cervical disc disease 12/26/2011   Coronary artery disease involving native coronary artery of native heart without angina pectoris 12/01/2016   He had coronary angiography in 2011. There was irregularity within the LAD with up to 30% narrowing. The myocardial perfusion imaging done 11/07/2015 did not demonstrate any evidence of ischemia with a low risk nuclear stress test other than EF estimated at 47%. Chest CT 01/25/2016 showed diffuse coronary artery calcifications with heavy calcifications in the LAD. Pt seen by cardiology Dr. VEAR Sharps    Diabetes mellitus    Difficult intubation 04/05/2014   Dyslipidemia    Family history of anesthesia complication    pt states took days for his father to awaken after mask was used    Fatty liver 06/01/2017   US  05/01/2017   GERD (gastroesophageal reflux disease)    has been having acid reflux since recent endoscopy    H/O hiatal hernia    repair with lap band, now has ventral hernia midline abd   Hearing loss-aides 03/26/2012   Hyperlipidemia    Hypertension    Lapband APS + hiatal hernia repair Oct 2015 04/05/2014   Morbid obesity (HCC)    OSA (obstructive sleep apnea)    Rotator cuff tear    Seasonal allergies    Shortness of breath    pt states related to  high BP meds with extended walking or climbling stairs   Sleep apnea    Past Surgical History:  Procedure Laterality Date   BIOPSY  09/20/2022   Procedure: BIOPSY;  Surgeon: Wilhelmenia Aloha Raddle., MD;  Location: [facility];  Service: Gastroenterology;;   CARDIAC CATHETERIZATION     approx 3 years ago    COLONOSCOPY WITH PROPOFOL  N/A 03/10/2015   Procedure: COLONOSCOPY WITH PROPOFOL ;  Surgeon: Toribio SHAUNNA Cedar, MD;  Location: [facility];  Service: Endoscopy;  Laterality: N/A;   COLONOSCOPY WITH PROPOFOL  N/A 09/20/2022   Procedure: COLONOSCOPY WITH PROPOFOL ;  Surgeon: Wilhelmenia Aloha Raddle., MD;  Location: [facility];  Service: Gastroenterology;  Laterality: N/A;   colonscopy      2012   CORONARY ATHERECTOMY N/A 07/18/2021   Procedure: CORONARY ATHERECTOMY;  Surgeon: Dann Candyce RAMAN, MD;  Location: [facility] INVASIVE CV LAB;  Service: Cardiovascular;  Laterality: N/A;   CORONARY STENT INTERVENTION N/A 07/18/2021  Procedure: CORONARY STENT INTERVENTION;  Surgeon: Dann Candyce RAMAN, MD;  Location: [facility]VAS[facility] CV LAB;  Service: Cardiovascular;  Laterality: N/A;   ESOPHAGOGASTRODUODENOSCOPY (EGD) WITH PROPOFOL  N/A 03/10/2015   Procedure: ESOPHAGOGASTRODUODENOSCOPY (EGD) WITH PROPOFOL ;  Surgeon: Toribio SHAUNNA Cedar, MD;  Location: [facility];  Service: Endoscopy;  Laterality: N/A;   HERNIA REPAIR     with gastric banding   LAPAROSCOPIC GASTRIC BANDING N/A 04/05/2014   Procedure: LAPAROSCOPIC GASTRIC BANDING;  Surgeon: Donnice KATHEE Lunger, MD;  Location: [facility];  Service: General;  Laterality: N/A;   MENISCUS REPAIR Left    left knee torn meniscus   RIGHT/LEFT HEART CATH AND CORONARY ANGIOGRAPHY N/A 07/18/2021   Procedure: RIGHT/LEFT HEART CATH AND CORONARY ANGIOGRAPHY;  Surgeon: Dann Candyce RAMAN, MD;  Location: [facility] INVASIVE CV LAB;  Service: Cardiovascular;  Laterality: N/A;   VASECTOMY     Family History  Problem Relation Age of Onset   Clotting disorder Mother        lung clot    Mesothelioma Father    Hypertension Brother    Skin cancer Brother    Colon cancer Neg Hx    Colon polyps Neg Hx    Crohn's disease Neg Hx    Esophageal cancer Neg Hx    Rectal cancer Neg Hx    Stomach cancer Neg Hx    Ulcerative colitis Neg Hx    Social History   Socioeconomic History   Marital status: Married    Spouse name: Not on file   Number of children: 2   Years of education: 16   Highest education level: Bachelor's degree (e.g., BA, AB, BS)  Occupational History   Occupation: owner    Comment: Games developer   Occupation: Retired  Tobacco Use   Smoking status: Former    Current packs/day: 0.00    Average packs/day: 4.0 packs/day for 20.0 years (80.0 ttl pk-yrs)    Types: Cigarettes    Start date: 07/19/1973    Quit date: 12/13/1991    Years since quitting: 32.1    Passive exposure: Never   Smokeless tobacco: Never  Vaping Use   Vaping status: Never Used  Substance and Sexual Activity   Alcohol use: Yes    Alcohol/week: 8.0 standard drinks of alcohol    Types: 8 Glasses of wine per week    Comment: 2 glasses of wine a day   Drug use: Yes    Types: Marijuana   Sexual activity: Not on file  Other Topics Concern   Not on file  Social History Narrative   Not on file   Social Drivers of Health   Financial Resource Strain: Low Risk  (01/27/2024)   Overall Financial Resource Strain (CARDIA)    Difficulty of Paying Living Expenses: Not hard at all  Food Insecurity: No Food Insecurity (01/27/2024)   Hunger Vital Sign    Worried About Running Out of Food in the Last Year: Never true    Ran Out of Food in the Last Year: Never true  Transportation Needs: No Transportation Needs (01/27/2024)   PRAPARE - Administrator, Civil Service (Medical): No    Lack of Transportation (Non-Medical): No  Physical Activity: Sufficiently Active (01/27/2024)   Exercise Vital Sign    Days of Exercise per Week: 5 days    Minutes of Exercise per Session: 30 min  Recent  Concern: Physical Activity - Insufficiently Active (12/01/2023)   Exercise Vital Sign    Days of Exercise per Week: 2  days    Minutes of Exercise per Session: 30 min  Stress: No Stress Concern Present (01/27/2024)   [employer] of Occupational Health - Occupational Stress Questionnaire    Feeling of Stress: Not at all  Social Connections: Moderately Isolated (01/27/2024)   Social Connection and Isolation Panel    Frequency of Communication with Friends and Family: More than three times a week    Frequency of Social Gatherings with Friends and Family: More than three times a week    Attends Religious Services: Never    Database administrator or Organizations: No    Attends Engineer, structural: Never    Marital Status: Married    Tobacco Counseling Counseling given: Not Answered    Clinical Intake:  Pre-visit preparation completed: Yes  Pain : No/denies pain     BMI - recorded: 37.7 Nutritional Status: BMI > 30  Obese Nutritional Risks: None Diabetes: Yes CBG done?: Yes (91) CBG resulted in Enter/ Edit results?: No Did pt. bring in CBG monitor from home?: No  Lab Results  Component Value Date   HGBA1C 6.8 (H) 11/07/2023   HGBA1C 8.1 (H) 07/11/2023   HGBA1C 9.4 (H) 05/13/2023     How often do you need to have someone help you when you read instructions, pamphlets, or other written materials from your doctor or pharmacy?: 1 - Never  Interpreter Needed?: No  Information entered by :: Ellouise Haws, LPN   Activities of Daily Living     01/26/2024    1:26 PM 05/13/2023   10:28 AM  In your present state of health, do you have any difficulty performing the following activities:  Hearing? 1   Comment hearing impaired and has Have hearing aids   Vision? 0   Difficulty concentrating or making decisions? 0   Walking or climbing stairs? 0   Dressing or bathing? 0   Doing errands, shopping? 0 0  Preparing Food and eating ? N   Using the Toilet? N   In  the past six months, have you accidently leaked urine? N   Do you have problems with loss of bowel control? N   Managing your Medications? N   Managing your Finances? N   Housekeeping or managing your Housekeeping? N     Patient Care Team: Kennyth Worth HERO, MD as PCP - General (Family Medicine) Santo Stanly LABOR, MD as PCP - Cardiology (Cardiology) Claudene Victory ORN, MD (Inactive) as Consulting Physician (Cardiology) Teressa Toribio SQUIBB, MD (Inactive) as Attending Physician (Gastroenterology) Geronimo Amel, MD as Consulting Physician (Pulmonary Disease) Ivin Kocher, MD as Consulting Physician (Dermatology) Frutoso Luz, MD as Referring Physician (Allergy) Marlee Bernardino NOVAK, MD as Attending Physician (Nephrology) Floy Lynwood PARAS, MD (Inactive) as Consulting Physician (Otolaryngology) Cammie Batters, OD as Referring Physician (Optometry) Dena Charlie NOVAK, MD as Referring Physician (Internal Medicine)  I have updated your Care Teams any recent Medical Services you may have received from other providers in the past year.     Assessment:   This is a routine wellness examination for [facility].  Hearing/Vision screen Hearing Screening - Comments:: hearing impaired and has Have hearing aids Vision Screening - Comments:: Wears rx glasses - up to date with routine eye exams with Dr Harlene Cammie    Goals Addressed             This Visit's Progress    Patient Stated       Weight loss        Depression Screen  01/27/2024    1:07 PM 11/07/2023   11:27 AM 07/11/2023   11:21 AM 03/06/2023    9:27 AM 02/27/2023    2:16 PM 01/22/2023    2:03 PM 12/04/2022   10:45 AM  PHQ 2/9 Scores  PHQ - 2 Score 0 0 0 0 0 0 0  PHQ- 9 Score    3 3      Fall Risk     01/26/2024    1:26 PM 11/07/2023   11:27 AM 07/11/2023   11:21 AM 04/04/2023   10:33 AM 04/02/2023   11:17 AM  Fall Risk   Falls in the past year? 1 0 0 1 1  Number falls in past yr: 0 0 0 0 0  Injury with Fall? 0 0  0 0 0  Risk for fall due to : History of fall(s) No Fall Risks No Fall Risks History of fall(s) History of fall(s)  Follow up Falls prevention discussed   Falls evaluation completed;Falls prevention discussed Falls evaluation completed;Falls prevention discussed    MEDICARE RISK AT HOME:  Medicare Risk at Home Any stairs in or around the home?: (Patient-Rptd) Yes If so, are there any without handrails?: (Patient-Rptd) No Home free of loose throw rugs in walkways, pet beds, electrical cords, etc?: (Patient-Rptd) Yes Adequate lighting in your home to reduce risk of falls?: (Patient-Rptd) Yes Life alert?: (Patient-Rptd) No Use of a cane, walker or w/c?: (Patient-Rptd) Yes Grab bars in the bathroom?: (Patient-Rptd) No Shower chair or bench in shower?: (Patient-Rptd) Yes Elevated toilet seat or a handicapped toilet?: (Patient-Rptd) No  TIMED UP AND GO:  Was the test performed?  No  Cognitive Function: 6CIT completed        01/27/2024    1:10 PM 01/24/2023    8:54 AM  6CIT Screen  What Year? 0 points 0 points  What month? 0 points 0 points  What time? 0 points 0 points  Count back from 20 0 points 0 points  Months in reverse 0 points 0 points  Repeat phrase 0 points 0 points  Total Score 0 points 0 points    Immunizations Immunization History  Administered Date(s) Administered   Influenza, High Dose Seasonal PF 04/30/2018   Influenza,inj,Quad PF,6+ Mos 06/30/2016, 03/14/2017   Moderna Sars-Covid-2 Vaccination 10/14/2020   PFIZER(Purple Top)SARS-COV-2 Vaccination 09/10/2019, 10/05/2019   Pneumococcal Polysaccharide-23 10/09/2012   Tdap 02/09/2015    Screening Tests Health Maintenance  Topic Date Due   FOOT EXAM  02/23/2023   INFLUENZA VACCINE  01/17/2024   Zoster Vaccines- Shingrix (1 of 2) 03/03/2024 (Originally 03/04/1975)   Pneumococcal Vaccine: 50+ Years (2 of 2 - PCV) 12/01/2024 (Originally 10/09/2013)   OPHTHALMOLOGY EXAM  03/10/2024   HEMOGLOBIN A1C  05/09/2024    Diabetic kidney evaluation - eGFR measurement  01/12/2025   Diabetic kidney evaluation - Urine ACR  01/15/2025   Medicare Annual Wellness (AWV)  01/26/2025   DTaP/Tdap/Td (2 - Td or Tdap) 02/08/2025   Colonoscopy  09/20/2027   Hepatitis C Screening  Completed   Hepatitis B Vaccines  Aged Out   HPV VACCINES  Aged Out   Meningococcal B Vaccine  Aged Out   COVID-19 Vaccine  Discontinued    Health Maintenance  Health Maintenance Due  Topic Date Due   FOOT EXAM  02/23/2023   INFLUENZA VACCINE  01/17/2024   Health Maintenance Items Addressed: Diabetic Foot Exam recommended, See Nurse Notes at the end of this note  Additional Screening:  Vision Screening: Recommended annual  ophthalmology exams for early detection of glaucoma and other disorders of the eye. Would you like a referral to an eye doctor? No    Dental Screening: Recommended annual dental exams for proper oral hygiene  Community Resource Referral / Chronic Care Management: CRR required this visit?  No   CCM required this visit?  No   Plan:    I have personally reviewed and noted the following in the patient's chart:   Medical and social history Use of alcohol, tobacco or illicit drugs  Current medications and supplements including opioid prescriptions. Patient is not currently taking opioid prescriptions. Functional ability and status Nutritional status Physical activity Advanced directives List of other physicians Hospitalizations, surgeries, and ER visits in previous 12 months Vitals Screenings to include cognitive, depression, and falls Referrals and appointments  In addition, I have reviewed and discussed with patient certain preventive protocols, quality metrics, and best practice recommendations. A written personalized care plan for preventive services as well as general preventive health recommendations were provided to patient.   Ellouise VEAR Haws, LPN   1/88/7974   After Visit Summary: (MyChart)  Due to this being a telephonic visit, the after visit summary with patients personalized plan was offered to patient via MyChart   Notes: Nothing significant to report at this time.

## 2024-01-29 ENCOUNTER — Encounter: Payer: Self-pay | Admitting: Family Medicine

## 2024-01-30 NOTE — Telephone Encounter (Signed)
 Yes. Recommend he increase to 10 mg daily as needed and follow up with us  in a few weeks.

## 2024-01-30 NOTE — Telephone Encounter (Signed)
 Please schedule a lab appt  Future lab order

## 2024-01-30 NOTE — Telephone Encounter (Signed)
**Note De-identified  [place] Obfuscation** Please advise 

## 2024-02-03 ENCOUNTER — Other Ambulatory Visit (INDEPENDENT_AMBULATORY_CARE_PROVIDER_SITE_OTHER)

## 2024-02-03 ENCOUNTER — Ambulatory Visit: Payer: Self-pay | Admitting: Family Medicine

## 2024-02-03 DIAGNOSIS — R7989 Other specified abnormal findings of blood chemistry: Secondary | ICD-10-CM

## 2024-02-03 LAB — TESTOSTERONE: Testosterone: 242.44 ng/dL — ABNORMAL LOW (ref 300.00–890.00)

## 2024-02-03 NOTE — Progress Notes (Signed)
 Testosterone  is still low.  Recommend he schedule an appointment to discuss replacement options if he wishes to pursue testosterone  replacement therapy.

## 2024-02-04 NOTE — Telephone Encounter (Signed)
 Copied from CRM [id number]. Topic: Appointments - Scheduling Inquiry for Clinic >> Feb 04, 2024 11:23 AM Suzen RAMAN wrote: Reason for CRM: Patient would like to be worked in for an appt to discuss lab results today or tomorrow. Patient is planning to leave to go out of town on Thursday morning which is the provider first opening. Please contact patient to advised.   [phone] (M)

## 2024-02-05 ENCOUNTER — Telehealth: Payer: Self-pay | Admitting: Family Medicine

## 2024-02-05 NOTE — Telephone Encounter (Signed)
 Spoke with pt. Unable to come in this week. Will call once pt comes back in town to schedule ov or virtual

## 2024-02-05 NOTE — Telephone Encounter (Signed)
 Ok to schedule a virtual visit with Dr Kennyth

## 2024-02-14 ENCOUNTER — Encounter: Payer: Self-pay | Admitting: Family Medicine

## 2024-02-14 ENCOUNTER — Other Ambulatory Visit: Payer: Self-pay | Admitting: *Deleted

## 2024-02-14 MED ORDER — TIRZEPATIDE 7.5 MG/0.5ML ~~LOC~~ SOAJ: 7.5000 mg | SUBCUTANEOUS | 0 refills | Status: DC

## 2024-02-18 ENCOUNTER — Encounter: Payer: Self-pay | Admitting: Family Medicine

## 2024-02-18 NOTE — Telephone Encounter (Signed)
 Can we get more info? Is there a reason he would like to stop?  In terms of glycemic control, I think it would be fine for him to stop it however sometimes cardiology likes for patients to stay on this because it helps reduce risk of hospitalization due to heart failure, heart attack, and stroke.

## 2024-02-18 NOTE — Telephone Encounter (Signed)
**Note De-identified  [place] Obfuscation** Please advise 

## 2024-02-19 NOTE — Telephone Encounter (Signed)
 Spoke with Mr Lodge  Stated since been taking Mounjaro  he loss Wt, glucose been running between 95-125 Been off Jardiance  x 2 day Glucose to day was 95

## 2024-02-20 NOTE — Telephone Encounter (Signed)
 Noted.  He should continue to monitor and follow-up with us  in a few weeks.

## 2024-02-29 ENCOUNTER — Other Ambulatory Visit: Payer: Self-pay | Admitting: Internal Medicine

## 2024-03-03 ENCOUNTER — Other Ambulatory Visit: Payer: Self-pay | Admitting: *Deleted

## 2024-03-03 ENCOUNTER — Telehealth: Payer: Self-pay | Admitting: Family Medicine

## 2024-03-03 ENCOUNTER — Ambulatory Visit (INDEPENDENT_AMBULATORY_CARE_PROVIDER_SITE_OTHER): Admitting: Family Medicine

## 2024-03-03 ENCOUNTER — Encounter: Payer: Self-pay | Admitting: Family Medicine

## 2024-03-03 VITALS — BP 105/69 | HR 83 | Temp 97.7°F | Ht 72.0 in | Wt 282.4 lb

## 2024-03-03 DIAGNOSIS — E1165 Type 2 diabetes mellitus with hyperglycemia: Secondary | ICD-10-CM

## 2024-03-03 DIAGNOSIS — Z79890 Hormone replacement therapy (postmenopausal): Secondary | ICD-10-CM

## 2024-03-03 DIAGNOSIS — Z794 Long term (current) use of insulin: Secondary | ICD-10-CM | POA: Diagnosis not present

## 2024-03-03 DIAGNOSIS — I152 Hypertension secondary to endocrine disorders: Secondary | ICD-10-CM

## 2024-03-03 DIAGNOSIS — E291 Testicular hypofunction: Secondary | ICD-10-CM

## 2024-03-03 DIAGNOSIS — E1159 Type 2 diabetes mellitus with other circulatory complications: Secondary | ICD-10-CM

## 2024-03-03 DIAGNOSIS — R7989 Other specified abnormal findings of blood chemistry: Secondary | ICD-10-CM

## 2024-03-03 DIAGNOSIS — M25511 Pain in right shoulder: Secondary | ICD-10-CM

## 2024-03-03 MED ORDER — TIRZEPATIDE 10 MG/0.5ML ~~LOC~~ SOAJ: 10.0000 mg | SUBCUTANEOUS | 0 refills | Status: DC

## 2024-03-03 MED ORDER — TRUETEST TEST VI STRP: ORAL_STRIP | 2 refills | Status: AC

## 2024-03-03 MED ORDER — EMPAGLIFLOZIN 25 MG PO TABS: 25.0000 mg | ORAL_TABLET | Freq: Every day | ORAL | 1 refills | Status: DC

## 2024-03-03 MED ORDER — TESTOSTERONE 50 MG/5GM (1%) TD GEL: 5.0000 g | Freq: Every day | TRANSDERMAL | 3 refills | Status: DC

## 2024-03-03 MED ORDER — INSULIN PEN NEEDLE 29G X 4MM MISC: 1.0000 | Freq: Every day | 0 refills | Status: AC

## 2024-03-03 MED ORDER — LISINOPRIL 10 MG PO TABS: 10.0000 mg | ORAL_TABLET | Freq: Every day | ORAL | 3 refills | Status: AC

## 2024-03-03 NOTE — Patient Instructions (Addendum)
 It was very nice to see you today!  VISIT SUMMARY: During your visit, we discussed your concerns about testosterone  therapy, diabetes management, shoulder pain, toe pain, and other health issues. We reviewed your current treatments and made adjustments to better manage your conditions.  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 -We have prescribed Androgel  topical gel for testosterone  replacement therapy.  -We will recheck your testosterone  levels in two weeks.  TYPE 2 DIABETES MELLITUS: Your diabetes is managed with insulin , Mounjaro , and Jardiance . Stopping Jardiance  increased your blood sugar levels. -We will increase your Mounjaro  dose to help reduce your dependency on insulin  and Jardiance . -Continue taking Jardiance  and insulin  as prescribed. -We have prescribed 4mm needles from [employer] and test strips for blood glucose monitoring.  OBESITY: Weight management is important for your overall health. -Increasing your Mounjaro  dose will also aid in weight loss.  HYPERTENSION: Your high blood pressure is managed with multiple medications, but you experience dizziness when standing, likely due to low blood pressure. -We recommend holding off on taking lisinopril  for a few days to see if it helps with the dizziness. -Stay well hydrated to help manage your blood pressure.   CHRONIC RIGHT SHOULDER PAIN: You have ongoing shoulder pain, likely due to a rotator cuff tear and osteoarthritis. -Consider a referral to physical therapy if you desire. -Continue with the prescribed exercises to help manage the pain.  Return in about 3 months (around 06/02/2024).   Take care, Dr Kennyth  PLEASE NOTE:  If you had any lab tests, please let us  know if you have not heard back within a few days. You may see your results on mychart before we have a chance to review them but we will give you a call once they are  reviewed by us .   If we ordered any referrals today, please let us  know if you have not heard from their office within the next week.   If you had any urgent prescriptions sent in today, please check with the pharmacy within an hour of our visit to make sure the prescription was transmitted appropriately.   Please try these tips to maintain a healthy lifestyle:  Eat at least 3 REAL meals and 1-2 snacks per day.  Aim for no more than 5 hours between eating.  If you eat breakfast, please do so within one hour of getting up.   Each meal should contain half fruits/vegetables, one quarter protein, and one quarter carbs (no bigger than a computer mouse)  Cut down on sweet beverages. This includes juice, soda, and sweet tea.   Drink at least 1 glass of water with each meal and aim for at least 8 glasses per day  Exercise at least 150 minutes every week.

## 2024-03-03 NOTE — Addendum Note (Signed)
 Addended by: Finnley Lewis M on: 03/03/2024 02:56 PM   Modules accepted: Orders

## 2024-03-03 NOTE — Assessment & Plan Note (Addendum)
 Had lengthy discussion with patient today regarding his recent testosterone  levels.  He is additionally having issues with ongoing fatigue and decreased muscle mass.  We discussed risk and benefits of testosterone  replacement including increased risk for VTE, certain forms of cancer, and cardiovascular disease.  Discussed importance of routine monitoring as well.  He is agreeable to start testosterone  replacement.  Will start AndroGel  5 g daily.  He will come back in a couple of weeks to recheck CBC, testosterone , and PSA.

## 2024-03-03 NOTE — Progress Notes (Signed)
 [name] is a 68 y.o. male who presents today for an office visit.  Assessment/Plan:  New/Acute Problems: Right Shoulder Pain  Continue management per sports medicine.  This is still an ongoing issue.  We did discuss having him follow back up with sports medicine for advanced imaging or potential PT referral however he would like to hold off on this for now.  He will let us  know if he needs any further assistance.  Chronic Problems Addressed Today: Hypogonadism in male Had lengthy discussion with patient today regarding his recent testosterone  levels.  He is additionally having issues with ongoing fatigue and decreased muscle mass.  We discussed risk and benefits of testosterone  replacement including increased risk for VTE, certain forms of cancer, and cardiovascular disease.  Discussed importance of routine monitoring as well.  He is agreeable to start testosterone  replacement.  Will start AndroGel  5 g daily.  He will come back in a couple of weeks to recheck CBC, testosterone , and PSA.  Hypertension associated with diabetes (HCC) Blood pressure on low side today.  Occasionally getting intermittent dizziness.  We discussed importance of staying well-hydrated.  Otherwise feels well today.  He will try holding his lisinopril  for a few days and monitor his blood pressure at home and follow-up with us  in a couple weeks.  T2DM (type 2 diabetes mellitus) (HCC) Overall is doing well.  He is currently taking 80 units of insulin  glargine daily though home sugar readings are typically well-controlled.  Will refill his pen needles today and also refill his test strips.  Will increase his Mounjaro  to 10 mg weekly.  He will continue Jardiance  25 mg daily.  Recheck A1c next office visit.     Subjective:  HPI:  See assessment / plan for status of chronic conditions.  Discussed the use of AI scribe software for clinical note transcription with the patient, who gave verbal consent to  proceed.  History of Present Illness [name] is a 68 year old male with low testosterone  and diabetes who presents with concerns about testosterone  therapy and diabetes management.  He has ongoing shoulder pain, with prior evaluation by sports medicine suggesting a possible torn rotator cuff. The pain occurs with use, lasting two to four days, and disrupts sleep. There is no loss of range of motion, but he cannot bear weight on the hand. He has not been consistent with prescribed exercises and is considering physical therapy.  His toe is experiencing recurrent issues after walking in socks on floors, and he plans to see a podiatrist at the end of the month.  He has low testosterone  levels confirmed by multiple tests, experiencing muscle loss and erectile dysfunction. He is interested in testosterone  therapy, particularly a topical gel, and discussed the potential benefits for energy levels and muscle mass.  He manages diabetes with insulin , Jardiance , and Mounjaro . An attempt to stop Jardiance  led to increased blood sugar levels. He currently takes 80 units of insulin  and has noticed soreness at injection sites, having reduced his dose from 140 units. He is considering increasing his Mounjaro  dose as he tolerates it well and has experienced weight loss.  He experiences dizziness when standing, attributing it to low blood pressure. His blood pressure increased to 145/unknown when he missed his medications for a day. He is on torsemide  and lisinopril  for blood pressure management.  He reports occasional knee pain, attributing it to aging.         Objective:  Physical Exam: BP 105/69  Pulse 83   Temp 97.7 F (36.5 C) (Temporal)   Ht 6' (1.829 m)   Wt 282 lb 6.4 oz (128.1 kg)   SpO2 96%   BMI 38.30 kg/m   Wt Readings from Last 3 Encounters:  03/03/24 282 lb 6.4 oz (128.1 kg)  01/27/24 278 lb (126.1 kg)  01/23/24 278 lb 9.6 oz (126.4 kg)    Gen: No acute distress, resting  comfortably Neuro: Grossly normal, moves all extremities Psych: Normal affect and thought content      Shreshta Medley M. Kennyth, MD 03/03/2024 11:02 AM

## 2024-03-03 NOTE — Telephone Encounter (Signed)
 Order placed

## 2024-03-03 NOTE — Assessment & Plan Note (Signed)
 Blood pressure on low side today.  Occasionally getting intermittent dizziness.  We discussed importance of staying well-hydrated.  Otherwise feels well today.  He will try holding his lisinopril  for a few days and monitor his blood pressure at home and follow-up with us  in a couple weeks.

## 2024-03-03 NOTE — Assessment & Plan Note (Signed)
 Overall is doing well.  He is currently taking 80 units of insulin  glargine daily though home sugar readings are typically well-controlled.  Will refill his pen needles today and also refill his test strips.  Will increase his Mounjaro  to 10 mg weekly.  He will continue Jardiance  25 mg daily.  Recheck A1c next office visit.

## 2024-03-03 NOTE — Telephone Encounter (Signed)
 Pt is coming back in 2 weeks to recheck testosterone  and needs lab order to be put in. He states he would like for the orders to have an all panel. Please advise.

## 2024-03-04 ENCOUNTER — Other Ambulatory Visit: Payer: Self-pay | Admitting: Internal Medicine

## 2024-03-04 ENCOUNTER — Encounter: Payer: Self-pay | Admitting: Family Medicine

## 2024-03-04 ENCOUNTER — Encounter: Payer: Self-pay | Admitting: Podiatry

## 2024-03-04 NOTE — Telephone Encounter (Signed)
 Patient informed provider is out of office and would like to wait until Dr. Gershon can advise him.

## 2024-03-05 NOTE — Telephone Encounter (Signed)
 Has this been sent to the prior authorization team?  Worth HERO. Kennyth, MD 03/05/2024 8:19 AM

## 2024-03-05 NOTE — Telephone Encounter (Signed)
**Note De-identified  [place] Obfuscation** Please advise 

## 2024-03-06 NOTE — Telephone Encounter (Signed)
 Please start Rx androgel . PA

## 2024-03-09 ENCOUNTER — Telehealth: Payer: Self-pay

## 2024-03-09 ENCOUNTER — Other Ambulatory Visit (HOSPITAL_COMMUNITY): Payer: Self-pay

## 2024-03-09 NOTE — Telephone Encounter (Signed)
 Pharmacy Patient Advocate Encounter   Received notification from Patient Advice Request messages that prior authorization for Testosterone  50 MG/5GM(1%) gel is required/requested.   Insurance verification completed.   The patient is insured through [facility] [employer] .   Per test claim: PA required; PA submitted to above mentioned insurance via Latent Key/confirmation #/EOC [id number] Status is pending

## 2024-03-10 NOTE — Telephone Encounter (Signed)
 Pharmacy Patient Advocate Encounter  Received notification from [facility] that Prior Authorization for Testosterone  50 MG/5GM(1%) gel has been APPROVED from 03/03/2024 to further notice    PA #/Case ID/Reference #: [id number]

## 2024-03-11 ENCOUNTER — Encounter: Payer: Self-pay | Admitting: Urology

## 2024-03-11 ENCOUNTER — Ambulatory Visit: Admitting: Urology

## 2024-03-11 VITALS — BP 115/80 | HR 83 | Ht 72.0 in | Wt 275.0 lb

## 2024-03-11 DIAGNOSIS — N138 Other obstructive and reflux uropathy: Secondary | ICD-10-CM | POA: Diagnosis not present

## 2024-03-11 DIAGNOSIS — N401 Enlarged prostate with lower urinary tract symptoms: Secondary | ICD-10-CM | POA: Diagnosis not present

## 2024-03-11 DIAGNOSIS — E291 Testicular hypofunction: Secondary | ICD-10-CM | POA: Diagnosis not present

## 2024-03-11 LAB — URINALYSIS, ROUTINE W REFLEX MICROSCOPIC
Bilirubin, UA: NEGATIVE
Ketones, UA: NEGATIVE
Leukocytes,UA: NEGATIVE
Nitrite, UA: NEGATIVE
Protein,UA: NEGATIVE
Specific Gravity, UA: 1.01 (ref 1.005–1.030)
Urobilinogen, Ur: 0.2 mg/dL (ref 0.2–1.0)
pH, UA: 6 (ref 5.0–7.5)

## 2024-03-11 LAB — MICROSCOPIC EXAMINATION: Bacteria, UA: NONE SEEN

## 2024-03-11 LAB — BLADDER SCAN AMB NON-IMAGING

## 2024-03-11 MED ORDER — ALFUZOSIN HCL ER 10 MG PO TB24: 10.0000 mg | ORAL_TABLET | Freq: Every day | ORAL | 11 refills | Status: DC

## 2024-03-11 NOTE — Telephone Encounter (Signed)
 Patient notified Testosterone  50 MG/5GM(1%) gel has been APPROVED from 03/03/2024 to further notice

## 2024-03-11 NOTE — Progress Notes (Signed)
 Assessment: 1. BPH with obstruction/lower urinary tract symptoms   2. Hypogonadism in male     Plan: I personally reviewed the patient's chart including provider notes, lab results. I reviewed records from [facility]. Options for management of BPH with lower urinary tract symptoms discussed with the patient including medical therapy, minimally invasive procedures, and surgical management. Trial of alfuzosin  10 mg daily.  Prescription sent.  Use and side effects discussed. I also discussed treatment of hypogonadism with testosterone  replacement.  Recommend monitoring symptoms and repeating testosterone  level after he has used AndroGel  for 4-6 weeks. Return to office in 1 month for reevaluation.  Chief Complaint:  Chief Complaint  Patient presents with   Benign Prostatic Hypertrophy    History of Present Illness:  [name] is a 68 y.o. male who is seen in consultation from Kennyth Worth HERO, MD for evaluation of BPH with LUTS. He reports a several year history of lower urinary tract symptoms.  His symptoms include frequency, nocturia x 2, sensation of incomplete emptying, intermittent stream, and weak stream.  No dysuria or gross hematuria. IPSS = 17/3. He previously was on tamsulosin  but did not see improvement in his symptoms.  He reports stopping this approximately 6 months ago.  He did not see a change in his symptoms with discontinuation of the medication. He was seen by Dr. Shane about [facility] in January 2025. PVR = 255 mL Further evaluation with cystoscopy and prostate ultrasound recommended.  PSA levels have been normal. His last PSA from 5/25 was 1.31.  He was recently found to have low testosterone .  He was having symptoms of lack of energy and lack of muscle mass.  He also reported some decreased libido. Testosterone  levels: 7/25 191 8/25 242 He was recently started on AndroGel  5 g daily.  He has used the medication for 1 day.  He previously  underwent cystoscopy 30 years ago for evaluation of microscopic hematuria.  Past Medical History:  Past Medical History:  Diagnosis Date   Abdominal aortic aneurysm (AAA), 30-34 mm diameter 06/01/2017   Nml w/ greatest dm 3 cm US  Aorta 06/2016, Abd US  05/01/2017 showed infrarenal dilatation at 3.4 cm - rec repeat US  in 3 yrs.   Allergy    pollen   Arthritis    Cataract    removed,bilateral   Cervical disc disease 12/26/2011   Coronary artery disease involving native coronary artery of native heart without angina pectoris 12/01/2016   He had coronary angiography in 2011. There was irregularity within the LAD with up to 30% narrowing. The myocardial perfusion imaging done 11/07/2015 did not demonstrate any evidence of ischemia with a low risk nuclear stress test other than EF estimated at 47%. Chest CT 01/25/2016 showed diffuse coronary artery calcifications with heavy calcifications in the LAD. Pt seen by cardiology Dr. VEAR Sharps    Diabetes mellitus    Difficult intubation 04/05/2014   Dyslipidemia    Family history of anesthesia complication    pt states took days for his father to awaken after mask was used    Fatty liver 06/01/2017   US  05/01/2017   GERD (gastroesophageal reflux disease)    has been having acid reflux since recent endoscopy    H/O hiatal hernia    repair with lap band, now has ventral hernia midline abd   Hearing loss-aides 03/26/2012   Hyperlipidemia    Hypertension    Lapband APS + hiatal hernia repair Oct 2015 04/05/2014   Morbid obesity (  HCC)    OSA (obstructive sleep apnea)    Rotator cuff tear    Seasonal allergies    Shortness of breath    pt states related to high BP meds with extended walking or climbling stairs   Sleep apnea     Past Surgical History:  Past Surgical History:  Procedure Laterality Date   BIOPSY  09/20/2022   Procedure: BIOPSY;  Surgeon: Wilhelmenia Aloha Raddle., MD;  Location: [facility];  Service: Gastroenterology;;   CARDIAC  CATHETERIZATION     approx 3 years ago    COLONOSCOPY WITH PROPOFOL  N/A 03/10/2015   Procedure: COLONOSCOPY WITH PROPOFOL ;  Surgeon: Toribio SHAUNNA Cedar, MD;  Location: [facility];  Service: Endoscopy;  Laterality: N/A;   COLONOSCOPY WITH PROPOFOL  N/A 09/20/2022   Procedure: COLONOSCOPY WITH PROPOFOL ;  Surgeon: Mansouraty, Aloha Raddle., MD;  Location: [facility];  Service: Gastroenterology;  Laterality: N/A;   colonscopy      2012   CORONARY ATHERECTOMY N/A 07/18/2021   Procedure: CORONARY ATHERECTOMY;  Surgeon: Dann Candyce RAMAN, MD;  Location: [facility] INVASIVE CV LAB;  Service: Cardiovascular;  Laterality: N/A;   CORONARY STENT INTERVENTION N/A 07/18/2021   Procedure: CORONARY STENT INTERVENTION;  Surgeon: Dann Candyce RAMAN, MD;  Location: [facility]VAS[facility] CV LAB;  Service: Cardiovascular;  Laterality: N/A;   ESOPHAGOGASTRODUODENOSCOPY (EGD) WITH PROPOFOL  N/A 03/10/2015   Procedure: ESOPHAGOGASTRODUODENOSCOPY (EGD) WITH PROPOFOL ;  Surgeon: Toribio SHAUNNA Cedar, MD;  Location: [facility];  Service: Endoscopy;  Laterality: N/A;   HERNIA REPAIR     with gastric banding   LAPAROSCOPIC GASTRIC BANDING N/A 04/05/2014   Procedure: LAPAROSCOPIC GASTRIC BANDING;  Surgeon: Donnice KATHEE Lunger, MD;  Location: [facility];  Service: General;  Laterality: N/A;   MENISCUS REPAIR Left    left knee torn meniscus   RIGHT/LEFT HEART CATH AND CORONARY ANGIOGRAPHY N/A 07/18/2021   Procedure: RIGHT/LEFT HEART CATH AND CORONARY ANGIOGRAPHY;  Surgeon: Dann Candyce RAMAN, MD;  Location: [facility] INVASIVE CV LAB;  Service: Cardiovascular;  Laterality: N/A;   VASECTOMY      Allergies:  Allergies  Allergen Reactions   Penicillins Nausea Only    Upset stomach    Family History:  Family History  Problem Relation Age of Onset   Clotting disorder Mother        lung clot   Mesothelioma Father    Hypertension Brother    Skin cancer Brother    Colon cancer Neg Hx    Colon polyps Neg Hx    Crohn's disease Neg Hx    Esophageal cancer  Neg Hx    Rectal cancer Neg Hx    Stomach cancer Neg Hx    Ulcerative colitis Neg Hx     Social History:  Social History   Tobacco Use   Smoking status: Former    Current packs/day: 0.00    Average packs/day: 4.0 packs/day for 20.0 years (80.0 ttl pk-yrs)    Types: Cigarettes    Start date: 07/19/1973    Quit date: 12/13/1991    Years since quitting: 32.2    Passive exposure: Never   Smokeless tobacco: Never  Vaping Use   Vaping status: Never Used  Substance Use Topics   Alcohol use: Yes    Alcohol/week: 8.0 standard drinks of alcohol    Types: 8 Glasses of wine per week    Comment: 2 glasses of wine a day   Drug use: Yes    Types: Marijuana    Review of symptoms:  Constitutional:  Negative for  unexplained weight loss, night sweats, fever, chills ENT:  Negative for nose bleeds, sinus pain, painful swallowing CV:  Negative for chest pain, shortness of breath, exercise intolerance, palpitations, loss of consciousness Resp:  Negative for cough, wheezing, shortness of breath GI:  Negative for nausea, vomiting, diarrhea, bloody stools GU:  Positives noted in HPI; otherwise negative for gross hematuria, dysuria, urinary incontinence Neuro:  Negative for seizures, poor balance, limb weakness, slurred speech Psych:  Negative for lack of energy, depression, anxiety Endocrine:  Negative for polydipsia, polyuria, symptoms of hypoglycemia (dizziness, hunger, sweating) Hematologic:  Negative for anemia, purpura, petechia, prolonged or excessive bleeding, use of anticoagulants  Allergic:  Negative for difficulty breathing or choking as a result of exposure to anything; no shellfish allergy; no allergic response (rash/itch) to materials, foods  Physical exam: BP 115/80   Pulse 83   Ht 6' (1.829 m)   Wt 275 lb (124.7 kg)   BMI 37.30 kg/m  GENERAL APPEARANCE:  Well appearing, well developed, well nourished, NAD HEENT: Atraumatic, Normocephalic, oropharynx clear. NECK: Supple without  lymphadenopathy or thyromegaly. LUNGS: Clear to auscultation bilaterally. HEART: Regular Rate and Rhythm without murmurs, gallops, or rubs. ABDOMEN: Soft, non-tender, No Masses. EXTREMITIES: Moves all extremities well.  Without clubbing, cyanosis, or edema. NEUROLOGIC:  Alert and oriented x 3, normal gait, CN II-XII grossly intact.  MENTAL STATUS:  Appropriate. BACK:  Non-tender to palpation.  No CVAT SKIN:  Warm, dry and intact.   GU: Penis:  uncircumcised Meatus: Normal Scrotum: normal, no masses Testis: normal without masses bilateral Prostate: 50 g, NT, no nodules Rectum: Normal tone,  no masses or tenderness   Results: U/A: 0-5 WBC, 0-2 RBC  PVR = 431 ml

## 2024-03-12 ENCOUNTER — Ambulatory Visit (INDEPENDENT_AMBULATORY_CARE_PROVIDER_SITE_OTHER)

## 2024-03-12 ENCOUNTER — Ambulatory Visit: Admitting: Podiatry

## 2024-03-12 VITALS — Ht 72.0 in | Wt 275.0 lb

## 2024-03-12 DIAGNOSIS — M79674 Pain in right toe(s): Secondary | ICD-10-CM | POA: Diagnosis not present

## 2024-03-12 DIAGNOSIS — E1149 Type 2 diabetes mellitus with other diabetic neurological complication: Secondary | ICD-10-CM

## 2024-03-12 DIAGNOSIS — L97512 Non-pressure chronic ulcer of other part of right foot with fat layer exposed: Secondary | ICD-10-CM | POA: Diagnosis not present

## 2024-03-12 DIAGNOSIS — B351 Tinea unguium: Secondary | ICD-10-CM | POA: Diagnosis not present

## 2024-03-12 DIAGNOSIS — L84 Corns and callosities: Secondary | ICD-10-CM

## 2024-03-12 DIAGNOSIS — M79675 Pain in left toe(s): Secondary | ICD-10-CM | POA: Diagnosis not present

## 2024-03-12 MED ORDER — DOXYCYCLINE HYCLATE 100 MG PO TABS: 100.0000 mg | ORAL_TABLET | Freq: Two times a day (BID) | ORAL | 0 refills | Status: DC

## 2024-03-12 MED ORDER — SILVER SULFADIAZINE 1 % EX CREA: 1.0000 | TOPICAL_CREAM | Freq: Every day | CUTANEOUS | 0 refills | Status: DC

## 2024-03-12 NOTE — Patient Instructions (Signed)
 Monitor for any signs/symptoms of infection. Call the office immediately if any occur or go directly to the emergency room. Call with any questions/concerns.

## 2024-03-15 NOTE — Progress Notes (Signed)
 Subjective: Chief Complaint  Patient presents with   Callouses    Rm 13 Patient is here for pre-ulcerative calllus.    68 year old male presents today for thick, elongated nails he is not able to trim himself as well as for calluses to both of his big toes.  He is concerned about his right big toe as he has noticed some blood in the callus.  Denies any drainage or pus or any swelling or redness otherwise.  No fevers or chills.     Objective: AAO x3, NAD-wife is present DP/PT pulses palpable bilaterally, CRT less than 3 seconds Nails are hypertrophic, dystrophic, brittle, discolored, elongated 10. No surrounding redness or drainage. Tenderness nails 1-5 bilaterally.  Preulcerative hyperkeratotic lesion noted bilateral hallux right side worse than left.  On the left side there is no underlying ulceration.  Upon debridement of the right side there is a granular wound present measuring 0.4 x 0.4 x 0.2 cm with granulation tissue present.  There is no probing, undermining or tunneling.  There is no cellulitis noted but there is some slight edema present.  There is no fluctuation or crepitation.  There is no malodor. No pain with calf compression, swelling, warmth, erythema  Assessment: Symptomatic onychomycosis, hyperkeratotic lesions left hallux, ulceration right hallux  Plan: Symptomatic onychomycosis -Sharply debrided nails x 10 without any complications or bleeding.  Preulcerative calluses - Sharply debrided the hyperkeratotic lesion left hallux without any complications or bleeding  Right hallux ulceration - Procedure: Excisional Debridement of Wound Tool: Sharp #312 chisel blade/tissue nipper Type of Debridement: Sharp Excisional Frequency: @periodically  until appropriately healed.  Dressing is to be changed daily/keeping the wound clean and dry Rationale: Removal of non-viable soft tissue from the wound to promote healing.  Anesthesia: none Pre-Debridement Wound Measurements:  Unable to measure given callus overlying wound Post-Debridement Wound Measurements: 0.4 cm x 0.4 cm x 0.2 cm  Area devitalized tissue removed(nonviable tissue only): 0.4 cm x 0.4 cm.  Blood loss: Minimal (<50cc) Depth of Debridement: with fat layer exposed Description of tissue removed: Non-viable tissue Technique: The wound and the surrounding skin were prepped and draped in usual aseptic fashion.  Aseptic technique was maintained throughout the procedure.  Using #312 blade/tissue nipper sharp debridement of necrotic/nonviable tissue was performed until healthy bleeding wound bed was achieved.  No underlying bone or tendon was exposed during debridement.  The wound was thoroughly irrigated with normal saline solution Wound Progress:  Current Wound Volume: Debridement was performed of the chronic nonhealing diabetic foot wound on right foot Toe right, great toe on the right.  Debridement removed 0.4 cm x 0.4 cm of the necrotic tissue and subcutaneous tissue and none amount of purulent drainage was not present. Presence/absence of tissue: Necrotic tissue/nonviable tissue present at the base of the wound.  Sharp debridement was performed to remove the necrotic tissue/nonviable tissue back to viable tissue.  No devitalized/nonviable tissue present postdebridement.  Wound appeared clean and clear of infection No material in the wound was present that was identified to be inhibiting healing. Dressing: Dry, sterile, compression dressing. Disposition: Patient tolerated procedure well. Patient to return in 1 week for follow-up or as listed above. -Doxycyline.   Radiology: 3 views right foot were obtained.  There is no definitive cortical changes suggest osteomyelitis compared to prior x-rays.  No soft tissue emphysema  Return in about 3 weeks (around 04/02/2024).  Donnice JONELLE Fees DPM

## 2024-03-17 ENCOUNTER — Other Ambulatory Visit

## 2024-03-17 ENCOUNTER — Ambulatory Visit: Admitting: Podiatry

## 2024-03-17 LAB — OPHTHALMOLOGY REPORT-SCANNED

## 2024-03-30 ENCOUNTER — Other Ambulatory Visit: Payer: Self-pay

## 2024-03-30 ENCOUNTER — Other Ambulatory Visit: Payer: Self-pay | Admitting: Family Medicine

## 2024-03-30 ENCOUNTER — Encounter: Payer: Self-pay | Admitting: Internal Medicine

## 2024-03-30 MED ORDER — ROSUVASTATIN CALCIUM 40 MG PO TABS: 40.0000 mg | ORAL_TABLET | Freq: Every day | ORAL | 1 refills | Status: AC

## 2024-03-30 NOTE — Telephone Encounter (Signed)
 RX sent in

## 2024-04-03 ENCOUNTER — Ambulatory Visit: Admitting: Podiatry

## 2024-04-03 VITALS — Ht 72.0 in | Wt 275.0 lb

## 2024-04-03 DIAGNOSIS — L97511 Non-pressure chronic ulcer of other part of right foot limited to breakdown of skin: Secondary | ICD-10-CM | POA: Diagnosis not present

## 2024-04-03 NOTE — Progress Notes (Signed)
 Subjective: Chief Complaint  Patient presents with   Foot Ulcer    Rm 13 Patient is here for ulcer of the right foot. Wound is scabbed over with no drainage.    68 year old male presents today for follow-up evaluation of a wound on the right hallux.  He states that he did keep it bandaged for couple days but the area became dry so he stopped managing it.  He denies any drainage or pus or increase in swelling or redness.  He tolerated the doxycycline  well.  Does not report any fevers or chills and he has no other concerns today.  Objective: AAO x3, NAD DP/PT pulses palpable bilaterally, CRT less than 3 seconds On the plantar aspect the right foot, hallux is a hyperkeratotic lesion with dried blood present.  Upon debridement there is no definitive skin breakdown identified this time the area still preulcerative but there is no surrounding erythema, ascending size but is no drainage or pus or any fluctuation or crepitation but there is no malodor.  No pain with calf compression, swelling, warmth, erythema  Assessment: Healing ulceration right foot  Plan:  Preulcerative calluses - Sharply debrided the hyperkeratotic lesion right hallux with any complications or bleeding.  There is still preulcerative notes to recommend a small Mehta Silvadene  and a bandage daily.  He is to monitor closely for any signs or symptoms of infection and/or reoccurrence of the callus. - Monitor for any clinical signs or symptoms of infection and directed to call the office immediately should any occur or go to the ER.  Return in about 2 weeks (around 04/17/2024), or if symptoms worsen or fail to improve, for toe ulcer.  Donnice JONELLE Fees DPM

## 2024-04-11 NOTE — Progress Notes (Deleted)
 Assessment/Plan:   [name] is a very pleasant 68 y.o. year old RH male with a history of hypertension, hyperlipidemia,DM2,  seen today for evaluation of memory loss. MoCA today is /30.***.  Patient is able to participate on ADLs***.  Patient continues to drive without significant difficulties.***    Memory Impairment of unclear etiology  MRI brain without contrast to assess for underlying structural abnormality and assess vascular load  Neurocognitive testing to further evaluate cognitive concerns and determine other underlying cause of memory changes, including potential contribution from sleep, anxiety, attention, or depression  Check B12, TSH Continue to control mood as per PCP Recommend good control of cardiovascular risk factors Folllow up in  months***  Subjective:   The patient is accompanied by ***  who supplement  the history.   How long did patient have memory difficulties? For the last 6 years ***.  Reports some difficulty remembering new information, conversations and names.  Long-term memory is good. repeats oneself?  Endorsed Disoriented when walking into a room?  Patient denies except occasionally not remembering what patient came to the room for ***  Leaving objects in unusual places? Denies.   Wandering behavior?  denies .  Any personality changes?  Denies.   Any history of depression?:  Denies   Hallucinations or paranoia?  Denies   Seizures?  Denies    Any sleep changes?   Sleeps well***does not sleep well***denies vivid dreams, REM behavior or sleepwalking   Sleep apnea?  Denies   Any hygiene concerns?  Denies   Independent of bathing and dressing?  Endorsed  Does the patient needs help with medications? Patient is in charge *** Who is in charge of the finances? Patient is in charge   *** Any changes in appetite?  Denies ***   Patient have trouble swallowing? Denies.   Does the patient cook? No ***  Any kitchen accidents such as leaving the  stove on? Denies.   Any history of headaches?   Denies.   Chronic pain ? Denies.   Ambulates with difficulty?  Denies. *** Recent falls or head injuries? Denies.   Vision changes? Denies.   Any stroke like symptoms? Denies.   Any tremors?   Denies.   Any anosmia?  Denies.   Any incontinence of urine? Denies.   Any bowel dysfunction? Denies.      Patient lives with  *** History of heavy alcohol intake? Denies.   History of heavy tobacco use? Denies.   Family history of dementia? Denies.  Does patient drive? Yes ***  Pertinent available labs: July 2025 TSH 1.74,nl CBC,  ***  MRI brain, personally reviewed, remarkable for  ***  Past Medical History:  Diagnosis Date   Abdominal aortic aneurysm (AAA), 30-34 mm diameter 06/01/2017   Nml w/ greatest dm 3 cm US  Aorta 06/2016, Abd US  05/01/2017 showed infrarenal dilatation at 3.4 cm - rec repeat US  in 3 yrs.   Allergy    pollen   Arthritis    Cataract    removed,bilateral   Cervical disc disease 12/26/2011   Coronary artery disease involving native coronary artery of native heart without angina pectoris 12/01/2016   He had coronary angiography in 2011. There was irregularity within the LAD with up to 30% narrowing. The myocardial perfusion imaging done 11/07/2015 did not demonstrate any evidence of ischemia with a low risk nuclear stress test other than EF estimated at 47%. Chest CT 01/25/2016 showed diffuse coronary artery calcifications with heavy calcifications  in the LAD. Pt seen by cardiology Dr. VEAR Sharps    Diabetes mellitus    Difficult intubation 04/05/2014   Dyslipidemia    Family history of anesthesia complication    pt states took days for his father to awaken after mask was used    Fatty liver 06/01/2017   US  05/01/2017   GERD (gastroesophageal reflux disease)    has been having acid reflux since recent endoscopy    H/O hiatal hernia    repair with lap band, now has ventral hernia midline abd   Hearing loss-aides  03/26/2012   Hyperlipidemia    Hypertension    Lapband APS + hiatal hernia repair Oct 2015 04/05/2014   Morbid obesity (HCC)    OSA (obstructive sleep apnea)    Rotator cuff tear    Seasonal allergies    Shortness of breath    pt states related to high BP meds with extended walking or climbling stairs   Sleep apnea      Past Surgical History:  Procedure Laterality Date   BIOPSY  09/20/2022   Procedure: BIOPSY;  Surgeon: Wilhelmenia Aloha Raddle., MD;  Location: [facility];  Service: Gastroenterology;;   CARDIAC CATHETERIZATION     approx 3 years ago    COLONOSCOPY WITH PROPOFOL  N/A 03/10/2015   Procedure: COLONOSCOPY WITH PROPOFOL ;  Surgeon: Toribio SHAUNNA Cedar, MD;  Location: [facility];  Service: Endoscopy;  Laterality: N/A;   COLONOSCOPY WITH PROPOFOL  N/A 09/20/2022   Procedure: COLONOSCOPY WITH PROPOFOL ;  Surgeon: Wilhelmenia Aloha Raddle., MD;  Location: [facility];  Service: Gastroenterology;  Laterality: N/A;   colonscopy      2012   CORONARY ATHERECTOMY N/A 07/18/2021   Procedure: CORONARY ATHERECTOMY;  Surgeon: Dann Candyce RAMAN, MD;  Location: [facility] INVASIVE CV LAB;  Service: Cardiovascular;  Laterality: N/A;   CORONARY STENT INTERVENTION N/A 07/18/2021   Procedure: CORONARY STENT INTERVENTION;  Surgeon: Dann Candyce RAMAN, MD;  Location: [facility] INVASIVE CV LAB;  Service: Cardiovascular;  Laterality: N/A;   ESOPHAGOGASTRODUODENOSCOPY (EGD) WITH PROPOFOL  N/A 03/10/2015   Procedure: ESOPHAGOGASTRODUODENOSCOPY (EGD) WITH PROPOFOL ;  Surgeon: Toribio SHAUNNA Cedar, MD;  Location: [facility];  Service: Endoscopy;  Laterality: N/A;   HERNIA REPAIR     with gastric banding   LAPAROSCOPIC GASTRIC BANDING N/A 04/05/2014   Procedure: LAPAROSCOPIC GASTRIC BANDING;  Surgeon: Donnice KATHEE Lunger, MD;  Location: [facility];  Service: General;  Laterality: N/A;   MENISCUS REPAIR Left    left knee torn meniscus   RIGHT/LEFT HEART CATH AND CORONARY ANGIOGRAPHY N/A 07/18/2021   Procedure: RIGHT/LEFT HEART CATH  AND CORONARY ANGIOGRAPHY;  Surgeon: Dann Candyce RAMAN, MD;  Location: [facility] INVASIVE CV LAB;  Service: Cardiovascular;  Laterality: N/A;   VASECTOMY       Allergies  Allergen Reactions   Penicillins Nausea Only    Upset stomach    Current Outpatient Medications  Medication Instructions   alfuzosin  (UROXATRAL ) 10 mg, Oral, Daily   aspirin  EC 81 mg, Daily   diphenhydrAMINE  (BENADRYL ) 25 mg, Every 6 hours PRN   doxycycline  (VIBRA -TABS) 100 mg, Oral, 2 times daily   empagliflozin  (JARDIANCE ) 25 mg, Oral, Daily   fluticasone  (FLONASE ) 50 MCG/ACT nasal spray 1 spray, Daily PRN   glucose blood (TRUETEST TEST) test strip Use as instructed   insulin  glargine-yfgn (SEMGLEE ) 140 Units, Subcutaneous, Daily   Insulin  Pen Needle 29G X MISC 1 each, Does not apply, Daily   lisinopril  (ZESTRIL ) 10 mg, Oral, Daily   loratadine (CLARITIN) 10 mg, Daily  MOUNJARO  10 MG/0.5ML Pen Inject 0.5ml (10 mg) into the skin once a week.   NON FORMULARY Pt use a c-pap night   Polyethyl Glycol-Propyl Glycol (SYSTANE OP) 1 drop, As needed   potassium chloride  SA (KLOR-CON  M) 20 MEQ tablet 60 mEq, Oral, Daily   rosuvastatin  (CRESTOR ) 40 mg, Oral, Daily   silver  sulfADIAZINE  (SILVADENE ) 1 % cream 1 Application, Topical, Daily   silver  sulfADIAZINE  (SSD) 1 % cream 1 Application, Daily   tadalafil  (CIALIS ) 5 mg, Oral, Daily   testosterone  (ANDROGEL ) 5 g, Transdermal, Daily   torsemide  (DEMADEX ) 40 mg, Oral, Daily   Vitamin D  2,000 Units, Daily     VITALS:  There were no vitals filed for this visit.        No data to display              No data to display           PHYSICAL EXAM   HEENT:  Normocephalic, atraumatic. The superficial temporal arteries are without ropiness or tenderness. Cardiovascular: Regular rate and rhythm. Lungs: Clear to auscultation bilaterally. Neck: There are no carotid bruits noted bilaterally.  Orientation:  Alert and oriented to person, place and time. No aphasia or  dysarthria. Fund of knowledge is appropriate. Recent memory impaired and remote memory intact.  Attention and concentration are normal.  Able to name objects and repeat phrases. Delayed recall  /5 Cranial nerves: There is good facial symmetry. Extraocular muscles are intact and visual fields are full to confrontational testing. Speech is fluent and clear. No tongue deviation. Hearing is intact to conversational tone. Tone: Tone is good throughout. Abnormal movements: No tremors. No Asterixis. No Fasciculations Sensation: Sensation is intact to light touch. Vibration is intact at the bilateral big toe.  Coordination: The patient has no difficulty with RAM's or FNF bilaterally. Normal finger to nose  Motor: Strength is 5/5 in the bilateral upper and lower extremities. There is no pronator drift. There are no fasciculations noted. DTR's: Deep tendon reflexes are 2/4 bilaterally. Gait and Station: The patient is able to ambulate without difficulty The patient is able to heel toe walk. Gait is cautious and narrow. The patient is able to ambulate in a tandem fashion.       Thank you for allowing us  the opportunity to participate in the care of this nice patient. Please do not hesitate to contact us  for any questions or concerns.   Total time spent on today's visit was *** minutes dedicated to this patient today, preparing to see patient, examining the patient, ordering tests and/or medications and counseling the patient, documenting clinical information in the EHR or other health record, independently interpreting results and communicating results to the patient/family, discussing treatment and goals, answering patient's questions and coordinating care.  Cc:  Kennyth Worth HERO, MD  Camie Sevin 04/11/2024 3:49 PM

## 2024-04-13 ENCOUNTER — Ambulatory Visit: Admitting: Urology

## 2024-04-13 ENCOUNTER — Encounter: Payer: Self-pay | Admitting: Urology

## 2024-04-13 VITALS — BP 108/72 | HR 77 | Ht 72.0 in | Wt 270.0 lb

## 2024-04-13 DIAGNOSIS — N401 Enlarged prostate with lower urinary tract symptoms: Secondary | ICD-10-CM | POA: Diagnosis not present

## 2024-04-13 DIAGNOSIS — E291 Testicular hypofunction: Secondary | ICD-10-CM

## 2024-04-13 DIAGNOSIS — N138 Other obstructive and reflux uropathy: Secondary | ICD-10-CM | POA: Diagnosis not present

## 2024-04-13 LAB — URINALYSIS, ROUTINE W REFLEX MICROSCOPIC
Bilirubin, UA: NEGATIVE
Ketones, UA: NEGATIVE
Leukocytes,UA: NEGATIVE
Nitrite, UA: NEGATIVE
Protein,UA: NEGATIVE
RBC, UA: NEGATIVE
Specific Gravity, UA: 1.01 (ref 1.005–1.030)
Urobilinogen, Ur: 0.2 mg/dL (ref 0.2–1.0)
pH, UA: 6 (ref 5.0–7.5)

## 2024-04-13 LAB — MICROSCOPIC EXAMINATION
Bacteria, UA: NONE SEEN
Epithelial Cells (non renal): NONE SEEN /HPF (ref 0–10)

## 2024-04-13 LAB — BLADDER SCAN AMB NON-IMAGING

## 2024-04-13 NOTE — Progress Notes (Signed)
 Assessment: 1. BPH with obstruction/lower urinary tract symptoms   2. Hypogonadism in male     Plan: Continue alfuzosin  10 mg daily.   Continue AndroGel  5 g daily.  Recommend monitoring symptoms and repeating testosterone  level after he has used AndroGel  for 4-6 weeks. Return to office in 1 month for testosterone  level. Return to office in 2 months  Chief Complaint:  Chief Complaint  Patient presents with   Benign Prostatic Hypertrophy    History of Present Illness:  [name] is a 68 y.o. male who is seen for further evaluation of BPH with LUTS. At his initial visit in September 2025, he reported a several year history of lower urinary tract symptoms.  His symptoms included frequency, nocturia x 2, sensation of incomplete emptying, intermittent stream, and weak stream.  No dysuria or gross hematuria. IPSS = 17/3. He previously was on tamsulosin  but did not see improvement in his symptoms.  He reported stopping this approximately 6 months ago.  He did not see a change in his symptoms with discontinuation of the medication. He was seen by Dr. Shane about [facility] in January 2025. PVR = 255 mL Further evaluation with cystoscopy and prostate ultrasound recommended.  PSA levels have been normal. His last PSA from 5/25 was 1.31.  He was recently found to have low testosterone .  He was having symptoms of lack of energy and lack of muscle mass.  He also reported some decreased libido. Testosterone  levels: 7/25 191 8/25 242 He was recently started on AndroGel  5 g daily.    He previously underwent cystoscopy 30 years ago for evaluation of microscopic hematuria.  PVR from 9/25 =  431 ml  He returns today for follow-up.  He continues on alfuzosin  10 mg daily. He has noted improvement in his urinary symptoms with alfuzosin .  He feels like his stream is improved and he is emptying his bladder more completely.  No side effects.  No dysuria or gross hematuria. IPSS =  13/3. He started using the AndroGel  approximately 2 weeks ago.  He has not seen a change in his symptoms thus far.  Portions of the above documentation were copied from a prior visit for review purposes only.   Past Medical History:  Past Medical History:  Diagnosis Date   Abdominal aortic aneurysm (AAA), 30-34 mm diameter 06/01/2017   Nml w/ greatest dm 3 cm US  Aorta 06/2016, Abd US  05/01/2017 showed infrarenal dilatation at 3.4 cm - rec repeat US  in 3 yrs.   Allergy    pollen   Arthritis    Cataract    removed,bilateral   Cervical disc disease 12/26/2011   Coronary artery disease involving native coronary artery of native heart without angina pectoris 12/01/2016   He had coronary angiography in 2011. There was irregularity within the LAD with up to 30% narrowing. The myocardial perfusion imaging done 11/07/2015 did not demonstrate any evidence of ischemia with a low risk nuclear stress test other than EF estimated at 47%. Chest CT 01/25/2016 showed diffuse coronary artery calcifications with heavy calcifications in the LAD. Pt seen by cardiology Dr. VEAR Sharps    Diabetes mellitus    Difficult intubation 04/05/2014   Dyslipidemia    Family history of anesthesia complication    pt states took days for his father to awaken after mask was used    Fatty liver 06/01/2017   US  05/01/2017   GERD (gastroesophageal reflux disease)    has been having acid reflux since recent  endoscopy    H/O hiatal hernia    repair with lap band, now has ventral hernia midline abd   Hearing loss-aides 03/26/2012   Hyperlipidemia    Hypertension    Lapband APS + hiatal hernia repair Oct 2015 04/05/2014   Morbid obesity (HCC)    OSA (obstructive sleep apnea)    Rotator cuff tear    Seasonal allergies    Shortness of breath    pt states related to high BP meds with extended walking or climbling stairs   Sleep apnea     Past Surgical History:  Past Surgical History:  Procedure Laterality Date   BIOPSY   09/20/2022   Procedure: BIOPSY;  Surgeon: Wilhelmenia Aloha Raddle., MD;  Location: [facility];  Service: Gastroenterology;;   CARDIAC CATHETERIZATION     approx 3 years ago    COLONOSCOPY WITH PROPOFOL  N/A 03/10/2015   Procedure: COLONOSCOPY WITH PROPOFOL ;  Surgeon: Toribio SHAUNNA Cedar, MD;  Location: [facility];  Service: Endoscopy;  Laterality: N/A;   COLONOSCOPY WITH PROPOFOL  N/A 09/20/2022   Procedure: COLONOSCOPY WITH PROPOFOL ;  Surgeon: Mansouraty, Aloha Raddle., MD;  Location: [facility];  Service: Gastroenterology;  Laterality: N/A;   colonscopy      2012   CORONARY ATHERECTOMY N/A 07/18/2021   Procedure: CORONARY ATHERECTOMY;  Surgeon: Dann Candyce RAMAN, MD;  Location: [facility] INVASIVE CV LAB;  Service: Cardiovascular;  Laterality: N/A;   CORONARY STENT INTERVENTION N/A 07/18/2021   Procedure: CORONARY STENT INTERVENTION;  Surgeon: Dann Candyce RAMAN, MD;  Location: [facility] INVASIVE CV LAB;  Service: Cardiovascular;  Laterality: N/A;   ESOPHAGOGASTRODUODENOSCOPY (EGD) WITH PROPOFOL  N/A 03/10/2015   Procedure: ESOPHAGOGASTRODUODENOSCOPY (EGD) WITH PROPOFOL ;  Surgeon: Toribio SHAUNNA Cedar, MD;  Location: [facility];  Service: Endoscopy;  Laterality: N/A;   HERNIA REPAIR     with gastric banding   LAPAROSCOPIC GASTRIC BANDING N/A 04/05/2014   Procedure: LAPAROSCOPIC GASTRIC BANDING;  Surgeon: Donnice KATHEE Lunger, MD;  Location: [facility];  Service: General;  Laterality: N/A;   MENISCUS REPAIR Left    left knee torn meniscus   RIGHT/LEFT HEART CATH AND CORONARY ANGIOGRAPHY N/A 07/18/2021   Procedure: RIGHT/LEFT HEART CATH AND CORONARY ANGIOGRAPHY;  Surgeon: Dann Candyce RAMAN, MD;  Location: [facility] INVASIVE CV LAB;  Service: Cardiovascular;  Laterality: N/A;   VASECTOMY      Allergies:  Allergies  Allergen Reactions   Penicillins Nausea Only    Upset stomach    Family History:  Family History  Problem Relation Age of Onset   Clotting disorder Mother        lung clot   Mesothelioma Father     Hypertension Brother    Skin cancer Brother    Colon cancer Neg Hx    Colon polyps Neg Hx    Crohn's disease Neg Hx    Esophageal cancer Neg Hx    Rectal cancer Neg Hx    Stomach cancer Neg Hx    Ulcerative colitis Neg Hx     Social History:  Social History   Tobacco Use   Smoking status: Former    Current packs/day: 0.00    Average packs/day: 4.0 packs/day for 20.0 years (80.0 ttl pk-yrs)    Types: Cigarettes    Start date: 07/19/1973    Quit date: 12/13/1991    Years since quitting: 32.3    Passive exposure: Never   Smokeless tobacco: Never  Vaping Use   Vaping status: Never Used  Substance Use Topics   Alcohol use: Yes  Alcohol/week: 8.0 standard drinks of alcohol    Types: 8 Glasses of wine per week    Comment: 2 glasses of wine a day   Drug use: Yes    Types: Marijuana    ROS: Constitutional:  Negative for fever, chills, weight loss CV: Negative for chest pain, previous MI, hypertension Respiratory:  Negative for shortness of breath, wheezing, sleep apnea, frequent cough GI:  Negative for nausea, vomiting, bloody stool, GERD  Physical exam: BP 108/72   Pulse 77   Ht 6' (1.829 m)   Wt 270 lb (122.5 kg)   BMI 36.62 kg/m  GENERAL APPEARANCE:  Well appearing, well developed, well nourished, NAD HEENT:  Atraumatic, normocephalic, oropharynx clear NECK:  Supple without lymphadenopathy or thyromegaly ABDOMEN:  Soft, non-tender, no masses EXTREMITIES:  Moves all extremities well, without clubbing, cyanosis, or edema NEUROLOGIC:  Alert and oriented x 3, normal gait, CN II-XII grossly intact MENTAL STATUS:  appropriate BACK:  Non-tender to palpation, No CVAT SKIN:  Warm, dry, and intact   Results: U/A: 0-5 WBCs, 0-2 RBCs  PVR = 138 ml

## 2024-04-14 ENCOUNTER — Ambulatory Visit: Admitting: Family Medicine

## 2024-04-15 ENCOUNTER — Ambulatory Visit: Admitting: Physician Assistant

## 2024-04-15 ENCOUNTER — Ambulatory Visit

## 2024-04-19 ENCOUNTER — Encounter: Payer: Self-pay | Admitting: Podiatry

## 2024-04-20 ENCOUNTER — Other Ambulatory Visit: Payer: Self-pay | Admitting: Podiatry

## 2024-04-20 MED ORDER — DOXYCYCLINE HYCLATE 100 MG PO TABS: 100.0000 mg | ORAL_TABLET | Freq: Two times a day (BID) | ORAL | 0 refills | Status: DC

## 2024-04-27 ENCOUNTER — Encounter: Payer: Self-pay | Admitting: Podiatry

## 2024-04-28 ENCOUNTER — Other Ambulatory Visit: Payer: Self-pay | Admitting: Podiatry

## 2024-04-28 MED ORDER — CIPROFLOXACIN HCL 500 MG PO TABS: 500.0000 mg | ORAL_TABLET | Freq: Two times a day (BID) | ORAL | 0 refills | Status: DC

## 2024-05-04 NOTE — Telephone Encounter (Signed)
 Can we add him on for Tuesday?

## 2024-05-05 ENCOUNTER — Ambulatory Visit (INDEPENDENT_AMBULATORY_CARE_PROVIDER_SITE_OTHER): Admitting: Podiatry

## 2024-05-05 ENCOUNTER — Ambulatory Visit (INDEPENDENT_AMBULATORY_CARE_PROVIDER_SITE_OTHER)

## 2024-05-05 DIAGNOSIS — L97522 Non-pressure chronic ulcer of other part of left foot with fat layer exposed: Secondary | ICD-10-CM

## 2024-05-05 DIAGNOSIS — L97511 Non-pressure chronic ulcer of other part of right foot limited to breakdown of skin: Secondary | ICD-10-CM | POA: Diagnosis not present

## 2024-05-05 MED ORDER — AMOXICILLIN-POT CLAVULANATE 875-125 MG PO TABS: 1.0000 | ORAL_TABLET | Freq: Two times a day (BID) | ORAL | 0 refills | Status: DC

## 2024-05-11 NOTE — Progress Notes (Signed)
 Subjective: Chief Complaint  Patient presents with   Diabetic Ulcer    Rm11 Bilateral ulceration great toes.    68 year old male presents today for follow-up evaluation of a wound on the right hallux.  He states that on both big toes the calluses gotten thick and has had some swelling or redness.  He has been on doxycycline  as well as Cipro .  The Cipro  did seem to help some.  Not to any drainage or pus.  No red streaks.  Denies any fevers or chills.  No other concerns today.  Objective: AAO x3, NAD DP/PT pulses palpable bilaterally, CRT less than 3 seconds On the plantar aspect the right and left foot along the hallux is a hyperkeratotic lesion with dried blood present.  Upon debridement there are superficial area of skin breakdown noted on the right foot.  There is no probing, undermining or tunneling.  There is localized edema and erythema.  There is no ascending cellulitis but there is no fluctuation or crepitation.  There is no malodor. No pain with calf compression, swelling, warmth, erythema  Assessment: Bilateral hallux ulceration  Plan:  -X-rays were obtained reviewed.  Multiple views obtained.  There is no evidence of cortical changes suggest osteomyelitis at this time.  There is no soft tissue edema. -Sharp debrided hyperkeratotic lesions.  After debridement there was superficial granular wound noted.  There is no probing, undermining or tunneling.  There is granulation tissue noted on the wound bed.  Continue with continue daily dressing changes with Silvadene  and offloading.  Prescribed Augmentin .  Return in about 2 weeks (around 05/19/2024).  Donnice JONELLE Fees DPM

## 2024-05-12 ENCOUNTER — Ambulatory Visit (INDEPENDENT_AMBULATORY_CARE_PROVIDER_SITE_OTHER): Admitting: Podiatry

## 2024-05-12 ENCOUNTER — Encounter: Payer: Self-pay | Admitting: Podiatry

## 2024-05-12 DIAGNOSIS — L97511 Non-pressure chronic ulcer of other part of right foot limited to breakdown of skin: Secondary | ICD-10-CM | POA: Diagnosis not present

## 2024-05-12 DIAGNOSIS — L97522 Non-pressure chronic ulcer of other part of left foot with fat layer exposed: Secondary | ICD-10-CM

## 2024-05-12 NOTE — Progress Notes (Unsigned)
 When she gets back from [place]

## 2024-05-17 ENCOUNTER — Encounter: Payer: Self-pay | Admitting: Family Medicine

## 2024-05-17 ENCOUNTER — Encounter: Payer: Self-pay | Admitting: Urology

## 2024-05-17 DIAGNOSIS — E1159 Type 2 diabetes mellitus with other circulatory complications: Secondary | ICD-10-CM

## 2024-05-17 DIAGNOSIS — E1165 Type 2 diabetes mellitus with hyperglycemia: Secondary | ICD-10-CM

## 2024-05-18 ENCOUNTER — Encounter

## 2024-05-18 ENCOUNTER — Other Ambulatory Visit: Payer: Self-pay | Admitting: Urology

## 2024-05-18 ENCOUNTER — Other Ambulatory Visit: Payer: Self-pay

## 2024-05-18 ENCOUNTER — Encounter: Payer: Self-pay | Admitting: Physician Assistant

## 2024-05-18 DIAGNOSIS — N138 Other obstructive and reflux uropathy: Secondary | ICD-10-CM

## 2024-05-18 MED ORDER — MOUNJARO 10 MG/0.5ML ~~LOC~~ SOAJ: 10.0000 mg | SUBCUTANEOUS | 3 refills | Status: DC

## 2024-05-18 MED ORDER — EMPAGLIFLOZIN 25 MG PO TABS: 25.0000 mg | ORAL_TABLET | Freq: Every day | ORAL | 1 refills | Status: AC

## 2024-05-18 MED ORDER — ALFUZOSIN HCL ER 10 MG PO TB24: 10.0000 mg | ORAL_TABLET | Freq: Every day | ORAL | 1 refills | Status: AC

## 2024-05-30 ENCOUNTER — Encounter: Payer: Self-pay | Admitting: Urology

## 2024-06-02 ENCOUNTER — Ambulatory Visit: Admitting: Family Medicine

## 2024-06-03 ENCOUNTER — Encounter: Payer: Self-pay | Admitting: Family Medicine

## 2024-06-04 ENCOUNTER — Other Ambulatory Visit: Payer: Self-pay | Admitting: *Deleted

## 2024-06-04 MED ORDER — INSULIN GLARGINE-YFGN 100 UNIT/ML ~~LOC~~ SOPN: 140.0000 [IU] | PEN_INJECTOR | Freq: Every day | SUBCUTANEOUS | 1 refills | Status: DC

## 2024-06-05 ENCOUNTER — Telehealth: Payer: Self-pay

## 2024-06-05 NOTE — Telephone Encounter (Signed)
 Copied from CRM #[id number]. Topic: Clinical - Prescription Issue >> Jun 05, 2024  5:22 PM China J wrote: Reason for CRM: The pharmacy is calling because they are unable to fill the request for insulin  glargine-yfgn (SEMGLEE ) 100 UNIT/ML Pen. They only have Lantus  available and are needing authorization to proceed with the switch.

## 2024-06-08 NOTE — Telephone Encounter (Signed)
 Please see msg regarding insulin  shortage and advise for patient

## 2024-06-08 NOTE — Telephone Encounter (Signed)
 Please see msg regarding pt insulin  and advise

## 2024-06-09 ENCOUNTER — Other Ambulatory Visit: Payer: Self-pay

## 2024-06-09 MED ORDER — LANTUS SOLOSTAR 100 UNIT/ML ~~LOC~~ SOPN: PEN_INJECTOR | SUBCUTANEOUS | 1 refills | Status: DC

## 2024-06-09 NOTE — Telephone Encounter (Signed)
 Alternative Rx sent to [facility] pharmacy

## 2024-06-16 ENCOUNTER — Telehealth: Payer: Self-pay

## 2024-06-16 ENCOUNTER — Other Ambulatory Visit: Payer: Self-pay

## 2024-06-16 MED ORDER — LANTUS SOLOSTAR 100 UNIT/ML ~~LOC~~ SOPN: PEN_INJECTOR | SUBCUTANEOUS | 0 refills | Status: AC

## 2024-06-16 NOTE — Telephone Encounter (Signed)
 Corrected prescription and sent new order for 15boxes.   Copied from CRM #[id number]. Topic: Clinical - Prescription Issue >> Jun 15, 2024  2:47 PM Kevelyn M wrote: Reason for CRM: Patient stated he normally gets 15 boxes of the insulin  glargine (LANTUS  SOLOSTAR) 100 UNIT/ML Solostar Pen. The prescription was written for 1 box. [facility] pharmacy needs verification.  Call back # [phone] [place])

## 2024-06-21 ENCOUNTER — Encounter: Payer: Self-pay | Admitting: Podiatry

## 2024-06-22 ENCOUNTER — Other Ambulatory Visit: Payer: Self-pay | Admitting: Podiatry

## 2024-06-22 MED ORDER — DOXYCYCLINE HYCLATE 100 MG PO TABS: 100.0000 mg | ORAL_TABLET | Freq: Two times a day (BID) | ORAL | 0 refills | Status: AC

## 2024-06-30 ENCOUNTER — Ambulatory Visit: Admitting: Urology

## 2024-07-07 ENCOUNTER — Encounter: Payer: Self-pay | Admitting: Family Medicine

## 2024-07-07 ENCOUNTER — Ambulatory Visit: Admitting: Family Medicine

## 2024-07-07 VITALS — BP 120/70 | HR 78 | Temp 97.6°F | Ht 72.0 in | Wt 277.0 lb

## 2024-07-07 DIAGNOSIS — E291 Testicular hypofunction: Secondary | ICD-10-CM

## 2024-07-07 DIAGNOSIS — E1169 Type 2 diabetes mellitus with other specified complication: Secondary | ICD-10-CM

## 2024-07-07 DIAGNOSIS — E1165 Type 2 diabetes mellitus with hyperglycemia: Secondary | ICD-10-CM

## 2024-07-07 DIAGNOSIS — G4700 Insomnia, unspecified: Secondary | ICD-10-CM | POA: Insufficient documentation

## 2024-07-07 DIAGNOSIS — G4733 Obstructive sleep apnea (adult) (pediatric): Secondary | ICD-10-CM

## 2024-07-07 DIAGNOSIS — I5030 Unspecified diastolic (congestive) heart failure: Secondary | ICD-10-CM

## 2024-07-07 DIAGNOSIS — Z23 Encounter for immunization: Secondary | ICD-10-CM

## 2024-07-07 DIAGNOSIS — R413 Other amnesia: Secondary | ICD-10-CM

## 2024-07-07 DIAGNOSIS — N400 Enlarged prostate without lower urinary tract symptoms: Secondary | ICD-10-CM

## 2024-07-07 DIAGNOSIS — J449 Chronic obstructive pulmonary disease, unspecified: Secondary | ICD-10-CM

## 2024-07-07 DIAGNOSIS — J438 Other emphysema: Secondary | ICD-10-CM

## 2024-07-07 DIAGNOSIS — R7989 Other specified abnormal findings of blood chemistry: Secondary | ICD-10-CM

## 2024-07-07 DIAGNOSIS — E1159 Type 2 diabetes mellitus with other circulatory complications: Secondary | ICD-10-CM

## 2024-07-07 DIAGNOSIS — M25511 Pain in right shoulder: Secondary | ICD-10-CM | POA: Insufficient documentation

## 2024-07-07 DIAGNOSIS — L97522 Non-pressure chronic ulcer of other part of left foot with fat layer exposed: Secondary | ICD-10-CM | POA: Insufficient documentation

## 2024-07-07 LAB — CBC
HCT: 44.8 % (ref 39.0–52.0)
Hemoglobin: 15.1 g/dL (ref 13.0–17.0)
MCHC: 33.7 g/dL (ref 30.0–36.0)
MCV: 91.8 fl (ref 78.0–100.0)
Platelets: 179 K/uL (ref 150.0–400.0)
RBC: 4.88 Mil/uL (ref 4.22–5.81)
RDW: 13.9 % (ref 11.5–15.5)
WBC: 5.9 K/uL (ref 4.0–10.5)

## 2024-07-07 LAB — COMPREHENSIVE METABOLIC PANEL WITH GFR
ALT: 33 U/L (ref 3–53)
AST: 37 U/L (ref 5–37)
Albumin: 4.4 g/dL (ref 3.5–5.2)
Alkaline Phosphatase: 72 U/L (ref 39–117)
BUN: 17 mg/dL (ref 6–23)
CO2: 32 meq/L (ref 19–32)
Calcium: 8.6 mg/dL (ref 8.4–10.5)
Chloride: 98 meq/L (ref 96–112)
Creatinine, Ser: 1.35 mg/dL (ref 0.40–1.50)
GFR: 53.63 mL/min — ABNORMAL LOW
Glucose, Bld: 113 mg/dL — ABNORMAL HIGH (ref 70–99)
Potassium: 3.6 meq/L (ref 3.5–5.1)
Sodium: 139 meq/L (ref 135–145)
Total Bilirubin: 0.5 mg/dL (ref 0.2–1.2)
Total Protein: 6.7 g/dL (ref 6.0–8.3)

## 2024-07-07 LAB — MICROALBUMIN / CREATININE URINE RATIO
Creatinine,U: 19.1 mg/dL
Microalb Creat Ratio: UNDETERMINED mg/g (ref 0.0–30.0)
Microalb, Ur: <0.7 mg/dL

## 2024-07-07 LAB — LIPID PANEL
Cholesterol: 115 mg/dL (ref 28–200)
HDL: 32.7 mg/dL — ABNORMAL LOW
LDL Cholesterol: 35 mg/dL (ref 10–99)
NonHDL: 82.44
Total CHOL/HDL Ratio: 4
Triglycerides: 239 mg/dL — ABNORMAL HIGH (ref 10.0–149.0)
VLDL: 47.8 mg/dL — ABNORMAL HIGH (ref 0.0–40.0)

## 2024-07-07 LAB — PSA: PSA: 1.23 ng/mL (ref 0.10–4.00)

## 2024-07-07 LAB — TSH: TSH: 2.04 u[IU]/mL (ref 0.35–5.50)

## 2024-07-07 LAB — HEMOGLOBIN A1C: Hgb A1c MFr Bld: 6.7 % — ABNORMAL HIGH (ref 4.6–6.5)

## 2024-07-07 LAB — TESTOSTERONE: Testosterone: 160.8 ng/dL — ABNORMAL LOW (ref 300.00–890.00)

## 2024-07-07 MED ORDER — TIRZEPATIDE 12.5 MG/0.5ML ~~LOC~~ SOAJ: 12.5000 mg | SUBCUTANEOUS | 3 refills | Status: AC

## 2024-07-07 MED ORDER — SILVER SULFADIAZINE 1 % EX CREA: 1.0000 | TOPICAL_CREAM | Freq: Every day | CUTANEOUS | 0 refills | Status: AC

## 2024-07-07 NOTE — Assessment & Plan Note (Signed)
 Continue management per pulmonology.  Overall symptoms are stable.

## 2024-07-07 NOTE — Assessment & Plan Note (Signed)
 Blood pressure at goal today.  He will continue lisinopril  10 mg daily.  He will monitor at home and let us  know if persistent elevated.

## 2024-07-07 NOTE — Progress Notes (Signed)
 "  [name] is a 69 y.o. male who presents today for an office visit.  Assessment/Plan:  Chronic Problems Addressed Today: Right shoulder pain This has been an ongoing issue since last year after falling.  Follow-up with sports medicine and physical therapy.  Still having persistent pain.  He would like to have another handout about home exercises to work however he is concerned that pain has been persistent.  Likely needs an MRI at this point though advised him to discuss this further with sports medicine.  Hypertension associated with diabetes (HCC) Blood pressure at goal today.  He will continue lisinopril  10 mg daily.  He will monitor at home and let us  know if persistent elevated.  Dyslipidemia associated with type 2 diabetes mellitus (HCC) Check lipids.  Follows with cardiology for this.  Doing well with Crestor  40 mg daily.  T2DM (type 2 diabetes mellitus) (HCC) Check A1c with labs.  He is having a few hypoglycemic episodes since our last visit and is working on weaning down on his insulin .  Currently taking [employer] 5 units insulin  glargine daily.  Will increase his Mounjaro  to 12.5 mg weekly and continue Jardiance  25 mg daily.  Recheck A1c in 3 to 6 months depending on results today.  (HFpEF) heart failure with preserved ejection fraction (HCC) Continue management per cardiology.  No signs of volume overload today.  Toe ulcer, left, with fat layer exposed (HCC) Continue management per podiatry.  Morbid (severe) obesity due to excess calories (HCC) BMI 37.57 today.  He is working on lifestyle modifications.  We are increasing dose of Mounjaro  today which should help with this as well.  OSA (obstructive sleep apnea) Continue CPAP.  Will give new DME order today though did discuss with patient that insurance may require repeat sleep study.  Paraseptal emphysema (HCC) Continue management per pulmonology.  Overall symptoms are stable.  Insomnia Overall symptoms are  manageable.  We discussed treatment options as well as sleep hygiene measures as well.  He can use over-the-counter melatonin as needed.  Also okay for him to use Benadryl  or Unisom as needed as well.  Hypogonadism in male Patient has been off of his testosterone  for the last month or so.  Did feel like it helped some with his strength though he was concerned about irritability.  He would like to restart.  Will recheck labs today.  Probably recheck again in a few weeks after restarting his testosterone  supplementation.  Memory impairment Has upcoming appointment with neurology in a few weeks.  Overall his symptoms are stable.     Subjective:  HPI:  See assessment / plan for status of chronic conditions.   Discussed the use of AI scribe software for clinical note transcription with the patient, who gave verbal consent to proceed.  History of Present Illness [name] is a 69 year old male with shoulder pain and diabetes who presents for evaluation of persistent shoulder pain and diabetes management.  He has been experiencing persistent shoulder pain since a fall in [place] approximately six months ago. Despite using resistance bands from a previous consultation, he lost the instructions for their use and continues to experience pain when raising his arm. He reports that a previous x-ray was performed and that he was told there was arthritis. He believes the issue is related to the rotator cuff. He has been trying to keep the shoulder moving but reports no improvement.  He experiences ear canal infections when using hearing aids, which he  attributes to irritation. He uses alcohol wipes and a powder from a pharmacy to manage the condition, which he applies before flights. He wants to comfortably use his hearing aids, which are expensive.  He is currently on Mounjaro  and insulin  for diabetes management. He reports a reduction in insulin  use to 75 units and has experienced low blood sugar  episodes three to four times in the past two months, which is a change from his previous stability. He also takes Jardiance  and wants to increase his Mounjaro  dosage to 12.5 mg.  He has a history of neuropathy and toe ulcers, particularly on his big toes. He is currently on doxycycline  for an ulcer on his left toe, which worsened after a recent procedure. He applies SSD cream  and bandages daily. He is concerned about further surgical interventions on his right toe, which he reports is getting better.  He has a history of sleep apnea and uses a CPAP machine, which he describes as essential for his sleep. He mentions increased nocturnal movements that disturb his partner's sleep, and a recent episode of low blood sugar at night. He is considering options to improve his sleep quality, including melatonin.  He has a history of using testosterone  cream but stopped about a month and a half ago due to travel. He reports feeling less strong and more irritable when using the cream. He plans to resume its use.  He mentions a family history of Lewy body dementia in his mother and is concerned about his cognitive health. He had an MRI in August, which showed no abnormalities.  He received a flu shot recently and is considering a pneumonia vaccine. He is open to receiving the Prevnar 20 vaccine.         Objective:  Physical Exam: BP 120/70   Pulse 78   Temp 97.6 F (36.4 C) (Temporal)   Ht 6' (1.829 m)   Wt 277 lb (125.6 kg)   SpO2 98%   BMI 37.57 kg/m   Wt Readings from Last 3 Encounters:  07/07/24 277 lb (125.6 kg)  04/13/24 270 lb (122.5 kg)  04/03/24 275 lb (124.7 kg)   Gen: No acute distress, resting comfortably CV: Regular rate and rhythm with no murmurs appreciated Pulm: Normal work of breathing, clear to auscultation bilaterally with no crackles, wheezes, or rhonchi Neuro: Grossly normal, moves all extremities Psych: Normal affect and thought content  Time Spent: 45 minutes of total  time was spent on the date of the encounter performing the following actions: chart review prior to seeing the patient, obtaining history, performing a medically necessary exam, counseling on the treatment plan, placing orders, and documenting in our EHR.        Worth HERO. Kennyth, MD 07/07/2024 11:39 AM  "

## 2024-07-07 NOTE — Assessment & Plan Note (Signed)
 Patient has been off of his testosterone  for the last month or so.  Did feel like it helped some with his strength though he was concerned about irritability.  He would like to restart.  Will recheck labs today.  Probably recheck again in a few weeks after restarting his testosterone  supplementation.

## 2024-07-07 NOTE — Assessment & Plan Note (Signed)
 Check A1c with labs.  He is having a few hypoglycemic episodes since our last visit and is working on weaning down on his insulin .  Currently taking [employer] 5 units insulin  glargine daily.  Will increase his Mounjaro  to 12.5 mg weekly and continue Jardiance  25 mg daily.  Recheck A1c in 3 to 6 months depending on results today.

## 2024-07-07 NOTE — Assessment & Plan Note (Signed)
 This has been an ongoing issue since last year after falling.  Follow-up with sports medicine and physical therapy.  Still having persistent pain.  He would like to have another handout about home exercises to work however he is concerned that pain has been persistent.  Likely needs an MRI at this point though advised him to discuss this further with sports medicine.

## 2024-07-07 NOTE — Assessment & Plan Note (Signed)
Continue management per cardiology.  No signs of volume overload today. 

## 2024-07-07 NOTE — Assessment & Plan Note (Signed)
 Continue CPAP.  Will give new DME order today though did discuss with patient that insurance may require repeat sleep study.

## 2024-07-07 NOTE — Assessment & Plan Note (Signed)
 Check lipids.  Follows with cardiology for this.  Doing well with Crestor  40 mg daily.

## 2024-07-07 NOTE — Assessment & Plan Note (Signed)
 Continue management per podiatry

## 2024-07-07 NOTE — Assessment & Plan Note (Signed)
 Has upcoming appointment with neurology in a few weeks.  Overall his symptoms are stable.

## 2024-07-07 NOTE — Assessment & Plan Note (Signed)
 BMI 37.57 today.  He is working on lifestyle modifications.  We are increasing dose of Mounjaro  today which should help with this as well.

## 2024-07-07 NOTE — Assessment & Plan Note (Signed)
 Overall symptoms are manageable.  We discussed treatment options as well as sleep hygiene measures as well.  He can use over-the-counter melatonin as needed.  Also okay for him to use Benadryl  or Unisom as needed as well.

## 2024-07-07 NOTE — Patient Instructions (Signed)
 It was very nice to see you today!  VISIT SUMMARY: During your visit, we discussed your persistent shoulder pain, diabetes management, foot ulcers, testosterone  therapy, sleep apnea, insomnia, and dyslipidemia. We reviewed your current treatments and made adjustments to better manage your conditions.  Right PLAN: LEFT SHOULDER ROTATOR CUFF TEAR AND OSTEOARTHRITIS: You have chronic right shoulder pain from a rotator cuff tear and osteoarthritis. -You received a handout with shoulder exercises and stretches. -Please contact sports medicine for MRI approval.  TYPE 2 DIABETES MELLITUS WITH HYPERGLYCEMIA: Your diabetes is managed with insulin  glargine, Mounjaro , and Jardiance . You have experienced recent low blood sugar episodes. -We increased your Mounjaro  dosage to 12.5 mg weekly to reduce insulin  dependency. -Blood work, including A1c, PSA, cholesterol, blood counts, kidney function, and testosterone  levels, was ordered.  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 Your left toe worsened post-trimming, and your right toe is improving. -Continue doxycycline  for infection and daily SSD cream  with bandaging. -We will discuss the trimming approach with your podiatrist.  MALE HYPOGONADISM ON TESTOSTERONE  THERAPY: You have male hypogonadism and were on Androgel , which you recently discontinued due to irritability and mood changes. -Resume testosterone  therapy and monitor testosterone  levels. -A testosterone  level check was ordered.  MORBID OBESITY DUE TO EXCESS CALORIES: Your weight is managed with Mounjaro  and insulin  reduction. Weight loss is contributing to hypoglycemic episodes. -We increased your Mounjaro  dosage to 12.5 mg weekly.  OBSTRUCTIVE SLEEP APNEA: You have obstructive sleep apnea managed with CPAP. Your CPAP machine is nearing the end of its lifespan. -An order for a new CPAP machine was provided. -We  discussed the potential need for an updated sleep study.  INSOMNIA: You have difficulty maintaining sleep and previously used melatonin. -Try melatonin 2-3 mg at bedtime. -Consider over-the-counter sleep aids like Zyquil or Unisom.  DYSLIPIDEMIA ASSOCIATED WITH TYPE 2 DIABETES MELLITUS: You have dyslipidemia associated with type 2 diabetes. -Blood work, including cholesterol levels, was ordered to assess the current status.  Return in about 6 months (around 01/04/2025) for Follow Up.   Take care, Dr Kennyth  PLEASE NOTE:  If you had any lab tests, please let us  know if you have not heard back within a few days. You may see your results on mychart before we have a chance to review them but we will give you a call once they are reviewed by us .   If we ordered any referrals today, please let us  know if you have not heard from their office within the next week.   If you had any urgent prescriptions sent in today, please check with the pharmacy within an hour of our visit to make sure the prescription was transmitted appropriately.   Please try these tips to maintain a healthy lifestyle:  Eat at least 3 REAL meals and 1-2 snacks per day.  Aim for no more than 5 hours between eating.  If you eat breakfast, please do so within one hour of getting up.   Each meal should contain half fruits/vegetables, one quarter protein, and one quarter carbs (no bigger than a computer mouse)  Cut down on sweet beverages. This includes juice, soda, and sweet tea.   Drink at least 1 glass of water with each meal and aim for at least 8 glasses per day  Exercise at least 150 minutes every week.

## 2024-07-09 ENCOUNTER — Ambulatory Visit: Admitting: Urology

## 2024-07-09 ENCOUNTER — Encounter: Payer: Self-pay | Admitting: Urology

## 2024-07-09 ENCOUNTER — Ambulatory Visit: Admitting: Podiatry

## 2024-07-09 VITALS — BP 96/64 | HR 92 | Ht 72.0 in | Wt 272.0 lb

## 2024-07-09 DIAGNOSIS — E291 Testicular hypofunction: Secondary | ICD-10-CM

## 2024-07-09 DIAGNOSIS — B351 Tinea unguium: Secondary | ICD-10-CM

## 2024-07-09 DIAGNOSIS — M79674 Pain in right toe(s): Secondary | ICD-10-CM

## 2024-07-09 DIAGNOSIS — L97522 Non-pressure chronic ulcer of other part of left foot with fat layer exposed: Secondary | ICD-10-CM

## 2024-07-09 DIAGNOSIS — L97511 Non-pressure chronic ulcer of other part of right foot limited to breakdown of skin: Secondary | ICD-10-CM

## 2024-07-09 DIAGNOSIS — L84 Corns and callosities: Secondary | ICD-10-CM

## 2024-07-09 DIAGNOSIS — N138 Other obstructive and reflux uropathy: Secondary | ICD-10-CM

## 2024-07-09 DIAGNOSIS — R350 Frequency of micturition: Secondary | ICD-10-CM

## 2024-07-09 DIAGNOSIS — N401 Enlarged prostate with lower urinary tract symptoms: Secondary | ICD-10-CM | POA: Diagnosis not present

## 2024-07-09 DIAGNOSIS — E1149 Type 2 diabetes mellitus with other diabetic neurological complication: Secondary | ICD-10-CM

## 2024-07-09 LAB — URINALYSIS, ROUTINE W REFLEX MICROSCOPIC
Bilirubin, UA: NEGATIVE
Ketones, UA: NEGATIVE
Leukocytes,UA: NEGATIVE
Nitrite, UA: NEGATIVE
Protein,UA: NEGATIVE
RBC, UA: NEGATIVE
Specific Gravity, UA: 1.01 (ref 1.005–1.030)
Urobilinogen, Ur: 0.2 mg/dL (ref 0.2–1.0)
pH, UA: 6 (ref 5.0–7.5)

## 2024-07-09 LAB — MICROSCOPIC EXAMINATION: Bacteria, UA: NONE SEEN

## 2024-07-09 MED ORDER — TLANDO 112.5 MG PO CAPS: 2.0000 | ORAL_CAPSULE | Freq: Two times a day (BID) | ORAL | 5 refills | Status: AC

## 2024-07-09 NOTE — Patient Instructions (Signed)
 Monitor for any signs/symptoms of infection. Call the office immediately if any occur or go directly to the emergency room. Call with any questions/concerns.

## 2024-07-09 NOTE — Progress Notes (Signed)
 Subjective: Chief Complaint  Patient presents with   Foot Ulcer    Pt is here to f/u on bilateral feet due to ulcers, states the left toe has been draining/ bleeding since his last visit.     69 year old male presents today for follow-up evaluation of a wound on the right hallux.  He states this has been doing well however the left side has been doing worse and has had an opening still present.  He has not had any drainage or pus.  No increase in swelling or redness.  Does not report any fevers or chills.  He also states his knees and nails trimmed as they are thick elongated he cannot do the himself.  Objective: AAO x3, NAD DP/PT pulses palpable bilaterally, CRT less than 3 seconds Nails are hypertrophic, dystrophic, brittle, discolored, elongated 10. No surrounding redness or drainage. Tenderness nails 1-5 bilaterally.  On the right hallux there is a hyperkeratotic lesion but upon debridement there is no underlying ulceration, drainage or signs of infection.  The left side there is a hyperkeratotic lesion with central granular wound.  Prior to 1 debridement the wound was smaller measuring 0.3 x 0.3 x 0.2 cm however after debridement the wound was larger measuring 0.6 x 0.5 x 0.3 cm.  There is no probing to bone, undermining or tunneling.  There is no surrounding erythema, ascending cellulitis.  No fluctuation or crepitation but there is no malodor. No pain with calf compression, swelling, warmth, erythema  Assessment: Preulcerative lesion right hallux; left hallux ulceration; symptomatic onychomycosis  Plan:  Symptomatic onychomycosis: Nails sharply debrided x 10 without complications or bleeding  Right hallux: Sharply debrided the hyperkeratotic lesion any complications or bleeding.  Continue moisturizer, offloading.  Left hallux: Wound was sharply debrided as noted below.  Recommended to continue with daily dressing changes with small Mehta Silvadene  followed by bandage.  Offloading.  Monitor for any clinical signs or symptoms of infection and directed to call the office immediately should any occur or go to the ER.  Excisional Debridement Note   Indication/Diagnosis: Ulcer left toe, fat layer exposed Frequency: Periodically Response to prior treatment: improved granulation  Pre-Procedure Wound Assessment: Wound Location: Left hallux Size (cm): L 0.3 x W 0.3 x D 0.2 Infection: absent Necrotic/Devitalized Tissue:present- 75% Healing Barriers: Nonviable, devitalized hyperkeratotic tissue   Procedure: Verbal consent given by patient, and correct patient, procedure, and site were identified prior to beginning the procedure.  Type: Sharp Tool Used: scalpel Debridement depth beyond dead/damaged tissue down to healthy viable tissue: yes Deepest layer of tissue removed: subcutaneous tissue    Nature of tissue removed: Devitalized Tissue and Non-viable tissue Irrigation fluid type: Normal Saline Irrigation volume (mL): 5  Blood Loss: minimal   Post-Procedure: Size After (cm): L 0.6 x W 0.5 x D 0.3    Plan:  Continue Silvadene  dressing changes daily to the left hallux.  Offloading bilaterally. Monitor for any clinical signs or symptoms of infection and directed to call the office immediately should any occur or go to the ER.  He is could be going back to [place]  next week.  Discussed with him trying to be established with a podiatrist in [place] , will try to work on a referral.  No follow-ups on file.  Donnice JONELLE Fees DPM

## 2024-07-09 NOTE — Progress Notes (Signed)
 "  Assessment: 1. BPH with obstruction/lower urinary tract symptoms   2. Hypogonadism in male     Plan: Continue alfuzosin  10 mg daily.   He is interested in oral therapy for his hypogonadism. Prescription for Tlando  225 mg BID sent to [facility] Return to office in 1 month for testosterone  level - draw 8-9 hours after AM dose. Will contact him with results Return to office in 6 months  Chief Complaint:  Chief Complaint  Patient presents with   Benign Prostatic Hypertrophy    History of Present Illness:  [name] is a 69 y.o. male who is seen for further evaluation of BPH with LUTS. At his initial visit in September 2025, he reported a several year history of lower urinary tract symptoms.  His symptoms included frequency, nocturia x 2, sensation of incomplete emptying, intermittent stream, and weak stream.  No dysuria or gross hematuria. IPSS = 17/3. He previously was on tamsulosin  but did not see improvement in his symptoms.  He reported stopping this approximately 6 months ago.  He did not see a change in his symptoms with discontinuation of the medication. He was seen by Dr. Shane about [facility] in January 2025. PVR = 255 mL Further evaluation with cystoscopy and prostate ultrasound recommended.  PSA levels have been normal. His last PSA from 5/25 was 1.31.  He was recently found to have low testosterone .  He was having symptoms of lack of energy and lack of muscle mass.  He also reported some decreased libido. Testosterone  levels: 7/25 191 8/25 242 He was recently started on AndroGel  5 g daily.    He previously underwent cystoscopy 30 years ago for evaluation of microscopic hematuria.  PVR from 9/25 =  431 ml  At his visit in October 2025, he continued on alfuzosin  10 mg daily. He noted improvement in his urinary symptoms with alfuzosin  with an improved stream and improved bladder emptying.  No side effects.  No dysuria or gross  hematuria. IPSS = 13/3. PVR = 138 mL He started using the AndroGel  1% approximately 2 weeks prior to his visit.  He had not seen a change in his symptoms thus far.  Labs from 1/26: Testosterone  160.8  (not on TRT x 1 month) PSA  1.23  He returns today for follow-up.  He continues on alfuzosin  10 mg daily.  His lower urinary tract symptoms are improved.  He continues to have some frequency and occasional weak stream.  No dysuria or gross hematuria. IPSS = 8/3. He has not been using the topical testosterone  for at least a month.  He reported some improvement in his symptoms after using the topical testosterone  for a couple of weeks.  He did feel some increased irritability.  Since stopping the medication, he has had a return of his hypogonadal symptoms.  Portions of the above documentation were copied from a prior visit for review purposes only.   Past Medical History:  Past Medical History:  Diagnosis Date   Abdominal aortic aneurysm (AAA), 30-34 mm diameter 06/01/2017   Nml w/ greatest dm 3 cm US  Aorta 06/2016, Abd US  05/01/2017 showed infrarenal dilatation at 3.4 cm - rec repeat US  in 3 yrs.   Allergy    pollen   Arthritis    Cataract    removed,bilateral   Cervical disc disease 12/26/2011   Coronary artery disease involving native coronary artery of native heart without angina pectoris 12/01/2016   He had coronary angiography in 2011. There  was irregularity within the LAD with up to 30% narrowing. The myocardial perfusion imaging done 11/07/2015 did not demonstrate any evidence of ischemia with a low risk nuclear stress test other than EF estimated at 47%. Chest CT 01/25/2016 showed diffuse coronary artery calcifications with heavy calcifications in the LAD. Pt seen by cardiology Dr. VEAR Sharps    Diabetes mellitus    Difficult intubation 04/05/2014   Dyslipidemia    Family history of anesthesia complication    pt states took days for his father to awaken after mask was used     Fatty liver 06/01/2017   US  05/01/2017   GERD (gastroesophageal reflux disease)    has been having acid reflux since recent endoscopy    H/O hiatal hernia    repair with lap band, now has ventral hernia midline abd   Hearing loss-aides 03/26/2012   Hyperlipidemia    Hypertension    Lapband APS + hiatal hernia repair Oct 2015 04/05/2014   Morbid obesity (HCC)    OSA (obstructive sleep apnea)    Rotator cuff tear    Seasonal allergies    Shortness of breath    pt states related to high BP meds with extended walking or climbling stairs   Sleep apnea     Past Surgical History:  Past Surgical History:  Procedure Laterality Date   BIOPSY  09/20/2022   Procedure: BIOPSY;  Surgeon: Wilhelmenia Aloha Raddle., MD;  Location: [facility];  Service: Gastroenterology;;   CARDIAC CATHETERIZATION     approx 3 years ago    COLONOSCOPY WITH PROPOFOL  N/A 03/10/2015   Procedure: COLONOSCOPY WITH PROPOFOL ;  Surgeon: Toribio SHAUNNA Cedar, MD;  Location: [facility];  Service: Endoscopy;  Laterality: N/A;   COLONOSCOPY WITH PROPOFOL  N/A 09/20/2022   Procedure: COLONOSCOPY WITH PROPOFOL ;  Surgeon: Wilhelmenia Aloha Raddle., MD;  Location: [facility];  Service: Gastroenterology;  Laterality: N/A;   colonscopy      2012   CORONARY ATHERECTOMY N/A 07/18/2021   Procedure: CORONARY ATHERECTOMY;  Surgeon: Dann Candyce RAMAN, MD;  Location: [facility] INVASIVE CV LAB;  Service: Cardiovascular;  Laterality: N/A;   CORONARY STENT INTERVENTION N/A 07/18/2021   Procedure: CORONARY STENT INTERVENTION;  Surgeon: Dann Candyce RAMAN, MD;  Location: [facility] INVASIVE CV LAB;  Service: Cardiovascular;  Laterality: N/A;   ESOPHAGOGASTRODUODENOSCOPY (EGD) WITH PROPOFOL  N/A 03/10/2015   Procedure: ESOPHAGOGASTRODUODENOSCOPY (EGD) WITH PROPOFOL ;  Surgeon: Toribio SHAUNNA Cedar, MD;  Location: [facility];  Service: Endoscopy;  Laterality: N/A;   HERNIA REPAIR     with gastric banding   LAPAROSCOPIC GASTRIC BANDING N/A 04/05/2014   Procedure:  LAPAROSCOPIC GASTRIC BANDING;  Surgeon: Donnice KATHEE Lunger, MD;  Location: [facility];  Service: General;  Laterality: N/A;   MENISCUS REPAIR Left    left knee torn meniscus   RIGHT/LEFT HEART CATH AND CORONARY ANGIOGRAPHY N/A 07/18/2021   Procedure: RIGHT/LEFT HEART CATH AND CORONARY ANGIOGRAPHY;  Surgeon: Dann Candyce RAMAN, MD;  Location: [facility] INVASIVE CV LAB;  Service: Cardiovascular;  Laterality: N/A;   VASECTOMY      Allergies:  Allergies  Allergen Reactions   Penicillins Nausea Only    Upset stomach    Family History:  Family History  Problem Relation Age of Onset   Clotting disorder Mother        lung clot   Mesothelioma Father    Hypertension Brother    Skin cancer Brother    Colon cancer Neg Hx    Colon polyps Neg Hx    Crohn's disease Neg  Hx    Esophageal cancer Neg Hx    Rectal cancer Neg Hx    Stomach cancer Neg Hx    Ulcerative colitis Neg Hx     Social History:  Social History   Tobacco Use   Smoking status: Former    Current packs/day: 0.00    Average packs/day: 4.0 packs/day for 20.0 years (80.0 ttl pk-yrs)    Types: Cigarettes    Start date: 07/19/1973    Quit date: 12/13/1991    Years since quitting: 32.5    Passive exposure: Never   Smokeless tobacco: Never  Vaping Use   Vaping status: Never Used  Substance Use Topics   Alcohol use: Yes    Alcohol/week: 8.0 standard drinks of alcohol    Types: 8 Glasses of wine per week    Comment: 2 glasses of wine a day   Drug use: Yes    Types: Marijuana    ROS: Constitutional:  Negative for fever, chills, weight loss CV: Negative for chest pain, previous MI, hypertension Respiratory:  Negative for shortness of breath, wheezing, sleep apnea, frequent cough GI:  Negative for nausea, vomiting, bloody stool, GERD   Physical exam: BP 96/64   Pulse 92   Ht 6' (1.829 m)   Wt 272 lb (123.4 kg)   BMI 36.89 kg/m  GENERAL APPEARANCE:  Well appearing, well developed, well nourished, NAD HEENT:  Atraumatic,  normocephalic, oropharynx clear NECK:  Supple without lymphadenopathy or thyromegaly ABDOMEN:  Soft, non-tender, no masses EXTREMITIES:  Moves all extremities well, without clubbing, cyanosis, or edema NEUROLOGIC:  Alert and oriented x 3, normal gait, CN II-XII grossly intact MENTAL STATUS:  appropriate BACK:  Non-tender to palpation, No CVAT SKIN:  Warm, dry, and intact   Results: U/A: 0-5 WBCs, 0-2 RBCs "

## 2024-07-10 ENCOUNTER — Ambulatory Visit: Payer: Self-pay | Admitting: Family Medicine

## 2024-07-10 NOTE — Progress Notes (Signed)
 A1c is stable at 6.7.  We should continue with the treatment plan we discussed at his office visit and recheck this again in about 3 months.  His LDL is very well-controlled at 35.  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  He can discuss replacement options with his urologist though it looks like they want to recheck this again in about a month or so.  All of his other labs are at goal and we can recheck in 6 to 12 months.

## 2024-07-13 ENCOUNTER — Encounter: Payer: Self-pay | Admitting: Podiatry

## 2024-07-15 ENCOUNTER — Encounter: Payer: Self-pay | Admitting: Urology

## 2024-07-15 ENCOUNTER — Telehealth: Payer: Self-pay

## 2024-07-15 NOTE — Telephone Encounter (Signed)
 Pt denied Tlando , see under media.

## 2024-07-21 ENCOUNTER — Encounter: Payer: Self-pay | Admitting: Physician Assistant

## 2024-07-21 ENCOUNTER — Ambulatory Visit: Payer: Self-pay

## 2024-07-21 ENCOUNTER — Encounter

## 2024-07-24 ENCOUNTER — Other Ambulatory Visit: Payer: Self-pay | Admitting: Family Medicine

## 2024-09-10 ENCOUNTER — Encounter: Admitting: Physician Assistant

## 2024-09-10 ENCOUNTER — Encounter

## 2025-01-28 ENCOUNTER — Ambulatory Visit
